# Patient Record
Sex: Female | Born: 1960
Health system: Southern US, Community
[De-identification: ages and names within clinical notes are randomized; demographics above are authoritative.]

## PROBLEM LIST (undated history)

## (undated) DIAGNOSIS — F32A Depression, unspecified: Secondary | ICD-10-CM

## (undated) DIAGNOSIS — I10 Essential (primary) hypertension: Secondary | ICD-10-CM

## (undated) DIAGNOSIS — E079 Disorder of thyroid, unspecified: Secondary | ICD-10-CM

## (undated) DIAGNOSIS — M1990 Unspecified osteoarthritis, unspecified site: Secondary | ICD-10-CM

## (undated) DIAGNOSIS — I639 Cerebral infarction, unspecified: Secondary | ICD-10-CM

## (undated) DIAGNOSIS — K635 Polyp of colon: Secondary | ICD-10-CM

## (undated) DIAGNOSIS — E039 Hypothyroidism, unspecified: Secondary | ICD-10-CM

## (undated) DIAGNOSIS — E785 Hyperlipidemia, unspecified: Secondary | ICD-10-CM

## (undated) DIAGNOSIS — K2970 Gastritis, unspecified, without bleeding: Secondary | ICD-10-CM

## (undated) DIAGNOSIS — Z1371 Encounter for nonprocreative screening for genetic disease carrier status: Secondary | ICD-10-CM

## (undated) DIAGNOSIS — C801 Malignant (primary) neoplasm, unspecified: Secondary | ICD-10-CM

## (undated) DIAGNOSIS — M3500 Sicca syndrome, unspecified: Secondary | ICD-10-CM

## (undated) HISTORY — DX: Sjogren syndrome, unspecified: M35.00

## (undated) HISTORY — DX: Hyperlipidemia, unspecified: E78.5

## (undated) HISTORY — PX: ABDOMINAL HYSTERECTOMY: SHX81

## (undated) HISTORY — DX: Cerebral infarction, unspecified: I63.9

## (undated) HISTORY — DX: Malignant (primary) neoplasm, unspecified: C80.1

## (undated) HISTORY — PX: TONSILLECTOMY: SUR1361

## (undated) HISTORY — DX: Disorder of thyroid, unspecified: E07.9

## (undated) HISTORY — DX: Gastritis, unspecified, without bleeding: K29.70

## (undated) HISTORY — DX: Essential (primary) hypertension: I10

## (undated) HISTORY — PX: BREAST CYST ASPIRATION: SHX578

## (undated) HISTORY — DX: Polyp of colon: K63.5

---

## 1999-02-26 ENCOUNTER — Other Ambulatory Visit: Admission: RE | Admit: 1999-02-26 | Discharge: 1999-02-26 | Payer: Self-pay | Admitting: Obstetrics & Gynecology

## 1999-06-05 ENCOUNTER — Ambulatory Visit (HOSPITAL_COMMUNITY): Admission: RE | Admit: 1999-06-05 | Discharge: 1999-06-05 | Payer: Self-pay | Admitting: Obstetrics & Gynecology

## 2000-11-23 ENCOUNTER — Other Ambulatory Visit: Admission: RE | Admit: 2000-11-23 | Discharge: 2000-11-23 | Payer: Self-pay | Admitting: Obstetrics & Gynecology

## 2000-11-30 ENCOUNTER — Emergency Department (HOSPITAL_COMMUNITY): Admission: EM | Admit: 2000-11-30 | Discharge: 2000-11-30 | Payer: Self-pay | Admitting: Emergency Medicine

## 2000-11-30 ENCOUNTER — Encounter: Payer: Self-pay | Admitting: Emergency Medicine

## 2001-04-20 ENCOUNTER — Encounter: Admission: RE | Admit: 2001-04-20 | Discharge: 2001-04-20 | Payer: Self-pay | Admitting: Obstetrics & Gynecology

## 2001-04-20 ENCOUNTER — Encounter: Payer: Self-pay | Admitting: Obstetrics & Gynecology

## 2001-10-28 ENCOUNTER — Encounter: Admission: RE | Admit: 2001-10-28 | Discharge: 2001-10-28 | Payer: Self-pay | Admitting: Obstetrics & Gynecology

## 2001-10-28 ENCOUNTER — Encounter: Payer: Self-pay | Admitting: Obstetrics & Gynecology

## 2001-11-30 ENCOUNTER — Other Ambulatory Visit: Admission: RE | Admit: 2001-11-30 | Discharge: 2001-11-30 | Payer: Self-pay | Admitting: Obstetrics & Gynecology

## 2002-07-12 ENCOUNTER — Emergency Department (HOSPITAL_COMMUNITY): Admission: EM | Admit: 2002-07-12 | Discharge: 2002-07-13 | Payer: Self-pay | Admitting: Emergency Medicine

## 2002-08-31 ENCOUNTER — Encounter: Payer: Self-pay | Admitting: Obstetrics & Gynecology

## 2002-08-31 ENCOUNTER — Ambulatory Visit (HOSPITAL_COMMUNITY): Admission: RE | Admit: 2002-08-31 | Discharge: 2002-08-31 | Payer: Self-pay | Admitting: Obstetrics & Gynecology

## 2002-09-01 ENCOUNTER — Encounter: Payer: Self-pay | Admitting: Obstetrics & Gynecology

## 2002-09-01 ENCOUNTER — Encounter: Admission: RE | Admit: 2002-09-01 | Discharge: 2002-09-01 | Payer: Self-pay | Admitting: Obstetrics & Gynecology

## 2002-12-05 ENCOUNTER — Encounter: Payer: Self-pay | Admitting: Obstetrics & Gynecology

## 2002-12-05 ENCOUNTER — Encounter: Admission: RE | Admit: 2002-12-05 | Discharge: 2002-12-05 | Payer: Self-pay | Admitting: Obstetrics & Gynecology

## 2002-12-06 ENCOUNTER — Other Ambulatory Visit: Admission: RE | Admit: 2002-12-06 | Discharge: 2002-12-06 | Payer: Self-pay | Admitting: Obstetrics & Gynecology

## 2002-12-21 ENCOUNTER — Encounter (INDEPENDENT_AMBULATORY_CARE_PROVIDER_SITE_OTHER): Payer: Self-pay | Admitting: *Deleted

## 2002-12-21 ENCOUNTER — Encounter (INDEPENDENT_AMBULATORY_CARE_PROVIDER_SITE_OTHER): Payer: Self-pay | Admitting: Specialist

## 2002-12-21 ENCOUNTER — Inpatient Hospital Stay (HOSPITAL_COMMUNITY): Admission: RE | Admit: 2002-12-21 | Discharge: 2002-12-24 | Payer: Self-pay | Admitting: Obstetrics & Gynecology

## 2004-03-04 ENCOUNTER — Encounter: Admission: RE | Admit: 2004-03-04 | Discharge: 2004-03-04 | Payer: Self-pay | Admitting: Obstetrics & Gynecology

## 2004-06-11 ENCOUNTER — Emergency Department (HOSPITAL_COMMUNITY): Admission: EM | Admit: 2004-06-11 | Discharge: 2004-06-11 | Payer: Self-pay | Admitting: Emergency Medicine

## 2004-06-11 IMAGING — CR DG ABDOMEN ACUTE W/ 1V CHEST
3 series · 3 of 3 positions shown · non-contrast
Comparison: None

CLINICAL DATA: Abdominal pain, nausea, and vomiting

ABDOMEN SERIES - 2 VIEW & CHEST - 1 VIEW

[view not recorded (1 of 3)]
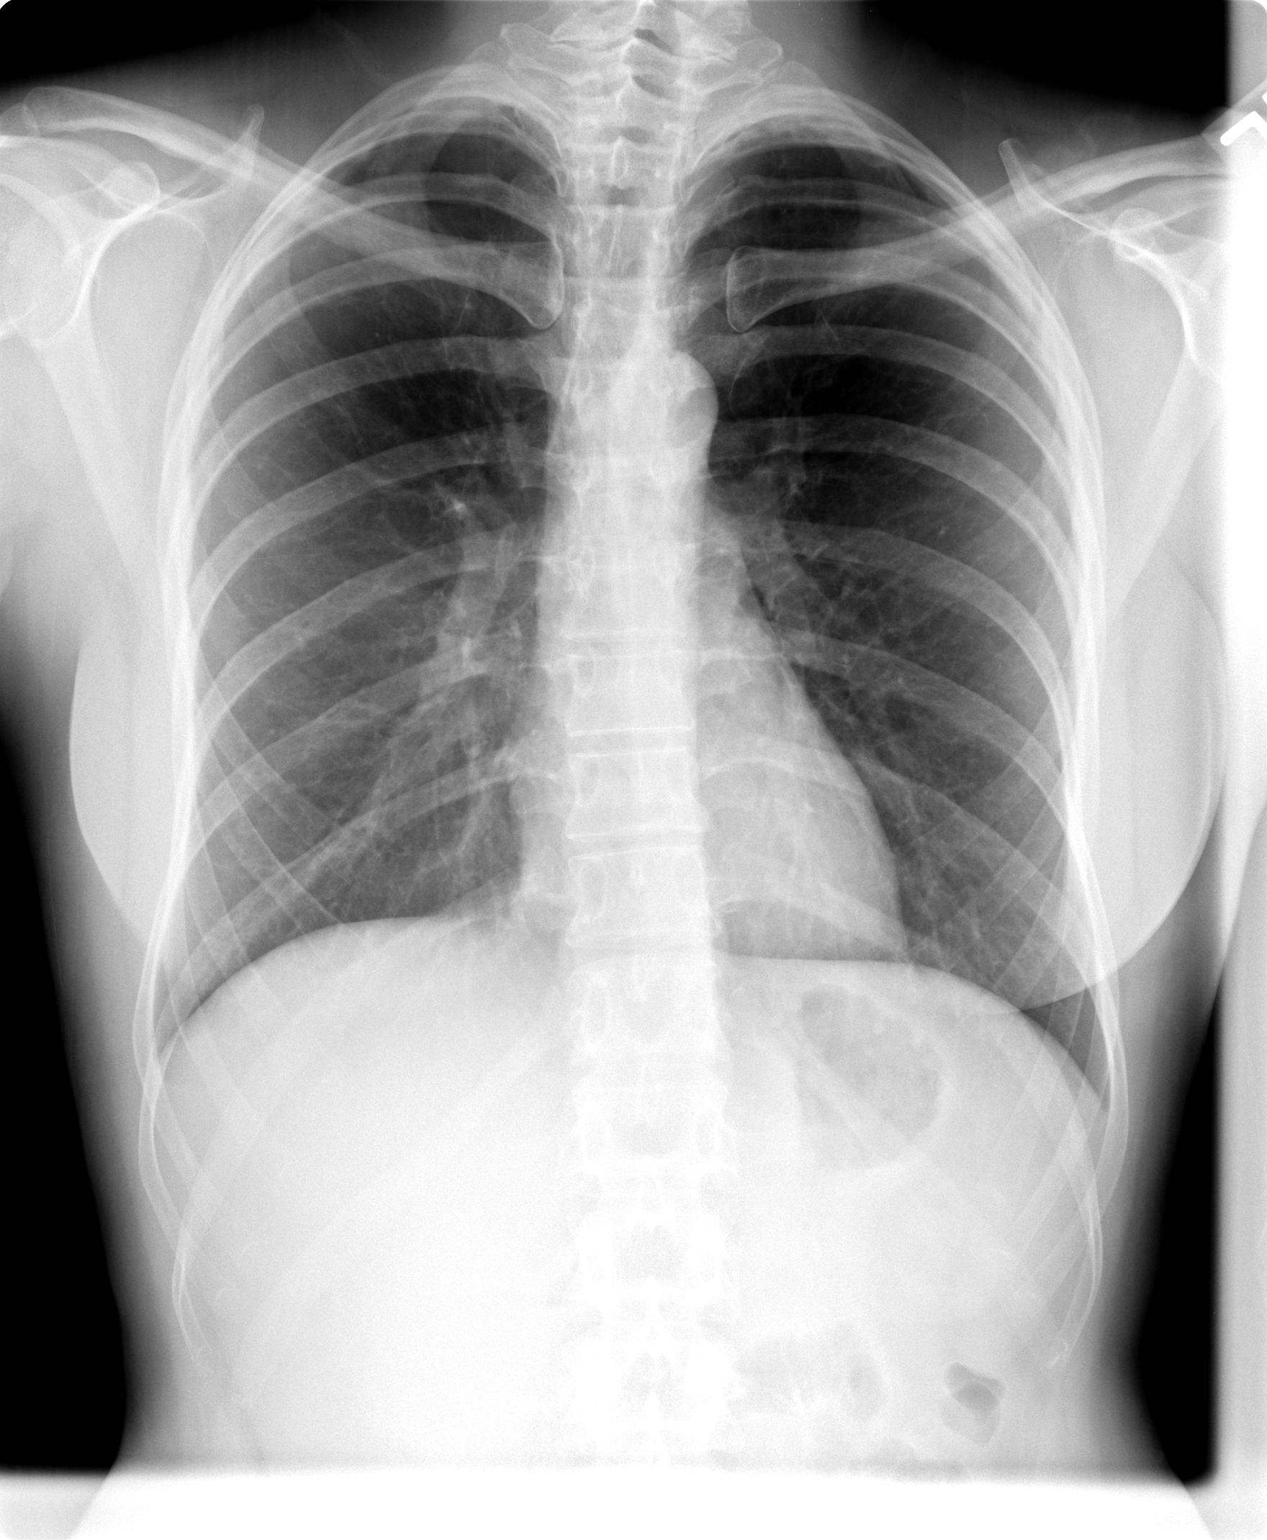

[view not recorded (2 of 3)]
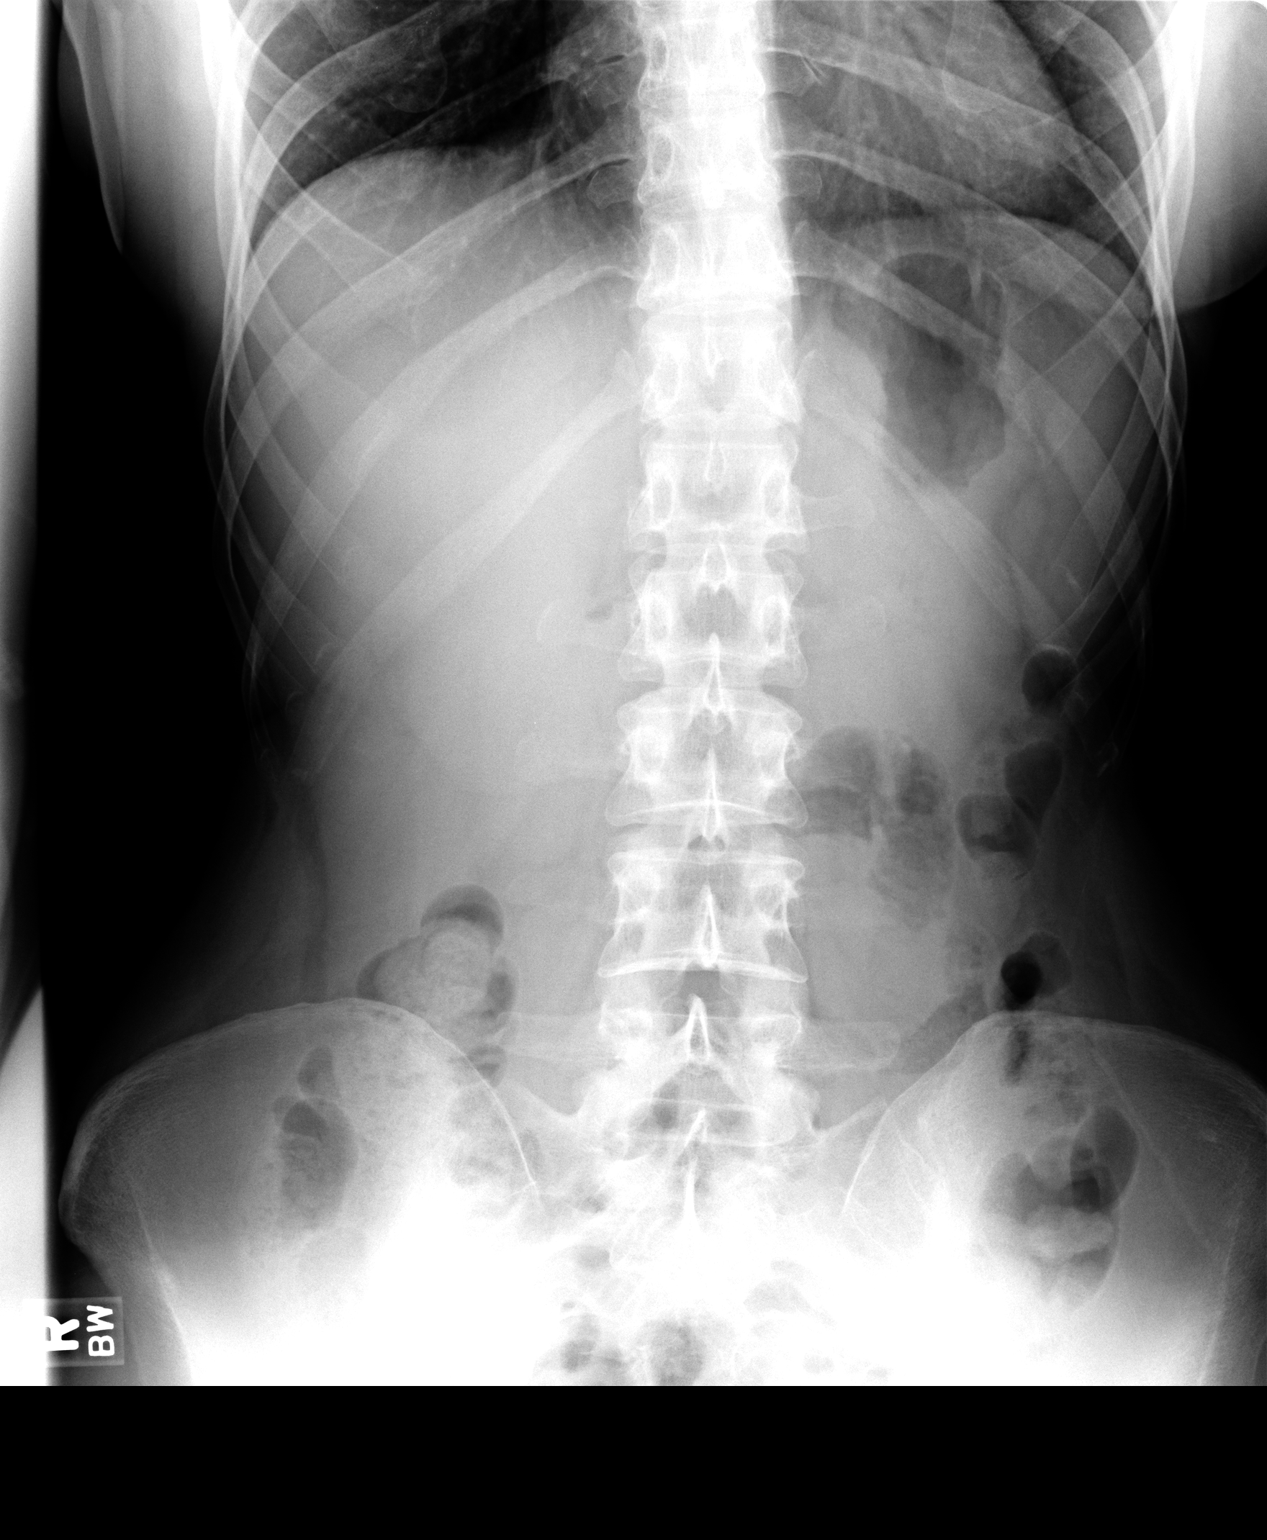

[view not recorded (3 of 3)]
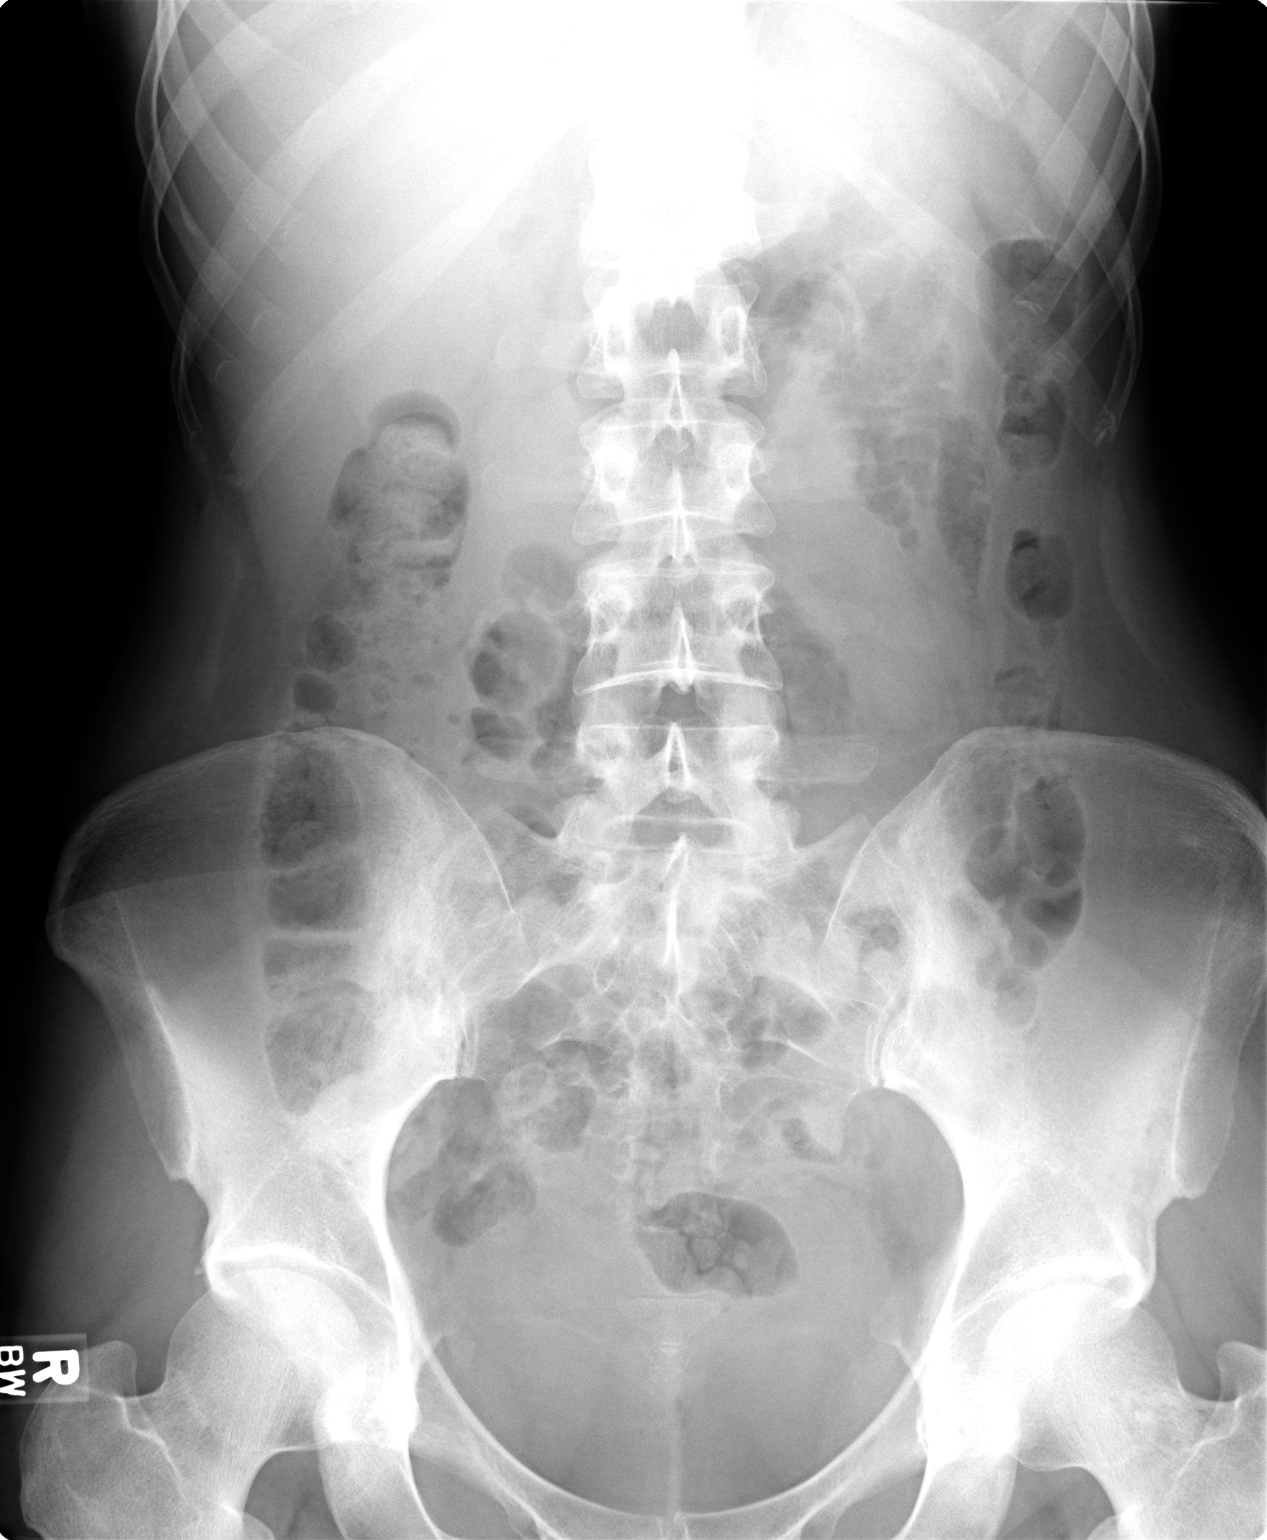

[3 of 3 positions shown; findings below may reference images not displayed]

FINDINGS: Lungs are clear. No acute pneumonia, effusion, edema, pneumothorax. Heart size is
normal. No free air.

Scattered air and stool are evident throughout the bowel. No obstruction. No abnormal
calcifications.

IMPRESSION

1. No acute findings

## 2004-10-24 ENCOUNTER — Encounter: Admission: RE | Admit: 2004-10-24 | Discharge: 2004-10-24 | Payer: Self-pay | Admitting: *Deleted

## 2004-10-24 IMAGING — CR DG FOOT COMPLETE 3+V*R*
2 series · 2 of 2 positions shown · non-contrast
Comparison: none

HISTORY: Right foot pain, question stress fracture

RIGHT FOOT 3 VIEWS:
No fracture, dislocation, or bone destruction.
Joint spaces preserved.  
Mineralization normal.

[view not recorded (1 of 2)]
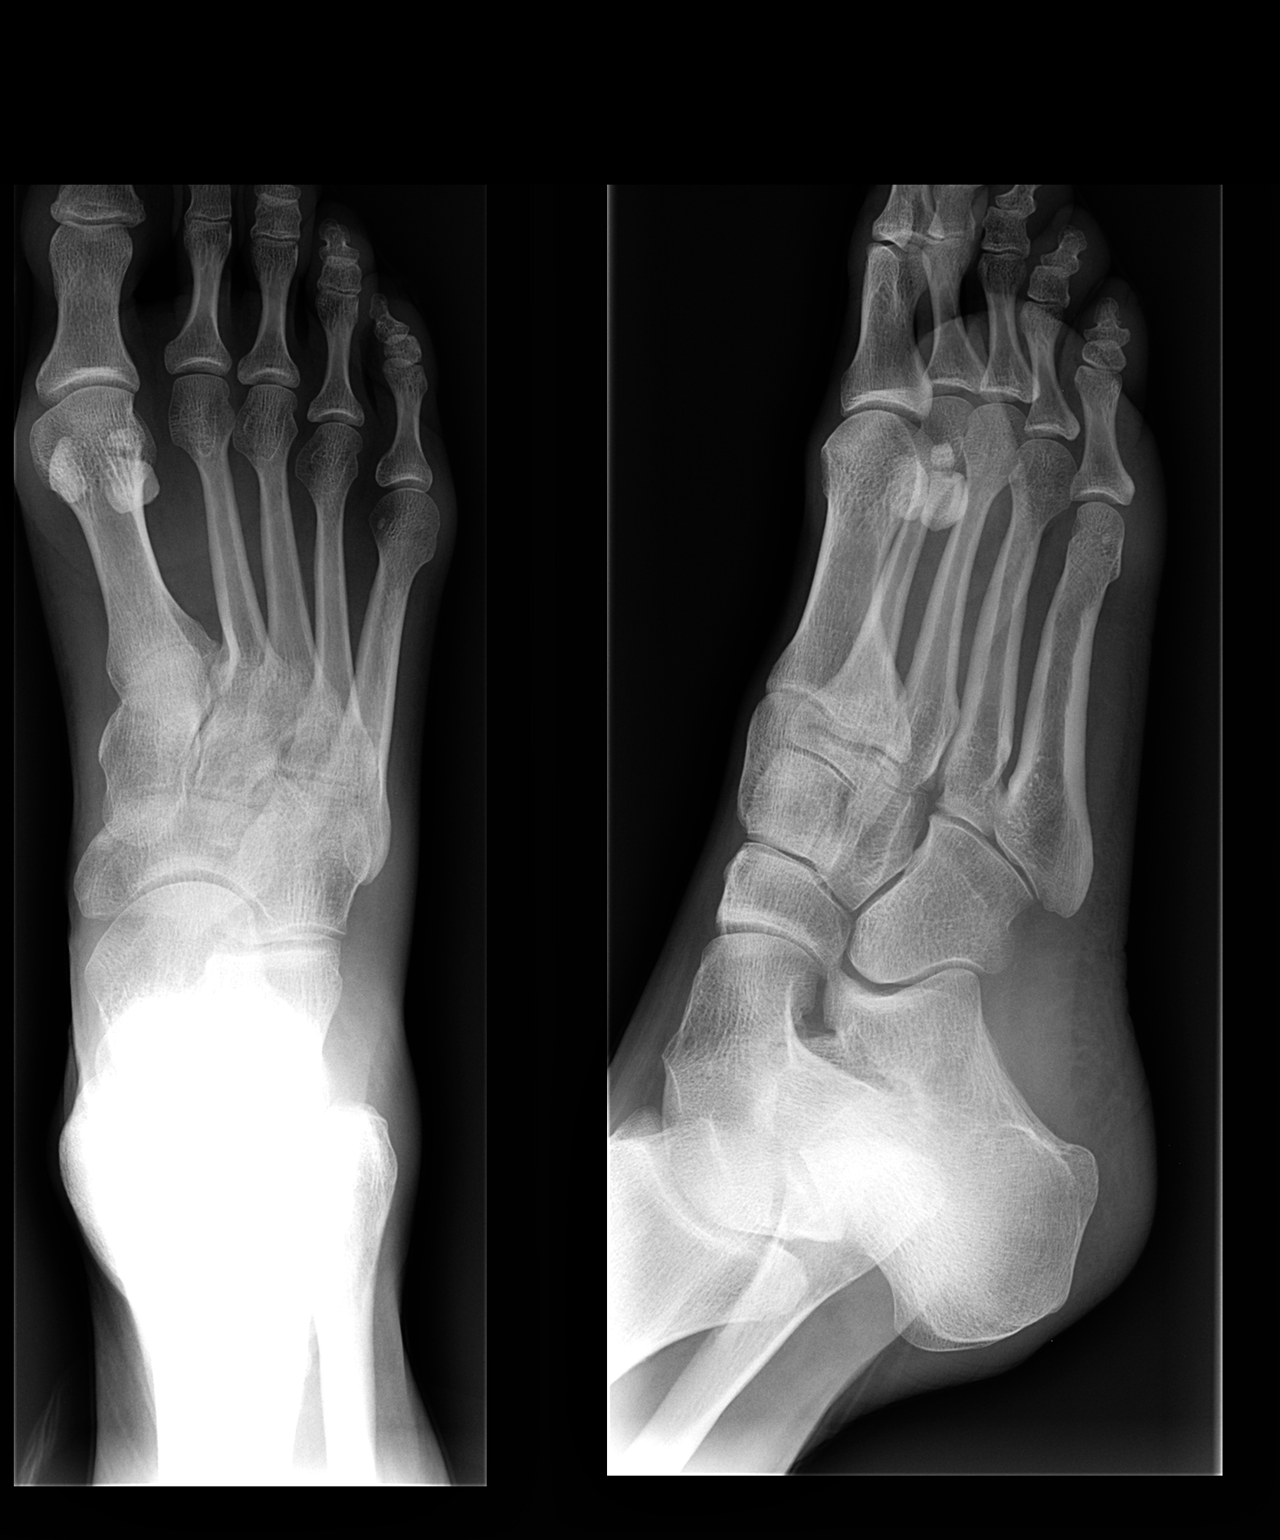

[view not recorded (2 of 2)]
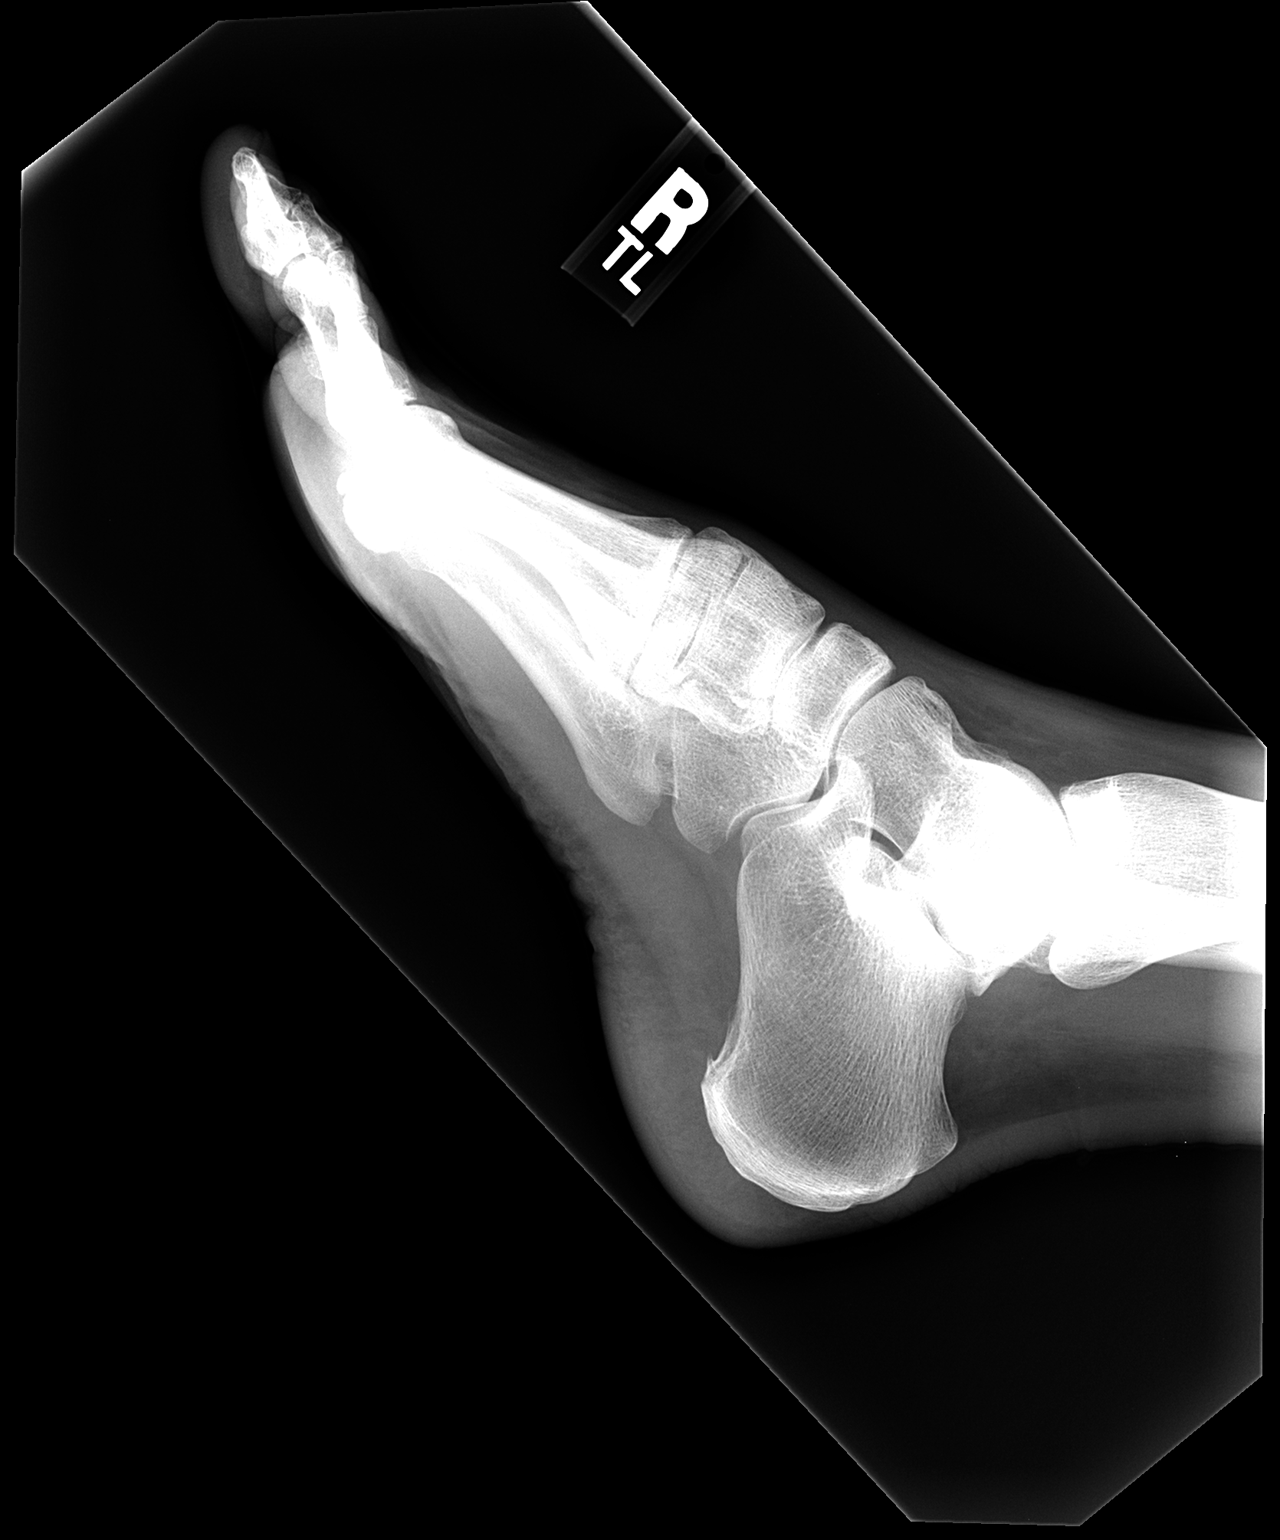

[2 of 2 positions shown; findings below may reference images not displayed]

IMPRESSION: No acute abnormalities.

## 2005-03-10 ENCOUNTER — Encounter: Admission: RE | Admit: 2005-03-10 | Discharge: 2005-03-10 | Payer: Self-pay | Admitting: Obstetrics & Gynecology

## 2005-06-14 HISTORY — PX: COLONOSCOPY: SHX174

## 2005-06-23 ENCOUNTER — Ambulatory Visit (HOSPITAL_COMMUNITY): Admission: RE | Admit: 2005-06-23 | Discharge: 2005-06-23 | Payer: Self-pay | Admitting: Gastroenterology

## 2006-04-01 ENCOUNTER — Encounter: Admission: RE | Admit: 2006-04-01 | Discharge: 2006-04-01 | Payer: Self-pay | Admitting: Obstetrics & Gynecology

## 2006-04-01 IMAGING — MG MM DIAGNOSTIC BILATERAL
5 series · 5 of 5 positions shown · non-contrast
Comparison: [DATE].

DG DIAGNOSTIC BILATERAL
Bilateral CC and MLO view(s) were taken.

RIGHT BREAST ULTRASOUND
DIGITAL BILATERAL  DIAGNOSTIC MAMMOGRAM WITH CAD AND RIGHT BREAST ULTRASOUND:
CLINICAL DATA: Questioned palpable finding right breast 2 o'clock.

[R CC]
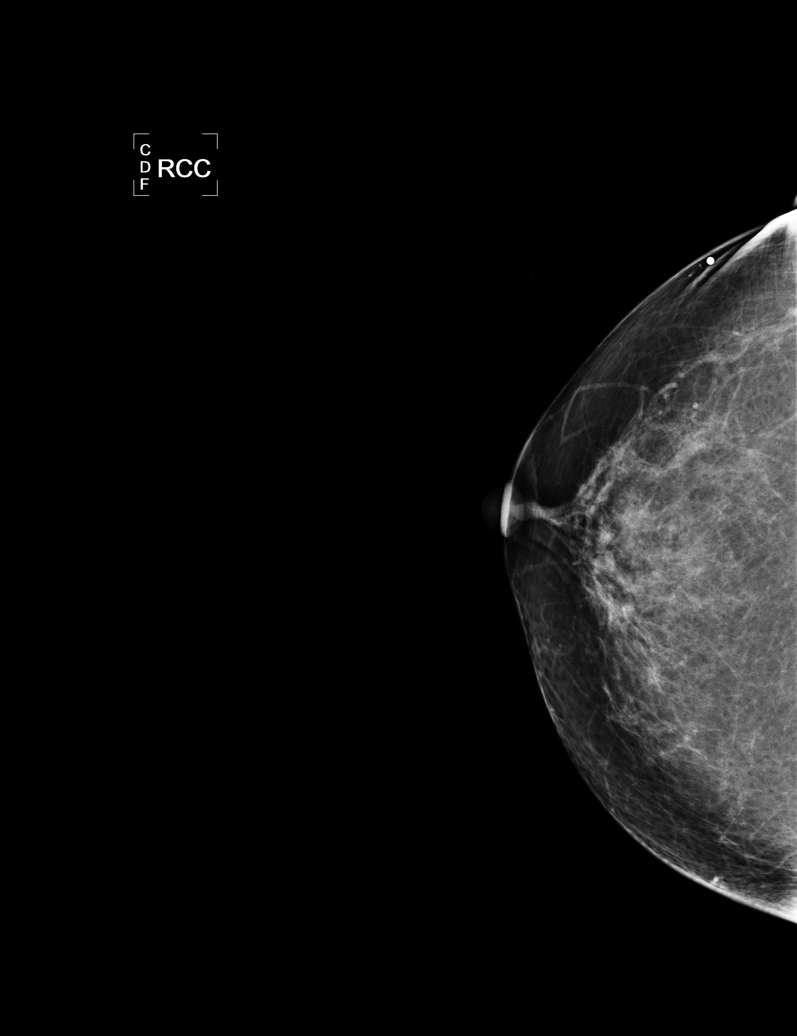

[L CC]
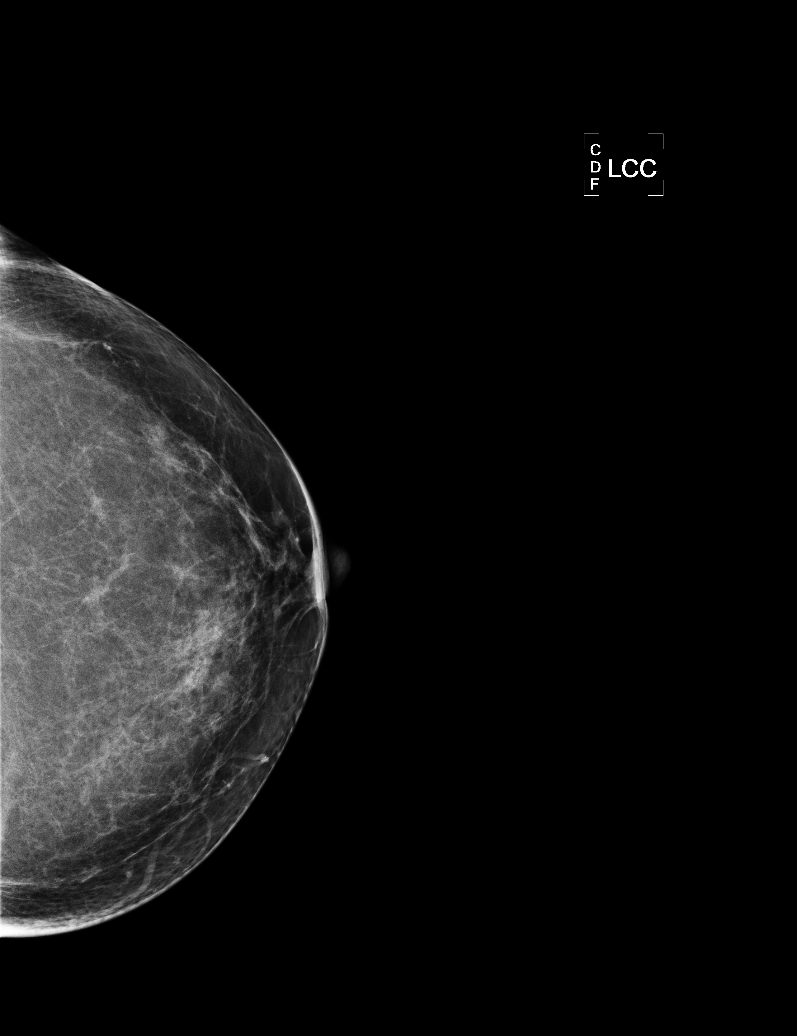

[L MLO]
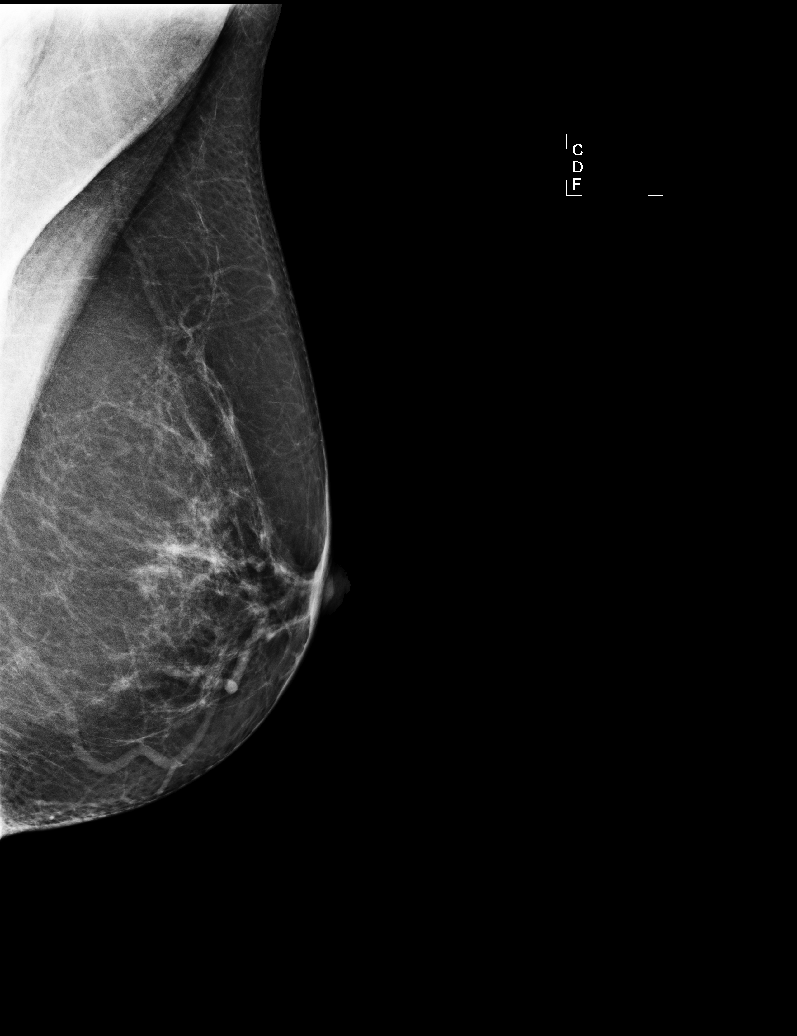

[R MLO]
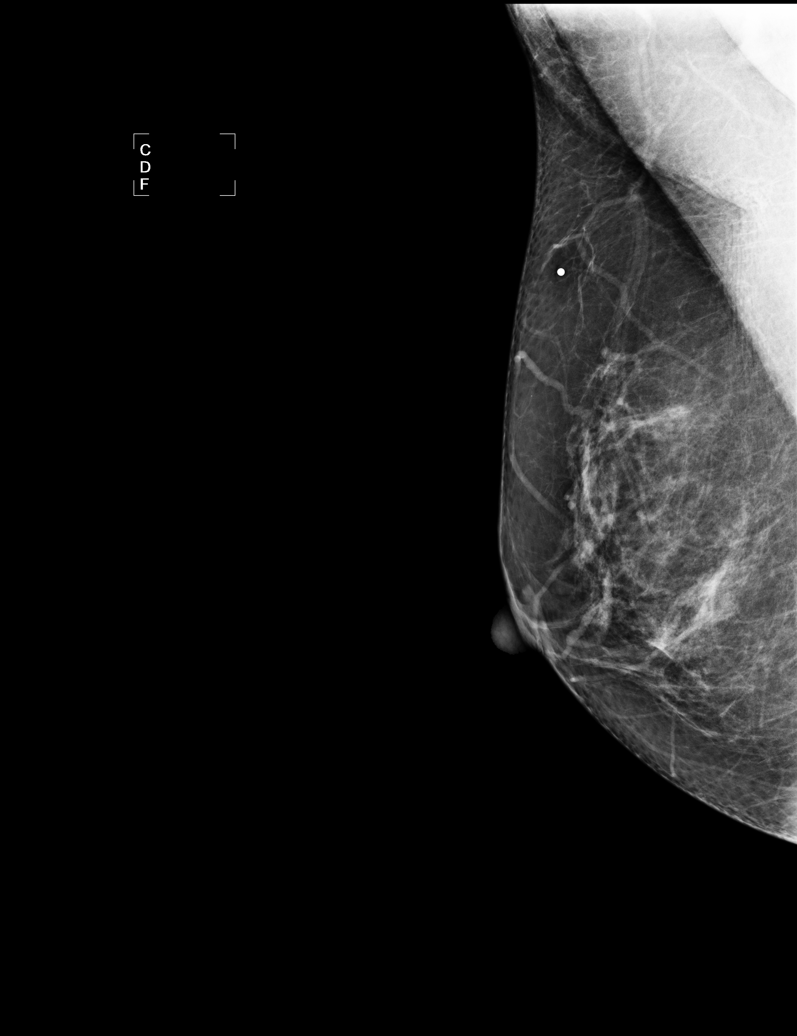

[R TAN]
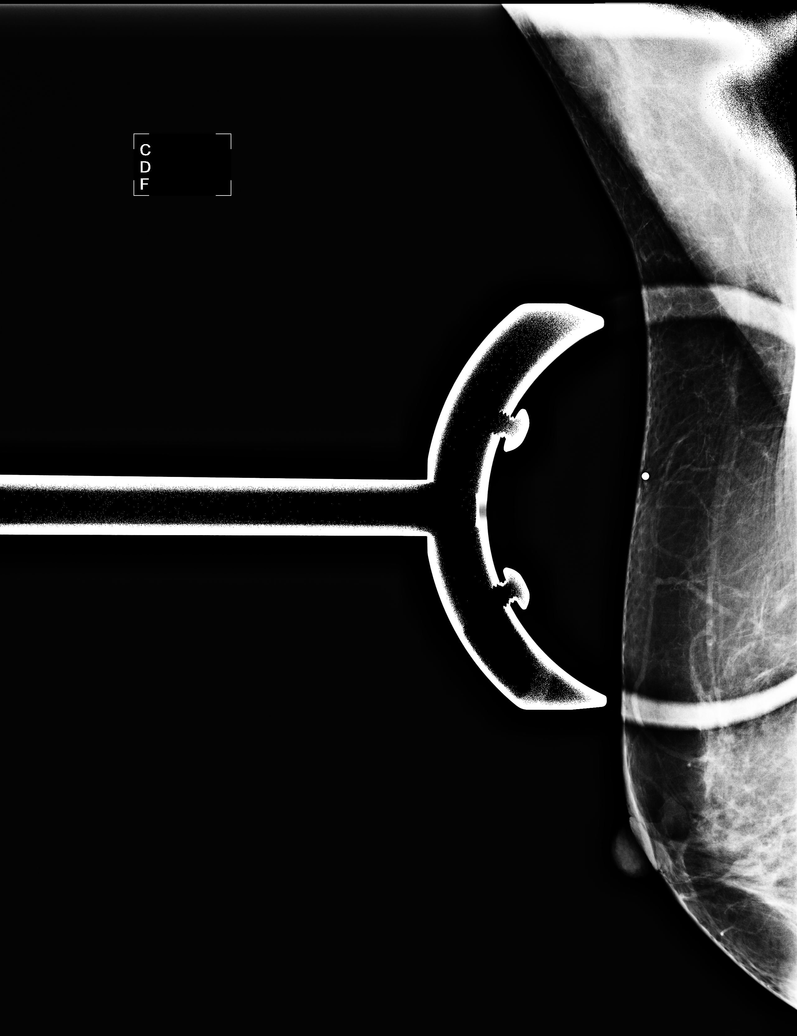

[5 of 5 positions shown; findings below may reference images not displayed]

The breast parenchyma demonstrates scattered fibroglandular densities.  No suspicious mass, 
calcification, or architectural distortion is seen.

On physical examination, I palpate a ridge of normal-appearing tissue in the right upper outer 
quadrant 2 o'clock location which appears normal on ultrasound.
IMPRESSION: No mammographic or sonographic evidence for malignancy in the area of the questioned palpable 
finding right breast 2 o'clock location.  The patient will be due for screening mammography in one 
year.  Findings and recommendations discussed with the patient and provided in written form at the 
time of the study.

ASSESSMENT: Negative - BI-RADS 1

Routine screening mammogram in 1 year.
,

## 2007-08-09 ENCOUNTER — Ambulatory Visit (HOSPITAL_COMMUNITY): Admission: RE | Admit: 2007-08-09 | Discharge: 2007-08-09 | Payer: Self-pay | Admitting: Obstetrics & Gynecology

## 2008-03-20 ENCOUNTER — Ambulatory Visit: Payer: Self-pay | Admitting: Family Medicine

## 2008-04-26 ENCOUNTER — Ambulatory Visit (HOSPITAL_COMMUNITY): Admission: RE | Admit: 2008-04-26 | Discharge: 2008-04-26 | Payer: Self-pay | Admitting: Family Medicine

## 2009-05-29 ENCOUNTER — Ambulatory Visit: Payer: Self-pay | Admitting: Family Medicine

## 2009-06-05 ENCOUNTER — Ambulatory Visit (HOSPITAL_COMMUNITY): Admission: RE | Admit: 2009-06-05 | Discharge: 2009-06-05 | Payer: Self-pay | Admitting: Obstetrics & Gynecology

## 2010-07-03 ENCOUNTER — Ambulatory Visit (HOSPITAL_COMMUNITY): Admission: RE | Admit: 2010-07-03 | Discharge: 2010-07-03 | Payer: Self-pay | Admitting: Obstetrics & Gynecology

## 2010-07-03 IMAGING — MG MM DIGITAL SCREENING
2 series · 2 of 2 positions shown · non-contrast
Comparison: Prior studies.

DG SCREEN MAMMOGRAM BILATERAL
Bilateral CC and MLO view(s) were taken.
Technologist: DELTON.(DELTON)(DELTON)

DIGITAL SCREENING MAMMOGRAM WITH CAD:

[R CC]
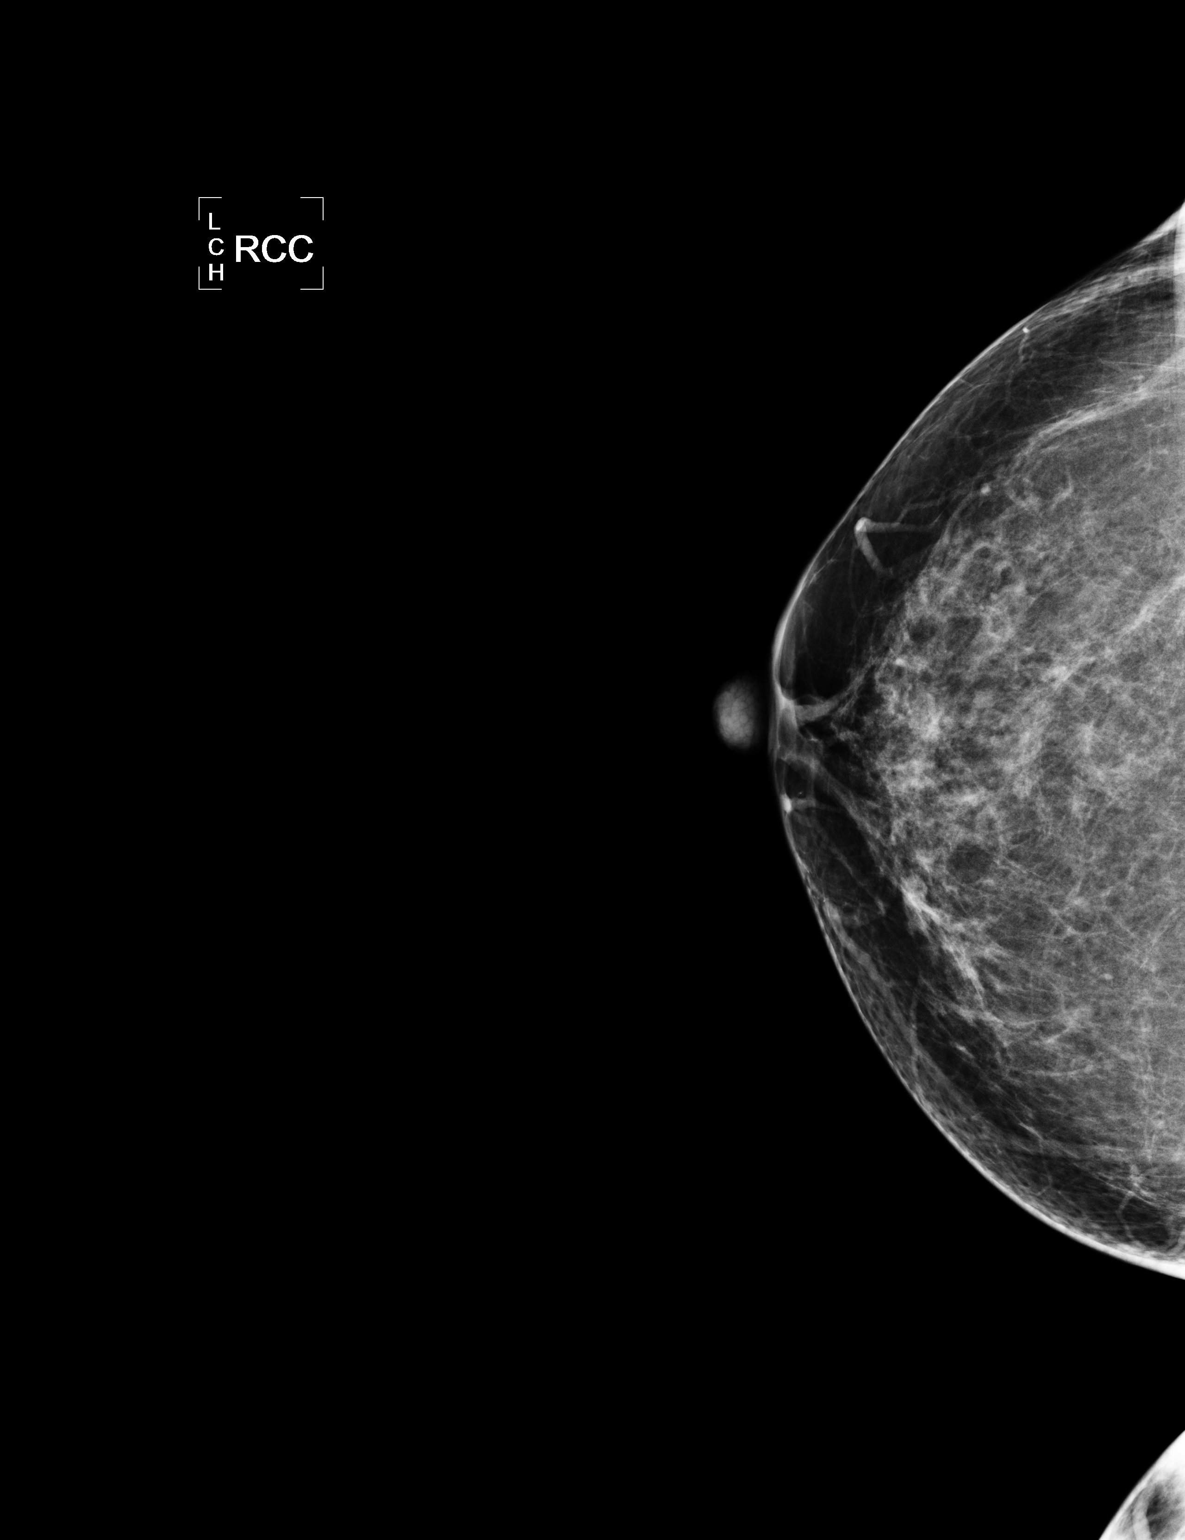

[L CC]
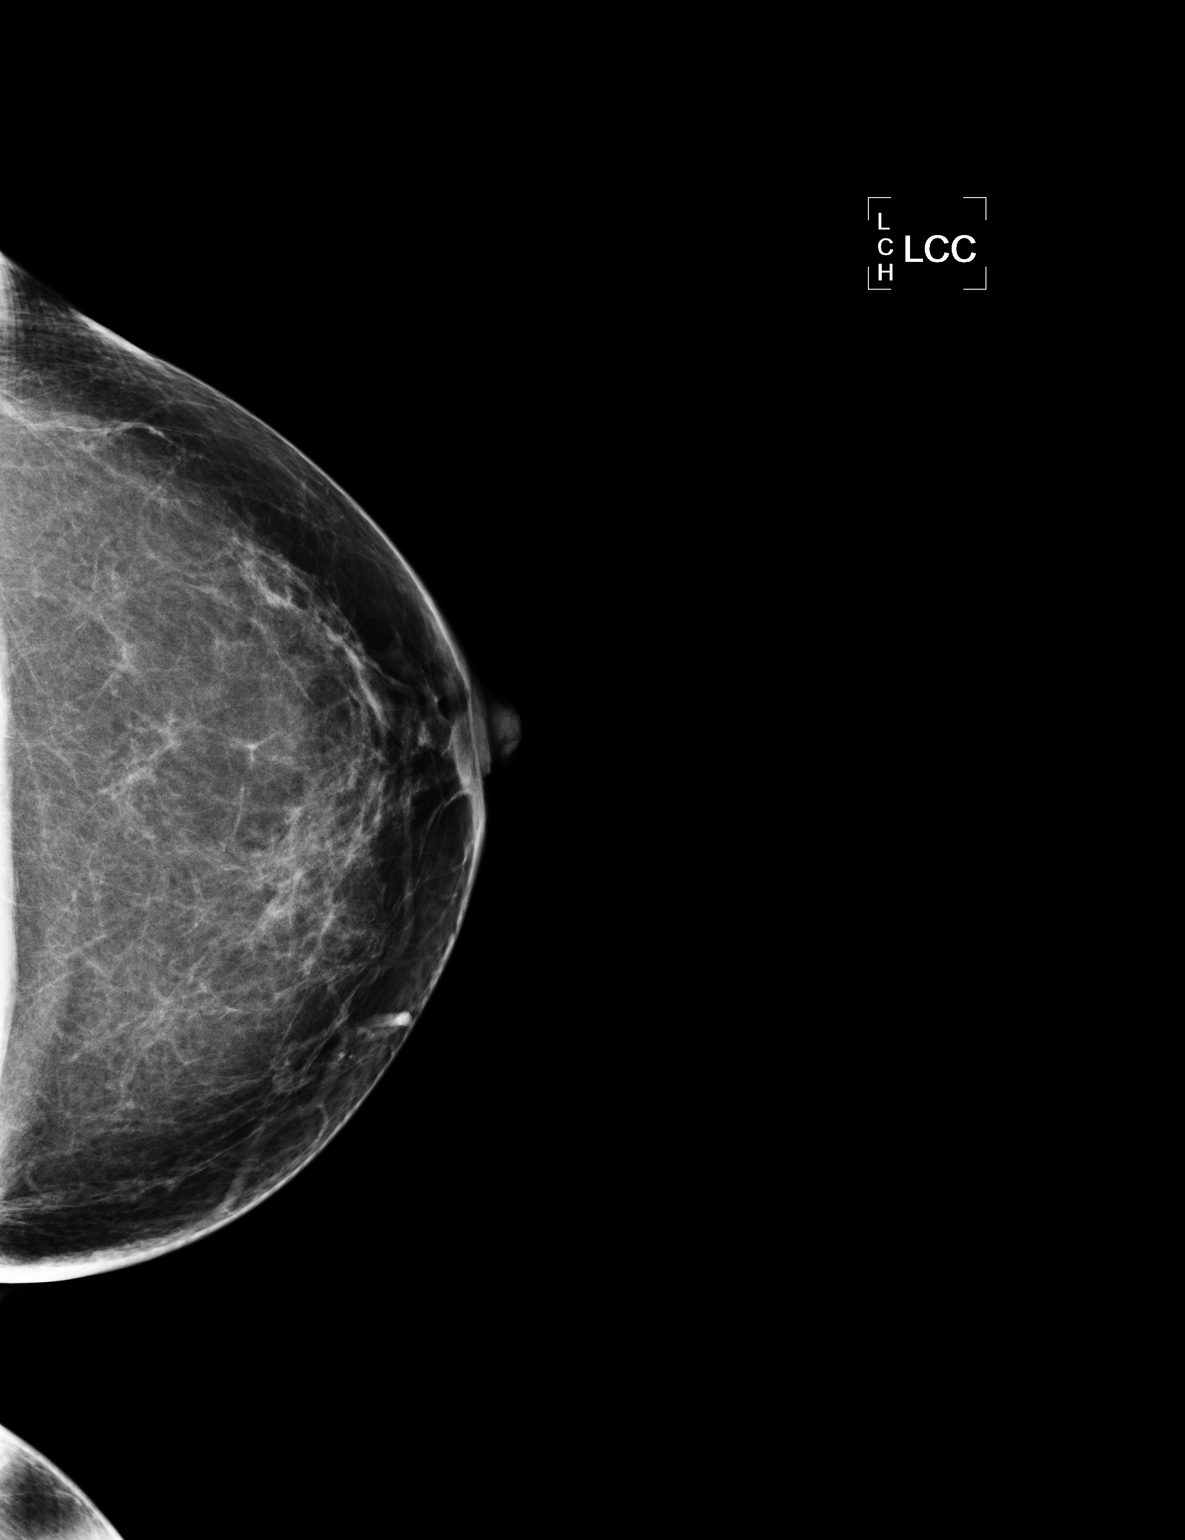

[2 of 2 positions shown; findings below may reference images not displayed]

There are scattered fibroglandular densities.  There is no dominant mass, architectural distortion 
or calcification to suggest malignancy.

Images were processed with CAD.
IMPRESSION: No mammographic evidence of malignancy.  Suggest yearly screening mammography.

A result letter of this screening mammogram will be mailed directly to the patient.

ASSESSMENT: Negative - BI-RADS 1

Screening mammogram in 1 year.
,

## 2010-10-04 ENCOUNTER — Encounter: Payer: Self-pay | Admitting: Obstetrics & Gynecology

## 2010-11-27 ENCOUNTER — Ambulatory Visit (INDEPENDENT_AMBULATORY_CARE_PROVIDER_SITE_OTHER): Payer: Commercial Managed Care - PPO | Admitting: Family Medicine

## 2010-11-27 DIAGNOSIS — J209 Acute bronchitis, unspecified: Secondary | ICD-10-CM

## 2011-01-30 NOTE — Discharge Summary (Signed)
NAME:  Dorothy Wood, Dorothy Wood                        ACCOUNT NO.:  [id number]   MEDICAL RECORD NO.:  [id number]                   PATIENT TYPE:  INP   LOCATION:  9137                                 FACILITY:  [facility]   PHYSICIAN:  Gerrit Friends. Aldona Bar, M.D.                DATE OF BIRTH:  1961-06-07   DATE OF ADMISSION:  12/21/2002  DATE OF DISCHARGE:  12/24/2002                                 DISCHARGE SUMMARY   DISCHARGE DIAGNOSES:  Strong suspicion for early ovarian cancer.   PROCEDURE:  Total abdominal hysterectomy, bilateral salpingo-oophorectomy  and peritoneal washings through a midline incision.   HISTORY OF PRESENT ILLNESS:  This 50 year old patient presented with  abdominal pain in December of 2003. Evaluation at that time consisted of an  ultrasound which was normal and a CA125 which was slightly elevated. The  CA125 was repeated six weeks later and was even more slightly elevated. The  value had gone from 34 to 39. At this time, the patient's examination was  within normal limits but her abdominal symptoms seemed to come and go. She  was seen in March of 2004 and her examination at that time was normal.  However, her CA125 was now 5. After discussion with several oncologists,  the decision was made to proceed with total abdominal hysterectomy,  bilateral salpingo-oophorectomy and peritoneal washings and possible  omentectomy with a preoperative diagnosis and concern for a very early  ovarian cancer.   HOSPITAL COURSE:  The patient was taken to the OR after a normal  preoperative workup on December 21, 2002 at which time she underwent total  abdominal hysterectomy, bilateral salpingo-oophorectomy and peritoneal  washings. The omentum was clean. Cytology and pathology reports are still  pending at this time. There were no obvious abnormalities seen at the time  of surgery. The patient's postoperative course was relatively uncomplicated.  As expected, she did have an ileus that persisted  until the evening of the  second postoperative day. On the morning of the third postoperative day, she  was ambulating well, tolerating her regular diet well and having normal  bowel and bladder function. She was afebrile, wound was clean and dry and  vital signs were stable. She was deemed ready for discharge.   LABORATORY DATA:  Discharge hemoglobin was 10.6 with white count if 9,400  and platelet count of 179,000.   DISCHARGE MEDICATIONS:  Tylox 1-2 every four to six hours as needed pain,  Zofran 4 mg oral dissolving tablets every four to six hours as needed and __  0.05 mg change twice weekly.   FOLLOW UP:  She will return to the office in approximately two days time for  staple removal and to have her wound steri-stripped. She was given all  careful postoperative instructions at the time of discharge and understood  all instructions well.   CONDITION ON DISCHARGE:  Improved.  Gerrit Friends. Aldona Bar, M.D.    RMW/MEDQ  D:  12/24/2002  T:  12/25/2002  Job:  [id number]

## 2011-01-30 NOTE — Op Note (Signed)
NAME:  Dorothy Wood, Dorothy Wood                        ACCOUNT NO.:  [id number]   MEDICAL RECORD NO.:  [id number]                   PATIENT TYPE:  INP   LOCATION:  9309                                 FACILITY:  [facility]   PHYSICIAN:  Gerrit Friends. Aldona Bar, M.D.                DATE OF BIRTH:  03/18/1961   DATE OF PROCEDURE:  12/21/2002  DATE OF DISCHARGE:                                 OPERATIVE REPORT   PREOPERATIVE DIAGNOSIS:  Elevating CA-125, suspicion for ovarian cancer.   POSTOPERATIVE DIAGNOSIS:  Elevating CA-125, suspicion for ovarian cancer.  Pathology pending.   PROCEDURE:  Exploratory laparotomy through a midline incision, peritoneal  washings, total abdominal hysterectomy, bilateral salpingo-oophorectomy.   SURGEON:  Annamaria Helling, M.D.   ASSISTANT:  Lodema Hong, M.D.   ANESTHESIA:  General endotracheal.   PROCEDURE:  The patient was taken to the operating room where after the  satisfactory induction of general endotracheal anesthesia, the patient was  prepped and draped having been placed in the supine position.  A Foley  catheter was inserted as part of the prep.   Once the patient was adequately draped the procedure was begun.  A midline  incision was made from the area below the umbilicus to the suprapubic area  in the midline.  This incision was dissected down sharply to the fascia with  hemostasis completed at each layer.  The fascia was incised in the midline,  midline identified, posterior sheath grasped and opened and peritoneum  subsequently grasped and opened.  At this time, peritoneal washings were  obtained.  The incision was then extended and opened completely.  The  omentum was inspected thoroughly and noted to be completely normal with no  lesions seen.  Other than some adhesions involving the anterior abdominal  wall and the anterior lower segment of the uterus probably secondary to  patient's previous Cesarean section.  No pelvic or lower abdominal  abnormalities  were seen.  The ovaries appeared normal - there were no  abnormalities noted on the surface of either ovary.  The cul-de-sac was free  of any disease.  The appendix was visualized and noted to be completely  normal.  Exploration of the abdomen found the undersurface of the diaphragm  to be free with no nodularity.  Liver edge was likewise free of any  nodularity.  Both kidneys palpated normally.  At this time, the Balfour  retractor was placed and bowel was packed off without difficulty.  Hysterectomy at this time was begun in the usual fashion.  __________ Tresa Endo  clamps were used to elevate the corners of the uterus.  The adhesions  involving the lower anterior abdominal wall and the anterior lower segment  of the uterus were sharply lysed.  The round ligaments were then identified,  suture-secured and opened, and the anterior bladder flap was dissected off  the lower segment of the uterus without difficulty.  The infundibulopelvic  pedicles were then clamped, cut and suture-secured with 0Vicryl suture -  doubly tied.  This was carried out after identification of each ureter.  This removed essentially both tubes and ovaries.  At this time, uterine  artery vessels were skeletonized and curved Heaney clamps were then used and  the uterine artery pedicles were then suture-secured with 0Vicryl suture.  Using straight Heaney clamps bilaterally in a similar fashion, parametrial  tissue was taken down to the area of the cervix.  At this time the  uterosacral pedicles were clamped, cut, suture-secured as well with 0Vicryl  suture and entrance into the vaginal angle was obtained bilaterally and the  specimen was removed in its entirety including uterus, tubes and ovaries  submitted as one specimen.   At this time the vaginal cuff was closed, first the vaginal angles with  figure-of-eight 0Vicryl and thereafter the vaginal cuff itself with 0Vicryl  in interrupted figure-of-eight fashion.  The  pelvis at this time was  profusely irrigated and hemostasis was created.  Again, the ureters were  noted to be normal in their course.  Good hemostasis was achieved.  At this  time the hysterectomy portion of the procedure was felt to be complete.  All  packs were removed.  Retractor was removed and at this point in the  procedure all counts were noted to be correct and no foreign bodies were  noted to be remaining in the abdominal cavity.  The appendix had been  identified previously and was noted to be normal and was left in.  At this  time with the specimen removed, procedure completed, closure of the abdomen  was begun in layers.  The abdominal peritoneum was closed with 2-0 Vicryl in  a running fashion.  This was done as mentioned after all counts were noted  to be correct.  Closure of the fascia was then carried out in an interrupted  fashion using 0Vicryl suture, some used in a figure-of-eight fashion.  Subcutaneous tissue was then irrigated profusely and closure of the skin was  carried out with staples and a pressure dressing was applied.  The patient  at this time was transported to the recovery room in satisfactory condition  having tolerated the procedure well.  Estimated blood loss 150 cc.  All  counts correct times two.  Specimen includes the uterus, tubes and ovaries  submitted as one specimen and peritoneal washings.  The patient was taken to  the recovery room in satisfactory condition.                                               Gerrit Friends. Aldona Bar, M.D.    RMW/MEDQ  D:  12/21/2002  T:  12/22/2002  Job:  [id number]

## 2011-01-30 NOTE — Op Note (Signed)
Dorothy Wood, Dorothy Wood              ACCOUNT NO.:  [id number]   MEDICAL RECORD NO.:  [id number]          PATIENT TYPE:  AMB   LOCATION:  ENDO                         FACILITY:  [facility]   PHYSICIAN:  Bernette Redbird, M.D.   DATE OF BIRTH:  [birth year]   DATE OF PROCEDURE:  06/23/2005  DATE OF DISCHARGE:                                 OPERATIVE REPORT   PROCEDURE PERFORMED:  Colonoscopy.   ENDOSCOPIST:  Florencia Reasons, M.D.   INDICATIONS FOR PROCEDURE:  Follow-up of a prior colon polyp removed three  and a half years ago.   FINDINGS:  Normal exam to the terminal ileum.   DESCRIPTION OF PROCEDURE:  The nature, purpose and risks of the procedure  were familiar to the patient from prior examination and she provided written  consent.  Sedation prior to and during the course of the procedure totaled  fentanyl 100 mcg and Versed 10 mg IV without arrhythmias or desaturation.   The Olympus adjustable tension pediatric video colonoscope was advanced with  moderate looping around a somewhat angulated sigmoid region and then fairly  easily to the terminal ileum which had a normal appearance and pullback was  then performed.  The appendiceal orifice was also visualized.  The quality  of the prep was excellent and it is felt that all areas were well seen.   This was a normal examination.  No polyps, cancer, colitis, vascular  malformations or diverticulosis were noted.  Retroflexion in the rectum was  not attempted due to a small rectal ampulla but reinspection  of the rectum  up to 20 or 30 cm was unremarkable.  I believe I was able to identify the  scar from the previous polypectomy site.  There was no evidence of residual  or locally recurrent polyp tissue in that area.   No biopsies were obtained. The patient tolerated the procedure well and  there were no apparent complications.   IMPRESSION:  Normal colonoscopy in a patient with a prior history of colonic  adenoma having been removed  (V12.72).   PLAN:  Repeat colonoscopy in five years.           ______________________________  Bernette Redbird, M.D.     RB/MEDQ  D:  06/23/2005  T:  06/23/2005  Job:  [id number]   cc:   Sharlot Gowda, M.D.  Fax: [phone]   Gerrit Friends. Aldona Bar, M.D.  Fax: [phone]

## 2011-01-30 NOTE — H&P (Signed)
NAME:  Dorothy Wood, VIERRA                        ACCOUNT NO.:  [id number]   MEDICAL RECORD NO.:  [id number]                   PATIENT TYPE:  INP   LOCATION:  NA                                   FACILITY:  [facility]   PHYSICIAN:  Gerrit Friends. Aldona Bar, M.D.                DATE OF BIRTH:  Feb 21, 1961   DATE OF ADMISSION:  12/21/2002  DATE OF DISCHARGE:                                HISTORY & PHYSICAL   HISTORY:  The patient is a 50 year old married white female admitted for a  total abdominal hysterectomy and bilateral salpingo-oophorectomy with a  diagnosis of high suspicion of early ovarian cancer.  This patient presented  in December 2003 with an episode of right lower quadrant pain.  Evaluation  consisted of an ultrasound which was essentially within normal limits and a  CA125 which came back at 34.  This value was repeated approximately six to  eight weeks later and was 39.  Repeat again in late March 2004 was 51.  Because of her elevating CA125 and the suspicion of very early ovarian  cancer, this patient is admitted for total abdominal hysterectomy and  bilateral salpingo-oophorectomy with peritoneal washings and whatever else  is felt to be necessary at the time of surgery.   The patient has completed her childbearing.  She is a gravida 2 para 2.  Her  husband has had a vasectomy.  She is in otherwise good health.  Her recent  cervical cytology as well as a recent mammogram were both within normal  limits.   The patient does have controlled hypothyroidism - on Synthroid 0.025 mg  daily.  She is on no other gynecologic medications.  She essentially is  totally asymptomatic but because of the suspicion that the elevating CA125  has created, this procedure is now being carried out with total informed  consent.   ALLERGIES:  No known allergies.   PAST MEDICAL HISTORY:  1. Previous operative procedures:  None.  2. Illnesses:  None with the exception of above.  3. The patient is a  nonsmoker.   SOCIAL HISTORY AND FAMILY HISTORY:  Noncontributory.   REVIEW OF SYSTEMS:  Unremarkable.   PHYSICAL EXAMINATION AT THE TIME OF ADMISSION:  GENERAL:  Finds a well-  developed female.  VITAL SIGNS:  Weight 122, blood pressure 110/60, height 5 feet 6 inches,  temperature 98.2, pulse 80 and regular, respirations 18 and regular.  HEENT:  Negative.  Thyroid not enlarged.  CHEST:  Clear.  CARDIOVASCULAR:  Regular rhythm, no murmur.  BREASTS:  Negative.  ABDOMEN:  Unremarkable.  Uterus normal size, mobile, nontender.  Adnexal  area is unremarkable.  The patient has had previous cesarean sections.  NEUROLOGIC:  Physiologic.  EXTREMITIES:  Negative.   IMPRESSION:  Suspicion for ovarian cancer as manifested by rising CA125  levels.   PLAN:  The patient will undergo a midline incision, exploration of abdomen  and peritoneal washings, and total abdominal hysterectomy with bilateral  salpingo-oophorectomy.  Omentectomy will be carried out as well if indeed  lesions are found in the omentum.  Appendectomy will be considered if the  appendix is abnormal.                                               Gerrit Friends. Aldona Bar, M.D.    RMW/MEDQ  D:  12/20/2002  T:  12/20/2002  Job:  [id number]   cc:   Admitting area prior to surgery

## 2011-02-18 ENCOUNTER — Other Ambulatory Visit: Payer: Self-pay | Admitting: Gastroenterology

## 2011-02-18 LAB — HM COLONOSCOPY

## 2011-03-16 ENCOUNTER — Encounter: Payer: Self-pay | Admitting: Family Medicine

## 2011-07-07 ENCOUNTER — Other Ambulatory Visit (HOSPITAL_COMMUNITY): Payer: Self-pay | Admitting: Obstetrics & Gynecology

## 2011-07-07 DIAGNOSIS — Z1231 Encounter for screening mammogram for malignant neoplasm of breast: Secondary | ICD-10-CM

## 2011-08-03 ENCOUNTER — Ambulatory Visit (HOSPITAL_COMMUNITY)
Admission: RE | Admit: 2011-08-03 | Discharge: 2011-08-03 | Disposition: A | Discharge status: Home / Self Care | Payer: 59 | Source: Ambulatory Visit | Attending: Obstetrics & Gynecology | Admitting: Obstetrics & Gynecology

## 2011-08-03 DIAGNOSIS — Z1231 Encounter for screening mammogram for malignant neoplasm of breast: Secondary | ICD-10-CM | POA: Insufficient documentation

## 2011-08-03 IMAGING — MG MM DIGITAL SCREENING BILAT W/ CAD
5 series · 5 of 5 positions shown · non-contrast
Comparison: none

DG SCREEN MAMMOGRAM BILATERAL
Bilateral CC and MLO view(s) were taken.

DIGITAL SCREENING MAMMOGRAM WITH CAD:
The breast tissue is almost entirely fatty.  No masses or malignant type calcifications are 
identified.  Compared with prior studies.
Images were processed with CAD.

[R CC]
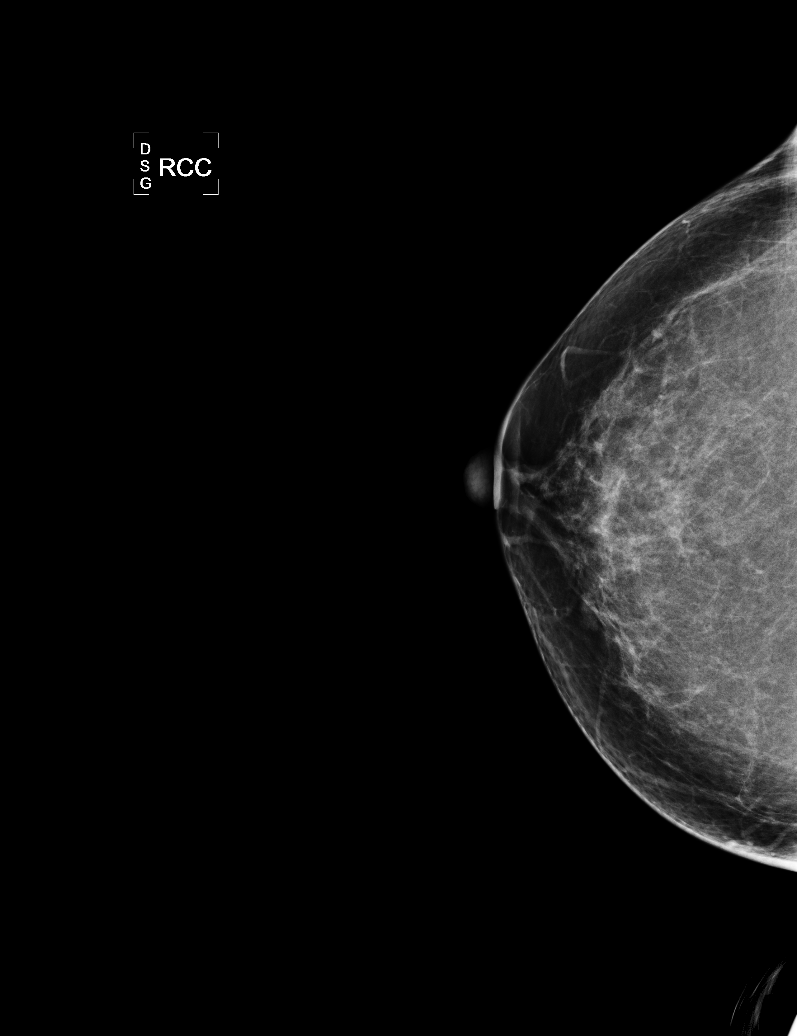

[R MLO (1 of 2)]
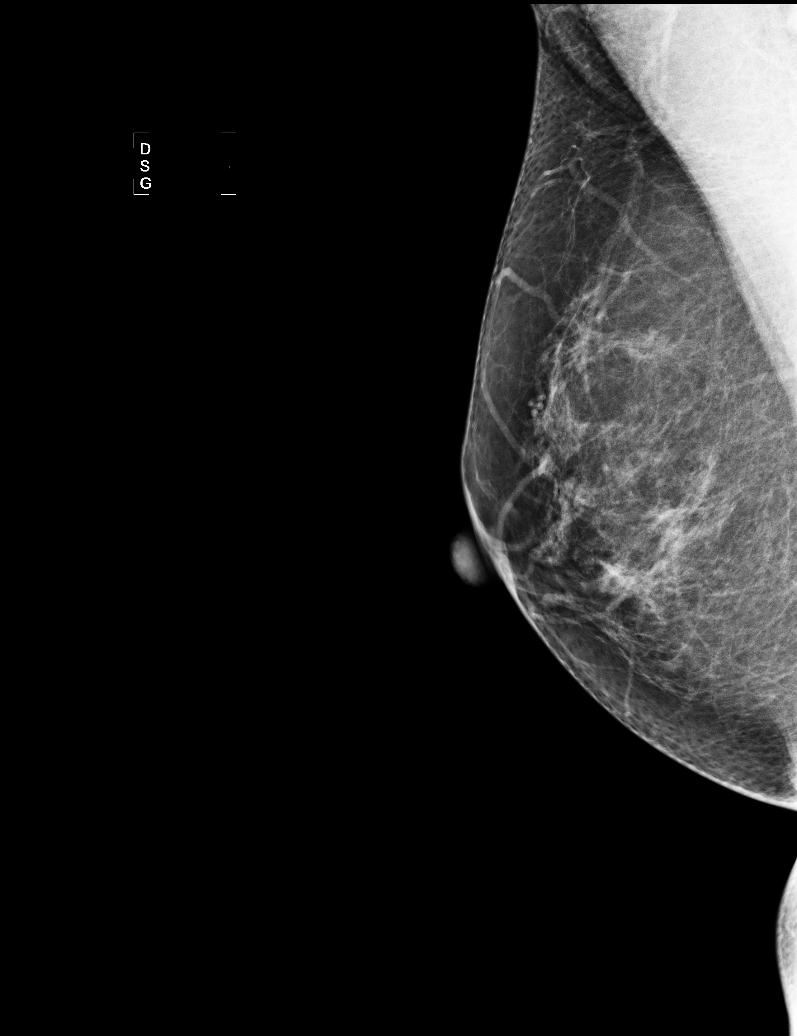

[L CC]
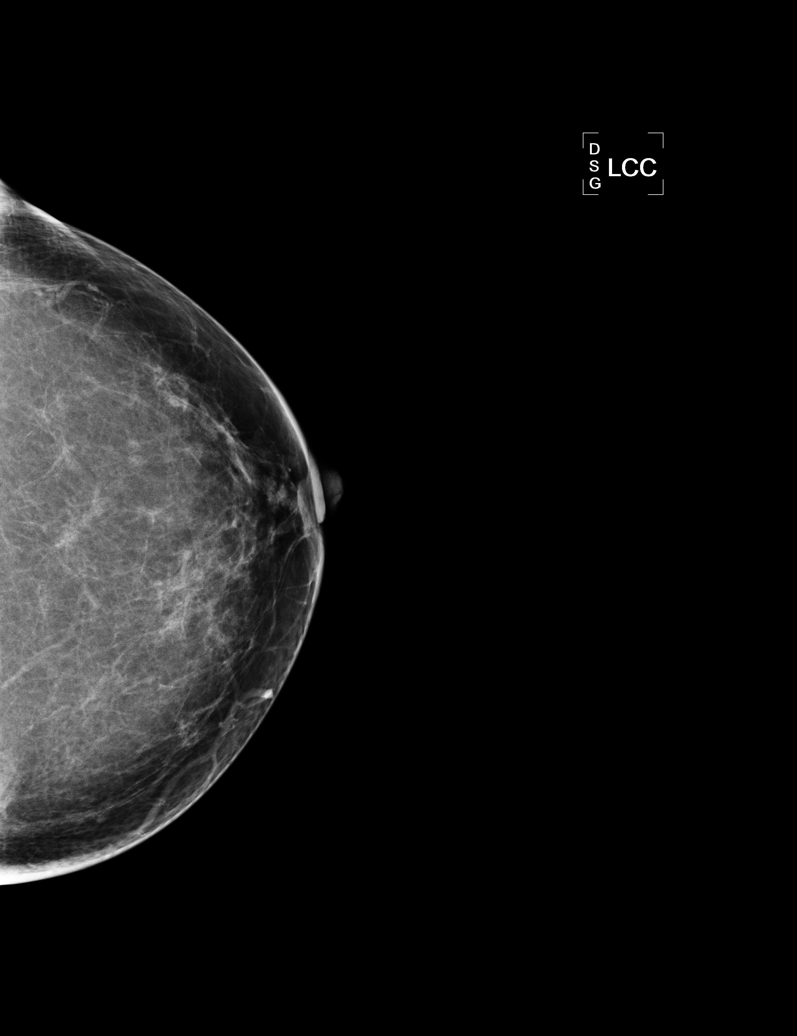

[L MLO]
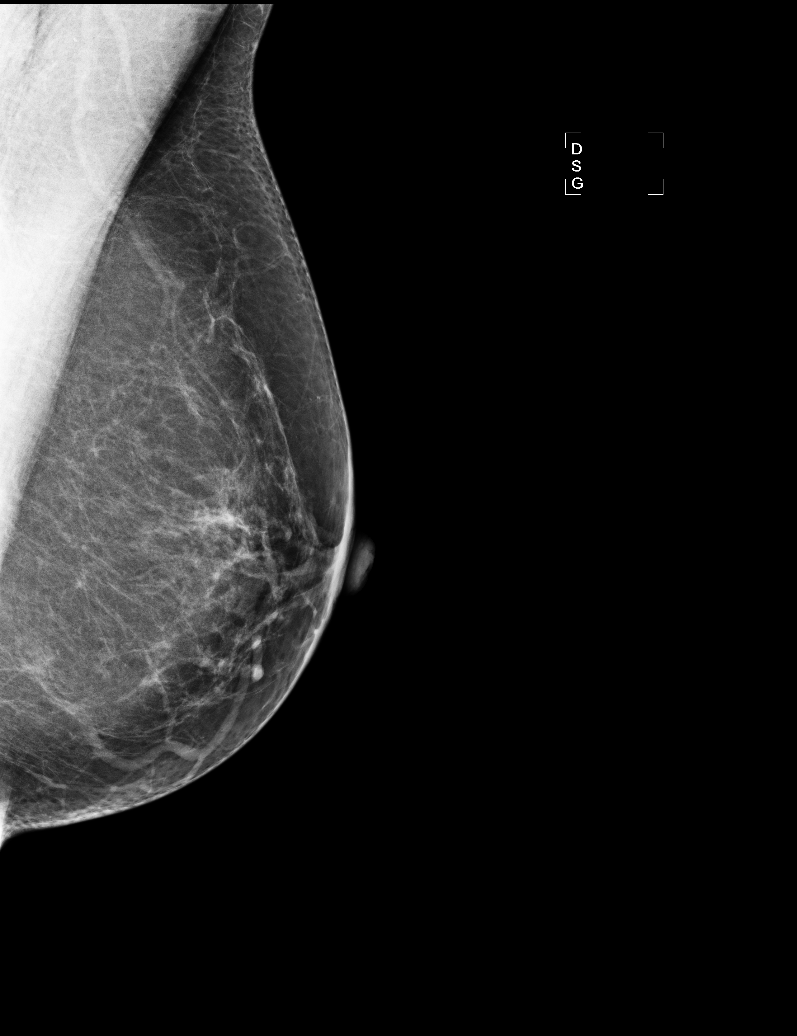

[R MLO (2 of 2)]
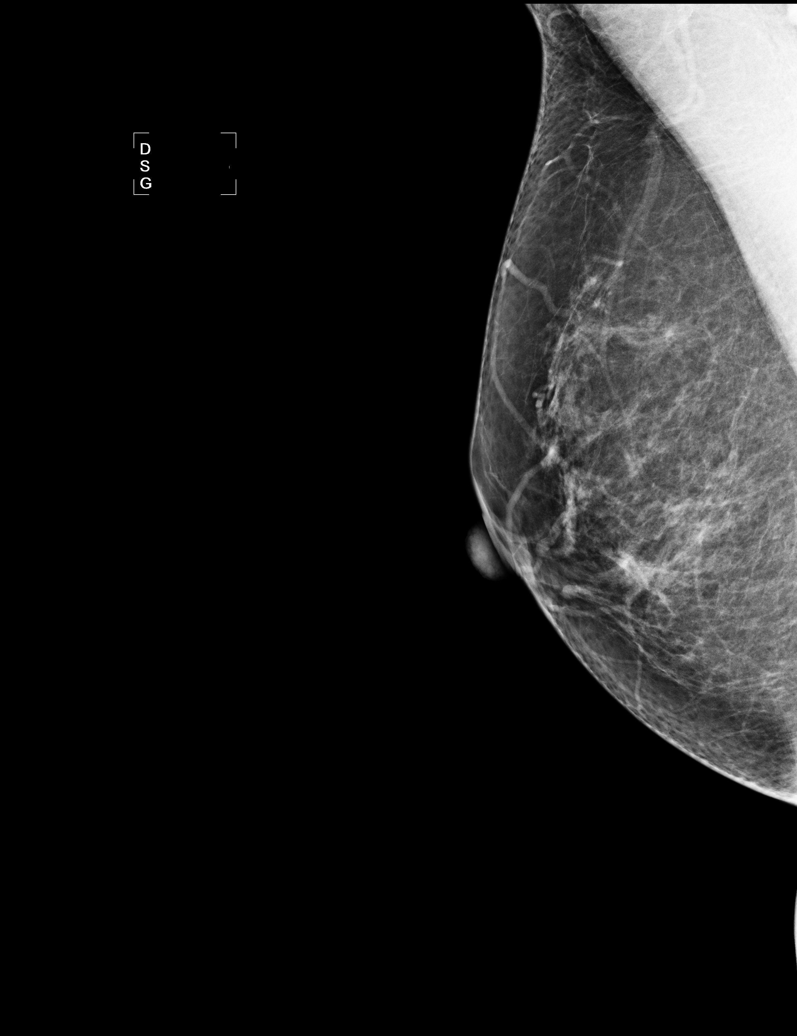

[5 of 5 positions shown; findings below may reference images not displayed]

IMPRESSION: No specific mammographic evidence of malignancy.  Next screening mammogram is recommended in one 
year.

A result letter of this screening mammogram will be mailed directly to the patient.

ASSESSMENT: Negative - BI-RADS 1

Screening mammogram in 1 year.
,

## 2011-09-21 ENCOUNTER — Ambulatory Visit: Payer: 59 | Admitting: Family Medicine

## 2012-07-06 ENCOUNTER — Encounter: Payer: Self-pay | Admitting: Family Medicine

## 2012-07-06 ENCOUNTER — Ambulatory Visit (INDEPENDENT_AMBULATORY_CARE_PROVIDER_SITE_OTHER): Payer: 59 | Admitting: Family Medicine

## 2012-07-06 VITALS — BP 120/80 | Wt 132.0 lb

## 2012-07-06 DIAGNOSIS — F3289 Other specified depressive episodes: Secondary | ICD-10-CM

## 2012-07-06 DIAGNOSIS — F988 Other specified behavioral and emotional disorders with onset usually occurring in childhood and adolescence: Secondary | ICD-10-CM

## 2012-07-06 DIAGNOSIS — F329 Major depressive disorder, single episode, unspecified: Secondary | ICD-10-CM

## 2012-07-06 DIAGNOSIS — F341 Dysthymic disorder: Secondary | ICD-10-CM | POA: Insufficient documentation

## 2012-07-06 DIAGNOSIS — F32A Depression, unspecified: Secondary | ICD-10-CM

## 2012-07-06 MED ORDER — BUPROPION HCL ER (XL) 300 MG PO TB24: 300.0000 mg | ORAL_TABLET | Freq: Every day | ORAL | Status: DC

## 2012-07-06 MED ORDER — METHYLPHENIDATE HCL 20 MG PO TABS: 20.0000 mg | ORAL_TABLET | Freq: Two times a day (BID) | ORAL | Status: DC

## 2012-07-06 NOTE — Progress Notes (Signed)
  Subjective:    Patient ID: Dorothy Wood, female    DOB: 1961/04/17, 51 y.o.   MRN: [id number]  HPI She is here for consult concerning continuing her medications. She has a history of depression and ADD. She takes 20 mg 3 times per day. They last roughly 4 hours. She has no problems with weight change or psychological ramifications when medicine wears off. She has been followed by Dr. Waverly Ferrari on an every six-month basis. She has been on steady dosing of these medications for approximately 15. She would like to switch her to help with her costs. She did have one point to try to go down to 150 mg but in the go back to the 100 mg dosing She also uses her Ritalin more or less on an as-needed basis. She does use at work and at home when she finds herself easily distracted.   Review of Systems     Objective:   Physical Exam  alert and in no distress with appropriate affect and appropriately dressed       Assessment & Plan:   1. ADD (attention deficit disorder)  methylphenidate (RITALIN) 20 MG tablet  2. Depression  buPROPion (WELLBUTRIN XL) 300 MG 24 hr tablet   I will have her sign a release form from her psychiatrist lichen have documentation. I will continue to monitor on an every six-month basis.

## 2012-08-23 ENCOUNTER — Other Ambulatory Visit (HOSPITAL_COMMUNITY): Payer: Self-pay | Admitting: Obstetrics & Gynecology

## 2012-08-23 DIAGNOSIS — Z1231 Encounter for screening mammogram for malignant neoplasm of breast: Secondary | ICD-10-CM

## 2012-09-13 ENCOUNTER — Ambulatory Visit (HOSPITAL_COMMUNITY): Payer: 59

## 2012-09-20 ENCOUNTER — Ambulatory Visit (HOSPITAL_COMMUNITY)
Admission: RE | Admit: 2012-09-20 | Discharge: 2012-09-20 | Disposition: A | Discharge status: Home / Self Care | Payer: 59 | Source: Ambulatory Visit | Attending: Obstetrics & Gynecology | Admitting: Obstetrics & Gynecology

## 2012-09-20 DIAGNOSIS — Z1231 Encounter for screening mammogram for malignant neoplasm of breast: Secondary | ICD-10-CM | POA: Insufficient documentation

## 2012-09-20 IMAGING — MG MM DIGITAL SCREENING BILAT
4 series · 4 of 4 positions shown · non-contrast
Comparison: Previous exams.

CLINICAL DATA: Screening.

DIGITAL BILATERAL SCREENING MAMMOGRAM WITH CAD

[R CC]
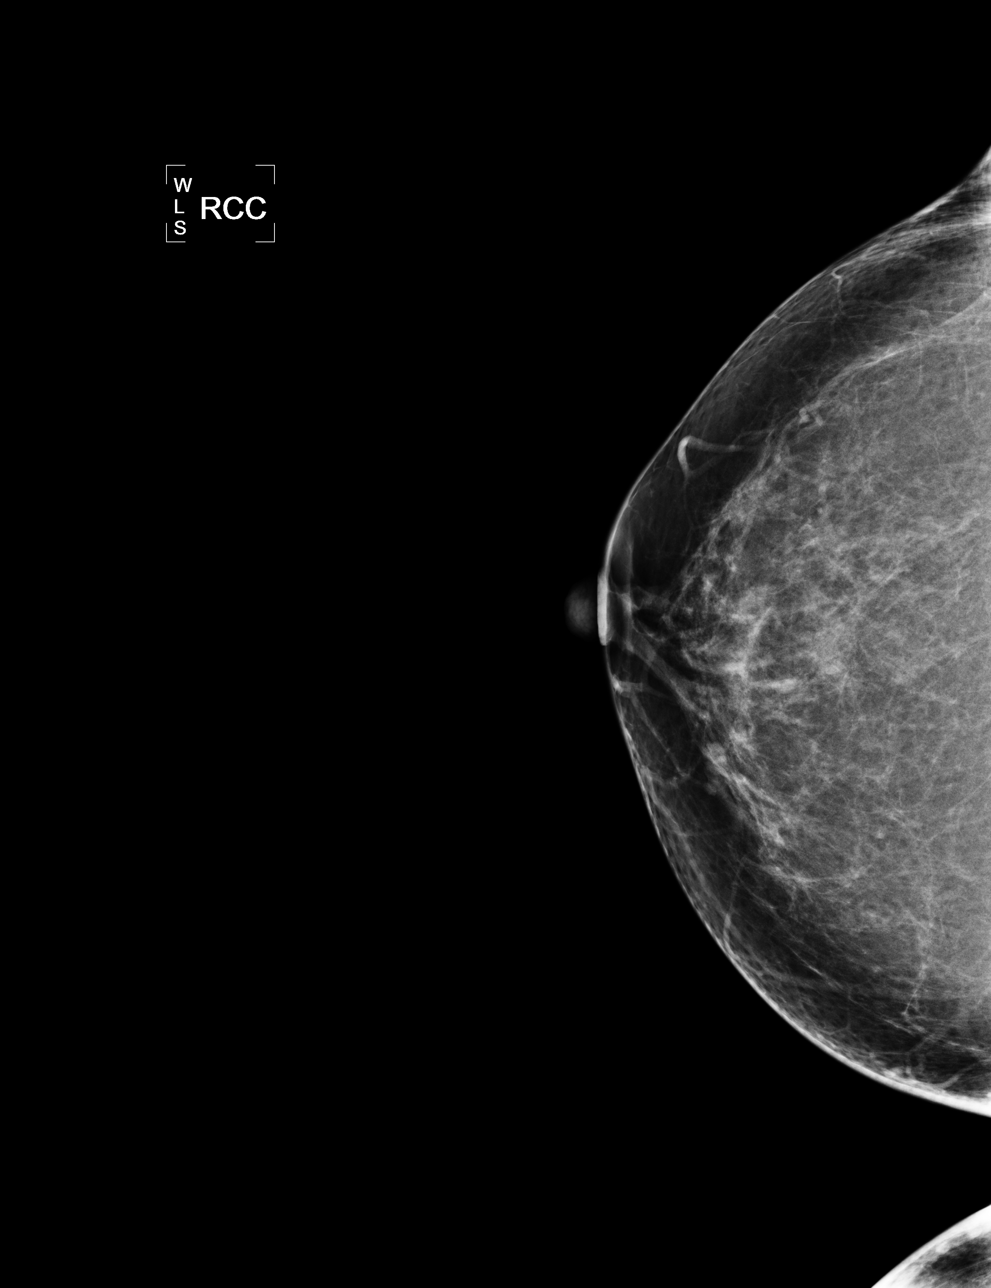

[R MLO]
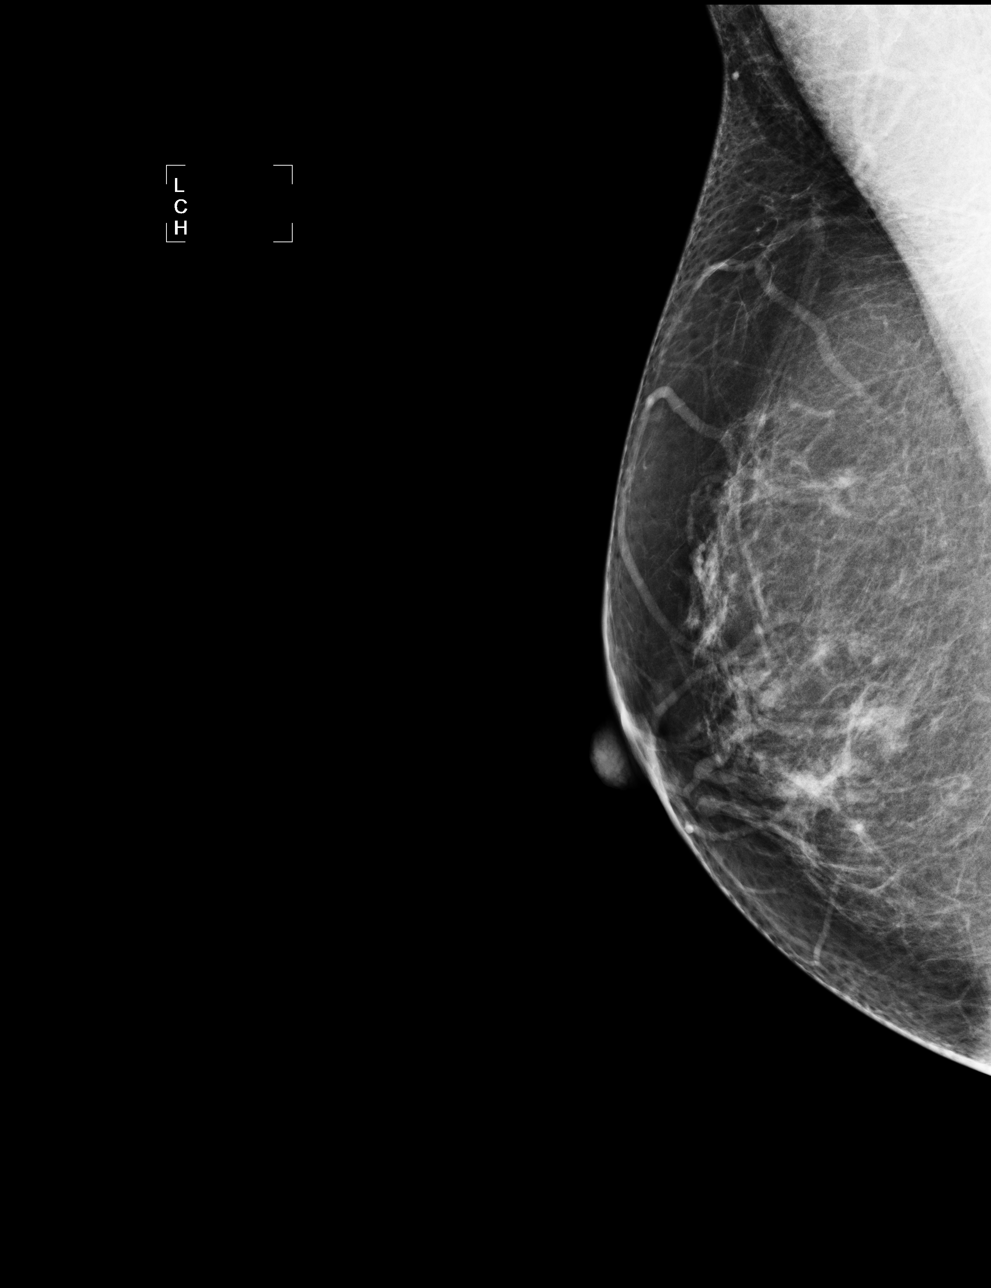

[L CC]
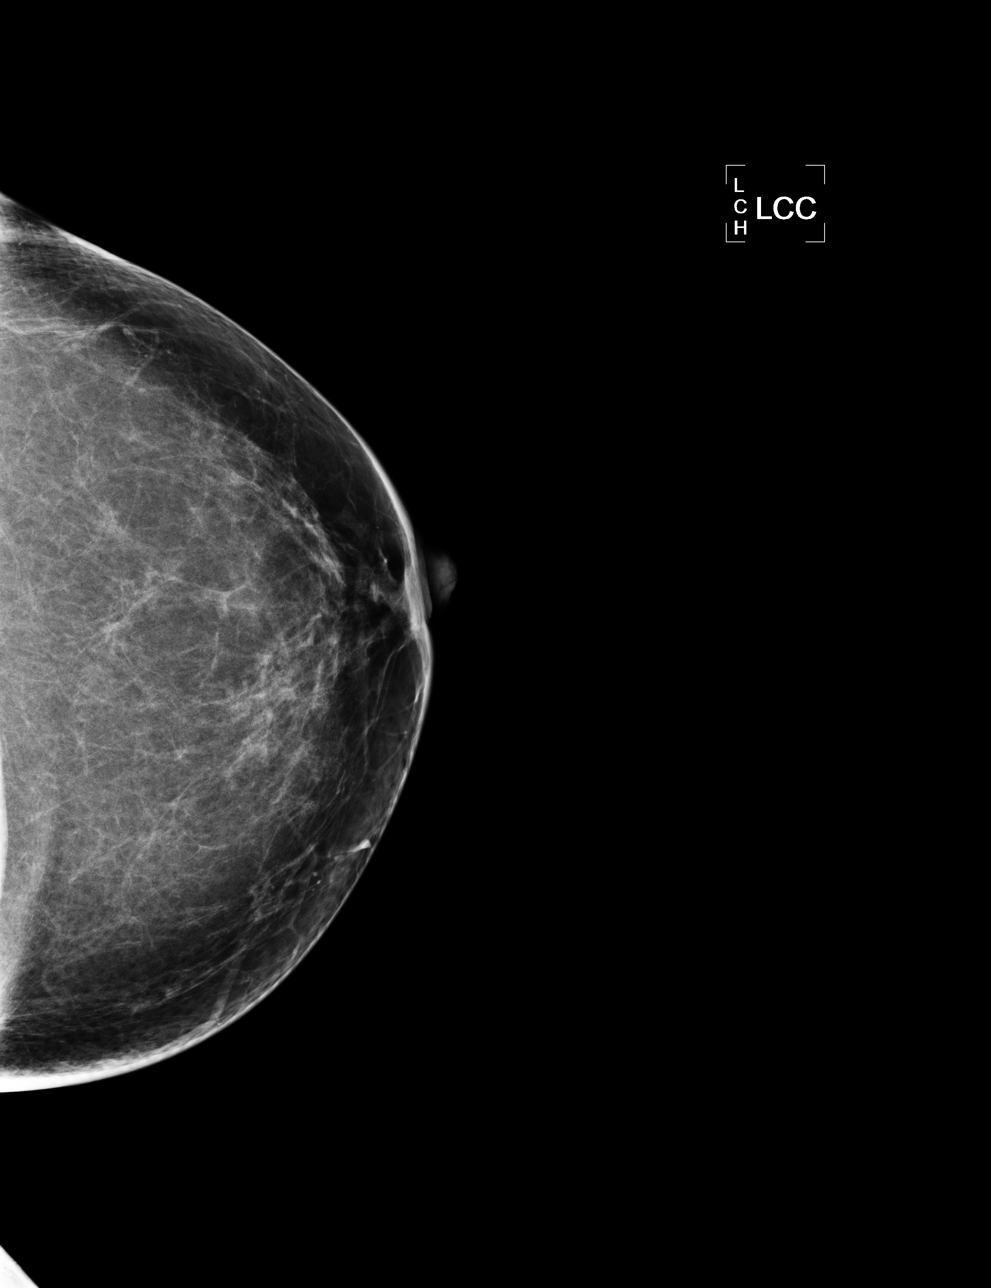

[L MLO]
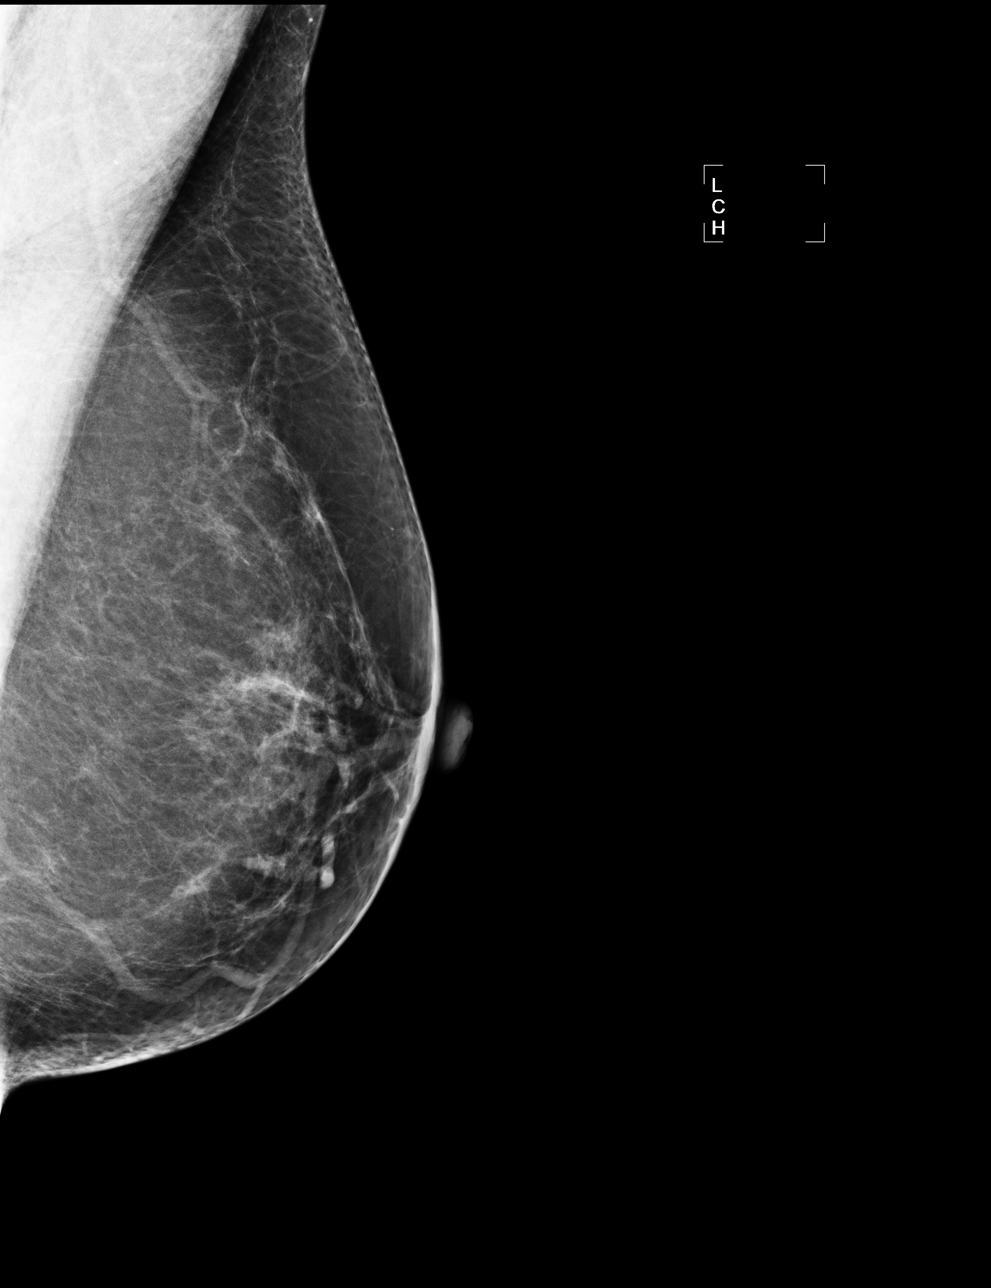

[4 of 4 positions shown; findings below may reference images not displayed]

FINDINGS: ACR Breast Density Category 2: There is a scattered fibroglandular
pattern.

No suspicious masses, architectural distortion, or calcifications
are present.

Images were processed with CAD.
IMPRESSION: No mammographic evidence of malignancy.

A result letter of this screening mammogram will be mailed directly
to the patient.

RECOMMENDATION:
Screening mammogram in one year. (Code:[FI])

BI-RADS CATEGORY 1:  Negative.

## 2012-12-01 ENCOUNTER — Telehealth: Payer: Self-pay | Admitting: Family Medicine

## 2012-12-01 DIAGNOSIS — F988 Other specified behavioral and emotional disorders with onset usually occurring in childhood and adolescence: Secondary | ICD-10-CM

## 2012-12-01 MED ORDER — METHYLPHENIDATE HCL 20 MG PO TABS: 20.0000 mg | ORAL_TABLET | Freq: Three times a day (TID) | ORAL | Status: DC

## 2012-12-01 NOTE — Telephone Encounter (Signed)
Pt needs refill on ritalin 20 mg three times a day/would like written for three months. Call when ready.

## 2012-12-01 NOTE — Telephone Encounter (Signed)
Ritalin 20 3 times a day prescription written.

## 2012-12-01 NOTE — Telephone Encounter (Signed)
rx is ready pt informed kds

## 2013-02-10 ENCOUNTER — Ambulatory Visit (INDEPENDENT_AMBULATORY_CARE_PROVIDER_SITE_OTHER): Payer: 59 | Admitting: Family Medicine

## 2013-02-10 ENCOUNTER — Encounter: Payer: Self-pay | Admitting: Family Medicine

## 2013-02-10 VITALS — BP 110/70 | HR 66

## 2013-02-10 DIAGNOSIS — M62830 Muscle spasm of back: Secondary | ICD-10-CM

## 2013-02-10 DIAGNOSIS — M5380 Other specified dorsopathies, site unspecified: Secondary | ICD-10-CM

## 2013-02-10 MED ORDER — DICLOFENAC SODIUM 75 MG PO TBEC: 75.0000 mg | DELAYED_RELEASE_TABLET | Freq: Two times a day (BID) | ORAL | Status: DC

## 2013-02-10 MED ORDER — METHOCARBAMOL 500 MG PO TABS: 500.0000 mg | ORAL_TABLET | Freq: Three times a day (TID) | ORAL | Status: DC | PRN

## 2013-02-10 NOTE — Patient Instructions (Signed)
Heat for 20 minutes 3 times per day and take the medications as needed

## 2013-02-10 NOTE — Progress Notes (Signed)
  Subjective:    Patient ID: Dorothy Wood, female    DOB: Oct 13, 1960, 52 y.o.   MRN: [id number]  HPI She was doing some gardening yesterday and developed some low back pain. She has had a previous history of this and she responded to diclofenac and Robaxin. She's had no numbness, tingling or weakness.   Review of Systems     Objective:   Physical Exam Loss of lumbar lordosis noted with some splinting when she got out of the chair. Slight tenderness palpation in the upper lumbar paravertebral muscles.       Assessment & Plan:  Back spasm - Plan: diclofenac (VOLTAREN) 75 MG EC tablet, methocarbamol (ROBAXIN) 500 MG tablet note given for out of work today.

## 2013-04-11 ENCOUNTER — Ambulatory Visit (INDEPENDENT_AMBULATORY_CARE_PROVIDER_SITE_OTHER): Payer: 59 | Admitting: Family Medicine

## 2013-04-11 ENCOUNTER — Encounter: Payer: Self-pay | Admitting: Family Medicine

## 2013-04-11 VITALS — BP 130/70 | HR 98 | Ht 65.0 in | Wt 129.0 lb

## 2013-04-11 DIAGNOSIS — M3500 Sicca syndrome, unspecified: Secondary | ICD-10-CM

## 2013-04-11 DIAGNOSIS — Z860100 Personal history of colon polyps, unspecified: Secondary | ICD-10-CM | POA: Insufficient documentation

## 2013-04-11 DIAGNOSIS — F988 Other specified behavioral and emotional disorders with onset usually occurring in childhood and adolescence: Secondary | ICD-10-CM

## 2013-04-11 DIAGNOSIS — Z8543 Personal history of malignant neoplasm of ovary: Secondary | ICD-10-CM | POA: Insufficient documentation

## 2013-04-11 DIAGNOSIS — R000 Tachycardia, unspecified: Secondary | ICD-10-CM

## 2013-04-11 HISTORY — DX: Personal history of colon polyps, unspecified: Z86.0100

## 2013-04-11 NOTE — Progress Notes (Signed)
  Subjective:    Patient ID: Dorothy Wood, female    DOB: February 12, 1961, 51 y.o.   MRN: [id number]  HPI Oxalate 2 weeks ago she noted a vibrating sensation in the right lower quadrant; the symptoms lasted about a week. She had one episode of rapid heart rate with chest pressure and shortness of breath. She measured her heart rate which was 160. She then started to check her blood pressure and the pressures ranged from 130-140/.60-70. She had to 2 days of calf pain with exercise last week however this has not continued. Review of her past medical history was done and she has a previous history of ovarian cancer with hysterectomy. She is on thyroid medication given to her by her gynecologist. She also has Sjogren's syndrome and is on medication given to her by her dentist. She continues on her ADD medication and is having no difficulty with that. She also has a history of colonic polyps and was getting colonoscopy every 3 years but most recently has been backed off to every 5 years.   Review of Systems     Objective:   Physical Exam alert and in no distress. Tympanic membranes and canals are normal. Throat is clear. Tonsils are normal. Neck is supple without adenopathy or thyromegaly. Cardiac exam shows a regular sinus rhythm without murmurs or gallops. Lungs are clear to auscultation. Abdominal exam shows no lower abdominal masses with normal distal pulses..  EKG shows incomplete right bundle     Assessment & Plan:  Sjogren's syndrome - Plan: Comprehensive metabolic panel, CBC with Differential  History of ovarian cancer - Plan: Comprehensive metabolic panel, CBC with Differential  Personal history of colonic polyps  Tachycardia - Plan: EKG 12-Lead, Lipid panel, Comprehensive metabolic panel, CBC with Differential  ADD (attention deficit disorder)  I will do routine blood work on her. Since EKG is normal no further evaluation unless she has more episodes.

## 2013-04-11 NOTE — Patient Instructions (Signed)
If you have any more episodes of tachycardia, let me know

## 2013-04-12 LAB — LIPID PANEL
Cholesterol: 199 mg/dL (ref 0–200)
HDL: 56 mg/dL (ref >39)
LDL Cholesterol: 119 mg/dL — ABNORMAL HIGH (ref 0–99)
Total CHOL/HDL Ratio: 3.6 Ratio
Triglycerides: 121 mg/dL (ref <150)
VLDL: 24 mg/dL (ref 0–40)

## 2013-04-12 LAB — CBC WITH DIFFERENTIAL/PLATELET
Basophils Absolute: 0 10*3/uL (ref 0.0–0.1)
Eosinophils Absolute: 0.1 10*3/uL (ref 0.0–0.7)
HCT: 38.4 % (ref 36.0–46.0)
Lymphocytes Relative: 30 % (ref 12–46)
MCV: 88.3 fL (ref 78.0–100.0)
Neutrophils Relative %: 58 % (ref 43–77)
Platelets: 236 10*3/uL (ref 150–400)
RDW: 13.6 % (ref 11.5–15.5)

## 2013-04-12 LAB — COMPREHENSIVE METABOLIC PANEL
AST: 20 U/L (ref 0–37)
Albumin: 4.2 g/dL (ref 3.5–5.2)
Glucose, Bld: 93 mg/dL (ref 70–99)
Potassium: 4.2 mEq/L (ref 3.5–5.3)
Total Protein: 6.4 g/dL (ref 6.0–8.3)

## 2013-04-12 NOTE — Progress Notes (Signed)
Quick Note:  The blood work is normal ______ 

## 2013-04-12 NOTE — Progress Notes (Signed)
Quick Note:  MAILED LETTER OF RESULTS ______

## 2013-04-14 ENCOUNTER — Other Ambulatory Visit: Payer: Self-pay | Admitting: Family Medicine

## 2013-04-14 DIAGNOSIS — F988 Other specified behavioral and emotional disorders with onset usually occurring in childhood and adolescence: Secondary | ICD-10-CM

## 2013-04-17 MED ORDER — METHYLPHENIDATE HCL 20 MG PO TABS: 20.0000 mg | ORAL_TABLET | Freq: Three times a day (TID) | ORAL | Status: DC

## 2013-05-04 ENCOUNTER — Encounter: Payer: Self-pay | Admitting: Internal Medicine

## 2013-05-16 ENCOUNTER — Encounter: Payer: 59 | Admitting: Family Medicine

## 2013-07-20 ENCOUNTER — Other Ambulatory Visit: Payer: Self-pay

## 2013-09-06 ENCOUNTER — Ambulatory Visit: Payer: 59 | Admitting: Family Medicine

## 2013-09-29 ENCOUNTER — Other Ambulatory Visit: Payer: Self-pay | Admitting: Family Medicine

## 2013-09-29 ENCOUNTER — Encounter: Payer: Self-pay | Admitting: Family Medicine

## 2013-09-29 DIAGNOSIS — F988 Other specified behavioral and emotional disorders with onset usually occurring in childhood and adolescence: Secondary | ICD-10-CM

## 2013-09-29 NOTE — Telephone Encounter (Signed)
Last OV 04-11-13 no future sch appt. Last filled 07-06-12. Is this okay to fill or do pt need appt?

## 2013-10-02 MED ORDER — BUPROPION HCL ER (XL) 300 MG PO TB24: ORAL_TABLET | ORAL | Status: DC

## 2013-10-02 MED ORDER — METHYLPHENIDATE HCL 20 MG PO TABS
20.0000 mg | ORAL_TABLET | Freq: Three times a day (TID) | ORAL | Status: DC
Start: 2013-10-02 — End: 2014-04-11

## 2013-11-15 ENCOUNTER — Other Ambulatory Visit (HOSPITAL_COMMUNITY): Payer: Self-pay | Admitting: Obstetrics & Gynecology

## 2013-11-15 DIAGNOSIS — Z1231 Encounter for screening mammogram for malignant neoplasm of breast: Secondary | ICD-10-CM

## 2013-11-29 ENCOUNTER — Ambulatory Visit (HOSPITAL_COMMUNITY): Payer: 59

## 2013-11-29 ENCOUNTER — Ambulatory Visit (HOSPITAL_COMMUNITY)
Admission: RE | Admit: 2013-11-29 | Discharge: 2013-11-29 | Disposition: A | Discharge status: Home / Self Care | Payer: 59 | Source: Ambulatory Visit | Attending: Obstetrics & Gynecology | Admitting: Obstetrics & Gynecology

## 2013-11-29 DIAGNOSIS — Z1231 Encounter for screening mammogram for malignant neoplasm of breast: Secondary | ICD-10-CM | POA: Insufficient documentation

## 2013-11-29 IMAGING — MG MM SCREENING BREAST TOMO BILATERAL
8 series · 9 of 24 positions shown · non-contrast
Comparison: Previous exam(s).

CLINICAL DATA: Screening.

EXAM:
DIGITAL SCREENING BILATERAL MAMMOGRAM WITH 3D TOMO WITH CAD

[R CC]
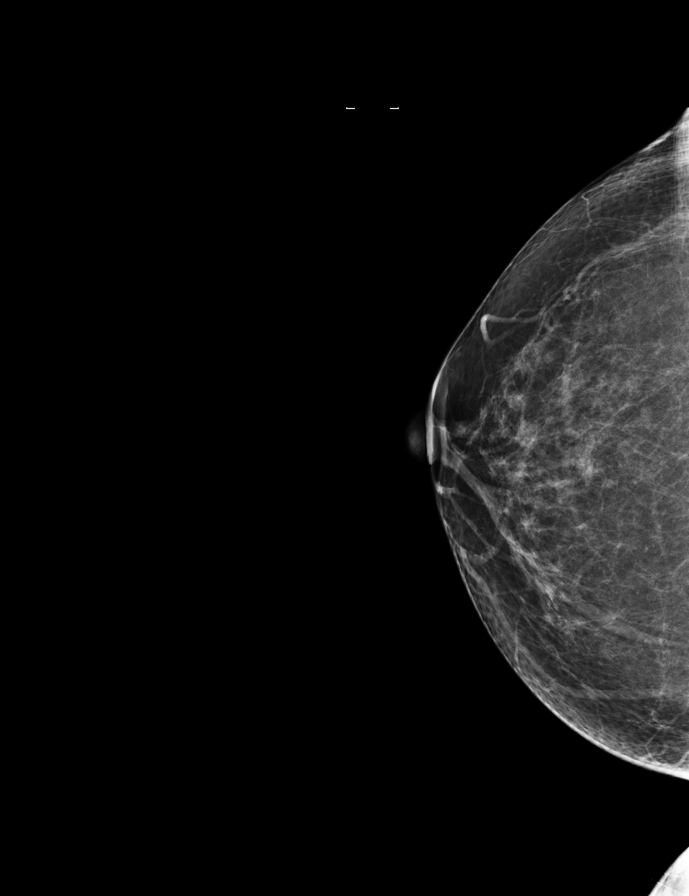

[L CC]
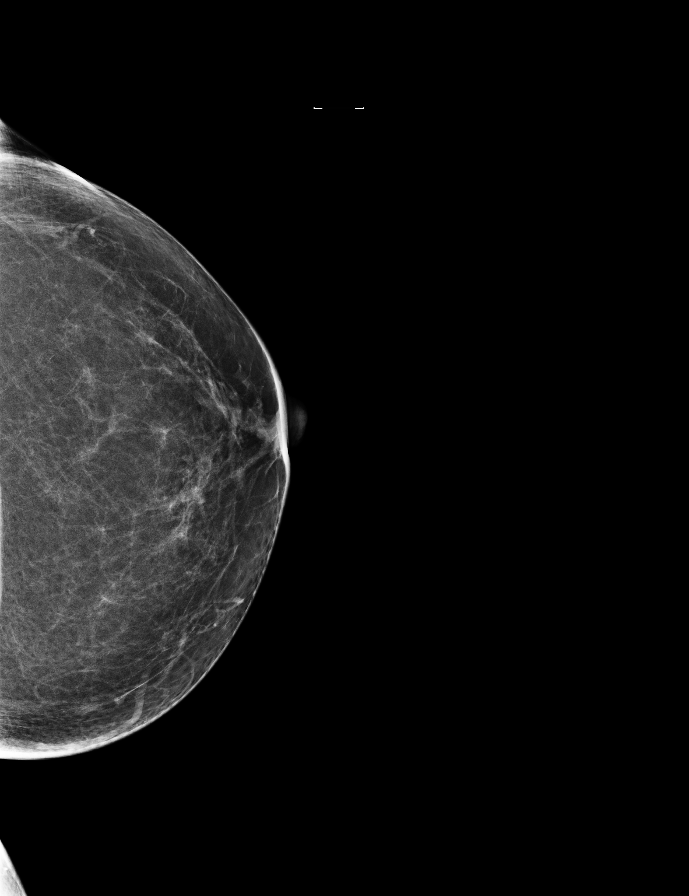

[L MLO]
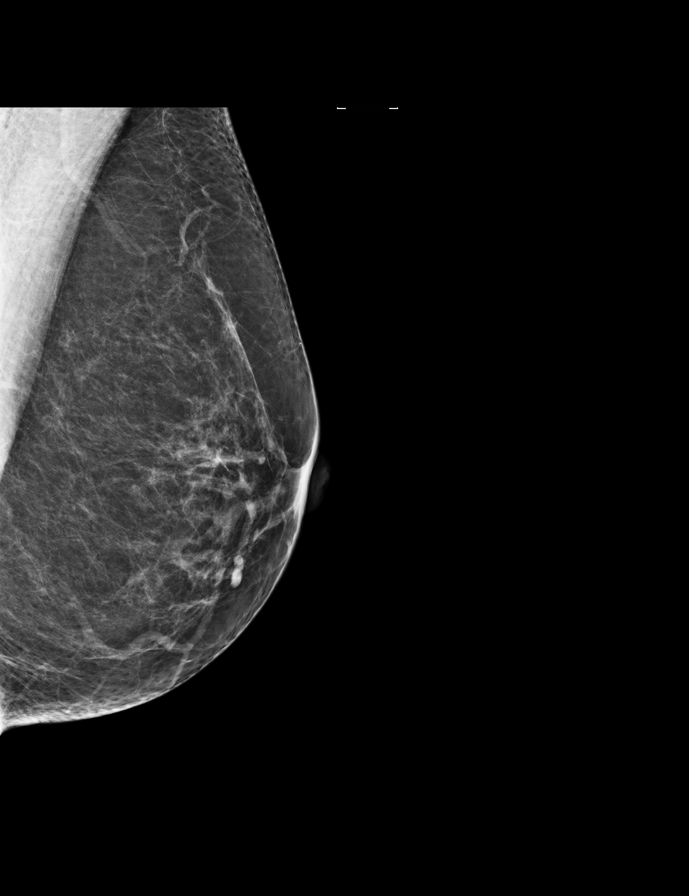

[R MLO]
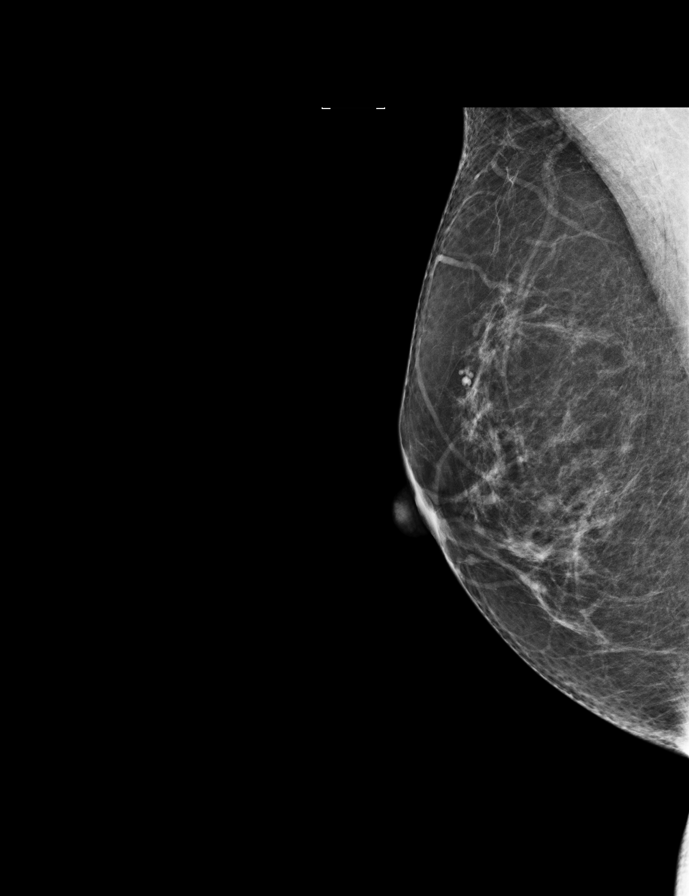

[R MLO tomo · 2 of 79 frames shown]
[frame 26/79]
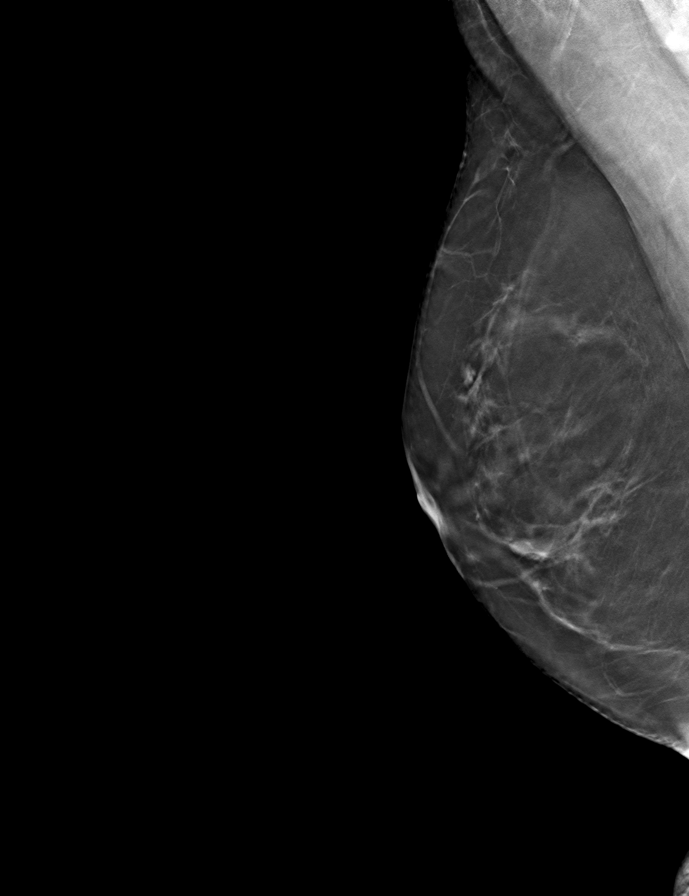
[frame 40/79]
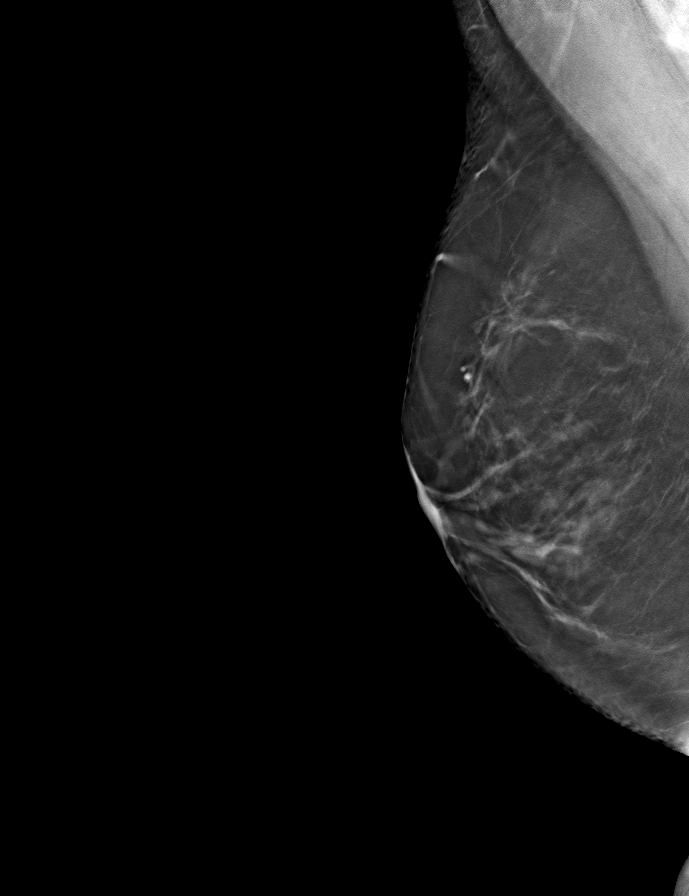

[R CC tomo · tomo slice 41/81.0]
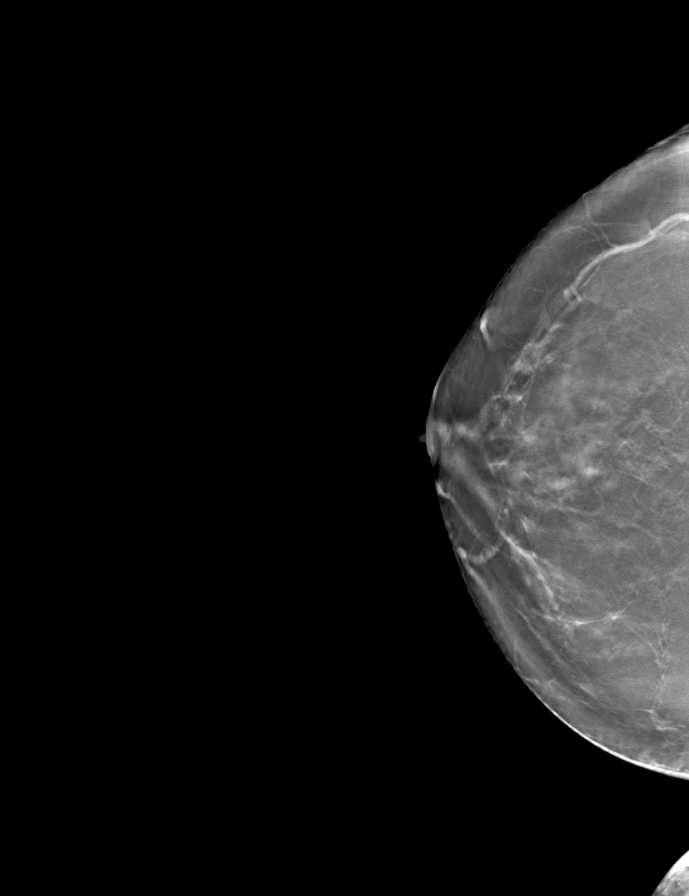

[L MLO tomo · tomo slice 41/80.0]
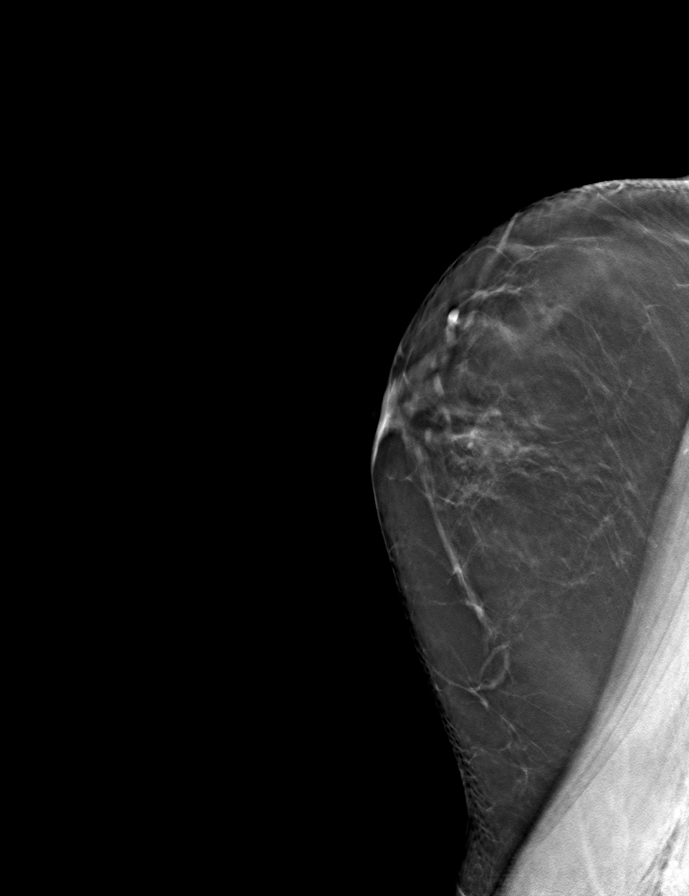

[L CC tomo · tomo slice 43/84.0]
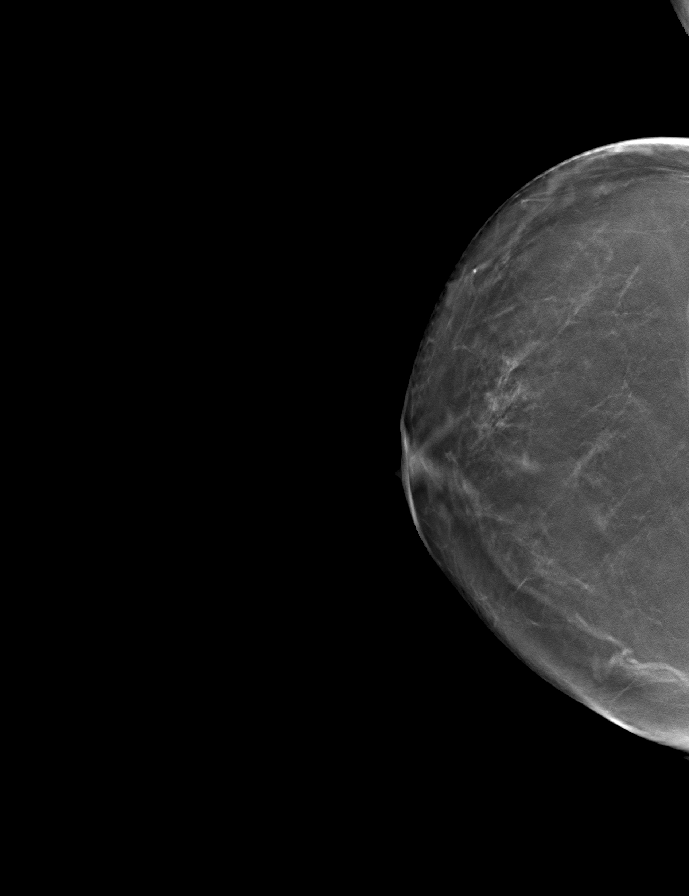

[9 of 24 positions shown; findings below may reference images not displayed]

ACR Breast Density Category b: There are scattered areas of
fibroglandular density.
FINDINGS: There are no findings suspicious for malignancy. Images were
processed with CAD.
IMPRESSION: No mammographic evidence of malignancy. A result letter of this
screening mammogram will be mailed directly to the patient.

RECOMMENDATION:
Screening mammogram in one year. (Code:[F0])

BI-RADS CATEGORY  1: Negative.

## 2014-04-10 ENCOUNTER — Encounter: Payer: Self-pay | Admitting: Family Medicine

## 2014-04-11 ENCOUNTER — Other Ambulatory Visit: Payer: Self-pay | Admitting: Family Medicine

## 2014-04-11 DIAGNOSIS — F988 Other specified behavioral and emotional disorders with onset usually occurring in childhood and adolescence: Secondary | ICD-10-CM

## 2014-04-11 MED ORDER — METHYLPHENIDATE HCL 20 MG PO TABS: 20.0000 mg | ORAL_TABLET | Freq: Three times a day (TID) | ORAL | Status: DC

## 2014-05-09 ENCOUNTER — Ambulatory Visit (INDEPENDENT_AMBULATORY_CARE_PROVIDER_SITE_OTHER): Payer: 59 | Admitting: Family Medicine

## 2014-05-09 ENCOUNTER — Encounter: Payer: Self-pay | Admitting: Family Medicine

## 2014-05-09 VITALS — BP 124/78 | HR 68 | Wt 134.0 lb

## 2014-05-09 DIAGNOSIS — Z23 Encounter for immunization: Secondary | ICD-10-CM

## 2014-05-09 DIAGNOSIS — F988 Other specified behavioral and emotional disorders with onset usually occurring in childhood and adolescence: Secondary | ICD-10-CM

## 2014-05-09 DIAGNOSIS — M3500 Sicca syndrome, unspecified: Secondary | ICD-10-CM

## 2014-05-09 MED ORDER — METHYLPHENIDATE HCL 20 MG PO TABS: 20.0000 mg | ORAL_TABLET | Freq: Three times a day (TID) | ORAL | Status: DC

## 2014-05-09 MED ORDER — LORAZEPAM 0.5 MG PO TABS: 0.5000 mg | ORAL_TABLET | Freq: Two times a day (BID) | ORAL | Status: DC | PRN

## 2014-05-09 NOTE — Progress Notes (Signed)
   Subjective:    Patient ID: Dorothy Wood, female    DOB: Feb 28, 1961, 53 y.o.   MRN: [id number]  HPI She is here for medication recheck. She does have underlying ADD. She gets roughly 4 hours worth of benefit for medication and when it wears off tends to have loss of focus but no other symptoms. She uses it usually 3 times per day but does. Depending upon her schedule. She would also like a tetanus booster. She is getting ready to go to [place] on a mission trip. She would like medication to help with the flight. She does have some issues with anxiety from this. She also has underlying Sjogren's syndrome and is given a medication by her dentist. She is seen by her gynecologist who is following her thyroid. She continues to do well on Wellbutrin. She doesn't truly have depression but the Wellbutrin does help with anxiety and mood issues.   Review of Systems     Objective:   Physical Exam Alert and in no distress with appropriate affect and dressed appropriately.       Assessment & Plan:  Need for prophylactic vaccination and inoculation against influenza - Plan: Flu Vaccine QUAD 36+ mos IM  Need for Tdap vaccination - Plan: Tdap vaccine greater than or equal to 7yo IM  ADD (attention deficit disorder) - Plan: methylphenidate (RITALIN) 20 MG tablet  Sjogren's syndrome  discuss coming here for her followup exam since she has had a hysterectomy and her medical problems can be handled right here. She will consider this. Small prescription for Ativan was written to help with your flying. Also recommend using this to help with sleep issues since she is probably going to be going through several different time zones.

## 2014-07-12 ENCOUNTER — Encounter: Payer: Self-pay | Admitting: Family Medicine

## 2014-07-13 ENCOUNTER — Encounter: Payer: Self-pay | Admitting: Medical

## 2014-07-13 ENCOUNTER — Other Ambulatory Visit: Payer: Self-pay | Admitting: Medical

## 2014-07-13 ENCOUNTER — Other Ambulatory Visit: Payer: Self-pay | Admitting: Family Medicine

## 2014-07-13 ENCOUNTER — Ambulatory Visit (INDEPENDENT_AMBULATORY_CARE_PROVIDER_SITE_OTHER): Payer: 59 | Admitting: Medical

## 2014-07-13 VITALS — BP 102/70 | HR 100 | Temp 100.0°F | Resp 14 | Wt 131.0 lb

## 2014-07-13 DIAGNOSIS — R509 Fever, unspecified: Secondary | ICD-10-CM

## 2014-07-13 DIAGNOSIS — R51 Headache: Secondary | ICD-10-CM

## 2014-07-13 DIAGNOSIS — Z789 Other specified health status: Secondary | ICD-10-CM

## 2014-07-13 DIAGNOSIS — R519 Headache, unspecified: Secondary | ICD-10-CM

## 2014-07-13 DIAGNOSIS — M791 Myalgia, unspecified site: Secondary | ICD-10-CM

## 2014-07-13 DIAGNOSIS — R312 Other microscopic hematuria: Secondary | ICD-10-CM

## 2014-07-13 DIAGNOSIS — R52 Pain, unspecified: Secondary | ICD-10-CM

## 2014-07-13 DIAGNOSIS — R3129 Other microscopic hematuria: Secondary | ICD-10-CM

## 2014-07-13 LAB — POCT URINALYSIS DIPSTICK
BILIRUBIN UA: NEGATIVE
GLUCOSE UA: NEGATIVE
KETONES UA: NEGATIVE
Leukocytes, UA: NEGATIVE
Nitrite, UA: NEGATIVE
SPEC GRAV UA: 1.015
Urobilinogen, UA: NEGATIVE
pH, UA: 5

## 2014-07-13 LAB — POC INFLUENZA A&B (BINAX/QUICKVUE)
INFLUENZA A, POC: NEGATIVE
INFLUENZA B, POC: NEGATIVE
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
## 2014-07-14 ENCOUNTER — Other Ambulatory Visit: Payer: Self-pay | Admitting: Medical

## 2014-07-14 LAB — BASIC METABOLIC PANEL
BUN: 10 mg/dL (ref 6–23)
CHLORIDE: 101 meq/L (ref 96–112)
CO2: 22 mEq/L (ref 19–32)
Calcium: 8.8 mg/dL (ref 8.4–10.5)
Creat: 0.84 mg/dL (ref 0.50–1.10)
GLUCOSE: 79 mg/dL (ref 70–99)
Potassium: 3.9 mEq/L (ref 3.5–5.3)
Sodium: 135 mEq/L (ref 135–145)

## 2014-07-14 LAB — CBC WITH DIFFERENTIAL/PLATELET
BASOS PCT: 0 % (ref 0–1)
Basophils Absolute: 0 10*3/uL (ref 0.0–0.1)
Eosinophils Absolute: 0 10*3/uL (ref 0.0–0.7)
Eosinophils Relative: 0 % (ref 0–5)
HEMATOCRIT: 36.7 % (ref 36.0–46.0)
Hemoglobin: 12.6 g/dL (ref 12.0–15.0)
LYMPHS PCT: 11 % — AB (ref 12–46)
Lymphs Abs: 0.4 10*3/uL — ABNORMAL LOW (ref 0.7–4.0)
MCH: 29.9 pg (ref 26.0–34.0)
MCHC: 34.3 g/dL (ref 30.0–36.0)
MCV: 87 fL (ref 78.0–100.0)
MONO ABS: 0.3 10*3/uL (ref 0.1–1.0)
MONOS PCT: 8 % (ref 3–12)
NEUTROS ABS: 2.8 10*3/uL (ref 1.7–7.7)
Neutrophils Relative %: 81 % — ABNORMAL HIGH (ref 43–77)
Platelets: 171 10*3/uL (ref 150–400)
RBC: 4.22 MIL/uL (ref 3.87–5.11)
RDW: 13.6 % (ref 11.5–15.5)
WBC: 3.4 10*3/uL — ABNORMAL LOW (ref 4.0–10.5)

## 2014-07-14 LAB — HEPATITIS A ANTIBODY, IGM: HEP A IGM: NONREACTIVE

## 2014-07-14 LAB — HEPATIC FUNCTION PANEL
ALBUMIN: 4.2 g/dL (ref 3.5–5.2)
ALT: 60 U/L — ABNORMAL HIGH (ref 0–35)
AST: 45 U/L — AB (ref 0–37)
Alkaline Phosphatase: 58 U/L (ref 39–117)
Bilirubin, Direct: <0.1 mg/dL (ref 0.0–0.3)
Total Bilirubin: 0.2 mg/dL (ref 0.2–1.2)
Total Protein: 6.4 g/dL (ref 6.0–8.3)

## 2014-07-14 MED ORDER — ATOVAQUONE-PROGUANIL HCL 250-100 MG PO TABS: 4.0000 | ORAL_TABLET | Freq: Every day | ORAL | Status: DC

## 2014-07-15 ENCOUNTER — Encounter: Payer: Self-pay | Admitting: Medical

## 2014-07-16 ENCOUNTER — Encounter (HOSPITAL_COMMUNITY): Payer: Self-pay

## 2014-07-16 ENCOUNTER — Ambulatory Visit (INDEPENDENT_AMBULATORY_CARE_PROVIDER_SITE_OTHER): Payer: 59 | Admitting: Medical

## 2014-07-16 ENCOUNTER — Telehealth: Payer: Self-pay | Admitting: Medical

## 2014-07-16 ENCOUNTER — Inpatient Hospital Stay (HOSPITAL_COMMUNITY)
Admission: AD | Admit: 2014-07-16 | Discharge: 2014-07-19 | DRG: 864 | Disposition: A | Discharge status: Home / Self Care | Payer: 59 | Source: Ambulatory Visit | Attending: Internal Medicine | Admitting: Internal Medicine

## 2014-07-16 ENCOUNTER — Encounter: Payer: Self-pay | Admitting: Medical

## 2014-07-16 ENCOUNTER — Telehealth: Payer: Self-pay | Admitting: Family Medicine

## 2014-07-16 VITALS — BP 102/78 | HR 72 | Temp 98.7°F

## 2014-07-16 DIAGNOSIS — R509 Fever, unspecified: Secondary | ICD-10-CM | POA: Diagnosis present

## 2014-07-16 DIAGNOSIS — D61818 Other pancytopenia: Secondary | ICD-10-CM | POA: Diagnosis present

## 2014-07-16 DIAGNOSIS — M2550 Pain in unspecified joint: Secondary | ICD-10-CM | POA: Diagnosis present

## 2014-07-16 DIAGNOSIS — Z8543 Personal history of malignant neoplasm of ovary: Secondary | ICD-10-CM

## 2014-07-16 DIAGNOSIS — Z79899 Other long term (current) drug therapy: Secondary | ICD-10-CM

## 2014-07-16 DIAGNOSIS — M549 Dorsalgia, unspecified: Secondary | ICD-10-CM

## 2014-07-16 DIAGNOSIS — R519 Headache, unspecified: Secondary | ICD-10-CM

## 2014-07-16 DIAGNOSIS — Z789 Other specified health status: Secondary | ICD-10-CM

## 2014-07-16 DIAGNOSIS — M791 Myalgia: Secondary | ICD-10-CM

## 2014-07-16 DIAGNOSIS — E039 Hypothyroidism, unspecified: Secondary | ICD-10-CM | POA: Diagnosis present

## 2014-07-16 DIAGNOSIS — IMO0001 Reserved for inherently not codable concepts without codable children: Secondary | ICD-10-CM

## 2014-07-16 DIAGNOSIS — M3500 Sicca syndrome, unspecified: Secondary | ICD-10-CM | POA: Diagnosis present

## 2014-07-16 DIAGNOSIS — F988 Other specified behavioral and emotional disorders with onset usually occurring in childhood and adolescence: Secondary | ICD-10-CM | POA: Diagnosis present

## 2014-07-16 DIAGNOSIS — M609 Myositis, unspecified: Secondary | ICD-10-CM

## 2014-07-16 DIAGNOSIS — R112 Nausea with vomiting, unspecified: Secondary | ICD-10-CM

## 2014-07-16 DIAGNOSIS — F329 Major depressive disorder, single episode, unspecified: Secondary | ICD-10-CM | POA: Diagnosis present

## 2014-07-16 DIAGNOSIS — F900 Attention-deficit hyperactivity disorder, predominantly inattentive type: Secondary | ICD-10-CM

## 2014-07-16 DIAGNOSIS — F341 Dysthymic disorder: Secondary | ICD-10-CM | POA: Diagnosis present

## 2014-07-16 DIAGNOSIS — R21 Rash and other nonspecific skin eruption: Secondary | ICD-10-CM | POA: Diagnosis present

## 2014-07-16 DIAGNOSIS — D72819 Decreased white blood cell count, unspecified: Secondary | ICD-10-CM | POA: Diagnosis present

## 2014-07-16 DIAGNOSIS — R51 Headache: Secondary | ICD-10-CM

## 2014-07-16 DIAGNOSIS — Z2089 Contact with and (suspected) exposure to other communicable diseases: Secondary | ICD-10-CM

## 2014-07-16 LAB — COMPREHENSIVE METABOLIC PANEL
ALT: 52 U/L — ABNORMAL HIGH (ref 0–35)
ANION GAP: 11 (ref 5–15)
AST: 60 U/L — ABNORMAL HIGH (ref 0–37)
Albumin: 3.3 g/dL — ABNORMAL LOW (ref 3.5–5.2)
Alkaline Phosphatase: 64 U/L (ref 39–117)
BUN: 13 mg/dL (ref 6–23)
CHLORIDE: 98 meq/L (ref 96–112)
CO2: 24 meq/L (ref 19–32)
CREATININE: 0.85 mg/dL (ref 0.50–1.10)
Calcium: 8.2 mg/dL — ABNORMAL LOW (ref 8.4–10.5)
GFR, EST AFRICAN AMERICAN: 89 mL/min — AB (ref >90)
GFR, EST NON AFRICAN AMERICAN: 77 mL/min — AB (ref >90)
Glucose, Bld: 117 mg/dL — ABNORMAL HIGH (ref 70–99)
Potassium: 4 mEq/L (ref 3.7–5.3)
Sodium: 133 mEq/L — ABNORMAL LOW (ref 137–147)
Total Protein: 6 g/dL (ref 6.0–8.3)

## 2014-07-16 LAB — CBC WITH DIFFERENTIAL/PLATELET
Basophils Absolute: 0 10*3/uL (ref 0.0–0.1)
Basophils Relative: 0 % (ref 0–1)
EOS PCT: 3 % (ref 0–5)
Eosinophils Absolute: 0 10*3/uL (ref 0.0–0.7)
HCT: 38.8 % (ref 36.0–46.0)
Hemoglobin: 12.7 g/dL (ref 12.0–15.0)
LYMPHS ABS: 0.5 10*3/uL — AB (ref 0.7–4.0)
Lymphocytes Relative: 30 % (ref 12–46)
MCH: 29.3 pg (ref 26.0–34.0)
MCHC: 32.7 g/dL (ref 30.0–36.0)
MCV: 89.4 fL (ref 78.0–100.0)
Monocytes Absolute: 0.1 10*3/uL (ref 0.1–1.0)
Monocytes Relative: 9 % (ref 3–12)
Neutro Abs: 1 10*3/uL — ABNORMAL LOW (ref 1.7–7.7)
Neutrophils Relative %: 58 % (ref 43–77)
PLATELETS: 86 10*3/uL — AB (ref 150–400)
RBC: 4.34 MIL/uL (ref 3.87–5.11)
RDW: 12.8 % (ref 11.5–15.5)
WBC: 1.6 10*3/uL — ABNORMAL LOW (ref 4.0–10.5)

## 2014-07-16 LAB — URINALYSIS, ROUTINE W REFLEX MICROSCOPIC
GLUCOSE, UA: NEGATIVE mg/dL
Hgb urine dipstick: NEGATIVE
KETONES UR: NEGATIVE mg/dL
Leukocytes, UA: NEGATIVE
NITRITE: NEGATIVE
PROTEIN: 30 mg/dL — AB
Specific Gravity, Urine: 1.029 (ref 1.005–1.030)
UROBILINOGEN UA: 0.2 mg/dL (ref 0.0–1.0)
pH: 5.5 (ref 5.0–8.0)

## 2014-07-16 LAB — MALARIA SMEAR
RESULT - MALAR: NONE SEEN
Result - MALAR: NEGATIVE

## 2014-07-16 LAB — URINE MICROSCOPIC-ADD ON

## 2014-07-16 MED ORDER — OXYCODONE HCL 5 MG PO TABS
5.0000 mg | ORAL_TABLET | ORAL | Status: DC | PRN
  Administered 2014-07-16 – 2014-07-19 (×12): 5 mg via ORAL
  Filled 2014-07-16 (×12): qty 1

## 2014-07-16 MED ORDER — DOCUSATE SODIUM 100 MG PO CAPS
100.0000 mg | ORAL_CAPSULE | Freq: Two times a day (BID) | ORAL | Status: DC
  Administered 2014-07-16 – 2014-07-19 (×6): 100 mg via ORAL
  Filled 2014-07-16 (×7): qty 1

## 2014-07-16 MED ORDER — MORPHINE SULFATE 2 MG/ML IJ SOLN: 1.0000 mg | INTRAMUSCULAR | Status: DC | PRN

## 2014-07-16 MED ORDER — BUPROPION HCL ER (XL) 300 MG PO TB24
300.0000 mg | ORAL_TABLET | Freq: Every day | ORAL | Status: DC
  Administered 2014-07-17 – 2014-07-19 (×3): 300 mg via ORAL
  Filled 2014-07-16 (×3): qty 1

## 2014-07-16 MED ORDER — SODIUM CHLORIDE 0.9 % IV SOLN
INTRAVENOUS | Status: DC
  Administered 2014-07-16 – 2014-07-19 (×5): via INTRAVENOUS

## 2014-07-16 MED ORDER — ACETAMINOPHEN 650 MG RE SUPP: 650.0000 mg | Freq: Four times a day (QID) | RECTAL | Status: DC | PRN

## 2014-07-16 MED ORDER — ONDANSETRON HCL 4 MG/2ML IJ SOLN
4.0000 mg | Freq: Four times a day (QID) | INTRAMUSCULAR | Status: DC | PRN
  Administered 2014-07-16 – 2014-07-18 (×5): 4 mg via INTRAVENOUS
  Filled 2014-07-16 (×5): qty 2

## 2014-07-16 MED ORDER — ACETAMINOPHEN 325 MG PO TABS
650.0000 mg | ORAL_TABLET | Freq: Four times a day (QID) | ORAL | Status: DC | PRN
  Administered 2014-07-16 – 2014-07-18 (×5): 650 mg via ORAL
  Filled 2014-07-16 (×5): qty 2

## 2014-07-16 MED ORDER — LEVOTHYROXINE SODIUM 50 MCG PO TABS
50.0000 ug | ORAL_TABLET | Freq: Every day | ORAL | Status: DC
  Administered 2014-07-17 – 2014-07-19 (×3): 50 ug via ORAL
  Filled 2014-07-16 (×4): qty 1

## 2014-07-16 MED ORDER — PILOCARPINE HCL 5 MG PO TABS
5.0000 mg | ORAL_TABLET | Freq: Four times a day (QID) | ORAL | Status: DC
  Administered 2014-07-16 – 2014-07-19 (×8): 5 mg via ORAL
  Filled 2014-07-16 (×14): qty 1

## 2014-07-16 NOTE — Telephone Encounter (Signed)
Pt called and states she is no better.  What now.  She would like to talk with you.  Pt ph [phone]
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
## 2014-07-16 NOTE — Telephone Encounter (Signed)
Patient has had a fever of 102 to 104, nausea, dehydration, body aches and she is no better. I spoke with Dorothea Ogle [facility] and Dr. Redmond School to see what they wanted the patient to do next and they recommend that she come to the office for a visit so that we could recheck her vitals and then determined what we would do with her next. Patient was informed of this information and they are on the way.

## 2014-07-16 NOTE — Telephone Encounter (Signed)
pls check with Dorothy Wood ASAP.     1) I need malaria results, urine culture results and blood culture results NOW! 2) please call and get update on symptoms first thing? 3) for future reference, check with Dorothy Wood to see why we didn't get STAT results back STAT.  The malaria results should have been done q12 hours, and I only have the first part of the first specimen done in computer.  But all the other stuff, CBC, BMET, wasn't resulted til later that night.    That is unacceptable!!!

## 2014-07-16 NOTE — Consult Note (Addendum)
[facility]     Reason for Consult: fever in returned traveler    Referring Physician: Dr. Lyman Speller  Principal Problem:   Fever, unknown origin Active Problems:   ADD (attention deficit disorder)   Depression      Recommendations: Supportive care with NSAIDS, Tylenol in between is ok from my standpoint unless LFTS significantly up  I will check dengue and chikungunya antibodies, though can be negative early in illness (first 5 days or so).  They won't be back though prior to resolution.   I will resend malaria smear for completness I would anticipate potential for another 2-3 days of high fever before resolution.   UA due to urinary discomfort, though this may be due to Sjogren's  Assessment: She has high fever, joint aches predominately, leukopenia c/w chikungunya/dengue or other viral illness.    Antibiotics: Took antimalarials  HPI: Dorothy Wood is a 53 y.o. female with Sjogren's, hypothyroidism who was recently in [place] on a mission trip about 8 days, took doxycyline for malaria prophylaxis (though ran out several days prior to return), used DEET who about 5 days ago had rather abrupt onset fever up to 104 and aching joints.  Started with small joints, migrated to large joints, back.  Clearly described as joint and not muscle aches.  Some migrating joint swelling in small joints.  Very uncomfortable.  No rashes, denies headache but daughter says she did have a headache, not retroorbital.  No history of dengue in past.  No other people sick.  Did get mosquito bites daytime and night.  Does not recall any tick bites.  Some n/v, dry heaving and unable to keep much down.   Some urinary discomfort.    Review of Systems: A comprehensive review of systems was negative.  Past Medical History  Diagnosis Date  . Thyroid disease     HYPOTHYROIDISM  . Sjogren's disease   . Colonic polyp   . Cancer     OVARIAN    History  Substance Use  Topics  . Smoking status: Never Smoker   . Smokeless tobacco: Never Used  . Alcohol Use: Not on file    Family History  Problem Relation Age of Onset  . Mental illness Mother   . Mental illness Sister   . Mental illness Brother   . Mental illness Maternal Uncle   . Mental illness Maternal Grandmother   . Cancer Paternal Grandmother    No Known Allergies  OBJECTIVE: Blood pressure 109/49, pulse 94, temperature 100.1 F (37.8 C), temperature source Oral, height 5\' 9"  (1.753 m). General: awake, alert,mild distress with arthralgias Skin: some blanching erythema on upper trunk Lungs: CTA B Cor: RRR  Abdomen: soft, nt, nd, +bs Ext: no edema LAD: no lad in cervical, supraclavicular  Microbiology: Recent Results (from the past 240 hour(s))  Culture, blood (single)     Status: None (Preliminary result)   Collection Time: 07/13/14 10:58 AM  Result Value Ref Range Status   Preliminary Report Blood Culture received; No Growth to date;  Preliminary   Preliminary Report Culture will be held for 5 days before issuing  Preliminary   Preliminary Report a Final Negative report.  Preliminary  Culture, blood (single)     Status: None (Preliminary result)   Collection Time: 07/13/14 11:40 AM  Result Value Ref Range Status   Preliminary Report Blood Culture received; No Growth to date;  Preliminary   Preliminary Report Culture will be held for  5 days before issuing  Preliminary   Preliminary Report a Final Negative report.  Preliminary  Malaria smear     Status: None   Collection Time: 07/13/14 12:07 PM  Result Value Ref Range Status   Result - MALAR No Plasmodium or Other Blood Parasites Seen  Final   Result - MALAR on Thick or Thin Smears  Final   Result - MALAR For persons strongly suspected of having a  Final   Result - MALAR   Final    blood parasite,but have negative smears, it is recommended   Result - MALAR   Final    that blood films be repeated approximately every 12 to 24    Result - MALAR hours for 3 consecutive days.  Final    Kenetra Hildenbrand, Herbie [place], [facility] [place] [email or web address] [phone] pager  [phone] cell 07/16/2014, 2:39 PM

## 2014-07-16 NOTE — Telephone Encounter (Signed)
pls call back, get all symptoms as of now?  Here is what I know so far, the blood cultures take a few days to result and so far no growth, her urine apparently did not get run at the lab, and the malaria cultures are still pending.    Did she go for all 3 malaria slides?  Does she have any urinary c/o, burning, pressure, frequency?  What other symptoms?    Let me know, and then I will be happy to call her.   If urine symptoms too, please have her return for urine repeat to send for culture.

## 2014-07-16 NOTE — Telephone Encounter (Signed)
Patient did take 4 dose's of the Malaria medication she has 2 more take. She states today as far as urinary symptoms she is having some burning. Patient is coming in for OV 07/16/14 to be seen.

## 2014-07-17 DIAGNOSIS — R21 Rash and other nonspecific skin eruption: Secondary | ICD-10-CM

## 2014-07-17 DIAGNOSIS — D696 Thrombocytopenia, unspecified: Secondary | ICD-10-CM

## 2014-07-17 DIAGNOSIS — D72819 Decreased white blood cell count, unspecified: Secondary | ICD-10-CM | POA: Diagnosis present

## 2014-07-17 DIAGNOSIS — A90 Dengue fever [classical dengue]: Secondary | ICD-10-CM

## 2014-07-17 LAB — COMPREHENSIVE METABOLIC PANEL
ALBUMIN: 3 g/dL — AB (ref 3.5–5.2)
ALT: 57 U/L — ABNORMAL HIGH (ref 0–35)
ANION GAP: 10 (ref 5–15)
AST: 69 U/L — ABNORMAL HIGH (ref 0–37)
Alkaline Phosphatase: 60 U/L (ref 39–117)
BUN: 10 mg/dL (ref 6–23)
CALCIUM: 7.8 mg/dL — AB (ref 8.4–10.5)
CHLORIDE: 102 meq/L (ref 96–112)
CO2: 27 mEq/L (ref 19–32)
Creatinine, Ser: 0.92 mg/dL (ref 0.50–1.10)
GFR calc Af Amer: 81 mL/min — ABNORMAL LOW (ref >90)
GFR, EST NON AFRICAN AMERICAN: 70 mL/min — AB (ref >90)
Glucose, Bld: 94 mg/dL (ref 70–99)
Potassium: 4.1 mEq/L (ref 3.7–5.3)
Sodium: 139 mEq/L (ref 137–147)
Total Protein: 5.6 g/dL — ABNORMAL LOW (ref 6.0–8.3)

## 2014-07-17 LAB — CBC
HEMATOCRIT: 39.9 % (ref 36.0–46.0)
Hemoglobin: 12.9 g/dL (ref 12.0–15.0)
MCH: 29.5 pg (ref 26.0–34.0)
MCHC: 32.3 g/dL (ref 30.0–36.0)
MCV: 91.1 fL (ref 78.0–100.0)
Platelets: 78 10*3/uL — ABNORMAL LOW (ref 150–400)
RBC: 4.38 MIL/uL (ref 3.87–5.11)
RDW: 13.1 % (ref 11.5–15.5)
WBC: 2.2 10*3/uL — AB (ref 4.0–10.5)

## 2014-07-17 LAB — PATHOLOGIST SMEAR REVIEW

## 2014-07-17 LAB — HIV ANTIBODY (ROUTINE TESTING W REFLEX): HIV: NONREACTIVE

## 2014-07-17 NOTE — Progress Notes (Signed)
PROGRESS NOTE  Dorothy Wood WLN:[id number] DOB: September 09, 1961 DOA: 07/16/2014 PCP: Wyatt Haste, MD  HPI/Recap of past 16 hours: 53 year old female with past medical history of hypothyroidism returned back after trip from [place] and has had continued fevers of 102-104+ pain throughout in her joints. Admitted to hospitalist service as a direct admission and found to have leukopenia. Infectious disease consult added and suspicions strongly for dengue fever. Initial malaria smear negative.  Today, patient continues to improve with less joint pain. White blood cell count slowly improving.has developed nonspecific viral exanthem.still fatigued.  Assessment/Plan: Principal Problem:   Fever, unknown origin: As above. Suspected dengue fever. Possible discharge tomorrow with outpatient monitoring of white blood cell count and platelets. Active Problems:   ADD (attention deficit disorder)   Depression: Continue SSRI   Joint pain: Slowly improving, less joints involved. Indication infection is passing.   Hypothyroidism:on Synthroid   Leukopenia: Slowly improving, as expected with dengue fever   Code Status: Full code  Family Communication: Sister at bedside  Disposition Plan: Possible DC tomorrow if feeling better (less fevers, joint pain)   Consultants:  ID-signed off  Procedures:  None  Antibiotics:  None   Objective: BP 100/59 mmHg  Pulse 71  Temp(Src) 98.3 F (36.8 C) (Oral)  Resp 16  Ht 5\' 9"  (1.753 m)  Wt 59.4 kg (130 lb 15.3 oz)  BMI 19.33 kg/m2  SpO2 99%  Intake/Output Summary (Last 24 hours) at 07/17/14 1556 Last data filed at 07/16/14 1812  Gross per 24 hour  Intake  302.5 ml  Output      0 ml  Net  302.5 ml   Filed Weights   07/16/14 1300  Weight: 59.4 kg (130 lb 15.3 oz)    Exam:   General:  Alert and oriented 3, no acute distress, fatigued  Cardiovascular: regular rate and rhythm, S1-S2  Respiratory: clear to to auscultation  bilaterally  Abdomen: soft, nontender, nondistended, positive bowel sounds  Musculoskeletal: no clubbing or cyanosis or edema   Data Reviewed: Basic Metabolic Panel:  Recent Labs Lab 07/13/14 1058 07/16/14 1549 07/17/14 0517  NA 135 133* 139  K 3.9 4.0 4.1  CL 101 98 102  CO2 22 24 27   GLUCOSE 79 117* 94  BUN 10 13 10   CREATININE 0.84 0.85 0.92  CALCIUM 8.8 8.2* 7.8*   Liver Function Tests:  Recent Labs Lab 07/13/14 1058 07/16/14 1549 07/17/14 0517  AST 45* 60* 69*  ALT 60* 52* 57*  ALKPHOS 58 64 60  BILITOT 0.2 <0.2* <0.2*  PROT 6.4 6.0 5.6*  ALBUMIN 4.2 3.3* 3.0*   No results for input(s): LIPASE, AMYLASE in the last 168 hours. No results for input(s): AMMONIA in the last 168 hours. CBC:  Recent Labs Lab 07/13/14 1058 07/16/14 1549 07/17/14 0517  WBC 3.4* 1.6* 2.2*  NEUTROABS 2.8 1.0*  --   HGB 12.6 12.7 12.9  HCT 36.7 38.8 39.9  MCV 87.0 89.4 91.1  PLT 171 86* 78*   Cardiac Enzymes:   No results for input(s): CKTOTAL, CKMB, CKMBINDEX, TROPONINI in the last 168 hours. BNP (last 3 results) No results for input(s): PROBNP in the last 8760 hours. CBG: No results for input(s): GLUCAP in the last 168 hours.  Recent Results (from the past 240 hour(s))  Culture, blood (single)     Status: None (Preliminary result)   Collection Time: 07/13/14 10:58 AM  Result Value Ref Range Status   Preliminary Report Blood Culture received; No Growth to date;  Preliminary   Preliminary Report Culture will be held for 5 days before issuing  Preliminary   Preliminary Report a Final Negative report.  Preliminary  Culture, blood (single)     Status: None (Preliminary result)   Collection Time: 07/13/14 11:40 AM  Result Value Ref Range Status   Preliminary Report Blood Culture received; No Growth to date;  Preliminary   Preliminary Report Culture will be held for 5 days before issuing  Preliminary   Preliminary Report a Final Negative report.  Preliminary  Malaria smear      Status: None   Collection Time: 07/13/14 12:07 PM  Result Value Ref Range Status   Result - MALAR No Plasmodium or Other Blood Parasites Seen  Final   Result - MALAR on Thick or Thin Smears  Final   Result - MALAR For persons strongly suspected of having a  Final   Result - MALAR   Final    blood parasite,but have negative smears, it is recommended   Result - MALAR   Final    that blood films be repeated approximately every 12 to 24   Result - MALAR hours for 3 consecutive days.  Final     Studies: No results found.  Scheduled Meds: . buPROPion  300 mg Oral Daily  . docusate sodium  100 mg Oral BID  . levothyroxine  50 mcg Oral QAC breakfast  . pilocarpine  5 mg Oral QID    Continuous Infusions: . sodium chloride 75 mL/hr at 07/16/14 1534     Time spent: 15 minutes  [facility] Pager [phone]. If 7PM-7AM, please contact night-coverage at [email or web address], password [facility] 07/17/2014, 3:56 PM  LOS: 1 day

## 2014-07-17 NOTE — Progress Notes (Signed)
Came to visit patient at bedside on behalf of [employer] program for [employer] employees with [employer]. Explained Link to [employer]. Denies having any needs. Appreciative of visit. Brochure left at bedside. Marthenia Rolling, MSN- [facility] [phone]

## 2014-07-17 NOTE — Progress Notes (Addendum)
    [facility] for Infectious Disease  Date of Admission:  07/16/2014  Antibiotics: none  Subjective: Feels better  Objective: Temp:  [98.6 F (37 C)-102 F (38.9 C)] 98.6 F (37 C) (11/03 0952) Pulse Rate:  [81-87] 87 (11/03 0700) Resp:  [14-15] 14 (11/03 0700) BP: (107-123)/(50-59) 123/59 mmHg (11/03 0700) SpO2:  [98 %] 98 % (11/03 0700)  General: awake, alert, nad Skin: diffuse confluent macular blanching rash on torso Lungs: CTA B Cor: RRR Abdomen: soft, nt, nd   Lab Results Lab Results  Component Value Date   WBC 2.2* 07/17/2014   HGB 12.9 07/17/2014   HCT 39.9 07/17/2014   MCV 91.1 07/17/2014   PLT 78* 07/17/2014    Lab Results  Component Value Date   CREATININE 0.92 07/17/2014   BUN 10 07/17/2014   NA 139 07/17/2014   K 4.1 07/17/2014   CL 102 07/17/2014   CO2 27 07/17/2014    Lab Results  Component Value Date   ALT 57* 07/17/2014   AST 69* 07/17/2014   ALKPHOS 60 07/17/2014   BILITOT <0.2* 07/17/2014      Microbiology: Recent Results (from the past 240 hour(s))  Culture, blood (single)     Status: None (Preliminary result)   Collection Time: 07/13/14 10:58 AM  Result Value Ref Range Status   Preliminary Report Blood Culture received; No Growth to date;  Preliminary   Preliminary Report Culture will be held for 5 days before issuing  Preliminary   Preliminary Report a Final Negative report.  Preliminary  Culture, blood (single)     Status: None (Preliminary result)   Collection Time: 07/13/14 11:40 AM  Result Value Ref Range Status   Preliminary Report Blood Culture received; No Growth to date;  Preliminary   Preliminary Report Culture will be held for 5 days before issuing  Preliminary   Preliminary Report a Final Negative report.  Preliminary  Malaria smear     Status: None   Collection Time: 07/13/14 12:07 PM  Result Value Ref Range Status   Result - MALAR No Plasmodium or Other Blood Parasites Seen  Final   Result - MALAR on Thick  or Thin Smears  Final   Result - MALAR For persons strongly suspected of having a  Final   Result - MALAR   Final    blood parasite,but have negative smears, it is recommended   Result - MALAR   Final    that blood films be repeated approximately every 12 to 24   Result - MALAR hours for 3 consecutive days.  Final    Studies/Results: No results found.  Assessment/Plan: 1) dengue - with rash, I suspect more dengue as appears more typical, toward the end or when fever subsides.  Also with leukopenia, thrombocytopenia. She is improving.  Could be non specific viral rash.  Antibodies sent to lab but will be at least a week before resulted.  I have removed droplet isolation, flu was negative at her PCP office.  -will need outpatient monitoring of her WBC and platelets.    I will sign off, call with questions  Scharlene Gloss, [facility] [place] [email or web address] [phone] pager   [phone] cell 07/17/2014, 2:09 PM

## 2014-07-18 DIAGNOSIS — D72819 Decreased white blood cell count, unspecified: Secondary | ICD-10-CM

## 2014-07-18 DIAGNOSIS — R509 Fever, unspecified: Principal | ICD-10-CM

## 2014-07-18 DIAGNOSIS — D61818 Other pancytopenia: Secondary | ICD-10-CM | POA: Diagnosis present

## 2014-07-18 DIAGNOSIS — M2550 Pain in unspecified joint: Secondary | ICD-10-CM

## 2014-07-18 LAB — CBC
HCT: 35 % — ABNORMAL LOW (ref 36.0–46.0)
Hemoglobin: 11.6 g/dL — ABNORMAL LOW (ref 12.0–15.0)
MCH: 29.8 pg (ref 26.0–34.0)
MCHC: 33.1 g/dL (ref 30.0–36.0)
MCV: 90 fL (ref 78.0–100.0)
PLATELETS: 65 10*3/uL — AB (ref 150–400)
RBC: 3.89 MIL/uL (ref 3.87–5.11)
RDW: 13 % (ref 11.5–15.5)
WBC: 3 10*3/uL — ABNORMAL LOW (ref 4.0–10.5)

## 2014-07-18 MED ORDER — ONDANSETRON HCL 4 MG/2ML IJ SOLN
4.0000 mg | INTRAMUSCULAR | Status: DC | PRN
  Administered 2014-07-18 – 2014-07-19 (×3): 4 mg via INTRAVENOUS
  Filled 2014-07-18 (×3): qty 2

## 2014-07-18 NOTE — Plan of Care (Signed)
Problem: Phase I Progression Outcomes Goal: Pain controlled with appropriate interventions Outcome: Completed/Met Date Met:  07/18/14 Goal: OOB as tolerated unless otherwise ordered Outcome: Completed/Met Date Met:  07/18/14 Goal: Initial discharge plan identified Outcome: Completed/Met Date Met:  07/18/14 Goal: Voiding-avoid urinary catheter unless indicated Outcome: Completed/Met Date Met:  07/18/14

## 2014-07-18 NOTE — Progress Notes (Signed)
[facility] PROGRESS NOTE  Dorothy Wood [id number] DOB: Apr 18, 1961 DOA: 07/16/2014 PCP: Wyatt Haste, MD   Brief narrative 52 year old female with history of hyper thyroidism who recently went on a trip to [place] for 11 days  developed fever of 102-104F with generalized joint pains and fatigue about 4 days after returning from her trip. Patient found to have leukopenia and thrombocytopenia. Initial malaria smear was negative. Cultures so far negative. Infectious disease consulted given high suspicion for dengue fever.   Assessment/Plan: Fever of unknown origin with leukopenia, thrombocytopenia High suspicion for dengue with ? hemorrhagic fever. Patient has developed generalized rash over her chest, abdomen and the back which increases suspicion for dengue even more. Has not had a temperature spike and last 24 hours. (Having low-grade fever.) flu  PCR, blood cultures negative.antibodies sent. -WBC slowly improving. Platelets slightly drop to 65 today. -supportive care with IV fluids and pain medications. -monitor CBC daily.  Depression Continue SSRI  Hypothyroidism Continue Synthroid  DVT prophylaxis: SCD  Diet: Regular  Code Status:full code Family Communication: none at bedside Disposition Plan:home likely tomorrow if symptoms improve and remains afebrile   Consultants:  ID  Procedures:  none  Antibiotics:  none  HPI/Subjective: Patient seen and examined. Reports feeling dizzy and having pain in her joints and feeling weak.  Objective: Filed Vitals:   07/18/14 0654  BP: 97/57  Pulse: 74  Temp: 99.6 F (37.6 C)  Resp: 18    Intake/Output Summary (Last 24 hours) at 07/18/14 1419 Last data filed at 07/18/14 0200  Gross per 24 hour  Intake   1275 ml  Output      0 ml  Net   1275 ml   Filed Weights   07/16/14 1300  Weight: 59.4 kg (130 lb 15.3 oz)    Exam:   General:  Middle aged female lying in bed appears fatigued  HEENT:no  pallor, moist oral mucosa  Chest: Clear to auscultation bilaterally, no added sounds  CVS: Normal S1 and S2, no murmurs  Abdomen: Soft, nontender, nondistended, bowel sounds present  Extremities: Warm with diffuse erythema over the abdomen and the back, no edema  CNS: Alert and oriented   Data Reviewed: Basic Metabolic Panel:  Recent Labs Lab 07/13/14 1058 07/16/14 1549 07/17/14 0517  NA 135 133* 139  K 3.9 4.0 4.1  CL 101 98 102  CO2 22 24 27   GLUCOSE 79 117* 94  BUN 10 13 10   CREATININE 0.84 0.85 0.92  CALCIUM 8.8 8.2* 7.8*   Liver Function Tests:  Recent Labs Lab 07/13/14 1058 07/16/14 1549 07/17/14 0517  AST 45* 60* 69*  ALT 60* 52* 57*  ALKPHOS 58 64 60  BILITOT 0.2 <0.2* <0.2*  PROT 6.4 6.0 5.6*  ALBUMIN 4.2 3.3* 3.0*   No results for input(s): LIPASE, AMYLASE in the last 168 hours. No results for input(s): AMMONIA in the last 168 hours. CBC:  Recent Labs Lab 07/13/14 1058 07/16/14 1549 07/17/14 0517 07/18/14 0930  WBC 3.4* 1.6* 2.2* 3.0*  NEUTROABS 2.8 1.0*  --   --   HGB 12.6 12.7 12.9 11.6*  HCT 36.7 38.8 39.9 35.0*  MCV 87.0 89.4 91.1 90.0  PLT 171 86* 78* 65*   Cardiac Enzymes: No results for input(s): CKTOTAL, CKMB, CKMBINDEX, TROPONINI in the last 168 hours. BNP (last 3 results) No results for input(s): PROBNP in the last 8760 hours. CBG: No results for input(s): GLUCAP in the last 168 hours.  Recent Results (from the past  240 hour(s))  Culture, blood (single)     Status: None (Preliminary result)   Collection Time: 07/13/14 10:58 AM  Result Value Ref Range Status   Preliminary Report Blood Culture received; No Growth to date;  Preliminary   Preliminary Report Culture will be held for 5 days before issuing  Preliminary   Preliminary Report a Final Negative report.  Preliminary  Culture, blood (single)     Status: None (Preliminary result)   Collection Time: 07/13/14 11:40 AM  Result Value Ref Range Status   Preliminary Report  Blood Culture received; No Growth to date;  Preliminary   Preliminary Report Culture will be held for 5 days before issuing  Preliminary   Preliminary Report a Final Negative report.  Preliminary  Malaria smear     Status: None   Collection Time: 07/13/14 12:07 PM  Result Value Ref Range Status   Result - MALAR No Plasmodium or Other Blood Parasites Seen  Final   Result - MALAR on Thick or Thin Smears  Final   Result - MALAR For persons strongly suspected of having a  Final   Result - MALAR   Final    blood parasite,but have negative smears, it is recommended   Result - MALAR   Final    that blood films be repeated approximately every 12 to 24   Result - MALAR hours for 3 consecutive days.  Final     Studies: No results found.  Scheduled Meds: . buPROPion  300 mg Oral Daily  . docusate sodium  100 mg Oral BID  . levothyroxine  50 mcg Oral QAC breakfast  . pilocarpine  5 mg Oral QID   Continuous Infusions: . sodium chloride 100 mL/hr at 07/18/14 1231      Time spent:25 minutes    Zahki Hoogendoorn, [place]  [facility] Pager [phone]. If 7PM-7AM, please contact night-coverage at [email or web address], password [facility] 07/18/2014, 2:19 PM  LOS: 2 days

## 2014-07-19 DIAGNOSIS — D61818 Other pancytopenia: Secondary | ICD-10-CM

## 2014-07-19 LAB — CULTURE, BLOOD (SINGLE)
ORGANISM ID, BACTERIA: NO GROWTH
Organism ID, Bacteria: NO GROWTH

## 2014-07-19 LAB — CBC WITH DIFFERENTIAL/PLATELET
BASOS ABS: 0.1 10*3/uL (ref 0.0–0.1)
Basophils Relative: 2 % — ABNORMAL HIGH (ref 0–1)
EOS ABS: 0.1 10*3/uL (ref 0.0–0.7)
Eosinophils Relative: 2 % (ref 0–5)
HCT: 32.8 % — ABNORMAL LOW (ref 36.0–46.0)
Hemoglobin: 10.8 g/dL — ABNORMAL LOW (ref 12.0–15.0)
LYMPHS PCT: 36 % (ref 12–46)
Lymphs Abs: 1.4 10*3/uL (ref 0.7–4.0)
MCH: 29.5 pg (ref 26.0–34.0)
MCHC: 32.9 g/dL (ref 30.0–36.0)
MCV: 89.6 fL (ref 78.0–100.0)
Monocytes Absolute: 0.4 10*3/uL (ref 0.1–1.0)
Monocytes Relative: 11 % (ref 3–12)
Neutro Abs: 1.9 10*3/uL (ref 1.7–7.7)
Neutrophils Relative %: 49 % (ref 43–77)
PLATELETS: 73 10*3/uL — AB (ref 150–400)
RBC: 3.66 MIL/uL — ABNORMAL LOW (ref 3.87–5.11)
RDW: 13 % (ref 11.5–15.5)
WBC: 3.9 10*3/uL — ABNORMAL LOW (ref 4.0–10.5)

## 2014-07-19 MED ORDER — OXYCODONE HCL 5 MG PO TABS: 5.0000 mg | ORAL_TABLET | Freq: Four times a day (QID) | ORAL | Status: DC | PRN

## 2014-07-19 MED ORDER — ONDANSETRON HCL 4 MG PO TABS: 4.0000 mg | ORAL_TABLET | Freq: Four times a day (QID) | ORAL | Status: DC | PRN

## 2014-07-19 MED ORDER — ONDANSETRON 4 MG PO TBDP
4.0000 mg | ORAL_TABLET | Freq: Once | ORAL | Status: AC
  Administered 2014-07-19: 4 mg via ORAL
  Filled 2014-07-19: qty 1

## 2014-07-19 NOTE — Discharge Summary (Signed)
Physician Discharge Summary  Dorothy Wood [id number]:[id number] DOB: [birth year] DOA: 07/16/2014  PCP: Wyatt Haste, MD  Admit date: 07/16/2014 Discharge date: 07/19/2014  Time spent: 30 minutes  Recommendations for Outpatient Follow-up:  1. Discharge home with outpatient PCP follow-up within 1 week. Needs repeat CBC checked. Please follow viral antibodies sent on admission.  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 Patient found to have leukopenia and thrombocytopenia. Initial malaria smear was negative. Cultures so far negative. Infectious disease consulted given high suspicion for dengue fever.   Hospital Course:  Fever of unknown origin with leukopenia, thrombocytopenia High suspicion for dengue with ? hemorrhagic fever. Patient  developed generalized rash over her chest, abdomen and the back after hospitalization which increases suspicion for dengue even more. Has not had a temperature spike and last 48 hours. Joint pains have been improving however patient still feels weak and nauseous. flu PCR, blood cultures negative.antibodies sent. -WBC slowly improving. Platelets still low at 73 however has not dropped further. -patient showing clinical improvement with supportive care.rash over the trunk has mostly resolved. -she is stable for discharge home and needs to follow-up with her PCP  within 1 week and have her CBC checked. Viral antibodies sent this admission may still be followed as outpatient.I will give her some pain medications and antiemetics upon discharge. -Patient instructed to call her PCP or return to the ED if she has fevers with chills, worsened malaise and joint pains, lethargic, altered mental status or any signs of bleeding. She hasn't advised to avoid NSAIDs or Pepcid to prevent worsening thrombocytopenia.  Depression Continue SSRI  Hypothyroidism Continue Synthroid      Code Status:full code Family Communication: daughter at bedside Disposition Plan:home with outpatient follow-up   Consultants: ID(Dr. Linus Salmons) Procedures:  none  Antibiotics:  none  Discharge Exam: Filed Vitals:   07/19/14 0933  BP: 116/65  Pulse: 73  Temp: 98.6 F (37 C)  Resp: 18    General: middle aged thin built female in no acute distress HEENT: No pallor, moist oral mucosa Chest: Clear to auscultation bilaterally, improved rash over the trunk ( almost resolves) Cardiovascular: normal S1 and S2, no murmurs Abdomen: soft, nontender, nondistended Extremities: Warm, no edema, no joint swelling or tenderness   Discharge Instructions You were cared for by a hospitalist during your hospital stay. If you have any questions about your discharge medications or the care you received while you were in the hospital after you are discharged, you can call the unit and asked to speak with the hospitalist on call if the hospitalist that took care of you is not available. Once you are discharged, your primary care physician will handle any further medical issues. Please note that NO REFILLS for any discharge medications will be authorized once you are discharged, as it is imperative that you return to your primary care physician (or establish a  relationship with a primary care physician if you do not have one) for your aftercare needs so that they can reassess your need for  medications and monitor your lab values.   Current Discharge Medication List    START taking these medications   Details  ondansetron (ZOFRAN) 4 MG tablet Take 1 tablet (4 mg total) by mouth every 6 (six) hours as needed for nausea or vomiting. Qty: 30 tablet, Refills: 0    oxyCODONE (OXY IR/ROXICODONE) 5 MG immediate release tablet Take 1 tablet (5 mg total) by mouth every 6 (six) hours as needed for moderate pain. Qty: 30 tablet, Refills: 0      CONTINUE these medications which have NOT CHANGED   Details  buPROPion (WELLBUTRIN XL) 300 MG 24 hr tablet TAKE 1 TABLET BY MOUTH ONCE DAILY Qty: 90 tablet, Refills: 3    estradiol (ESTRACE) 1 MG tablet Take 1 mg by mouth daily.    levothyroxine (SYNTHROID, LEVOTHROID) 50 MCG tablet Take 50 mcg by mouth daily.    methylphenidate (RITALIN) 20 MG tablet Take 1 tablet (20 mg total) by mouth 3 (three) times daily. Qty: 270 tablet, Refills: 0   Associated Diagnoses: ADD (attention deficit disorder)    pilocarpine (SALAGEN) 5 MG tablet Take 5 mg by mouth 4 (four) times daily.      STOP taking these medications     atovaquone-proguanil (MALARONE) 250-100 MG TABS      ibuprofen (ADVIL,MOTRIN) 200 MG tablet        No Known Allergies Follow-up Information    Follow up with Wyatt Haste, MD. Schedule an appointment as soon as possible for a visit in 1 week.   Specialty:  Family Medicine   Contact information:   [place] [address] [phone]        The results of significant diagnostics from this hospitalization (including imaging, microbiology, ancillary and laboratory) are listed below for reference.    Significant Diagnostic Studies: No results found.  Microbiology: Recent Results (from the past 240 hour(s))  Culture, blood (single)     Status: None   Collection Time: 07/13/14 11:58 AM  Result Value Ref Range Status   Organism ID, Bacteria NO GROWTH 5 DAYS  Final  Malaria smear      Status: None   Collection Time: 07/13/14 12:07 PM  Result Value Ref Range Status   Result - MALAR No Plasmodium or Other Blood Parasites Seen  Final   Result - MALAR on Thick or Thin Smears  Final   Result - MALAR For persons strongly suspected of having a  Final   Result - MALAR   Final    blood parasite,but have negative smears, it is recommended   Result - MALAR   Final    that blood films be repeated approximately every 12 to 24   Result - MALAR hours for 3 consecutive days.  Final  Culture, blood (single)     Status: None   Collection Time: 07/13/14 12:40 PM  Result Value Ref Range Status   Organism ID, Bacteria NO GROWTH 5 DAYS  Final     Labs: Basic Metabolic Panel:  Recent Labs Lab 07/13/14 1058 07/16/14 1549 07/17/14 0517  NA 135 133* 139  K 3.9 4.0 4.1  CL 101 98 102  CO2 22 24 27   GLUCOSE 79 117* 94  BUN 10 13 10   CREATININE 0.84 0.85 0.92  CALCIUM 8.8 8.2* 7.8*   Liver Function Tests:  Recent Labs Lab 07/13/14  1058 07/16/14 1549 07/17/14 0517  AST 45* 60* 69*  ALT 60* 52* 57*  ALKPHOS 58 64 60  BILITOT 0.2 <0.2* <0.2*  PROT 6.4 6.0 5.6*  ALBUMIN 4.2 3.3* 3.0*   No results for input(s): LIPASE, AMYLASE in the last 168 hours. No results for input(s): AMMONIA in the last 168 hours. CBC:  Recent Labs Lab 07/13/14 1058 07/16/14 1549 07/17/14 0517 07/18/14 0930 07/19/14 0555  WBC 3.4* 1.6* 2.2* 3.0* 3.9*  NEUTROABS 2.8 1.0*  --   --  1.9  HGB 12.6 12.7 12.9 11.6* 10.8*  HCT 36.7 38.8 39.9 35.0* 32.8*  MCV 87.0 89.4 91.1 90.0 89.6  PLT 171 86* 78* 65* 73*   Cardiac Enzymes: No results for input(s): CKTOTAL, CKMB, CKMBINDEX, TROPONINI in the last 168 hours. BNP: BNP (last 3 results) No results for input(s): PROBNP in the last 8760 hours. CBG: No results for input(s): GLUCAP in the last 168 hours.     SignedLouellen Molder  [facility] 07/19/2014, 10:39 AM

## 2014-07-23 ENCOUNTER — Ambulatory Visit (INDEPENDENT_AMBULATORY_CARE_PROVIDER_SITE_OTHER): Payer: 59 | Admitting: Family Medicine

## 2014-07-23 ENCOUNTER — Telehealth: Payer: Self-pay | Admitting: *Deleted

## 2014-07-23 ENCOUNTER — Telehealth: Payer: Self-pay | Admitting: Family Medicine

## 2014-07-23 ENCOUNTER — Encounter: Payer: Self-pay | Admitting: Family Medicine

## 2014-07-23 VITALS — BP 110/70 | HR 82 | Wt 125.0 lb

## 2014-07-23 DIAGNOSIS — A91 Dengue hemorrhagic fever: Secondary | ICD-10-CM

## 2014-07-23 DIAGNOSIS — D696 Thrombocytopenia, unspecified: Secondary | ICD-10-CM

## 2014-07-23 LAB — COMPREHENSIVE METABOLIC PANEL
ALK PHOS: 110 U/L (ref 39–117)
ALT: 231 U/L — AB (ref 0–35)
AST: 71 U/L — ABNORMAL HIGH (ref 0–37)
Albumin: 4.1 g/dL (ref 3.5–5.2)
BILIRUBIN TOTAL: 0.4 mg/dL (ref 0.2–1.2)
BUN: 10 mg/dL (ref 6–23)
CO2: 28 mEq/L (ref 19–32)
Calcium: 9.7 mg/dL (ref 8.4–10.5)
Chloride: 99 mEq/L (ref 96–112)
Creat: 0.75 mg/dL (ref 0.50–1.10)
Glucose, Bld: 79 mg/dL (ref 70–99)
Potassium: 4.6 mEq/L (ref 3.5–5.3)
SODIUM: 139 meq/L (ref 135–145)
TOTAL PROTEIN: 6.4 g/dL (ref 6.0–8.3)

## 2014-07-23 LAB — CBC WITH DIFFERENTIAL/PLATELET
BASOS PCT: 1 % (ref 0–1)
Basophils Absolute: 0.1 10*3/uL (ref 0.0–0.1)
Eosinophils Absolute: 0.1 10*3/uL (ref 0.0–0.7)
Eosinophils Relative: 1 % (ref 0–5)
HCT: 40.8 % (ref 36.0–46.0)
HEMOGLOBIN: 13.6 g/dL (ref 12.0–15.0)
Lymphocytes Relative: 29 % (ref 12–46)
Lymphs Abs: 2 10*3/uL (ref 0.7–4.0)
MCH: 29.2 pg (ref 26.0–34.0)
MCHC: 33.3 g/dL (ref 30.0–36.0)
MCV: 87.6 fL (ref 78.0–100.0)
MONOS PCT: 16 % — AB (ref 3–12)
Monocytes Absolute: 1.1 10*3/uL — ABNORMAL HIGH (ref 0.1–1.0)
NEUTROS ABS: 3.7 10*3/uL (ref 1.7–7.7)
NEUTROS PCT: 53 % (ref 43–77)
Platelets: 372 10*3/uL (ref 150–400)
RBC: 4.66 MIL/uL (ref 3.87–5.11)
RDW: 13.5 % (ref 11.5–15.5)
WBC: 7 10*3/uL (ref 4.0–10.5)

## 2014-07-23 NOTE — Telephone Encounter (Signed)
lm

## 2014-07-23 NOTE — Telephone Encounter (Signed)
Left the message with [employer].

## 2014-07-23 NOTE — Telephone Encounter (Signed)
FMLAs are taken care of by primary doctors. thanks

## 2014-07-23 NOTE — Progress Notes (Signed)
   Subjective:    Patient ID: Dorothy Wood, female    DOB: [birth year], 53 y.o.   MRN: [id number]  HPI She is here for a posthospitalization visit. She was diagnosed with dengue fever. She did develop a rash and did show other liver and blood abnormalities. Price she is feeling much better but certainly not ready to return to work. No fever, chills but is having difficulty with dizziness. She is using pressure points on both wrists which she states helps alleviate the dizziness.   Review of Systems     Objective:   Physical Exam alert and in no distress. Tympanic membranes and canals are normal. Throat is clear. Tonsils are normal. Neck is supple without adenopathy or thyromegaly. Cardiac exam shows a regular sinus rhythm without murmurs or gallops. Lungs are clear to auscultation.abdominal exam shows normal bowel sounds without hepatosplenomegaly.        Assessment & Plan:  Dengue hemorrhagic fever - Plan: CBC with Differential, Comprehensive metabolic panel  Thrombocytopenia - Plan: CBC with Differential, Comprehensive metabolic panel I will fill out appropriate paperwork for short-term disability. She will touch bases with me in 2 weeks and let me know how she is doing. I am comfortable with the fact that she will return to work when she is physically able.

## 2014-07-23 NOTE — Telephone Encounter (Signed)
Sheria Lang from [employer] is calling requesting letter for [name] that addresses the disease diagnosis, process, and length.  Please advise.  Patient has not yet been seen in clinic. Landis Gandy, RN

## 2014-07-24 ENCOUNTER — Encounter: Payer: Self-pay | Admitting: Family Medicine

## 2014-07-24 ENCOUNTER — Encounter: Payer: Self-pay | Admitting: Internal Medicine

## 2014-07-24 LAB — MISCELLANEOUS TEST

## 2014-07-26 LAB — MISCELLANEOUS TEST

## 2014-07-30 ENCOUNTER — Telehealth: Payer: Self-pay | Admitting: *Deleted

## 2014-07-30 NOTE — Telephone Encounter (Signed)
Left message notifying patient that Dr. Linus Salmons signed a letter and printed information regarding Dengue Fever for her travel insurance policy as requested via Joycelyn Schmid.  Left message 11/13 looking for advice as to whether she would like to pick up the information or have it mailed to her residence.  Left message 11/16 stating that the documentation would be mailed to the address on her medical record.   Patient is to call back today if she would prefer otherwise. Landis Gandy, RN

## 2014-07-31 ENCOUNTER — Encounter: Payer: Self-pay | Admitting: Family Medicine

## 2014-08-01 ENCOUNTER — Other Ambulatory Visit: Payer: Self-pay | Admitting: Family Medicine

## 2014-08-01 DIAGNOSIS — A90 Dengue fever [classical dengue]: Secondary | ICD-10-CM

## 2014-08-06 ENCOUNTER — Telehealth: Payer: Self-pay | Admitting: Family Medicine

## 2014-08-06 ENCOUNTER — Other Ambulatory Visit: Payer: 59

## 2014-08-06 DIAGNOSIS — A90 Dengue fever [classical dengue]: Secondary | ICD-10-CM

## 2014-08-06 NOTE — Telephone Encounter (Signed)
Pt dropped off a return to work form. Please complete and fax to number at the top of form, and then mail form to pt. Sending back in folder.

## 2014-08-07 LAB — COMPREHENSIVE METABOLIC PANEL
ALK PHOS: 59 U/L (ref 39–117)
ALT: 25 U/L (ref 0–35)
AST: 21 U/L (ref 0–37)
Albumin: 3.9 g/dL (ref 3.5–5.2)
BILIRUBIN TOTAL: 0.4 mg/dL (ref 0.2–1.2)
BUN: 8 mg/dL (ref 6–23)
CO2: 28 mEq/L (ref 19–32)
CREATININE: 0.69 mg/dL (ref 0.50–1.10)
Calcium: 8.9 mg/dL (ref 8.4–10.5)
Chloride: 103 mEq/L (ref 96–112)
GLUCOSE: 79 mg/dL (ref 70–99)
Potassium: 3.9 mEq/L (ref 3.5–5.3)
Sodium: 138 mEq/L (ref 135–145)
Total Protein: 5.9 g/dL — ABNORMAL LOW (ref 6.0–8.3)

## 2014-10-09 ENCOUNTER — Encounter: Payer: Self-pay | Admitting: Family Medicine

## 2014-10-09 DIAGNOSIS — F988 Other specified behavioral and emotional disorders with onset usually occurring in childhood and adolescence: Secondary | ICD-10-CM

## 2014-10-24 ENCOUNTER — Other Ambulatory Visit: Payer: Self-pay | Admitting: Family Medicine

## 2014-10-24 NOTE — Telephone Encounter (Signed)
Dr.lalonde is this okay 

## 2014-12-18 ENCOUNTER — Encounter: Payer: Self-pay | Admitting: Family Medicine

## 2014-12-18 MED ORDER — METHYLPHENIDATE HCL 20 MG PO TABS: 20.0000 mg | ORAL_TABLET | Freq: Three times a day (TID) | ORAL | Status: DC

## 2014-12-19 NOTE — Telephone Encounter (Signed)
Pt was called and informed RX is ready for pick up.

## 2015-02-10 ENCOUNTER — Telehealth: Payer: 59 | Admitting: Family

## 2015-02-10 DIAGNOSIS — B9689 Other specified bacterial agents as the cause of diseases classified elsewhere: Secondary | ICD-10-CM

## 2015-02-10 DIAGNOSIS — J208 Acute bronchitis due to other specified organisms: Principal | ICD-10-CM

## 2015-02-10 MED ORDER — AZITHROMYCIN 250 MG PO TABS: ORAL_TABLET | ORAL | Status: DC

## 2015-02-10 MED ORDER — BENZONATATE 100 MG PO CAPS: 100.0000 mg | ORAL_CAPSULE | Freq: Three times a day (TID) | ORAL | Status: DC | PRN

## 2015-02-10 NOTE — Progress Notes (Signed)
We are sorry that you are not feeling well.  Here is how we plan to help!  Based on what you have shared with me it looks like you have upper respiratory tract inflammation that has resulted in a significant cough.  Inflammation and infection in the upper respiratory tract is commonly called bronchitis and has four common causes:  Allergies, Viral Infections, Acid Reflux and Bacterial Infections.  Allergies, viruses and acid reflux are treated by controlling symptoms or eliminating the cause. An example might be a cough caused by taking certain blood pressure medications. You stop the cough by changing the medication. Another example might be a cough caused by acid reflux. Controlling the reflux helps control the cough.  Based on your presentation I believe you most likely have A cough due to bacteria.  When patients have a fever and a productive cough with a change in color or increased sputum production, we are concerned about bacterial bronchitis.  If left untreated it can progress to pneumonia.  If your symptoms do not improve with your treatment plan it is important that you contact your provider.   I have prescribed Azithromyin 250 mg: two tables now and then one tablet daily for 4 additonal days   In addition you may use A non-prescription cough medication called Mucinex DM: take 2 tablets every 12 hours. and A prescription cough medication called Tessalon Perles 100mg . You may take 1-2 capsules every 8 hours as needed for your cough.    HOME CARE . Only take medications as instructed by your medical team. . Complete the entire course of an antibiotic. . Drink plenty of fluids and get plenty of rest. . Avoid close contacts especially the very young and the elderly . Cover your mouth if you cough or cough into your sleeve. . Always remember to wash your hands . A steam or ultrasonic humidifier can help congestion.    GET HELP RIGHT AWAY IF: . You develop worsening fever. . You become short  of breath . You cough up blood. . Your symptoms persist after you have completed your treatment plan MAKE SURE YOU   Understand these instructions.  Will watch your condition.  Will get help right away if you are not doing well or get worse.  Your e-visit answers were reviewed by a board certified advanced clinical practitioner to complete your personal care plan.  Depending on the condition, your plan could have included both over the counter or prescription medications.  If there is a problem please reply  once you have received a response from your provider.  Your safety is important to [place].  If you have drug allergies check your prescription carefully.    You can use MyChart to ask questions about today's visit, request a non-urgent call back, or ask for a work or school excuse.  You will get an e-mail in the next two days asking about your experience.  I hope that your e-visit has been valuable and will speed your recovery. Thank you for using e-visits.

## 2015-03-28 ENCOUNTER — Ambulatory Visit (HOSPITAL_COMMUNITY)
Admission: RE | Admit: 2015-03-28 | Discharge: 2015-03-28 | Disposition: A | Discharge status: Home / Self Care | Payer: 59 | Source: Ambulatory Visit | Attending: Obstetrics & Gynecology | Admitting: Obstetrics & Gynecology

## 2015-03-28 ENCOUNTER — Other Ambulatory Visit (HOSPITAL_COMMUNITY): Payer: Self-pay | Admitting: Obstetrics & Gynecology

## 2015-03-28 DIAGNOSIS — Z1231 Encounter for screening mammogram for malignant neoplasm of breast: Secondary | ICD-10-CM | POA: Insufficient documentation

## 2015-03-28 IMAGING — MG MM SCREENING BREAST TOMO BILATERAL
8 series · 9 of 24 positions shown · non-contrast
Comparison: Previous exam(s).

CLINICAL DATA: Screening.

EXAM:
DIGITAL SCREENING BILATERAL MAMMOGRAM WITH 3D TOMO WITH CAD

[L MLO]
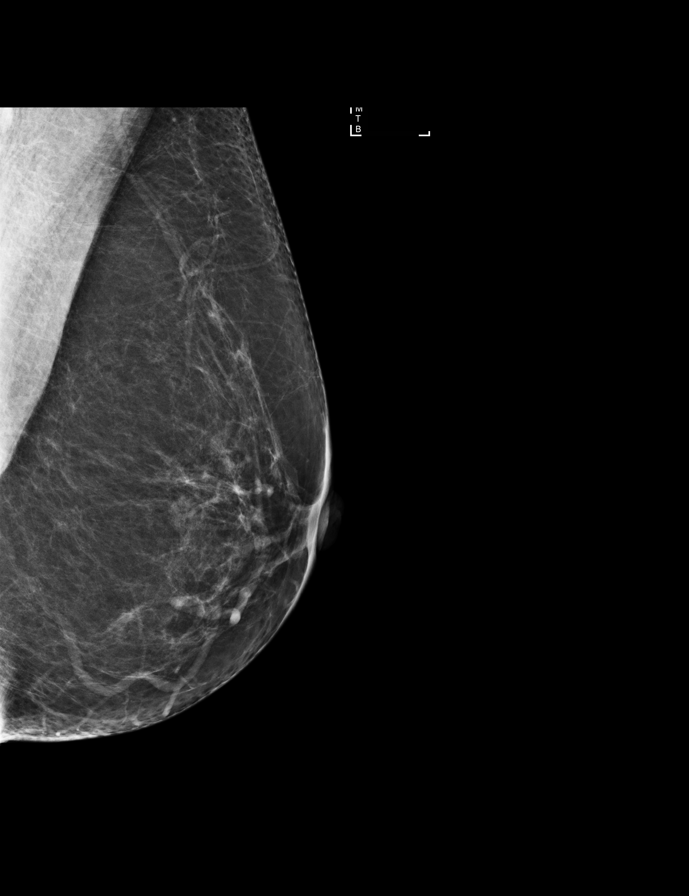

[R MLO]
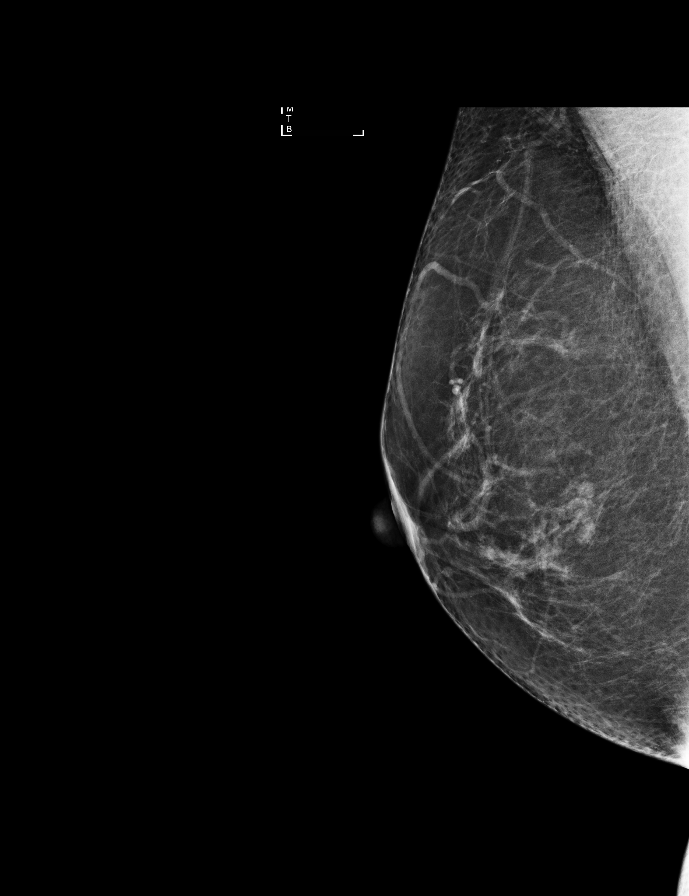

[L CC]
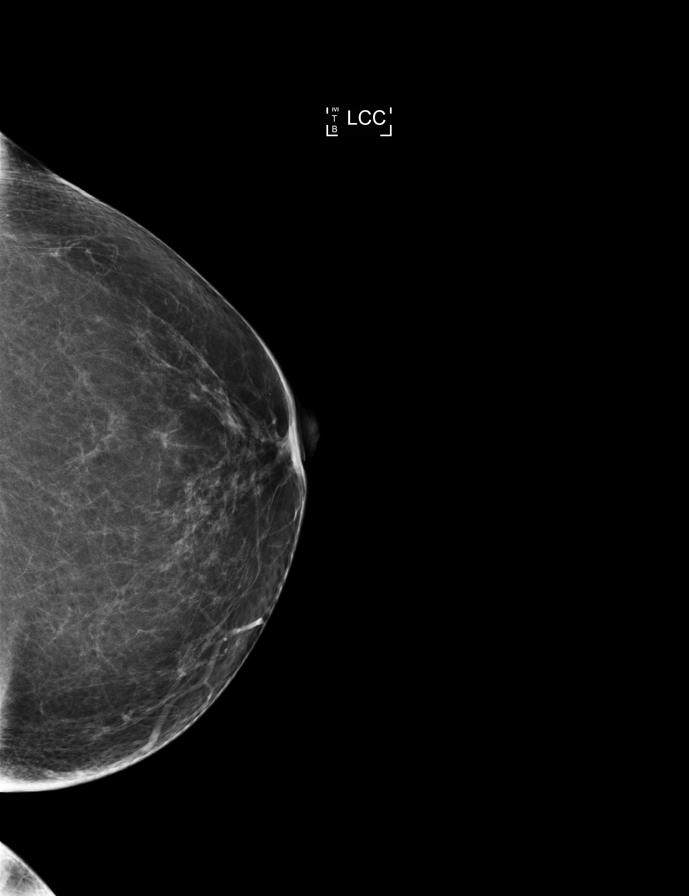

[R CC]
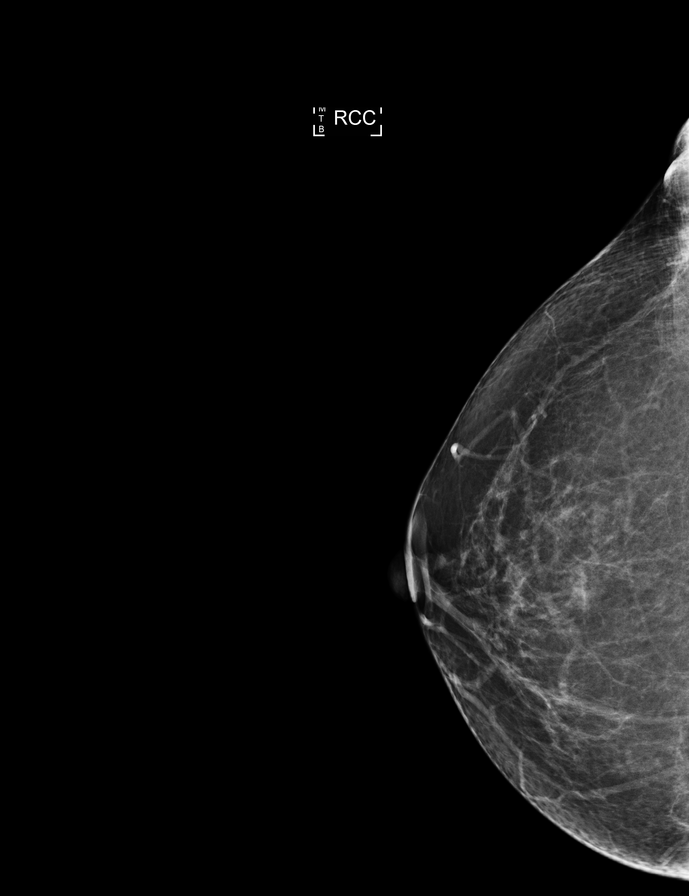

[L MLO tomo · 2 of 92 frames shown]
[frame 30/92]
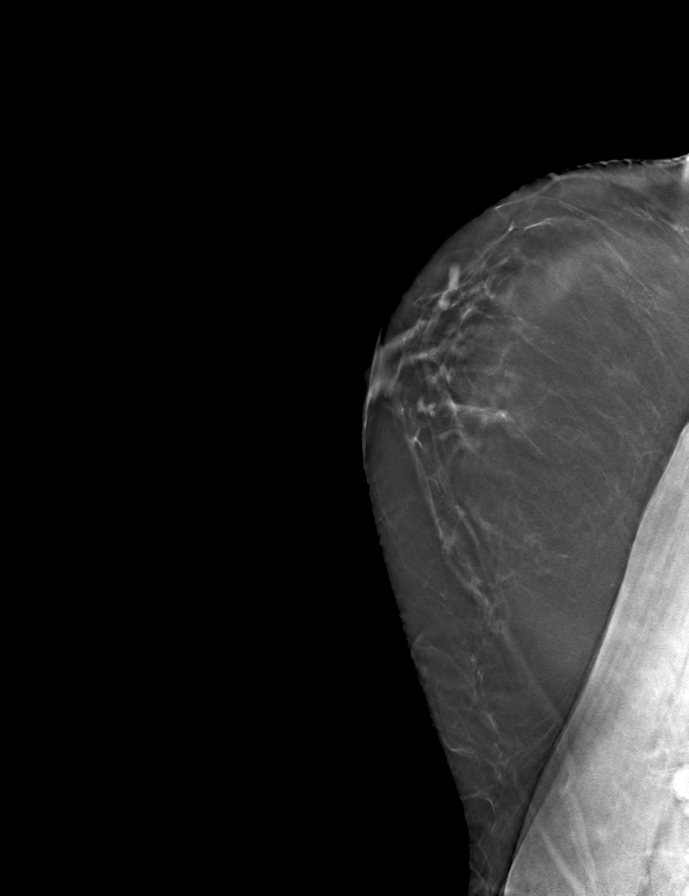
[frame 47/92]
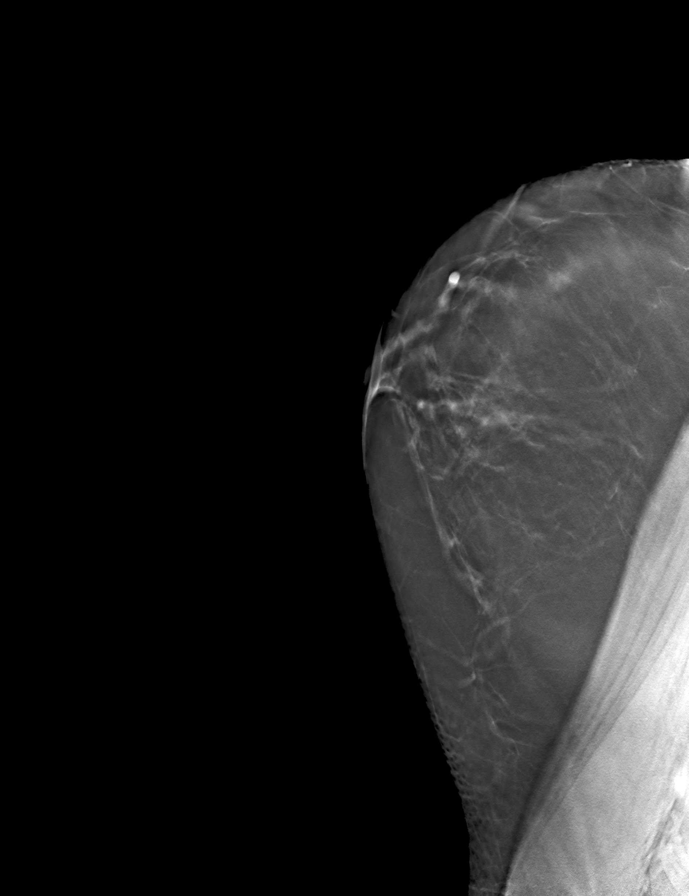

[R CC tomo · tomo slice 45/90.0]
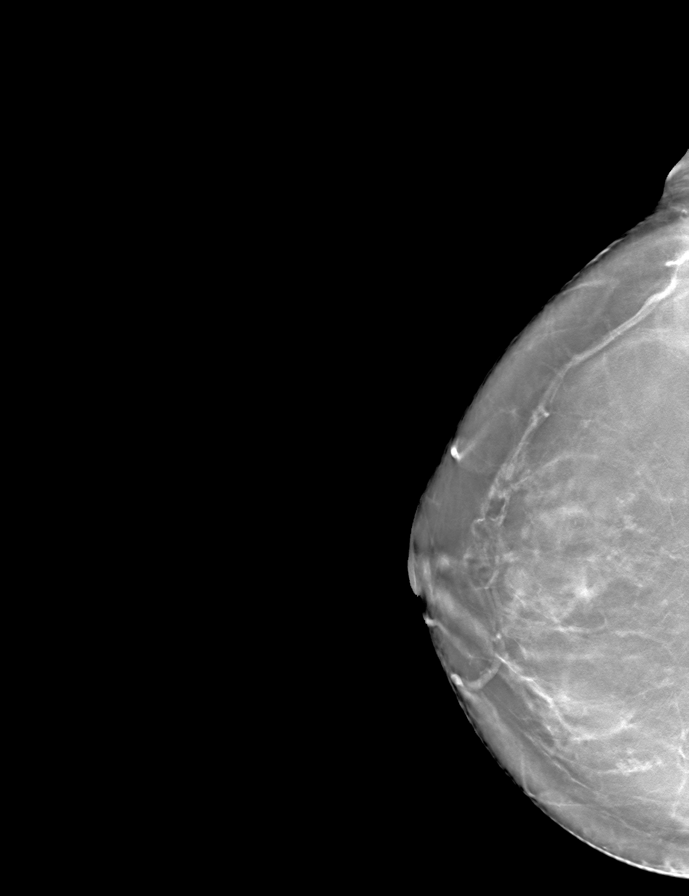

[L CC tomo · tomo slice 46/91.0]
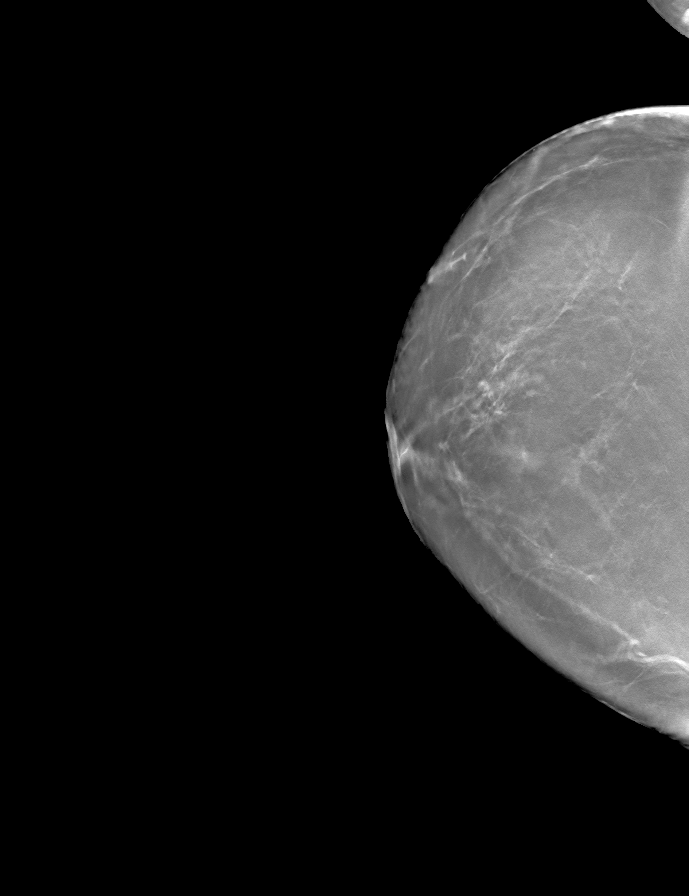

[R MLO tomo · tomo slice 45/90.0]
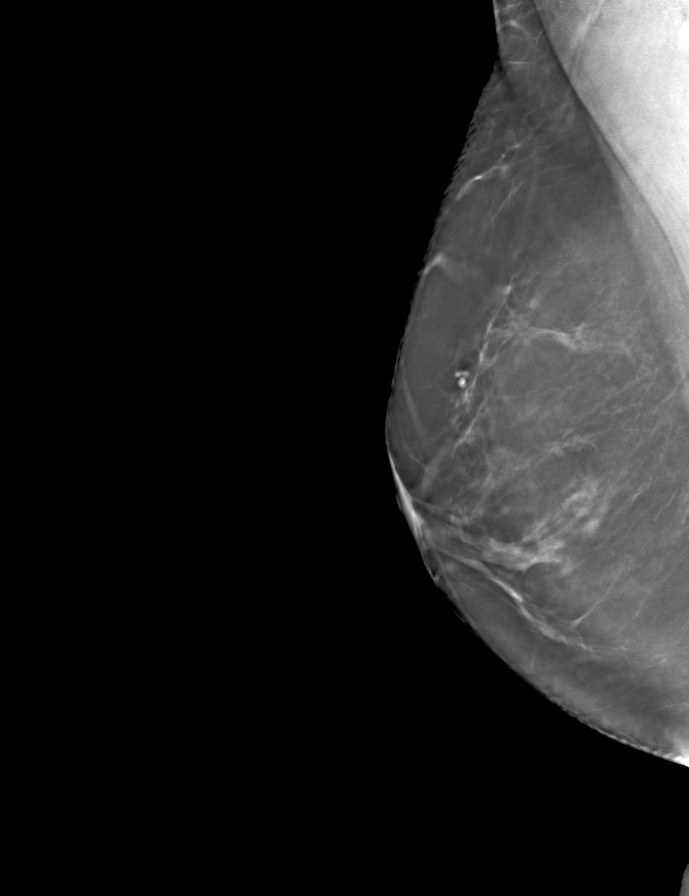

[9 of 24 positions shown; findings below may reference images not displayed]

ACR Breast Density Category b: There are scattered areas of
fibroglandular density.
FINDINGS: There are no findings suspicious for malignancy. Images were
processed with CAD.
IMPRESSION: No mammographic evidence of malignancy. A result letter of this
screening mammogram will be mailed directly to the patient.

RECOMMENDATION:
Screening mammogram in one year. (Code:[F0])

BI-RADS CATEGORY  1: Negative.

## 2015-06-27 ENCOUNTER — Encounter: Payer: Self-pay | Admitting: Family Medicine

## 2015-07-09 ENCOUNTER — Encounter: Payer: Self-pay | Admitting: Family Medicine

## 2015-07-09 ENCOUNTER — Ambulatory Visit (INDEPENDENT_AMBULATORY_CARE_PROVIDER_SITE_OTHER): Payer: 59 | Admitting: Family Medicine

## 2015-07-09 VITALS — BP 116/70 | HR 77 | Ht 65.0 in | Wt 134.0 lb

## 2015-07-09 DIAGNOSIS — Z8543 Personal history of malignant neoplasm of ovary: Secondary | ICD-10-CM | POA: Diagnosis not present

## 2015-07-09 DIAGNOSIS — F909 Attention-deficit hyperactivity disorder, unspecified type: Secondary | ICD-10-CM

## 2015-07-09 DIAGNOSIS — F329 Major depressive disorder, single episode, unspecified: Secondary | ICD-10-CM

## 2015-07-09 DIAGNOSIS — F988 Other specified behavioral and emotional disorders with onset usually occurring in childhood and adolescence: Secondary | ICD-10-CM

## 2015-07-09 DIAGNOSIS — F32A Depression, unspecified: Secondary | ICD-10-CM

## 2015-07-09 MED ORDER — BUPROPION HCL ER (XL) 300 MG PO TB24: ORAL_TABLET | ORAL | Status: DC

## 2015-07-09 MED ORDER — METHYLPHENIDATE HCL 20 MG PO TABS: 20.0000 mg | ORAL_TABLET | Freq: Three times a day (TID) | ORAL | Status: DC

## 2015-07-09 NOTE — Progress Notes (Signed)
   Subjective:    Patient ID: [place] Cellar, female    DOB: Feb 04, 1961, 54 y.o.   MRN: [id number]  HPI  she is here for recheck on her medications. She does have underlying ADD and does use Ritalin 3 times per day. It lasts roughly 4 hours and helps with focus. She does not necessarily always take it 3 times per day. She also continues on Wellbutrin and is doing quite nicely on this medication. She does have a previous history of ovarian cancer over a decade ago. She is followed by her gynecologist for this we'll also renew his her other medications specifically Synthroid.   Review of Systems     Objective:   Physical Exam  alert and in no distress otherwise not examined       Assessment & Plan:  ADD (attention deficit disorder) - Plan: DISCONTINUED: methylphenidate (RITALIN) 20 MG tablet, DISCONTINUED: methylphenidate (RITALIN) 20 MG tablet  Depression - Plan: buPROPion (WELLBUTRIN XL) 300 MG 24 hr tablet  History of ovarian cancer  she is doing very well on her present medication regimen. I will continue them as needed.

## 2015-09-10 ENCOUNTER — Telehealth: Payer: Self-pay | Admitting: Family Medicine

## 2015-10-25 MED FILL — LEVOTHYROXINE 75 MCG TABLET: 75 | 90 days supply | Qty: 90 | Fill #3

## 2015-10-30 MED FILL — LEVOTHYROXINE 75 MCG TABLET: 75 | 30 days supply | Qty: 30 | Fill #4

## 2015-10-30 MED FILL — PILOCARPINE HCL 5 MG TABLET: 5 | 90 days supply | Qty: 360 | Fill #1

## 2015-11-15 ENCOUNTER — Other Ambulatory Visit: Payer: Self-pay | Admitting: Family Medicine

## 2015-11-18 MED FILL — BUPROPION HCL XL 300 MG TAB: 300 | 90 days supply | Qty: 90 | Fill #0

## 2015-11-20 ENCOUNTER — Encounter: Payer: Self-pay | Admitting: Family Medicine

## 2015-11-20 MED ORDER — METHYLPHENIDATE HCL 20 MG PO TABS: 20.0000 mg | ORAL_TABLET | Freq: Three times a day (TID) | ORAL | Status: DC

## 2015-12-16 MED FILL — ESTRADIOL 1 MG TABLET: 1 | 90 days supply | Qty: 90 | Fill #0

## 2015-12-17 MED FILL — EEMT DS 1.25-2.5 MG TABLET: 1.25-2.5 | 90 days supply | Qty: 90 | Fill #0

## 2016-01-15 MED FILL — METHYLPHENIDATE 20 MG TAB: 20 | 90 days supply | Qty: 270 | Fill #0

## 2016-02-11 MED FILL — AMOXICILLIN 500 MG CAPSULE: 500 | 3 days supply | Qty: 12 | Fill #0

## 2016-02-14 MED FILL — LEVOTHYROXINE 75 MCG TABLET: 75 | 90 days supply | Qty: 90 | Fill #0

## 2016-02-17 MED FILL — AMOXICILLIN 500 MG CAPSULE: 500 | 4 days supply | Qty: 16 | Fill #0

## 2016-03-02 MED FILL — BUPROPION HCL XL 300 MG TAB: 300 | 90 days supply | Qty: 90 | Fill #1

## 2016-03-04 MED FILL — AMOXICILLIN 500 MG CAPSULE: 500 | 3 days supply | Qty: 12 | Fill #0

## 2016-03-16 MED FILL — PILOCARPINE HCL 5 MG TABLET: 5 | 90 days supply | Qty: 360 | Fill #0

## 2016-03-16 MED FILL — EEMT DS 1.25-2.5 MG TABLET: 1.25-2.5 | 90 days supply | Qty: 90 | Fill #1

## 2016-03-23 MED FILL — ESTRADIOL 1 MG TABLET: 1 | 90 days supply | Qty: 90 | Fill #1

## 2016-04-07 DIAGNOSIS — R319 Hematuria, unspecified: Secondary | ICD-10-CM | POA: Diagnosis not present

## 2016-04-07 DIAGNOSIS — Z01419 Encounter for gynecological examination (general) (routine) without abnormal findings: Secondary | ICD-10-CM | POA: Diagnosis not present

## 2016-04-07 DIAGNOSIS — Z01411 Encounter for gynecological examination (general) (routine) with abnormal findings: Secondary | ICD-10-CM | POA: Diagnosis not present

## 2016-04-07 DIAGNOSIS — E039 Hypothyroidism, unspecified: Secondary | ICD-10-CM | POA: Diagnosis not present

## 2016-04-07 DIAGNOSIS — Z6822 Body mass index (BMI) 22.0-22.9, adult: Secondary | ICD-10-CM | POA: Diagnosis not present

## 2016-04-10 ENCOUNTER — Encounter: Payer: Self-pay | Admitting: Family Medicine

## 2016-04-21 ENCOUNTER — Other Ambulatory Visit: Payer: Self-pay | Admitting: Obstetrics & Gynecology

## 2016-04-21 DIAGNOSIS — Z1231 Encounter for screening mammogram for malignant neoplasm of breast: Secondary | ICD-10-CM

## 2016-04-28 ENCOUNTER — Ambulatory Visit
Admission: RE | Admit: 2016-04-28 | Discharge: 2016-04-28 | Disposition: A | Discharge status: Home / Self Care | Payer: 59 | Source: Ambulatory Visit | Attending: Obstetrics & Gynecology | Admitting: Obstetrics & Gynecology

## 2016-04-28 DIAGNOSIS — Z1231 Encounter for screening mammogram for malignant neoplasm of breast: Secondary | ICD-10-CM | POA: Diagnosis not present

## 2016-04-28 IMAGING — MG 2D DIGITAL SCREENING BILATERAL MAMMOGRAM WITH CAD AND ADJUNCT TO
9 of 13 series · 9 of 29 positions shown · non-contrast
Comparison: Previous exam(s).

CLINICAL DATA: Screening.

EXAM:
2D DIGITAL SCREENING BILATERAL MAMMOGRAM WITH CAD AND ADJUNCT TOMO

[L MLO (1 of 2)]
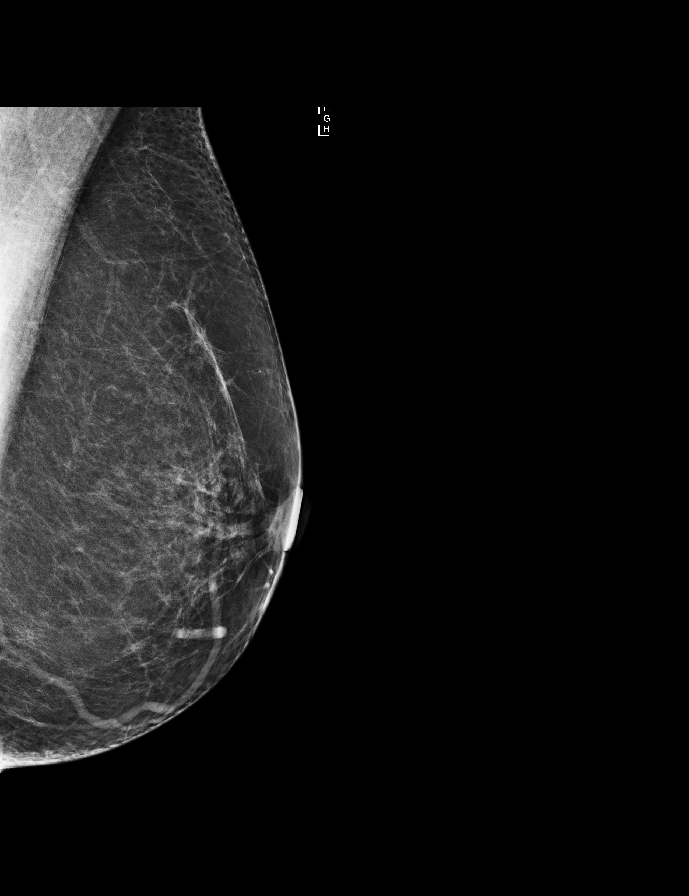

[R MLO synth-2D]
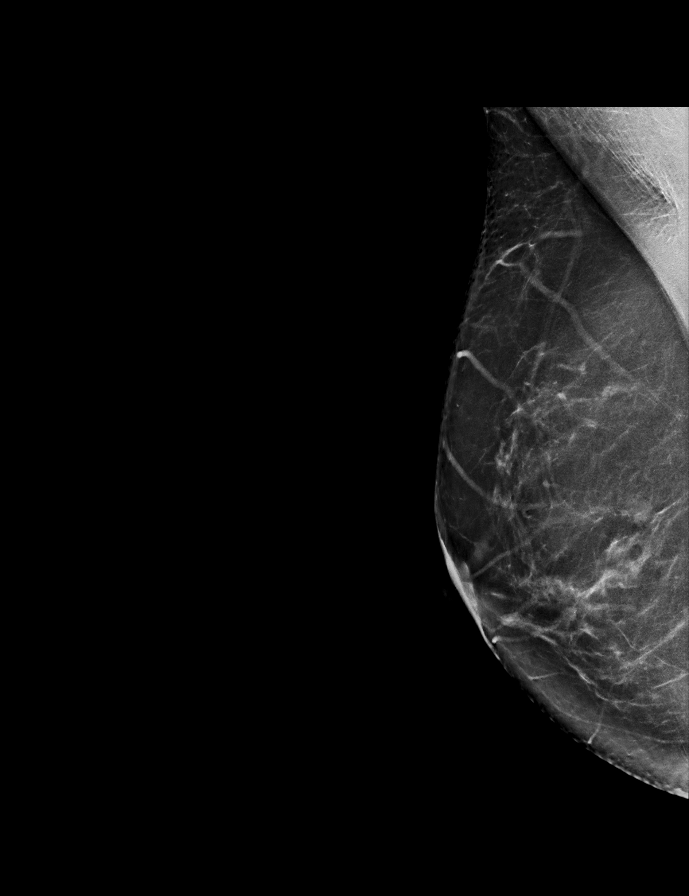

[R CC]
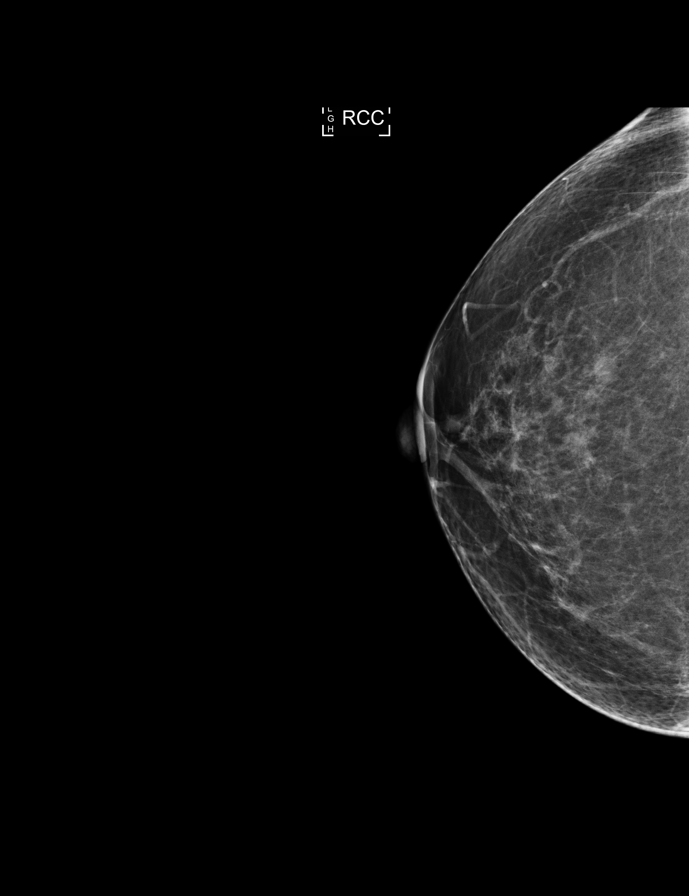

[L CC synth-2D]
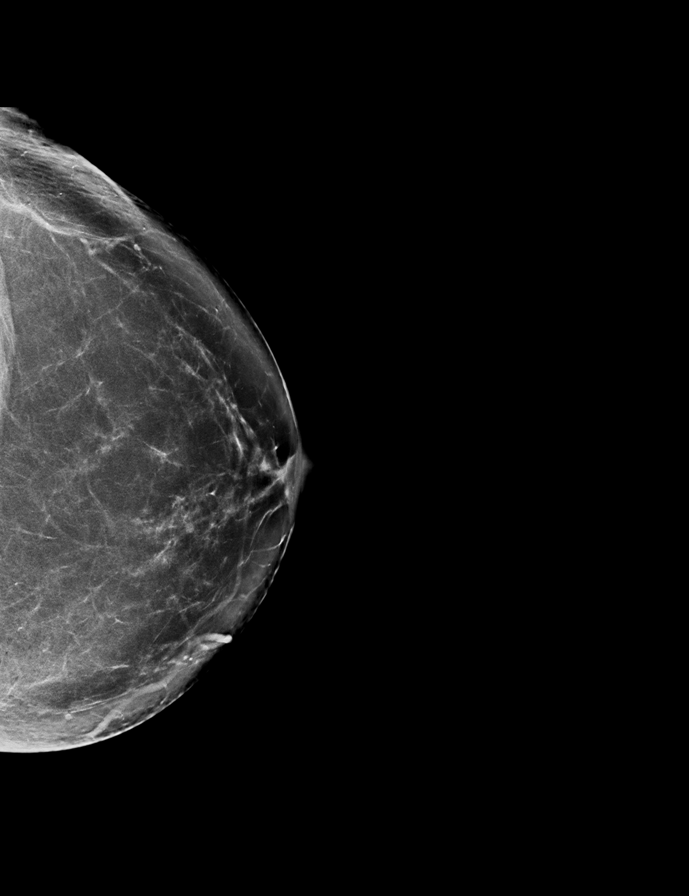

[L MLO synth-2D]
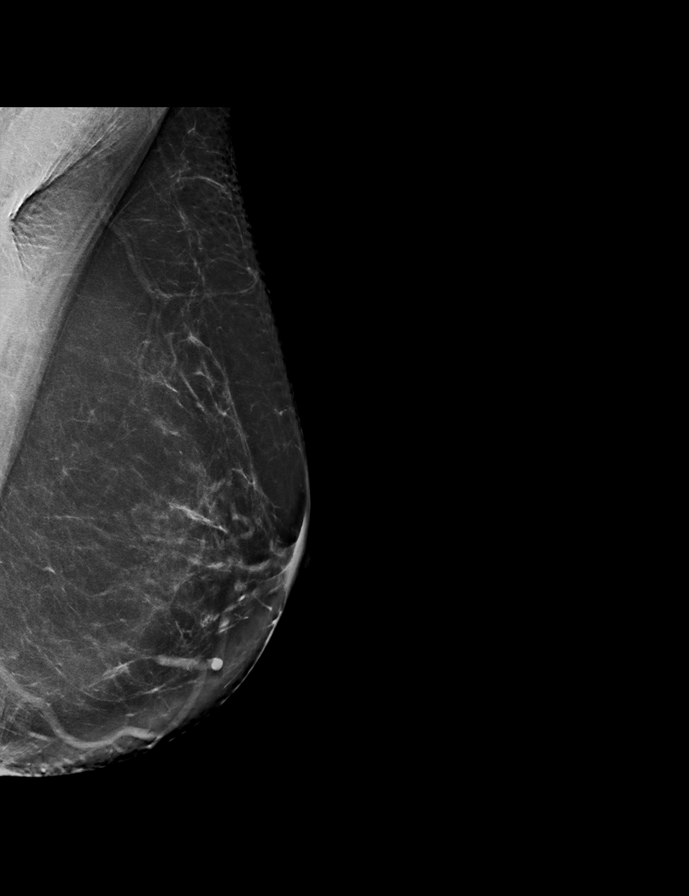

[R MLO]
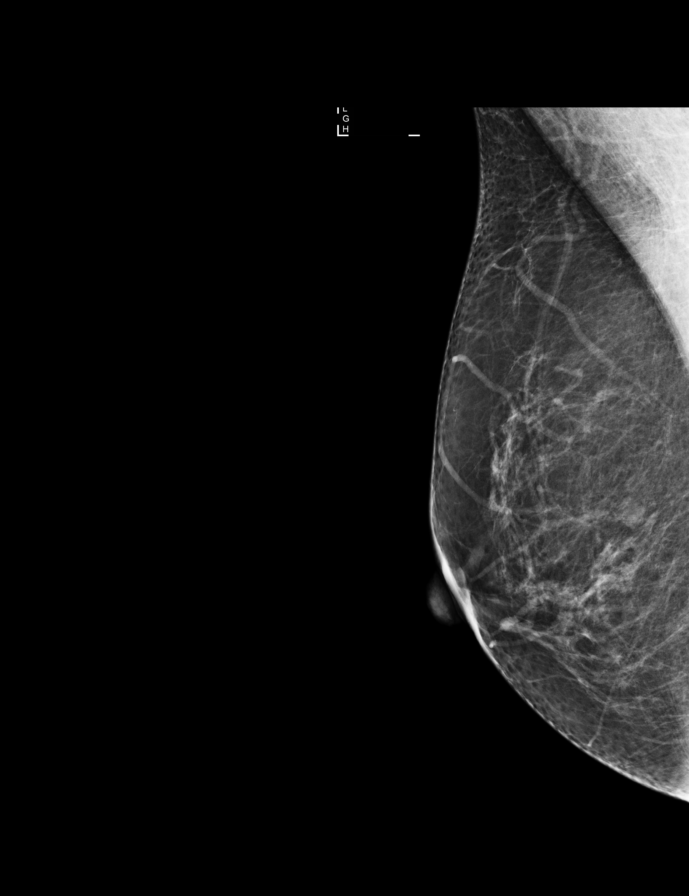

[L CC]
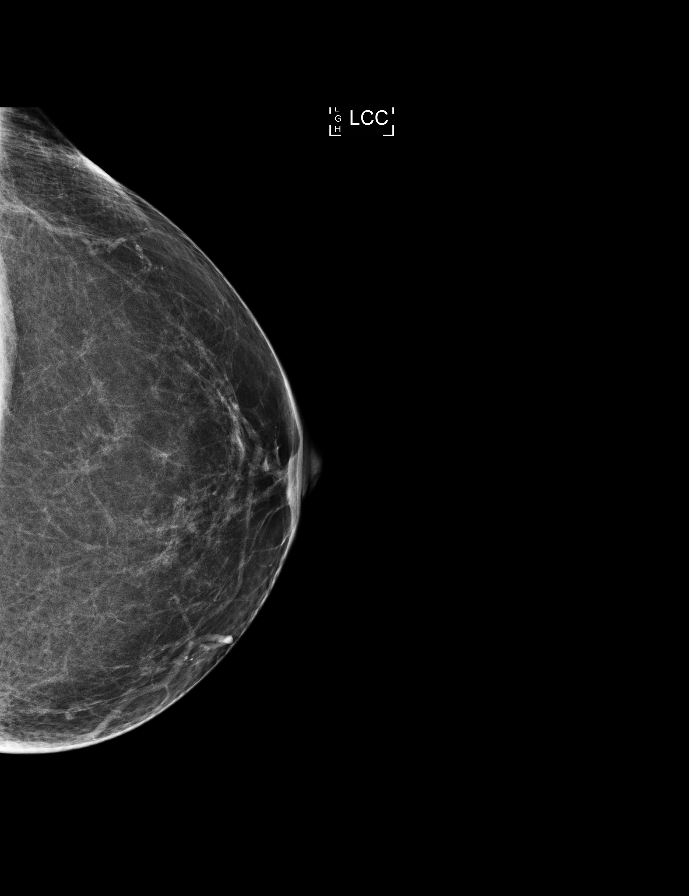

[R CC synth-2D]
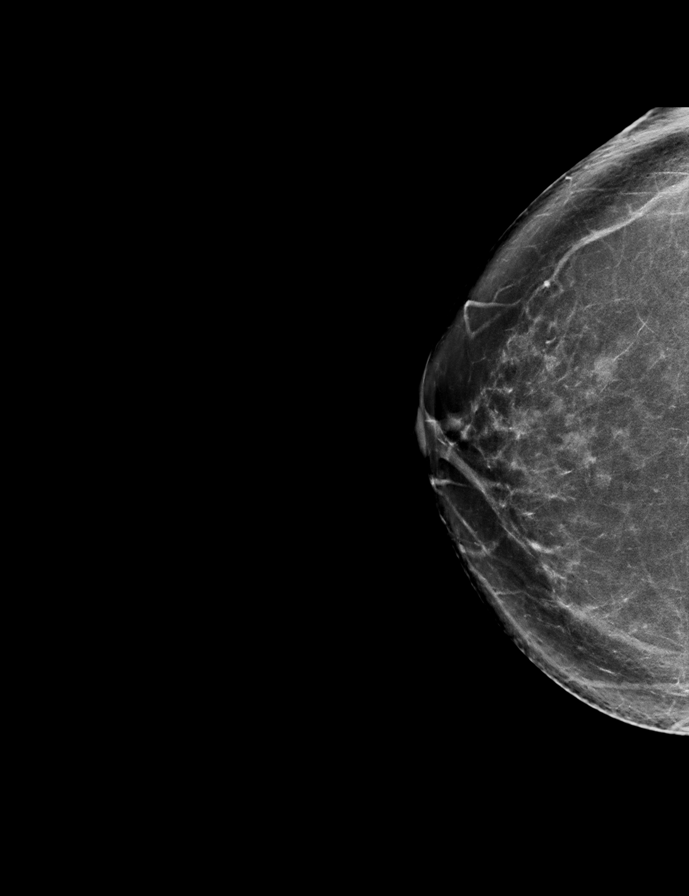

[L MLO (2 of 2)]
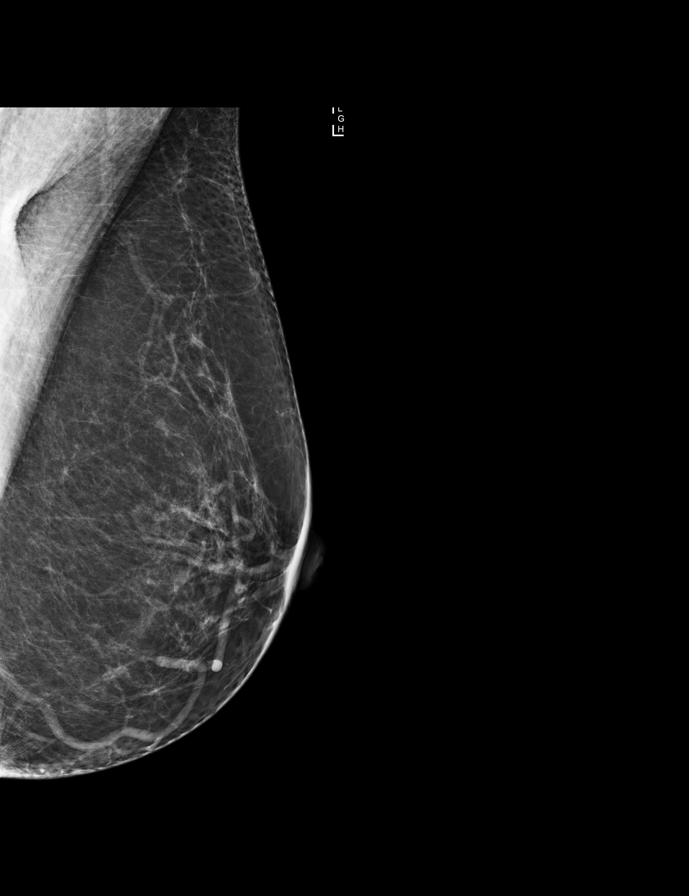

[9 of 29 positions shown; findings below may reference images not displayed]

ACR Breast Density Category b: There are scattered areas of
fibroglandular density.
FINDINGS: In the right breast, a possible mass warrants further evaluation. In
the left breast, no findings suspicious for malignancy. Images were
processed with CAD.
IMPRESSION: Further evaluation is suggested for possible mass in the right
breast.

RECOMMENDATION:
Ultrasound of the right breast. (Code:[00])

The patient will be contacted regarding the findings, and additional
imaging will be scheduled.

BI-RADS CATEGORY  0: Incomplete. Need additional imaging evaluation
and/or prior mammograms for comparison.

## 2016-04-29 ENCOUNTER — Other Ambulatory Visit: Payer: Self-pay | Admitting: Obstetrics & Gynecology

## 2016-04-29 DIAGNOSIS — R928 Other abnormal and inconclusive findings on diagnostic imaging of breast: Secondary | ICD-10-CM

## 2016-05-01 ENCOUNTER — Ambulatory Visit
Admission: RE | Admit: 2016-05-01 | Discharge: 2016-05-01 | Disposition: A | Discharge status: Home / Self Care | Payer: 59 | Source: Ambulatory Visit | Attending: Obstetrics & Gynecology | Admitting: Obstetrics & Gynecology

## 2016-05-01 ENCOUNTER — Other Ambulatory Visit: Payer: Self-pay | Admitting: Obstetrics & Gynecology

## 2016-05-01 DIAGNOSIS — R928 Other abnormal and inconclusive findings on diagnostic imaging of breast: Secondary | ICD-10-CM

## 2016-05-01 DIAGNOSIS — N6310 Unspecified lump in the right breast, unspecified quadrant: Secondary | ICD-10-CM

## 2016-05-01 DIAGNOSIS — N63 Unspecified lump in breast: Secondary | ICD-10-CM | POA: Diagnosis not present

## 2016-05-04 ENCOUNTER — Other Ambulatory Visit: Payer: Self-pay | Admitting: Obstetrics & Gynecology

## 2016-05-04 ENCOUNTER — Ambulatory Visit
Admission: RE | Admit: 2016-05-04 | Discharge: 2016-05-04 | Disposition: A | Discharge status: Home / Self Care | Payer: 59 | Source: Ambulatory Visit | Attending: Obstetrics & Gynecology | Admitting: Obstetrics & Gynecology

## 2016-05-04 DIAGNOSIS — N6001 Solitary cyst of right breast: Secondary | ICD-10-CM | POA: Diagnosis not present

## 2016-05-04 DIAGNOSIS — N6310 Unspecified lump in the right breast, unspecified quadrant: Secondary | ICD-10-CM

## 2016-05-04 IMAGING — US US ASPIRATION
1 series · 2 of 2 positions shown · non-contrast
Comparison: [DATE]

CLINICAL DATA: The patient presented today for an ultrasound-guided
biopsy of a 6 x 5 x 4 mm mass deep within the right breast at 8
o'clock position 1 cm from the nipple. In preparing for the biopsy
today, I visualized the mass with ultrasound. The majority of the
mass is anechoic, aside from a thin internal septation. There is a
well-defined posterior wall and definite posterior acoustic
enhancement. The cystic nature of the mass was discussed with the
patient, and I offered an attempt at cyst aspiration to confirm a
cyst. The patient was in agreement with proceeding with aspiration.

EXAM:
ULTRASOUND GUIDED RIGHT BREAST CYST ASPIRATION

[Series 1: us aspiration · 0.06mm/px · 2 of 2 slices shown]
[im 1/2]
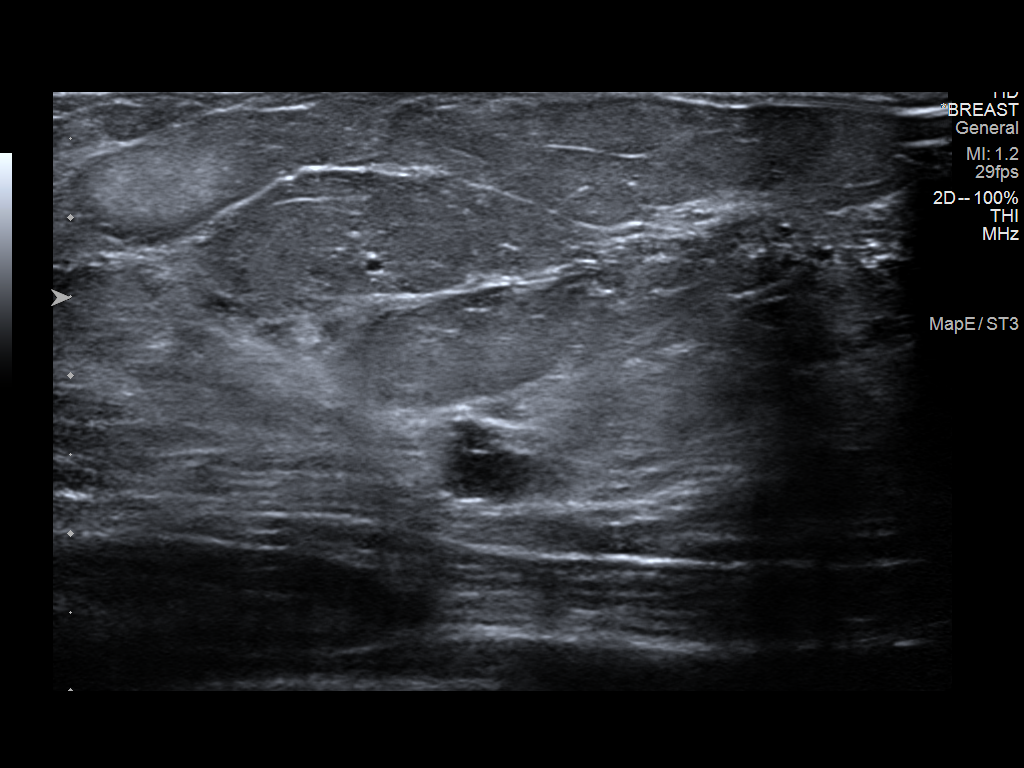
[im 2/2]
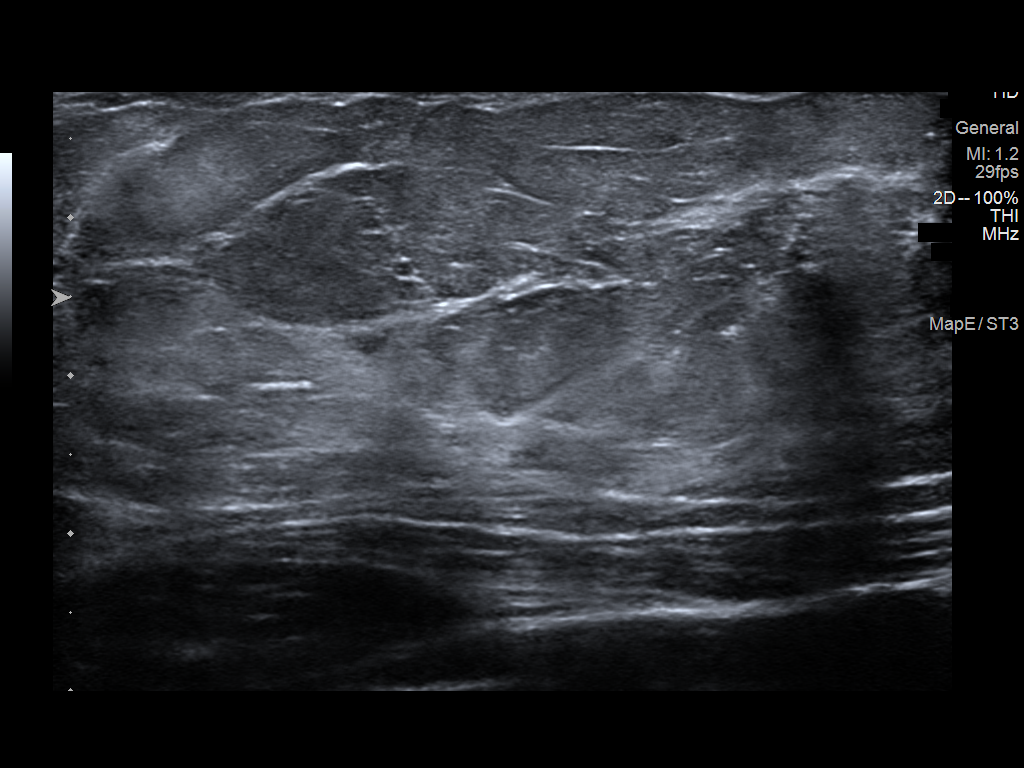

[2 of 2 positions shown; findings below may reference images not displayed]

PROCEDURE:
Using sterile technique, 1% lidocaine, under direct ultrasound
visualization, needle aspiration of an approximately 6 mm maximum
diameter oval nearly anechoic mass with posterior acoustic
enhancement and well-defined posterior wall at 8 o'clock position 1
cm from the nipple was performed. The cyst completely collapsed.
Yellowish fluid filled the hub of the needle. After the aspiration,
there are no suspicious findings. The fluid was discarded.
IMPRESSION: Ultrasound-guided aspiration of a cyst in the deep right breast 8
o'clock position 1 cm from the nipple No apparent complications.

RECOMMENDATIONS:
Bilateral screening mammogram is recommended in 1 year.

BI-RADS 2: Benign

## 2016-05-25 MED FILL — BUPROPION HCL XL 300 MG TAB: 300 | 90 days supply | Qty: 90 | Fill #2

## 2016-05-25 MED FILL — LEVOTHYROXINE 75 MCG TABLET: 75 | 90 days supply | Qty: 90 | Fill #1

## 2016-06-09 DIAGNOSIS — C44319 Basal cell carcinoma of skin of other parts of face: Secondary | ICD-10-CM | POA: Diagnosis not present

## 2016-06-09 DIAGNOSIS — L821 Other seborrheic keratosis: Secondary | ICD-10-CM | POA: Diagnosis not present

## 2016-06-09 DIAGNOSIS — D225 Melanocytic nevi of trunk: Secondary | ICD-10-CM | POA: Diagnosis not present

## 2016-06-09 DIAGNOSIS — D485 Neoplasm of uncertain behavior of skin: Secondary | ICD-10-CM | POA: Diagnosis not present

## 2016-06-15 MED FILL — ESTROGEN-METHYLTESTOSTERONE: 1.25-2.5 | 90 days supply | Qty: 90 | Fill #0

## 2016-06-15 MED FILL — ESTRADIOL 1 MG TABLET: 1 | 90 days supply | Qty: 90 | Fill #2

## 2016-07-01 ENCOUNTER — Ambulatory Visit (INDEPENDENT_AMBULATORY_CARE_PROVIDER_SITE_OTHER): Payer: 59 | Admitting: Family Medicine

## 2016-07-01 ENCOUNTER — Encounter: Payer: Self-pay | Admitting: Family Medicine

## 2016-07-01 VITALS — BP 140/80 | HR 74 | Temp 98.1°F | Wt 131.6 lb

## 2016-07-01 DIAGNOSIS — R05 Cough: Secondary | ICD-10-CM | POA: Diagnosis not present

## 2016-07-01 DIAGNOSIS — R059 Cough, unspecified: Secondary | ICD-10-CM

## 2016-07-01 MED ORDER — AZITHROMYCIN 250 MG PO TABS: ORAL_TABLET | ORAL | 0 refills | Status: DC

## 2016-07-01 MED FILL — AZITHROMYCIN 250 MG TABLET: 250 | 5 days supply | Qty: 6 | Fill #0

## 2016-07-01 NOTE — Progress Notes (Signed)
Subjective: Chief Complaint  Patient presents with  . cold    cold symptoms for the last 6 weeks. head cold, chest cold, coughing, ear pain     Dorothy Wood is a 55 y.o. female who presents for URI for past 6 weeks. States illness started with nasal congestion, rhinorrhea and then went away after a couple of week but then she states she got sick again with similar symptoms including a cough occasionally productive of green sputum for the past 4 weeks.  Symptoms are cough, left ear pain, throat pain with swallowing.  Denies fever, chills, chest pain, DOE, abdominal pain, N/V/D.   Treatment to date: mucinex.  Positive sick contacts.  No other aggravating or relieving factors.  No other c/o.  Denies history of bronchitis, pneumonia, asthma, COPD.  She is not a smoker.   ROS as in subjective.   Objective: Vitals:   07/01/16 1615  BP: 140/80  Pulse: 74  Temp: 98.1 F (36.7 C)    General appearance: Alert, WD/WN, no distress, is not ill appearing                             Skin: warm, no rash                           Head: no sinus tenderness                            Eyes: conjunctiva normal, corneas clear, PERRLA                            Ears: pearly TMs, external ear canals normal                          Nose: septum midline, turbinates swollen, with erythema and no discharge             Mouth/throat: MMM, tongue normal, mild pharyngeal erythema                           Neck: supple, no adenopathy, no thyromegaly, nontender                          Heart: RRR, normal S1, S2, no murmurs                         Lungs: CTA bilaterally, no wheezes, rales, or rhonchi      Assessment: Cough  Plan: Discussed diagnosis and treatment of URI and bronchitis. Zpak prescribed.  Suggested symptomatic OTC remedies. Nasal saline spray for congestion.  Tylenol or Ibuprofen OTC for fever and malaise.  Call/return in 10 days if not back to baseline or sooner if needed.

## 2016-07-14 ENCOUNTER — Encounter: Payer: Self-pay | Admitting: Family Medicine

## 2016-07-14 MED ORDER — AZITHROMYCIN 250 MG PO TABS: ORAL_TABLET | ORAL | 0 refills | Status: DC

## 2016-07-14 MED ORDER — METHYLPHENIDATE HCL 20 MG PO TABS: 20.0000 mg | ORAL_TABLET | Freq: Three times a day (TID) | ORAL | 0 refills | Status: DC

## 2016-07-14 MED FILL — AZITHROMYCIN 250 MG TABLET: 250 | 5 days supply | Qty: 6 | Fill #0

## 2016-07-15 MED FILL — METHYLPHENIDATE 20 MG TAB: 20 | 90 days supply | Qty: 270 | Fill #0

## 2016-07-22 ENCOUNTER — Encounter: Payer: Self-pay | Admitting: Family Medicine

## 2016-07-22 ENCOUNTER — Ambulatory Visit (INDEPENDENT_AMBULATORY_CARE_PROVIDER_SITE_OTHER): Payer: 59 | Admitting: Family Medicine

## 2016-07-22 VITALS — BP 120/80 | HR 80 | Wt 136.0 lb

## 2016-07-22 DIAGNOSIS — S39012A Strain of muscle, fascia and tendon of lower back, initial encounter: Secondary | ICD-10-CM | POA: Diagnosis not present

## 2016-07-22 MED ORDER — CYCLOBENZAPRINE HCL 10 MG PO TABS: 10.0000 mg | ORAL_TABLET | Freq: Three times a day (TID) | ORAL | 0 refills | Status: DC | PRN

## 2016-07-22 MED FILL — CYCLOBENZAPRINE 10 MG TAB: 10 | 5 days supply | Qty: 15 | Fill #0

## 2016-07-22 NOTE — Progress Notes (Signed)
   Subjective:    Patient ID:  Cellar, female    DOB: 1961-04-01, 55 y.o.   MRN: [id number]  HPI She flew back yesterday from [facility] and sat in an uncomfortable seat. She experienced some low back pain but no radiation. She has been using heat as well as to ibuprofen several times per day. She is not schedule back to work for several more days and will like to stay on top of this.   Review of Systems     Objective:   Physical Exam Alert and appearing a slight discomfort. Splinting noted when getting up from the chair. Pain on motion of her back with fairly good lumbar motion. Normal hip motion. Negative straight leg raising and normal DTRs.       Assessment & Plan:  Back strain, initial encounter - Plan: cyclobenzaprine (FLEXERIL) 10 MG tablet Recommend proper posturing, heat for 20 minutes 3 times per day, 800 mg ibuprofen 3 times a day. I will also give Flexeril to be used mainly at night. Will give her a note for light duty for one week.

## 2016-07-22 NOTE — Patient Instructions (Signed)
Heat for 20 minutes 3 times per day. Flexeril mainly at night. Proper posturing. Lift with your knees not back. 800 mg 3 times per day of ibuprofen

## 2016-08-05 DIAGNOSIS — L821 Other seborrheic keratosis: Secondary | ICD-10-CM | POA: Diagnosis not present

## 2016-08-05 DIAGNOSIS — Z85828 Personal history of other malignant neoplasm of skin: Secondary | ICD-10-CM | POA: Diagnosis not present

## 2016-08-05 DIAGNOSIS — D1801 Hemangioma of skin and subcutaneous tissue: Secondary | ICD-10-CM | POA: Diagnosis not present

## 2016-08-05 DIAGNOSIS — L814 Other melanin hyperpigmentation: Secondary | ICD-10-CM | POA: Diagnosis not present

## 2016-08-05 DIAGNOSIS — D225 Melanocytic nevi of trunk: Secondary | ICD-10-CM | POA: Diagnosis not present

## 2016-08-13 DIAGNOSIS — C44319 Basal cell carcinoma of skin of other parts of face: Secondary | ICD-10-CM | POA: Diagnosis not present

## 2016-09-01 DIAGNOSIS — H524 Presbyopia: Secondary | ICD-10-CM | POA: Diagnosis not present

## 2016-09-01 MED FILL — LEVOTHYROXINE 75 MCG TABLET: 75 | 90 days supply | Qty: 90 | Fill #2

## 2016-09-08 MED FILL — BUPROPION HCL XL 300 MG TAB: 300 | 90 days supply | Qty: 90 | Fill #3

## 2016-09-08 MED FILL — ESTROGEN-METHYLTESTOSTERONE: 1.25-2.5 | 90 days supply | Qty: 90 | Fill #1

## 2016-09-09 ENCOUNTER — Ambulatory Visit (INDEPENDENT_AMBULATORY_CARE_PROVIDER_SITE_OTHER): Payer: 59 | Admitting: Family Medicine

## 2016-09-09 VITALS — Ht 65.0 in | Wt 136.0 lb

## 2016-09-09 DIAGNOSIS — E039 Hypothyroidism, unspecified: Secondary | ICD-10-CM

## 2016-09-09 DIAGNOSIS — Z1159 Encounter for screening for other viral diseases: Secondary | ICD-10-CM

## 2016-09-09 DIAGNOSIS — F909 Attention-deficit hyperactivity disorder, unspecified type: Secondary | ICD-10-CM | POA: Diagnosis not present

## 2016-09-09 DIAGNOSIS — F32A Depression, unspecified: Secondary | ICD-10-CM

## 2016-09-09 DIAGNOSIS — F329 Major depressive disorder, single episode, unspecified: Secondary | ICD-10-CM

## 2016-09-09 DIAGNOSIS — Z79890 Hormone replacement therapy (postmenopausal): Secondary | ICD-10-CM | POA: Diagnosis not present

## 2016-09-09 DIAGNOSIS — Z79899 Other long term (current) drug therapy: Secondary | ICD-10-CM

## 2016-09-09 LAB — COMPREHENSIVE METABOLIC PANEL
ALK PHOS: 54 U/L (ref 33–130)
ALT: 29 U/L (ref 6–29)
AST: 30 U/L (ref 10–35)
Albumin: 4.1 g/dL (ref 3.6–5.1)
BUN: 9 mg/dL (ref 7–25)
CALCIUM: 9.6 mg/dL (ref 8.6–10.4)
CO2: 26 mmol/L (ref 20–31)
Chloride: 99 mmol/L (ref 98–110)
Creat: 0.81 mg/dL (ref 0.50–1.05)
Glucose, Bld: 112 mg/dL — ABNORMAL HIGH (ref 65–99)
POTASSIUM: 4.4 mmol/L (ref 3.5–5.3)
Sodium: 138 mmol/L (ref 135–146)
TOTAL PROTEIN: 6.6 g/dL (ref 6.1–8.1)
Total Bilirubin: 0.4 mg/dL (ref 0.2–1.2)

## 2016-09-09 LAB — CBC WITH DIFFERENTIAL/PLATELET
BASOS ABS: 0 {cells}/uL (ref 0–200)
Basophils Relative: 0 %
EOS ABS: 64 {cells}/uL (ref 15–500)
Eosinophils Relative: 1 %
HEMATOCRIT: 40.5 % (ref 35.0–45.0)
Hemoglobin: 13.3 g/dL (ref 11.7–15.5)
LYMPHS PCT: 34 %
Lymphs Abs: 2176 cells/uL (ref 850–3900)
MCH: 30 pg (ref 27.0–33.0)
MCHC: 32.8 g/dL (ref 32.0–36.0)
MCV: 91.2 fL (ref 80.0–100.0)
MONO ABS: 384 {cells}/uL (ref 200–950)
MPV: 10.1 fL (ref 7.5–12.5)
Monocytes Relative: 6 %
Neutro Abs: 3776 cells/uL (ref 1500–7800)
Neutrophils Relative %: 59 %
Platelets: 253 10*3/uL (ref 140–400)
RBC: 4.44 MIL/uL (ref 3.80–5.10)
RDW: 13.2 % (ref 11.0–15.0)
WBC: 6.4 10*3/uL (ref 4.0–10.5)

## 2016-09-09 LAB — LIPID PANEL
CHOLESTEROL: 251 mg/dL — AB (ref <200)
HDL: 57 mg/dL (ref >50)
LDL Cholesterol: 151 mg/dL — ABNORMAL HIGH (ref <100)
TRIGLYCERIDES: 215 mg/dL — AB (ref <150)
Total CHOL/HDL Ratio: 4.4 Ratio (ref <5.0)
VLDL: 43 mg/dL — ABNORMAL HIGH (ref <30)

## 2016-09-09 LAB — TSH: TSH: 2.22 mIU/L

## 2016-09-09 LAB — HEPATITIS C ANTIBODY: HCV Ab: NEGATIVE

## 2016-09-09 NOTE — Progress Notes (Signed)
   Subjective:    Patient ID: Dorothy Wood, female    DOB: 31-Oct-1960, 55 y.o.   MRN: [id number]  HPI She is here for an interval evaluation. She does have an underlying history of depression and has been stable on Wellbutrin for over 15 years. She is not involved in therapy and psychologically is doing quite well. He also has ADD and does quite nicely taking Ritalin 3 times per day. The medicine the lasts roughly 4 hours and she has no symptoms while on it or withdrawal problems. She is taking Synthroid 75 g. She was placed on this by her OB/GYN. She is also on hormone replacement therapy and doing quite nicely on that. She has no intentions of stopping the medication. She has discussed this with her gynecologist who apparently is also in agreement. Her home life and work are going quite well.   Review of Systems     Objective:   Physical Exam Alert and in no distress. Tympanic membranes and canals are normal. Pharyngeal area is normal. Neck is supple without adenopathy or thyromegaly. Cardiac exam shows a regular sinus rhythm without murmurs or gallops. Lungs are clear to auscultation.        Assessment & Plan:  Depression, unspecified depression type - Plan: CBC with Differential/Platelet, Comprehensive metabolic panel, TSH  Hypothyroidism, unspecified type - Plan: CBC with Differential/Platelet, Comprehensive metabolic panel, TSH  Attention deficit hyperactivity disorder (ADHD), unspecified ADHD type  Hormone replacement therapy (HRT) - Plan: CBC with Differential/Platelet, Comprehensive metabolic panel  Need for hepatitis C screening test - Plan: Hepatitis C antibody  Encounter for long-term (current) use of medications - Plan: CBC with Differential/Platelet, Comprehensive metabolic panel, Lipid panel I will continue her on her present medication regimen. Insurance paperwork was filled out for her.

## 2016-09-15 MED FILL — PILOCARPINE HCL 5 MG TABLET: 5 | 90 days supply | Qty: 360 | Fill #1

## 2016-09-15 MED FILL — ESTRADIOL 1 MG TABLET: 1 | 90 days supply | Qty: 90 | Fill #3

## 2016-10-06 MED FILL — AMOXICILLIN 500 MG CAPSULE: 500 | 3 days supply | Qty: 12 | Fill #0

## 2016-11-25 ENCOUNTER — Telehealth: Payer: Self-pay | Admitting: Family Medicine

## 2016-11-25 NOTE — Telephone Encounter (Signed)
Needs refill on Ritalin (reminder it was discussed at her last ov)  Please call when ready

## 2016-11-26 MED ORDER — METHYLPHENIDATE HCL 20 MG PO TABS: 20.0000 mg | ORAL_TABLET | Freq: Three times a day (TID) | ORAL | 0 refills | Status: DC

## 2016-11-27 ENCOUNTER — Telehealth: Payer: Self-pay

## 2016-11-27 NOTE — Telephone Encounter (Signed)
LM that script for Ritalin ready for pick up. Victorino December

## 2016-12-08 ENCOUNTER — Other Ambulatory Visit: Payer: Self-pay | Admitting: Family Medicine

## 2016-12-08 MED FILL — BUPROPION HCL XL 300 MG TAB: 300 | 90 days supply | Qty: 90 | Fill #0

## 2016-12-08 MED FILL — ESTROGEN-METHYLTESTOSTERONE: 1.25-2.5 | 90 days supply | Qty: 90 | Fill #2

## 2016-12-08 MED FILL — LEVOTHYROXINE 75 MCG TABLET: 75 | 90 days supply | Qty: 90 | Fill #3

## 2016-12-08 NOTE — Telephone Encounter (Signed)
Is this okay to refill? 

## 2016-12-17 MED FILL — METHYLPHENIDATE 20 MG TAB: 20 | 90 days supply | Qty: 270 | Fill #0

## 2016-12-22 MED FILL — ESTRADIOL 1 MG TABLET: 1 | 90 days supply | Qty: 90 | Fill #0

## 2017-03-04 MED FILL — BUPROPION HCL XL 300 MG TAB: 300 | 90 days supply | Qty: 90 | Fill #1

## 2017-03-05 MED FILL — PILOCARPINE HCL 5 MG TABLET: 5 | 90 days supply | Qty: 360 | Fill #2

## 2017-03-05 MED FILL — LEVOTHYROXINE 75 MCG TABLET: 75 | 90 days supply | Qty: 90 | Fill #0

## 2017-03-25 MED FILL — ESTRADIOL 1 MG TABLET: 1 | 90 days supply | Qty: 90 | Fill #1

## 2017-03-25 MED FILL — ESTROGEN-METHYLTESTOSTERONE: 1.25-2.5 | 90 days supply | Qty: 90 | Fill #0

## 2017-05-11 DIAGNOSIS — E039 Hypothyroidism, unspecified: Secondary | ICD-10-CM | POA: Diagnosis not present

## 2017-05-11 DIAGNOSIS — Z0000 Encounter for general adult medical examination without abnormal findings: Secondary | ICD-10-CM | POA: Diagnosis not present

## 2017-05-11 DIAGNOSIS — Z01419 Encounter for gynecological examination (general) (routine) without abnormal findings: Secondary | ICD-10-CM | POA: Diagnosis not present

## 2017-05-11 MED FILL — ALPRAZolam 0.25 MG TABS: 0.25 | 25 days supply | Qty: 25 | Fill #0

## 2017-05-12 MED FILL — LEVOTHYROXINE 88 MCG TABLET: 88 | 90 days supply | Qty: 90 | Fill #0

## 2017-06-01 ENCOUNTER — Telehealth: Payer: Self-pay | Admitting: Family Medicine

## 2017-06-01 ENCOUNTER — Encounter: Payer: Self-pay | Admitting: Family Medicine

## 2017-06-01 MED ORDER — METHYLPHENIDATE HCL 20 MG PO TABS: 20.0000 mg | ORAL_TABLET | Freq: Three times a day (TID) | ORAL | 0 refills | Status: DC

## 2017-06-01 MED FILL — METHYLPHENIDATE 20 MG TAB: 20 | 90 days supply | Qty: 270 | Fill #0

## 2017-06-01 NOTE — Telephone Encounter (Deleted)
dt ?

## 2017-06-01 NOTE — Telephone Encounter (Signed)
Left message informing her rx is ready for pick up

## 2017-06-01 NOTE — Telephone Encounter (Signed)
Pt called and said you saw her early for her Ritalin appt.  She needs refill for Ritalin 20 mg  Tid  Three month supply

## 2017-06-09 MED FILL — AMOXICILLIN 500 MG CAPSULE: 500 | 2 days supply | Qty: 8 | Fill #0

## 2017-06-15 MED FILL — BUPROPION HCL XL 300 MG TAB: 300 | 90 days supply | Qty: 90 | Fill #2

## 2017-07-01 MED FILL — ESTRADIOL 1 MG TAB: 1 | 90 days supply | Qty: 90 | Fill #2

## 2017-07-01 MED FILL — ESTROGEN-METHYLTESTOSTERONE: 1.25-2.5 | 90 days supply | Qty: 90 | Fill #1

## 2017-07-08 ENCOUNTER — Other Ambulatory Visit: Payer: Self-pay | Admitting: Obstetrics & Gynecology

## 2017-07-08 DIAGNOSIS — Z1231 Encounter for screening mammogram for malignant neoplasm of breast: Secondary | ICD-10-CM

## 2017-07-28 ENCOUNTER — Ambulatory Visit
Admission: RE | Admit: 2017-07-28 | Discharge: 2017-07-28 | Disposition: A | Discharge status: Home / Self Care | Payer: 59 | Source: Ambulatory Visit | Attending: Obstetrics & Gynecology | Admitting: Obstetrics & Gynecology

## 2017-07-28 DIAGNOSIS — Z1231 Encounter for screening mammogram for malignant neoplasm of breast: Secondary | ICD-10-CM

## 2017-07-28 IMAGING — MG 2D DIGITAL SCREENING BILATERAL MAMMOGRAM WITH CAD AND ADJUNCT TO
9 of 12 series · 9 of 28 positions shown · non-contrast
Comparison: Previous exam(s).

CLINICAL DATA: Screening.

EXAM:
2D DIGITAL SCREENING BILATERAL MAMMOGRAM WITH CAD AND ADJUNCT TOMO

[L CC synth-2D]
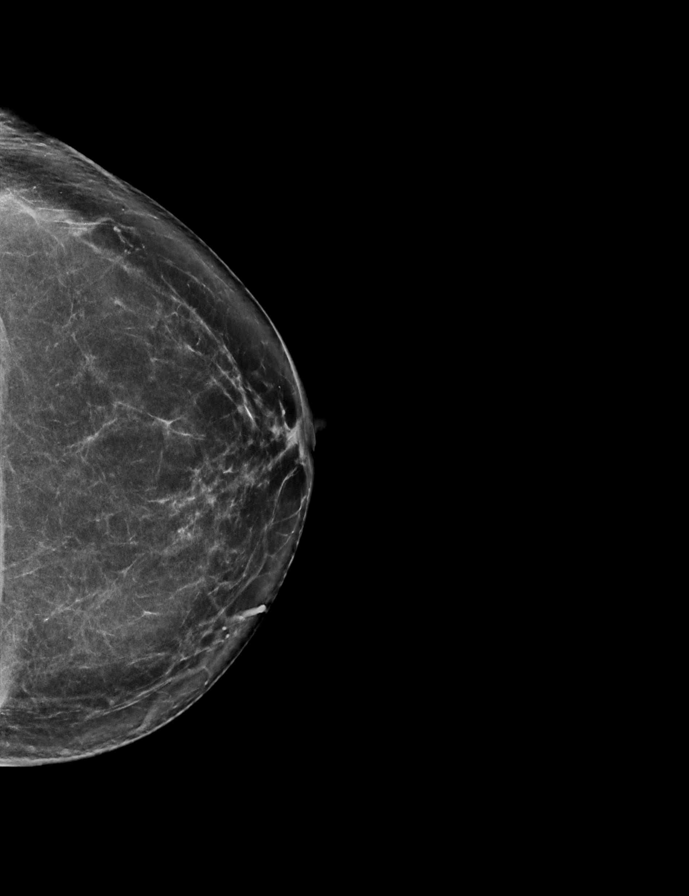

[L CC]
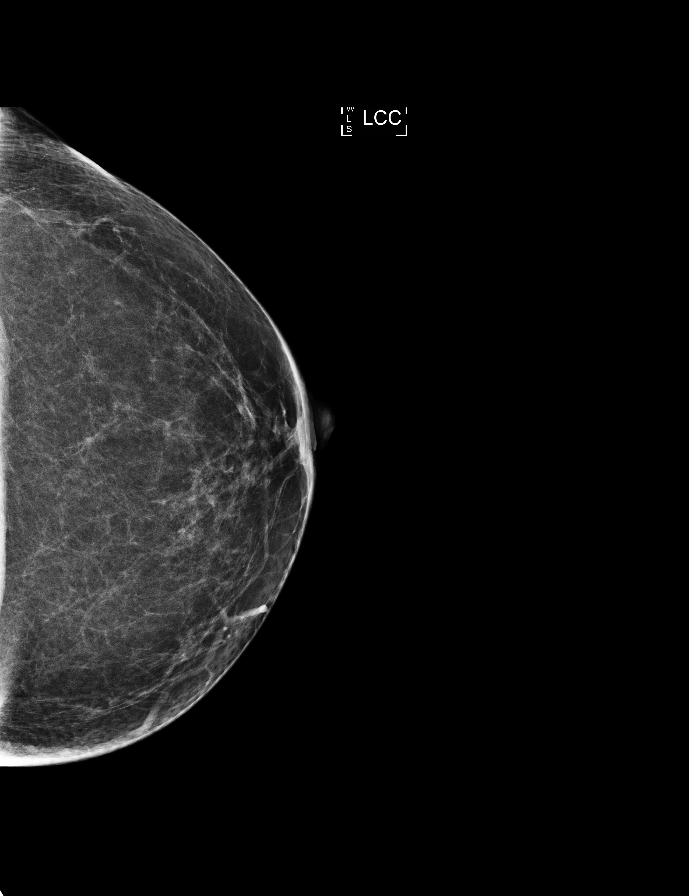

[R MLO]
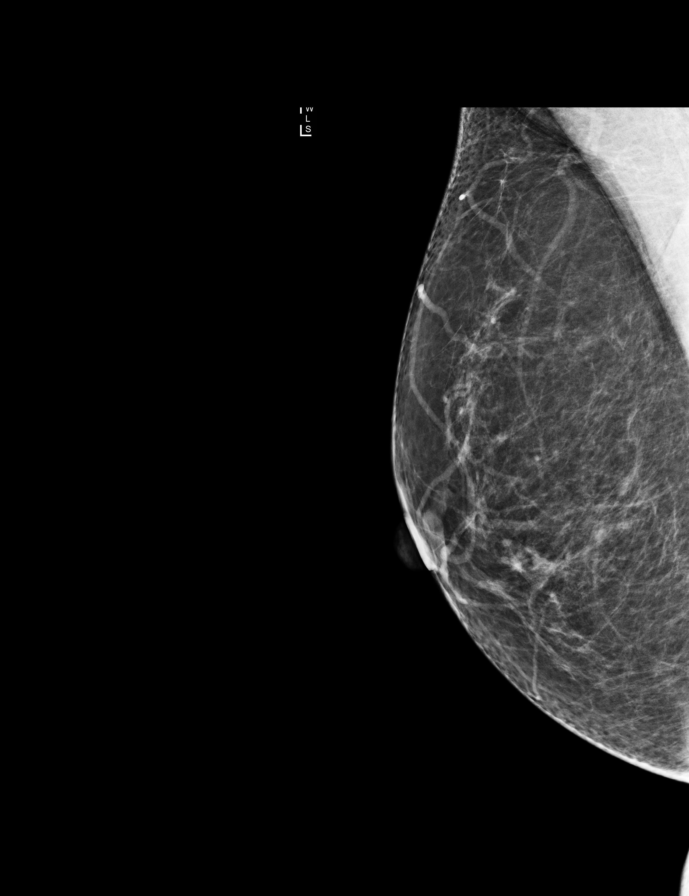

[R CC]
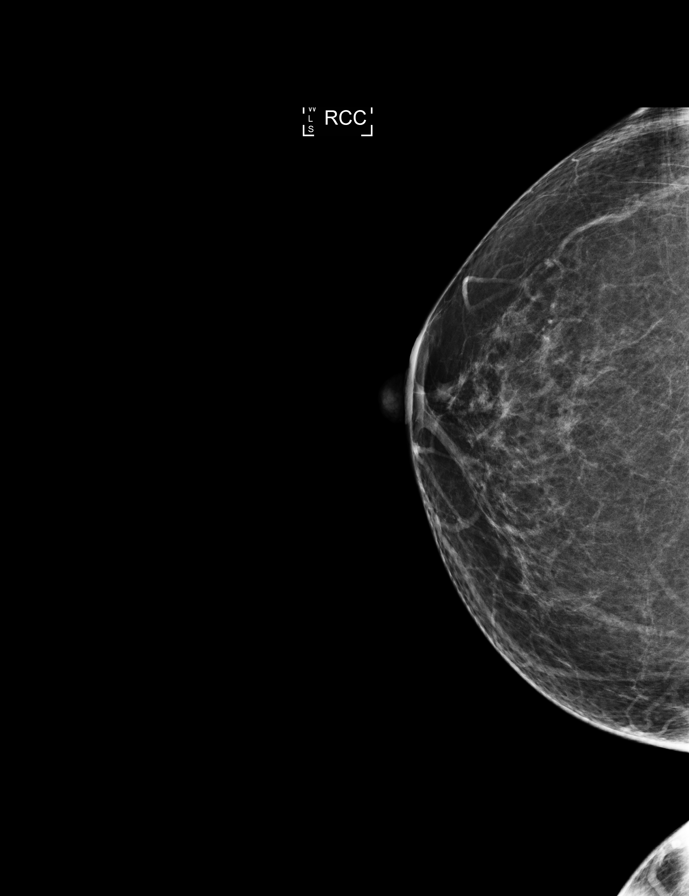

[R CC synth-2D]
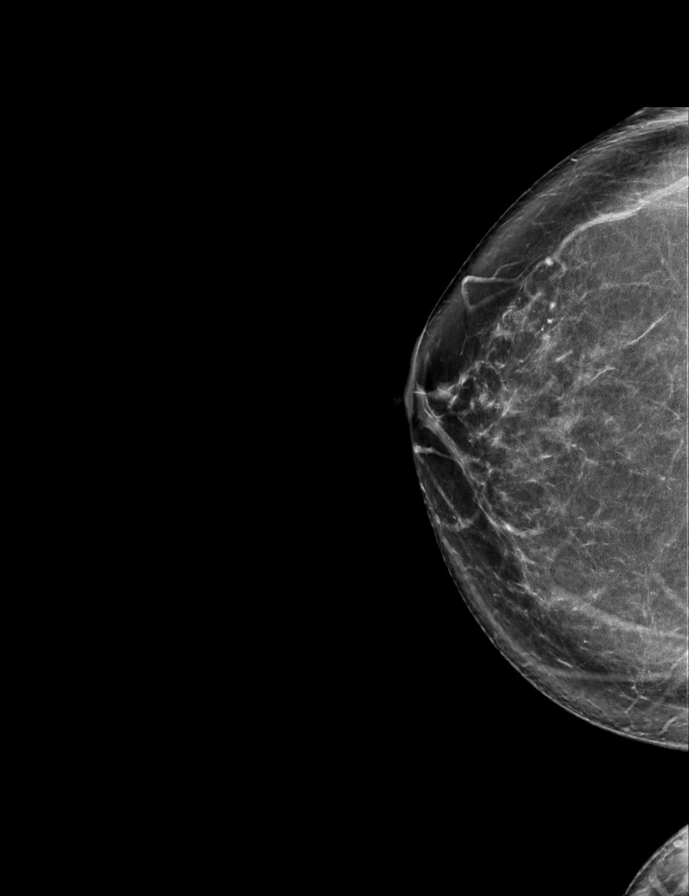

[R MLO synth-2D]
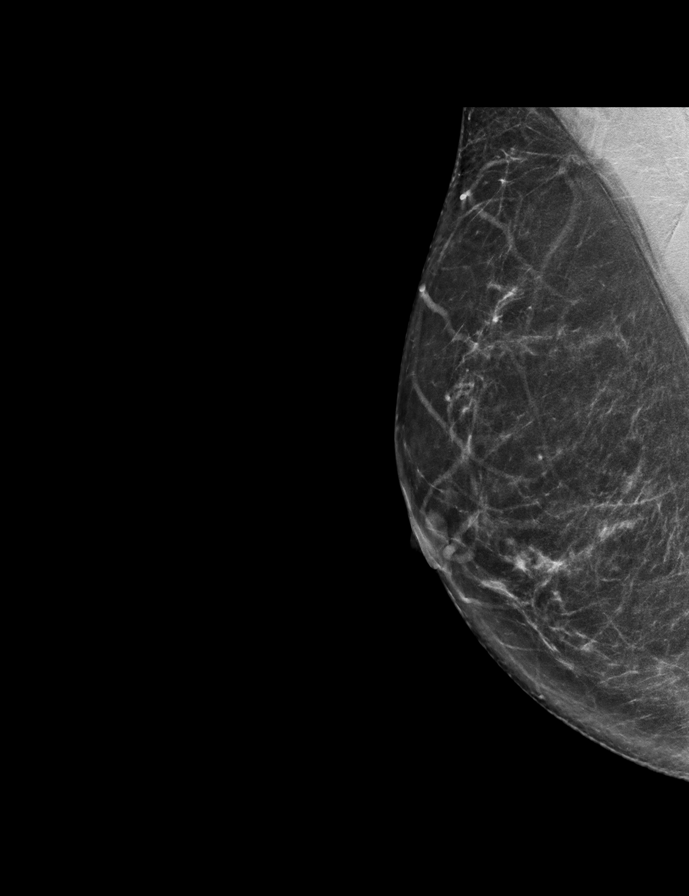

[L MLO synth-2D]
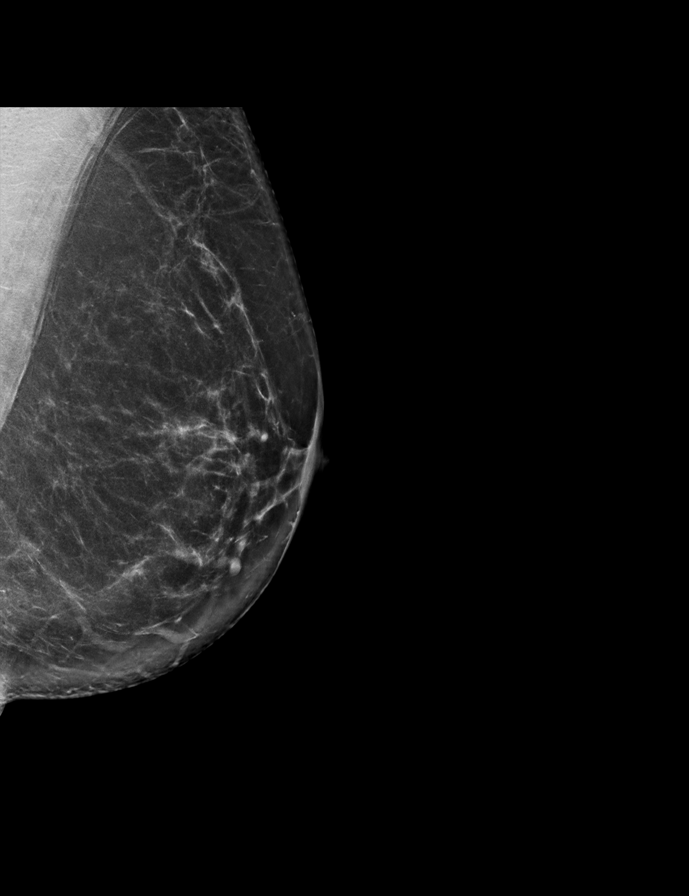

[L MLO]
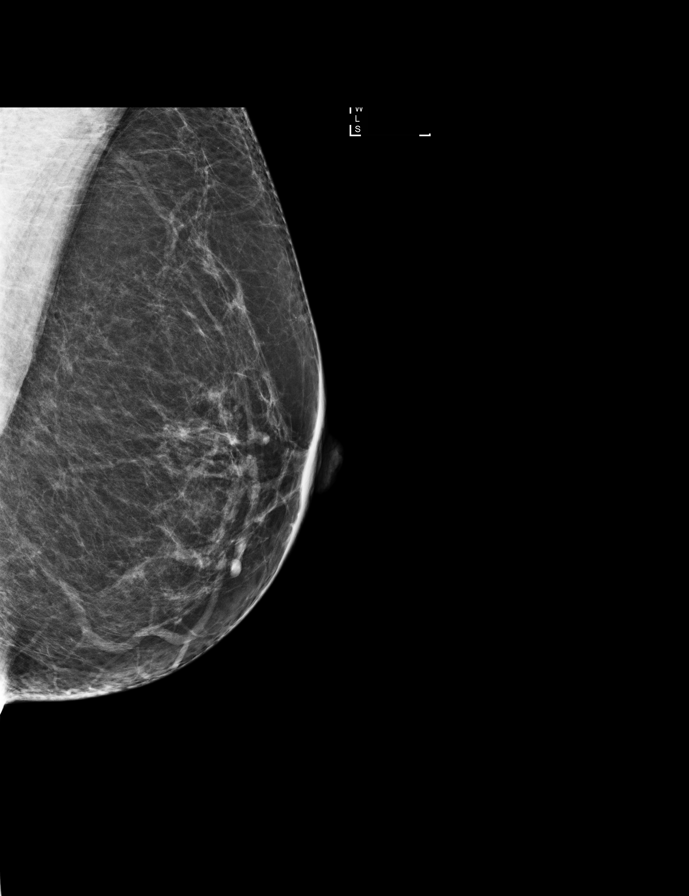

[R CC tomo · tomo slice 40/79.0]
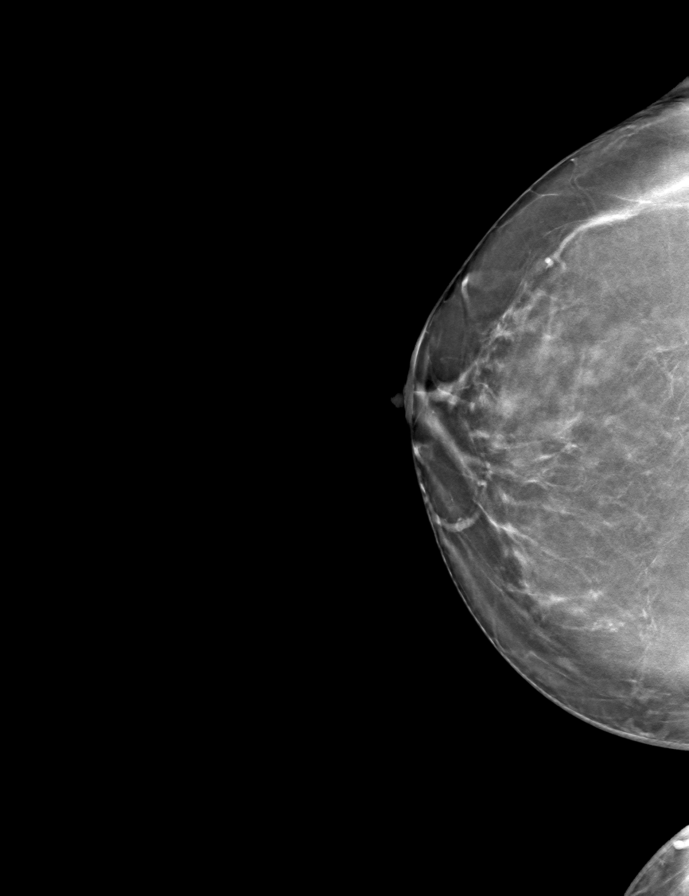

[9 of 28 positions shown; findings below may reference images not displayed]

ACR Breast Density Category b: There are scattered areas of
fibroglandular density.
FINDINGS: In the right breast, a possible mass warrants further evaluation. In
the left breast, no findings suspicious for malignancy. Images were
processed with CAD.
IMPRESSION: Further evaluation is suggested for possible mass in the right
breast.

RECOMMENDATION:
Diagnostic mammogram and possibly ultrasound of the right breast.
(Code:[J6])

The patient will be contacted regarding the findings, and additional
imaging will be scheduled.

BI-RADS CATEGORY  0: Incomplete. Need additional imaging evaluation
and/or prior mammograms for comparison.

## 2017-07-29 ENCOUNTER — Other Ambulatory Visit: Payer: Self-pay | Admitting: Obstetrics & Gynecology

## 2017-07-29 DIAGNOSIS — R928 Other abnormal and inconclusive findings on diagnostic imaging of breast: Secondary | ICD-10-CM

## 2017-08-11 ENCOUNTER — Ambulatory Visit
Admission: RE | Admit: 2017-08-11 | Discharge: 2017-08-11 | Disposition: A | Discharge status: Home / Self Care | Payer: 59 | Source: Ambulatory Visit | Attending: Obstetrics & Gynecology | Admitting: Obstetrics & Gynecology

## 2017-08-11 DIAGNOSIS — R928 Other abnormal and inconclusive findings on diagnostic imaging of breast: Secondary | ICD-10-CM | POA: Diagnosis not present

## 2017-08-11 DIAGNOSIS — N6489 Other specified disorders of breast: Secondary | ICD-10-CM | POA: Diagnosis not present

## 2017-08-11 IMAGING — MG 2D DIGITAL DIAGNOSTIC UNILATERAL RIGHT MAMMOGRAM WITH CAD AND AD
6 series · 6 of 14 positions shown · non-contrast
Comparison: Previous exam(s).

CLINICAL DATA: 56-year-old female recalled from screening mammogram
dated [DATE] for a possible right subareolar mass.

EXAM:
2D DIGITAL DIAGNOSTIC RIGHT MAMMOGRAM WITH CAD AND ADJUNCT TOMO
ULTRASOUND RIGHT BREAST

[R CC synth-2D]
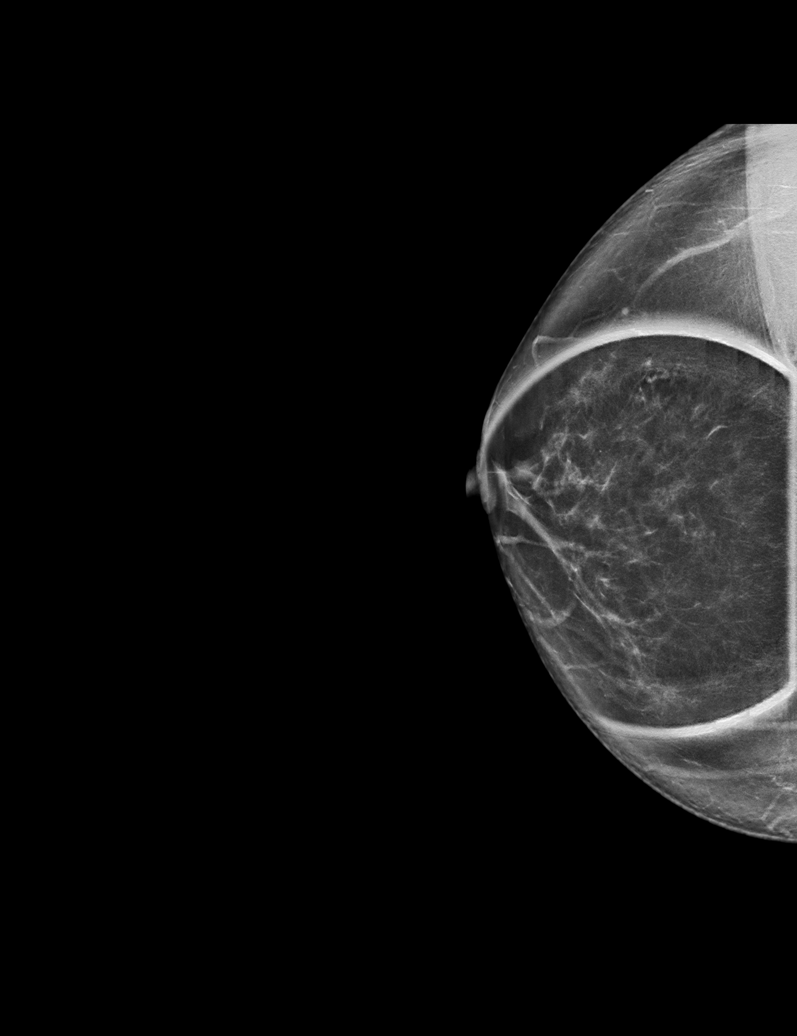

[R MLO synth-2D]
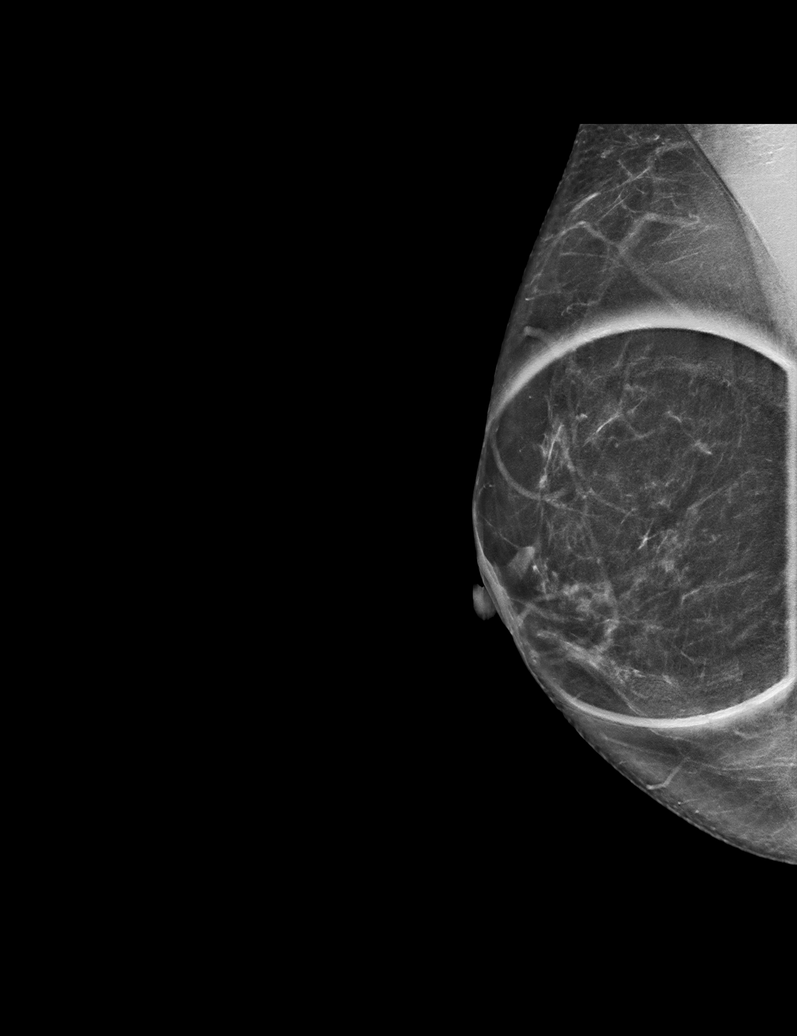

[R CC]
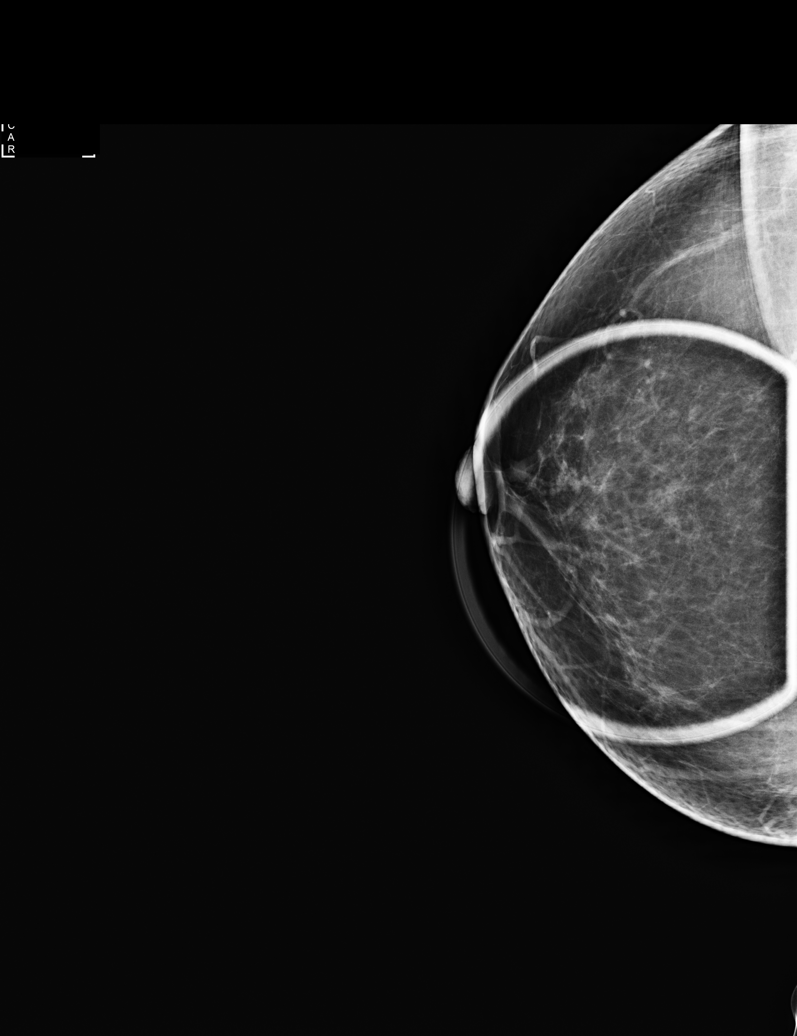

[R MLO]
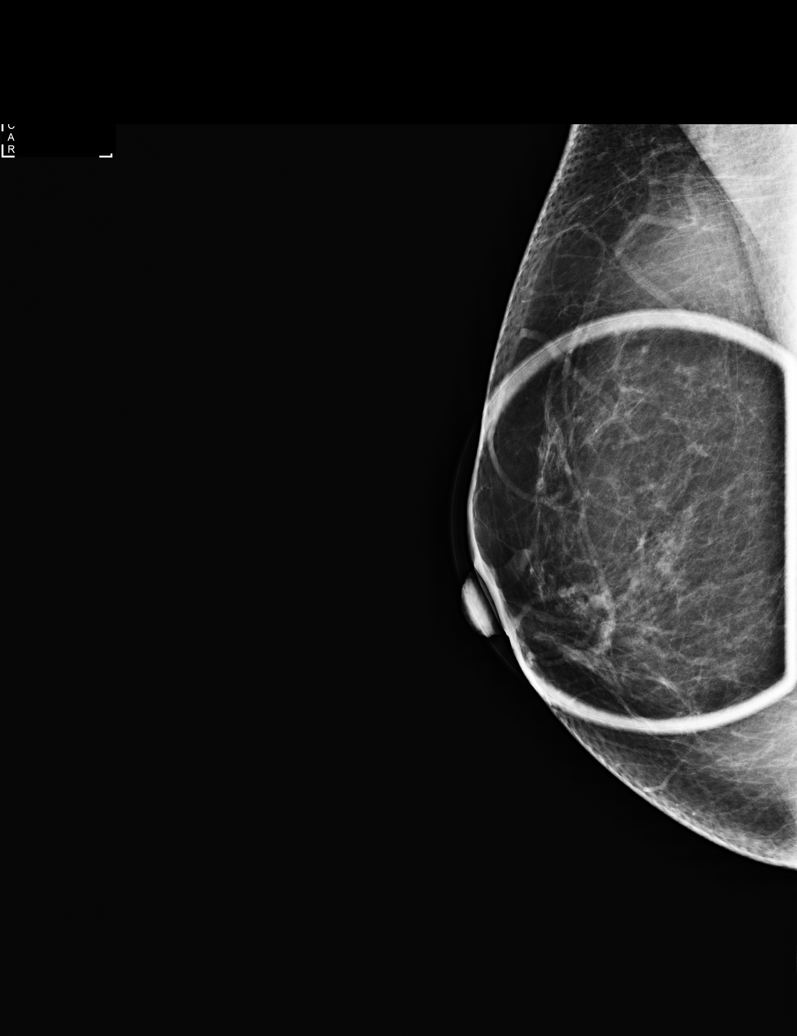

[R CC tomo · tomo slice 39/76.0]
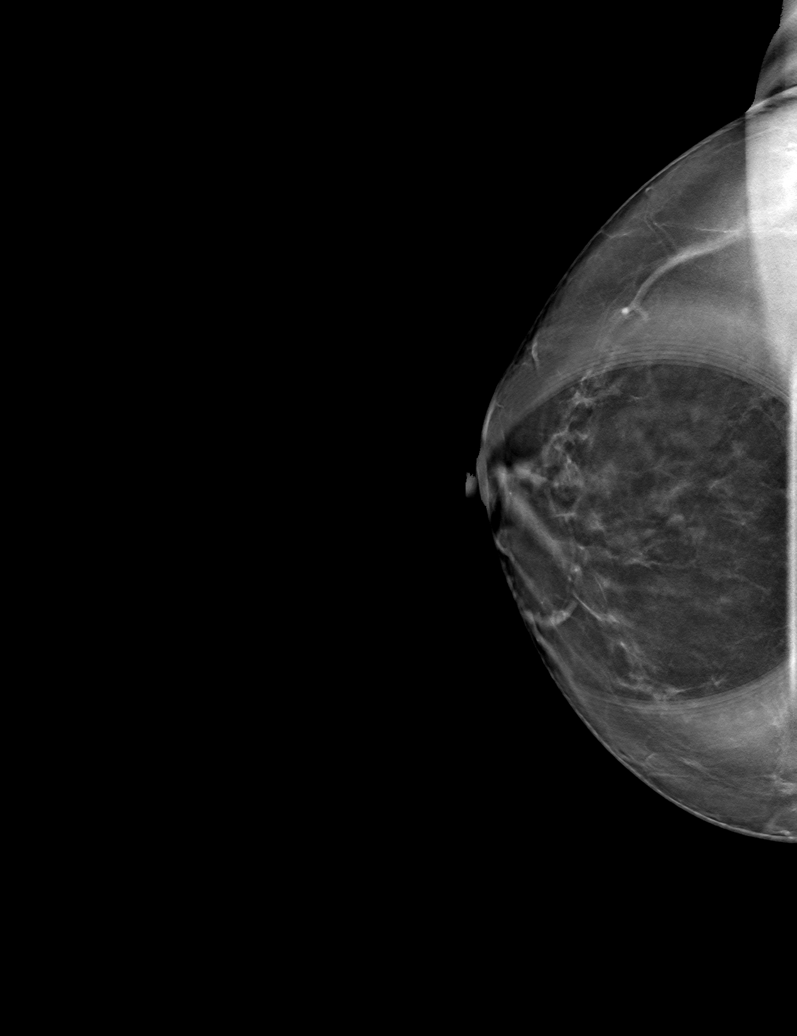

[R MLO tomo · tomo slice 37/73.0]
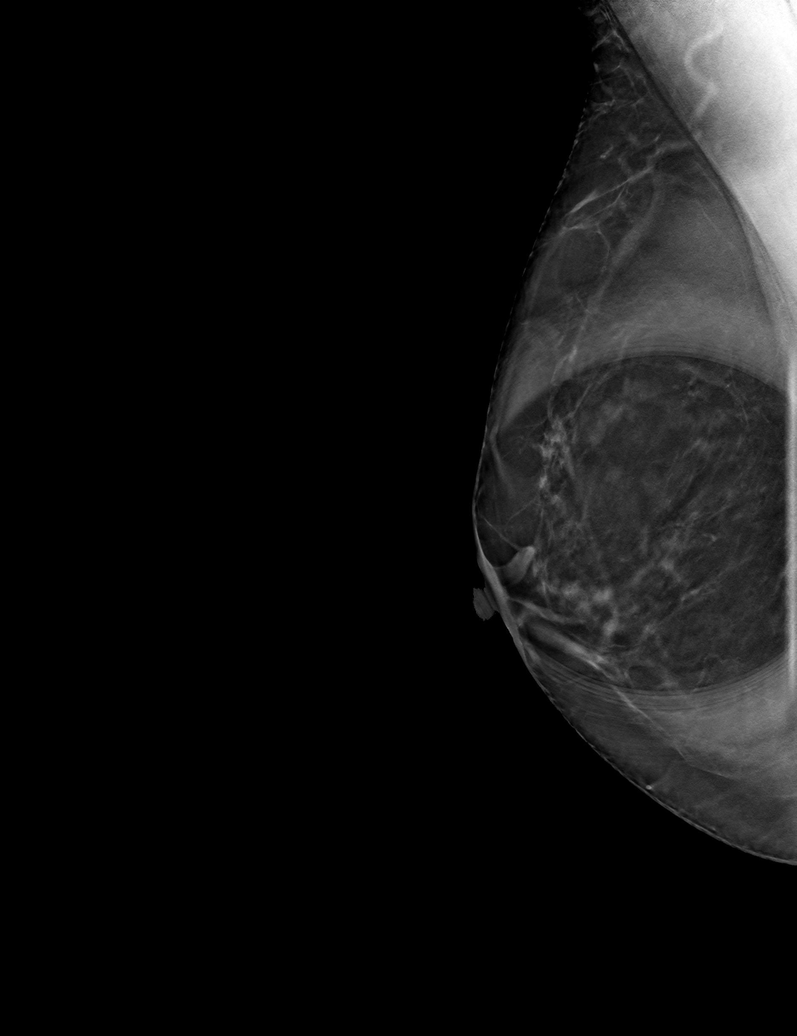

[6 of 14 positions shown; findings below may reference images not displayed]

ACR Breast Density Category b: There are scattered areas of
fibroglandular density.
FINDINGS: A linear, circumscribed masslike structure persists in the
subareolar right breast. The appearance is most suggestive of a
dilated duct. Further evaluation with ultrasound was performed.

Mammographic images were processed with CAD.

Targeted ultrasound is performed, showing multiple, slightly dilated
ducts in the retroareolar position. B appearance correlates with the
mammographic findings. An incidental simple cyst is identified at
the 6 o'clock position measuring 0.5 x 0.4 x 0.2 cm. There is no
internal vascularity.
IMPRESSION: Benign right subareolar duct ectasia and incidental simple cyst. No
further imaging follow-up required.

RECOMMENDATION:
Screening mammogram in one year.(Code:[FG])

I have discussed the findings and recommendations with the patient.
Results were also provided in writing at the conclusion of the
visit. If applicable, a reminder letter will be sent to the patient
regarding the next appointment.

BI-RADS CATEGORY  2: Benign.

## 2017-08-11 MED FILL — LEVOTHYROXINE 88 MCG TABLET: 88 | 90 days supply | Qty: 90 | Fill #1

## 2017-08-30 MED FILL — PILOCARPINE HCL 5 MG TABLET: 5 | 90 days supply | Qty: 360 | Fill #0

## 2017-09-12 DIAGNOSIS — H524 Presbyopia: Secondary | ICD-10-CM | POA: Diagnosis not present

## 2017-09-17 MED FILL — BUPROPION HCL XL 300 MG TAB: 300 | 90 days supply | Qty: 90 | Fill #3

## 2017-09-20 ENCOUNTER — Telehealth: Payer: 59 | Admitting: Family

## 2017-09-20 DIAGNOSIS — M5432 Sciatica, left side: Secondary | ICD-10-CM | POA: Diagnosis not present

## 2017-09-20 DIAGNOSIS — M5431 Sciatica, right side: Secondary | ICD-10-CM | POA: Diagnosis not present

## 2017-09-20 MED ORDER — BACLOFEN 10 MG PO TABS: 10.0000 mg | ORAL_TABLET | Freq: Three times a day (TID) | ORAL | 0 refills | Status: DC

## 2017-09-20 MED ORDER — ETODOLAC 300 MG PO CAPS: 300.0000 mg | ORAL_CAPSULE | Freq: Two times a day (BID) | ORAL | 0 refills | Status: DC

## 2017-09-20 MED FILL — ETODOLAC 300 MG CAPSULE: 300 | 10 days supply | Qty: 20 | Fill #0

## 2017-09-20 MED FILL — BACLOFEN 10 MG TABS: 10 | 10 days supply | Qty: 30 | Fill #0

## 2017-09-20 NOTE — Progress Notes (Signed)

## 2017-10-04 MED FILL — ESTROGEN-METHYLTESTOSTERONE: 1.25-2.5 | 90 days supply | Qty: 90 | Fill #0

## 2017-10-04 MED FILL — ESTRADIOL 1 MG TAB: 1 | 90 days supply | Qty: 90 | Fill #3

## 2017-10-11 MED FILL — GAVILYTE-N SOLUTION: 420 | 2 days supply | Qty: 4000 | Fill #0

## 2017-10-12 DIAGNOSIS — D126 Benign neoplasm of colon, unspecified: Secondary | ICD-10-CM | POA: Diagnosis not present

## 2017-10-12 DIAGNOSIS — Z86010 Personal history of colonic polyps: Secondary | ICD-10-CM | POA: Diagnosis not present

## 2017-10-12 LAB — HM COLONOSCOPY

## 2017-10-29 ENCOUNTER — Encounter: Payer: Self-pay | Admitting: Medical

## 2017-10-29 ENCOUNTER — Ambulatory Visit: Payer: 59 | Admitting: Medical

## 2017-10-29 VITALS — BP 128/80 | HR 93 | Temp 97.9°F | Wt 134.2 lb

## 2017-10-29 DIAGNOSIS — S39011A Strain of muscle, fascia and tendon of abdomen, initial encounter: Secondary | ICD-10-CM | POA: Diagnosis not present

## 2017-10-29 DIAGNOSIS — R1032 Left lower quadrant pain: Secondary | ICD-10-CM

## 2017-10-29 NOTE — Progress Notes (Signed)
Subjective: Chief Complaint  Patient presents with  . Abdominal Pain    pain in lower left   started yesterday    Yesterday had abdominal pain on left, but all night and all day today has had worsening pain.  Has hx/o 3 abdominal surgeries.  Started yesterday after turning/twisting.  No urinary issues, no burning with urination, no blood in urine.  Has daily BM, no recent bowel issues, normally had regular morning BM.  But today has had 3 small BMs.  No fever, no body aches, no chills.  No nausea, no vomiting.  Pain worse with movement.  turning right ward/twisting right causes pain.  Used ibuprofen 2 hours ago, has helped some.  No other aggravating or relieving factors. No other complaint.   Past Medical History:  Diagnosis Date  . Cancer (HCC)    OVARIAN  . Colonic polyp   . Sjogren's disease ([place])   . Thyroid disease    HYPOTHYROIDISM   Current Outpatient Medications on File Prior to Visit  Medication Sig Dispense Refill  . buPROPion (WELLBUTRIN XL) 300 MG 24 hr tablet Take 300 mg by mouth daily.    Marland Kitchen estradiol (ESTRACE) 1 MG tablet Take 1 mg by mouth daily.    Marland Kitchen estrogens-methylTEST 1.25-2.5 MG TABS per tablet Take 1 tablet by mouth daily.    Marland Kitchen etodolac (LODINE) 300 MG capsule Take 1 capsule (300 mg total) by mouth 2 (two) times daily. 20 capsule 0  . levothyroxine (SYNTHROID, LEVOTHROID) 75 MCG tablet Take 75 mcg by mouth daily before breakfast.    . methylphenidate (RITALIN) 20 MG tablet Take 1 tablet (20 mg total) by mouth 3 (three) times daily. 270 tablet 0  . pilocarpine (SALAGEN) 5 MG tablet Take 5 mg by mouth 4 (four) times daily.     No current facility-administered medications on file prior to visit.    Past Surgical History:  Procedure Laterality Date  . ABDOMINAL HYSTERECTOMY     Ovarian cancer  . BREAST CYST ASPIRATION    . CESAREAN SECTION     x2  . COLONOSCOPY  10-06   Dr. Earnest Bailey   ROS as in subjective  Objective: BP 128/80   Pulse 93   Temp 97.9 F  (36.6 C)   Wt 134 lb 3.2 oz (60.9 kg)   SpO2 97%   BMI 22.33 kg/m   General appearance: alert, no distress, WD/WN,  Abdomen: +bs, soft, mild left lower abdominal tenderness when lying but less so when standing.  otherwise non tender, non distended, no masses, no hepatomegaly, no splenomegaly. She does note pain in same area when transferring from lying to sitting or sitting to lying.  No obvious hernia.  She notes less pain standing Back: nontender Pulses: 2+ symmetric, upper and lower extremities, normal cap refill    Assessment: Encounter Diagnoses  Name Primary?  . Strain of abdominal muscle, initial encounter Yes  . Left lower quadrant pain      Plan We discussed her symptoms and findings.  No obvious hernia.  No obvious urinary or bowel issue.  Her symptoms most likely suggestive of oblique abdominal muscle strain.  We discussed using relative rest, no heavy lifting, minimize using the abdominal muscles a lot over the next several days.  Advised no lifting over 15 pounds for the next week.  Can use alternating heat and cool pack localized to the abdomen.  She can use ibuprofen over-the-counter every 6 hours for pain and inflammation for the next 4-5 days.  If not resolved within a week or worsening or the next few days call or return.  Trixy was seen today for abdominal pain.  Diagnoses and all orders for this visit:  Strain of abdominal muscle, initial encounter  Left lower quadrant pain

## 2017-11-02 ENCOUNTER — Telehealth: Payer: Self-pay | Admitting: Family Medicine

## 2017-11-02 NOTE — Telephone Encounter (Signed)
Pt called and states she is much better.  She is ready to return to work for full duty.  Please send letter to her work stating she can return to work full duty.  Fax to [phone] attn:  Chasity Hearn

## 2017-11-02 NOTE — Telephone Encounter (Signed)
That is fine, please write her a note releasing her back to work for full duty.  Thanks

## 2017-11-03 ENCOUNTER — Encounter: Payer: Self-pay | Admitting: Medical

## 2017-11-03 ENCOUNTER — Encounter: Payer: Self-pay | Admitting: Family Medicine

## 2017-11-03 MED ORDER — METHYLPHENIDATE HCL 20 MG PO TABS: 20.0000 mg | ORAL_TABLET | Freq: Three times a day (TID) | ORAL | 0 refills | Status: DC

## 2017-11-03 MED FILL — METHYLPHENIDATE 20 MG TAB: 20 | 90 days supply | Qty: 270 | Fill #0

## 2017-11-09 MED FILL — LEVOTHYROXINE 88 MCG TABLET: 88 | 90 days supply | Qty: 90 | Fill #2

## 2017-11-30 ENCOUNTER — Encounter: Payer: Self-pay | Admitting: Family Medicine

## 2017-12-12 ENCOUNTER — Telehealth: Payer: 59 | Admitting: Family

## 2017-12-12 DIAGNOSIS — K0889 Other specified disorders of teeth and supporting structures: Secondary | ICD-10-CM

## 2017-12-12 NOTE — Progress Notes (Signed)
Based on what you shared with me it looks like you have a serious condition that should be evaluated in a face to face office visit.  NOTE: If you entered your credit card information for this eVisit, you will not be charged. You may see a "hold" on your card for the $30 but that hold will drop off and you will not have a charge processed.  If you are having a true medical emergency please call 911.  If you need an urgent face to face visit, [facility] has four urgent care centers for your convenience.  If you need care fast and have a high deductible or no insurance consider:   [email or web address] to reserve your spot online an avoid wait times  [facility] [address], [address] [place], [place] [address] 8 am to 8 pm Monday-Friday 10 am to 4 pm Saturday-Sunday *Across the street from [employer]  [facility]  [address] [place], [address] 8 am to 5 pm Monday-Friday * In the [facility] the [facility]   The following sites will take your  insurance:  . [facility]  [phone] Get Driving Directions Find a Provider at this Location  [address] [place], [place] [address] . 10 am to 8 pm Monday-Friday . 12 pm to 8 pm Saturday-Sunday   . [facility]  [phone] Get Driving Directions Find a Provider at this Location  [address], [address] [place], [place] [address] . 8 am to 8 pm Monday-Friday . 9 am to 6 pm Saturday . 11 am to 6 pm Sunday   . [facility] [place]  [phone] Get Driving Directions  [address] [place], [place] [address] . 8 am to 8 pm Monday-Friday . 8 am to 4 pm Saturday-Sunday   Your e-visit answers were reviewed by a board certified advanced clinical practitioner to complete your personal care plan.  Thank you for using e-Visits.  

## 2017-12-16 ENCOUNTER — Other Ambulatory Visit: Payer: Self-pay | Admitting: Family Medicine

## 2017-12-16 MED FILL — BUPROPION HCL XL 300 MG TAB: 300 | 90 days supply | Qty: 90 | Fill #0

## 2017-12-16 NOTE — Telephone Encounter (Signed)
[facility] log out pt is requesting to fill pt wellbutrin. Please advise. [place]

## 2017-12-27 MED FILL — AMOXICILLIN 500 MG CAPSULE: 500 | 2 days supply | Qty: 8 | Fill #0

## 2017-12-28 MED FILL — CMPD MMW LID/MAALOX/DIPHEN: 240 days supply | Qty: 240 | Fill #0

## 2017-12-28 MED FILL — ESTRADIOL 1 MG TABS: 1 | 90 days supply | Qty: 90 | Fill #0

## 2017-12-31 MED FILL — ESTROGEN-METHYLTESTOSTERONE: 1.25-2.5 | 90 days supply | Qty: 90 | Fill #1

## 2018-02-10 MED FILL — PILOCARPINE HCL 5 MG TABLET: 5 | 90 days supply | Qty: 360 | Fill #1

## 2018-02-22 MED FILL — LEVOTHYROXINE 88 MCG TABLET: 88 | 90 days supply | Qty: 90 | Fill #3

## 2018-03-21 ENCOUNTER — Telehealth: Payer: Self-pay

## 2018-03-21 NOTE — Telephone Encounter (Signed)
SENT REFERRAL TO SCHEDULING AND FILED NOTES 

## 2018-03-23 ENCOUNTER — Ambulatory Visit (INDEPENDENT_AMBULATORY_CARE_PROVIDER_SITE_OTHER): Payer: Self-pay | Admitting: Emergency Medicine

## 2018-03-23 ENCOUNTER — Encounter: Payer: Self-pay | Admitting: Emergency Medicine

## 2018-03-23 VITALS — BP 148/88 | HR 96 | Temp 98.6°F | Wt 135.2 lb

## 2018-03-23 DIAGNOSIS — R350 Frequency of micturition: Secondary | ICD-10-CM

## 2018-03-23 DIAGNOSIS — N393 Stress incontinence (female) (male): Secondary | ICD-10-CM

## 2018-03-23 LAB — POC URINALSYSI DIPSTICK (AUTOMATED)
Bilirubin, UA: NEGATIVE
Glucose, UA: NEGATIVE
Nitrite, UA: NEGATIVE
Protein, UA: POSITIVE — AB
Spec Grav, UA: 1.025 (ref 1.010–1.025)
Urobilinogen, UA: 0.2 E.U./dL
pH, UA: 6 (ref 5.0–8.0)

## 2018-03-23 MED FILL — CEPHALEXIN 500 MG CAPSULE: 500 | 7 days supply | Qty: 14 | Fill #0

## 2018-03-23 MED FILL — ESTROGEN-METHYLTESTOSTERONE: 1.25-2.5 | 90 days supply | Qty: 90 | Fill #2

## 2018-03-23 MED FILL — ESTRADIOL 1 MG TABS: 1 | 90 days supply | Qty: 90 | Fill #1

## 2018-03-23 NOTE — Progress Notes (Signed)
   Subjective:    Patient ID: Dorothy Wood, female    DOB: [birth year], 57 y.o.   MRN: [id number]  HPI Dorothy Wood is a 57 y.o. female presenting to UC with c/o 1 week of urinary frequency, urgency and stress incontinence.  Symptoms are similar to the last time she had a UTI about 5 years ago.  She has not tried anything for her symptoms because she is not having any pain. Denies fever, chills, n/v/d. She leaves for [place] for 10 days and wanted to be seen and treated before she leaves.     Review of Systems  Constitutional: Negative for chills and fever.  Gastrointestinal: Negative for abdominal pain and nausea.  Genitourinary: Positive for frequency and urgency. Negative for dysuria and hematuria.  Musculoskeletal: Negative for back pain.       Objective:   Physical Exam  Constitutional: She is oriented to person, place, and time. She appears well-developed and well-nourished. No distress.  HENT:  Head: Normocephalic and atraumatic.  Mouth/Throat: Oropharynx is clear and moist.  Eyes: EOM are normal.  Neck: Normal range of motion.  Cardiovascular: Normal rate and regular rhythm.  Pulmonary/Chest: Effort normal and breath sounds normal. No stridor. No respiratory distress. She has no wheezes. She has no rales.  Abdominal: Soft. She exhibits no distension. There is no tenderness. There is no CVA tenderness.  Musculoskeletal: Normal range of motion.  Neurological: She is alert and oriented to person, place, and time.  Skin: Skin is warm and dry. She is not diaphoretic.  Psychiatric: She has a normal mood and affect. Her behavior is normal.  Nursing note and vitals reviewed.       Assessment & Plan:   Urine dipstick: + Leukocytes, trace protein, trace blood  Hx and exam c/w UTI. Will start on Keflex 500mg  BID for 7 days. F/u with PCP in 1 week if not improving.

## 2018-03-24 ENCOUNTER — Other Ambulatory Visit: Payer: Self-pay | Admitting: Family Medicine

## 2018-03-24 MED FILL — buPROPion HCL ER (XL) 300 M: 300 | 90 days supply | Qty: 90 | Fill #0

## 2018-03-24 NOTE — Telephone Encounter (Signed)
Please advise if pt wellbutrin can be fill at [facility] long. Thanks [employer]

## 2018-03-24 NOTE — Telephone Encounter (Signed)
It appears last visit for this medication was 2017.  Needs appt med check of physical

## 2018-03-29 ENCOUNTER — Encounter: Payer: Self-pay | Admitting: Family Medicine

## 2018-03-31 ENCOUNTER — Telehealth: Payer: Self-pay

## 2018-03-31 NOTE — Telephone Encounter (Signed)
FAXED NOTES TO [facility] 

## 2018-04-05 ENCOUNTER — Telehealth: Payer: 59 | Admitting: Family

## 2018-04-05 DIAGNOSIS — A499 Bacterial infection, unspecified: Secondary | ICD-10-CM | POA: Diagnosis not present

## 2018-04-05 DIAGNOSIS — N390 Urinary tract infection, site not specified: Secondary | ICD-10-CM | POA: Diagnosis not present

## 2018-04-05 MED ORDER — NITROFURANTOIN MONOHYD MACRO 100 MG PO CAPS: 100.0000 mg | ORAL_CAPSULE | Freq: Two times a day (BID) | ORAL | 0 refills | Status: DC

## 2018-04-05 MED FILL — NITROFURANTOIN MONO-MCR 100: 100 | 5 days supply | Qty: 10 | Fill #0

## 2018-04-05 NOTE — Progress Notes (Signed)

## 2018-04-13 DIAGNOSIS — H16002 Unspecified corneal ulcer, left eye: Secondary | ICD-10-CM | POA: Diagnosis not present

## 2018-04-21 ENCOUNTER — Ambulatory Visit: Payer: 59 | Admitting: Family Medicine

## 2018-04-21 VITALS — BP 128/74 | HR 91 | Temp 97.2°F | Resp 16 | Ht 65.0 in | Wt 136.2 lb

## 2018-04-21 DIAGNOSIS — F909 Attention-deficit hyperactivity disorder, unspecified type: Secondary | ICD-10-CM

## 2018-04-21 DIAGNOSIS — F341 Dysthymic disorder: Secondary | ICD-10-CM | POA: Diagnosis not present

## 2018-04-21 MED ORDER — METHYLPHENIDATE HCL 20 MG PO TABS
20.0000 mg | ORAL_TABLET | Freq: Three times a day (TID) | ORAL | 0 refills | Status: DC
Start: 2018-04-21 — End: 2018-09-29

## 2018-04-21 MED ORDER — BUPROPION HCL ER (XL) 300 MG PO TB24: 300.0000 mg | ORAL_TABLET | Freq: Every day | ORAL | 3 refills | Status: DC

## 2018-04-21 MED FILL — METHYLPHENIDATE 20 MG TAB: 20 | 90 days supply | Qty: 270 | Fill #0

## 2018-04-21 NOTE — Progress Notes (Signed)
   Subjective:    Patient ID: [place], female    DOB: 1961-01-28, 57 y.o.   MRN: [id number]  HPI She is here for medication check.  She does see her gynecologist regularly who is monitoring her hormones.  She plans to continue on these.  She is doing quite nicely on her Wellbutrin for treatment of an underlying dysthymic pattern.  She is very comfortable with this and is not interested in stopping it.  She also is doing quite nicely on her Adderall.  Last roughly 4 hours.  She uses either 2 or 3 times per day and is very happy with this.   Review of Systems     Objective:   Physical Exam Alert and in no distress otherwise not examined       Assessment & Plan:  Attention deficit hyperactivity disorder (ADHD), unspecified ADHD type - Plan: methylphenidate (RITALIN) 20 MG tablet  Dysthymia - Plan: buPROPion (WELLBUTRIN XL) 300 MG 24 hr tablet Plan to continue on both these medicines indefinitely.

## 2018-05-04 MED FILL — AMOXICILLIN 500 MG CAPSULE: 500 | 2 days supply | Qty: 8 | Fill #0

## 2018-05-06 MED FILL — PILOCARPINE HCL 5 MG TABLET: 5 | 90 days supply | Qty: 360 | Fill #2

## 2018-05-24 DIAGNOSIS — Z0000 Encounter for general adult medical examination without abnormal findings: Secondary | ICD-10-CM | POA: Diagnosis not present

## 2018-05-24 DIAGNOSIS — E039 Hypothyroidism, unspecified: Secondary | ICD-10-CM | POA: Diagnosis not present

## 2018-05-24 DIAGNOSIS — Z01419 Encounter for gynecological examination (general) (routine) without abnormal findings: Secondary | ICD-10-CM | POA: Diagnosis not present

## 2018-05-24 MED FILL — NITROFURANTOIN MONO-MCR 100: 100 | 5 days supply | Qty: 10 | Fill #0

## 2018-05-26 MED FILL — LEVOTHYROXINE 88 MCG TABLET: 88 | 90 days supply | Qty: 90 | Fill #0

## 2018-06-13 NOTE — Progress Notes (Signed)
Elzie Rings, MD Reason for referral-Hypertension  HPI: 57 year old female for evaluation of hypertension and family history of coronary artery disease at request of Vania Rea MD. She is asymptomatic and denies dyspnea on exertion, orthopnea, PND, pedal edema, chest pain.  She states she has had high blood pressure for 2 years when monitoring it at work and home.  She has not taken medications previously.  Current Outpatient Medications  Medication Sig Dispense Refill  . buPROPion (WELLBUTRIN XL) 300 MG 24 hr tablet Take 1 tablet (300 mg total) by mouth daily. 90 tablet 3  . estradiol (ESTRACE) 1 MG tablet Take 1 mg by mouth daily.    Marland Kitchen estrogens-methylTEST 1.25-2.5 MG TABS per tablet Take 1 tablet by mouth daily.    Marland Kitchen levothyroxine (SYNTHROID, LEVOTHROID) 75 MCG tablet Take 75 mcg by mouth daily before breakfast.    . methylphenidate (RITALIN) 20 MG tablet Take 1 tablet (20 mg total) by mouth 3 (three) times daily. 270 tablet 0  . pilocarpine (SALAGEN) 5 MG tablet Take 5 mg by mouth 4 (four) times daily.     No current facility-administered medications for this visit.     No Known Allergies   Past Medical History:  Diagnosis Date  . Cancer (HCC)    OVARIAN  . Colonic polyp   . Hyperlipidemia   . Hypertension   . Sjogren's disease ([place])   . Thyroid disease    HYPOTHYROIDISM    Past Surgical History:  Procedure Laterality Date  . ABDOMINAL HYSTERECTOMY     Ovarian cancer  . BREAST CYST ASPIRATION    . CESAREAN SECTION     x2  . COLONOSCOPY  10-06   Dr. Earnest Bailey  . TONSILLECTOMY      Social History   Socioeconomic History  . Marital status: Married    Spouse name: Not on file  . Number of children: 2  . Years of education: Not on file  . Highest education level: Not on file  Occupational History  . Not on file  Social Needs  . Financial resource strain: Not on file  . Food insecurity:    Worry: Not on file    Inability: Not on file  .  Transportation needs:    Medical: Not on file    Non-medical: Not on file  Tobacco Use  . Smoking status: Never Smoker  . Smokeless tobacco: Never Used  Substance and Sexual Activity  . Alcohol use: Yes    Comment: 6 glasses per week  . Drug use: No  . Sexual activity: Not Currently  Lifestyle  . Physical activity:    Days per week: Not on file    Minutes per session: Not on file  . Stress: Not on file  Relationships  . Social connections:    Talks on phone: Not on file    Gets together: Not on file    Attends religious service: Not on file    Active member of club or organization: Not on file    Attends meetings of clubs or organizations: Not on file    Relationship status: Not on file  . Intimate partner violence:    Fear of current or ex partner: Not on file    Emotionally abused: Not on file    Physically abused: Not on file    Forced sexual activity: Not on file  Other Topics Concern  . Not on file  Social History Narrative  . Not on file    Family  History  Problem Relation Age of Onset  . Mental illness Mother   . Hypertension Mother   . Heart failure Mother   . Mental illness Sister   . Mental illness Brother   . Mental illness Maternal Uncle   . Mental illness Maternal Grandmother   . Cancer Paternal Grandmother   . CAD Father        CABG at age 42    ROS: no fevers or chills, productive cough, hemoptysis, dysphasia, odynophagia, melena, hematochezia, dysuria, hematuria, rash, seizure activity, orthopnea, PND, pedal edema, claudication. Remaining systems are negative.  Physical Exam:   Blood pressure (!) 162/82, pulse 85, height 5\' 5"  (1.651 m), weight 137 lb 12.8 oz (62.5 kg).  General:  Well developed/well nourished in NAD Skin warm/dry Patient not depressed No peripheral clubbing Back-normal HEENT-normal/normal eyelids Neck supple/normal carotid upstroke bilaterally; no bruits; no JVD; no thyromegaly chest - CTA/ normal expansion CV -  RRR/normal S1 and S2; no murmurs, rubs or gallops;  PMI nondisplaced Abdomen -NT/ND, no HSM, no mass, + bowel sounds, no bruit 2+ femoral pulses, no bruits Ext-no edema, chords, 2+ DP Neuro-grossly nonfocal  ECG -normal sinus rhythm at a rate of 85.  Left axis deviation.  Incomplete right bundle branch block.  Left ventricular hypertrophy.  Personally reviewed  A/P  1 hypertension-patient's blood pressure is elevated.  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  I will begin hydrochlorothiazide 25 mg daily.  Check potassium and renal function in 1 week.  Follow blood pressure and advance regimen as needed.  Note she will continue with lifestyle modification though she is not overweight, does not smoke and does not abuse alcohol.  I have recommended a low-sodium diet.  Note she has left ventricular hypertrophy on her ECG.  Schedule echocardiogram to further assess.  2 abnormal ECG-LVH noted.  As outlined schedule echocardiogram to further assess.  3 hyperlipidemia-we will plan to check fasting lipids when she comes for her bmet.   Kirk Ruths, MD

## 2018-06-17 ENCOUNTER — Encounter: Payer: Self-pay | Admitting: Cardiology

## 2018-06-17 ENCOUNTER — Ambulatory Visit: Payer: 59 | Admitting: Cardiology

## 2018-06-17 VITALS — BP 162/82 | HR 85 | Ht 65.0 in | Wt 137.8 lb

## 2018-06-17 DIAGNOSIS — R9431 Abnormal electrocardiogram [ECG] [EKG]: Secondary | ICD-10-CM | POA: Diagnosis not present

## 2018-06-17 DIAGNOSIS — I10 Essential (primary) hypertension: Secondary | ICD-10-CM

## 2018-06-17 DIAGNOSIS — E7800 Pure hypercholesterolemia, unspecified: Secondary | ICD-10-CM | POA: Diagnosis not present

## 2018-06-17 MED ORDER — HYDROCHLOROTHIAZIDE 25 MG PO TABS: 25.0000 mg | ORAL_TABLET | Freq: Every day | ORAL | 3 refills | Status: DC

## 2018-06-17 MED FILL — HYDROCHLOROTHIAZIDE 25 MG T: 25 | 90 days supply | Qty: 90 | Fill #0

## 2018-06-17 NOTE — Patient Instructions (Signed)
Medication Instructions:  START HCTZ 25 MG ONCE DAILY If you need a refill on your cardiac medications before your next appointment, please call your pharmacy.   Lab work: Your physician recommends that you return for lab work in: [place] If you have labs (blood work) drawn today and your tests are completely normal, you will receive your results only by: Marland Kitchen MyChart Message (if you have MyChart) OR . A paper copy in the mail If you have any lab test that is abnormal or we need to change your treatment, we will call you to review the results.  Testing/Procedures: Your physician has requested that you have an echocardiogram. Echocardiography is a painless test that uses sound waves to create images of your heart. It provides your doctor with information about the size and shape of your heart and how well your heart's chambers and valves are working. This procedure takes approximately one hour. There are no restrictions for this procedure.    Follow-Up: At [facility], you and your health needs are our priority.  As part of our continuing mission to provide you with exceptional heart care, we have created designated Provider Care Teams.  These Care Teams include your primary Cardiologist (physician) and Advanced Practice Providers (APPs -  Physician Assistants and Nurse Practitioners) who all work together to provide you with the care you need, when you need it. You will need a follow up appointment in 3 months.  Please call our office 2 months in advance to schedule this appointment.  You may see DR Stanford Breed or one of the following Advanced Practice Providers on your designated Care Team:   Kerin Ransom, PA-C Roby Lofts, [place] . Sande Rives, PA-C

## 2018-06-21 ENCOUNTER — Ambulatory Visit (HOSPITAL_COMMUNITY): Payer: 59 | Attending: Cardiology

## 2018-06-21 ENCOUNTER — Other Ambulatory Visit: Payer: Self-pay

## 2018-06-21 DIAGNOSIS — I10 Essential (primary) hypertension: Secondary | ICD-10-CM | POA: Diagnosis not present

## 2018-06-29 MED FILL — buPROPion HCL ER (XL) 300 M: 300 | 90 days supply | Qty: 90 | Fill #0

## 2018-06-30 DIAGNOSIS — I10 Essential (primary) hypertension: Secondary | ICD-10-CM | POA: Diagnosis not present

## 2018-06-30 LAB — BASIC METABOLIC PANEL
BUN / CREAT RATIO: 18 (ref 9–23)
BUN: 16 mg/dL (ref 6–24)
CHLORIDE: 100 mmol/L (ref 96–106)
CO2: 26 mmol/L (ref 20–29)
Calcium: 9.7 mg/dL (ref 8.7–10.2)
Creatinine, Ser: 0.89 mg/dL (ref 0.57–1.00)
GFR calc Af Amer: 83 mL/min/{1.73_m2} (ref >59)
GFR calc non Af Amer: 72 mL/min/{1.73_m2} (ref >59)
GLUCOSE: 94 mg/dL (ref 65–99)
Potassium: 4.8 mmol/L (ref 3.5–5.2)
SODIUM: 140 mmol/L (ref 134–144)

## 2018-06-30 LAB — LIPID PANEL
CHOLESTEROL TOTAL: 225 mg/dL — AB (ref 100–199)
Chol/HDL Ratio: 4.3 ratio (ref 0.0–4.4)
HDL: 52 mg/dL (ref >39)
LDL Calculated: 136 mg/dL — ABNORMAL HIGH (ref 0–99)
Triglycerides: 184 mg/dL — ABNORMAL HIGH (ref 0–149)
VLDL Cholesterol Cal: 37 mg/dL (ref 5–40)

## 2018-07-04 MED FILL — AMOXICILLIN 500 MG CAPSULE: 500 | 2 days supply | Qty: 8 | Fill #1

## 2018-07-06 ENCOUNTER — Other Ambulatory Visit: Payer: Self-pay | Admitting: Obstetrics & Gynecology

## 2018-07-06 DIAGNOSIS — Z1231 Encounter for screening mammogram for malignant neoplasm of breast: Secondary | ICD-10-CM

## 2018-07-13 MED FILL — ESTROGEN-METHYLTESTOSTERONE: 1.25-2.5 | 90 days supply | Qty: 90 | Fill #0

## 2018-07-13 MED FILL — ESTRADIOL 1 MG TABLET: 1 | 90 days supply | Qty: 90 | Fill #2

## 2018-08-16 ENCOUNTER — Ambulatory Visit
Admission: RE | Admit: 2018-08-16 | Discharge: 2018-08-16 | Disposition: A | Discharge status: Home / Self Care | Payer: 59 | Source: Ambulatory Visit | Attending: Obstetrics & Gynecology | Admitting: Obstetrics & Gynecology

## 2018-08-16 DIAGNOSIS — Z1231 Encounter for screening mammogram for malignant neoplasm of breast: Secondary | ICD-10-CM

## 2018-08-16 LAB — HM MAMMOGRAPHY

## 2018-08-16 IMAGING — MG DIGITAL SCREENING BILATERAL MAMMOGRAM WITH TOMO AND CAD
8 series · 8 of 24 positions shown · non-contrast
Comparison: Previous exam(s).

CLINICAL DATA: Screening.

EXAM:
DIGITAL SCREENING BILATERAL MAMMOGRAM WITH TOMO AND CAD

[R CC synth-2D]
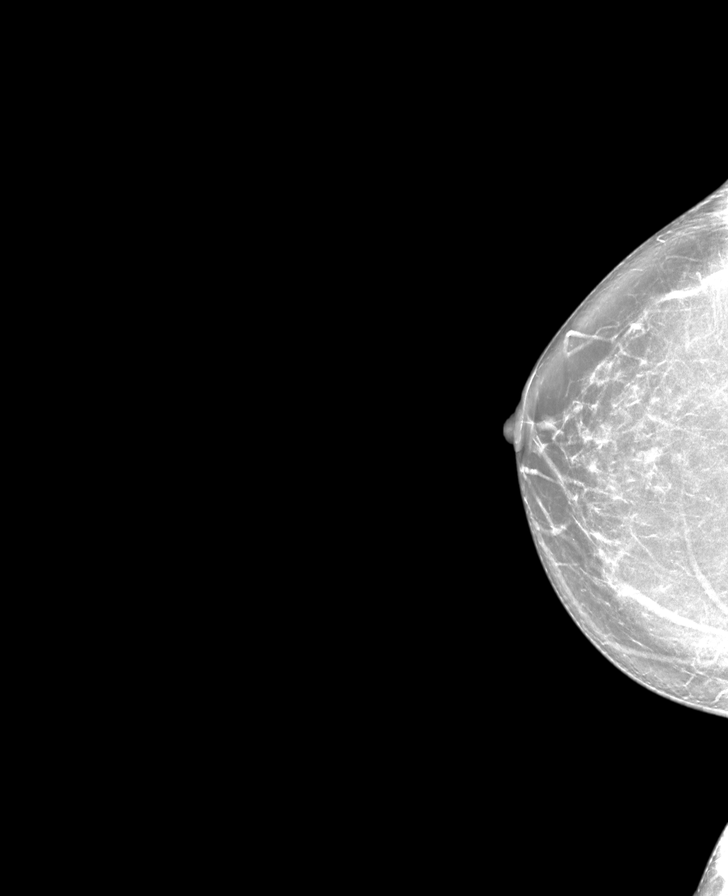

[L MLO synth-2D]
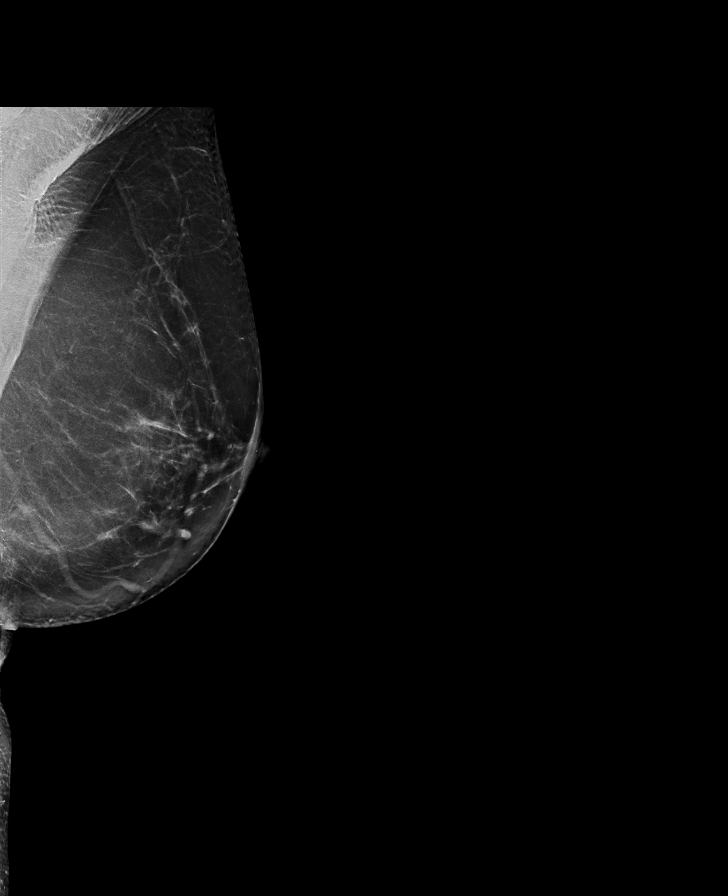

[L CC synth-2D]
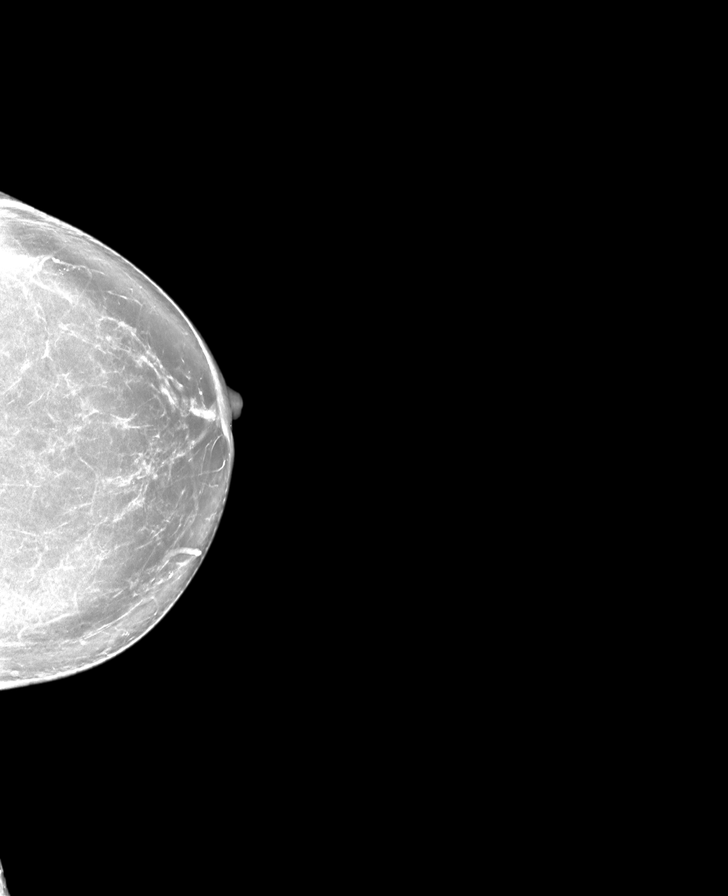

[R MLO synth-2D]
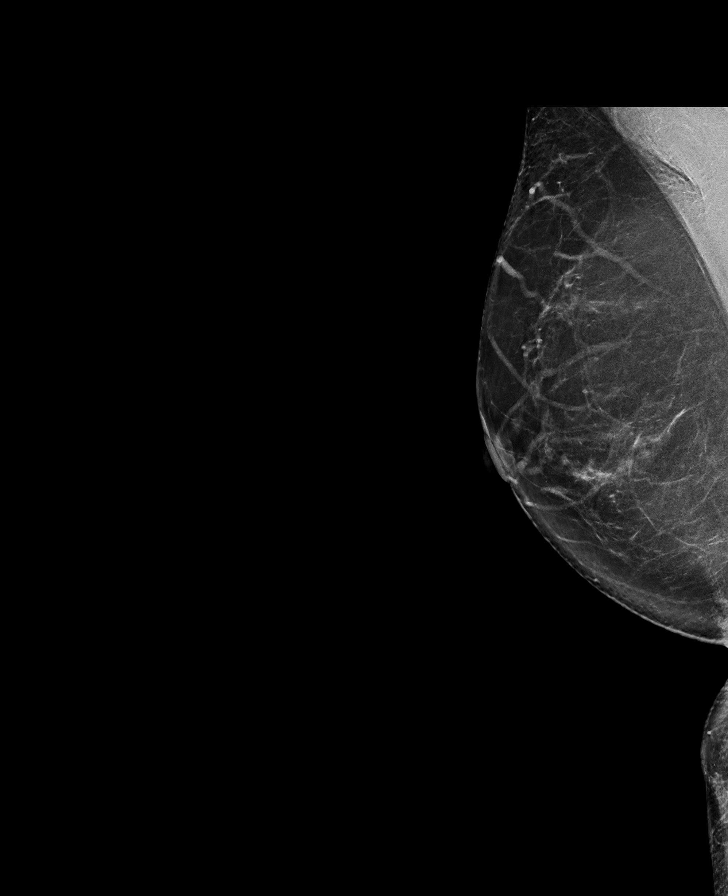

[L MLO tomo · tomo slice 45/88.0]
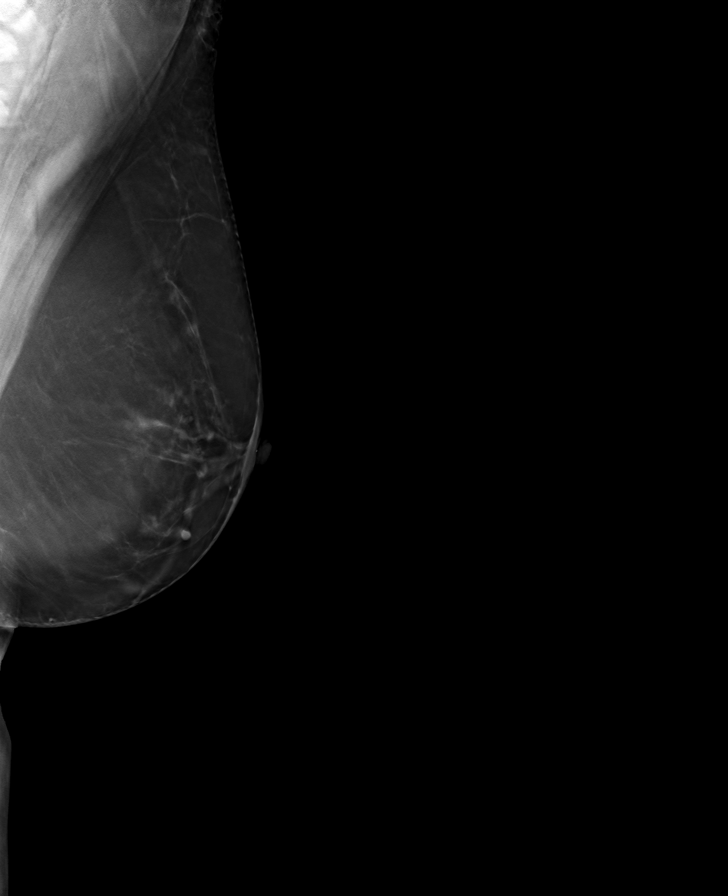

[R MLO tomo · tomo slice 41/81.0]
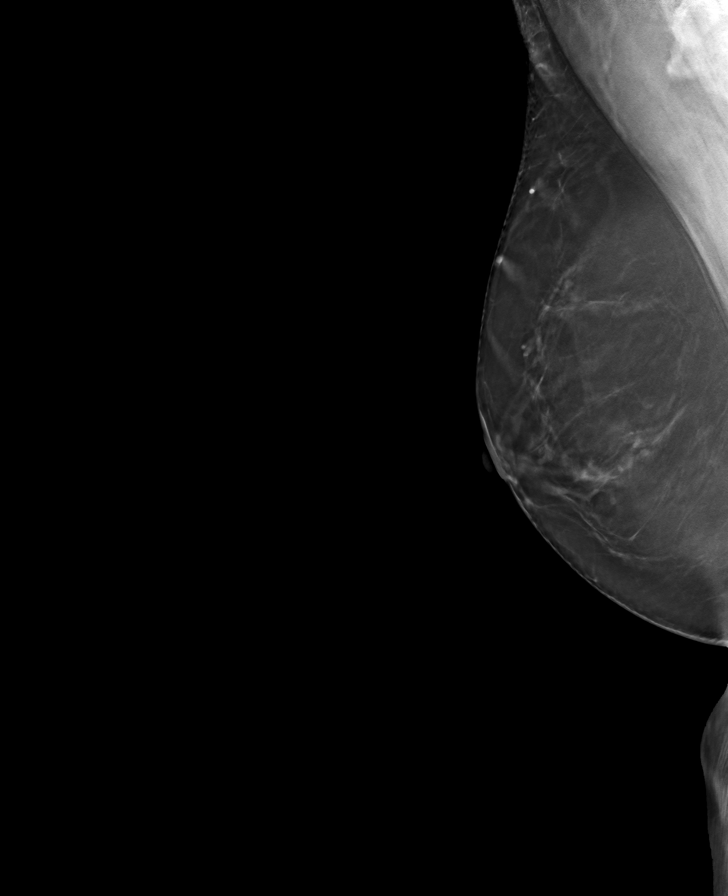

[L CC tomo · tomo slice 41/82.0]
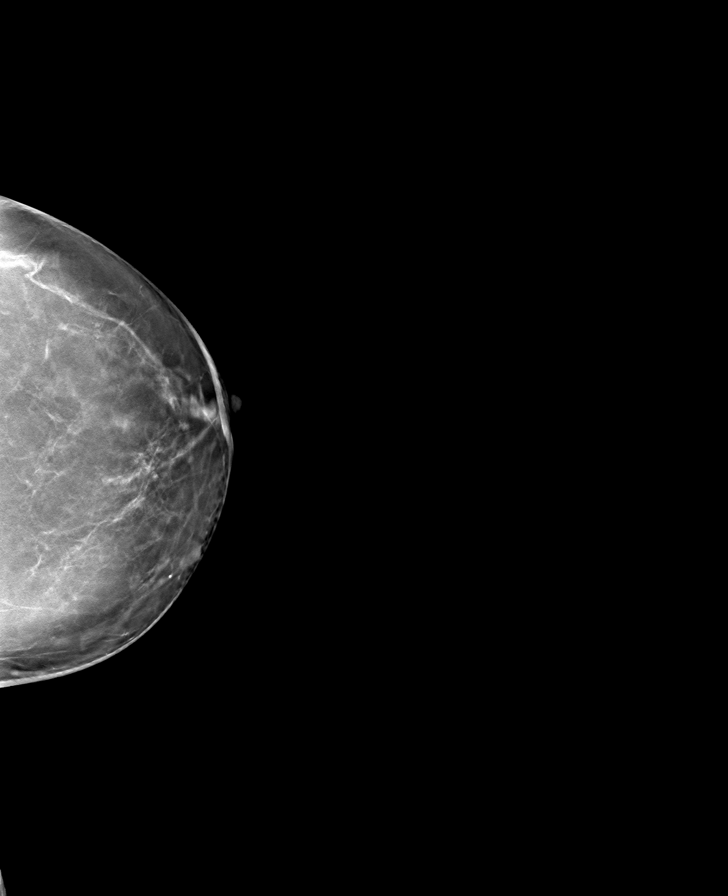

[R CC tomo · tomo slice 41/81.0]
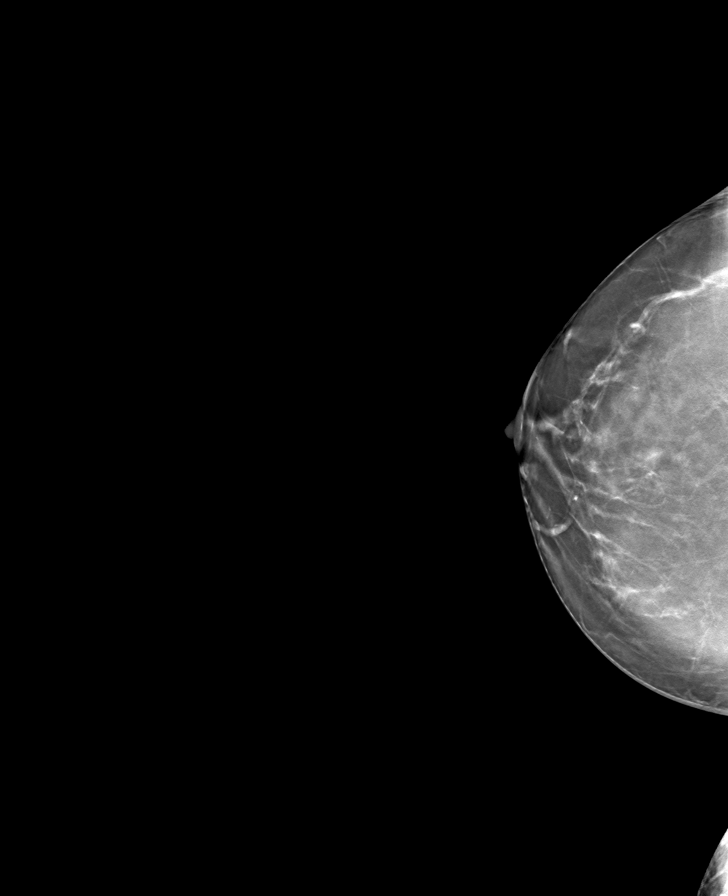

[8 of 24 positions shown; findings below may reference images not displayed]

ACR Breast Density Category b: There are scattered areas of
fibroglandular density.
FINDINGS: There are no findings suspicious for malignancy. Images were
processed with CAD.
IMPRESSION: No mammographic evidence of malignancy. A result letter of this
screening mammogram will be mailed directly to the patient.

RECOMMENDATION:
Screening mammogram in one year. (Code:[TQ])

BI-RADS CATEGORY  1: Negative.

## 2018-09-02 MED FILL — LEVOTHYROXINE 88 MCG TABLET: 88 | 90 days supply | Qty: 90 | Fill #1

## 2018-09-05 ENCOUNTER — Encounter: Payer: Self-pay | Admitting: Family Medicine

## 2018-09-12 NOTE — Progress Notes (Signed)
HPI: FU hypertension and family h/o CAD.   Echocardiogram October 2019 showed normal LV function.  Since last seen, the patient denies any dyspnea on exertion, orthopnea, PND, pedal edema, palpitations, syncope or chest pain. Husband recently diagnosed with possible lymphoma.   Current Outpatient Medications  Medication Sig Dispense Refill  . buPROPion (WELLBUTRIN XL) 300 MG 24 hr tablet Take 1 tablet (300 mg total) by mouth daily. 90 tablet 3  . estradiol (ESTRACE) 1 MG tablet Take 1 mg by mouth daily.    Marland Kitchen estrogens-methylTEST 1.25-2.5 MG TABS per tablet Take 1 tablet by mouth daily.    . hydrochlorothiazide (HYDRODIURIL) 25 MG tablet Take 1 tablet (25 mg total) by mouth daily. 90 tablet 3  . levothyroxine (SYNTHROID, LEVOTHROID) 75 MCG tablet Take 75 mcg by mouth daily before breakfast.    . methylphenidate (RITALIN) 20 MG tablet Take 1 tablet (20 mg total) by mouth 3 (three) times daily. 270 tablet 0  . pilocarpine (SALAGEN) 5 MG tablet Take 5 mg by mouth 4 (four) times daily.     No current facility-administered medications for this visit.      Past Medical History:  Diagnosis Date  . Cancer (HCC)    OVARIAN  . Colonic polyp   . Hyperlipidemia   . Hypertension   . Sjogren's disease ([place])   . Thyroid disease    HYPOTHYROIDISM    Past Surgical History:  Procedure Laterality Date  . ABDOMINAL HYSTERECTOMY     Ovarian cancer  . BREAST CYST ASPIRATION    . CESAREAN SECTION     x2  . COLONOSCOPY  10-06   Dr. Earnest Bailey  . TONSILLECTOMY      Social History   Socioeconomic History  . Marital status: Married    Spouse name: Not on file  . Number of children: 2  . Years of education: Not on file  . Highest education level: Not on file  Occupational History  . Not on file  Social Needs  . Financial resource strain: Not on file  . Food insecurity:    Worry: Not on file    Inability: Not on file  . Transportation needs:    Medical: Not on file    Non-medical:  Not on file  Tobacco Use  . Smoking status: Never Smoker  . Smokeless tobacco: Never Used  Substance and Sexual Activity  . Alcohol use: Yes    Comment: 6 glasses per week  . Drug use: No  . Sexual activity: Not Currently  Lifestyle  . Physical activity:    Days per week: Not on file    Minutes per session: Not on file  . Stress: Not on file  Relationships  . Social connections:    Talks on phone: Not on file    Gets together: Not on file    Attends religious service: Not on file    Active member of club or organization: Not on file    Attends meetings of clubs or organizations: Not on file    Relationship status: Not on file  . Intimate partner violence:    Fear of current or ex partner: Not on file    Emotionally abused: Not on file    Physically abused: Not on file    Forced sexual activity: Not on file  Other Topics Concern  . Not on file  Social History Narrative  . Not on file    Family History  Problem Relation Age of Onset  .  Mental illness Mother   . Hypertension Mother   . Heart failure Mother   . Mental illness Sister   . Mental illness Brother   . Mental illness Maternal Uncle   . Mental illness Maternal Grandmother   . Cancer Paternal Grandmother   . CAD Father        CABG at age 83    ROS: no fevers or chills, productive cough, hemoptysis, dysphasia, odynophagia, melena, hematochezia, dysuria, hematuria, rash, seizure activity, orthopnea, PND, pedal edema, claudication. Remaining systems are negative.  Physical Exam: Well-developed well-nourished in no acute distress.  Skin is warm and dry.  HEENT is normal.  Neck is supple.  Chest is clear to auscultation with normal expansion.  Cardiovascular exam is regular rate and rhythm.  Abdominal exam nontender or distended. No masses palpated. Extremities show no edema. neuro grossly intact  A/P  1 hypertension-patient's blood pressure has improved but remains mildly elevated. Add losartan 50 mg  daily.  Check potassium and renal function in 1 week.  Follow blood pressure at home and adjust regimen as needed.  2 hyperlipidemia-cholesterol October showed total 225 with LDL 136.  She does not have known coronary disease or diabetes mellitus.  Plan to continue diet.  She will need follow-up lipids in the future.  3 abnormal electrocardiogram-LVH noted on previous ECG.  However no LVH on echocardiogram; personally reviewed.  Kirk Ruths, MD

## 2018-09-15 ENCOUNTER — Ambulatory Visit: Payer: 59 | Admitting: Cardiology

## 2018-09-15 ENCOUNTER — Encounter: Payer: Self-pay | Admitting: Cardiology

## 2018-09-15 VITALS — BP 142/70 | HR 76 | Ht 65.0 in | Wt 133.0 lb

## 2018-09-15 DIAGNOSIS — E7800 Pure hypercholesterolemia, unspecified: Secondary | ICD-10-CM

## 2018-09-15 DIAGNOSIS — I10 Essential (primary) hypertension: Secondary | ICD-10-CM

## 2018-09-15 MED ORDER — LOSARTAN POTASSIUM 50 MG PO TABS: 50.0000 mg | ORAL_TABLET | Freq: Every day | ORAL | 3 refills | Status: DC

## 2018-09-15 MED FILL — HYDROCHLOROTHIAZIDE 25 MG T: 25 | 90 days supply | Qty: 90 | Fill #1

## 2018-09-15 MED FILL — LOSARTAN POTASSIUM 50 MG TA: 50 | 30 days supply | Qty: 30 | Fill #0

## 2018-09-15 NOTE — Patient Instructions (Signed)
Medication Instructions:  START LOSARTAN 50 MG ONCE DAILY If you need a refill on your cardiac medications before your next appointment, please call your pharmacy.   Lab work: Your physician recommends that you return for lab work in: [place] If you have labs (blood work) drawn today and your tests are completely normal, you will receive your results only by: Marland Kitchen MyChart Message (if you have MyChart) OR . A paper copy in the mail If you have any lab test that is abnormal or we need to change your treatment, we will call you to review the results.  Follow-Up: At [facility], you and your health needs are our priority.  As part of our continuing mission to provide you with exceptional heart care, we have created designated Provider Care Teams.  These Care Teams include your primary Cardiologist (physician) and Advanced Practice Providers (APPs -  Physician Assistants and Nurse Practitioners) who all work together to provide you with the care you need, when you need it. You will need a follow up appointment in 12 months.  Please call our office 2 months in advance to schedule this appointment.  You may see Kirk Ruths MD or one of the following Advanced Practice Providers on your designated Care Team:   Kerin Ransom, PA-C Roby Lofts, [place] . Sande Rives, PA-C  CALL IN September FOR AN APPOINTMENT IN [place]

## 2018-09-29 ENCOUNTER — Encounter: Payer: Self-pay | Admitting: Family Medicine

## 2018-09-29 DIAGNOSIS — F909 Attention-deficit hyperactivity disorder, unspecified type: Secondary | ICD-10-CM

## 2018-09-29 MED ORDER — METHYLPHENIDATE HCL 20 MG PO TABS: 20.0000 mg | ORAL_TABLET | Freq: Three times a day (TID) | ORAL | 0 refills | Status: DC

## 2018-09-29 MED FILL — METHYLPHENIDATE 20 MG TAB: 20 | 90 days supply | Qty: 270 | Fill #0

## 2018-10-04 MED FILL — buPROPion HCL ER (XL) 300 M: 300 | 90 days supply | Qty: 90 | Fill #1

## 2018-10-04 MED FILL — NITROFURANTOIN MONO-MCR 100: 100 | 5 days supply | Qty: 10 | Fill #1

## 2018-10-13 MED FILL — ESTROGEN-METHYLTESTOSTERONE: 1.25-2.5 | 90 days supply | Qty: 90 | Fill #1

## 2018-10-13 MED FILL — LOSARTAN POTASSIUM 50 MG TA: 50 | 30 days supply | Qty: 30 | Fill #1

## 2018-10-13 MED FILL — ESTRADIOL 1 MG TABS: 1 | 90 days supply | Qty: 90 | Fill #3

## 2018-10-19 ENCOUNTER — Encounter: Payer: Self-pay | Admitting: *Deleted

## 2018-10-27 DIAGNOSIS — I10 Essential (primary) hypertension: Secondary | ICD-10-CM | POA: Diagnosis not present

## 2018-10-28 LAB — BASIC METABOLIC PANEL
BUN/Creatinine Ratio: 12 (ref 9–23)
BUN: 10 mg/dL (ref 6–24)
CALCIUM: 9.8 mg/dL (ref 8.7–10.2)
CHLORIDE: 96 mmol/L (ref 96–106)
CO2: 26 mmol/L (ref 20–29)
CREATININE: 0.85 mg/dL (ref 0.57–1.00)
GFR calc non Af Amer: 76 mL/min/{1.73_m2} (ref >59)
GFR, EST AFRICAN AMERICAN: 87 mL/min/{1.73_m2} (ref >59)
GLUCOSE: 68 mg/dL (ref 65–99)
Potassium: 4 mmol/L (ref 3.5–5.2)
Sodium: 138 mmol/L (ref 134–144)

## 2018-11-11 MED FILL — LOSARTAN POTASSIUM 50 MG TA: 50 | 30 days supply | Qty: 30 | Fill #2

## 2018-11-22 DIAGNOSIS — Z0120 Encounter for dental examination and cleaning without abnormal findings: Secondary | ICD-10-CM | POA: Diagnosis not present

## 2018-11-22 DIAGNOSIS — Z411 Encounter for cosmetic surgery: Secondary | ICD-10-CM | POA: Diagnosis not present

## 2018-11-22 DIAGNOSIS — M2623 Excessive horizontal overlap: Secondary | ICD-10-CM | POA: Diagnosis not present

## 2018-11-22 DIAGNOSIS — L821 Other seborrheic keratosis: Secondary | ICD-10-CM | POA: Diagnosis not present

## 2018-11-29 DIAGNOSIS — M2623 Excessive horizontal overlap: Secondary | ICD-10-CM | POA: Diagnosis not present

## 2018-11-29 DIAGNOSIS — Z0120 Encounter for dental examination and cleaning without abnormal findings: Secondary | ICD-10-CM | POA: Diagnosis not present

## 2018-12-05 MED FILL — LEVOTHYROXINE 88 MCG TABLET: 88 | 90 days supply | Qty: 90 | Fill #2

## 2018-12-06 DIAGNOSIS — Z0120 Encounter for dental examination and cleaning without abnormal findings: Secondary | ICD-10-CM | POA: Diagnosis not present

## 2018-12-06 DIAGNOSIS — M2623 Excessive horizontal overlap: Secondary | ICD-10-CM | POA: Diagnosis not present

## 2018-12-16 MED FILL — LOSARTAN POTASSIUM 50 MG TA: 50 | 30 days supply | Qty: 30 | Fill #3

## 2018-12-19 MED FILL — HYDROCHLOROTHIAZIDE 25 MG T: 25 | 90 days supply | Qty: 90 | Fill #2

## 2018-12-21 ENCOUNTER — Encounter: Payer: Self-pay | Admitting: Family Medicine

## 2018-12-21 DIAGNOSIS — M3500 Sicca syndrome, unspecified: Secondary | ICD-10-CM

## 2018-12-21 MED ORDER — PILOCARPINE HCL 5 MG PO TABS: 5.0000 mg | ORAL_TABLET | Freq: Four times a day (QID) | ORAL | 3 refills | Status: DC

## 2018-12-21 MED FILL — PILOCARPINE HCL 5 MG TABLET: 5 | 90 days supply | Qty: 360 | Fill #0

## 2018-12-29 DIAGNOSIS — F902 Attention-deficit hyperactivity disorder, combined type: Secondary | ICD-10-CM | POA: Diagnosis not present

## 2018-12-29 DIAGNOSIS — F419 Anxiety disorder, unspecified: Secondary | ICD-10-CM | POA: Diagnosis not present

## 2018-12-29 MED FILL — LORazepam 1 MG TABS: 1 | 7 days supply | Qty: 7 | Fill #0

## 2018-12-29 MED FILL — guanFACINE HCL 2 MG TABS: 2 | 30 days supply | Qty: 30 | Fill #0

## 2019-01-05 DIAGNOSIS — M2623 Excessive horizontal overlap: Secondary | ICD-10-CM | POA: Diagnosis not present

## 2019-01-05 DIAGNOSIS — Z0120 Encounter for dental examination and cleaning without abnormal findings: Secondary | ICD-10-CM | POA: Diagnosis not present

## 2019-01-06 MED FILL — LOSARTAN POTASSIUM 50 MG TA: 50 | 30 days supply | Qty: 30 | Fill #4

## 2019-01-06 MED FILL — ESTRADIOL 1 MG TABS: 1 | 90 days supply | Qty: 90 | Fill #0

## 2019-01-06 MED FILL — ESTROGEN-METHYLTESTOSTERONE: 1.25-2.5 | 90 days supply | Qty: 90 | Fill #2

## 2019-01-06 MED FILL — buPROPion HCL ER (XL) 300 M: 300 | 90 days supply | Qty: 90 | Fill #2

## 2019-01-10 MED FILL — NITROFURANTOIN MONO-MCR 100: 100 | 5 days supply | Qty: 10 | Fill #2

## 2019-01-26 DIAGNOSIS — F902 Attention-deficit hyperactivity disorder, combined type: Secondary | ICD-10-CM | POA: Diagnosis not present

## 2019-01-26 DIAGNOSIS — F419 Anxiety disorder, unspecified: Secondary | ICD-10-CM | POA: Diagnosis not present

## 2019-01-26 MED FILL — METHYLPHENIDATE 20 MG TAB: 20 | 30 days supply | Qty: 90 | Fill #0

## 2019-01-26 MED FILL — guanFACINE HCL 2 MG TABS: 2 | 30 days supply | Qty: 45 | Fill #0

## 2019-02-02 DIAGNOSIS — Z0120 Encounter for dental examination and cleaning without abnormal findings: Secondary | ICD-10-CM | POA: Diagnosis not present

## 2019-02-02 DIAGNOSIS — M2623 Excessive horizontal overlap: Secondary | ICD-10-CM | POA: Diagnosis not present

## 2019-02-15 ENCOUNTER — Encounter: Payer: Self-pay | Admitting: Family Medicine

## 2019-02-17 ENCOUNTER — Encounter: Payer: Self-pay | Admitting: Emergency Medicine

## 2019-02-17 ENCOUNTER — Ambulatory Visit
Admission: EM | Admit: 2019-02-17 | Discharge: 2019-02-17 | Disposition: A | Discharge status: Home / Self Care | Payer: 59

## 2019-02-17 DIAGNOSIS — S76301A Unspecified injury of muscle, fascia and tendon of the posterior muscle group at thigh level, right thigh, initial encounter: Secondary | ICD-10-CM | POA: Diagnosis not present

## 2019-02-17 NOTE — Discharge Instructions (Addendum)
I believe that you have a small hamstring tear. You can use Ace wrap to wrap the leg Ice 3-4 times a day for 10 to 15 minutes at a time Tylenol for pain if needed Make sure you are resting and not doing any strenuous exercises or movements If your symptoms continue or worsen you will need to follow-up with orthopedics.

## 2019-02-17 NOTE — ED Triage Notes (Signed)
Pt presents to [facility] after she bent over two days ago, her shoe got caught, and she was unable to bend her knee, and she bent all the way over and felt a pop and a tug in the back of her leg.  Today patient notices she has bruising all down the back of the same leg, and she is now having increasing buttocks and hip pain.

## 2019-02-17 NOTE — ED Provider Notes (Addendum)
[name]    CSN: [id number] Arrival date & time: 02/17/19  1516     History   Chief Complaint Chief Complaint  Patient presents with  . Leg Pain    HPI Dorothy Wood is a 58 y.o. female.   Pt is a 58 year old female that presents with right leg pain. This started and has been persistent over the past 5 days.  Started after bending over and her shoe got caught.  She felt a pop in tug in the back of her leg.  She did not notice until today that there was bruising in the back of her leg.  She is having some tightness in the right buttocks area and pulling sensation.  Mild pain with walking.  No instability.  No numbness, tingling or fevers.  She has been taking 800 mg of ibuprofen for pain.  This somewhat helps.  Initially she put heat on the area but now she has been icing a few times a day. Reporting some instability near the ischial tuberosity. Sensation of something slipping in and out of place.   ROS per HPI      Past Medical History:  Diagnosis Date  . Cancer (HCC)    OVARIAN  . Colonic polyp   . Hyperlipidemia   . Hypertension   . Sjogren's disease ([place])   . Thyroid disease    HYPOTHYROIDISM    Patient Active Problem List   Diagnosis Date Noted  . Hormone replacement therapy (HRT) 09/09/2016  . Hypothyroidism 07/16/2014  . Sjogren's syndrome ([place]) 04/11/2013  . History of ovarian cancer 04/11/2013  . Personal history of colonic polyps 04/11/2013  . ADD (attention deficit disorder) 07/06/2012  . Dysthymia 07/06/2012    Past Surgical History:  Procedure Laterality Date  . ABDOMINAL HYSTERECTOMY     Ovarian cancer  . BREAST CYST ASPIRATION    . CESAREAN SECTION     x2  . COLONOSCOPY  10-06   Dr. Earnest Bailey  . TONSILLECTOMY      OB History   No obstetric history on file.      Home Medications    Prior to Admission medications   Medication Sig Start Date End Date Taking? Authorizing Provider  guanFACINE (TENEX) 2 MG tablet Take 2  mg by mouth at bedtime.   Yes [provider]  buPROPion (WELLBUTRIN XL) 300 MG 24 hr tablet Take 1 tablet (300 mg total) by mouth daily. 04/21/18   Denita Lung, MD  estradiol (ESTRACE) 1 MG tablet Take 1 mg by mouth daily.    [provider]  estrogens-methylTEST 1.25-2.5 MG TABS per tablet Take 1 tablet by mouth daily.    [provider]  hydrochlorothiazide (HYDRODIURIL) 25 MG tablet Take 1 tablet (25 mg total) by mouth daily. 06/17/18 09/15/18  Lelon Perla, MD  levothyroxine (SYNTHROID, LEVOTHROID) 75 MCG tablet Take 75 mcg by mouth daily before breakfast.    [provider]  losartan (COZAAR) 50 MG tablet Take 1 tablet (50 mg total) by mouth daily. 09/15/18 12/14/18  Lelon Perla, MD  methylphenidate (RITALIN) 20 MG tablet Take 1 tablet (20 mg total) by mouth 3 (three) times daily. 09/29/18   Denita Lung, MD  pilocarpine (SALAGEN) 5 MG tablet Take 1 tablet (5 mg total) by mouth 4 (four) times daily. 12/21/18   Denita Lung, MD    Family History Family History  Problem Relation Age of Onset  . Mental illness Mother   .  Hypertension Mother   . Heart failure Mother   . Mental illness Sister   . Mental illness Brother   . Mental illness Maternal Uncle   . Mental illness Maternal Grandmother   . Cancer Paternal Grandmother   . CAD Father        CABG at age 18    Social History Social History   Tobacco Use  . Smoking status: Never Smoker  . Smokeless tobacco: Never Used  Substance Use Topics  . Alcohol use: Yes    Comment: 6 glasses per week  . Drug use: No     Allergies   Patient has no known allergies.   Review of Systems Review of Systems   Physical Exam Triage Vital Signs ED Triage Vitals [02/17/19 1528]  Enc Vitals Group     BP 124/69     Pulse Rate 71     Resp 18     Temp 98 F (36.7 C)     Temp Source Oral     SpO2 96 %     Weight      Height      Head Circumference      Peak Flow      Pain Score 8      Pain Loc      Pain Edu?      Excl. in [place]?    No data found.  Updated Vital Signs BP 124/69 (BP Location: Right Arm)   Pulse 71   Temp 98 F (36.7 C) (Oral)   Resp 18   SpO2 96%   Visual Acuity Right Eye Distance:   Left Eye Distance:   Bilateral Distance:    Right Eye Near:   Left Eye Near:    Bilateral Near:     Physical Exam Musculoskeletal: Normal range of motion.        General: Swelling, tenderness and signs of injury present.     Right upper leg: She exhibits tenderness and swelling.       Legs:     Comments: Bruising with different stages of healing to the right posterior upper leg.  Tenderness and swelling present.  Good range of motion of leg and hip. No muscle defects or atrophy. No bruising to hip or buttocks.  Some pain at the ischial tuberosity.       UC Treatments / Results  Labs (all labs ordered are listed, but only abnormal results are displayed) Labs Reviewed - No data to display  EKG None  Radiology No results found.  Procedures Procedures (including critical care time)  Medications Ordered in UC Medications - No data to display  Initial Impression / Assessment and Plan / UC Course  I have reviewed the triage vital signs and the nursing notes.  Pertinent labs & imaging results that were available during my care of the patient were reviewed by me and considered in my medical decision making (see chart for details).     Most likely proximal hamstring injury based on symptoms and presentation. Pain at the ischial tuberosity. No muscle defects or atrophy.  Pt able to ambulate.  Instructed to wrap the leg with ace wrap, ice, rest.  If symptoms continue or worsen she will need to see ortho.     Final Clinical Impressions(s) / UC Diagnoses   Final diagnoses:  Hamstring injury, right, initial encounter     Discharge Instructions     I believe that you have a small hamstring tear. You can use Ace wrap  to wrap the leg Ice 3-4 times  a day for 10 to 15 minutes at a time Tylenol for pain if needed Make sure you are resting and not doing any strenuous exercises or movements If your symptoms continue or worsen you will need to follow-up with orthopedics.    ED Prescriptions    None     Controlled Substance Prescriptions  Controlled Substance Registry consulted? Not Applicable        Orvan July, NP 02/17/19 1707

## 2019-02-17 NOTE — ED Notes (Signed)
Patient able to ambulate independently  

## 2019-03-01 ENCOUNTER — Encounter: Payer: Self-pay | Admitting: Family Medicine

## 2019-03-01 ENCOUNTER — Ambulatory Visit (INDEPENDENT_AMBULATORY_CARE_PROVIDER_SITE_OTHER): Payer: 59 | Admitting: Family Medicine

## 2019-03-01 ENCOUNTER — Other Ambulatory Visit: Payer: Self-pay

## 2019-03-01 VITALS — BP 110/72 | HR 70 | Temp 98.0°F | Wt 131.8 lb

## 2019-03-01 DIAGNOSIS — S76311A Strain of muscle, fascia and tendon of the posterior muscle group at thigh level, right thigh, initial encounter: Secondary | ICD-10-CM | POA: Diagnosis not present

## 2019-03-01 NOTE — Patient Instructions (Signed)
Heat for 20 minutes 3 times per day and gentle stretching after that.  You can use Tylenol for pain relief.  Listen to your body in terms of returning to physical activity.  First he have to walk without pain then you can go half speed and then faster.

## 2019-03-01 NOTE — Progress Notes (Signed)
   Subjective:    Patient ID: Dorothy Wood, female    DOB: 1961-03-30, 58 y.o.   MRN: [id number]  HPI Approximately 3 weeks ago she hyperflexed her right hip and experienced pain in the posterior thigh area.  She was seen in an urgent care center and given a wrap for it.  She had been using ice on this since then.  She does state that it is doing much better but not totally healed.  She did have a lot of ecchymosis at the time.   Review of Systems     Objective:   Physical Exam Exam of the right posterior thigh shows no visible changes.  Slight tenderness palpation over the midportion of the medial biceps femoris.  No tenderness over the initial tuberosity or distally.      Assessment & Plan:   Encounter Diagnosis  Name Primary?  . Hamstring strain, right, initial encounter Yes  I will have her switch to heat for 20 minutes 3 times per day with gentle stretching after that presently she is walking without difficulty.  We will have her continue to do that and hopefully this will slowly go away.  She was comfortable with that.

## 2019-03-02 ENCOUNTER — Encounter: Payer: Self-pay | Admitting: Family Medicine

## 2019-03-02 MED FILL — guanFACINE HCL 2 MG TABS: 2 | 30 days supply | Qty: 60 | Fill #0

## 2019-03-07 ENCOUNTER — Other Ambulatory Visit: Payer: Self-pay

## 2019-03-07 ENCOUNTER — Ambulatory Visit: Payer: 59 | Admitting: Family Medicine

## 2019-03-07 VITALS — BP 118/72 | HR 74 | Temp 98.3°F | Wt 133.6 lb

## 2019-03-07 DIAGNOSIS — S76311A Strain of muscle, fascia and tendon of the posterior muscle group at thigh level, right thigh, initial encounter: Secondary | ICD-10-CM

## 2019-03-07 DIAGNOSIS — Z0120 Encounter for dental examination and cleaning without abnormal findings: Secondary | ICD-10-CM | POA: Diagnosis not present

## 2019-03-07 DIAGNOSIS — M2623 Excessive horizontal overlap: Secondary | ICD-10-CM | POA: Diagnosis not present

## 2019-03-07 NOTE — Progress Notes (Signed)
   Subjective:    Patient ID:  Cellar, female    DOB: 10/01/1960, 58 y.o.   MRN: [id number]  HPI Last week after she left the office she slipped and hyperextended her right leg again causing pain in the upper gluteal area.  She is having much less difficulty with it today but is still does interfere slightly with walking.  Review of Systems     Objective:   Physical Exam Alert and in no distress.  Slight tenderness palpation in the lateral gluteal area of the gluteus medius and minimus.  Good hip motion.  No tenderness over SI joint.  No ecchymosis noted.       Assessment & Plan:   Encounter Diagnosis  Name Primary?  . Hamstring strain, right, initial encounter Yes  To a certain extent is a reinjury of the previous problem but it is in a slightly different area.  Explained that this is mostly musculoskeletal in nature and should heal up on its own.  Explained that if this does not continue to improve we can send her for ultrasound to look more definitively at what is going on and she was comfortable with waiting.

## 2019-03-09 MED FILL — LEVOTHYROXINE 88 MCG TABLET: 88 | 90 days supply | Qty: 90 | Fill #3

## 2019-03-19 MED FILL — buPROPion HCL ER (XL) 300 M: 300 | 30 days supply | Qty: 30 | Fill #0

## 2019-03-27 MED FILL — HYDROCHLOROTHIAZIDE 25 MG T: 25 | 90 days supply | Qty: 90 | Fill #3

## 2019-04-04 DIAGNOSIS — Z0120 Encounter for dental examination and cleaning without abnormal findings: Secondary | ICD-10-CM | POA: Diagnosis not present

## 2019-04-04 DIAGNOSIS — M2623 Excessive horizontal overlap: Secondary | ICD-10-CM | POA: Diagnosis not present

## 2019-04-06 MED FILL — PILOCARPINE HCL 5 MG TABLET: 5 | 90 days supply | Qty: 360 | Fill #1

## 2019-04-06 MED FILL — ESTROGEN-METHYLTESTOSTERONE: 1.25-2.5 | 90 days supply | Qty: 90 | Fill #0

## 2019-04-06 MED FILL — guanFACINE HCL 2 MG TABS: 2 | 30 days supply | Qty: 60 | Fill #1

## 2019-04-07 ENCOUNTER — Encounter: Payer: Self-pay | Admitting: Family Medicine

## 2019-04-07 DIAGNOSIS — H524 Presbyopia: Secondary | ICD-10-CM | POA: Diagnosis not present

## 2019-04-26 MED FILL — ESTRADIOL 1 MG TABS: 1 | 90 days supply | Qty: 90 | Fill #1

## 2019-04-27 MED FILL — METHYLPHENIDATE 20 MG TAB: 20 | 30 days supply | Qty: 90 | Fill #0

## 2019-05-04 ENCOUNTER — Telehealth: Payer: Self-pay | Admitting: Family Medicine

## 2019-05-04 NOTE — Telephone Encounter (Signed)
Pt called and said she will be going to get tested for Covid today or tomorrow and when she called the site they told her her results will come back faster if she has a Drs orders.

## 2019-05-04 NOTE — Telephone Encounter (Signed)
Disregard previous message. Pt called back and said her information wasn't accurate

## 2019-05-05 DIAGNOSIS — Z20828 Contact with and (suspected) exposure to other viral communicable diseases: Secondary | ICD-10-CM | POA: Diagnosis not present

## 2019-05-05 MED FILL — guanFACINE HCL 2 MG TABS: 2 | 30 days supply | Qty: 45 | Fill #1

## 2019-05-16 DIAGNOSIS — M2623 Excessive horizontal overlap: Secondary | ICD-10-CM | POA: Diagnosis not present

## 2019-05-16 DIAGNOSIS — Z0120 Encounter for dental examination and cleaning without abnormal findings: Secondary | ICD-10-CM | POA: Diagnosis not present

## 2019-05-23 MED FILL — buPROPion HCL ER (XL) 300 M: 300 | 30 days supply | Qty: 30 | Fill #1

## 2019-05-26 MED FILL — METHYLPHENIDATE HCL 20 MG T: 20 | 30 days supply | Qty: 90 | Fill #0

## 2019-05-30 DIAGNOSIS — Z01419 Encounter for gynecological examination (general) (routine) without abnormal findings: Secondary | ICD-10-CM | POA: Diagnosis not present

## 2019-05-30 DIAGNOSIS — E039 Hypothyroidism, unspecified: Secondary | ICD-10-CM | POA: Diagnosis not present

## 2019-05-30 LAB — TSH: TSH: 2.36 (ref <=5.90)

## 2019-06-01 ENCOUNTER — Encounter: Payer: Self-pay | Admitting: Family Medicine

## 2019-06-06 MED FILL — guanFACINE HCL 2 MG TABS: 2 | 30 days supply | Qty: 45 | Fill #2

## 2019-06-10 MED FILL — LEVOTHYROXINE 88 MCG TABLET: 88 | 90 days supply | Qty: 90 | Fill #0

## 2019-06-22 MED FILL — buPROPion HCL ER (XL) 300 M: 300 | 30 days supply | Qty: 30 | Fill #2

## 2019-06-24 MED FILL — METHYLPHENIDATE HCL 20 MG T: 20 | 30 days supply | Qty: 90 | Fill #0

## 2019-06-28 MED FILL — PILOCARPINE HCL 5 MG TABLET: 5 | 90 days supply | Qty: 360 | Fill #2

## 2019-07-03 ENCOUNTER — Other Ambulatory Visit: Payer: Self-pay | Admitting: Cardiology

## 2019-07-03 DIAGNOSIS — I10 Essential (primary) hypertension: Secondary | ICD-10-CM

## 2019-07-03 MED FILL — ESTROGEN-METHYLTESTOSTERONE: 1.25-2.5 | 10 days supply | Qty: 10 | Fill #1

## 2019-07-04 MED FILL — HYDROCHLOROTHIAZIDE 25 MG T: 25 | 90 days supply | Qty: 90 | Fill #0

## 2019-07-05 MED FILL — ESTROGEN-METHYLTESTOSTERONE: 1.25-2.5 | 90 days supply | Qty: 90 | Fill #0

## 2019-07-13 DIAGNOSIS — F902 Attention-deficit hyperactivity disorder, combined type: Secondary | ICD-10-CM | POA: Diagnosis not present

## 2019-07-13 DIAGNOSIS — F419 Anxiety disorder, unspecified: Secondary | ICD-10-CM | POA: Diagnosis not present

## 2019-07-13 MED FILL — guanFACINE HCL 2 MG TABS: 2 | 30 days supply | Qty: 60 | Fill #0

## 2019-07-16 MED FILL — buPROPion HCL ER (XL) 300 M: 300 | 30 days supply | Qty: 30 | Fill #0

## 2019-07-19 MED FILL — ESTRADIOL 1 MG TABS: 1 | 90 days supply | Qty: 90 | Fill #2

## 2019-07-26 ENCOUNTER — Other Ambulatory Visit: Payer: Self-pay | Admitting: Obstetrics & Gynecology

## 2019-07-26 DIAGNOSIS — Z1231 Encounter for screening mammogram for malignant neoplasm of breast: Secondary | ICD-10-CM

## 2019-08-07 MED FILL — guanFACINE HCL 2 MG TABS: 2 | 30 days supply | Qty: 60 | Fill #1

## 2019-08-09 MED FILL — BUPROPION HCL ER (XL) 300 M: 300 | 30 days supply | Qty: 30 | Fill #1

## 2019-08-16 ENCOUNTER — Encounter: Payer: Self-pay | Admitting: Family Medicine

## 2019-08-16 DIAGNOSIS — Z8619 Personal history of other infectious and parasitic diseases: Secondary | ICD-10-CM

## 2019-08-16 NOTE — Telephone Encounter (Signed)
Patient states that some people with dengue fever history have developed antibodies to Covid.  She would like this checked out.

## 2019-08-17 ENCOUNTER — Other Ambulatory Visit: Payer: Self-pay

## 2019-08-17 ENCOUNTER — Other Ambulatory Visit: Payer: 59

## 2019-08-17 DIAGNOSIS — Z8619 Personal history of other infectious and parasitic diseases: Secondary | ICD-10-CM | POA: Diagnosis not present

## 2019-08-18 LAB — EUROIMMUN SARS-COV-2 AB, IGG: Euroimmun SARS-CoV-2 Ab, IgG: NEGATIVE

## 2019-08-18 LAB — SAR COV2 SEROLOGY (COVID19)AB(IGG),IA

## 2019-08-29 MED FILL — LEVOTHYROXINE 88 MCG TABLET: 88 | 90 days supply | Qty: 90 | Fill #1

## 2019-08-30 ENCOUNTER — Ambulatory Visit: Payer: 59 | Attending: Internal Medicine

## 2019-08-30 ENCOUNTER — Other Ambulatory Visit: Payer: Self-pay

## 2019-08-30 DIAGNOSIS — Z20828 Contact with and (suspected) exposure to other viral communicable diseases: Secondary | ICD-10-CM | POA: Diagnosis not present

## 2019-08-30 DIAGNOSIS — Z20822 Contact with and (suspected) exposure to covid-19: Secondary | ICD-10-CM

## 2019-09-01 DIAGNOSIS — Z20828 Contact with and (suspected) exposure to other viral communicable diseases: Secondary | ICD-10-CM | POA: Diagnosis not present

## 2019-09-01 DIAGNOSIS — Z03818 Encounter for observation for suspected exposure to other biological agents ruled out: Secondary | ICD-10-CM | POA: Diagnosis not present

## 2019-09-02 LAB — NOVEL CORONAVIRUS, NAA: SARS-CoV-2, NAA: NOT DETECTED

## 2019-09-05 MED FILL — guanFACINE HCL 2 MG TABS: 2 | 30 days supply | Qty: 60 | Fill #2

## 2019-09-11 MED FILL — BUPROPION HCL ER (XL) 300 M: 300 | 30 days supply | Qty: 30 | Fill #2

## 2019-09-18 ENCOUNTER — Other Ambulatory Visit: Payer: Self-pay

## 2019-09-18 ENCOUNTER — Ambulatory Visit
Admission: RE | Admit: 2019-09-18 | Discharge: 2019-09-18 | Disposition: A | Discharge status: Home / Self Care | Payer: 59 | Source: Ambulatory Visit | Attending: Obstetrics & Gynecology | Admitting: Obstetrics & Gynecology

## 2019-09-18 DIAGNOSIS — Z1231 Encounter for screening mammogram for malignant neoplasm of breast: Secondary | ICD-10-CM

## 2019-09-18 LAB — HM MAMMOGRAPHY

## 2019-09-18 IMAGING — MG DIGITAL SCREENING BILAT W/ TOMO W/ CAD
8 series · 9 of 24 positions shown · non-contrast
Comparison: Previous exam(s).

CLINICAL DATA: Screening.

EXAM:
DIGITAL SCREENING BILATERAL MAMMOGRAM WITH TOMO AND CAD

[L CC synth-2D]
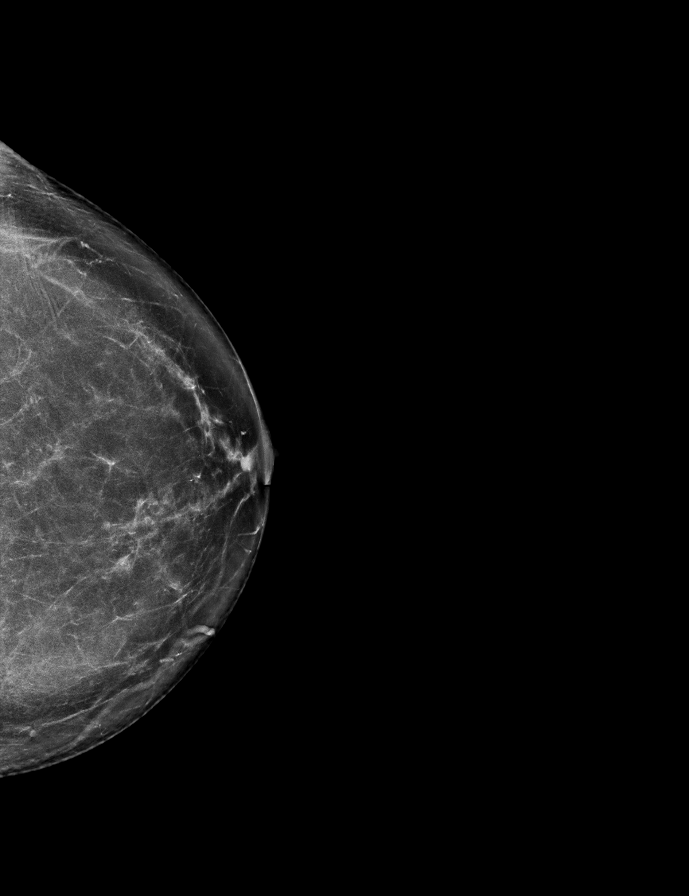

[R MLO synth-2D]
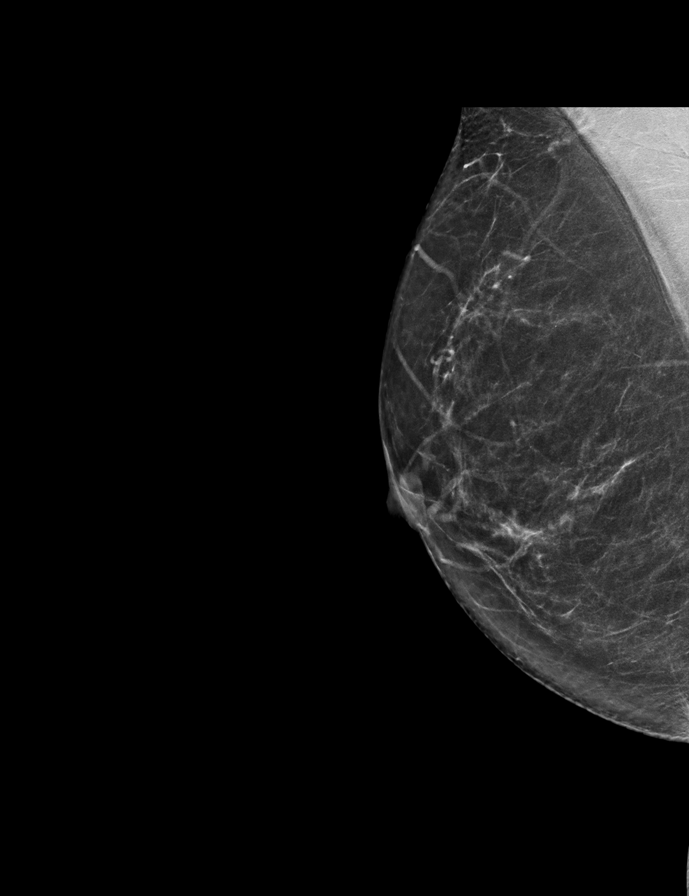

[L MLO synth-2D]
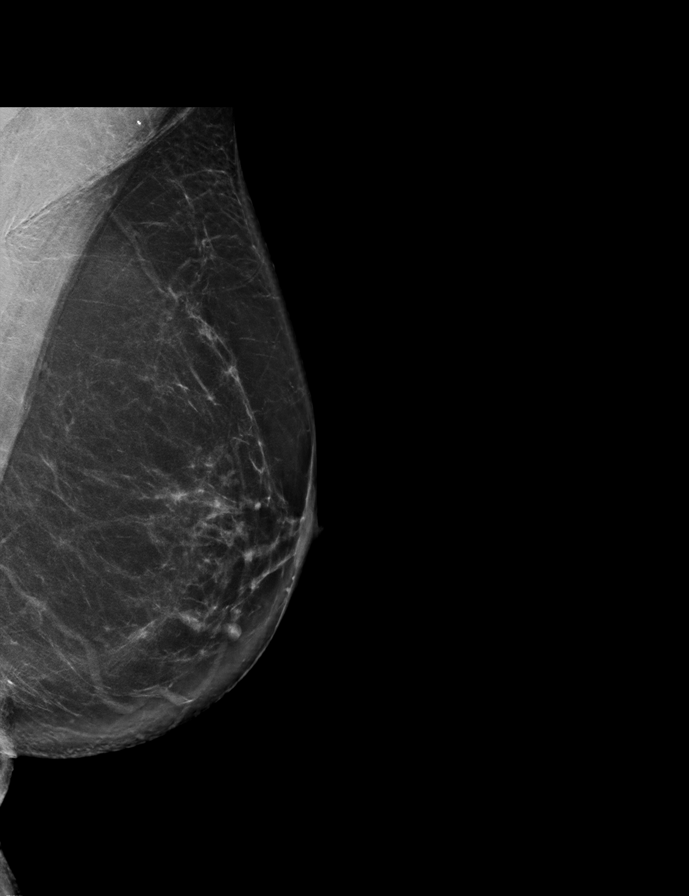

[R CC synth-2D]
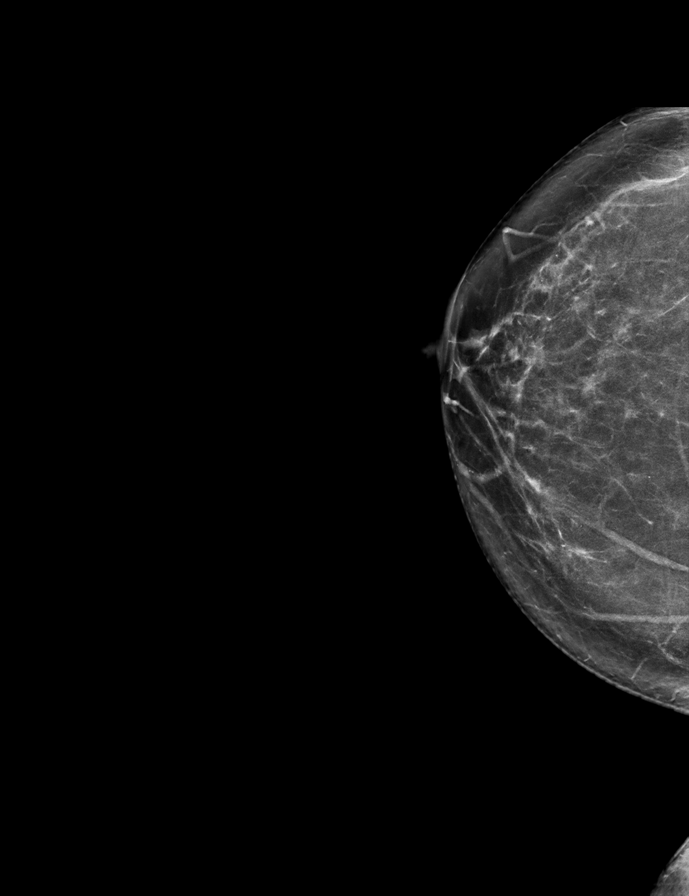

[R CC tomo · 2 of 73 frames shown]
[frame 24/73]
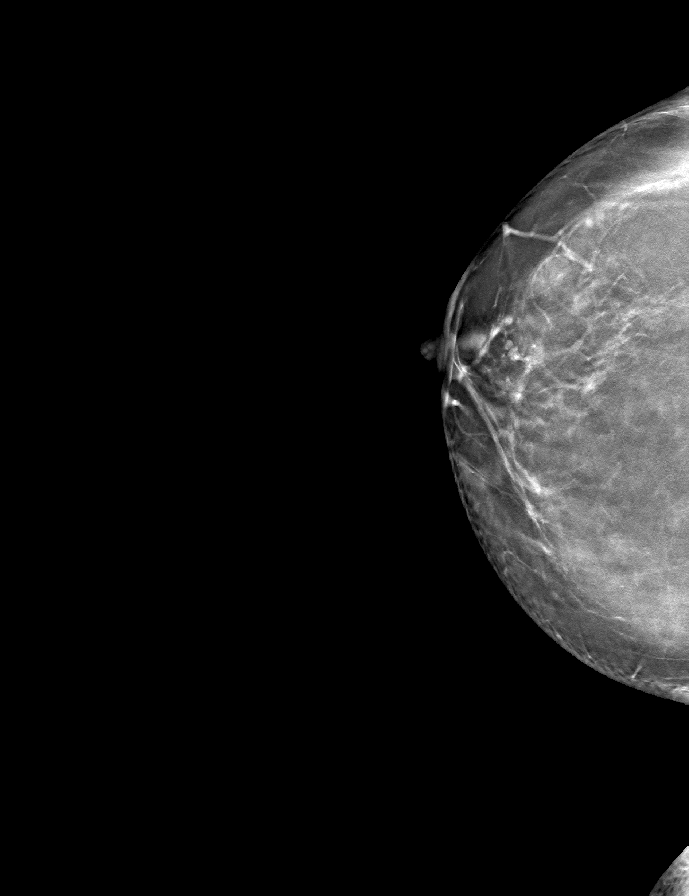
[frame 37/73]
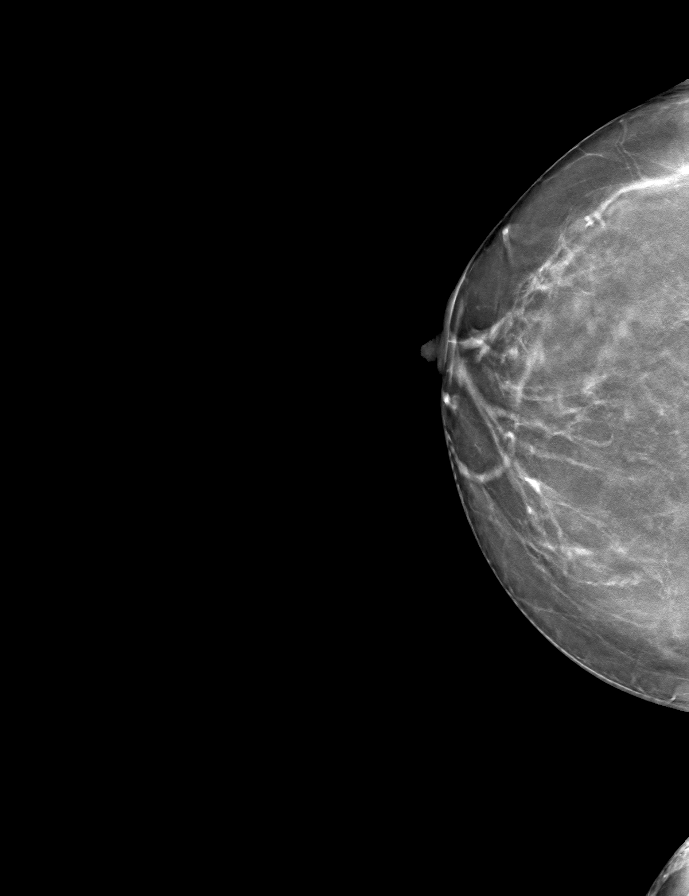

[L CC tomo · tomo slice 38/75.0]
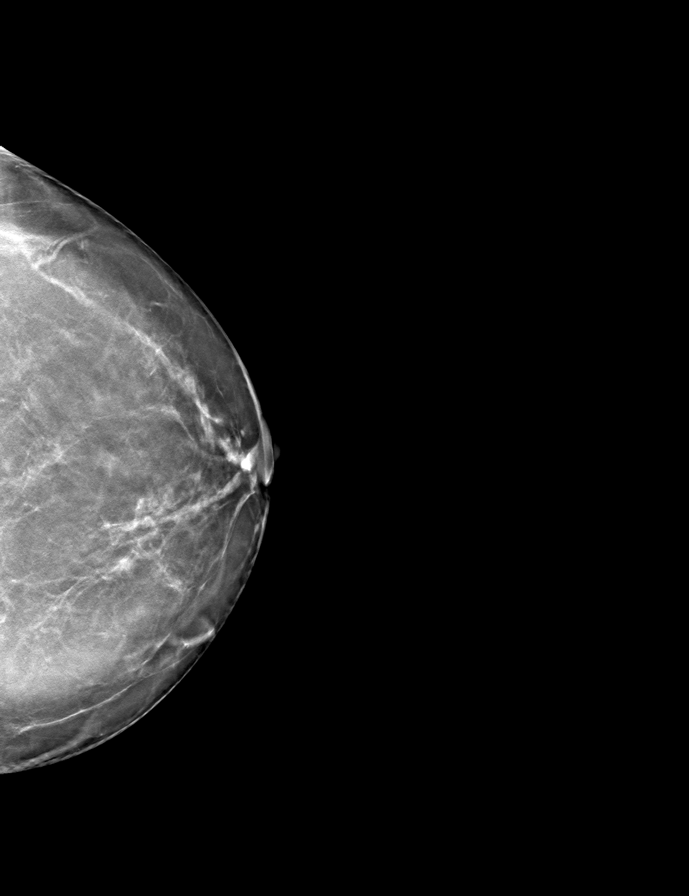

[R MLO tomo · tomo slice 35/69.0]
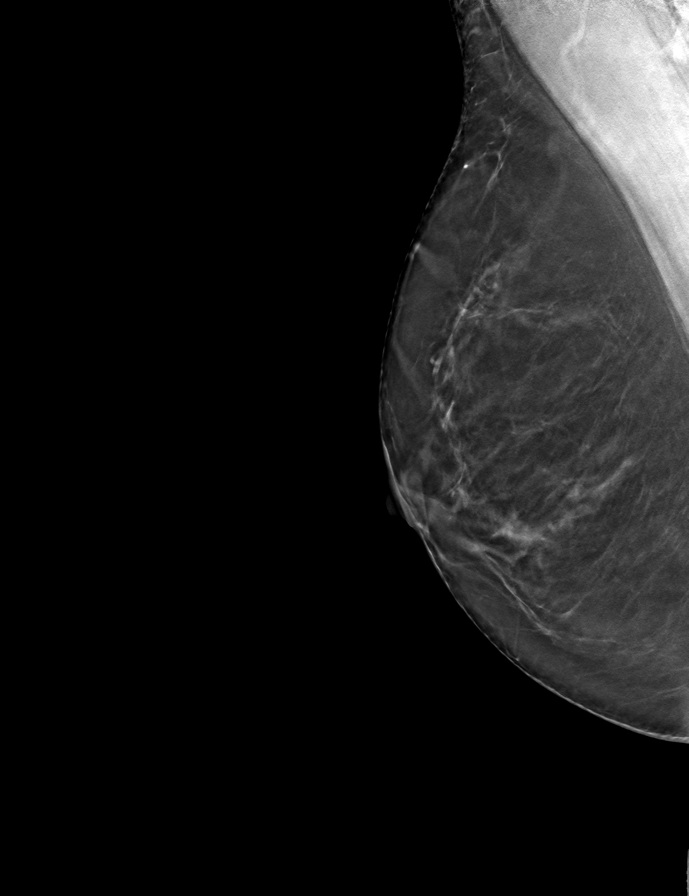

[L MLO tomo · tomo slice 40/79.0]
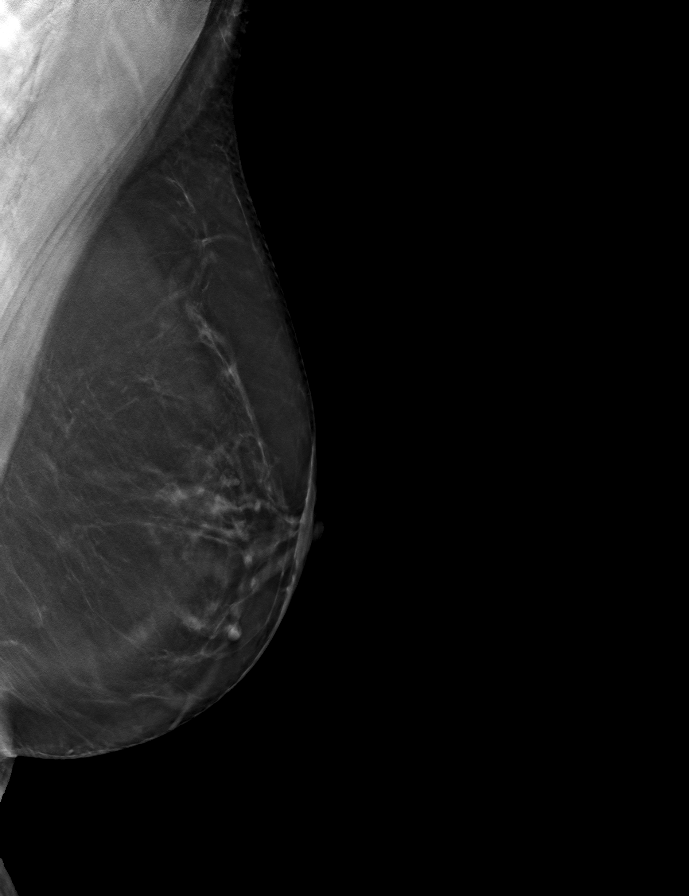

[9 of 24 positions shown; findings below may reference images not displayed]

ACR Breast Density Category b: There are scattered areas of
fibroglandular density.
FINDINGS: There are no findings suspicious for malignancy. Images were
processed with CAD.
IMPRESSION: No mammographic evidence of malignancy. A result letter of this
screening mammogram will be mailed directly to the patient.

RECOMMENDATION:
Screening mammogram in one year. (Code:[TQ])

BI-RADS CATEGORY  1: Negative.

## 2019-09-26 MED FILL — HYDROCHLOROTHIAZIDE 25 MG T: 25 | 90 days supply | Qty: 90 | Fill #1

## 2019-09-26 MED FILL — PILOCARPINE HCL 5 MG TABLET: 5 | 90 days supply | Qty: 360 | Fill #3

## 2019-10-02 MED FILL — ESTROGEN-METHYLTESTOSTERONE: 1.25-2.5 | 90 days supply | Qty: 90 | Fill #1

## 2019-10-03 NOTE — Progress Notes (Signed)
HPI:FU hypertension and family h/o CAD.  Echocardiogram October 2019 showed normal LV function.  Since last seen, there is no dyspnea, chest pain, palpitations or syncope.  Current Outpatient Medications  Medication Sig Dispense Refill  . buPROPion (WELLBUTRIN XL) 300 MG 24 hr tablet Take 1 tablet (300 mg total) by mouth daily. 90 tablet 3  . estradiol (ESTRACE) 1 MG tablet Take 1 mg by mouth daily.    Marland Kitchen estrogens-methylTEST 1.25-2.5 MG TABS per tablet Take 1 tablet by mouth daily.    Marland Kitchen guanFACINE (TENEX) 2 MG tablet Take 2 mg by mouth at bedtime.    . hydrochlorothiazide (HYDRODIURIL) 25 MG tablet TAKE 1 TABLET (25 MG TOTAL) BY MOUTH DAILY. 90 tablet 1  . levothyroxine (SYNTHROID, LEVOTHROID) 75 MCG tablet Take 75 mcg by mouth daily before breakfast.    . losartan (COZAAR) 50 MG tablet Take 1 tablet (50 mg total) by mouth daily. (Patient not taking: Reported on 03/01/2019) 90 tablet 3  . methylphenidate (RITALIN) 20 MG tablet Take 1 tablet (20 mg total) by mouth 3 (three) times daily. 270 tablet 0  . pilocarpine (SALAGEN) 5 MG tablet Take 1 tablet (5 mg total) by mouth 4 (four) times daily. 360 tablet 3   No current facility-administered medications for this visit.     Past Medical History:  Diagnosis Date  . Cancer (HCC)    OVARIAN  . Colonic polyp   . Hyperlipidemia   . Hypertension   . Sjogren's disease ([place])   . Thyroid disease    HYPOTHYROIDISM    Past Surgical History:  Procedure Laterality Date  . ABDOMINAL HYSTERECTOMY     Ovarian cancer  . BREAST CYST ASPIRATION    . CESAREAN SECTION     x2  . COLONOSCOPY  10-06   Dr. Earnest Bailey  . TONSILLECTOMY      Social History   Socioeconomic History  . Marital status: Married    Spouse name: Not on file  . Number of children: 2  . Years of education: Not on file  . Highest education level: Not on file  Occupational History  . Not on file  Tobacco Use  . Smoking status: Never Smoker  . Smokeless tobacco:  Never Used  Substance and Sexual Activity  . Alcohol use: Yes    Comment: 6 glasses per week  . Drug use: No  . Sexual activity: Not Currently  Other Topics Concern  . Not on file  Social History Narrative  . Not on file   Social Determinants of Health   Financial Resource Strain:   . Difficulty of Paying Living Expenses: Not on file  Food Insecurity:   . Worried About Charity fundraiser in the Last Year: Not on file  . Ran Out of Food in the Last Year: Not on file  Transportation Needs:   . Lack of Transportation (Medical): Not on file  . Lack of Transportation (Non-Medical): Not on file  Physical Activity:   . Days of Exercise per Week: Not on file  . Minutes of Exercise per Session: Not on file  Stress:   . Feeling of Stress : Not on file  Social Connections:   . Frequency of Communication with Friends and Family: Not on file  . Frequency of Social Gatherings with Friends and Family: Not on file  . Attends Religious Services: Not on file  . Active Member of Clubs or Organizations: Not on file  . Attends Archivist  Meetings: Not on file  . Marital Status: Not on file  Intimate Partner Violence:   . Fear of Current or Ex-Partner: Not on file  . Emotionally Abused: Not on file  . Physically Abused: Not on file  . Sexually Abused: Not on file    Family History  Problem Relation Age of Onset  . Mental illness Mother   . Hypertension Mother   . Heart failure Mother   . Mental illness Sister   . Mental illness Brother   . Mental illness Maternal Uncle   . Mental illness Maternal Grandmother   . Cancer Paternal Grandmother   . CAD Father        CABG at age 8    ROS: no fevers or chills, productive cough, hemoptysis, dysphasia, odynophagia, melena, hematochezia, dysuria, hematuria, rash, seizure activity, orthopnea, PND, pedal edema, claudication. Remaining systems are negative.  Physical Exam: Well-developed well-nourished in no acute distress.  Skin  is warm and dry.  HEENT is normal.  Neck is supple.  Chest is clear to auscultation with normal expansion.  Cardiovascular exam is regular rate and rhythm.  Abdominal exam nontender or distended. No masses palpated. Extremities show no edema. neuro grossly intact  ECG-sinus rhythm at a rate of 62, left axis deviation, RV conduction delay.  Personally reviewed  A/P  1 family history of coronary artery disease-patient has not had chest pain.  I will arrange a calcium score for risk stratification.  2 hypertension-blood pressure controlled.  Continue present medications and follow.  Check potassium and renal function.  3 hyperlipidemia-continue diet.  Check lipids.  If significant coronary calcification noted will need statin.    Kirk Ruths, MD

## 2019-10-05 ENCOUNTER — Encounter: Payer: Self-pay | Admitting: Cardiology

## 2019-10-05 ENCOUNTER — Ambulatory Visit (INDEPENDENT_AMBULATORY_CARE_PROVIDER_SITE_OTHER): Payer: 59 | Admitting: Cardiology

## 2019-10-05 ENCOUNTER — Other Ambulatory Visit: Payer: Self-pay

## 2019-10-05 VITALS — BP 134/82 | HR 62 | Ht 65.0 in | Wt 131.6 lb

## 2019-10-05 DIAGNOSIS — E7800 Pure hypercholesterolemia, unspecified: Secondary | ICD-10-CM | POA: Diagnosis not present

## 2019-10-05 DIAGNOSIS — I10 Essential (primary) hypertension: Secondary | ICD-10-CM

## 2019-10-05 MED FILL — guanFACINE HCL 2 MG TABS: 2 | 30 days supply | Qty: 60 | Fill #3

## 2019-10-05 NOTE — Patient Instructions (Signed)
Medication Instructions:  NO CHANGES *If you need a refill on your cardiac medications before your next appointment, please call your pharmacy*  Lab Work: FASTING LIPID AND BMET- DO NOT EAT OR [place] If you have labs (blood work) drawn today and your tests are completely normal, you will receive your results only by: Marland Kitchen MyChart Message (if you have MyChart) OR . A paper copy in the mail If you have any lab test that is abnormal or we need to change your treatment, we will call you to review the results.  Testing/Procedures: CALCIUM SCORE CT [place]  Follow-Up: At [facility], you and your health needs are our priority.  As part of our continuing mission to provide you with exceptional heart care, we have created designated Provider Care Teams.  These Care Teams include your primary Cardiologist (physician) and Advanced Practice Providers (APPs -  Physician Assistants and Nurse Practitioners) who all work together to provide you with the care you need, when you need it.  Your next appointment:   12 month(s)  The format for your next appointment:   In Person  Provider:   Kirk Ruths, MD

## 2019-10-09 MED FILL — BUPROPION HCL XL 300 MG TAB: 300 | 30 days supply | Qty: 30 | Fill #3

## 2019-10-17 ENCOUNTER — Encounter: Payer: Self-pay | Admitting: Family Medicine

## 2019-10-17 ENCOUNTER — Other Ambulatory Visit: Payer: Self-pay

## 2019-10-17 ENCOUNTER — Ambulatory Visit: Payer: 59 | Admitting: Family Medicine

## 2019-10-17 VITALS — BP 120/74 | HR 85 | Temp 97.5°F | Wt 130.8 lb

## 2019-10-17 DIAGNOSIS — M25551 Pain in right hip: Secondary | ICD-10-CM

## 2019-10-17 MED FILL — ESTRADIOL 1 MG TABS: 1 | 90 days supply | Qty: 90 | Fill #3

## 2019-10-17 NOTE — Progress Notes (Signed)
   Subjective:    Patient ID: Dorothy Wood, female    DOB: 11-Dec-1960, 59 y.o.   MRN: [id number]  HPI She complains of a 6-week history of right gluteal pain.  She did change jobs and is doing a lot more sitting but is not exercising any more or less than normal.  No complaint of mid back pain but does state that the pain does radiate down the posterior aspect of her leg.  No numbness, tingling or weakness.   Review of Systems     Objective:   Physical Exam Alert and in no distress.  No tenderness over lumbar or SI spine area.  Pain on palpation and fullness noted in the gluteus medius area.  Sciatic notch area was nontender.  Full motion of the hip.  Negative straight leg raising, normal DTRs and strength.       Assessment & Plan:  Right hip pain - Plan: DG Lumbar Spine Complete, Ambulatory referral to Orthopedic Surgery I will get an x-ray to assure no bony abnormality.  The pain seems to be in the gluteus medius area I will therefore refer for probable ultrasound of that area.  No good reason identified for having pain in that area.

## 2019-10-18 ENCOUNTER — Ambulatory Visit
Admission: RE | Admit: 2019-10-18 | Discharge: 2019-10-18 | Disposition: A | Discharge status: Home / Self Care | Payer: 59 | Source: Ambulatory Visit | Attending: Family Medicine | Admitting: Family Medicine

## 2019-10-18 ENCOUNTER — Ambulatory Visit: Payer: 59 | Admitting: Family Medicine

## 2019-10-18 ENCOUNTER — Encounter: Payer: Self-pay | Admitting: Family Medicine

## 2019-10-18 ENCOUNTER — Ambulatory Visit: Payer: Self-pay

## 2019-10-18 DIAGNOSIS — M25551 Pain in right hip: Secondary | ICD-10-CM

## 2019-10-18 DIAGNOSIS — M4727 Other spondylosis with radiculopathy, lumbosacral region: Secondary | ICD-10-CM | POA: Diagnosis not present

## 2019-10-18 IMAGING — CR DG LUMBAR SPINE COMPLETE 4+V
5 series · 5 of 5 positions shown · non-contrast
Comparison: None.

CLINICAL DATA: Bilateral hip and buttock pain right greater than
left, lower extremity radiculopathy for 6 weeks

EXAM:
LUMBAR SPINE - COMPLETE 4+ VIEW

[t lumbar spine ap]
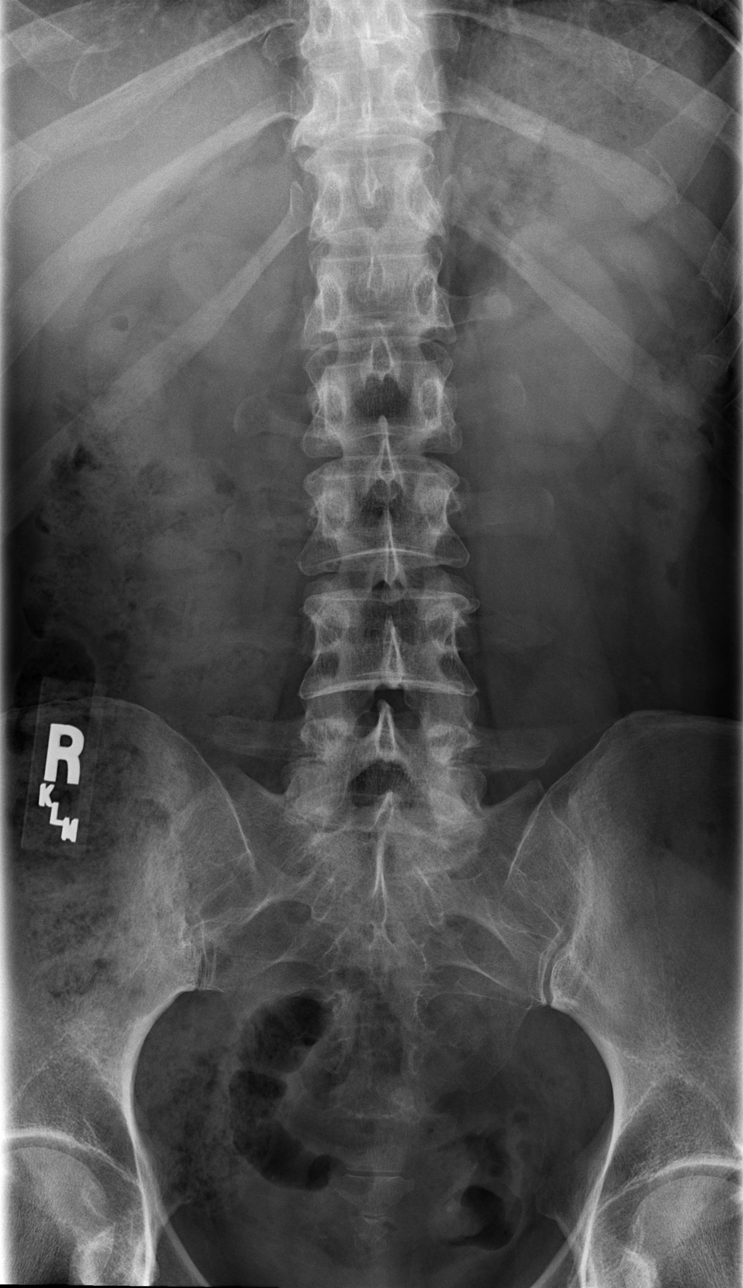

[t lumbar spine obl (1 of 2)]
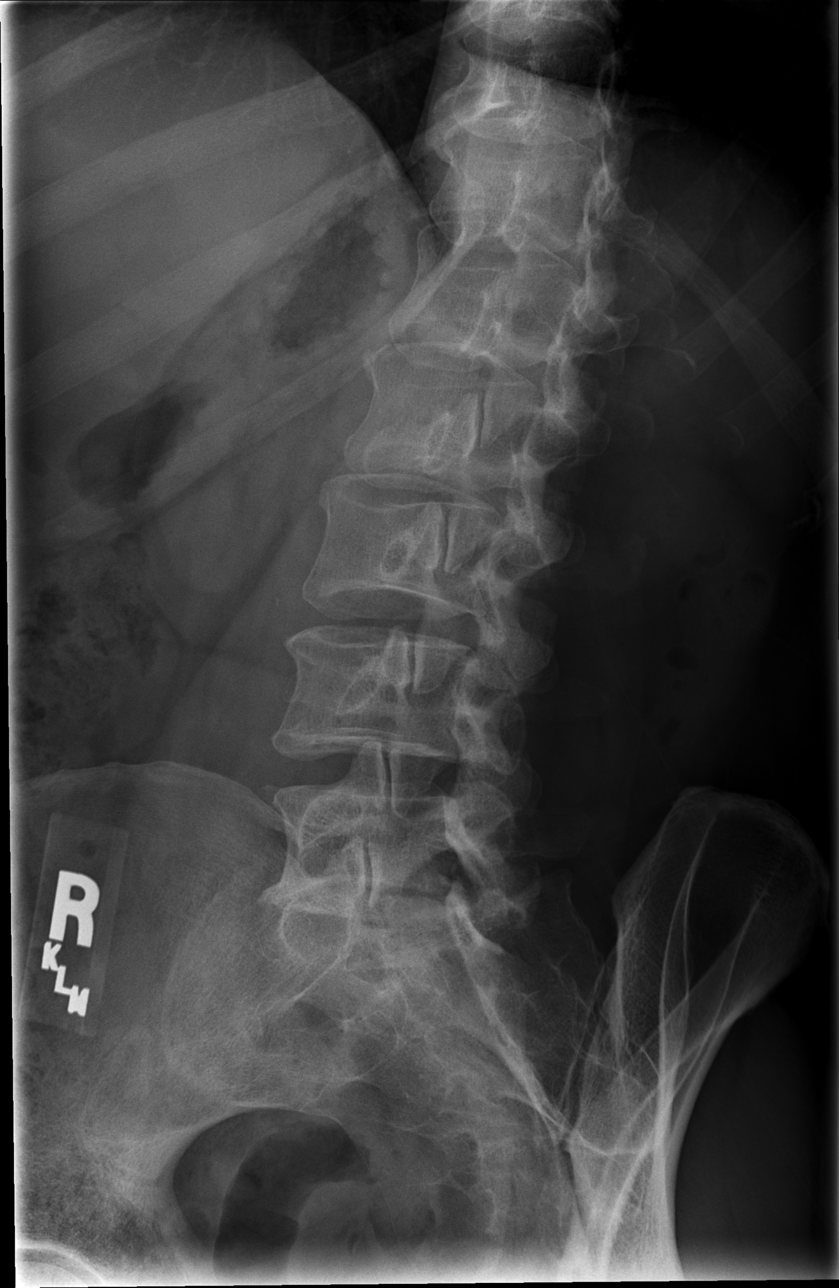

[t lumbar spine obl (2 of 2)]
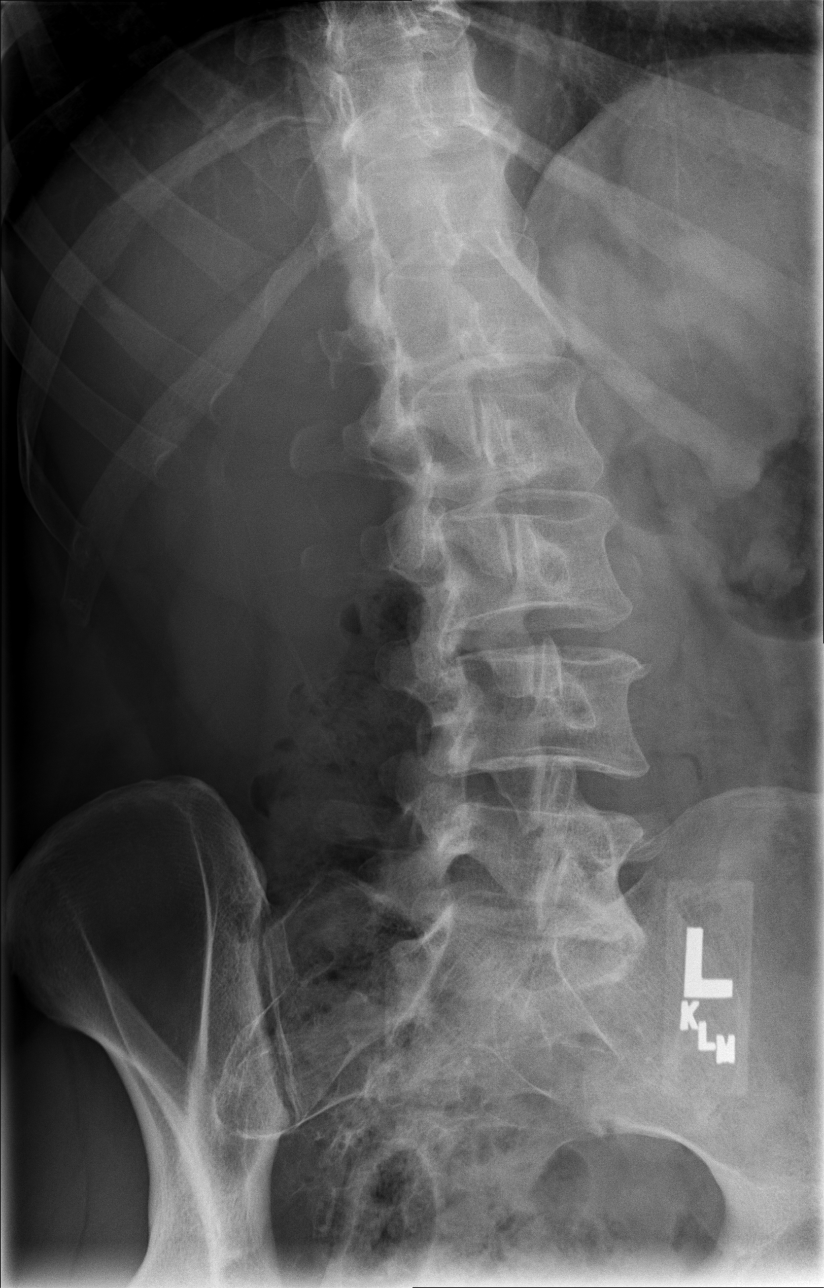

[t lumbar spine lat]
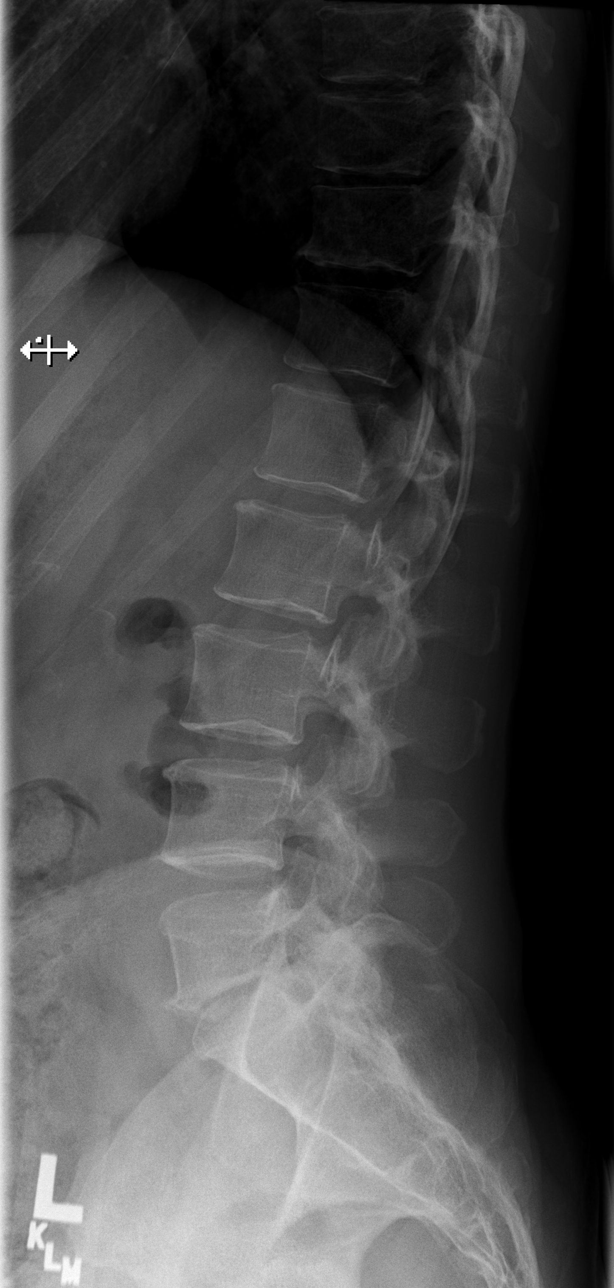

[t lumbar l-5 s-1 spot]
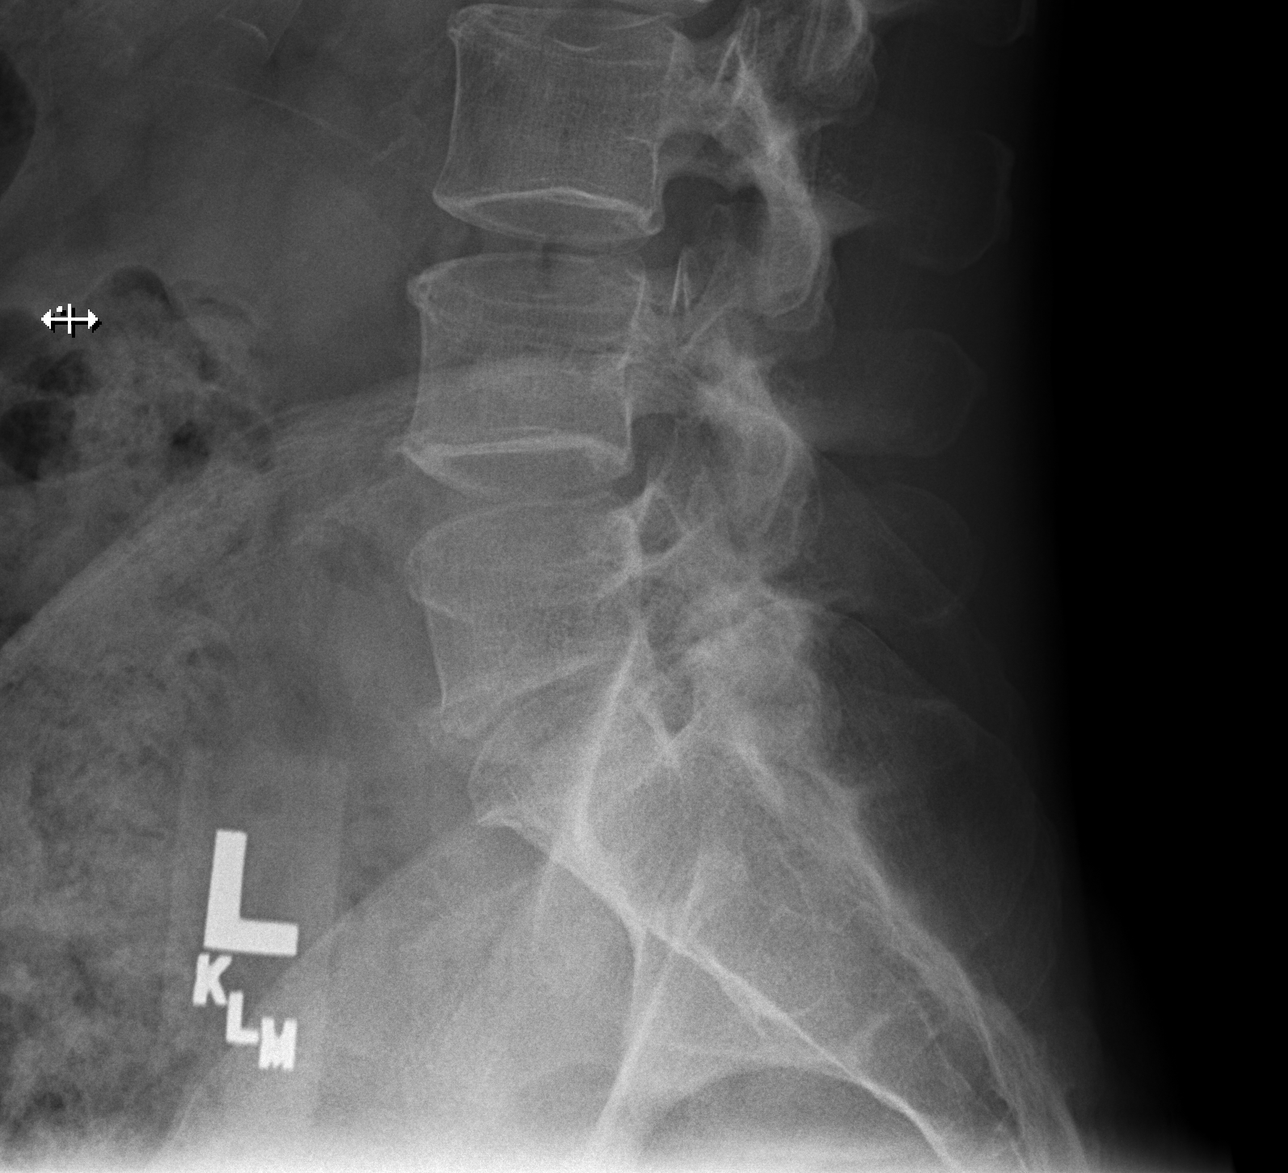

[5 of 5 positions shown; findings below may reference images not displayed]

FINDINGS: Frontal, bilateral oblique, lateral views of the lumbar spine are
obtained. 5 non rib-bearing lumbar type vertebral bodies are in
anatomic alignment. There is moderate disc space narrowing and
osteophyte formation at the L5/S1 level. More mild disc space
narrowing and osteophyte seen at T11/T12. Mild facet hypertrophy at
L4-5 and to a greater extent L5-S1.

Mild irregularity along the right sacroiliac joint could reflect
sacroiliitis.
IMPRESSION: 1. Prominent spondylosis at L5/S1.
2. Findings consistent with right-sided sacroiliitis.

## 2019-10-18 MED FILL — METHYLPHENIDATE HCL 20 MG T: 20 | 30 days supply | Qty: 90 | Fill #0

## 2019-10-18 NOTE — Progress Notes (Signed)
Office Visit Note   Patient: Dorothy Wood           Date of Birth: [birth year]           MRN: [id number] Visit Date: 10/18/2019 Requested by: Denita Lung, MD [place],  [place] [id number] PCP: Denita Lung, MD  Subjective: Chief Complaint  Patient presents with  . Lower Back - Pain    Pain in the lower back, mainly on the right side, x 6 weeks. Pain in the buttock and down posterior thigh to knee. Weakness in the leg. Did injure her hamstring in the Summer of 2020. Got better. Had Lsp xray this a.m. at [facility].    HPI: She is here with right posterior hip pain.  This past summer she hyperextended her leg resulting in injury to the proximal hamstring area.  She had some bruising afterward.  She went to Dr. Redmond [facility] for evaluation.  After she left the appointment on her way back to the car, she slipped and fell landing on her right lateral hip and injured a different area.  Over the next several weeks her pain improved significantly to the point that she was pretty much pain-free.  Then about 6 weeks ago she started having pain again in the posterior hip.  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  She spends long hours sitting.  She wonders whether this might have something to do with the onset of her pain.  Pain radiates into the hamstring area and down toward the knee but not below the knee.  Denies any numbness or weakness, denies any low back pain.  She recently went for x-rays which I reviewed, her x-rays show mild to moderate L5-S1 degenerative disc disease and slight sclerotic change of the right SI joint.  Hip joints are well-preserved.  She has been using ibuprofen with some relief.               ROS: No bowel or bladder dysfunction.  All other systems were reviewed and are negative.  Objective: Vital Signs: There were no vitals taken for this  visit.  Physical Exam:  General:  Alert and oriented, in no acute distress. Pulm:  Breathing unlabored. Psy:  Normal mood, congruent affect. Skin: No rash. Right hip: Negative straight leg raise, lower extremity strength and reflexes are mostly normal but she does have some weakness with hip abduction on the right, not sure if it is true weakness versus give way weakness due to pain.  She is tender along the course of the piriformis muscle and at the greater trochanter.  Direct palpation of the hamstring tendon did not elicit much pain but she does have a little tenderness over the ischial tuberosity.  She has slight pain with flexion of the knee against resistance but good hamstring strength.  Imaging: Limited diagnostic ultrasound right hip: The gluteus medius muscle belly has slight disruption of the muscle fibers which appears to be a chronic and healed partial muscle tear.  The piriformis looks intact.  Direct palpation over the sciatic nerve under ultrasound guidance elicited some discomfort but did not reproduce her pain.  No significant bursal effusion over the greater trochanter or the ischial tuberosity.  Assessment & Plan: 1.  Right posterior hip pain, possibilities include piriformis syndrome or possibly lumbar disc protrusion with foraminal stenosis. -We will try home stretches and  formal physical therapy.  Ibuprofen as needed, baclofen as needed. -If symptoms persist consider lumbar MRI scan.     Procedures: No procedures performed  No notes on file     PMFS History: Patient Active Problem List   Diagnosis Date Noted  . Hormone replacement therapy (HRT) 09/09/2016  . Hypothyroidism 07/16/2014  . Sjogren's syndrome ([place]) 04/11/2013  . History of ovarian cancer 04/11/2013  . Personal history of colonic polyps 04/11/2013  . ADD (attention deficit disorder) 07/06/2012  . Dysthymia 07/06/2012   Past Medical History:  Diagnosis Date  . Cancer (HCC)    OVARIAN  .  Colonic polyp   . Hyperlipidemia   . Hypertension   . Sjogren's disease ([place])   . Thyroid disease    HYPOTHYROIDISM    Family History  Problem Relation Age of Onset  . Mental illness Mother   . Hypertension Mother   . Heart failure Mother   . Mental illness Sister   . Mental illness Brother   . Mental illness Maternal Uncle   . Mental illness Maternal Grandmother   . Cancer Paternal Grandmother   . CAD Father        CABG at age 61    Past Surgical History:  Procedure Laterality Date  . ABDOMINAL HYSTERECTOMY     Ovarian cancer  . BREAST CYST ASPIRATION    . CESAREAN SECTION     x2  . COLONOSCOPY  10-06   Dr. Earnest Bailey  . TONSILLECTOMY     Social History   Occupational History  . Not on file  Tobacco Use  . Smoking status: Never Smoker  . Smokeless tobacco: Never Used  Substance and Sexual Activity  . Alcohol use: Yes    Comment: 6 glasses per week  . Drug use: No  . Sexual activity: Not Currently

## 2019-10-23 ENCOUNTER — Other Ambulatory Visit: Payer: Self-pay

## 2019-10-23 ENCOUNTER — Ambulatory Visit (INDEPENDENT_AMBULATORY_CARE_PROVIDER_SITE_OTHER): Payer: 59 | Admitting: Physical Therapy

## 2019-10-23 ENCOUNTER — Encounter: Payer: Self-pay | Admitting: Physical Therapy

## 2019-10-23 DIAGNOSIS — M79604 Pain in right leg: Secondary | ICD-10-CM

## 2019-10-23 DIAGNOSIS — R29898 Other symptoms and signs involving the musculoskeletal system: Secondary | ICD-10-CM | POA: Diagnosis not present

## 2019-10-23 DIAGNOSIS — M6281 Muscle weakness (generalized): Secondary | ICD-10-CM | POA: Diagnosis not present

## 2019-10-23 NOTE — Patient Instructions (Signed)
Access Code: [email or web address]  URL: [email or web address]  Date: 10/23/2019  Prepared by: Faustino Congress   Exercises Supine Hamstring Stretch with Strap - 3 reps - 1 sets - 30 sec hold - 2x daily - 7x weekly Supine Piriformis Stretch with Foot on Ground - 3 reps - 1 sets - 30 sec hold - 2x daily - 7x weekly Prone on Elbows Stretch - 1 reps - 1 sets - 3 min hold - 2x daily - 7x weekly Prone Press Up on Elbows - 10 reps - 1 sets - 10 sec hold - 2x daily - 7x weekly Patient Education Trigger Point Dry Needling

## 2019-10-23 NOTE — Therapy (Signed)
[facility] [address] [place], [place], [address] Phone: [phone]   Fax:  [phone]  Physical Therapy Evaluation  Patient Details  Name: Dorothy Wood MRN: [id number] Date of Birth: 01-19-1961 Referring Provider (PT): Eunice Blase, MD   Encounter Date: 10/23/2019  PT End of Session - 10/23/19 1309    Visit Number  1    Number of Visits  12    Date for PT Re-Evaluation  12/04/19    PT Start Time  0809    PT Stop Time  0840    PT Time Calculation (min)  31 min    Activity Tolerance  Patient tolerated treatment well    Behavior During Therapy  [facility] for tasks assessed/performed       Past Medical History:  Diagnosis Date  . Cancer (HCC)    OVARIAN  . Colonic polyp   . Hyperlipidemia   . Hypertension   . Sjogren's disease ([place])   . Thyroid disease    HYPOTHYROIDISM    Past Surgical History:  Procedure Laterality Date  . ABDOMINAL HYSTERECTOMY     Ovarian cancer  . BREAST CYST ASPIRATION    . CESAREAN SECTION     x2  . COLONOSCOPY  10-06   Dr. Earnest Bailey  . TONSILLECTOMY      There were no vitals filed for this visit.   Subjective Assessment - 10/23/19 0811    Subjective  Pt is a 59 y/o female who presents to OPPT for Rt hip pain.  Pt reports in the spring she tore her hamstring, and following complete resolve she fell injuring her hip.  Pt reports symptoms improved, and she reports taking new position where she is now sitting all day.  She feels pain is now bil and at ischial tuberosity and radiating down middle of both legs.    How long can you sit comfortably?  pain with getting up    Patient Stated Goals  learn stretching, improve strength    Currently in Pain?  Yes    Pain Score  3    up to 8/10; at best 0/10   Pain Location  Buttocks    Pain Orientation  Left;Right   Rt>Lt   Pain Descriptors / Indicators  Shooting;Cramping   deep cramp   Pain Type  Acute pain    Pain Radiating Towards  distal thigh    Pain  Onset  More than a month ago    Pain Frequency  Intermittent    Aggravating Factors   movement, rolling over in bed    Pain Relieving Factors  rest, immobilization         [facility] PT Assessment - 10/23/19 0816      Assessment   Medical Diagnosis  M25.551 (ICD-10-CM) - Pain in right hip    Referring Provider (PT)  Hilts, Michael, MD    Onset Date/Surgical Date  --   4 weeks ago   Next MD Visit  PRN    Prior Therapy  none      Precautions   Precautions  None      Restrictions   Weight Bearing Restrictions  No      Balance Screen   Has the patient fallen in the past 6 months  Yes    How many times?  1    Has the patient had a decrease in activity level because of a fear of falling?   No    Is the patient reluctant to leave their  home because of a fear of falling?   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    Leisure  walking 1 mile every day - currently unable (stopped about 2 weeks ago)      Cognition   Overall Cognitive Status  Within Functional Limits for tasks assessed      Observation/Other Assessments   Observations  LLE atrophy noted      Posture/Postural Control   Posture/Postural Control  Postural limitations    Postural Limitations  Rounded Shoulders;Forward head      ROM / Strength   AROM / PROM / Strength  Strength;AROM      AROM   Overall AROM   Within functional limits for tasks performed      Strength   Strength Assessment Site  Hip;Knee;Ankle    Right/Left Hip  Right;Left    Right Hip Flexion  3/5    Right Hip Extension  3/5    Right Hip ABduction  3/5    Left Hip Flexion  5/5    Left Hip Extension  4/5    Left Hip ABduction  5/5     Right/Left Knee  Right;Left    Right Knee Flexion  3/5    Right Knee Extension  5/5    Left Knee Flexion  3/5    Left Knee Extension  5/5    Right/Left Ankle  Right;Left    Right Ankle Dorsiflexion  5/5    Left Ankle Dorsiflexion  5/5      Flexibility   Soft Tissue Assessment /Muscle Length  yes    Hamstrings  tightness bil    Piriformis  tightness bil      Palpation   Palpation comment  active trigger points with reproduction of pain Rt hamstring, Rt glute med, and into piriformis      Special Tests    Special Tests  Lumbar    Lumbar Tests  Straight Leg Raise      Straight Leg Raise   Findings  Negative                Objective measurements completed on examination: See above findings.              PT Education - 10/23/19 1309    Education Details  HEP    Person(s) Educated  Patient    Methods  Explanation;Demonstration;Handout    Comprehension  Verbalized understanding;Returned demonstration;Need further instruction          PT Long Term Goals - 10/23/19 1320      PT LONG TERM GOAL #1   Title  independent with HEP    Status  New    Target Date  12/04/19      PT LONG TERM GOAL #2   Title  report decrease in pain by 50% for improved function and decreased work interruptions    Status  New    Target Date  12/04/19      PT LONG TERM GOAL #3   Title  demonstrate at least 4/5 LLE strength for improved  function    Status  New    Target Date  12/04/19             Plan - 10/23/19 1310    Clinical Impression Statement  Pt is a 59 y/o female who presents to OPPT for Rt>Lt hamstring and glute pain.  Pt demonstrates active trigger points, decreased strength and flexibility affecting functional mobility.  Cannot rule out lumbar spine involement given her visible atrophy at this time, but will plan to treat symptoms and clinical findings.  Will benefit from PT to address deficits listed.    Personal Factors and Comorbidities  Comorbidity 3+     Comorbidities  hx Ovarian cancer, Sjogren's disease, HTN    Examination-Activity Limitations  Bathing;Transfers;Locomotion Level;Squat;Stand;Stairs;Carry    Examination-Participation Restrictions  Other   occupation   Stability/Clinical Decision Making  Evolving/Moderate complexity    Clinical Decision Making  Moderate    Rehab Potential  Good    PT Frequency  2x / week    PT Duration  6 weeks    PT Treatment/Interventions  ADLs/Self Care Home Management;Cryotherapy;Electrical Stimulation;Ultrasound;Moist Heat;Iontophoresis 4mg /ml Dexamethasone;Gait training;Stair training;Functional mobility training;Therapeutic activities;Therapeutic exercise;Patient/family education;Neuromuscular re-education;Passive range of motion;Dry needling;Taping;Manual techniques    PT Next Visit Plan  review HEP, DN to glutes/piriformis/hamstrings, manual/modalities PRN, work on strength as able    PT Home Exercise Plan  Access Code: [email or web address]    Consulted and Agree with Plan of Care  Patient       Patient will benefit from skilled therapeutic intervention in order to improve the following deficits and impairments:  Increased fascial restricitons, Increased muscle spasms, Pain, Decreased mobility, Decreased strength, Impaired flexibility  Visit Diagnosis: Pain in right leg  Other symptoms and signs involving the musculoskeletal system  Muscle weakness (generalized)     Problem List Patient Active Problem List   Diagnosis Date Noted  . Hormone replacement therapy (HRT) 09/09/2016  . Hypothyroidism 07/16/2014  . Sjogren's syndrome ([place]) 04/11/2013  . History of ovarian cancer 04/11/2013  . Personal history of colonic polyps 04/11/2013  . ADD (attention deficit disorder) 07/06/2012  . Dysthymia 07/06/2012      Laureen Abrahams, PT, DPT 10/23/19 1:23 PM     [facility] [address] [place], [place], [address] Phone: [phone]   Fax:   [phone]  Name: Dorothy Wood MRN: [id number] Date of Birth: 10/23/1960

## 2019-10-24 ENCOUNTER — Ambulatory Visit: Payer: 59 | Admitting: Physical Therapy

## 2019-10-24 ENCOUNTER — Encounter: Payer: Self-pay | Admitting: Physical Therapy

## 2019-10-24 DIAGNOSIS — I10 Essential (primary) hypertension: Secondary | ICD-10-CM | POA: Diagnosis not present

## 2019-10-24 DIAGNOSIS — R29898 Other symptoms and signs involving the musculoskeletal system: Secondary | ICD-10-CM | POA: Diagnosis not present

## 2019-10-24 DIAGNOSIS — M6281 Muscle weakness (generalized): Secondary | ICD-10-CM

## 2019-10-24 DIAGNOSIS — E7800 Pure hypercholesterolemia, unspecified: Secondary | ICD-10-CM | POA: Diagnosis not present

## 2019-10-24 DIAGNOSIS — M79604 Pain in right leg: Secondary | ICD-10-CM | POA: Diagnosis not present

## 2019-10-24 LAB — LIPID PANEL
Chol/HDL Ratio: 4.3 ratio (ref 0.0–4.4)
Cholesterol, Total: 197 mg/dL (ref 100–199)
HDL: 46 mg/dL (ref >39)
LDL Chol Calc (NIH): 121 mg/dL — ABNORMAL HIGH (ref 0–99)
Triglycerides: 172 mg/dL — ABNORMAL HIGH (ref 0–149)
VLDL Cholesterol Cal: 30 mg/dL (ref 5–40)

## 2019-10-24 LAB — BASIC METABOLIC PANEL
BUN/Creatinine Ratio: 16 (ref 9–23)
BUN: 13 mg/dL (ref 6–24)
CO2: 26 mmol/L (ref 20–29)
Calcium: 9.3 mg/dL (ref 8.7–10.2)
Chloride: 101 mmol/L (ref 96–106)
Creatinine, Ser: 0.79 mg/dL (ref 0.57–1.00)
GFR calc Af Amer: 95 mL/min/{1.73_m2} (ref >59)
GFR calc non Af Amer: 82 mL/min/{1.73_m2} (ref >59)
Glucose: 107 mg/dL — ABNORMAL HIGH (ref 65–99)
Potassium: 4.9 mmol/L (ref 3.5–5.2)
Sodium: 139 mmol/L (ref 134–144)

## 2019-10-24 NOTE — Therapy (Signed)
[facility] Physical Therapy [address] [place], [place], [address] Phone: [phone]   Fax:  [phone]  Physical Therapy Treatment  Patient Details  Name: Dorothy Wood MRN: [id number] Date of Birth: [birth year] Referring Provider (PT): Eunice Blase, MD   Encounter Date: 10/24/2019  PT End of Session - 10/24/19 1007    Visit Number  2    Number of Visits  12    Date for PT Re-Evaluation  12/04/19    PT Start Time  0931    PT Stop Time  1009    PT Time Calculation (min)  38 min    Activity Tolerance  Patient tolerated treatment well    Behavior During Therapy  [facility] for tasks assessed/performed       Past Medical History:  Diagnosis Date  . Cancer (HCC)    OVARIAN  . Colonic polyp   . Hyperlipidemia   . Hypertension   . Sjogren's disease ([place])   . Thyroid disease    HYPOTHYROIDISM    Past Surgical History:  Procedure Laterality Date  . ABDOMINAL HYSTERECTOMY     Ovarian cancer  . BREAST CYST ASPIRATION    . CESAREAN SECTION     x2  . COLONOSCOPY  10-06   Dr. Earnest Bailey  . TONSILLECTOMY      There were no vitals filed for this visit.  Subjective Assessment - 10/24/19 0934    Subjective  doing well today; no changes.  did stretches yesterday - no issues with them.    How long can you sit comfortably?  pain with getting up    Patient Stated Goals  learn stretching, improve strength    Currently in Pain?  --   c/o pain in Rt knee -otherwise no issues   Pain Onset  More than a month ago                       [facility] Adult PT Treatment/Exercise - 10/24/19 0937      Exercises   Exercises  Knee/Hip      Knee/Hip Exercises: Stretches   Passive Hamstring Stretch  Right;Left;2 reps;30 seconds    Passive Hamstring Stretch Limitations  demonstrated seated option as well      Knee/Hip Exercises: Aerobic   Recumbent Bike  L3 x 6 min      Manual Therapy   Manual Therapy  Soft tissue mobilization    Manual therapy comments   skilled palpation and monitoring of soft tissue during DN    Soft tissue mobilization  STM and IASTM to Rt hamstring       Trigger Point Dry Needling - 10/24/19 0959    Consent Given?  Yes    Education Handout Provided  Previously provided    Muscles Treated Lower Quadrant  Hamstring    Hamstring Response  Twitch response elicited;Palpable increased muscle length                PT Long Term Goals - 10/23/19 1320      PT LONG TERM GOAL #1   Title  independent with HEP    Status  New    Target Date  12/04/19      PT LONG TERM GOAL #2   Title  report decrease in pain by 50% for improved function and decreased work interruptions    Status  New    Target Date  12/04/19      PT LONG TERM GOAL #3   Title  demonstrate at least 4/5 LLE strength for improved function    Status  New    Target Date  12/04/19            Plan - 10/24/19 1007    Clinical Impression Statement  Pt had positive response from DN today with decreased tightness and pain following session.  No goals met as only 2nd visit.  Will continue to benefit from PT to maximize function.    Personal Factors and Comorbidities  Comorbidity 3+    Comorbidities  hx Ovarian cancer, Sjogren's disease, HTN    Examination-Activity Limitations  Bathing;Transfers;Locomotion Level;Squat;Stand;Stairs;Carry    Examination-Participation Restrictions  Other   occupation   Stability/Clinical Decision Making  Evolving/Moderate complexity    Rehab Potential  Good    PT Frequency  2x / week    PT Duration  6 weeks    PT Treatment/Interventions  ADLs/Self Care Home Management;Cryotherapy;Electrical Stimulation;Ultrasound;Moist Heat;Iontophoresis 48m/ml Dexamethasone;Gait training;Stair training;Functional mobility training;Therapeutic activities;Therapeutic exercise;Patient/family education;Neuromuscular re-education;Passive range of motion;Dry needling;Taping;Manual techniques    PT Next Visit Plan  review HEP, DN to  glutes/piriformis/hamstrings, manual/modalities PRN, work on strength as able; assess response to DN    PT Home Exercise Plan  Access Code: [email or web address]    Consulted and Agree with Plan of Care  Patient       Patient will benefit from skilled therapeutic intervention in order to improve the following deficits and impairments:  Increased fascial restricitons, Increased muscle spasms, Pain, Decreased mobility, Decreased strength, Impaired flexibility  Visit Diagnosis: Pain in right leg  Other symptoms and signs involving the musculoskeletal system  Muscle weakness (generalized)     Problem List Patient Active Problem List   Diagnosis Date Noted  . Hormone replacement therapy (HRT) 09/09/2016  . Hypothyroidism 07/16/2014  . [place]'s [place] 04/11/2013  . History of ovarian cancer 04/11/2013  . Personal history of colonic polyps 04/11/2013  . ADD (attention deficit disorder) 07/06/2012  . Dysthymia 07/06/2012      SLaureen Abrahams PT, DPT 10/24/19 10:11 AM    [facility] [address] [address]Phone: [phone]  Fax:  [phone] Name: Dorothy ROBBSMRN: [id number] of Birth: 1Jun 03, 1962

## 2019-10-25 ENCOUNTER — Telehealth: Payer: Self-pay | Admitting: *Deleted

## 2019-10-25 NOTE — Telephone Encounter (Addendum)
Results released to my chart   ----- Message from Lelon Perla, MD sent at 10/24/2019  4:49 PM EST ----- Continue diet.  Await calcium score.  If elevated will need statin. Kirk Ruths

## 2019-10-27 ENCOUNTER — Other Ambulatory Visit: Payer: Self-pay

## 2019-10-27 ENCOUNTER — Ambulatory Visit (INDEPENDENT_AMBULATORY_CARE_PROVIDER_SITE_OTHER)
Admission: RE | Admit: 2019-10-27 | Discharge: 2019-10-27 | Disposition: A | Discharge status: Home / Self Care | Payer: Self-pay | Source: Ambulatory Visit | Attending: Cardiology | Admitting: Cardiology

## 2019-10-27 DIAGNOSIS — E7800 Pure hypercholesterolemia, unspecified: Secondary | ICD-10-CM

## 2019-10-27 DIAGNOSIS — I10 Essential (primary) hypertension: Secondary | ICD-10-CM

## 2019-10-27 IMAGING — CT CT CARDIAC CORONARY ARTERY CALCIUM SCORE
3 series · 14 of 20 positions shown, 15 images · non-contrast
Comparison: None.
COMPARISON: None.

Addendum:
EXAM:
OVER-READ INTERPRETATION  CT CHEST

The following report is an over-read performed by radiologist Dr.
GODUNOVA [REDACTED] on [DATE]. This over-read
does not include interpretation of cardiac or coronary anatomy or
pathology. The coronary calcium score/coronary CTA interpretation by
the cardiologist is attached.
CLINICAL DATA: Risk stratification
Coronary Calcium Score
TECHNIQUE: The patient was scanned on a Siemens Force scanner. Axial
non-contrast 3 mm slices were carried out through the heart. The
data set was analyzed on a dedicated work station and scored using
the Agatson method.

[Series 2: casc 3.0 bv41 2 bestsyst 35 % · axial · 0.31mm/px · z∈[-219,-138]mm · 4 of 45 slices shown, 5 images]
[im 9/45  vessel]
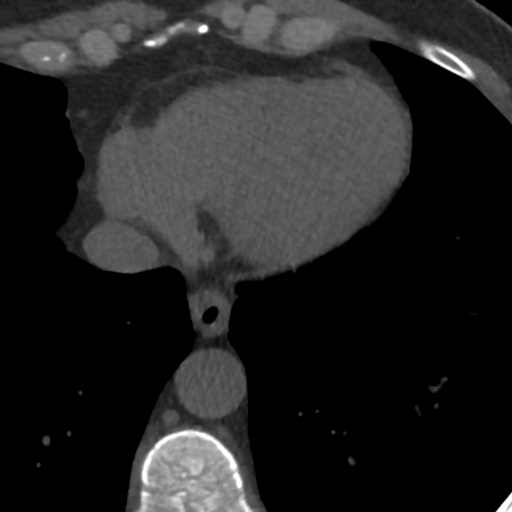
[im 9/45  lung]
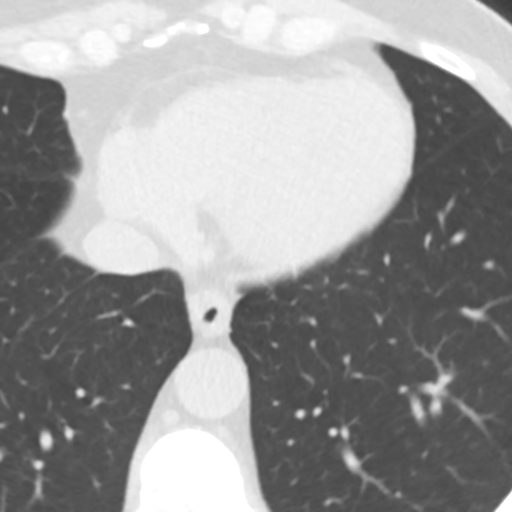
[im 18/45  vessel]
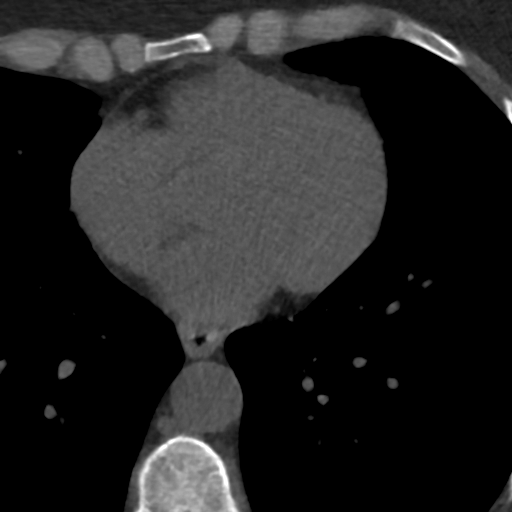
[im 27/45  vessel]
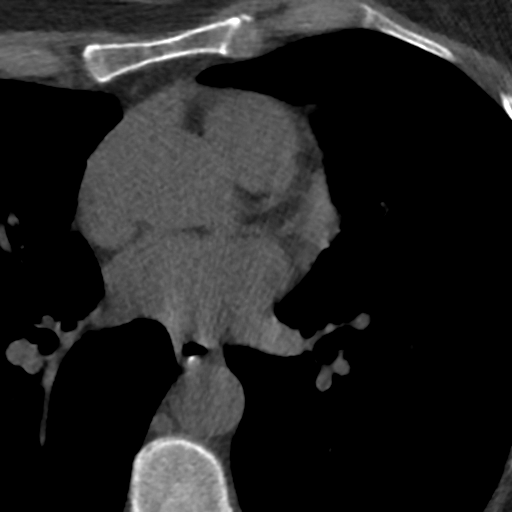
[im 36/45  vessel]
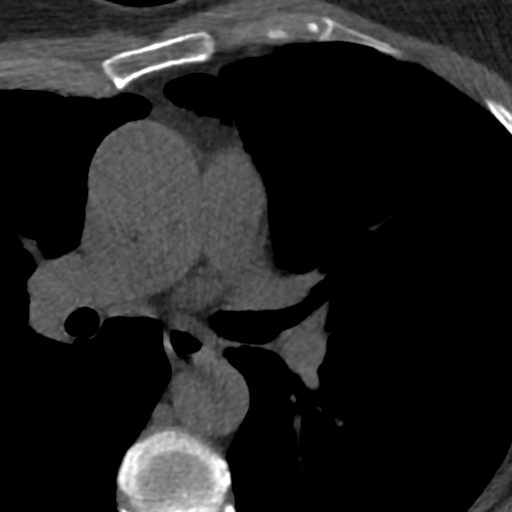

[Series 3: lung 37 % · axial · 0.64mm/px · z∈[-222,-135]mm · 5 of 45 slices shown]
[im 8/45  lung]
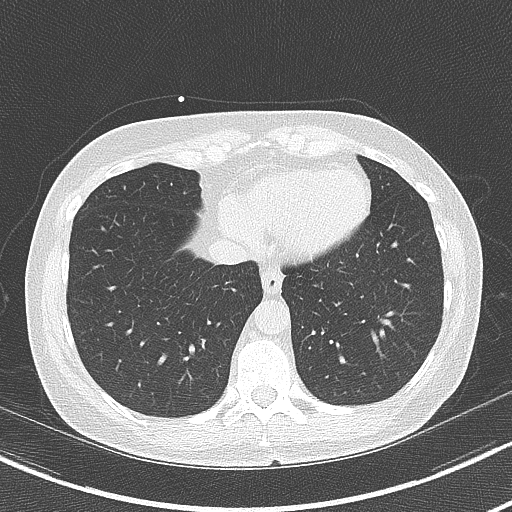
[im 15/45  lung]
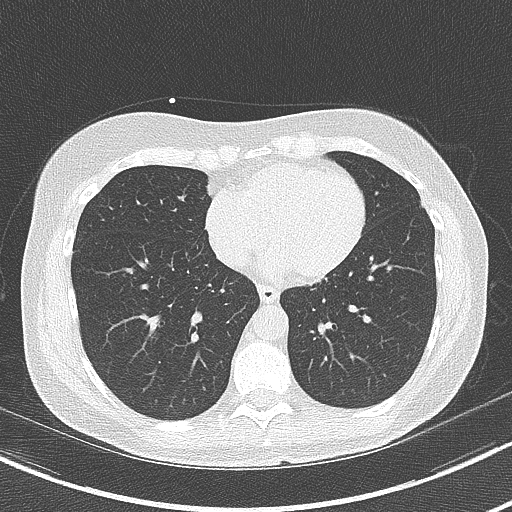
[im 23/45  lung]
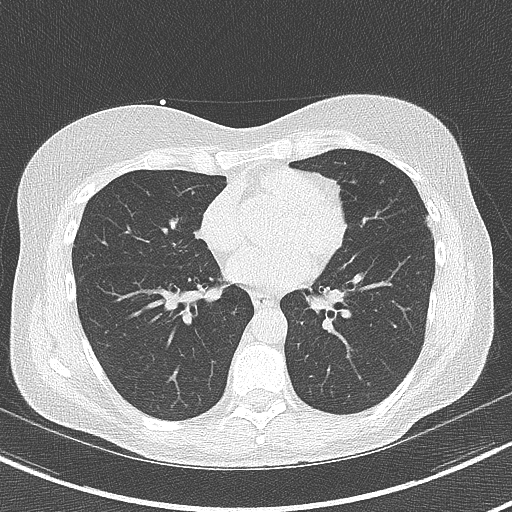
[im 30/45  lung]
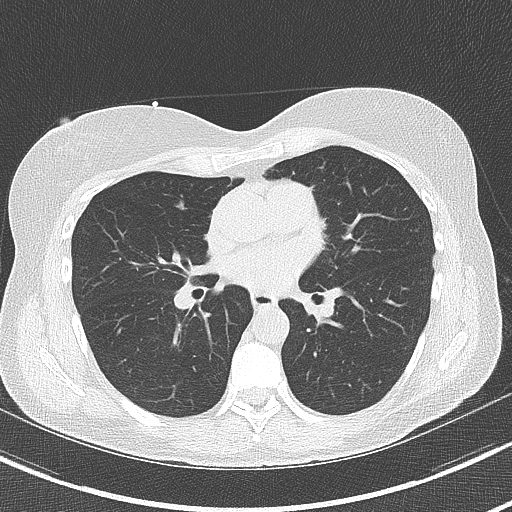
[im 37/45  lung]
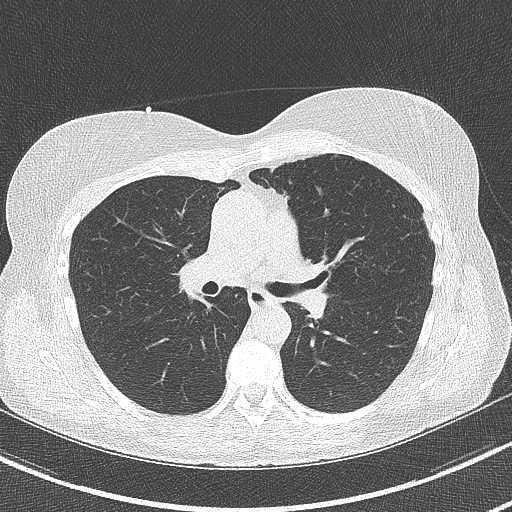

[Series 4: lung st 37 % · axial · 0.64mm/px · z∈[-222,-135]mm · 5 of 45 slices shown]
[im 8/45  lung]
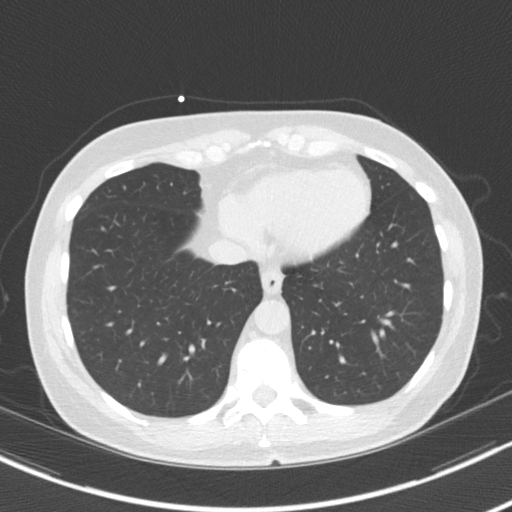
[im 15/45  lung]
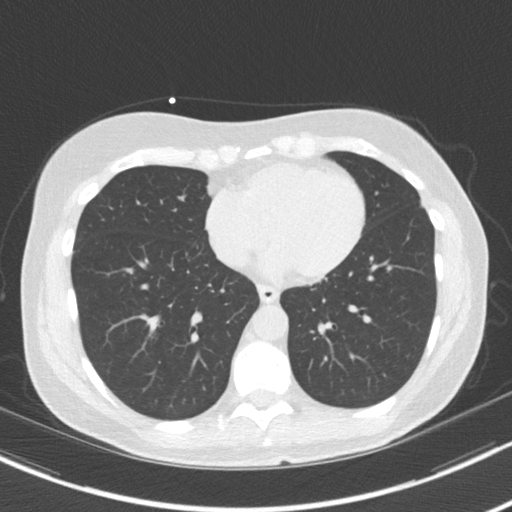
[im 23/45  lung]
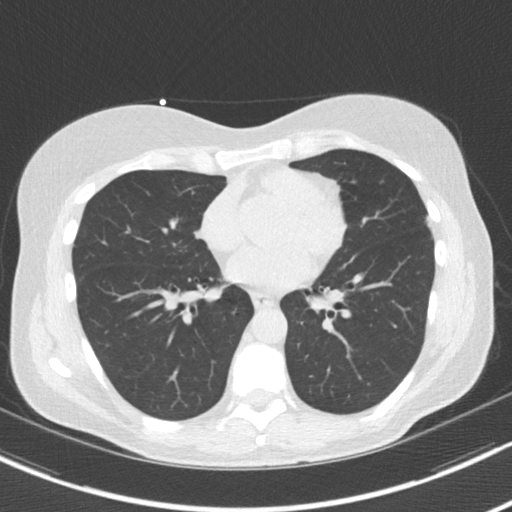
[im 30/45  lung]
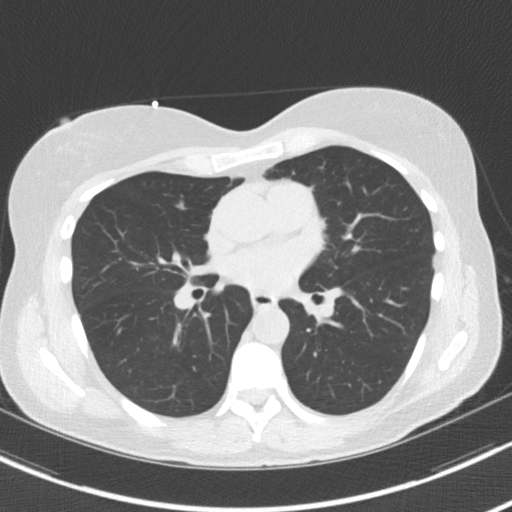
[im 37/45  lung]
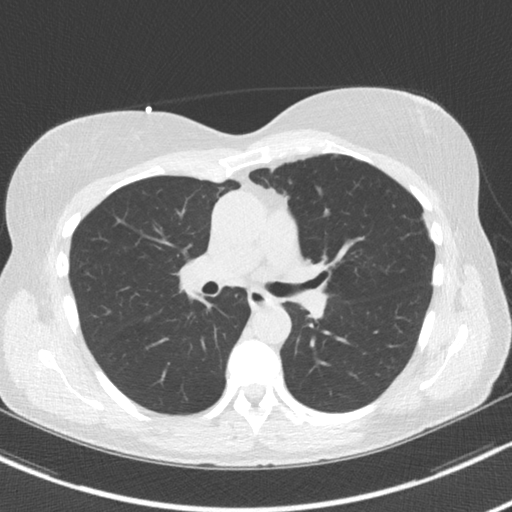

[14 of 20 positions shown; findings below may reference images not displayed]

FINDINGS: Vascular: Ascending thoracic aorta measures 3.3 cm. Descending
thoracic aorta measures 2.3 cm. Heart size is normal. Minimal
pericardial fluid.

Mediastinum/Nodes: Visualized mediastinal structures are
unremarkable.

Lungs/Pleura: Small peripheral or pleural based density in the
posterior right lower lobe measures 3 mm on sequence 3, image 40. No
large pleural effusions. Mild pleural thickening along the anterior
left upper lobe on sequence 3, image 10. Small amount of focal
pleural-based thickening near the anterior aspect of the right minor
fissure on sequence 3, image 12. Slightly prominent interstitial
densities in the medial left upper lobe on sequence 3, image 8. Mild
pleural thickening in left lower lobe near the left major fissure on
sequence 3, image 25.

Upper Abdomen: Images of the upper abdomen are unremarkable.

Musculoskeletal: No acute bone abnormality.
IMPRESSION: Scattered areas of pleural thickening and questionable scarring in
the lungs. Findings are nonspecific. There is at least 1 peripheral
or pleural based nodule measuring up to 3 mm that is indeterminate.
No follow-up needed if patient is low-risk. Non-contrast chest CT
can be considered in 12 months if patient is high-risk. This
recommendation follows the consensus statement: Guidelines for
Management of Incidental Pulmonary Nodules Detected on CT Images:
FINDINGS: Non-cardiac: See separate report from [REDACTED].

Ascending Aorta: Normal size

Pericardium: Normal

Coronary arteries: Calcium score 0
IMPRESSION: Coronary calcium score of 0.

*** End of Addendum ***
EXAM:
OVER-READ INTERPRETATION  CT CHEST

The following report is an over-read performed by radiologist Dr.
GODUNOVA [REDACTED] on [DATE]. This over-read
does not include interpretation of cardiac or coronary anatomy or
pathology. The coronary calcium score/coronary CTA interpretation by
the cardiologist is attached.
FINDINGS: Vascular: Ascending thoracic aorta measures 3.3 cm. Descending
thoracic aorta measures 2.3 cm. Heart size is normal. Minimal
pericardial fluid.

Mediastinum/Nodes: Visualized mediastinal structures are
unremarkable.

Lungs/Pleura: Small peripheral or pleural based density in the
posterior right lower lobe measures 3 mm on sequence 3, image 40. No
large pleural effusions. Mild pleural thickening along the anterior
left upper lobe on sequence 3, image 10. Small amount of focal
pleural-based thickening near the anterior aspect of the right minor
fissure on sequence 3, image 12. Slightly prominent interstitial
densities in the medial left upper lobe on sequence 3, image 8. Mild
pleural thickening in left lower lobe near the left major fissure on
sequence 3, image 25.

Upper Abdomen: Images of the upper abdomen are unremarkable.

Musculoskeletal: No acute bone abnormality.
IMPRESSION: Scattered areas of pleural thickening and questionable scarring in
the lungs. Findings are nonspecific. There is at least 1 peripheral
or pleural based nodule measuring up to 3 mm that is indeterminate.
No follow-up needed if patient is low-risk. Non-contrast chest CT
can be considered in 12 months if patient is high-risk. This
recommendation follows the consensus statement: Guidelines for
Management of Incidental Pulmonary Nodules Detected on CT Images:

## 2019-10-30 ENCOUNTER — Ambulatory Visit: Payer: 59 | Admitting: Physical Therapy

## 2019-10-30 ENCOUNTER — Encounter: Payer: Self-pay | Admitting: Physical Therapy

## 2019-10-30 ENCOUNTER — Other Ambulatory Visit: Payer: Self-pay

## 2019-10-30 DIAGNOSIS — R29898 Other symptoms and signs involving the musculoskeletal system: Secondary | ICD-10-CM

## 2019-10-30 DIAGNOSIS — M6281 Muscle weakness (generalized): Secondary | ICD-10-CM

## 2019-10-30 DIAGNOSIS — M79604 Pain in right leg: Secondary | ICD-10-CM | POA: Diagnosis not present

## 2019-10-30 NOTE — Therapy (Signed)
[facility] Physical Therapy [address] [place], [place], [address] Phone: [phone]   Fax:  [phone]  Physical Therapy Treatment  Patient Details  Name: Dorothy Wood MRN: [id number] Date of Birth: 04-18-1961 Referring Provider (PT): Eunice Blase, MD   Encounter Date: 10/30/2019  PT End of Session - 10/30/19 1006    Visit Number  3    Number of Visits  12    Date for PT Re-Evaluation  12/04/19    PT Start Time  0926    PT Stop Time  1005    PT Time Calculation (min)  39 min    Activity Tolerance  Patient tolerated treatment well    Behavior During Therapy  [facility] for tasks assessed/performed       Past Medical History:  Diagnosis Date  . Cancer (HCC)    OVARIAN  . Colonic polyp   . Hyperlipidemia   . Hypertension   . Sjogren's disease ([place])   . Thyroid disease    HYPOTHYROIDISM    Past Surgical History:  Procedure Laterality Date  . ABDOMINAL HYSTERECTOMY     Ovarian cancer  . BREAST CYST ASPIRATION    . CESAREAN SECTION     x2  . COLONOSCOPY  10-06   Dr. Earnest Bailey  . TONSILLECTOMY      There were no vitals filed for this visit.  Subjective Assessment - 10/30/19 0926    Subjective  feels much better; still has one spot that is painful    How long can you sit comfortably?  pain with getting up    Patient Stated Goals  learn stretching, improve strength    Currently in Pain?  No/denies    Pain Onset  More than a month ago                       [facility] Adult PT Treatment/Exercise - 10/30/19 0931      Knee/Hip Exercises: Aerobic   Recumbent Bike  L3 x 6 min      Knee/Hip Exercises: Supine   Other Supine Knee/Hip Exercises  test retest rolling after DN with decreased pain following DN      Manual Therapy   Manual Therapy  Soft tissue mobilization    Manual therapy comments  skilled palpation and monitoring of soft tissue during DN    Soft tissue mobilization  STM and IASTM to Rt hamstring       Trigger Point  Dry Needling - 10/30/19 0949    Consent Given?  Yes    Education Handout Provided  Previously provided    Muscles Treated Lower Quadrant  Hamstring    Electrical Stimulation Performed with Dry Needling  Yes    E-stim with Dry Needling Details  4-5 mHz freq; to tolerance x 5 min    Hamstring Response  Twitch response elicited;Palpable increased muscle length                PT Long Term Goals - 10/23/19 1320      PT LONG TERM GOAL #1   Title  independent with HEP    Status  New    Target Date  12/04/19      PT LONG TERM GOAL #2   Title  report decrease in pain by 50% for improved function and decreased work interruptions    Status  New    Target Date  12/04/19      PT LONG TERM GOAL #3   Title  demonstrate  at least 4/5 LLE strength for improved function    Status  New    Target Date  12/04/19            Plan - 10/30/19 1006    Clinical Impression Statement  Pt with decreased pain today following DN, and reports improvement overall.  Does have some piriformis tightness and feel this may benefit from DN as well, but declined today.  Will continue to benefit from PT to maximize function.    Personal Factors and Comorbidities  Comorbidity 3+    Comorbidities  hx Ovarian cancer, Sjogren's disease, HTN    Examination-Activity Limitations  Bathing;Transfers;Locomotion Level;Squat;Stand;Stairs;Carry    Examination-Participation Restrictions  Other   occupation   Stability/Clinical Decision Making  Evolving/Moderate complexity    Rehab Potential  Good    PT Frequency  2x / week    PT Duration  6 weeks    PT Treatment/Interventions  ADLs/Self Care Home Management;Cryotherapy;Electrical Stimulation;Ultrasound;Moist Heat;Iontophoresis 4mg /ml Dexamethasone;Gait training;Stair training;Functional mobility training;Therapeutic activities;Therapeutic exercise;Patient/family education;Neuromuscular re-education;Passive range of motion;Dry needling;Taping;Manual techniques    PT  Next Visit Plan  review HEP, DN to glutes/piriformis/hamstrings, manual/modalities PRN, work on strength as able; assess response to DN    PT Home Exercise Plan  Access Code: [email or web address]    Consulted and Agree with Plan of Care  Patient       Patient will benefit from skilled therapeutic intervention in order to improve the following deficits and impairments:  Increased fascial restricitons, Increased muscle spasms, Pain, Decreased mobility, Decreased strength, Impaired flexibility  Visit Diagnosis: Pain in right leg  Other symptoms and signs involving the musculoskeletal system  Muscle weakness (generalized)     Problem List Patient Active Problem List   Diagnosis Date Noted  . Hormone replacement therapy (HRT) 09/09/2016  . Hypothyroidism 07/16/2014  . Sjogren's syndrome ([place]) 04/11/2013  . History of ovarian cancer 04/11/2013  . Personal history of colonic polyps 04/11/2013  . ADD (attention deficit disorder) 07/06/2012  . Dysthymia 07/06/2012      Laureen Abrahams, PT, DPT 10/30/19 10:09 AM     [facility] Physical Therapy [address] [place], [place], [address] Phone: [phone]   Fax:  [phone]  Name: Dorothy Wood MRN: [id number] Date of Birth: 1961/07/11

## 2019-11-02 ENCOUNTER — Encounter: Payer: 59 | Admitting: Physical Therapy

## 2019-11-04 MED FILL — guanFACINE HCL 2 MG TABS: 2 | 30 days supply | Qty: 60 | Fill #4

## 2019-11-07 ENCOUNTER — Encounter: Payer: Self-pay | Admitting: Physical Therapy

## 2019-11-07 ENCOUNTER — Other Ambulatory Visit: Payer: Self-pay

## 2019-11-07 ENCOUNTER — Ambulatory Visit (INDEPENDENT_AMBULATORY_CARE_PROVIDER_SITE_OTHER): Payer: 59 | Admitting: Physical Therapy

## 2019-11-07 DIAGNOSIS — R29898 Other symptoms and signs involving the musculoskeletal system: Secondary | ICD-10-CM

## 2019-11-07 DIAGNOSIS — M79604 Pain in right leg: Secondary | ICD-10-CM

## 2019-11-07 DIAGNOSIS — M6281 Muscle weakness (generalized): Secondary | ICD-10-CM

## 2019-11-07 MED FILL — BuPROPion HCL ER (XL) 300 M: 300 | 30 days supply | Qty: 30 | Fill #4

## 2019-11-07 NOTE — Therapy (Signed)
[facility] Physical Therapy [address] [place], [place], [address] Phone: [phone]   Fax:  [phone]  Physical Therapy Treatment  Patient Details  Name: Dorothy Wood MRN: [id number] Date of Birth: 1961-05-25 Referring Provider (PT): Eunice Blase, MD   Encounter Date: 11/07/2019  PT End of Session - 11/07/19 0843    Visit Number  4    Number of Visits  12    Date for PT Re-Evaluation  12/04/19    PT Start Time  0800    PT Stop Time  0842    PT Time Calculation (min)  42 min    Activity Tolerance  Patient tolerated treatment well    Behavior During Therapy  [facility] for tasks assessed/performed       Past Medical History:  Diagnosis Date  . Cancer (HCC)    OVARIAN  . Colonic polyp   . Hyperlipidemia   . Hypertension   . Sjogren's disease ([place])   . Thyroid disease    HYPOTHYROIDISM    Past Surgical History:  Procedure Laterality Date  . ABDOMINAL HYSTERECTOMY     Ovarian cancer  . BREAST CYST ASPIRATION    . CESAREAN SECTION     x2  . COLONOSCOPY  10-06   Dr. Earnest Bailey  . TONSILLECTOMY      There were no vitals filed for this visit.  Subjective Assessment - 11/07/19 0801    Subjective  leg feels much better; still having pain with rolling over    How long can you sit comfortably?  pain with getting up    Patient Stated Goals  learn stretching, improve strength    Currently in Pain?  No/denies    Pain Onset  More than a month ago                       [facility] Adult PT Treatment/Exercise - 11/07/19 0805      Knee/Hip Exercises: Stretches   Passive Hamstring Stretch  Right;Left;2 reps;30 seconds    Passive Hamstring Stretch Limitations  supine with strap    ITB Stretch  Right;2 reps;30 seconds    ITB Stretch Limitations  standing    Piriformis Stretch  Right;3 reps;30 seconds    Other Knee/Hip Stretches  happy baby stretch Rt 2x30 sec      Knee/Hip Exercises: Aerobic   Recumbent Bike  L3 x 6 min      Modalities    Modalities  Iontophoresis      Iontophoresis   Type of Iontophoresis  Dexamethasone    Location  Rt hip/greater trochanter    Dose  1 cc    Time  6 hour patch      Manual Therapy   Manual Therapy  Soft tissue mobilization    Manual therapy comments  skilled palpation and monitoring of soft tissue during DN    Soft tissue mobilization  STM to Rt glutes       Trigger Point Dry Needling - 11/07/19 0841    Consent Given?  Yes    Education Handout Provided  Previously provided    Muscles Treated Back/Hip  Gluteus minimus    Gluteus Minimus Response  Twitch response elicited;Palpable increased muscle length           PT Education - 11/07/19 0842    Education Details  added to HEP, ionto    Person(s) Educated  Patient    Methods  Explanation;Demonstration;Handout    Comprehension  Verbalized understanding;Returned demonstration;Need further  instruction          PT Long Term Goals - 10/23/19 1320      PT LONG TERM GOAL #1   Title  independent with HEP    Status  New    Target Date  12/04/19      PT LONG TERM GOAL #2   Title  report decrease in pain by 50% for improved function and decreased work interruptions    Status  New    Target Date  12/04/19      PT LONG TERM GOAL #3   Title  demonstrate at least 4/5 LLE strength for improved function    Status  New    Target Date  12/04/19            Plan - 11/07/19 0843    Clinical Impression Statement  Pt with positive response to hamstring DN over past sessions, with near resolve of hamstring pain.  Pain decreased with glute med DN and manual therapy today with decreased pain with Lt side rolling.  Trial of ionto today to see if she has some benefit.    Personal Factors and Comorbidities  Comorbidity 3+    Comorbidities  hx Ovarian cancer, Sjogren's disease, HTN    Examination-Activity Limitations  Bathing;Transfers;Locomotion Level;Squat;Stand;Stairs;Carry    Examination-Participation Restrictions  Other    occupation   Stability/Clinical Decision Making  Evolving/Moderate complexity    Rehab Potential  Good    PT Frequency  2x / week    PT Duration  6 weeks    PT Treatment/Interventions  ADLs/Self Care Home Management;Cryotherapy;Electrical Stimulation;Ultrasound;Moist Heat;Iontophoresis 4mg /ml Dexamethasone;Gait training;Stair training;Functional mobility training;Therapeutic activities;Therapeutic exercise;Patient/family education;Neuromuscular re-education;Passive range of motion;Dry needling;Taping;Manual techniques    PT Next Visit Plan  review HEP, DN to glutes/piriformis/hamstrings, manual/modalities PRN, work on strength as able; assess response to DN and ionto    PT Home Exercise Plan  Access Code: [email or web address]    Consulted and Agree with Plan of Care  Patient       Patient will benefit from skilled therapeutic intervention in order to improve the following deficits and impairments:  Increased fascial restricitons, Increased muscle spasms, Pain, Decreased mobility, Decreased strength, Impaired flexibility  Visit Diagnosis: Pain in right leg  Other symptoms and signs involving the musculoskeletal system  Muscle weakness (generalized)     Problem List Patient Active Problem List   Diagnosis Date Noted  . Hormone replacement therapy (HRT) 09/09/2016  . Hypothyroidism 07/16/2014  . Sjogren's syndrome ([place]) 04/11/2013  . History of ovarian cancer 04/11/2013  . Personal history of colonic polyps 04/11/2013  . ADD (attention deficit disorder) 07/06/2012  . Dysthymia 07/06/2012      Laureen Abrahams, PT, DPT 11/07/19 8:48 AM    [facility] Physical Therapy [address] [place], [place], [address] Phone: [phone]   Fax:  [phone]  Name: Dorothy Wood MRN: [id number] Date of Birth: [birth year]

## 2019-11-07 NOTE — Patient Instructions (Addendum)
IONTOPHORESIS PATIENT PRECAUTIONS & CONTRAINDICATIONS:  . Redness under one or both electrodes can occur.  This characterized by a uniform redness that usually disappears within 12 hours of treatment. . Small pinhead size blisters may result in response to the drug.  Contact your physician if the problem persists more than 24 hours. . On rare occasions, iontophoresis therapy can result in temporary skin reactions such as rash, inflammation, irritation or burns.  The skin reactions may be the result of individual sensitivity to the ionic solution used, the condition of the skin at the start of treatment, reaction to the materials in the electrodes, allergies or sensitivity to dexamethasone, or a poor connection between the patch and your skin.  Discontinue using iontophoresis if you have any of these reactions and report to your therapist. . Remove the Patch or electrodes if you have any undue sensation of pain or burning during the treatment and report discomfort to your therapist. . Tell your Therapist if you have had known adverse reactions to the application of electrical current. . If using the Patch, the LED light will turn off when treatment is complete and the patch can be removed.  Approximate treatment time is 1-3 hours.  Remove the patch when light goes off or after 6 hours. . The Patch can be worn during normal activity, however excessive motion where the electrodes have been placed can cause poor contact between the skin and the electrode or uneven electrical current resulting in greater risk of skin irritation. Marland Kitchen Keep out of the reach of children.   . DO NOT use if you have a cardiac pacemaker or any other electrically sensitive implanted device. . DO NOT use if you have a known sensitivity to dexamethasone. . DO NOT use during Magnetic Resonance Imaging (MRI). . DO NOT use over broken or compromised skin (e.g. sunburn, cuts, or acne) due to the increased risk of skin reaction. . DO  NOT SHAVE over the area to be treated:  To establish good contact between the Patch and the skin, excessive hair may be clipped. . DO NOT place the Patch or electrodes on or over your eyes, directly over your heart, or brain. . DO NOT reuse the Patch or electrodes as this may cause burns to occur.  Access Code: [email or web address]  URL: [email or web address]  Date: 11/07/2019  Prepared by: Faustino Congress   Exercises Supine Hamstring Stretch with Strap - 3 reps - 1 sets - 30 sec hold - 2x daily - 7x weekly Supine Piriformis Stretch with Foot on Ground - 3 reps - 1 sets - 30 sec hold - 2x daily - 7x weekly Prone on Elbows Stretch - 1 reps - 1 sets - 3 min hold - 2x daily - 7x weekly Prone Press Up on Elbows - 10 reps - 1 sets - 10 sec hold - 2x daily - 7x weekly Supine Pelvic Floor Stretch - 3 reps - 1 sets - 20 sec hold - 2x daily - 7x weekly Patient Education Trigger Point Dry Needling

## 2019-11-09 ENCOUNTER — Encounter: Payer: Self-pay | Admitting: Physical Therapy

## 2019-11-09 ENCOUNTER — Other Ambulatory Visit: Payer: Self-pay

## 2019-11-09 ENCOUNTER — Ambulatory Visit: Payer: 59 | Admitting: Physical Therapy

## 2019-11-09 DIAGNOSIS — F419 Anxiety disorder, unspecified: Secondary | ICD-10-CM | POA: Diagnosis not present

## 2019-11-09 DIAGNOSIS — R29898 Other symptoms and signs involving the musculoskeletal system: Secondary | ICD-10-CM

## 2019-11-09 DIAGNOSIS — M6281 Muscle weakness (generalized): Secondary | ICD-10-CM

## 2019-11-09 DIAGNOSIS — F902 Attention-deficit hyperactivity disorder, combined type: Secondary | ICD-10-CM | POA: Diagnosis not present

## 2019-11-09 DIAGNOSIS — M79604 Pain in right leg: Secondary | ICD-10-CM | POA: Diagnosis not present

## 2019-11-09 NOTE — Therapy (Signed)
[facility] Physical Therapy [address] [place], [place], [address] Phone: [phone]   Fax:  [phone]  Physical Therapy Treatment  Patient Details  Name: Dorothy Wood MRN: [id number] Date of Birth: 03-23-1961 Referring Provider (PT): Eunice Blase, MD   Encounter Date: 11/09/2019  PT End of Session - 11/09/19 1611    Visit Number  5    Number of Visits  12    Date for PT Re-Evaluation  12/04/19    PT Start Time  1525    PT Stop Time  1607    PT Time Calculation (min)  42 min    Activity Tolerance  Patient tolerated treatment well    Behavior During Therapy  [facility] for tasks assessed/performed       Past Medical History:  Diagnosis Date  . Cancer (HCC)    OVARIAN  . Colonic polyp   . Hyperlipidemia   . Hypertension   . Sjogren's disease ([place])   . Thyroid disease    HYPOTHYROIDISM    Past Surgical History:  Procedure Laterality Date  . ABDOMINAL HYSTERECTOMY     Ovarian cancer  . BREAST CYST ASPIRATION    . CESAREAN SECTION     x2  . COLONOSCOPY  10-06   Dr. Earnest Bailey  . TONSILLECTOMY      There were no vitals filed for this visit.  Subjective Assessment - 11/09/19 1526    Subjective  still having pain down the posterior/lateral Rt thigh    How long can you sit comfortably?  pain with getting up    Patient Stated Goals  learn stretching, improve strength    Currently in Pain?  No/denies    Pain Onset  More than a month ago                       [facility] Adult PT Treatment/Exercise - 11/09/19 1528      Exercises   Exercises  Lumbar      Lumbar Exercises: Stretches   Prone on Elbows Stretch  3 reps;60 seconds   continuous   Press Ups  10 reps;10 seconds    ITB Stretch  Right;3 reps;30 seconds    Piriformis Stretch  Right;3 reps;30 seconds      Knee/Hip Exercises: Aerobic   Recumbent Bike  L3 x 6 min      Knee/Hip Exercises: Standing   Other Standing Knee Exercises  Rt lateral shift correction 10x10 sec hold       Knee/Hip Exercises: Sidelying   Clams  Rt with green theraband x15 reps; reverse clam x 15 reps      Modalities   Modalities  Iontophoresis      Iontophoresis   Type of Iontophoresis  Dexamethasone    Location  Rt hip/greater trochanter    Dose  1 cc    Time  6 hour patch                  PT Long Term Goals - 10/23/19 1320      PT LONG TERM GOAL #1   Title  independent with HEP    Status  New    Target Date  12/04/19      PT LONG TERM GOAL #2   Title  report decrease in pain by 50% for improved function and decreased work interruptions    Status  New    Target Date  12/04/19      PT LONG TERM GOAL #3  Title  demonstrate at least 4/5 LLE strength for improved function    Status  New    Target Date  12/04/19            Plan - 11/09/19 1612    Clinical Impression Statement  Pt continues to have pain in Rt glute radiating into RLE and feel piriformis likely cause of pain as it's reproduced with external rotation and near insertion piriformis.  Will continue to benefit from PT to maximize function.    Personal Factors and Comorbidities  Comorbidity 3+    Comorbidities  hx Ovarian cancer, Sjogren's disease, HTN    Examination-Activity Limitations  Bathing;Transfers;Locomotion Level;Squat;Stand;Stairs;Carry    Examination-Participation Restrictions  Other   occupation   Stability/Clinical Decision Making  Evolving/Moderate complexity    Rehab Potential  Good    PT Frequency  2x / week    PT Duration  6 weeks    PT Treatment/Interventions  ADLs/Self Care Home Management;Cryotherapy;Electrical Stimulation;Ultrasound;Moist Heat;Iontophoresis 4mg /ml Dexamethasone;Gait training;Stair training;Functional mobility training;Therapeutic activities;Therapeutic exercise;Patient/family education;Neuromuscular re-education;Passive range of motion;Dry needling;Taping;Manual techniques    PT Next Visit Plan  review HEP, DN to glutes/piriformis/hamstrings,  manual/modalities PRN, work on strength as able; assess response to ionto (#2 today)    PT Home Exercise Plan  Access Code: [email or web address]    Consulted and Agree with Plan of Care  Patient       Patient will benefit from skilled therapeutic intervention in order to improve the following deficits and impairments:  Increased fascial restricitons, Increased muscle spasms, Pain, Decreased mobility, Decreased strength, Impaired flexibility  Visit Diagnosis: Pain in right leg  Other symptoms and signs involving the musculoskeletal system  Muscle weakness (generalized)     Problem List Patient Active Problem List   Diagnosis Date Noted  . Hormone replacement therapy (HRT) 09/09/2016  . Hypothyroidism 07/16/2014  . Sjogren's syndrome ([place]) 04/11/2013  . History of ovarian cancer 04/11/2013  . Personal history of colonic polyps 04/11/2013  . ADD (attention deficit disorder) 07/06/2012  . Dysthymia 07/06/2012      Laureen Abrahams, PT, DPT 11/09/19 4:14 PM     [facility] [address] [place], [place], [address] Phone: [phone]   Fax:  [phone]  Name: Dorothy Wood MRN: [id number] Date of Birth: March 17, 1961

## 2019-11-10 ENCOUNTER — Encounter: Payer: 59 | Admitting: Physical Therapy

## 2019-11-12 ENCOUNTER — Encounter: Payer: Self-pay | Admitting: Family Medicine

## 2019-11-13 ENCOUNTER — Encounter: Payer: Self-pay | Admitting: Family Medicine

## 2019-11-13 ENCOUNTER — Ambulatory Visit: Payer: 59 | Admitting: Physical Therapy

## 2019-11-13 ENCOUNTER — Encounter: Payer: Self-pay | Admitting: Physical Therapy

## 2019-11-13 ENCOUNTER — Other Ambulatory Visit: Payer: Self-pay

## 2019-11-13 ENCOUNTER — Other Ambulatory Visit: Payer: Self-pay | Admitting: Family Medicine

## 2019-11-13 DIAGNOSIS — M25551 Pain in right hip: Secondary | ICD-10-CM

## 2019-11-13 DIAGNOSIS — M79604 Pain in right leg: Secondary | ICD-10-CM

## 2019-11-13 DIAGNOSIS — R29898 Other symptoms and signs involving the musculoskeletal system: Secondary | ICD-10-CM

## 2019-11-13 DIAGNOSIS — R32 Unspecified urinary incontinence: Secondary | ICD-10-CM

## 2019-11-13 DIAGNOSIS — M6281 Muscle weakness (generalized): Secondary | ICD-10-CM

## 2019-11-13 NOTE — Therapy (Signed)
[facility] [address] [place], [place], [address] Phone: [phone]   Fax:  [phone]  Patient Details  Name: Dorothy Wood MRN: [id number] Date of Birth: 08/01/1961 Referring Provider:  Eunice Blase, MD  Encounter Date: 11/13/2019   Pt arrived to PT session reporting changes in bladder and bowels since pain began.  Reports urinary incontinence and constipation.  Message sent to MD for further recommendations.  Held PT today until after we hear from MD.     Laureen Abrahams, PT, DPT 11/13/19 9:08 AM    [facility] Physical Therapy [address] [place], [place], [address] Phone: [phone]   Fax:  [phone]

## 2019-11-13 NOTE — Progress Notes (Signed)
Doing PT, but having some urinary incontinence.  Will order lumbar MRI.

## 2019-11-15 ENCOUNTER — Ambulatory Visit (INDEPENDENT_AMBULATORY_CARE_PROVIDER_SITE_OTHER): Payer: 59 | Admitting: Physical Therapy

## 2019-11-15 ENCOUNTER — Other Ambulatory Visit: Payer: Self-pay

## 2019-11-15 ENCOUNTER — Encounter: Payer: Self-pay | Admitting: Physical Therapy

## 2019-11-15 DIAGNOSIS — R29898 Other symptoms and signs involving the musculoskeletal system: Secondary | ICD-10-CM | POA: Diagnosis not present

## 2019-11-15 DIAGNOSIS — M6281 Muscle weakness (generalized): Secondary | ICD-10-CM

## 2019-11-15 DIAGNOSIS — M79604 Pain in right leg: Secondary | ICD-10-CM

## 2019-11-15 NOTE — Therapy (Signed)
[facility] [address] [place], [place], [address] Phone: [phone]   Fax:  [phone]  Physical Therapy Treatment  Patient Details  Name: Dorothy Wood MRN: [id number] Date of Birth: 25-Mar-1961 Referring Provider (PT): Eunice Blase, MD   Encounter Date: 11/15/2019  PT End of Session - 11/15/19 0928    Visit Number  6    Number of Visits  12    Date for PT Re-Evaluation  12/04/19    PT Start Time  0810   pt arrived late   PT Stop Time  0843    PT Time Calculation (min)  33 min    Activity Tolerance  Patient tolerated treatment well    Behavior During Therapy  [facility] for tasks assessed/performed       Past Medical History:  Diagnosis Date  . Cancer (HCC)    OVARIAN  . Colonic polyp   . Hyperlipidemia   . Hypertension   . Sjogren's disease ([place])   . Thyroid disease    HYPOTHYROIDISM    Past Surgical History:  Procedure Laterality Date  . ABDOMINAL HYSTERECTOMY     Ovarian cancer  . BREAST CYST ASPIRATION    . CESAREAN SECTION     x2  . COLONOSCOPY  10-06   Dr. Earnest Bailey  . TONSILLECTOMY      There were no vitals filed for this visit.  Subjective Assessment - 11/15/19 0810    Subjective  doing well - MRI scheduled for Saturday    How long can you sit comfortably?  pain with getting up    Patient Stated Goals  learn stretching, improve strength    Currently in Pain?  No/denies    Pain Onset  More than a month ago                       [facility] Adult PT Treatment/Exercise - 11/15/19 0811      Lumbar Exercises: Supine   Ab Set  15 reps;5 seconds    Clam  15 reps    Clam Limitations  green band with ab set    Bridge  10 reps;5 seconds      Knee/Hip Exercises: Stretches   Passive Hamstring Stretch  Right;Left;2 reps;30 seconds    Passive Hamstring Stretch Limitations  Lt side tighter than Rt side      Knee/Hip Exercises: Aerobic   Recumbent Bike  L3 x 6 min                  PT Long  Term Goals - 10/23/19 1320      PT LONG TERM GOAL #1   Title  independent with HEP    Status  New    Target Date  12/04/19      PT LONG TERM GOAL #2   Title  report decrease in pain by 50% for improved function and decreased work interruptions    Status  New    Target Date  12/04/19      PT LONG TERM GOAL #3   Title  demonstrate at least 4/5 LLE strength for improved function    Status  New    Target Date  12/04/19            Plan - 11/15/19 0929    Clinical Impression Statement  Session today focused mainly on hip and core strengthening with no increase in symptoms or pain.  She's having MRI on Saturday and requested to cx Friday's appt  for now.  Will plan to see next week after MRI results.    Personal Factors and Comorbidities  Comorbidity 3+    Comorbidities  hx Ovarian cancer, Sjogren's disease, HTN    Examination-Activity Limitations  Bathing;Transfers;Locomotion Level;Squat;Stand;Stairs;Carry    Examination-Participation Restrictions  Other   occupation   Stability/Clinical Decision Making  Evolving/Moderate complexity    Rehab Potential  Good    PT Frequency  2x / week    PT Duration  6 weeks    PT Treatment/Interventions  ADLs/Self Care Home Management;Cryotherapy;Electrical Stimulation;Ultrasound;Moist Heat;Iontophoresis 4mg /ml Dexamethasone;Gait training;Stair training;Functional mobility training;Therapeutic activities;Therapeutic exercise;Patient/family education;Neuromuscular re-education;Passive range of motion;Dry needling;Taping;Manual techniques    PT Next Visit Plan  review HEP, DN to glutes/piriformis/hamstrings, manual/modalities PRN, work on strength as able;see MRI results    PT Home Exercise Plan  Access Code: [email or web address]    Consulted and Agree with Plan of Care  Patient       Patient will benefit from skilled therapeutic intervention in order to improve the following deficits and impairments:  Increased fascial restricitons, Increased muscle spasms,  Pain, Decreased mobility, Decreased strength, Impaired flexibility  Visit Diagnosis: Pain in right leg  Other symptoms and signs involving the musculoskeletal system  Muscle weakness (generalized)     Problem List Patient Active Problem List   Diagnosis Date Noted  . Hormone replacement therapy (HRT) 09/09/2016  . Hypothyroidism 07/16/2014  . Sjogren's syndrome ([place]) 04/11/2013  . History of ovarian cancer 04/11/2013  . Personal history of colonic polyps 04/11/2013  . ADD (attention deficit disorder) 07/06/2012  . Dysthymia 07/06/2012      Laureen Abrahams, PT, DPT 11/15/19 10:20 AM     [facility] Physical Therapy [address] [place], [place], [address] Phone: [phone]   Fax:  [phone]  Name: Dorothy Wood MRN: [id number] Date of Birth: 04/10/1961

## 2019-11-17 ENCOUNTER — Encounter: Payer: 59 | Admitting: Physical Therapy

## 2019-11-18 ENCOUNTER — Ambulatory Visit (INDEPENDENT_AMBULATORY_CARE_PROVIDER_SITE_OTHER): Payer: 59

## 2019-11-18 ENCOUNTER — Other Ambulatory Visit: Payer: Self-pay

## 2019-11-18 DIAGNOSIS — M545 Low back pain: Secondary | ICD-10-CM | POA: Diagnosis not present

## 2019-11-18 DIAGNOSIS — M25551 Pain in right hip: Secondary | ICD-10-CM | POA: Diagnosis not present

## 2019-11-18 DIAGNOSIS — R32 Unspecified urinary incontinence: Secondary | ICD-10-CM | POA: Diagnosis not present

## 2019-11-18 IMAGING — MR MR LUMBAR SPINE W/O CM
4 of 5 series · 25 of 48 positions shown · non-contrast
Comparison: Prior radiograph from [DATE].

CLINICAL DATA: Initial evaluation for low back pain with bilateral
posterior leg pain and weakness for 2-3 months. Bladder
incontinence.

EXAM:
MRI LUMBAR SPINE WITHOUT CONTRAST
TECHNIQUE: Multiplanar, multisequence MR imaging of the lumbar spine was
performed. No intravenous contrast was administered.

[Series 2: T2 · sagittal · 4.0mm · 0.81mm/px · 6 of 15 slices shown (1 of 2)]
[im 1/15]
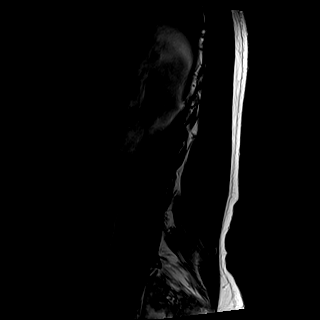
[im 3/15]
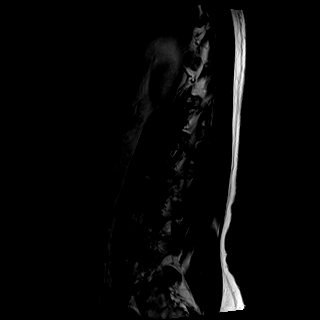
[im 6/15]
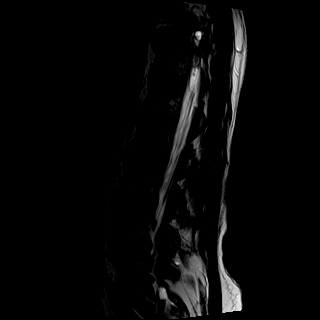
[im 9/15]
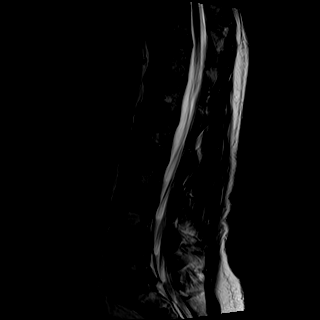
[im 12/15]
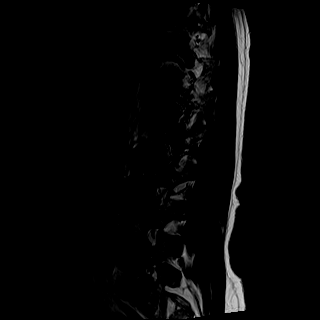
[im 15/15]
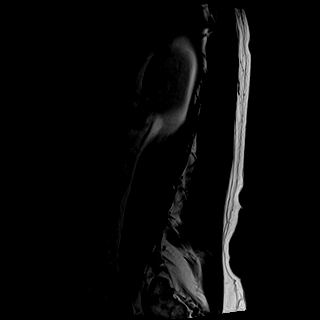

[Series 3: T1 · sagittal · 4.0mm · 0.41mm/px · 6 of 15 slices shown (1 of 2)]
[im 1/15]
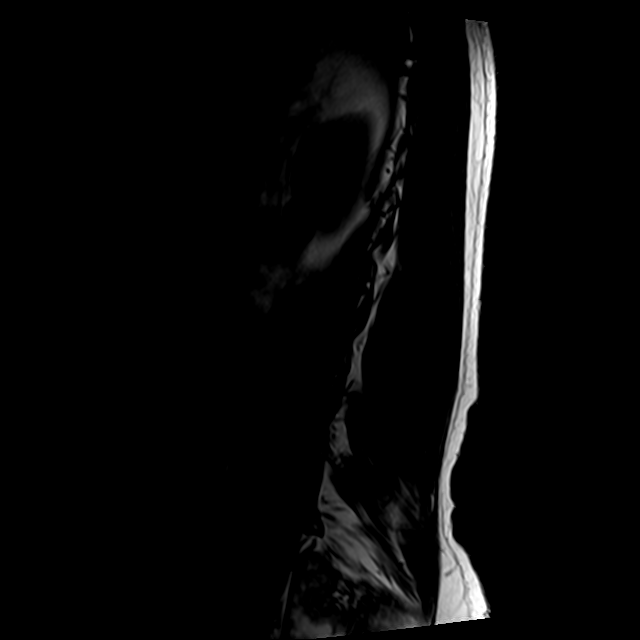
[im 3/15]
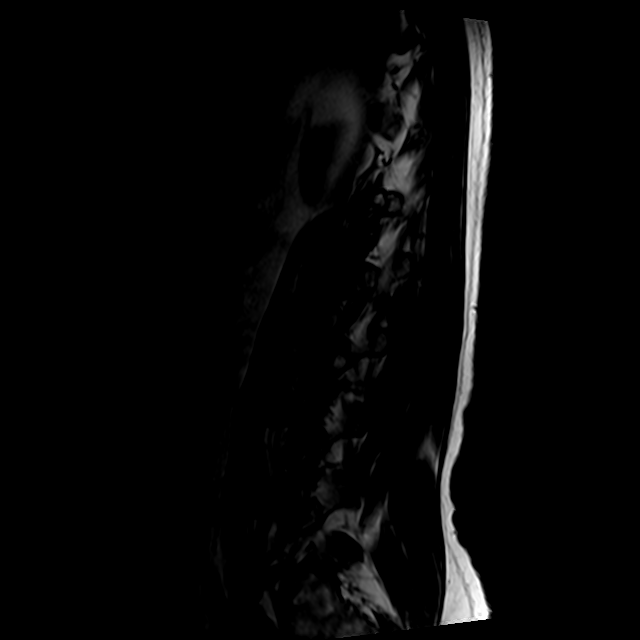
[im 6/15]
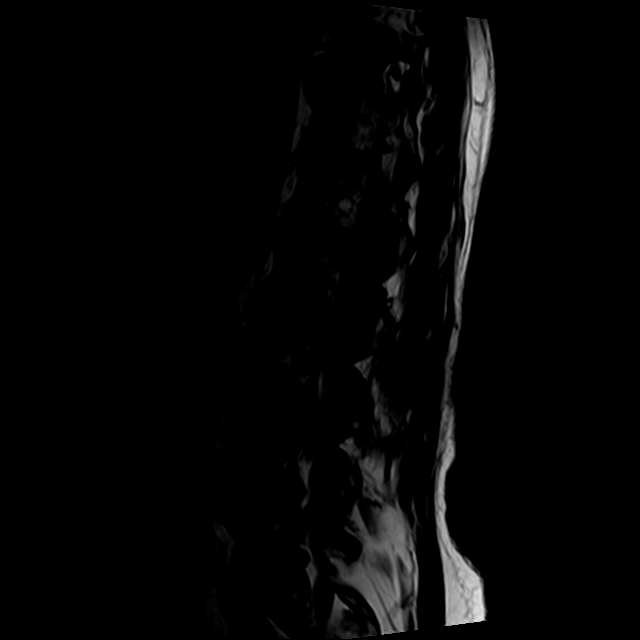
[im 9/15]
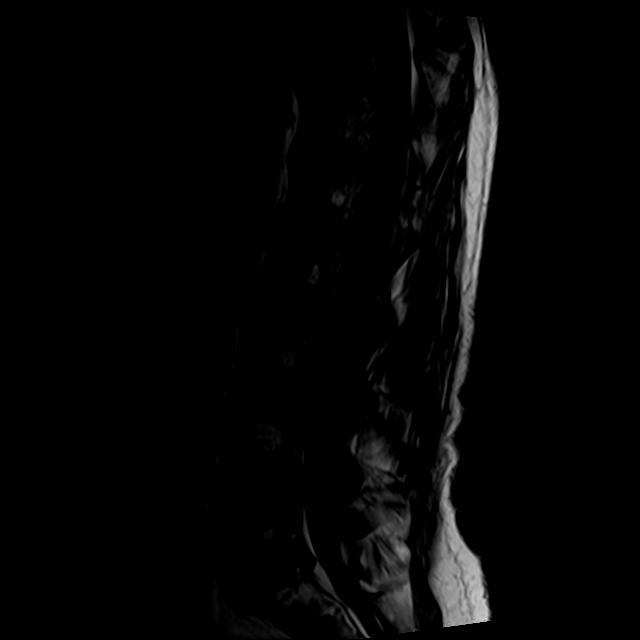
[im 12/15]
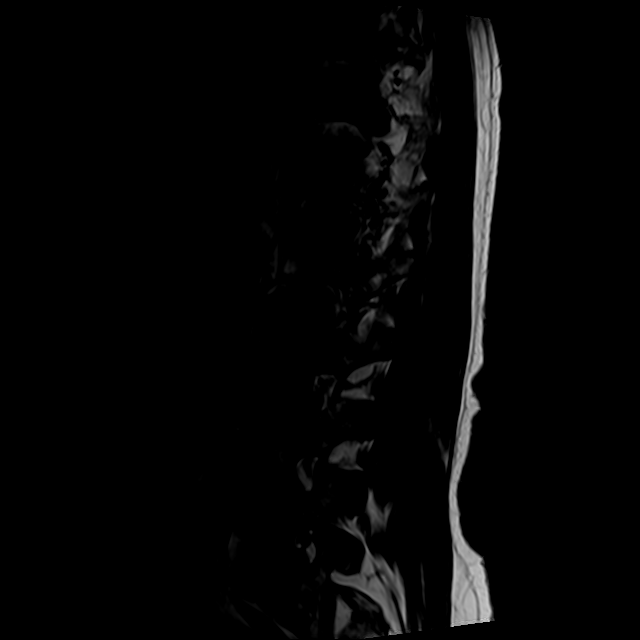
[im 15/15]
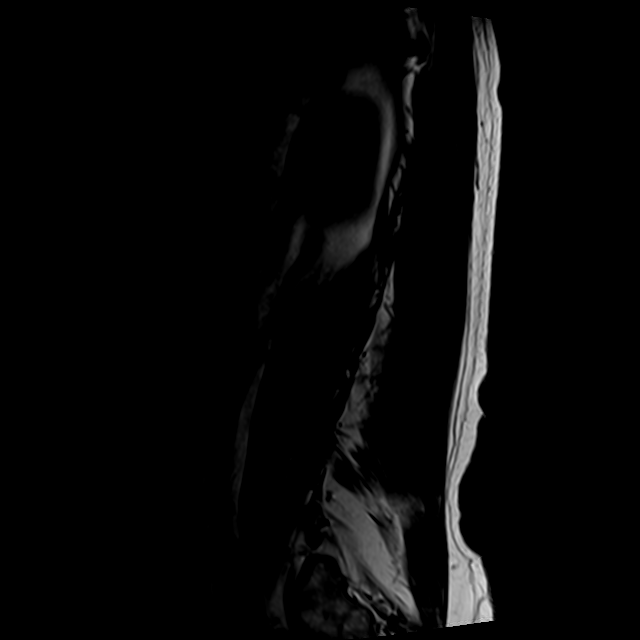

[Series 5: T2 · axial · 4.0mm · 0.78mm/px · z∈[-85,+124]mm · 9 of 39 slices shown (2 of 2)]
[im 1/39]
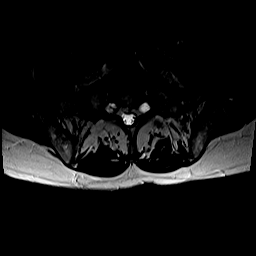
[im 6/39]
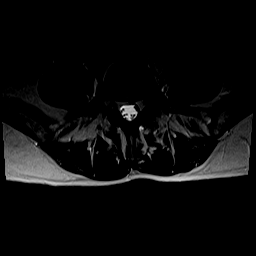
[im 11/39]
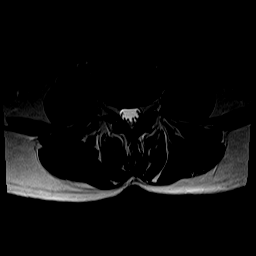
[im 17/39]
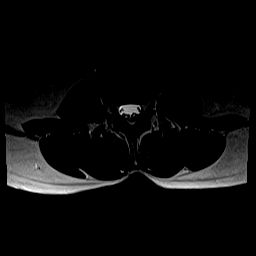
[im 20/39]
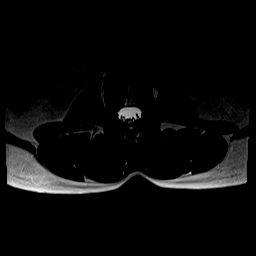
[im 22/39]
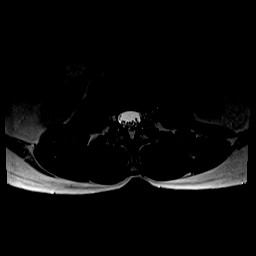
[im 28/39]
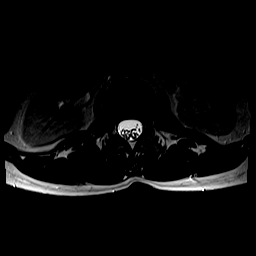
[im 33/39]
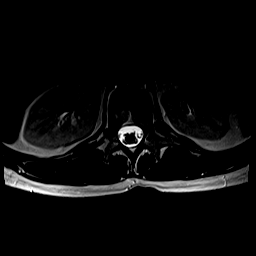
[im 39/39]
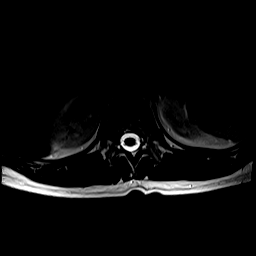

[Series 6: T1 · axial · 4.0mm · 0.39mm/px · z∈[-85,+94]mm · 4 of 39 slices shown (2 of 2)]
[im 1/39]
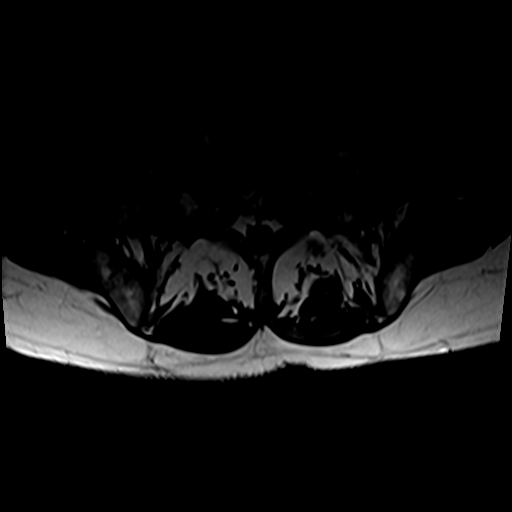
[im 6/39]
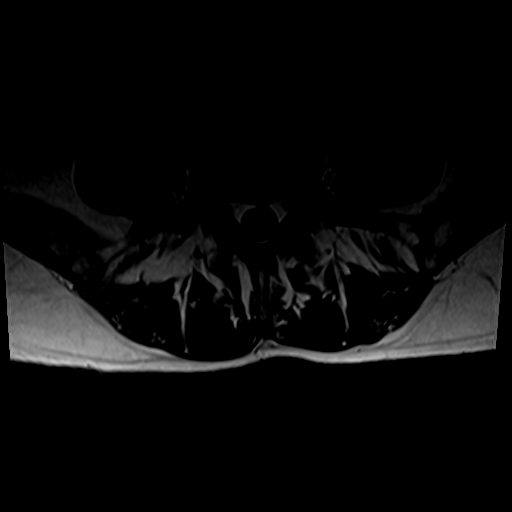
[im 20/39]
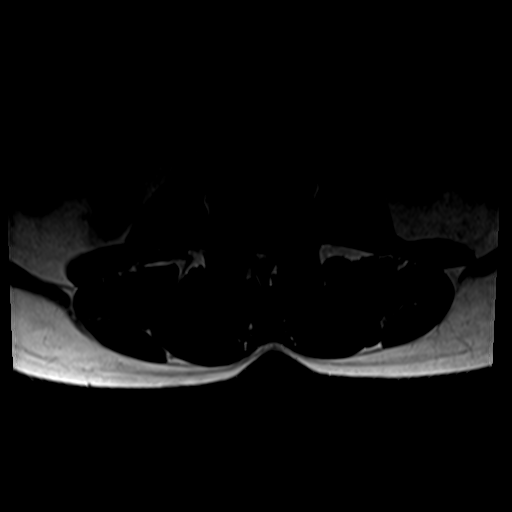
[im 33/39]
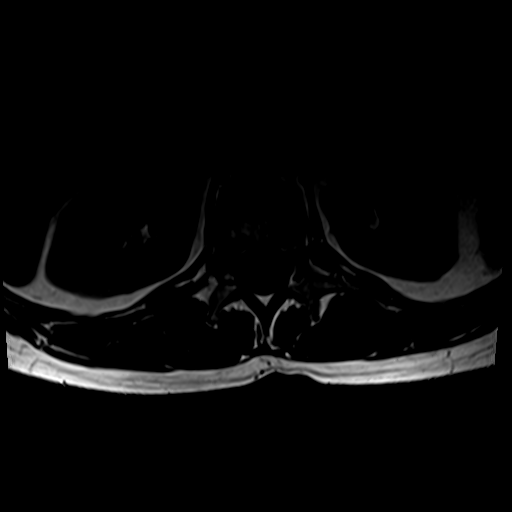

[25 of 48 positions shown; findings below may reference images not displayed]

FINDINGS: Segmentation: Standard. Lowest well-formed disc space labeled the
L5-S1 level.

Alignment:  Physiologic. No listhesis.

Vertebrae: Vertebral body height maintained without evidence for
acute or chronic fracture. Bone marrow signal intensity diffusely
heterogeneous but within normal limits. Few scattered benign
hemangiomata noted. No worrisome osseous lesions. Discogenic
reactive endplate changes present about the L5-S1 interspace. No
other abnormal marrow edema.

Conus medullaris and cauda equina: Conus extends to the L1 level.
Conus and cauda equina appear normal.

Paraspinal and other soft tissues: Paraspinous soft tissues within
normal limits. Visualized visceral structures normal.

Disc levels:

L1-2: Disc desiccation without significant disc bulge. No stenosis
or impingement.

L2-3: Disc desiccation with minimal annular disc bulge. No stenosis
or impingement.

L3-4: Disc desiccation with minimal annular disc bulge. Mild facet
hypertrophy. No significant canal or foraminal stenosis. No
impingement.

L4-5: Disc desiccation without significant disc bulge. Mild to
moderate facet hypertrophy. Mild left L4 foraminal narrowing. No
significant right foraminal stenosis. No significant spinal
stenosis.

L5-S1: Advanced chronic intervertebral disc space narrowing with
diffuse disc bulge and disc desiccation. Reactive endplate changes
with marginal endplate osteophytic spurring. Mild facet hypertrophy.
No significant canal or lateral recess stenosis. Mild right with
moderate left L5 foraminal narrowing.
IMPRESSION: 1. Advanced chronic degenerative disc disease at L5-S1 with
resultant mild right and moderate left L5 foraminal stenosis.
2. Mild left L4 foraminal stenosis related to disc bulge and facet
hypertrophy. No other significant stenosis or evidence for neural
impingement within the lumbar spine.
3. Mild to moderate facet hypertrophy at L3-4 through L5-S1.

## 2019-11-20 ENCOUNTER — Telehealth: Payer: Self-pay | Admitting: Family Medicine

## 2019-11-20 ENCOUNTER — Encounter: Payer: 59 | Admitting: Physical Therapy

## 2019-11-20 NOTE — Telephone Encounter (Signed)
Lumbar MRI scan shows degenerative disc disease at L5-S1 with moderate narrowing of the nerve opening on the left, but no nerve impingement.

## 2019-11-21 ENCOUNTER — Encounter: Payer: Self-pay | Admitting: Physical Therapy

## 2019-11-22 ENCOUNTER — Encounter: Payer: 59 | Admitting: Physical Therapy

## 2019-11-23 ENCOUNTER — Encounter: Payer: Self-pay | Admitting: Physical Therapy

## 2019-11-24 ENCOUNTER — Encounter: Payer: Self-pay | Admitting: Physical Therapy

## 2019-11-27 ENCOUNTER — Encounter: Payer: Self-pay | Admitting: Family Medicine

## 2019-11-27 ENCOUNTER — Encounter: Payer: Self-pay | Admitting: Physical Therapy

## 2019-11-27 MED FILL — LEVOTHYROXINE 88 MCG TABLET: 88 | 90 days supply | Qty: 90 | Fill #2

## 2019-11-28 ENCOUNTER — Encounter: Payer: 59 | Admitting: Rehabilitative and Restorative Service Providers"

## 2019-11-28 ENCOUNTER — Ambulatory Visit: Payer: Self-pay

## 2019-11-28 ENCOUNTER — Ambulatory Visit: Payer: 59 | Admitting: Family Medicine

## 2019-11-28 ENCOUNTER — Encounter: Payer: Self-pay | Admitting: Family Medicine

## 2019-11-28 ENCOUNTER — Other Ambulatory Visit: Payer: Self-pay

## 2019-11-28 DIAGNOSIS — R200 Anesthesia of skin: Secondary | ICD-10-CM

## 2019-11-28 DIAGNOSIS — M542 Cervicalgia: Secondary | ICD-10-CM | POA: Diagnosis not present

## 2019-11-28 NOTE — Progress Notes (Signed)
I saw and examined the patient with Dr. Mayer Masker and agree with assessment and plan as outlined.    Low back pain is much better.  Has been having intermittent right arm numbness since even before last visit, but becoming more noticeable.  Occurs when shrugs right shoulder or leans head to right.  Numb across dorsal radial side of hand.  Neuro exam otherwise non-focal.  X-rays show pronounced degenerative change for age, with retrolisthesis of C4 on C3.    Will try PT with traction and other modalities.  MRI if symptoms worsen.

## 2019-11-28 NOTE — Progress Notes (Addendum)
Dorothy Wood - 59 y.o. female MRN [id number]  Date of birth: 04-17-1961  Office Visit Note: Visit Date: 11/28/2019 PCP: Denita Lung, MD Referred by: Denita Lung, MD  Subjective: Chief Complaint  Patient presents with  . Right Arm - Numbness   HPI: Dorothy Wood is a 59 y.o. female who comes in today with intermittent right arm numbness for the past several months.  She reports that she gets numbness and tingling down her right arm into the dorsal aspect of her 1st-3rd fingers whenever she lifts her shoulders (raises arm in car to place on arm rest) or moves her neck to the right. 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 Did not improve with posture adjustments then changed to a standing desk which helped some. No weakness. No neck pain.  She was seen 3 weeks ago for lower back pain; this has improved with dry needling, PT.   ROS Otherwise per HPI.  Assessment & Plan: Visit Diagnoses:  1. Cervicalgia   2. Right arm numbness     Plan: Right arm numbness likely from nerve impingement with x-rays showing degenerative changes throughout cervical spine, with retrolisthesis at C4 on C3. Will trial PT for traction, strengthening exercises. If symptoms persist, will obtain MRI.    Orders Placed This Encounter  Procedures  . XR Cervical Spine 2 or 3 views  . Ambulatory referral to Physical Therapy    Follow-up: No follow-ups on file.   Procedures: No procedures performed  No notes on file   Clinical History: No specialty comments available.   She reports that she has never smoked. She has never used smokeless tobacco. No results for input(s): HGBA1C, LABURIC in the last 8760 hours.  Objective:  VS:  HT:    WT:   BMI:     BP:   HR: bpm  TEMP: ( )  RESP:  Physical Exam  PHYSICAL EXAM: Gen: NAD, alert, cooperative with exam, well-appearing HEENT: clear conjunctiva,  CV:  no edema, capillary refill brisk, normal rate Resp: non-labored Skin:  no rashes, normal turgor  Neuro: no gross deficits.  Psych:  alert and oriented  Ortho Exam  Neck/Back: - Inspection: no gross deformity or asymmetry, swelling or ecchymosis - Palpation: pressure point at right lateral trapezius muscle - ROM: full active ROM of the cervical spine with neck extension, rotation, flexion - reproduction of symptoms with extension, right lateral bend. Improvement in symptoms with flexion - Strength: 5/5 wrist flexion, extension, biceps flexion. 5/5 triceps extension. OK sign, interosseus strength intact  - Neuro: sensation intact in the C5-C8 nerve root distribution b/l (but with right lateral bend, reports numbness across dorsum of 1st-3rd digits), 2+ C5-C7 reflexes - Special testing:  positive spurling's on right  Imaging: x-rays showing degenerative changes throughout cervical spine, with retrolisthesis at C4 on C3.  Past Medical/Family/Surgical/Social History: Medications & Allergies reviewed per EMR, new medications updated. Patient Active Problem List   Diagnosis Date Noted  . Hormone replacement therapy (HRT) 09/09/2016  . Hypothyroidism 07/16/2014  . Sjogren's syndrome ([place]) 04/11/2013  . History of ovarian cancer 04/11/2013  . Personal history of colonic polyps 04/11/2013  . ADD (attention deficit disorder) 07/06/2012  . Dysthymia 07/06/2012   Past Medical History:  Diagnosis Date  . Cancer (HCC)    OVARIAN  . Colonic polyp   . Hyperlipidemia   . Hypertension   . Sjogren's disease ([place])   . Thyroid disease    HYPOTHYROIDISM  Family History  Problem Relation Age of Onset  . Mental illness Mother   . Hypertension Mother   . Heart failure Mother   . Mental illness Sister   . Mental illness Brother   . Mental illness Maternal Uncle   . Mental illness Maternal Grandmother   . Cancer Paternal Grandmother   . CAD Father        CABG at age 22   Past Surgical History:  Procedure Laterality Date  . ABDOMINAL HYSTERECTOMY     Ovarian  cancer  . BREAST CYST ASPIRATION    . CESAREAN SECTION     x2  . COLONOSCOPY  10-06   Dr. Earnest Bailey  . TONSILLECTOMY     Social History   Occupational History  . Not on file  Tobacco Use  . Smoking status: Never Smoker  . Smokeless tobacco: Never Used  Substance and Sexual Activity  . Alcohol use: Yes    Comment: 6 glasses per week  . Drug use: No  . Sexual activity: Not Currently

## 2019-11-29 ENCOUNTER — Encounter: Payer: Self-pay | Admitting: Physical Therapy

## 2019-11-29 ENCOUNTER — Ambulatory Visit (INDEPENDENT_AMBULATORY_CARE_PROVIDER_SITE_OTHER): Payer: 59 | Admitting: Physical Therapy

## 2019-11-29 DIAGNOSIS — R29898 Other symptoms and signs involving the musculoskeletal system: Secondary | ICD-10-CM | POA: Diagnosis not present

## 2019-11-29 DIAGNOSIS — M6281 Muscle weakness (generalized): Secondary | ICD-10-CM | POA: Diagnosis not present

## 2019-11-29 DIAGNOSIS — M79604 Pain in right leg: Secondary | ICD-10-CM | POA: Diagnosis not present

## 2019-11-29 NOTE — Therapy (Signed)
[facility] Physical Therapy [address] [place], [place], [address] Phone: [phone]   Fax:  [phone]  Physical Therapy Re-Evaluation  Patient Details  Name: Dorothy Wood MRN: [id number] Date of Birth: 1961/04/28 Referring Provider (PT): Eunice Blase, MD   Encounter Date: 11/29/2019  PT End of Session - 11/29/19 0912    Visit Number  7    Number of Visits  15    Date for PT Re-Evaluation  12/27/19    PT Start Time  0843    PT Stop Time  0925    PT Time Calculation (min)  42 min    Activity Tolerance  Patient tolerated treatment well    Behavior During Therapy  [facility] for tasks assessed/performed       Past Medical History:  Diagnosis Date  . Cancer (HCC)    OVARIAN  . Colonic polyp   . Hyperlipidemia   . Hypertension   . Sjogren's disease ([place])   . Thyroid disease    HYPOTHYROIDISM    Past Surgical History:  Procedure Laterality Date  . ABDOMINAL HYSTERECTOMY     Ovarian cancer  . BREAST CYST ASPIRATION    . CESAREAN SECTION     x2  . COLONOSCOPY  10-06   Dr. Earnest Bailey  . TONSILLECTOMY      There were no vitals filed for this visit.   Subjective Assessment - 11/29/19 0844    Subjective  "he said my spine looks like an elderly person"  RUE is going numb with compression  - increased with Rt side bending and shoulder shrug.    Patient Stated Goals  learn stretching, improve strength    Currently in Pain?  No/denies         [facility] PT Assessment - 11/29/19 0846      Assessment   Medical Diagnosis  M25.551 (ICD-10-CM) - Pain in right hip; M54.2 (ICD-10-CM) - Cervicalgia    Referring Provider (PT)  Hilts, Michael, MD      AROM   AROM Assessment Site  Cervical    Cervical Flexion  46    Cervical Extension  32    Cervical - Right Side Bend  26    Cervical - Left Side Bend  30      Special Tests    Special Tests  Cervical    Cervical Tests  Spurling's;Dictraction      Spurling's   Findings  Positive    Side  Right    Comment  increased numbness      Distraction Test   Findngs  Positive    side  Right    Comment  symptoms resolved                Objective measurements completed on examination: See above findings.      [facility] Adult PT Treatment/Exercise - 11/29/19 0904      Exercises   Exercises  Neck      Neck Exercises: Seated   Neck Retraction  1 rep;5 secs    Neck Retraction Limitations  cues for technique - instructed to perform with head support or supine    Shoulder Rolls  Backwards;10 reps    Other Seated Exercise  scap retraction x 5 reps      Modalities   Modalities  Traction      Traction   Type of Traction  Cervical    Min (lbs)  13    Max (lbs)  18    Hold Time  60  Rest Time  20    Time  15      Neck Exercises: Stretches   Other Neck Stretches  instructed in mid doorway stretch - pt verbalized understanding             PT Education - 11/29/19 0912    Education Details  cervical HEP    Person(s) Educated  Patient    Methods  Explanation;Demonstration;Handout    Comprehension  Verbalized understanding;Returned demonstration;Need further instruction          PT Long Term Goals - 11/29/19 0912      PT LONG TERM GOAL #1   Title  independent with HEP    Status  On-going    Target Date  12/27/19      PT LONG TERM GOAL #2   Title  report decrease in pain by 50% for improved function and decreased work interruptions    Status  On-going    Target Date  12/27/19      PT LONG TERM GOAL #3   Title  demonstrate at least 4/5 LLE strength for improved function    Status  On-going    Target Date  12/27/19      PT LONG TERM GOAL #4   Title  report 70% improvement in RUE symptoms for improved function    Status  New    Target Date  12/27/19             Plan - 11/29/19 0913    Clinical Impression Statement  Re-eval and recert completed today to address new diagnosis of cervicalgia and to continue previous goals.  Pt with improvement in low  back and hamstring pain at this time, and trial of cervical traction performed today to help with radiculopathy.  Pt will benefit from PT to continue to address deficits listed.    Personal Factors and Comorbidities  Comorbidity 3+    Comorbidities  hx Ovarian cancer, Sjogren's disease, HTN    Examination-Activity Limitations  Bathing;Transfers;Locomotion Level;Squat;Stand;Stairs;Carry    Examination-Participation Restrictions  Other   occupation   Stability/Clinical Decision Making  Evolving/Moderate complexity    Rehab Potential  Good    PT Frequency  2x / week    PT Duration  4 weeks    PT Treatment/Interventions  ADLs/Self Care Home Management;Cryotherapy;Electrical Stimulation;Ultrasound;Moist Heat;Iontophoresis 4mg /ml Dexamethasone;Gait training;Stair training;Functional mobility training;Therapeutic activities;Therapeutic exercise;Patient/family education;Neuromuscular re-education;Passive range of motion;Dry needling;Taping;Manual techniques    PT Next Visit Plan  review HEP, DN to glutes/piriformis/hamstrings, manual/modalities PRN, assess response to traction and review cervical HEP    PT Home Exercise Plan  Access Code: [email or web address]    Consulted and Agree with Plan of Care  Patient       Patient will benefit from skilled therapeutic intervention in order to improve the following deficits and impairments:  Increased fascial restricitons, Increased muscle spasms, Pain, Decreased mobility, Decreased strength, Impaired flexibility  Visit Diagnosis: Pain in right leg - Plan: PT plan of care cert/re-cert  Other symptoms and signs involving the musculoskeletal system - Plan: PT plan of care cert/re-cert  Muscle weakness (generalized) - Plan: PT plan of care cert/re-cert     Problem List Patient Active Problem List   Diagnosis Date Noted  . Hormone replacement therapy (HRT) 09/09/2016  . Hypothyroidism 07/16/2014  . Sjogren's syndrome ([place]) 04/11/2013  . History of ovarian cancer  04/11/2013  . Personal history of colonic polyps 04/11/2013  . ADD (attention deficit disorder) 07/06/2012  . Dysthymia 07/06/2012  Laureen Abrahams, PT, DPT 11/29/19 2:43 PM     [facility] Physical Therapy [address] [place], [place], [address] Phone: [phone]   Fax:  [phone]  Name: JAZIRAH TRIANO MRN: [id number] Date of Birth: [birth year]

## 2019-11-29 NOTE — Patient Instructions (Signed)
Access Code: [email or web address] URL: [email or web address] Date: 11/29/2019 Prepared by: Faustino Congress  Exercises Supine Hamstring Stretch with Strap - 2 x daily - 7 x weekly - 3 reps - 1 sets - 30 sec hold Supine Piriformis Stretch with Foot on Ground - 2 x daily - 7 x weekly - 3 reps - 1 sets - 30 sec hold Right Standing Lateral Shift Correction at Wall - Repetitions - 2 x daily - 7 x weekly - 10 reps - 1 sets - 10 sec hold Prone on Elbows Stretch - 2 x daily - 7 x weekly - 1 reps - 1 sets - 3 min hold Prone Press Up on Elbows - 2 x daily - 7 x weekly - 10 reps - 1 sets - 10 sec hold Supine Pelvic Floor Stretch - 2 x daily - 7 x weekly - 3 reps - 1 sets - 20 sec hold ITB Stretch at Wall - 2 x daily - 7 x weekly - 3 reps - 1 sets - 30 sec hold Supine Chin Tuck - 2 x daily - 7 x weekly - 10 reps - 1 sets - 5 sec hold Seated Scapular Retraction - 2 x daily - 7 x weekly - 10 reps - 1 sets - 5 sec hold Standing Backward Shoulder Rolls - 2 x daily - 7 x weekly - 10 reps - 1 sets Doorway Pec Stretch at 60 Elevation - 2 x daily - 7 x weekly - 3 reps - 1 sets - 30 sec hold  Patient Education Trigger Point Dry Needling

## 2019-12-04 DIAGNOSIS — Z23 Encounter for immunization: Secondary | ICD-10-CM | POA: Diagnosis not present

## 2019-12-04 DIAGNOSIS — L814 Other melanin hyperpigmentation: Secondary | ICD-10-CM | POA: Diagnosis not present

## 2019-12-04 DIAGNOSIS — L570 Actinic keratosis: Secondary | ICD-10-CM | POA: Diagnosis not present

## 2019-12-04 DIAGNOSIS — L821 Other seborrheic keratosis: Secondary | ICD-10-CM | POA: Diagnosis not present

## 2019-12-04 DIAGNOSIS — R32 Unspecified urinary incontinence: Secondary | ICD-10-CM | POA: Diagnosis not present

## 2019-12-04 MED FILL — guanFACINE HCL 2 MG TABS: 2 | 30 days supply | Qty: 60 | Fill #5

## 2019-12-04 MED FILL — HYDROQUINONE 4 % CREA: 4 | 30 days supply | Qty: 28 | Fill #0

## 2019-12-04 MED FILL — FLUOROURACIL 5 % CREA: 5 | 14 days supply | Qty: 40 | Fill #0

## 2019-12-04 MED FILL — CEPHALEXIN 500 MG CAPSULE: 500 | 4 days supply | Qty: 12 | Fill #0

## 2019-12-05 ENCOUNTER — Other Ambulatory Visit: Payer: Self-pay

## 2019-12-05 ENCOUNTER — Ambulatory Visit: Payer: 59 | Admitting: Physical Therapy

## 2019-12-05 ENCOUNTER — Encounter: Payer: Self-pay | Admitting: Physical Therapy

## 2019-12-05 DIAGNOSIS — R29898 Other symptoms and signs involving the musculoskeletal system: Secondary | ICD-10-CM

## 2019-12-05 DIAGNOSIS — M79604 Pain in right leg: Secondary | ICD-10-CM

## 2019-12-05 DIAGNOSIS — M6281 Muscle weakness (generalized): Secondary | ICD-10-CM | POA: Diagnosis not present

## 2019-12-05 NOTE — Therapy (Signed)
[facility] Physical Therapy [address] [place], [place], [address] Phone: [phone]   Fax:  [phone]  Physical Therapy Treatment  Patient Details  Name: Dorothy Wood MRN: [id number] Date of Birth: 1960-10-20 Referring Provider (PT): Eunice Blase, MD   Encounter Date: 12/05/2019  PT End of Session - 12/05/19 1000    Visit Number  8    Number of Visits  15    Date for PT Re-Evaluation  12/27/19    PT Start Time  0933    PT Stop Time  1011    PT Time Calculation (min)  38 min    Activity Tolerance  Patient tolerated treatment well    Behavior During Therapy  [facility] for tasks assessed/performed       Past Medical History:  Diagnosis Date  . Cancer (HCC)    OVARIAN  . Colonic polyp   . Hyperlipidemia   . Hypertension   . Sjogren's disease ([place])   . Thyroid disease    HYPOTHYROIDISM    Past Surgical History:  Procedure Laterality Date  . ABDOMINAL HYSTERECTOMY     Ovarian cancer  . BREAST CYST ASPIRATION    . CESAREAN SECTION     x2  . COLONOSCOPY  10-06   Dr. Earnest Bailey  . TONSILLECTOMY      There were no vitals filed for this visit.  Subjective Assessment - 12/05/19 0934    Subjective  traction seemed to be helpful. reports arm has been numb all night    Patient Stated Goals  learn stretching, improve strength    Currently in Pain?  No/denies                       [facility] Adult PT Treatment/Exercise - 12/05/19 0937      Neck Exercises: Seated   Cervical Isometrics  Right lateral flexion;5 secs;10 reps      Neck Exercises: Supine   Neck Retraction  10 reps;10 secs   slight cervical extension     Traction   Type of Traction  Cervical    Min (lbs)  13    Max (lbs)  18    Hold Time  60    Rest Time  20    Time  15      Neck Exercises: Stretches   Other Neck Stretches  mid doorway stretch 3x30 sec                  PT Long Term Goals - 11/29/19 0912      PT LONG TERM GOAL #1   Title   independent with HEP    Status  On-going    Target Date  12/27/19      PT LONG TERM GOAL #2   Title  report decrease in pain by 50% for improved function and decreased work interruptions    Status  On-going    Target Date  12/27/19      PT LONG TERM GOAL #3   Title  demonstrate at least 4/5 LLE strength for improved function    Status  On-going    Target Date  12/27/19      PT LONG TERM GOAL #4   Title  report 70% improvement in RUE symptoms for improved function    Status  New    Target Date  12/27/19            Plan - 12/05/19 1002    Clinical Impression Statement  Improvement in  symptoms after traction last session, which returned prior to session today.  Overall low back feels much better, still dealing with numbness into RUE.  Will continue to benefit from PT to maximize function.    Personal Factors and Comorbidities  Comorbidity 3+    Comorbidities  hx Ovarian cancer, Sjogren's disease, HTN    Examination-Activity Limitations  Bathing;Transfers;Locomotion Level;Squat;Stand;Stairs;Carry    Examination-Participation Restrictions  Other   occupation   Stability/Clinical Decision Making  Evolving/Moderate complexity    Rehab Potential  Good    PT Frequency  2x / week    PT Duration  4 weeks    PT Treatment/Interventions  ADLs/Self Care Home Management;Cryotherapy;Electrical Stimulation;Ultrasound;Moist Heat;Iontophoresis 4mg /ml Dexamethasone;Gait training;Stair training;Functional mobility training;Therapeutic activities;Therapeutic exercise;Patient/family education;Neuromuscular re-education;Passive range of motion;Dry needling;Taping;Manual techniques    PT Next Visit Plan  review HEP, DN to glutes/piriformis/hamstrings, manual/modalities PRN, assess response to traction and review cervical HEP    PT Home Exercise Plan  Access Code: [email or web address]    Consulted and Agree with Plan of Care  Patient       Patient will benefit from skilled therapeutic intervention in order to  improve the following deficits and impairments:  Increased fascial restricitons, Increased muscle spasms, Pain, Decreased mobility, Decreased strength, Impaired flexibility  Visit Diagnosis: Pain in right leg  Other symptoms and signs involving the musculoskeletal system  Muscle weakness (generalized)     Problem List Patient Active Problem List   Diagnosis Date Noted  . Hormone replacement therapy (HRT) 09/09/2016  . Hypothyroidism 07/16/2014  . Sjogren's syndrome ([place]) 04/11/2013  . History of ovarian cancer 04/11/2013  . Personal history of colonic polyps 04/11/2013  . ADD (attention deficit disorder) 07/06/2012  . Dysthymia 07/06/2012      Laureen Abrahams, PT, DPT 12/05/19 10:11 AM    [facility] Physical Therapy [address] [place], [place], [address] Phone: [phone]   Fax:  [phone]  Name: Dorothy Wood MRN: [id number] Date of Birth: 28-Dec-1960

## 2019-12-08 MED FILL — buPROPion HCL ER (XL) 300 M: 300 | 30 days supply | Qty: 30 | Fill #5

## 2019-12-09 MED FILL — OXYBUTYNIN CL ER 10 MG TAB: 10 | 30 days supply | Qty: 30 | Fill #0

## 2019-12-13 ENCOUNTER — Other Ambulatory Visit: Payer: Self-pay

## 2019-12-13 ENCOUNTER — Ambulatory Visit: Payer: 59 | Admitting: Physical Therapy

## 2019-12-13 ENCOUNTER — Encounter: Payer: Self-pay | Admitting: Physical Therapy

## 2019-12-13 DIAGNOSIS — M79604 Pain in right leg: Secondary | ICD-10-CM | POA: Diagnosis not present

## 2019-12-13 DIAGNOSIS — R29898 Other symptoms and signs involving the musculoskeletal system: Secondary | ICD-10-CM | POA: Diagnosis not present

## 2019-12-13 DIAGNOSIS — M6281 Muscle weakness (generalized): Secondary | ICD-10-CM | POA: Diagnosis not present

## 2019-12-13 NOTE — Therapy (Signed)
[facility] Physical Therapy [address] [place], [place], [address] Phone: [phone]   Fax:  [phone]  Physical Therapy Treatment  Patient Details  Name: Dorothy Wood MRN: [id number] Date of Birth: 05/22/1961 Referring Provider (PT): Eunice Blase, MD   Encounter Date: 12/13/2019  PT End of Session - 12/13/19 1519    Visit Number  9    Number of Visits  15    Date for PT Re-Evaluation  12/27/19    PT Start Time  1450    PT Stop Time  1530    PT Time Calculation (min)  40 min    Activity Tolerance  Patient tolerated treatment well    Behavior During Therapy  [facility] for tasks assessed/performed       Past Medical History:  Diagnosis Date  . Cancer (HCC)    OVARIAN  . Colonic polyp   . Hyperlipidemia   . Hypertension   . Sjogren's disease ([place])   . Thyroid disease    HYPOTHYROIDISM    Past Surgical History:  Procedure Laterality Date  . ABDOMINAL HYSTERECTOMY     Ovarian cancer  . BREAST CYST ASPIRATION    . CESAREAN SECTION     x2  . COLONOSCOPY  10-06   Dr. Earnest Bailey  . TONSILLECTOMY      There were no vitals filed for this visit.  Subjective Assessment - 12/13/19 1452    Subjective  exercises are getting better; traction is providing longer relief    Patient Stated Goals  learn stretching, improve strength    Currently in Pain?  No/denies                       [facility] Adult PT Treatment/Exercise - 12/13/19 1457      Neck Exercises: Theraband   Shoulder External Rotation  15 reps;Green   on 1/2 foam roll   Horizontal ABduction  15 reps;Green   on 1/2 foam roll     Neck Exercises: Seated   W Back  15 reps   5-10 sec hold   Shoulder Rolls  Backwards;10 reps      Neck Exercises: Supine   Neck Retraction  10 reps;10 secs   on 1/2 foam roll     Neck Exercises: Sidelying   Lateral Flexion  Right;10 reps   isometric     Traction   Type of Traction  Cervical    Min (lbs)  15    Max (lbs)  20    Hold Time   60    Rest Time  20    Time  12      Neck Exercises: Stretches   Other Neck Stretches  supine on 1/2 foam roll x 2-3 min; arms out to side             PT Education - 12/13/19 1518    Education Details  sidelying Rt sidebending isometric    Person(s) Educated  Patient    Methods  Explanation;Demonstration    Comprehension  Verbalized understanding          PT Long Term Goals - 11/29/19 0912      PT LONG TERM GOAL #1   Title  independent with HEP    Status  On-going    Target Date  12/27/19      PT LONG TERM GOAL #2   Title  report decrease in pain by 50% for improved function and decreased work interruptions    Status  On-going    Target Date  12/27/19      PT LONG TERM GOAL #3   Title  demonstrate at least 4/5 LLE strength for improved function    Status  On-going    Target Date  12/27/19      PT LONG TERM GOAL #4   Title  report 70% improvement in RUE symptoms for improved function    Status  New    Target Date  12/27/19            Plan - 12/13/19 1519    Clinical Impression Statement  Continued improvement in centralization of symptoms, and added lateral shift correction in sidelying today to see if longer benefit noted.  Progressing well with current plan.  Will continue.    Personal Factors and Comorbidities  Comorbidity 3+    Comorbidities  hx Ovarian cancer, Sjogren's disease, HTN    Examination-Activity Limitations  Bathing;Transfers;Locomotion Level;Squat;Stand;Stairs;Carry    Examination-Participation Restrictions  Other   occupation   Stability/Clinical Decision Making  Evolving/Moderate complexity    Rehab Potential  Good    PT Frequency  2x / week    PT Duration  4 weeks    PT Treatment/Interventions  ADLs/Self Care Home Management;Cryotherapy;Electrical Stimulation;Ultrasound;Moist Heat;Iontophoresis 4mg /ml Dexamethasone;Gait training;Stair training;Functional mobility training;Therapeutic activities;Therapeutic exercise;Patient/family  education;Neuromuscular re-education;Passive range of motion;Dry needling;Taping;Manual techniques    PT Next Visit Plan  review HEP, DN to glutes/piriformis/hamstrings, manual/modalities PRN, continue with current POC - traction, extension based exercises    PT Home Exercise Plan  Access Code: [email or web address]    Consulted and Agree with Plan of Care  Patient       Patient will benefit from skilled therapeutic intervention in order to improve the following deficits and impairments:  Increased fascial restricitons, Increased muscle spasms, Pain, Decreased mobility, Decreased strength, Impaired flexibility  Visit Diagnosis: Other symptoms and signs involving the musculoskeletal system  Pain in right leg  Muscle weakness (generalized)     Problem List Patient Active Problem List   Diagnosis Date Noted  . Hormone replacement therapy (HRT) 09/09/2016  . Hypothyroidism 07/16/2014  . Sjogren's syndrome ([place]) 04/11/2013  . History of ovarian cancer 04/11/2013  . Personal history of colonic polyps 04/11/2013  . ADD (attention deficit disorder) 07/06/2012  . Dysthymia 07/06/2012      Laureen Abrahams, PT, DPT 12/13/19 3:21 PM    [place] [facility] Physical Therapy [address] [place], [place], [address] Phone: [phone]   Fax:  [phone]  Name: Dorothy Wood MRN: [id number] Date of Birth: 05/16/1961

## 2019-12-14 ENCOUNTER — Encounter: Payer: Self-pay | Admitting: Physical Therapy

## 2019-12-15 ENCOUNTER — Encounter: Payer: Self-pay | Admitting: Physical Therapy

## 2019-12-18 ENCOUNTER — Encounter: Payer: Self-pay | Admitting: Physical Therapy

## 2019-12-21 ENCOUNTER — Encounter: Payer: Self-pay | Admitting: Physical Therapy

## 2019-12-21 ENCOUNTER — Other Ambulatory Visit: Payer: Self-pay

## 2019-12-21 ENCOUNTER — Ambulatory Visit: Payer: 59 | Admitting: Physical Therapy

## 2019-12-21 DIAGNOSIS — R29898 Other symptoms and signs involving the musculoskeletal system: Secondary | ICD-10-CM

## 2019-12-21 DIAGNOSIS — M79604 Pain in right leg: Secondary | ICD-10-CM | POA: Diagnosis not present

## 2019-12-21 DIAGNOSIS — M6281 Muscle weakness (generalized): Secondary | ICD-10-CM

## 2019-12-21 NOTE — Therapy (Addendum)
[facility] Physical Therapy [address] [place], [place], [address] Phone: [phone]   Fax:  [phone]  Physical Therapy Treatment/Discharge Summary  Patient Details  Name: Dorothy Wood MRN: [id number] Date of Birth: 1960-12-26 Referring Provider (PT): Eunice Blase, MD   Encounter Date: 12/21/2019  PT End of Session - 12/21/19 1614    Visit Number  10    Number of Visits  15    Date for PT Re-Evaluation  12/27/19    PT Start Time  6269    PT Stop Time  1610    PT Time Calculation (min)  35 min    Activity Tolerance  Patient tolerated treatment well    Behavior During Therapy  [facility] for tasks assessed/performed       Past Medical History:  Diagnosis Date  . Cancer (HCC)    OVARIAN  . Colonic polyp   . Hyperlipidemia   . Hypertension   . Sjogren's disease ([place])   . Thyroid disease    HYPOTHYROIDISM    Past Surgical History:  Procedure Laterality Date  . ABDOMINAL HYSTERECTOMY     Ovarian cancer  . BREAST CYST ASPIRATION    . CESAREAN SECTION     x2  . COLONOSCOPY  10-06   Dr. Earnest Bailey  . TONSILLECTOMY      There were no vitals filed for this visit.  Subjective Assessment - 12/21/19 1614    Subjective  symptoms seem to be getting worse    Patient Stated Goals  learn stretching, improve strength    Currently in Pain?  Yes   did not rate        [facility] PT Assessment - 12/21/19 1541      Assessment   Medical Diagnosis  M25.551 (ICD-10-CM) - Pain in right hip; M54.2 (ICD-10-CM) - Cervicalgia    Referring Provider (PT)  Hilts, Michael, MD      Strength   Strength Assessment Site  Hand    Right/Left hand  Right;Left    Right Hand Grip (lbs)  54.3   58.7, 54.6, 49.6   Left Hand Grip (lbs)  55.43   54.8, 56.3, 55.2                  [facility] Adult PT Treatment/Exercise - 12/21/19 1613      Self-Care   Self-Care  Other Self-Care Comments    Other Self-Care Comments   discussed current symptoms and condition -  recommended reaching out to MD, PT send MD message and he recommends MRI due to increasing symptoms      Traction   Type of Traction  Cervical    Min (lbs)  15    Max (lbs)  20    Hold Time  60    Rest Time  20    Time  12             PT Education - 12/21/19 1614    Education Details  MD recommendations    Person(s) Educated  Patient    Methods  Explanation    Comprehension  Verbalized understanding          PT Long Term Goals - 11/29/19 0912      PT LONG TERM GOAL #1   Title  independent with HEP    Status  On-going    Target Date  12/27/19      PT LONG TERM GOAL #2   Title  report decrease in pain by 50% for improved function and decreased  work interruptions    Status  On-going    Target Date  12/27/19      PT LONG TERM GOAL #3   Title  demonstrate at least 4/5 LLE strength for improved function    Status  On-going    Target Date  12/27/19      PT LONG TERM GOAL #4   Title  report 70% improvement in RUE symptoms for improved function    Status  New    Target Date  12/27/19            Plan - 12/21/19 1615    Clinical Impression Statement  Pt with worsening symptoms so MD recommending MRI for now. Will hold PT for now until after imaging.    Personal Factors and Comorbidities  Comorbidity 3+    Comorbidities  hx Ovarian cancer, Sjogren's disease, HTN    Examination-Activity Limitations  Bathing;Transfers;Locomotion Level;Squat;Stand;Stairs;Carry    Examination-Participation Restrictions  Other   occupation   Stability/Clinical Decision Making  Evolving/Moderate complexity    Rehab Potential  Good    PT Frequency  2x / week    PT Duration  4 weeks    PT Treatment/Interventions  ADLs/Self Care Home Management;Cryotherapy;Electrical Stimulation;Ultrasound;Moist Heat;Iontophoresis 75m/ml Dexamethasone;Gait training;Stair training;Functional mobility training;Therapeutic activities;Therapeutic exercise;Patient/family education;Neuromuscular  re-education;Passive range of motion;Dry needling;Taping;Manual techniques    PT Next Visit Plan  hold PT until after MRI    PT Home Exercise Plan  Access Code: [email or web address]    Consulted and Agree with Plan of Care  Patient       Patient will benefit from skilled therapeutic intervention in order to improve the following deficits and impairments:  Increased fascial restricitons, Increased muscle spasms, Pain, Decreased mobility, Decreased strength, Impaired flexibility  Visit Diagnosis: Pain in right leg  Other symptoms and signs involving the musculoskeletal system  Muscle weakness (generalized)     Problem List Patient Active Problem List   Diagnosis Date Noted  . Hormone replacement therapy (HRT) 09/09/2016  . Hypothyroidism 07/16/2014  . [place] 04/11/2013  . History of ovarian cancer 04/11/2013  . Personal history of colonic polyps 04/11/2013  . ADD (attention deficit disorder) 07/06/2012  . Dysthymia 07/06/2012      SLaureen Abrahams PT, DPT 12/21/19 4:17 PM   [facility] [address] [address]Phone: [phone]  Fax:  [phone] Name: Dorothy TROUNGMRN: [id number] of Birth: [birth year]    PHYSICAL THERAPY DISCHARGE SUMMARY  Visits from Start of Care: 10  Current functional level related to goals / functional outcomes: See above   Remaining deficits: See above; pt referred back to MD due to worsening symptoms   Education / Equipment: HEP  Plan: Patient agrees to discharge.  Patient goals were not met. Patient is being discharged due to a change in medical status.  ?????    SLaureen Abrahams PT, DPT 02/20/20 9:59 AM  [facility] [address] [address]Phone: [phone]  Fax:  [phone]

## 2019-12-22 ENCOUNTER — Encounter: Payer: Self-pay | Admitting: Physical Therapy

## 2019-12-25 ENCOUNTER — Encounter: Payer: Self-pay | Admitting: Physical Therapy

## 2019-12-25 ENCOUNTER — Other Ambulatory Visit: Payer: Self-pay | Admitting: Family Medicine

## 2019-12-25 ENCOUNTER — Other Ambulatory Visit: Payer: Self-pay | Admitting: Cardiology

## 2019-12-25 DIAGNOSIS — I10 Essential (primary) hypertension: Secondary | ICD-10-CM

## 2019-12-25 DIAGNOSIS — M542 Cervicalgia: Secondary | ICD-10-CM

## 2019-12-25 DIAGNOSIS — M3500 Sicca syndrome, unspecified: Secondary | ICD-10-CM

## 2019-12-25 MED FILL — PILOCARPINE HCL 5 MG TABLET: 5 | 90 days supply | Qty: 360 | Fill #0

## 2019-12-25 NOTE — Telephone Encounter (Signed)
Dorothy Wood long is requesting to fill pt pilocarpine . Please advise [facility]

## 2019-12-26 MED FILL — HYDROCHLOROTHIAZIDE 25 MG T: 25 | 90 days supply | Qty: 90 | Fill #0

## 2019-12-26 MED FILL — ESTROGEN-METHYLTESTOSTERONE: 1.25-2.5 | 90 days supply | Qty: 90 | Fill #0

## 2019-12-27 ENCOUNTER — Encounter: Payer: Self-pay | Admitting: Physical Therapy

## 2019-12-28 DIAGNOSIS — R351 Nocturia: Secondary | ICD-10-CM | POA: Diagnosis not present

## 2019-12-28 DIAGNOSIS — N3941 Urge incontinence: Secondary | ICD-10-CM | POA: Diagnosis not present

## 2019-12-30 ENCOUNTER — Other Ambulatory Visit: Payer: Self-pay

## 2019-12-30 ENCOUNTER — Ambulatory Visit (INDEPENDENT_AMBULATORY_CARE_PROVIDER_SITE_OTHER): Payer: 59

## 2019-12-30 DIAGNOSIS — M542 Cervicalgia: Secondary | ICD-10-CM | POA: Diagnosis not present

## 2019-12-30 DIAGNOSIS — M4802 Spinal stenosis, cervical region: Secondary | ICD-10-CM | POA: Diagnosis not present

## 2019-12-30 IMAGING — MR MR CERVICAL SPINE W/O CM
5 series · 41 of 48 positions shown · non-contrast
Comparison: None.

CLINICAL DATA: Right arm numbness caused by tilting head to the
left.

EXAM:
MRI CERVICAL SPINE WITHOUT CONTRAST
TECHNIQUE: Multiplanar, multisequence MR imaging of the cervical spine was
performed. No intravenous contrast was administered.

[Series 4: T2 · sagittal · 3.0mm · 0.69mm/px · 7 of 13 slices shown (1 of 2)]
[im 1/13]
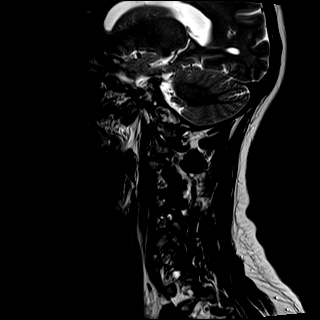
[im 3/13]
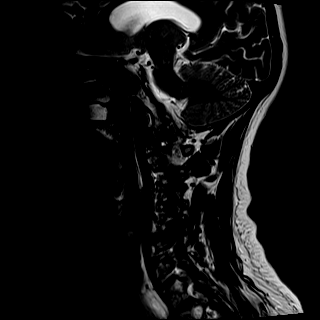
[im 5/13]
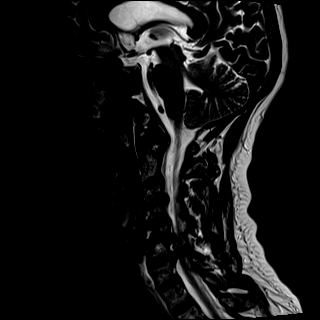
[im 7/13]
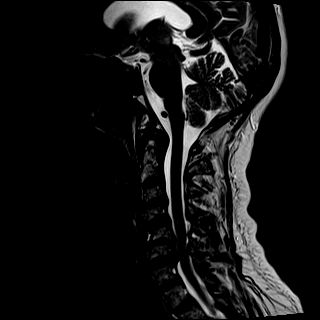
[im 9/13]
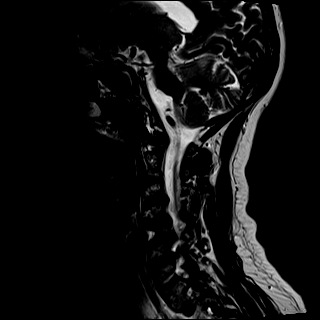
[im 11/13]
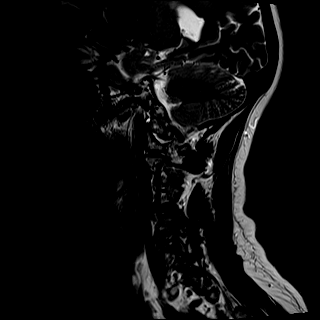
[im 13/13]
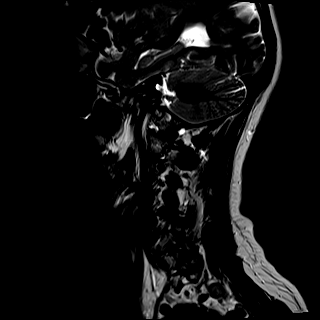

[Series 5: T1 · sagittal · 3.0mm · 0.86mm/px · 7 of 13 slices shown]
[im 1/13]
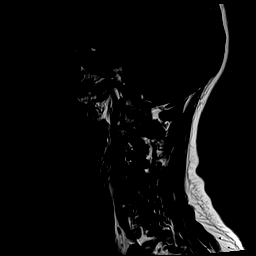
[im 3/13]
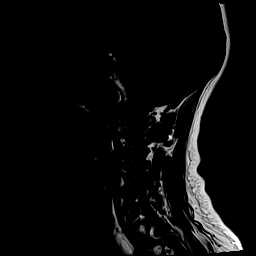
[im 5/13]
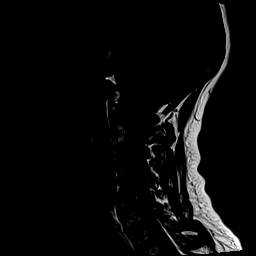
[im 7/13]
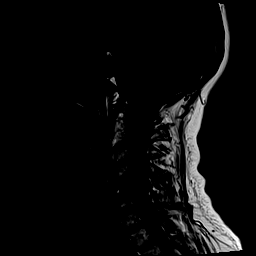
[im 9/13]
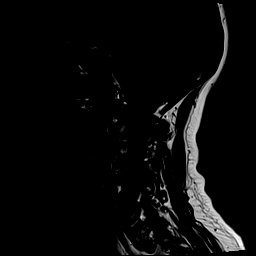
[im 11/13]
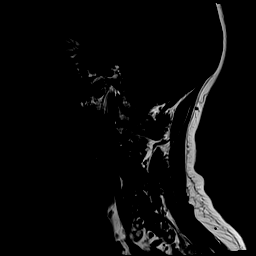
[im 13/13]
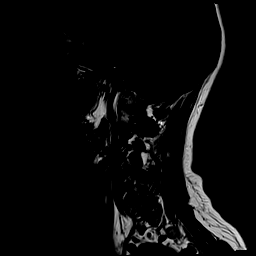

[Series 6: STIR · sagittal · 3.0mm · 0.69mm/px · 6 of 13 slices shown]
[im 1/13]
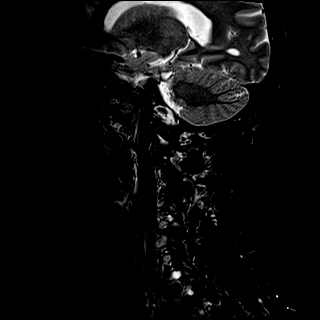
[im 3/13]
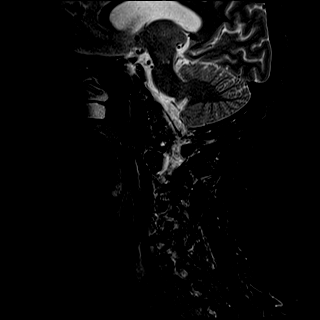
[im 5/13]
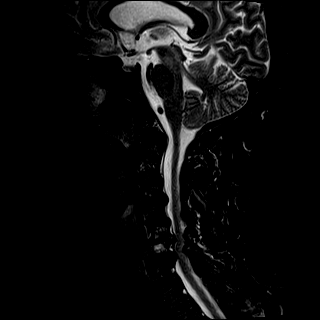
[im 8/13]
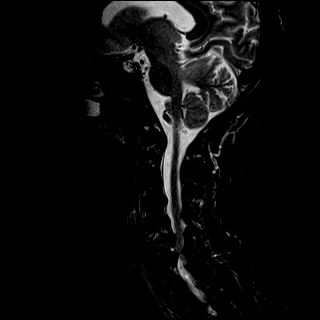
[im 10/13]
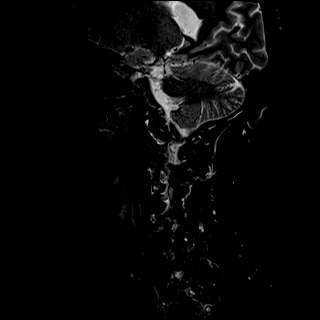
[im 13/13]
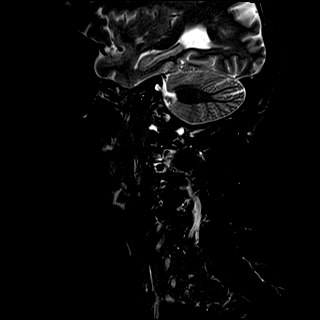

[Series 7: T2 · axial · 3.0mm · 0.62mm/px · z∈[-97,+9]mm · 13 of 29 slices shown (2 of 2)]
[im 1/29]
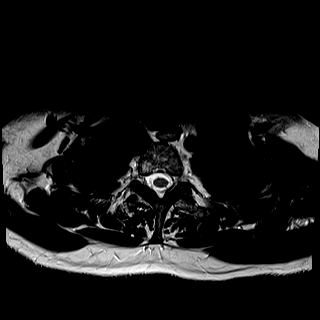
[im 3/29]
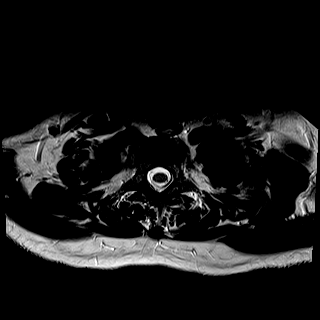
[im 5/29]
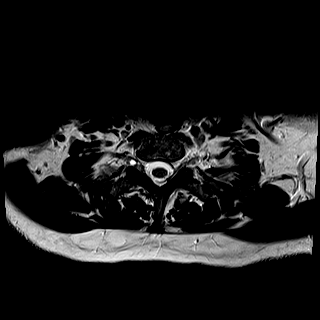
[im 7/29]
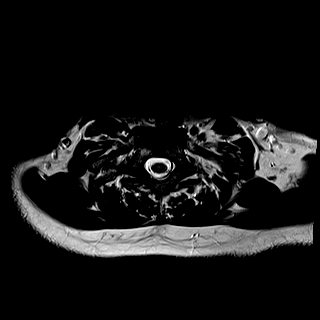
[im 9/29]
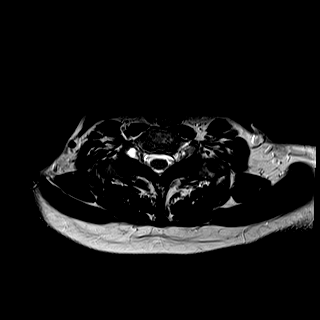
[im 11/29]
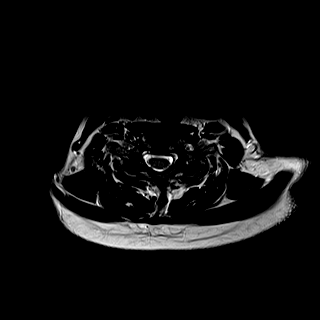
[im 13/29]
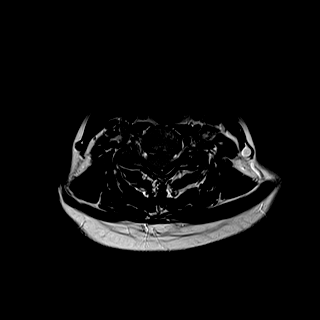
[im 16/29]
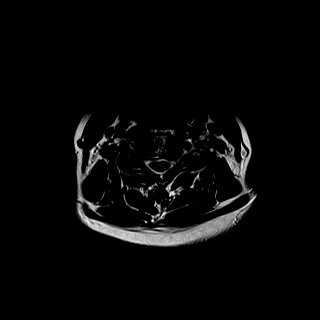
[im 18/29]
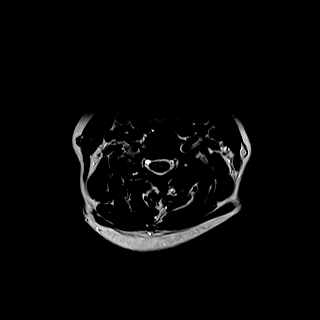
[im 20/29]
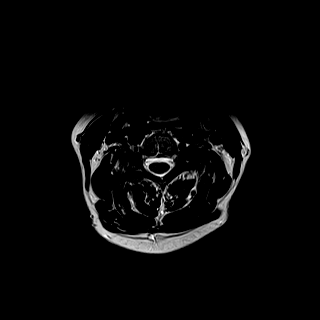
[im 22/29]
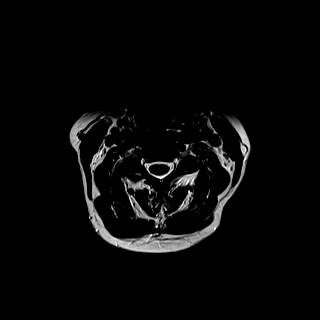
[im 24/29]
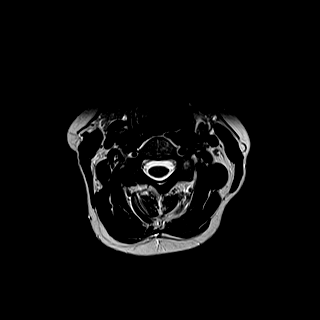
[im 29/29]
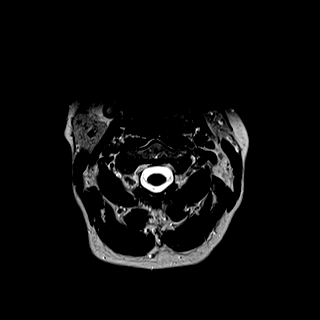

[Series 8: mpgr ax · axial · 3.0mm · 0.35mm/px · z∈[-90,+16]mm · 8 of 29 slices shown]
[im 1/29]
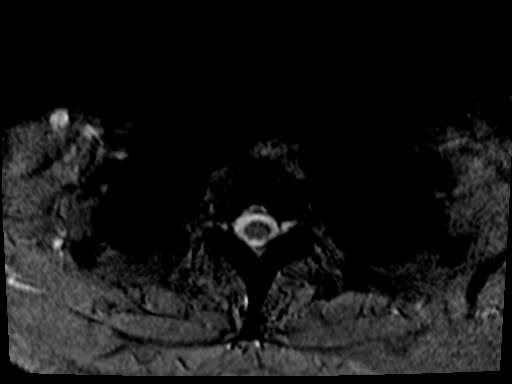
[im 5/29]
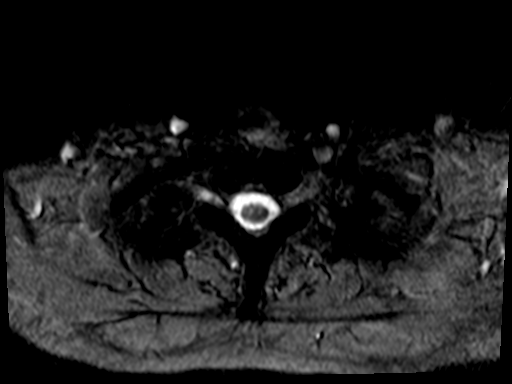
[im 9/29]
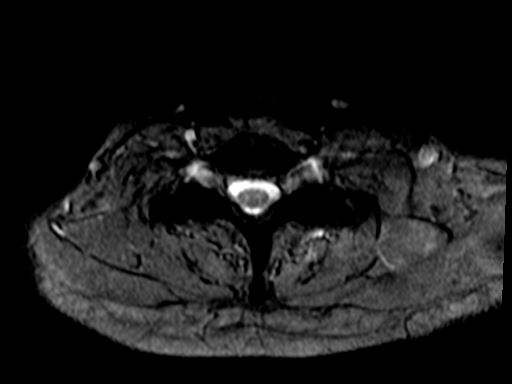
[im 13/29]
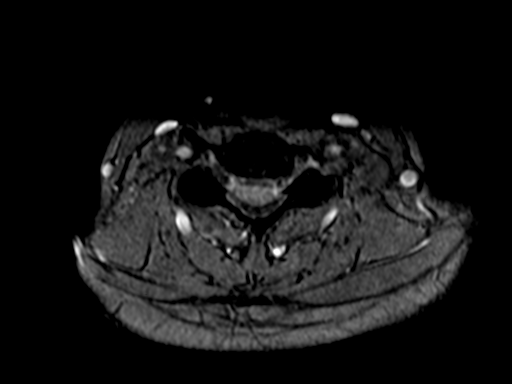
[im 16/29]
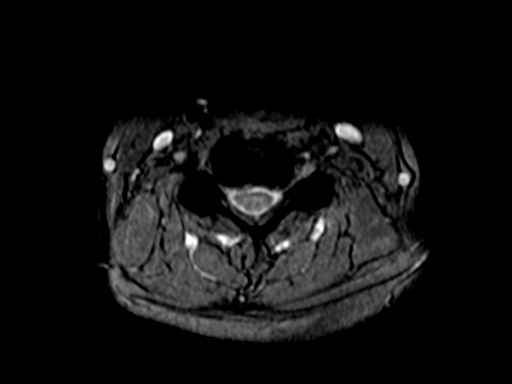
[im 20/29]
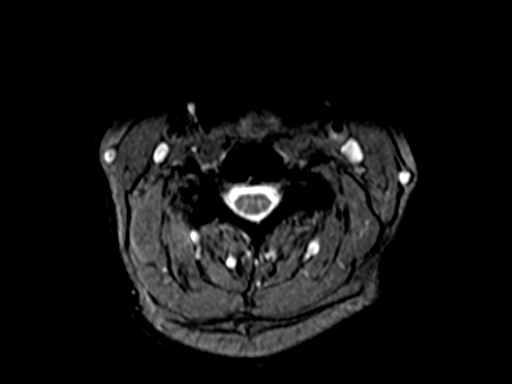
[im 24/29]
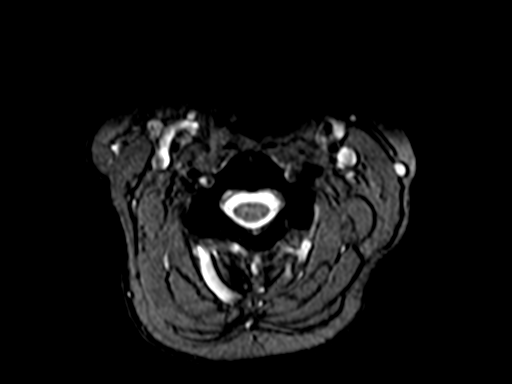
[im 29/29]
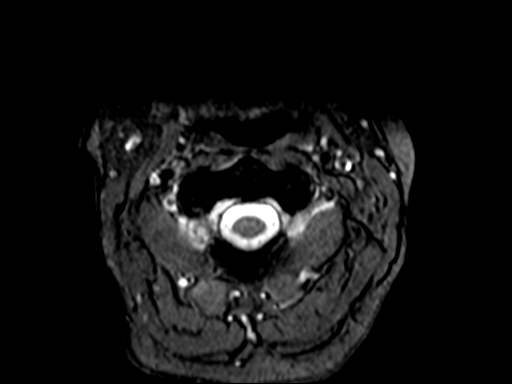

[41 of 48 positions shown; findings below may reference images not displayed]

FINDINGS: Alignment: Grade 1 anterolisthesis at C4-5. Grade 1 retrolisthesis
at C5-6

Vertebrae: No fracture, evidence of discitis, or bone lesion.

Cord: Normal signal and morphology.

Posterior Fossa, vertebral arteries, paraspinal tissues: Negative.

Disc levels:

C1-2: Unremarkable.

C2-3: Normal disc. Mild right facet hypertrophy. There is no spinal
canal stenosis. No neural foraminal stenosis.

C3-4: Small disc bulge with bilateral uncovertebral hypertrophy.
There is no spinal canal stenosis. Moderate right and mild left
neural foraminal stenosis.

C4-5: Bilateral facet hypertrophy. Small disc bulge. There is no
spinal canal stenosis. Mild bilateral neural foraminal stenosis.

C5-6: Endplate spurring. Large disc bulge. Moderate spinal canal
stenosis. Severe bilateral neural foraminal stenosis.

C6-7: Central disc extrusion with superior migration to the
infrapedicle level. Severe spinal canal stenosis. Mild right and
moderate left neural foraminal stenosis.

C7-T1: Normal disc space and facet joints. There is no spinal canal
stenosis. No neural foraminal stenosis.
IMPRESSION: 1. Large C6-7 central disc extrusion with superior migration causing
severe spinal canal stenosis.
2. Moderate C5-6 spinal canal stenosis and severe bilateral neural
foraminal stenosis.
3. Moderate right C3-4 neural foraminal stenosis.

## 2020-01-01 ENCOUNTER — Encounter: Payer: Self-pay | Admitting: Family Medicine

## 2020-01-01 ENCOUNTER — Telehealth: Payer: Self-pay | Admitting: Family Medicine

## 2020-01-01 ENCOUNTER — Encounter: Payer: Self-pay | Admitting: Physical Therapy

## 2020-01-01 DIAGNOSIS — M4802 Spinal stenosis, cervical region: Secondary | ICD-10-CM

## 2020-01-01 NOTE — Telephone Encounter (Signed)
Cervical MRI scan shows a large C6-7 disc extrusion causing severe spinal stenosis/narrowing of the spinal canal.  In addition, there is severe bilateral narrowing of the nerve openings at C5-6 due to a bulging disc and bone spurs.  I recommend surgical consultation.

## 2020-01-04 MED FILL — OXYBUTYNIN CL ER 10 MG TAB: 10 | 30 days supply | Qty: 30 | Fill #1

## 2020-01-05 MED FILL — METHYLPHENIDATE 20 MG TAB: 20 | 30 days supply | Qty: 90 | Fill #0

## 2020-01-08 DIAGNOSIS — R198 Other specified symptoms and signs involving the digestive system and abdomen: Secondary | ICD-10-CM | POA: Diagnosis not present

## 2020-01-08 DIAGNOSIS — K5901 Slow transit constipation: Secondary | ICD-10-CM | POA: Diagnosis not present

## 2020-01-12 DIAGNOSIS — R29898 Other symptoms and signs involving the musculoskeletal system: Secondary | ICD-10-CM | POA: Diagnosis not present

## 2020-01-12 DIAGNOSIS — R200 Anesthesia of skin: Secondary | ICD-10-CM | POA: Diagnosis not present

## 2020-01-12 DIAGNOSIS — M4802 Spinal stenosis, cervical region: Secondary | ICD-10-CM | POA: Diagnosis not present

## 2020-01-12 MED FILL — guanFACINE HCL 2 MG TABS: 2 | 30 days supply | Qty: 60 | Fill #0

## 2020-01-15 MED FILL — MYRBETRIQ ER 50 MG TABLET: 50 | 30 days supply | Qty: 30 | Fill #0

## 2020-01-15 MED FILL — ESTRADIOL 1 MG TABLET: 1 | 90 days supply | Qty: 90 | Fill #0

## 2020-01-19 MED FILL — buPROPion HCL ER (XL) 300 M: 300 | 30 days supply | Qty: 30 | Fill #0

## 2020-01-22 DIAGNOSIS — Z1152 Encounter for screening for COVID-19: Secondary | ICD-10-CM | POA: Diagnosis not present

## 2020-01-25 DIAGNOSIS — M50222 Other cervical disc displacement at C5-C6 level: Secondary | ICD-10-CM | POA: Diagnosis not present

## 2020-01-25 DIAGNOSIS — M50223 Other cervical disc displacement at C6-C7 level: Secondary | ICD-10-CM | POA: Diagnosis not present

## 2020-01-25 DIAGNOSIS — M50122 Cervical disc disorder at C5-C6 level with radiculopathy: Secondary | ICD-10-CM | POA: Diagnosis not present

## 2020-01-25 DIAGNOSIS — M5412 Radiculopathy, cervical region: Secondary | ICD-10-CM | POA: Diagnosis not present

## 2020-02-05 MED FILL — OXYCODONE-APAP 5-325MG: 5-325 | 10 days supply | Qty: 40 | Fill #0

## 2020-02-05 MED FILL — guanFACINE HCL 2 MG TABS: 2 | 30 days supply | Qty: 60 | Fill #1

## 2020-02-07 ENCOUNTER — Encounter: Payer: Self-pay | Admitting: Physical Therapy

## 2020-02-08 MED FILL — MYRBETRIQ ER 50 MG TABLET: 50 | 30 days supply | Qty: 30 | Fill #0

## 2020-02-09 DIAGNOSIS — M4802 Spinal stenosis, cervical region: Secondary | ICD-10-CM | POA: Diagnosis not present

## 2020-02-12 ENCOUNTER — Encounter: Payer: Self-pay | Admitting: Physical Therapy

## 2020-02-13 MED FILL — buPROPion HCL ER (XL) 300 M: 300 | 30 days supply | Qty: 30 | Fill #1

## 2020-02-16 ENCOUNTER — Other Ambulatory Visit (HOSPITAL_COMMUNITY): Payer: Self-pay | Admitting: Urology

## 2020-02-22 ENCOUNTER — Ambulatory Visit: Payer: 59 | Admitting: Physical Therapy

## 2020-02-23 DIAGNOSIS — R198 Other specified symptoms and signs involving the digestive system and abdomen: Secondary | ICD-10-CM | POA: Diagnosis not present

## 2020-02-23 DIAGNOSIS — Z86010 Personal history of colonic polyps: Secondary | ICD-10-CM | POA: Diagnosis not present

## 2020-02-23 DIAGNOSIS — K5901 Slow transit constipation: Secondary | ICD-10-CM | POA: Diagnosis not present

## 2020-02-26 ENCOUNTER — Ambulatory Visit: Payer: 59 | Admitting: Physical Therapy

## 2020-03-04 MED FILL — METHYLPHENIDATE 20 MG TAB: 20 | 30 days supply | Qty: 90 | Fill #0

## 2020-03-05 ENCOUNTER — Encounter: Payer: Self-pay | Admitting: Family Medicine

## 2020-03-06 MED FILL — guanFACINE HCL 2 MG TABS: 2 | 30 days supply | Qty: 60 | Fill #2

## 2020-03-06 NOTE — Telephone Encounter (Signed)
Schedule appointment for blood work

## 2020-03-07 MED FILL — MYRBETRIQ ER 50 MG TABLET: 50 | 30 days supply | Qty: 30 | Fill #1

## 2020-03-13 MED FILL — buPROPion HCL ER (XL) 300 M: 300 | 30 days supply | Qty: 30 | Fill #2

## 2020-03-18 MED FILL — PILOCARPINE HCL 5 MG TABLET: 5 | 90 days supply | Qty: 360 | Fill #1

## 2020-03-19 MED FILL — HYDROCHLOROTHIAZIDE 25 MG T: 25 | 90 days supply | Qty: 90 | Fill #1

## 2020-03-19 MED FILL — ESTROGEN-METHYLTESTOSTERONE: 1.25-2.5 | 90 days supply | Qty: 90 | Fill #1

## 2020-03-25 DIAGNOSIS — F902 Attention-deficit hyperactivity disorder, combined type: Secondary | ICD-10-CM | POA: Diagnosis not present

## 2020-03-25 DIAGNOSIS — F419 Anxiety disorder, unspecified: Secondary | ICD-10-CM | POA: Diagnosis not present

## 2020-03-26 ENCOUNTER — Other Ambulatory Visit: Payer: Self-pay | Admitting: Obstetrics & Gynecology

## 2020-03-26 DIAGNOSIS — Z13820 Encounter for screening for osteoporosis: Secondary | ICD-10-CM

## 2020-03-28 ENCOUNTER — Other Ambulatory Visit: Payer: 59

## 2020-03-28 DIAGNOSIS — M5412 Radiculopathy, cervical region: Secondary | ICD-10-CM | POA: Diagnosis not present

## 2020-03-28 DIAGNOSIS — R1312 Dysphagia, oropharyngeal phase: Secondary | ICD-10-CM | POA: Diagnosis not present

## 2020-03-29 ENCOUNTER — Other Ambulatory Visit (HOSPITAL_COMMUNITY): Payer: Self-pay

## 2020-03-29 DIAGNOSIS — R1310 Dysphagia, unspecified: Secondary | ICD-10-CM

## 2020-04-01 MED FILL — guanFACINE HCL 2 MG TABS: 2 | 30 days supply | Qty: 60 | Fill #0

## 2020-04-01 MED FILL — METHYLPHENIDATE HCL 20 MG T: 20 | 30 days supply | Qty: 90 | Fill #0

## 2020-04-02 MED FILL — MYRBETRIQ ER 50 MG TABLET: 50 | 30 days supply | Qty: 30 | Fill #2

## 2020-04-08 ENCOUNTER — Other Ambulatory Visit: Payer: Self-pay

## 2020-04-08 ENCOUNTER — Ambulatory Visit (HOSPITAL_COMMUNITY)
Admission: RE | Admit: 2020-04-08 | Discharge: 2020-04-08 | Disposition: A | Discharge status: Home / Self Care | Payer: 59 | Source: Ambulatory Visit | Attending: Neurological Surgery | Admitting: Neurological Surgery

## 2020-04-08 DIAGNOSIS — R1310 Dysphagia, unspecified: Secondary | ICD-10-CM | POA: Insufficient documentation

## 2020-04-08 DIAGNOSIS — R1312 Dysphagia, oropharyngeal phase: Secondary | ICD-10-CM | POA: Insufficient documentation

## 2020-04-08 MED FILL — ESTRADIOL 1 MG TABS: 1 | 90 days supply | Qty: 90 | Fill #1

## 2020-04-08 NOTE — Progress Notes (Signed)
Modified Barium Swallow Progress Note  Patient Details  Name: Dorothy Wood MRN: [id number] Date of Birth: 12-29-1960  Today's Date: 04/08/2020  Modified Barium Swallow completed.  Full report located under Chart Review in the Imaging Section.  Brief recommendations include the following:  Clinical Impression  Pt's oral and pharyngeal swallow abillity was within normal limits. The tissue below the esophageal sphincter appeared somewhat edematous. The pill lodged in her valleculae until spontaneous second swallow 3-5 second delay which she had difficulty with at home on one ocassion. Timing, coordination, and laryngeal closure effective to prevent any penetration or aspiration during this assessment and risk is low. MBS does not diagnose below the UES however esohageal scan did not reveal findings to note. Educated pt to use caution with meat and bread and ensure meats are moist and consume pills 1-2 at a time. If globus sensation, swallow additional time or use thin or puree to transit. Recommend continue regular, thin, straws.        Swallow Evaluation Recommendations       SLP Diet Recommendations: Regular solids;Thin liquid   Liquid Administration via: Cup;Straw   Medication Administration: Whole meds with liquid   Supervision: Patient able to self feed   Compensations: Multiple dry swallows after each bite/sip;Other (Comment) (additional sips/bites after pills if needed)   Postural Changes: Seated upright at 90 degrees   Oral Care Recommendations: Oral care BID        Houston Siren 04/08/2020,2:17 PM  Or[employer] [place] Risk analyst [phone] Office [phone]

## 2020-04-11 MED FILL — MYRBETRIQ ER 50 MG TABLET: 50 | 30 days supply | Qty: 30 | Fill #2

## 2020-04-12 MED FILL — buPROPion HCL ER (XL) 300 M: 300 | 30 days supply | Qty: 30 | Fill #0

## 2020-04-15 DIAGNOSIS — F902 Attention-deficit hyperactivity disorder, combined type: Secondary | ICD-10-CM | POA: Diagnosis not present

## 2020-04-15 DIAGNOSIS — F419 Anxiety disorder, unspecified: Secondary | ICD-10-CM | POA: Diagnosis not present

## 2020-04-19 ENCOUNTER — Encounter: Payer: Self-pay | Admitting: Family Medicine

## 2020-04-19 MED FILL — LORazepam 1 MG TABS: 1 | 15 days supply | Qty: 15 | Fill #0

## 2020-04-22 DIAGNOSIS — H524 Presbyopia: Secondary | ICD-10-CM | POA: Diagnosis not present

## 2020-04-25 MED FILL — guanFACINE HCL 2 MG TABS: 2 | 30 days supply | Qty: 60 | Fill #1

## 2020-04-26 DIAGNOSIS — L603 Nail dystrophy: Secondary | ICD-10-CM | POA: Diagnosis not present

## 2020-04-29 ENCOUNTER — Other Ambulatory Visit: Payer: Self-pay

## 2020-04-29 ENCOUNTER — Emergency Department (HOSPITAL_COMMUNITY): Payer: 59

## 2020-04-29 ENCOUNTER — Observation Stay (HOSPITAL_COMMUNITY)
Admission: EM | Admit: 2020-04-29 | Discharge: 2020-04-30 | Disposition: A | Discharge status: Home / Self Care | Payer: 59 | Attending: Internal Medicine | Admitting: Internal Medicine

## 2020-04-29 ENCOUNTER — Observation Stay (HOSPITAL_COMMUNITY): Payer: 59

## 2020-04-29 DIAGNOSIS — I10 Essential (primary) hypertension: Secondary | ICD-10-CM | POA: Diagnosis present

## 2020-04-29 DIAGNOSIS — Z20822 Contact with and (suspected) exposure to covid-19: Secondary | ICD-10-CM | POA: Diagnosis not present

## 2020-04-29 DIAGNOSIS — Z8543 Personal history of malignant neoplasm of ovary: Secondary | ICD-10-CM | POA: Diagnosis not present

## 2020-04-29 DIAGNOSIS — Z79899 Other long term (current) drug therapy: Secondary | ICD-10-CM | POA: Insufficient documentation

## 2020-04-29 DIAGNOSIS — F909 Attention-deficit hyperactivity disorder, unspecified type: Secondary | ICD-10-CM | POA: Insufficient documentation

## 2020-04-29 DIAGNOSIS — R200 Anesthesia of skin: Secondary | ICD-10-CM

## 2020-04-29 DIAGNOSIS — M3500 Sicca syndrome, unspecified: Secondary | ICD-10-CM | POA: Diagnosis present

## 2020-04-29 DIAGNOSIS — F988 Other specified behavioral and emotional disorders with onset usually occurring in childhood and adolescence: Secondary | ICD-10-CM | POA: Diagnosis present

## 2020-04-29 DIAGNOSIS — E039 Hypothyroidism, unspecified: Secondary | ICD-10-CM | POA: Diagnosis present

## 2020-04-29 DIAGNOSIS — G459 Transient cerebral ischemic attack, unspecified: Secondary | ICD-10-CM | POA: Diagnosis not present

## 2020-04-29 DIAGNOSIS — R531 Weakness: Secondary | ICD-10-CM | POA: Diagnosis not present

## 2020-04-29 DIAGNOSIS — R29818 Other symptoms and signs involving the nervous system: Secondary | ICD-10-CM | POA: Diagnosis not present

## 2020-04-29 DIAGNOSIS — I6622 Occlusion and stenosis of left posterior cerebral artery: Secondary | ICD-10-CM | POA: Diagnosis not present

## 2020-04-29 LAB — CBC
HCT: 41.1 % (ref 36.0–46.0)
HCT: 37.6 % (ref 36.0–46.0)
Hemoglobin: 13.7 g/dL (ref 12.0–15.0)
Hemoglobin: 12.5 g/dL (ref 12.0–15.0)
MCH: 30.3 pg (ref 26.0–34.0)
MCH: 30 pg (ref 26.0–34.0)
MCHC: 33.3 g/dL (ref 30.0–36.0)
MCHC: 33.2 g/dL (ref 30.0–36.0)
MCV: 90.9 fL (ref 80.0–100.0)
MCV: 90.2 fL (ref 80.0–100.0)
Platelets: 291 10*3/uL (ref 150–400)
Platelets: 239 10*3/uL (ref 150–400)
RBC: 4.52 MIL/uL (ref 3.87–5.11)
RBC: 4.17 MIL/uL (ref 3.87–5.11)
RDW: 12 % (ref 11.5–15.5)
RDW: 12 % (ref 11.5–15.5)
WBC: 8.1 10*3/uL (ref 4.0–10.5)
WBC: 7.6 10*3/uL (ref 4.0–10.5)
nRBC: 0 % (ref 0.0–0.2)
nRBC: 0 % (ref 0.0–0.2)

## 2020-04-29 LAB — I-STAT CHEM 8, ED
BUN: 21 mg/dL — ABNORMAL HIGH (ref 6–20)
Calcium, Ion: 1.19 mmol/L (ref 1.15–1.40)
Chloride: 99 mmol/L (ref 98–111)
Creatinine, Ser: 0.9 mg/dL (ref 0.44–1.00)
Glucose, Bld: 98 mg/dL (ref 70–99)
HCT: 41 % (ref 36.0–46.0)
Hemoglobin: 13.9 g/dL (ref 12.0–15.0)
Potassium: 3.7 mmol/L (ref 3.5–5.1)
Sodium: 139 mmol/L (ref 135–145)
TCO2: 25 mmol/L (ref 22–32)

## 2020-04-29 LAB — URINALYSIS, ROUTINE W REFLEX MICROSCOPIC
Bilirubin Urine: NEGATIVE
Glucose, UA: NEGATIVE mg/dL
Ketones, ur: 5 mg/dL — AB
Nitrite: NEGATIVE
Protein, ur: NEGATIVE mg/dL
Specific Gravity, Urine: 1.044 — ABNORMAL HIGH (ref 1.005–1.030)
pH: 6 (ref 5.0–8.0)

## 2020-04-29 LAB — DIFFERENTIAL
Abs Immature Granulocytes: 0.03 10*3/uL (ref 0.00–0.07)
Basophils Absolute: 0 10*3/uL (ref 0.0–0.1)
Basophils Relative: 1 %
Eosinophils Absolute: 0.1 10*3/uL (ref 0.0–0.5)
Eosinophils Relative: 1 %
Immature Granulocytes: 0 %
Lymphocytes Relative: 31 %
Lymphs Abs: 2.5 10*3/uL (ref 0.7–4.0)
Monocytes Absolute: 0.8 10*3/uL (ref 0.1–1.0)
Monocytes Relative: 10 %
Neutro Abs: 4.7 10*3/uL (ref 1.7–7.7)
Neutrophils Relative %: 57 %

## 2020-04-29 LAB — PROTIME-INR
INR: 1 (ref 0.8–1.2)
Prothrombin Time: 12.4 seconds (ref 11.4–15.2)

## 2020-04-29 LAB — RAPID URINE DRUG SCREEN, HOSP PERFORMED
Amphetamines: NOT DETECTED
Barbiturates: NOT DETECTED
Benzodiazepines: POSITIVE — AB
Cocaine: NOT DETECTED
Opiates: NOT DETECTED
Tetrahydrocannabinol: NOT DETECTED

## 2020-04-29 LAB — COMPREHENSIVE METABOLIC PANEL
ALT: 29 U/L (ref 0–44)
AST: 30 U/L (ref 15–41)
Albumin: 4 g/dL (ref 3.5–5.0)
Alkaline Phosphatase: 61 U/L (ref 38–126)
Anion gap: 12 (ref 5–15)
BUN: 18 mg/dL (ref 6–20)
CO2: 26 mmol/L (ref 22–32)
Calcium: 9.7 mg/dL (ref 8.9–10.3)
Chloride: 101 mmol/L (ref 98–111)
Creatinine, Ser: 1 mg/dL (ref 0.44–1.00)
GFR calc Af Amer: >60 mL/min (ref >60)
GFR calc non Af Amer: >60 mL/min (ref >60)
Glucose, Bld: 102 mg/dL — ABNORMAL HIGH (ref 70–99)
Potassium: 3.9 mmol/L (ref 3.5–5.1)
Sodium: 139 mmol/L (ref 135–145)
Total Bilirubin: 0.7 mg/dL (ref 0.3–1.2)
Total Protein: 6.9 g/dL (ref 6.5–8.1)

## 2020-04-29 LAB — I-STAT BETA HCG BLOOD, ED (MC, WL, AP ONLY): I-stat hCG, quantitative: <5 m[IU]/mL (ref <5)

## 2020-04-29 LAB — APTT: aPTT: 30 seconds (ref 24–36)

## 2020-04-29 LAB — CBG MONITORING, ED: Glucose-Capillary: 104 mg/dL — ABNORMAL HIGH (ref 70–99)

## 2020-04-29 LAB — SARS CORONAVIRUS 2 BY RT PCR (HOSPITAL ORDER, PERFORMED IN ~~LOC~~ HOSPITAL LAB): SARS Coronavirus 2: NEGATIVE

## 2020-04-29 LAB — ETHANOL: Alcohol, Ethyl (B): <10 mg/dL (ref <10)

## 2020-04-29 LAB — CREATININE, SERUM
Creatinine, Ser: 0.87 mg/dL (ref 0.44–1.00)
GFR calc Af Amer: >60 mL/min (ref >60)
GFR calc non Af Amer: >60 mL/min (ref >60)

## 2020-04-29 IMAGING — CT CT HEAD CODE STROKE
4 series · 16 of 47 positions shown, 18 images · non-contrast
Comparison: None.
COMPARISON: None.

Addendum:
CLINICAL DATA: Code stroke.  Right-sided weakness and numbness

EXAM:
CT HEAD WITHOUT CONTRAST
TECHNIQUE: Contiguous axial images were obtained from the base of the skull
through the vertex without intravenous contrast.

[Series 3: head wo · axial · 0.43mm/px · z∈[+380,+500]mm · 7 of 32 slices shown, 9 images]
[im 4/32  brain]
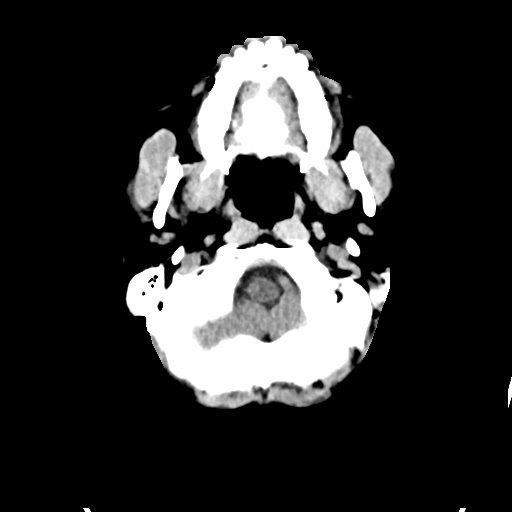
[im 4/32  bone]
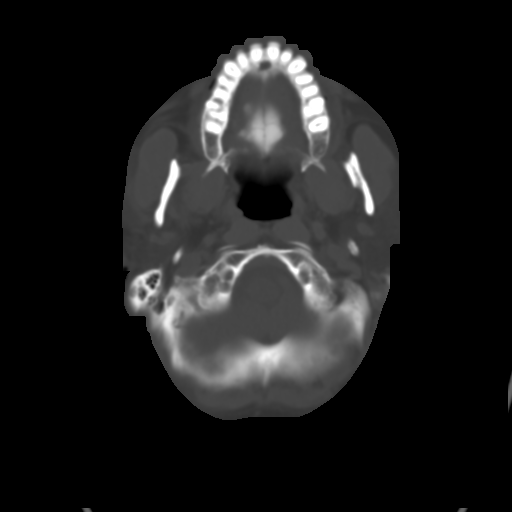
[im 8/32  brain]
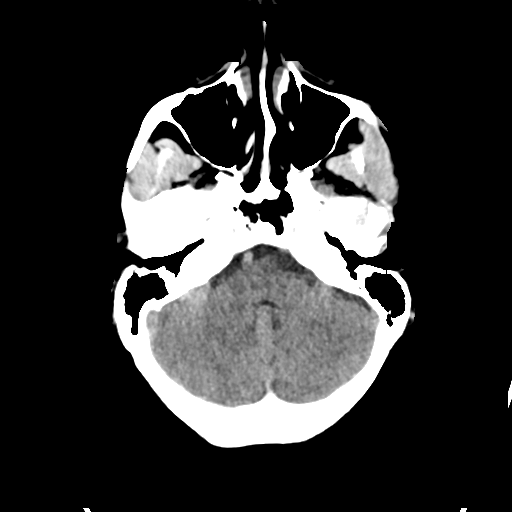
[im 12/32  brain]
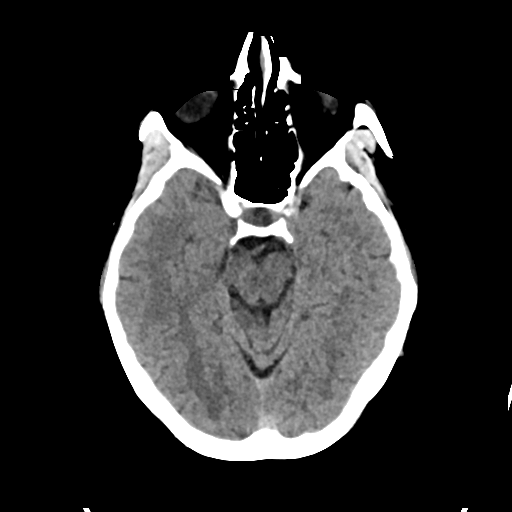
[im 16/32  brain]
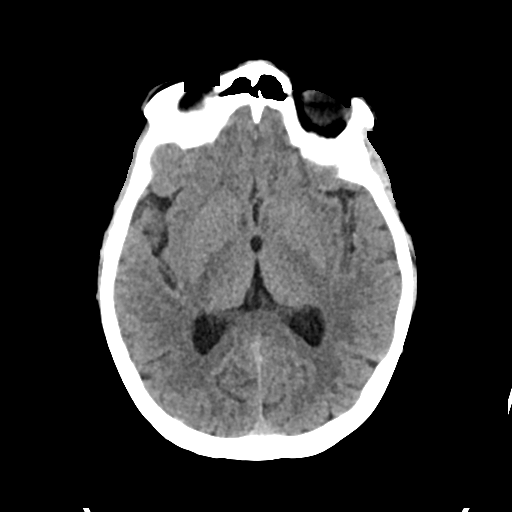
[im 20/32  brain]
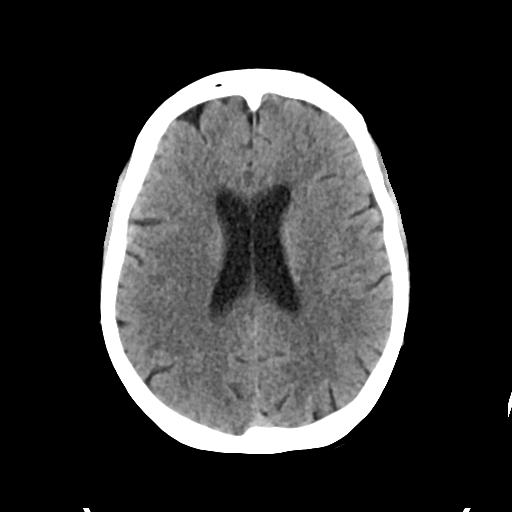
[im 20/32  bone]
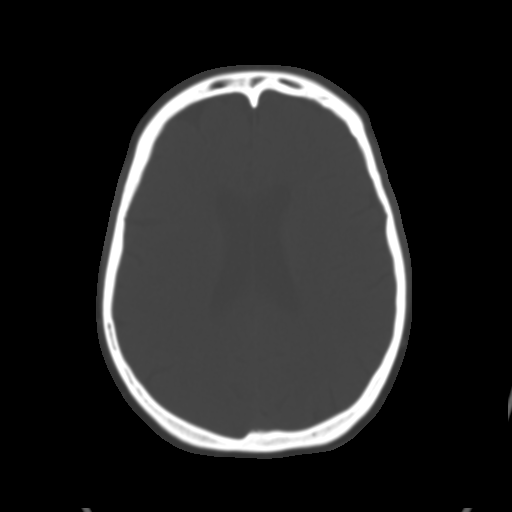
[im 24/32  brain]
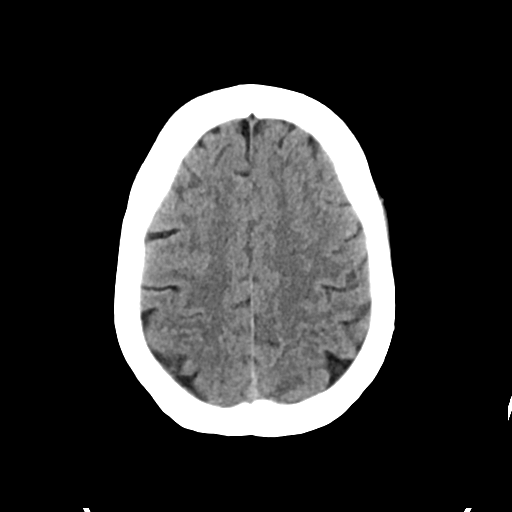
[im 28/32  brain]
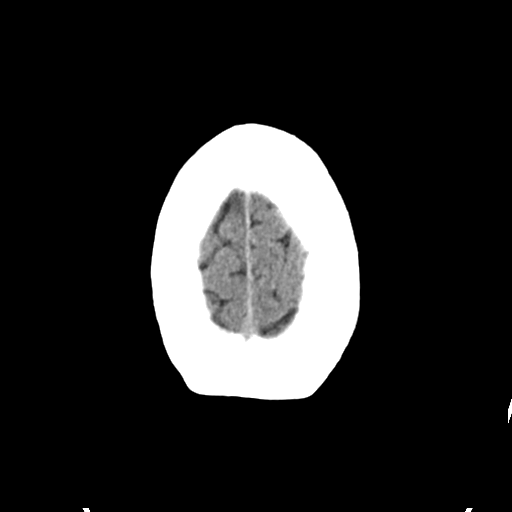

[Series 4: head bone · axial · 0.43mm/px · z∈[+378,+410]mm · 3 of 80 slices shown]
[im 8/80  bone]
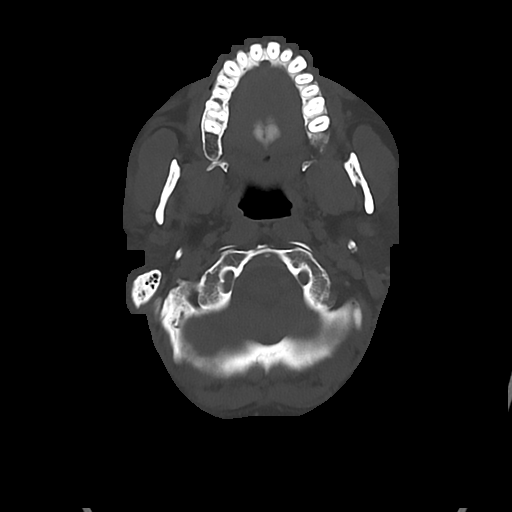
[im 16/80  bone]
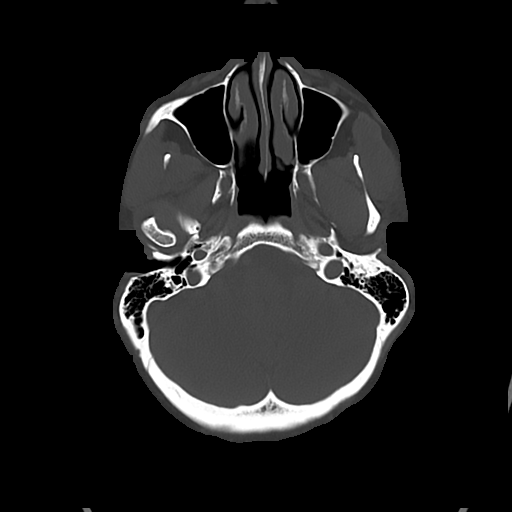
[im 24/80  bone]
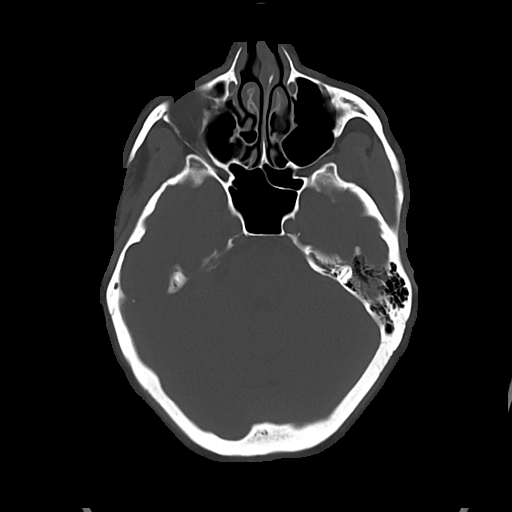

[Series 5: cor soft · coronal · 0.32mm/px · 3 of 66 slices shown]
[im 22/66  brain]
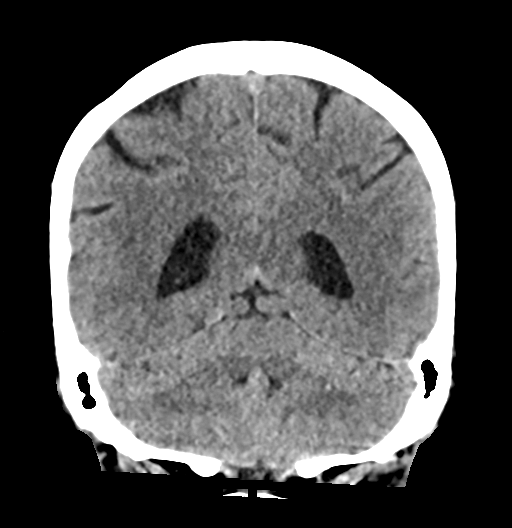
[im 29/66  brain]
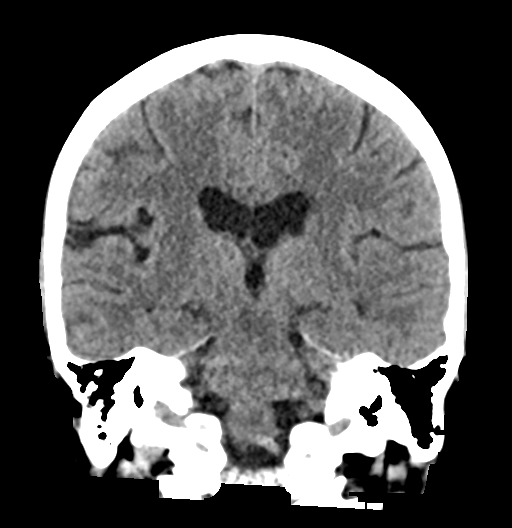
[im 37/66  brain]
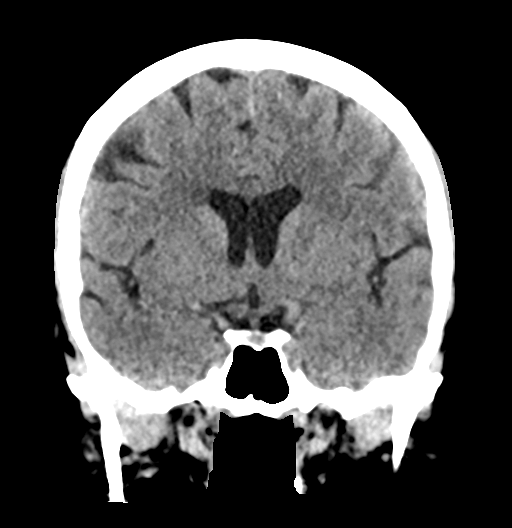

[Series 6: sag soft · sagittal · 0.33mm/px · 3 of 55 slices shown]
[im 19/55  brain]
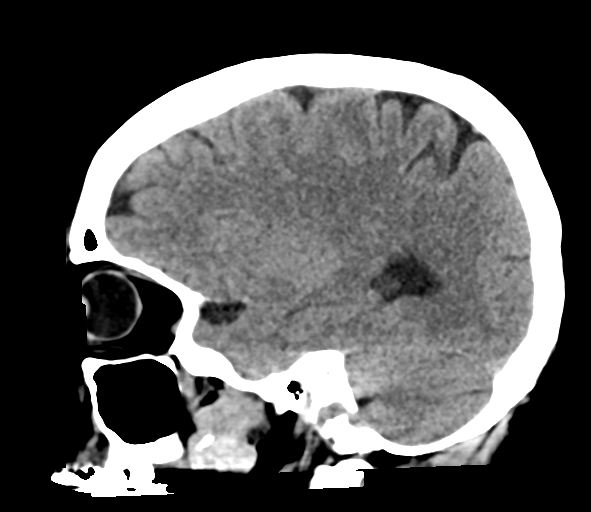
[im 28/55  brain]
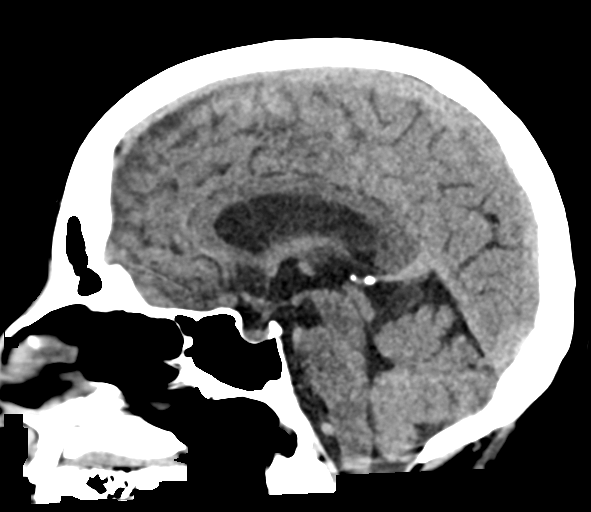
[im 37/55  brain]
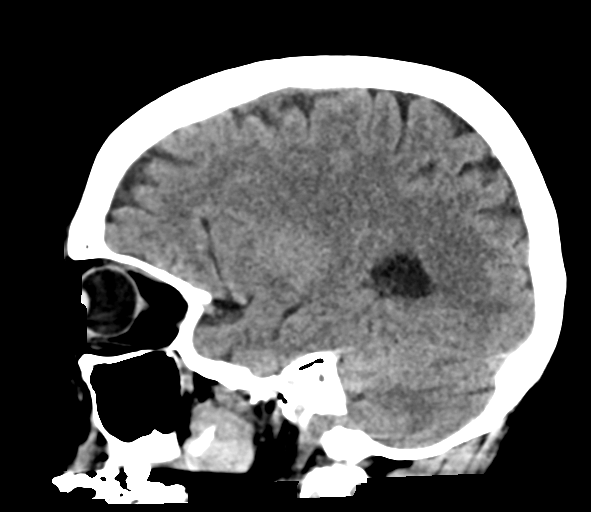

[16 of 47 positions shown; findings below may reference images not displayed]

FINDINGS: Brain: There is no mass, hemorrhage or extra-axial collection. The
size and configuration of the ventricles and extra-axial CSF spaces
are normal. The brain parenchyma is normal, without evidence of
acute or chronic infarction.

Vascular: No abnormal hyperdensity of the major intracranial
arteries or dural venous sinuses. No intracranial atherosclerosis.

Skull: The visualized skull base, calvarium and extracranial soft
tissues are normal.

Sinuses/Orbits: No fluid levels or advanced mucosal thickening of
the visualized paranasal sinuses. No mastoid or middle ear effusion.
The orbits are normal.

ASPECTS (Alberta Stroke Program Early CT Score)

- Ganglionic level infarction (caudate, lentiform nuclei, internal
capsule, insula, M1-M3 cortex): 7

- Supraganglionic infarction (M4-M6 cortex): 3

Total score (0-10 with 10 being normal): 10
IMPRESSION: 1. Normal head CT.
2. ASPECTS is 10.

Dr. SAMSONAITE paged at [DATE].

ADDENDUM:
These results were called by telephone at the time of interpretation
on [DATE] at [DATE] to provider Dr. SAMSONAITE , who verbally
acknowledged these results.

*** End of Addendum ***
FINDINGS: Brain: There is no mass, hemorrhage or extra-axial collection. The
size and configuration of the ventricles and extra-axial CSF spaces
are normal. The brain parenchyma is normal, without evidence of
acute or chronic infarction.

Vascular: No abnormal hyperdensity of the major intracranial
arteries or dural venous sinuses. No intracranial atherosclerosis.

Skull: The visualized skull base, calvarium and extracranial soft
tissues are normal.

Sinuses/Orbits: No fluid levels or advanced mucosal thickening of
the visualized paranasal sinuses. No mastoid or middle ear effusion.
The orbits are normal.

ASPECTS (Alberta Stroke Program Early CT Score)

- Ganglionic level infarction (caudate, lentiform nuclei, internal
capsule, insula, M1-M3 cortex): 7

- Supraganglionic infarction (M4-M6 cortex): 3

Total score (0-10 with 10 being normal): 10
IMPRESSION: 1. Normal head CT.
2. ASPECTS is 10.

Dr. SAMSONAITE paged at [DATE].

## 2020-04-29 IMAGING — MR MR HEAD W/O CM
10 of 11 series · 43 of 48 positions shown · non-contrast
Comparison: None.

CLINICAL DATA: Right-sided weakness and numbness.

EXAM:
MRI HEAD WITHOUT CONTRAST
MRA HEAD WITHOUT CONTRAST
TECHNIQUE: Multiplanar, multiecho pulse sequences of the brain and surrounding
structures were obtained without intravenous contrast. Angiographic
images of the head were obtained using MRA technique without
contrast.

[Series 5: DWI · axial · 3.0mm · 0.88mm/px · z∈[-71,+75]mm · 10 of 100 slices shown (1 of 4)]
[im 1/100]
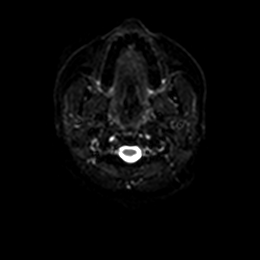
[im 12/100]
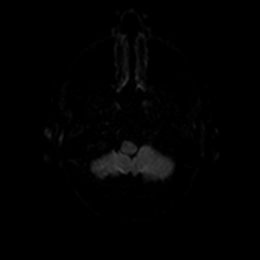
[im 23/100]
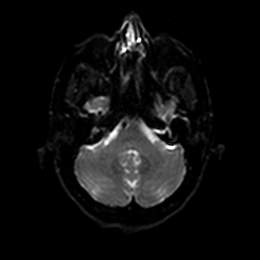
[im 34/100]
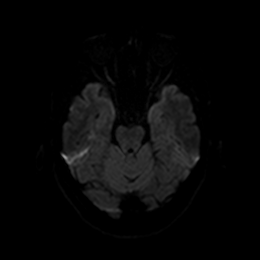
[im 45/100]
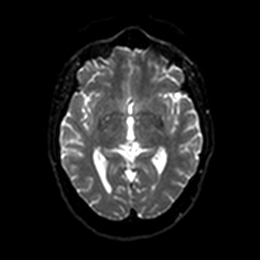
[im 56/100]
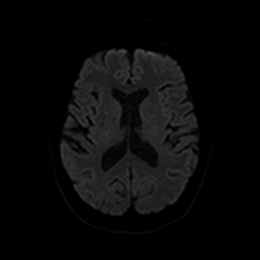
[im 67/100]
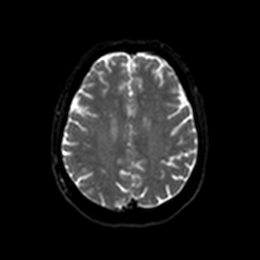
[im 78/100]
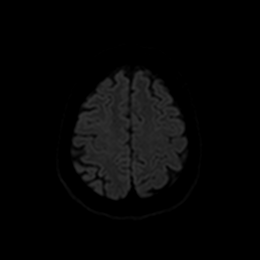
[im 89/100]
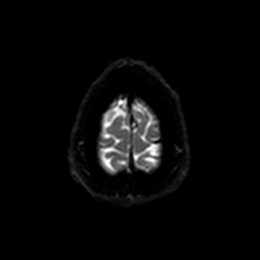
[im 100/100]
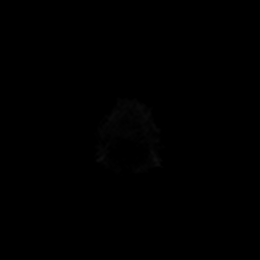

[Series 6: DWI · axial · 3.0mm · 0.88mm/px · z∈[-71,+75]mm · 5 of 50 slices shown (2 of 4)]
[im 1/50]
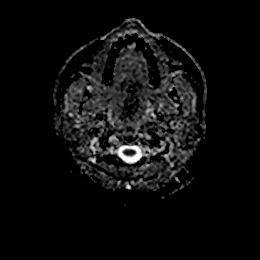
[im 13/50]
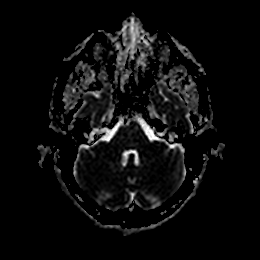
[im 25/50]
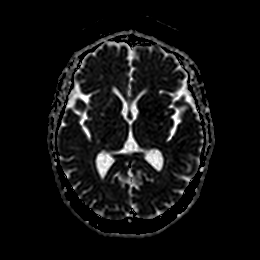
[im 37/50]
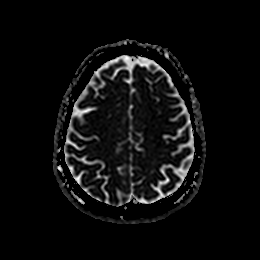
[im 50/50]
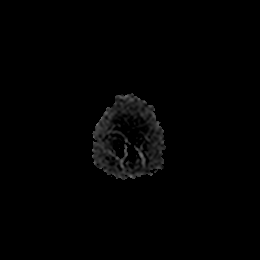

[Series 7: DWI · coronal · 4.0mm · 0.88mm/px · 6 of 64 slices shown (3 of 4)]
[im 1/64]
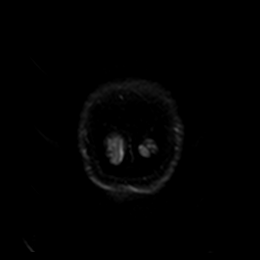
[im 13/64]
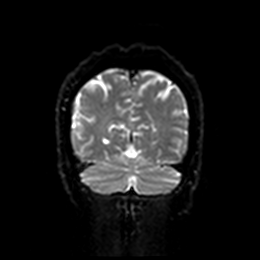
[im 26/64]
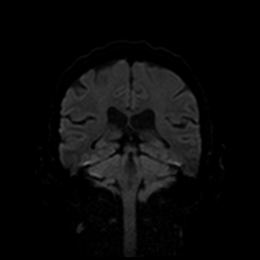
[im 38/64]
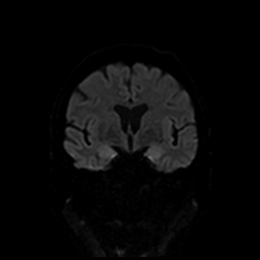
[im 51/64]
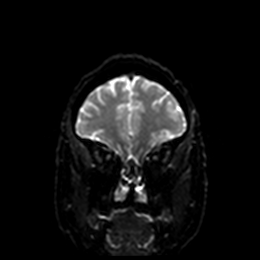
[im 64/64]
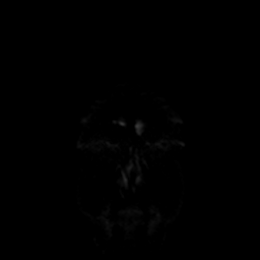

[Series 8: DWI · coronal · 4.0mm · 0.88mm/px · 3 of 32 slices shown (4 of 4)]
[im 1/32]
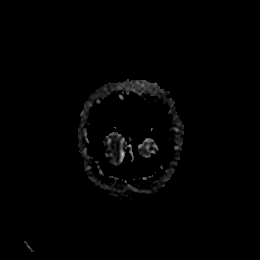
[im 16/32]
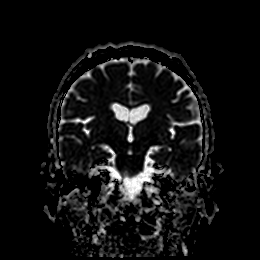
[im 32/32]
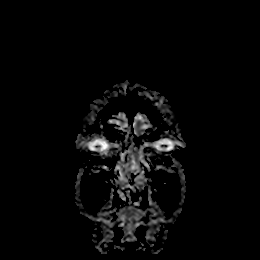

[Series 13: T1 · sagittal · 5.0mm · 0.75mm/px · 2 of 23 slices shown]
[im 1/23]
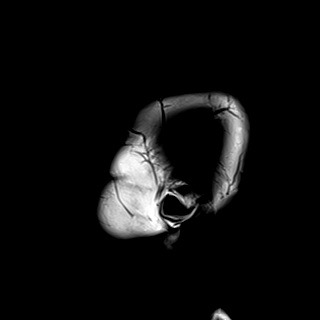
[im 23/23]
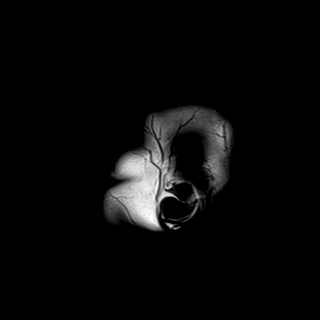

[Series 14: T2 · axial · 5.0mm · 0.72mm/px · z∈[-69,+74]mm · 2 of 25 slices shown (1 of 2)]
[im 1/25]
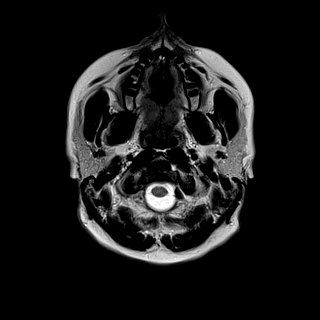
[im 25/25]
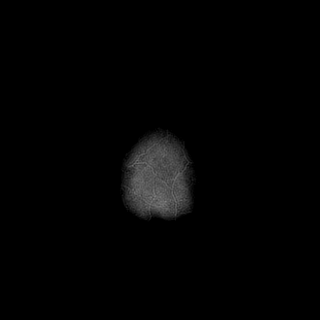

[Series 15: FLAIR · axial · 5.0mm · 0.45mm/px · z∈[-68,+75]mm · 2 of 25 slices shown]
[im 1/25]
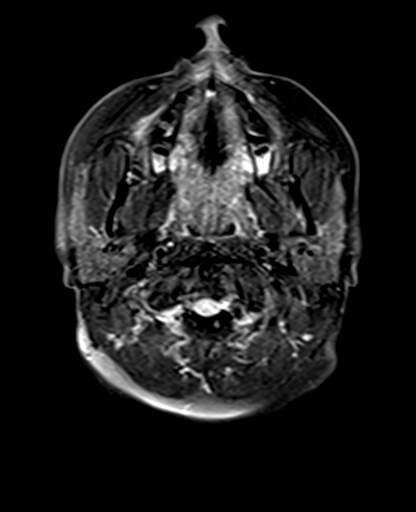
[im 25/25]
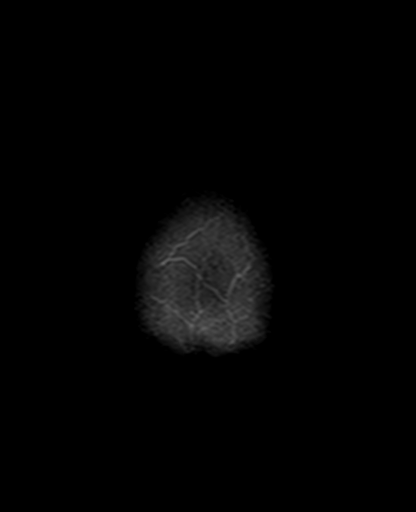

[Series 17: pha_images · axial · 3.0mm · 0.90mm/px · z∈[-84,+92]mm · 5 of 60 slices shown]
[im 1/60]
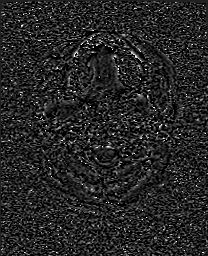
[im 15/60]
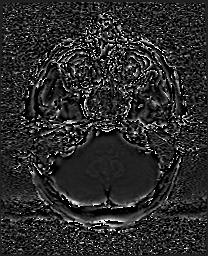
[im 30/60]
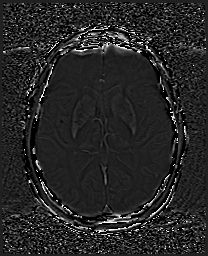
[im 45/60]
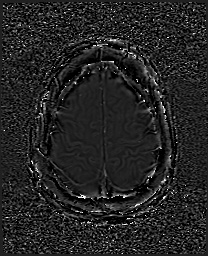
[im 60/60]
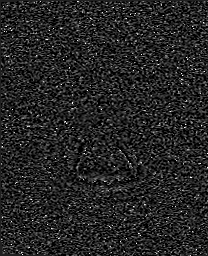

[Series 18: swi_images · axial · 3.0mm · 0.90mm/px · z∈[-84,+92]mm · 5 of 60 slices shown]
[im 1/60]
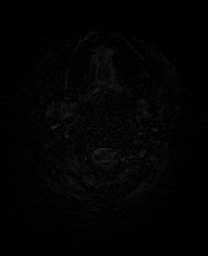
[im 15/60]
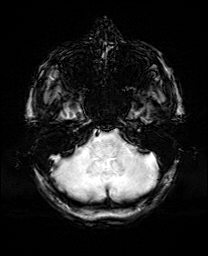
[im 30/60]
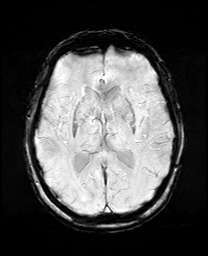
[im 45/60]
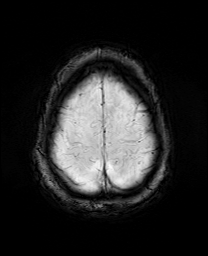
[im 60/60]
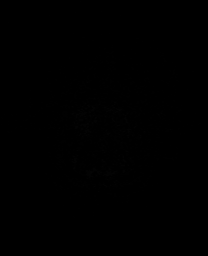

[Series 21: T2 · coronal · 5.0mm · 0.34mm/px · 3 of 29 slices shown (2 of 2)]
[im 1/29]
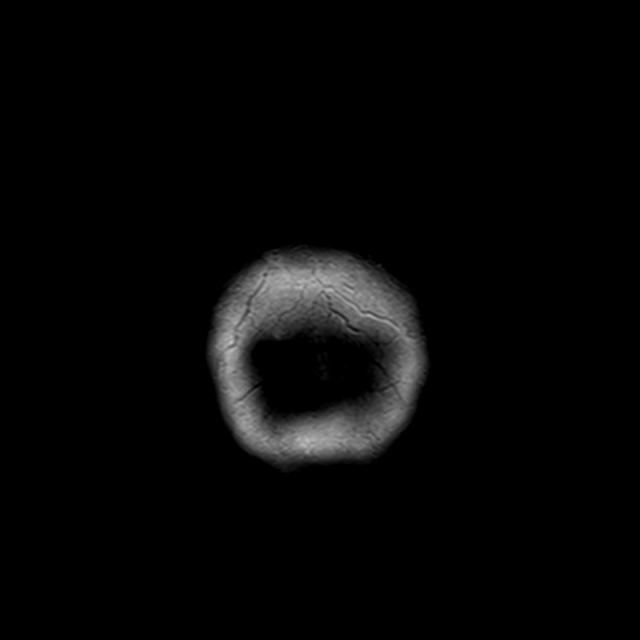
[im 15/29]
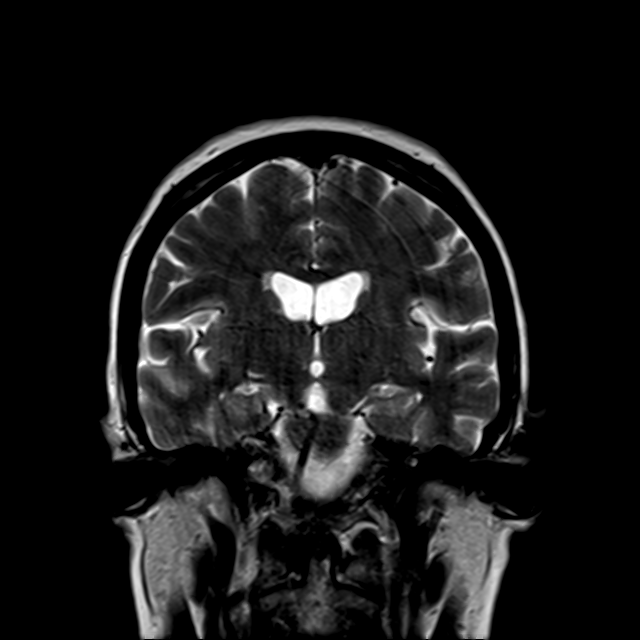
[im 29/29]
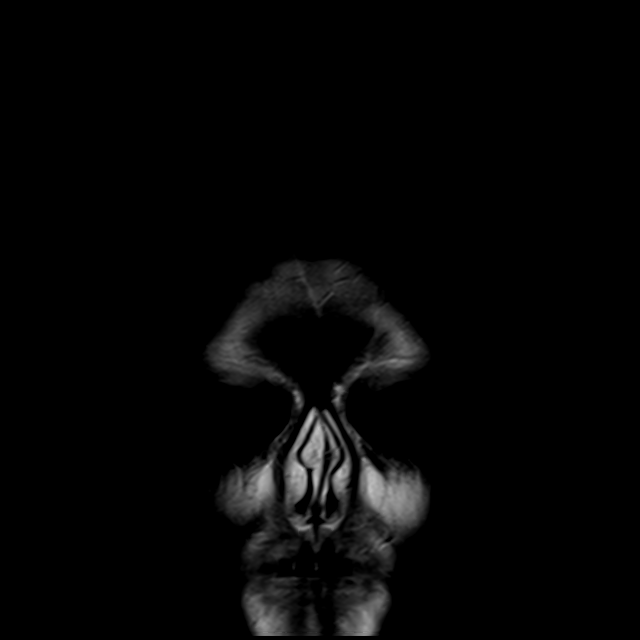

[43 of 48 positions shown; findings below may reference images not displayed]

FINDINGS: MRI HEAD FINDINGS

BRAIN: No acute infarct, acute hemorrhage or extra-axial collection.
Normal white matter signal. Normal volume of CSF spaces. No chronic
microhemorrhage. Normal midline structures.

VASCULAR: Major flow voids are preserved.

SKULL AND UPPER CERVICAL SPINE: Normal calvarium and skull base.
Visualized upper cervical spine and soft tissues are normal.

SINUSES/ORBITS: No paranasal sinus fluid levels or advanced mucosal
thickening. No mastoid or middle ear effusion. Normal orbits.

MRA HEAD FINDINGS

POSTERIOR CIRCULATION:

--Vertebral arteries: Normal V4 segments.

--Inferior cerebellar arteries: Normal.

--Basilar artery: Normal.

--Superior cerebellar arteries: Normal.

--Posterior cerebral arteries: Normal.

ANTERIOR CIRCULATION:

--Intracranial internal carotid arteries: Normal.

--Anterior cerebral arteries (ACA): Normal. Both A1 segments are
present. Patent anterior communicating artery (a-comm).

--Middle cerebral arteries (MCA): Normal.
IMPRESSION: Normal MRI/MRA of the brain.

## 2020-04-29 IMAGING — MR MR MRA HEAD W/O CM
10 of 12 series · 32 of 48 positions shown · non-contrast
Comparison: None.

CLINICAL DATA: Right-sided weakness and numbness.

EXAM:
MRI HEAD WITHOUT CONTRAST
MRA HEAD WITHOUT CONTRAST
TECHNIQUE: Multiplanar, multiecho pulse sequences of the brain and surrounding
structures were obtained without intravenous contrast. Angiographic
images of the head were obtained using MRA technique without
contrast.

[Series 5: DWI · axial · 3.0mm · 0.88mm/px · z∈[-71,+75]mm · 7 of 100 slices shown (1 of 4)]
[im 1/100]
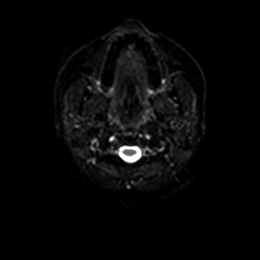
[im 17/100]
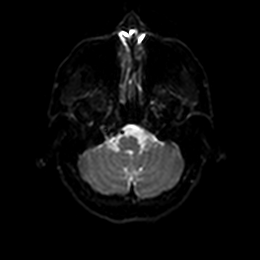
[im 34/100]
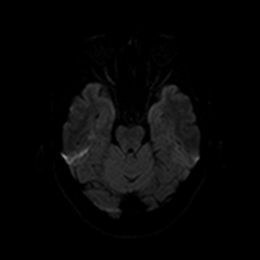
[im 50/100]
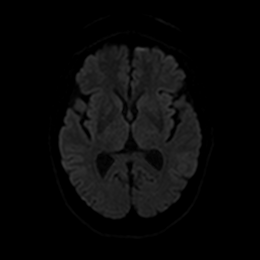
[im 67/100]
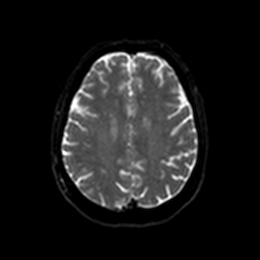
[im 83/100]
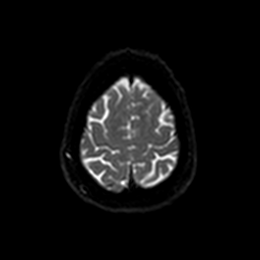
[im 100/100]
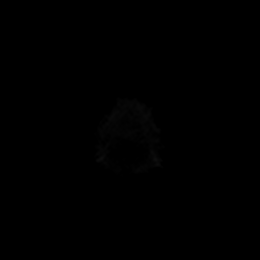

[Series 6: DWI · axial · 3.0mm · 0.88mm/px · z∈[-71,+75]mm · 3 of 50 slices shown (2 of 4)]
[im 1/50]
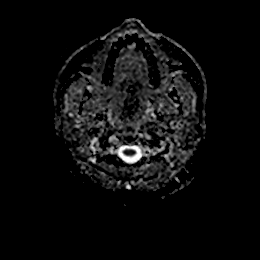
[im 25/50]
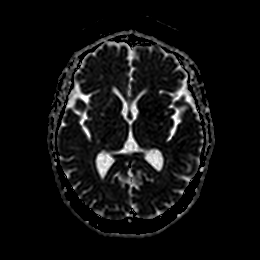
[im 50/50]
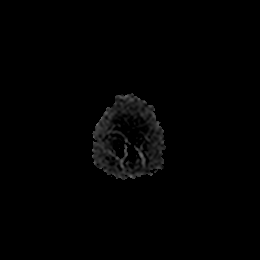

[Series 7: DWI · coronal · 4.0mm · 0.88mm/px · 4 of 64 slices shown (3 of 4)]
[im 1/64]
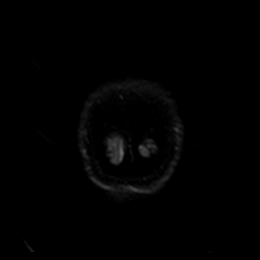
[im 22/64]
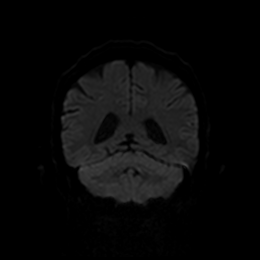
[im 43/64]
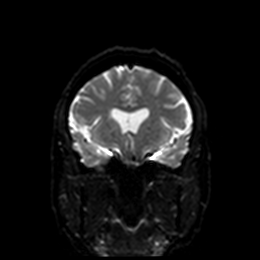
[im 64/64]
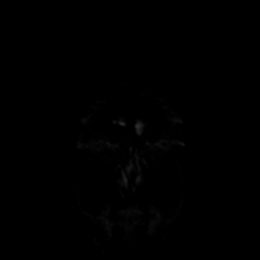

[Series 8: DWI · coronal · 4.0mm · 0.88mm/px · 2 of 32 slices shown (4 of 4)]
[im 1/32]
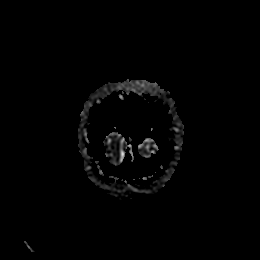
[im 32/32]
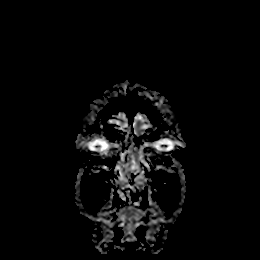

[Series 13: T1 · sagittal · 5.0mm · 0.75mm/px · 2 of 23 slices shown]
[im 1/23]
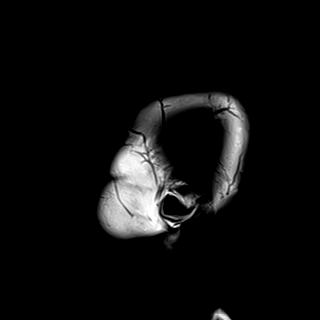
[im 23/23]
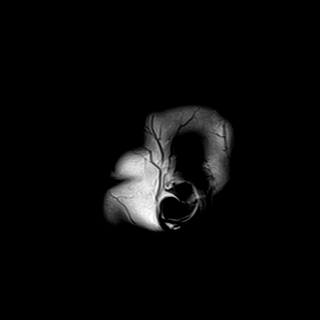

[Series 14: T2 · axial · 5.0mm · 0.72mm/px · z∈[-69,+74]mm · 2 of 25 slices shown (1 of 2)]
[im 1/25]
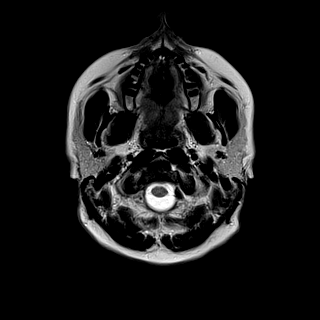
[im 25/25]
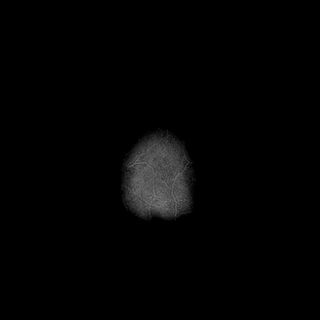

[Series 15: FLAIR · axial · 5.0mm · 0.45mm/px · z∈[-68,+75]mm · 2 of 25 slices shown]
[im 1/25]
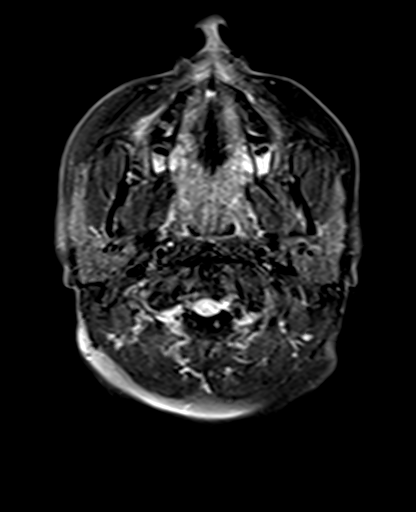
[im 25/25]
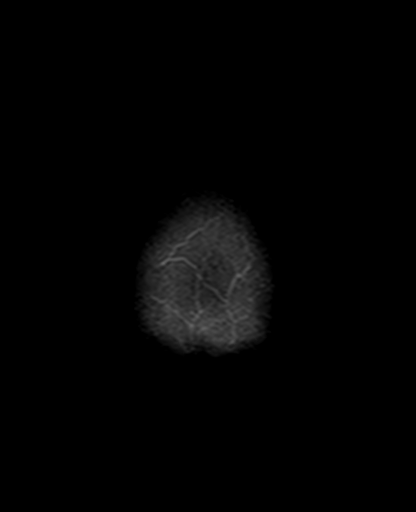

[Series 17: pha_images · axial · 3.0mm · 0.90mm/px · z∈[-84,+92]mm · 4 of 60 slices shown]
[im 1/60]
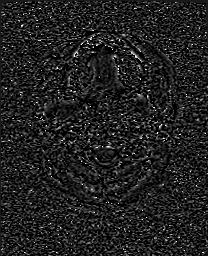
[im 20/60]
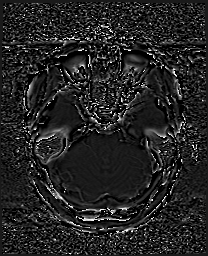
[im 40/60]
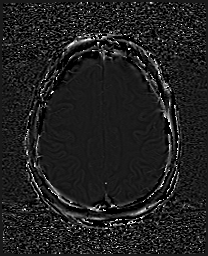
[im 60/60]
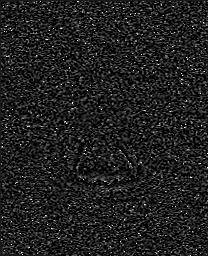

[Series 18: swi_images · axial · 3.0mm · 0.90mm/px · z∈[-84,+92]mm · 4 of 60 slices shown]
[im 1/60]
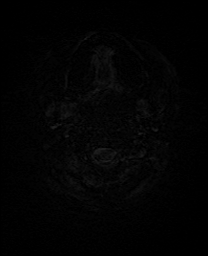
[im 20/60]
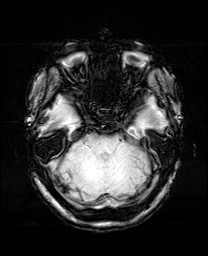
[im 40/60]
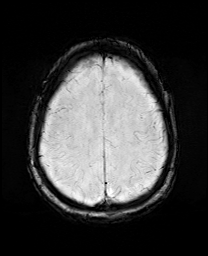
[im 60/60]
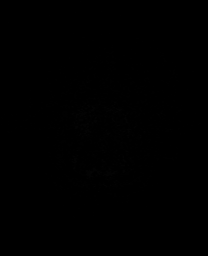

[Series 21: T2 · coronal · 5.0mm · 0.34mm/px · 2 of 29 slices shown (2 of 2)]
[im 1/29]
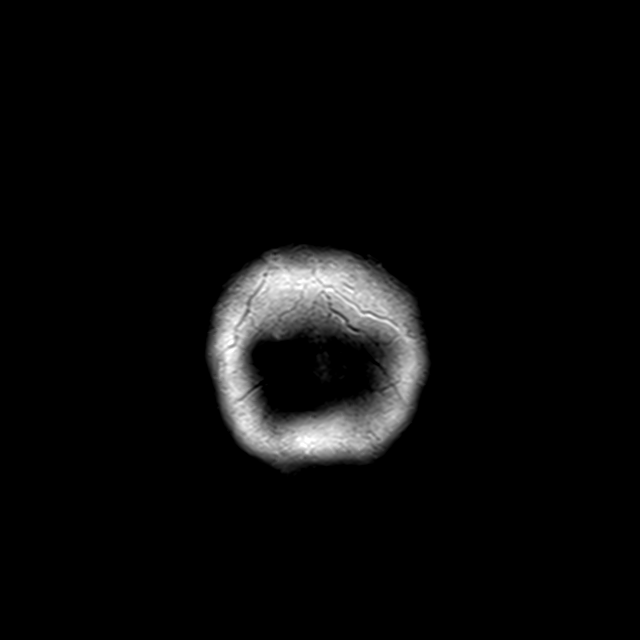
[im 29/29]
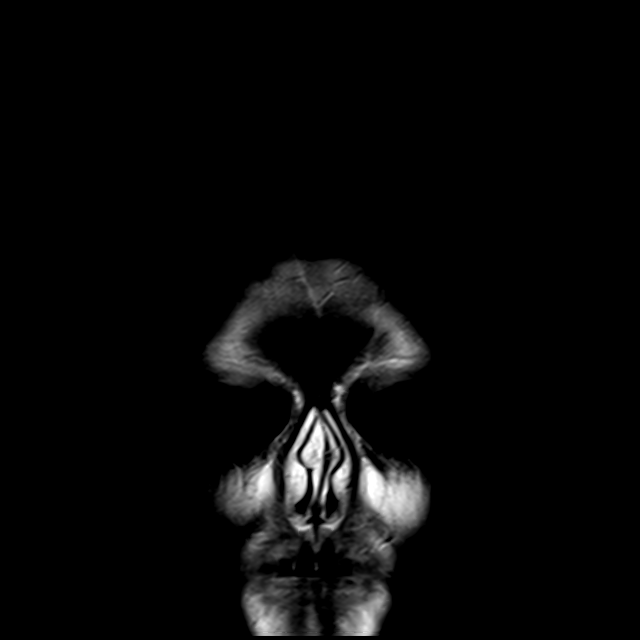

[32 of 48 positions shown; findings below may reference images not displayed]

FINDINGS: MRI HEAD FINDINGS

BRAIN: No acute infarct, acute hemorrhage or extra-axial collection.
Normal white matter signal. Normal volume of CSF spaces. No chronic
microhemorrhage. Normal midline structures.

VASCULAR: Major flow voids are preserved.

SKULL AND UPPER CERVICAL SPINE: Normal calvarium and skull base.
Visualized upper cervical spine and soft tissues are normal.

SINUSES/ORBITS: No paranasal sinus fluid levels or advanced mucosal
thickening. No mastoid or middle ear effusion. Normal orbits.

MRA HEAD FINDINGS

POSTERIOR CIRCULATION:

--Vertebral arteries: Normal V4 segments.

--Inferior cerebellar arteries: Normal.

--Basilar artery: Normal.

--Superior cerebellar arteries: Normal.

--Posterior cerebral arteries: Normal.

ANTERIOR CIRCULATION:

--Intracranial internal carotid arteries: Normal.

--Anterior cerebral arteries (ACA): Normal. Both A1 segments are
present. Patent anterior communicating artery (a-comm).

--Middle cerebral arteries (MCA): Normal.
IMPRESSION: Normal MRI/MRA of the brain.

## 2020-04-29 IMAGING — CT CT ANGIO NECK
1 of 8 series · 8 of 33 positions shown · IV contrast (APPLIED)
Comparison: Noncontrast head CT performed earlier the same day
[DATE].
COMPARISON: Noncontrast head CT performed earlier the same day
[DATE].

Addendum:
CLINICAL DATA: Stroke/TIA, assess intracranial arteries.
Right-sided weakness.

EXAM:
CT ANGIOGRAPHY HEAD AND NECK
TECHNIQUE: Multidetector CT imaging of the head and neck was performed using
the standard protocol during bolus administration of intravenous
contrast. Multiplanar CT image reconstructions and MIPs were
obtained to evaluate the vascular anatomy. Carotid stenosis
measurements (when applicable) are obtained utilizing NASCET
criteria, using the distal internal carotid diameter as the
denominator.
CONTRAST:  50mL OMNIPAQUE IOHEXOL 350 MG/ML SOLN

[Series 8: ax thins · axial · 0.39mm/px · z∈[+196,+442]mm · 8 of 318 slices shown]
[im 36/318  soft-tissue]
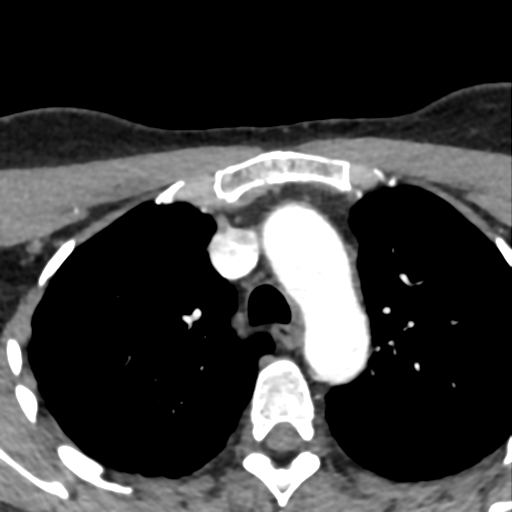
[im 71/318  bone]
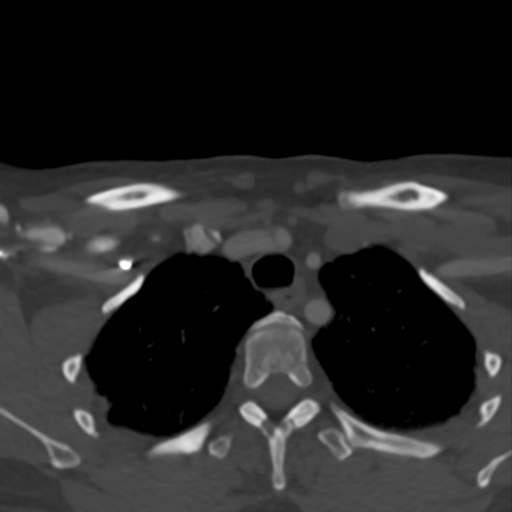
[im 106/318  soft-tissue]
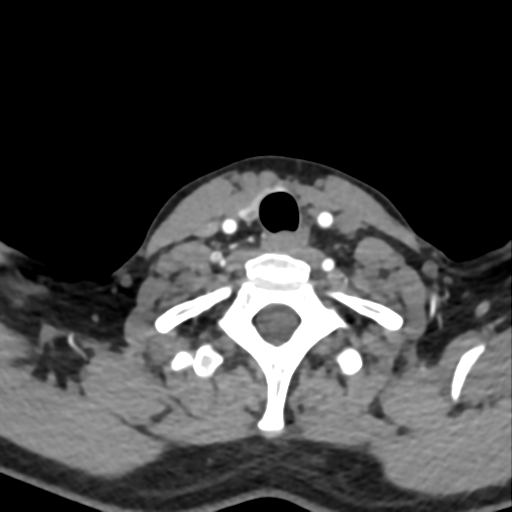
[im 141/318  bone]
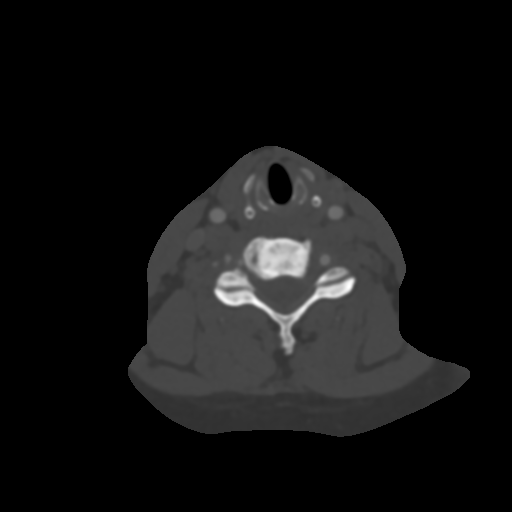
[im 177/318  soft-tissue]
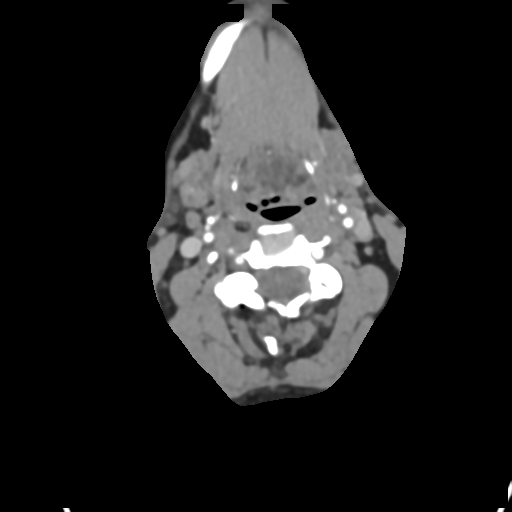
[im 212/318  bone]
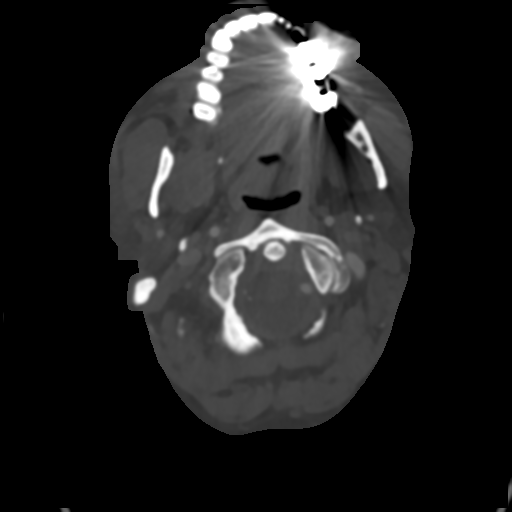
[im 247/318  soft-tissue]
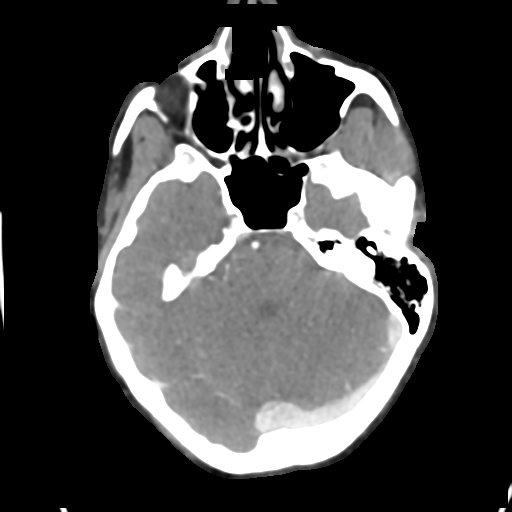
[im 282/318  bone]
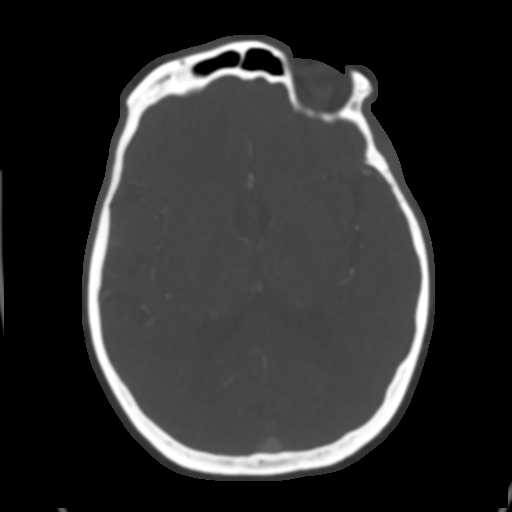

[8 of 33 positions shown; findings below may reference images not displayed]

FINDINGS: CTA NECK FINDINGS

Aortic arch: Standard aortic branching. The visualized aortic arch
is unremarkable. No hemodynamically significant innominate or
proximal subclavian artery stenosis.

Right carotid system: CCA and ICA patent within the neck without
stenosis. No significant atherosclerotic disease.

Left carotid system: CCA and ICA patent within the neck without
stenosis. No significant atherosclerotic disease.

Vertebral arteries: Streak artifact from disc replacements at the
C5-C6 and C6-C7 levels somewhat limit evaluation of the cervical
vertebral arteries. Within this limitation, the vertebral arteries
are patent within the neck bilaterally without significant stenosis.
The left vertebral artery is significantly dominant.

Skeleton: No acute bony abnormality or aggressive osseous lesion.
Disc replacements are present at the C5-C6 and C6-C7 levels.
Superimposed cervical spondylosis. No appreciable high-grade bony
spinal canal narrowing.

Other neck: No neck mass or cervical lymphadenopathy. The left
thyroid lobe is poorly delineated and may be atrophic or surgically
absent

Upper chest: No consolidation within the imaged lung apices

Review of the MIP images confirms the above findings

CTA HEAD FINDINGS

Anterior circulation:

The intracranial internal carotid arteries are patent.

The M1 middle cerebral arteries are patent without significant
stenosis. There is atherosclerotic irregularity of the M2 and more
distal MCA branch vessels bilaterally. However, no M2 proximal
branch occlusion or high-grade proximal stenosis is identified.

The anterior cerebral arteries are patent.

Tiny 1 mm inferiorly projecting vascular protrusion arising from the
paraclinoid left ICA which may reflect an infundibulum or tiny
aneurysm (series 10, image 100).

Posterior circulation:

The dominant intracranial left vertebral artery is patent without
significant stenosis. The non dominant intracranial right vertebral
artery is markedly developmentally diminutive beyond the origin of
the right PICA with the distal right V4 vertebral artery poorly
delineated. A high-grade stenosis of the distal right vertebral
artery cannot be excluded. The basilar artery is patent.
Atherosclerotic irregularity of both posterior cerebral arteries.
Most notably, there is a high-grade focal stenosis within the left
posterior cerebral artery at the P2/P3 junction (series 11, image
12). Posterior communicating arteries are hypoplastic or absent
bilaterally.

Venous sinuses: Within limitations of contrast timing, no convincing
thrombus.

Anatomic variants: As described

Review of the MIP images confirms the above findings
IMPRESSION: CTA neck:

1. The bilateral common and internal carotid arteries are patent
within the neck without stenosis.
2. Streak artifact from disc replacements at the C5-C6 and C6-C7
levels limits evaluation of the cervical vertebral arteries. Within
this limitation, the vertebral artery are patent within the neck
without stenosis.

CTA head:

1. The non-dominant intracranial right vertebral artery is markedly
developmentally diminutive beyond the origin of the right PICA. The
very distal V4 right vertebral artery is poorly delineated and a
superimposed high-grade stenosis at this site cannot be excluded.
2. High-grade stenosis within the left posterior cerebral artery at
the P2/P3 junction. Background mild atherosclerotic irregularity of
both posterior cerebral arteries.
3. Mild atherosclerotic irregularity of the M2 and more distal MCA
branches bilaterally. No M2 proximal branch occlusion or high-grade
proximal stenosis is demonstrated.

ADDENDUM:
These results were called by telephone at the time of interpretation
on [DATE] at [DATE] to provider ACELA , who verbally
acknowledged these results.

*** End of Addendum ***
FINDINGS: CTA NECK FINDINGS

Aortic arch: Standard aortic branching. The visualized aortic arch
is unremarkable. No hemodynamically significant innominate or
proximal subclavian artery stenosis.

Right carotid system: CCA and ICA patent within the neck without
stenosis. No significant atherosclerotic disease.

Left carotid system: CCA and ICA patent within the neck without
stenosis. No significant atherosclerotic disease.

Vertebral arteries: Streak artifact from disc replacements at the
C5-C6 and C6-C7 levels somewhat limit evaluation of the cervical
vertebral arteries. Within this limitation, the vertebral arteries
are patent within the neck bilaterally without significant stenosis.
The left vertebral artery is significantly dominant.

Skeleton: No acute bony abnormality or aggressive osseous lesion.
Disc replacements are present at the C5-C6 and C6-C7 levels.
Superimposed cervical spondylosis. No appreciable high-grade bony
spinal canal narrowing.

Other neck: No neck mass or cervical lymphadenopathy. The left
thyroid lobe is poorly delineated and may be atrophic or surgically
absent

Upper chest: No consolidation within the imaged lung apices

Review of the MIP images confirms the above findings

CTA HEAD FINDINGS

Anterior circulation:

The intracranial internal carotid arteries are patent.

The M1 middle cerebral arteries are patent without significant
stenosis. There is atherosclerotic irregularity of the M2 and more
distal MCA branch vessels bilaterally. However, no M2 proximal
branch occlusion or high-grade proximal stenosis is identified.

The anterior cerebral arteries are patent.

Tiny 1 mm inferiorly projecting vascular protrusion arising from the
paraclinoid left ICA which may reflect an infundibulum or tiny
aneurysm (series 10, image 100).

Posterior circulation:

The dominant intracranial left vertebral artery is patent without
significant stenosis. The non dominant intracranial right vertebral
artery is markedly developmentally diminutive beyond the origin of
the right PICA with the distal right V4 vertebral artery poorly
delineated. A high-grade stenosis of the distal right vertebral
artery cannot be excluded. The basilar artery is patent.
Atherosclerotic irregularity of both posterior cerebral arteries.
Most notably, there is a high-grade focal stenosis within the left
posterior cerebral artery at the P2/P3 junction (series 11, image
12). Posterior communicating arteries are hypoplastic or absent
bilaterally.

Venous sinuses: Within limitations of contrast timing, no convincing
thrombus.

Anatomic variants: As described

Review of the MIP images confirms the above findings
IMPRESSION: CTA neck:

1. The bilateral common and internal carotid arteries are patent
within the neck without stenosis.
2. Streak artifact from disc replacements at the C5-C6 and C6-C7
levels limits evaluation of the cervical vertebral arteries. Within
this limitation, the vertebral artery are patent within the neck
without stenosis.

CTA head:

1. The non-dominant intracranial right vertebral artery is markedly
developmentally diminutive beyond the origin of the right PICA. The
very distal V4 right vertebral artery is poorly delineated and a
superimposed high-grade stenosis at this site cannot be excluded.
2. High-grade stenosis within the left posterior cerebral artery at
the P2/P3 junction. Background mild atherosclerotic irregularity of
both posterior cerebral arteries.
3. Mild atherosclerotic irregularity of the M2 and more distal MCA
branches bilaterally. No M2 proximal branch occlusion or high-grade
proximal stenosis is demonstrated.

## 2020-04-29 MED ORDER — ASPIRIN 325 MG PO TABS
325.0000 mg | ORAL_TABLET | Freq: Every day | ORAL | Status: DC
  Administered 2020-04-30: 325 mg via ORAL
  Filled 2020-04-29: qty 1

## 2020-04-29 MED ORDER — ATORVASTATIN CALCIUM 40 MG PO TABS
40.0000 mg | ORAL_TABLET | Freq: Every day | ORAL | Status: DC
  Administered 2020-04-30: 40 mg via ORAL
  Filled 2020-04-29: qty 1

## 2020-04-29 MED ORDER — SODIUM CHLORIDE 0.9 % IV SOLN: INTRAVENOUS | Status: DC

## 2020-04-29 MED ORDER — ACETAMINOPHEN 325 MG PO TABS: 650.0000 mg | ORAL_TABLET | ORAL | Status: DC | PRN

## 2020-04-29 MED ORDER — CLOPIDOGREL BISULFATE 75 MG PO TABS
75.0000 mg | ORAL_TABLET | Freq: Every day | ORAL | Status: DC
  Administered 2020-04-29: 75 mg via ORAL
  Filled 2020-04-29: qty 1

## 2020-04-29 MED ORDER — STROKE: EARLY STAGES OF RECOVERY BOOK
Freq: Once | Status: DC
  Filled 2020-04-29: qty 1

## 2020-04-29 MED ORDER — SENNOSIDES-DOCUSATE SODIUM 8.6-50 MG PO TABS: 1.0000 | ORAL_TABLET | Freq: Every evening | ORAL | Status: DC | PRN

## 2020-04-29 MED ORDER — ACETAMINOPHEN 160 MG/5ML PO SOLN: 650.0000 mg | ORAL | Status: DC | PRN

## 2020-04-29 MED ORDER — ACETAMINOPHEN 650 MG RE SUPP: 650.0000 mg | RECTAL | Status: DC | PRN

## 2020-04-29 MED ORDER — IOHEXOL 350 MG/ML SOLN
50.0000 mL | Freq: Once | INTRAVENOUS | Status: AC | PRN
  Administered 2020-04-29: 50 mL via INTRAVENOUS

## 2020-04-29 MED ORDER — ENOXAPARIN SODIUM 40 MG/0.4ML ~~LOC~~ SOLN
40.0000 mg | SUBCUTANEOUS | Status: DC
  Administered 2020-04-29: 40 mg via SUBCUTANEOUS
  Filled 2020-04-29: qty 0.4

## 2020-04-29 MED ORDER — CLOPIDOGREL BISULFATE 75 MG PO TABS: 75.0000 mg | ORAL_TABLET | Freq: Every day | ORAL | Status: DC

## 2020-04-29 NOTE — ED Notes (Signed)
Md paged d/t pt passing swallow screen

## 2020-04-29 NOTE — ED Provider Notes (Signed)
[facility] EMERGENCY DEPARTMENT Provider Note   CSN: [id number] Arrival date & time: 04/29/20  1934  An emergency department physician performed an initial assessment on this suspected stroke patient at 30.  History Chief Complaint  Patient presents with  . Weakness  . Code Stroke    Dorothy Wood is a 59 y.o. female.  The history is provided by medical records and the patient. No language interpreter was used.  Neurologic Problem This is a new problem. The current episode started less than 1 hour ago. The problem occurs constantly. The problem has not changed since onset.Pertinent negatives include no chest pain, no abdominal pain, no headaches and no shortness of breath. Nothing aggravates the symptoms. Nothing relieves the symptoms. She has tried nothing for the symptoms. The treatment provided no relief.       Past Medical History:  Diagnosis Date  . Cancer (HCC)    OVARIAN  . Colonic polyp   . Hyperlipidemia   . Hypertension   . Sjogren's disease ([place])   . Thyroid disease    HYPOTHYROIDISM    Patient Active Problem List   Diagnosis Date Noted  . Hormone replacement therapy (HRT) 09/09/2016  . Hypothyroidism 07/16/2014  . Sjogren's syndrome ([place]) 04/11/2013  . History of ovarian cancer 04/11/2013  . Personal history of colonic polyps 04/11/2013  . ADD (attention deficit disorder) 07/06/2012  . Dysthymia 07/06/2012    Past Surgical History:  Procedure Laterality Date  . ABDOMINAL HYSTERECTOMY     Ovarian cancer  . BREAST CYST ASPIRATION    . CESAREAN SECTION     x2  . COLONOSCOPY  10-06   Dr. Earnest Bailey  . TONSILLECTOMY       OB History   No obstetric history on file.     Family History  Problem Relation Age of Onset  . Mental illness Mother   . Hypertension Mother   . Heart failure Mother   . Mental illness Sister   . Mental illness Brother   . Mental illness Maternal Uncle   . Mental illness Maternal Grandmother   .  Cancer Paternal Grandmother   . CAD Father        CABG at age 23    Social History   Tobacco Use  . Smoking status: Never Smoker  . Smokeless tobacco: Never Used  Substance Use Topics  . Alcohol use: Yes    Comment: 6 glasses per week  . Drug use: No    Home Medications Prior to Admission medications   Medication Sig Start Date End Date Taking? Authorizing Provider  buPROPion (WELLBUTRIN XL) 300 MG 24 hr tablet Take 1 tablet (300 mg total) by mouth daily. 04/21/18   Denita Lung, MD  estradiol (ESTRACE) 1 MG tablet Take 1 mg by mouth daily.    [provider]  estrogens-methylTEST 1.25-2.5 MG TABS per tablet Take 1 tablet by mouth daily.    [provider]  guanFACINE (TENEX) 2 MG tablet Take 2 mg by mouth at bedtime.    [provider]  hydrochlorothiazide (HYDRODIURIL) 25 MG tablet TAKE 1 TABLET BY MOUTH ONCE DAILY 12/26/19   Lelon Perla, MD  levothyroxine (SYNTHROID) 88 MCG tablet Take 88 mcg by mouth daily. 11/27/19   [provider]  methylphenidate (RITALIN) 20 MG tablet Take 1 tablet (20 mg total) by mouth 3 (three) times daily. 09/29/18   Denita Lung, MD  pilocarpine (SALAGEN) 5 MG tablet TAKE 1 TABLET BY MOUTH  FOUR TIMES DAILY 12/25/19   Denita Lung, MD    Allergies    Patient has no known allergies.  Review of Systems   Review of Systems  Constitutional: Negative for chills, diaphoresis, fatigue and fever.  HENT: Negative for congestion.   Eyes: Negative for photophobia and visual disturbance.  Respiratory: Negative for cough, chest tightness, shortness of breath and wheezing.   Cardiovascular: Negative for chest pain, palpitations and leg swelling.  Gastrointestinal: Negative for abdominal pain, constipation, diarrhea, nausea and rectal pain.  Genitourinary: Negative for flank pain.  Musculoskeletal: Negative for back pain, neck pain and neck stiffness.  Neurological: Positive for numbness. Negative for dizziness,  seizures, syncope, facial asymmetry, speech difficulty, light-headedness and headaches.  Psychiatric/Behavioral: Negative for agitation.  All other systems reviewed and are negative.   Physical Exam Updated Vital Signs BP (!) 139/102 (BP Location: Right Arm)   Pulse 88   Temp 98.6 F (37 C) (Oral)   Resp 16   Ht 5' (1.524 m)   Wt 59 kg   SpO2 99%   BMI 25.40 kg/m   Physical Exam Vitals and nursing note reviewed.  Constitutional:      General: She is not in acute distress.    Appearance: She is well-developed. She is not ill-appearing, toxic-appearing or diaphoretic.  HENT:     Head: Normocephalic and atraumatic.     Right Ear: External ear normal.     Left Ear: External ear normal.     Nose: Nose normal.     Mouth/Throat:     Mouth: Mucous membranes are moist.     Pharynx: No oropharyngeal exudate or posterior oropharyngeal erythema.  Eyes:     Extraocular Movements: Extraocular movements intact.     Conjunctiva/sclera: Conjunctivae normal.     Pupils: Pupils are equal, round, and reactive to light.  Neck:     Vascular: No carotid bruit.  Cardiovascular:     Rate and Rhythm: Normal rate.     Pulses: Normal pulses.     Heart sounds: No murmur heard.   Pulmonary:     Effort: Pulmonary effort is normal. No respiratory distress.     Breath sounds: No stridor. No wheezing, rhonchi or rales.  Chest:     Chest wall: No tenderness.  Abdominal:     General: Abdomen is flat. There is no distension.     Tenderness: There is no abdominal tenderness. There is no rebound.  Musculoskeletal:        General: No tenderness or signs of injury.     Cervical back: Normal range of motion and neck supple. No rigidity or tenderness.     Right lower leg: No edema.     Left lower leg: No edema.  Lymphadenopathy:     Cervical: No cervical adenopathy.  Skin:    General: Skin is warm.     Capillary Refill: Capillary refill takes less than 2 seconds.     Coloration: Skin is not pale.      Findings: No erythema or rash.  Neurological:     Mental Status: She is alert and oriented to person, place, and time.     GCS: GCS eye subscore is 4. GCS verbal subscore is 5. GCS motor subscore is 6.     Cranial Nerves: No dysarthria or facial asymmetry.     Sensory: Sensory deficit present.     Motor: Weakness present. No tremor, atrophy or abnormal muscle tone.     Coordination: Coordination normal. Finger-Nose-Finger  Test normal.     Gait: Gait normal.     Deep Tendon Reflexes: Reflexes are normal and symmetric.     Comments: Right-sided numbness in face, arm, and leg compared to left.  No facial droop.  Clear speech.  Pupils symmetric and reactive with normal extraocular movements.  Normal finger-nose-finger testing bilaterally.  Slightly decreased strength and extension of the right knee compared to left.  Otherwise unremarkable neurologic exam.  Normal gait.  Psychiatric:        Mood and Affect: Mood normal.     ED Results / Procedures / Treatments   Labs (all labs ordered are listed, but only abnormal results are displayed) Labs Reviewed  COMPREHENSIVE METABOLIC PANEL - Abnormal; Notable for the following components:      Result Value   Glucose, Bld 102 (*)    All other components within normal limits  RAPID URINE DRUG SCREEN, HOSP PERFORMED - Abnormal; Notable for the following components:   Benzodiazepines POSITIVE (*)    All other components within normal limits  URINALYSIS, ROUTINE W REFLEX MICROSCOPIC - Abnormal; Notable for the following components:   APPearance CLOUDY (*)    Specific Gravity, Urine 1.044 (*)    Hgb urine dipstick SMALL (*)    Ketones, ur 5 (*)    Leukocytes,Ua LARGE (*)    Bacteria, UA RARE (*)    All other components within normal limits  CBG MONITORING, ED - Abnormal; Notable for the following components:   Glucose-Capillary 104 (*)    All other components within normal limits  I-STAT CHEM 8, ED - Abnormal; Notable for the following  components:   BUN 21 (*)    All other components within normal limits  SARS CORONAVIRUS 2 BY RT PCR (HOSPITAL ORDER, [place] [place] [facility])  ETHANOL  PROTIME-INR  APTT  CBC  DIFFERENTIAL  CBC  CREATININE, SERUM  HIV ANTIBODY (ROUTINE TESTING W REFLEX)  HEMOGLOBIN A1C  LIPID PANEL  CBC  COMPREHENSIVE METABOLIC PANEL  I-STAT BETA HCG BLOOD, ED ([facility], [facility], [facility] ONLY)    EKG EKG Interpretation  Date/Time:  Monday April 29 2020 20:34:09 EDT Ventricular Rate:  79 PR Interval:    QRS Duration: 102 QT Interval:  399 QTC Calculation: 458 R Axis:   -21 Text Interpretation: Sinus rhythm Borderline left axis deviation RSR' in V1 or V2, probably normal variant Minimal ST elevation, inferior leads When comapred to prior, no significant cahnges seen. No STEMI Confirmed by Antony Blackbird [phone]) on 04/29/2020 8:35:37 PM   Radiology CT Angio Neck W and/or Wo Contrast  Addendum Date: 04/29/2020   ADDENDUM REPORT: 04/29/2020 21:58 ADDENDUM: These results were called by telephone at the time of interpretation on 04/29/2020 at 8:58 pm to provider [facility] , who verbally acknowledged these results. Electronically Signed   By: Kellie Simmering DO   On: 04/29/2020 21:58   Result Date: 04/29/2020 CLINICAL DATA:  Stroke/TIA, assess intracranial arteries. Right-sided weakness. EXAM: CT ANGIOGRAPHY HEAD AND NECK TECHNIQUE: Multidetector CT imaging of the head and neck was performed using the standard protocol during bolus administration of intravenous contrast. Multiplanar CT image reconstructions and MIPs were obtained to evaluate the vascular anatomy. Carotid stenosis measurements (when applicable) are obtained utilizing NASCET criteria, using the distal internal carotid diameter as the denominator. CONTRAST:  47mL OMNIPAQUE IOHEXOL 350 MG/ML SOLN COMPARISON:  Noncontrast head CT performed earlier the same day 04/29/2020. FINDINGS: CTA NECK FINDINGS Aortic arch: Standard aortic branching.  The visualized aortic arch is  unremarkable. No hemodynamically significant innominate or proximal subclavian artery stenosis. Right carotid system: CCA and ICA patent within the neck without stenosis. No significant atherosclerotic disease. Left carotid system: CCA and ICA patent within the neck without stenosis. No significant atherosclerotic disease. Vertebral arteries: Streak artifact from disc replacements at the C5-C6 and C6-C7 levels somewhat limit evaluation of the cervical vertebral arteries. Within this limitation, the vertebral arteries are patent within the neck bilaterally without significant stenosis. The left vertebral artery is significantly dominant. Skeleton: No acute bony abnormality or aggressive osseous lesion. Disc replacements are present at the C5-C6 and C6-C7 levels. Superimposed cervical spondylosis. No appreciable high-grade bony spinal canal narrowing. Other neck: No neck mass or cervical lymphadenopathy. The left thyroid lobe is poorly delineated and may be atrophic or surgically absent Upper chest: No consolidation within the imaged lung apices Review of the MIP images confirms the above findings CTA HEAD FINDINGS Anterior circulation: The intracranial internal carotid arteries are patent. The M1 middle cerebral arteries are patent without significant stenosis. There is atherosclerotic irregularity of the M2 and more distal MCA branch vessels bilaterally. However, no M2 proximal branch occlusion or high-grade proximal stenosis is identified. The anterior cerebral arteries are patent. Tiny 1 mm inferiorly projecting vascular protrusion arising from the paraclinoid left ICA which may reflect an infundibulum or tiny aneurysm (series 10, image 100). Posterior circulation: The dominant intracranial left vertebral artery is patent without significant stenosis. The non dominant intracranial right vertebral artery is markedly developmentally diminutive beyond the origin of the right PICA with  the distal right V4 vertebral artery poorly delineated. A high-grade stenosis of the distal right vertebral artery cannot be excluded. The basilar artery is patent. Atherosclerotic irregularity of both posterior cerebral arteries. Most notably, there is a high-grade focal stenosis within the left posterior cerebral artery at the P2/P3 junction (series 11, image 12). Posterior communicating arteries are hypoplastic or absent bilaterally. Venous sinuses: Within limitations of contrast timing, no convincing thrombus. Anatomic variants: As described Review of the MIP images confirms the above findings IMPRESSION: CTA neck: 1. The bilateral common and internal carotid arteries are patent within the neck without stenosis. 2. Streak artifact from disc replacements at the C5-C6 and C6-C7 levels limits evaluation of the cervical vertebral arteries. Within this limitation, the vertebral artery are patent within the neck without stenosis. CTA head: 1. The non-dominant intracranial right vertebral artery is markedly developmentally diminutive beyond the origin of the right PICA. The very distal V4 right vertebral artery is poorly delineated and a superimposed high-grade stenosis at this site cannot be excluded. 2. High-grade stenosis within the left posterior cerebral artery at the P2/P3 junction. Background mild atherosclerotic irregularity of both posterior cerebral arteries. 3. Mild atherosclerotic irregularity of the M2 and more distal MCA branches bilaterally. No M2 proximal branch occlusion or high-grade proximal stenosis is demonstrated. Electronically Signed: By: Kellie Simmering DO On: 04/29/2020 20:54   CT HEAD CODE STROKE WO CONTRAST  Addendum Date: 04/29/2020   ADDENDUM REPORT: 04/29/2020 20:33 ADDENDUM: These results were called by telephone at the time of interpretation on 04/29/2020 at 8:32 pm to provider Dr. Mil[facility] Gain , who verbally acknowledged these results. Electronically Signed   By: Ulyses Jarred M.D.   On:  04/29/2020 20:33   Result Date: 04/29/2020 CLINICAL DATA:  Code stroke.  Right-sided weakness and numbness EXAM: CT HEAD WITHOUT CONTRAST TECHNIQUE: Contiguous axial images were obtained from the base of the skull through the vertex without intravenous contrast. COMPARISON:  None. FINDINGS:  Brain: There is no mass, hemorrhage or extra-axial collection. The size and configuration of the ventricles and extra-axial CSF spaces are normal. The brain parenchyma is normal, without evidence of acute or chronic infarction. Vascular: No abnormal hyperdensity of the major intracranial arteries or dural venous sinuses. No intracranial atherosclerosis. Skull: The visualized skull base, calvarium and extracranial soft tissues are normal. Sinuses/Orbits: No fluid levels or advanced mucosal thickening of the visualized paranasal sinuses. No mastoid or middle ear effusion. The orbits are normal. ASPECTS [facility] Stroke Program Early CT Score) - Ganglionic level infarction (caudate, lentiform nuclei, internal capsule, insula, M1-M3 cortex): 7 - Supraganglionic infarction (M4-M6 cortex): 3 Total score (0-10 with 10 being normal): 10 IMPRESSION: 1. Normal head CT. 2. ASPECTS is 10. Dr. [facility] Gain paged at [place] PM. Electronically Signed: By: Ulyses Jarred M.D. On: 04/29/2020 20:14   CT ANGIO HEAD CODE STROKE  Addendum Date: 04/29/2020   ADDENDUM REPORT: 04/29/2020 21:58 ADDENDUM: These results were called by telephone at the time of interpretation on 04/29/2020 at 8:58 pm to provider [facility] , who verbally acknowledged these results. Electronically Signed   By: Kellie Simmering DO   On: 04/29/2020 21:58   Result Date: 04/29/2020 CLINICAL DATA:  Stroke/TIA, assess intracranial arteries. Right-sided weakness. EXAM: CT ANGIOGRAPHY HEAD AND NECK TECHNIQUE: Multidetector CT imaging of the head and neck was performed using the standard protocol during bolus administration of intravenous contrast. Multiplanar CT image reconstructions and  MIPs were obtained to evaluate the vascular anatomy. Carotid stenosis measurements (when applicable) are obtained utilizing NASCET criteria, using the distal internal carotid diameter as the denominator. CONTRAST:  35mL OMNIPAQUE IOHEXOL 350 MG/ML SOLN COMPARISON:  Noncontrast head CT performed earlier the same day 04/29/2020. FINDINGS: CTA NECK FINDINGS Aortic arch: Standard aortic branching. The visualized aortic arch is unremarkable. No hemodynamically significant innominate or proximal subclavian artery stenosis. Right carotid system: CCA and ICA patent within the neck without stenosis. No significant atherosclerotic disease. Left carotid system: CCA and ICA patent within the neck without stenosis. No significant atherosclerotic disease. Vertebral arteries: Streak artifact from disc replacements at the C5-C6 and C6-C7 levels somewhat limit evaluation of the cervical vertebral arteries. Within this limitation, the vertebral arteries are patent within the neck bilaterally without significant stenosis. The left vertebral artery is significantly dominant. Skeleton: No acute bony abnormality or aggressive osseous lesion. Disc replacements are present at the C5-C6 and C6-C7 levels. Superimposed cervical spondylosis. No appreciable high-grade bony spinal canal narrowing. Other neck: No neck mass or cervical lymphadenopathy. The left thyroid lobe is poorly delineated and may be atrophic or surgically absent Upper chest: No consolidation within the imaged lung apices Review of the MIP images confirms the above findings CTA HEAD FINDINGS Anterior circulation: The intracranial internal carotid arteries are patent. The M1 middle cerebral arteries are patent without significant stenosis. There is atherosclerotic irregularity of the M2 and more distal MCA branch vessels bilaterally. However, no M2 proximal branch occlusion or high-grade proximal stenosis is identified. The anterior cerebral arteries are patent. Tiny 1 mm  inferiorly projecting vascular protrusion arising from the paraclinoid left ICA which may reflect an infundibulum or tiny aneurysm (series 10, image 100). Posterior circulation: The dominant intracranial left vertebral artery is patent without significant stenosis. The non dominant intracranial right vertebral artery is markedly developmentally diminutive beyond the origin of the right PICA with the distal right V4 vertebral artery poorly delineated. A high-grade stenosis of the distal right vertebral artery cannot be excluded. The basilar artery is patent.  Atherosclerotic irregularity of both posterior cerebral arteries. Most notably, there is a high-grade focal stenosis within the left posterior cerebral artery at the P2/P3 junction (series 11, image 12). Posterior communicating arteries are hypoplastic or absent bilaterally. Venous sinuses: Within limitations of contrast timing, no convincing thrombus. Anatomic variants: As described Review of the MIP images confirms the above findings IMPRESSION: CTA neck: 1. The bilateral common and internal carotid arteries are patent within the neck without stenosis. 2. Streak artifact from disc replacements at the C5-C6 and C6-C7 levels limits evaluation of the cervical vertebral arteries. Within this limitation, the vertebral artery are patent within the neck without stenosis. CTA head: 1. The non-dominant intracranial right vertebral artery is markedly developmentally diminutive beyond the origin of the right PICA. The very distal V4 right vertebral artery is poorly delineated and a superimposed high-grade stenosis at this site cannot be excluded. 2. High-grade stenosis within the left posterior cerebral artery at the P2/P3 junction. Background mild atherosclerotic irregularity of both posterior cerebral arteries. 3. Mild atherosclerotic irregularity of the M2 and more distal MCA branches bilaterally. No M2 proximal branch occlusion or high-grade proximal stenosis is  demonstrated. Electronically Signed: By: Kellie Simmering DO On: 04/29/2020 20:54    Procedures Procedures (including critical care time)  Medications Ordered in ED Medications   stroke: mapping our early stages of recovery book (0 each Does not apply Hold 04/29/20 2238)  0.9 %  sodium chloride infusion ( Intravenous New Bag/Given 04/29/20 2255)  acetaminophen (TYLENOL) tablet 650 mg (has no administration in time range)    Or  acetaminophen (TYLENOL) 160 MG/5ML solution 650 mg (has no administration in time range)    Or  acetaminophen (TYLENOL) suppository 650 mg (has no administration in time range)  senna-docusate (Senokot-S) tablet 1 tablet (has no administration in time range)  enoxaparin (LOVENOX) injection 40 mg (40 mg Subcutaneous Given 04/29/20 2332)  aspirin tablet 325 mg (has no administration in time range)  atorvastatin (LIPITOR) tablet 40 mg (has no administration in time range)  clopidogrel (PLAVIX) tablet 75 mg (75 mg Oral Given 04/29/20 2332)  iohexol (OMNIPAQUE) 350 MG/ML injection 50 mL (50 mLs Intravenous Contrast Given 04/29/20 2026)    ED Course  I have reviewed the triage vital signs and the nursing notes.  Pertinent labs & imaging results that were available during my care of the patient were reviewed by me and considered in my medical decision making (see chart for details).    MDM Rules/Calculators/A&P                          Dorothy Wood is a very pleasant 59 y.o. female with a past medical history significant for prior ovarian cancer status post hysterectomy, hypertension, Sjogren's, hyperlipidemia, thyroid disease, previous neck surgery several months ago, and on hormone replacement therapy who presents for sudden onset of neurologic deficits.  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  She denies any headache, dizziness, vision changes, or speech difficulties.  She denies any facial droop.  She reports that previously she has had surgery on her neck back in May after she was having some right arm numbness that was different with neck positioning.  She reports  no symptoms since then.  She reports that her symptoms began exactly at 7 PM and due to the new facial numbness and right leg weakness and numbness, she decided come get evaluated to rule out stroke or TIA.  She reports the symptoms are unchanged.  On arrival, I was asked see the patient in triage.  I saw the patient and on my exam, she did have subjective numbness in the right face, right arm, and right leg compared to left.  Patient also had very mild subjective weakness in the right leg compared to the left.  She did not have weakness in her arms and had no pronator drift.  She also had no facial droop.  Her speech was clear and her pupils are symmetric and reactive normal extraocular movements.  She did not have any symptom variation with her neck positioning as it had been in the past prior to her surgery.  Her lungs are clear and chest was nontender.  Abdomen was nontender.  Exam otherwise unremarkable.  Code stroke was quickly called as she has new neurologic deficits starting within the last hour of presentation that I do not feel can be fully explained by her recent neck surgery.  Patient went to have CT head and CTA head and neck.  The neurologist recommended MRI if the CT imaging is reassuring.  If it is reassuring, he suspect she would likely be stable for discharge home however if there is abnormalities, she would likely admission for other stroke or TIA.  Anticipate reassessment after work-up.  9:04 PM The CTA does show some atherosclerosis and narrowing in her head and neck vasculature.  I spoke with the neurologist who does want her to be admitted for echo  and further TIA versus stroke work-up and admission.  He does agree with MRI of the head and neck which I ordered.  We will call for admission.   Final Clinical Impression(s) / ED Diagnoses Final diagnoses:  Numbness on right side     Clinical Impression: 1. Numbness on right side     Disposition: Admit  This note was prepared with assistance of Dragon voice recognition software. Occasional wrong-word or sound-a-like substitutions may have occurred due to the inherent limitations of voice recognition software.      Mendi Constable, Gwenyth Allegra, MD 04/29/20 [phone]

## 2020-04-29 NOTE — ED Notes (Signed)
Dr. Sherry Ruffing evaluating pt in triage.

## 2020-04-29 NOTE — Consult Note (Addendum)
NEUROLOGY CONSULTATION NOTE   Date of service: April 29, 2020 Patient Name: Dorothy Wood MRN:  [id number] DOB:  1960/12/26 Reason for consult: "stroke code"  History of Present Illness  Dorothy Wood is a 59 y.o. female with PMH significant for HTN, HLD, Sjogren's disease, hypothyroidism who presents with acute onset left-sided numbness involving the left face, left arm and left leg.  She reports that she was at her baseline and cleaning up her house moving up and down when around 7 PM, she noted that she was dizzy which she described as spinning and then had a sudden onset left-sided numbness and some heaviness.  She came into the ED for further work-up.  She endorses she has degenerative disc disease in her cervical spine but typically not present with numbness in her left fingertips but gradually progresses upwards and this current episode is much different from that.  She denies any recent chiropractic manipulation, no neck trauma or injury, no trips to salon, no hyperextension of her neck.  She denies any arm or leg weakness, no facial droop, no problems with urination or bowel movements, no Lhermitte sign.  She denies any prior history of strokes, she does not smoke, no family history of strokes.  She does not take aspirin at home but took aspirin today prior to coming to the ED.  NIH stroke scale: 1 Last known well: 7 PM on 04/29/2020 TPA given: tPA was not offered as her symptoms were very mild and limited to just decrease sensation to touch to 80% on the left. Thrombectomy: Not a candidate for thrombectomy due to no LVO.   ROS   Constitutional Denies weight loss, fever and chills.  HEENT Denies changes in vision and hearing.  Respiratory Denies SOB and cough.  CV Denies palpitations and CP  GI Denies abdominal pain, nausea, vomiting and diarrhea.  GU Denies dysuria and urinary frequency.  MSK Denies myalgia and joint pain.  Skin Denies rash and pruritus.  Neurological Denies  headache and syncope.  Psychiatric Denies recent changes in mood. Denies anxiety and depression.   Past History   Past Medical History:  Diagnosis Date  . Cancer (HCC)    OVARIAN  . Colonic polyp   . Hyperlipidemia   . Hypertension   . Sjogren's disease ([place])   . Thyroid disease    HYPOTHYROIDISM   Past Surgical History:  Procedure Laterality Date  . ABDOMINAL HYSTERECTOMY     Ovarian cancer  . BREAST CYST ASPIRATION    . CESAREAN SECTION     x2  . COLONOSCOPY  10-06   Dr. Earnest Bailey  . TONSILLECTOMY     Family History  Problem Relation Age of Onset  . Mental illness Mother   . Hypertension Mother   . Heart failure Mother   . Mental illness Sister   . Mental illness Brother   . Mental illness Maternal Uncle   . Mental illness Maternal Grandmother   . Cancer Paternal Grandmother   . CAD Father        CABG at age 28   Social History   Socioeconomic History  . Marital status: Married    Spouse name: Not on file  . Number of children: 2  . Years of education: Not on file  . Highest education level: Not on file  Occupational History  . Not on file  Tobacco Use  . Smoking status: Never Smoker  . Smokeless tobacco: Never Used  Substance and Sexual Activity  .  Alcohol use: Yes    Comment: 6 glasses per week  . Drug use: No  . Sexual activity: Not Currently  Other Topics Concern  . Not on file  Social History Narrative  . Not on file   Social Determinants of Health   Financial Resource Strain:   . Difficulty of Paying Living Expenses:   Food Insecurity:   . Worried About Charity fundraiser in the Last Year:   . Arboriculturist in the Last Year:   Transportation Needs:   . Film/video editor (Medical):   Marland Kitchen Lack of Transportation (Non-Medical):   Physical Activity:   . Days of Exercise per Week:   . Minutes of Exercise per Session:   Stress:   . Feeling of Stress :   Social Connections:   . Frequency of Communication with Friends and Family:   .  Frequency of Social Gatherings with Friends and Family:   . Attends Religious Services:   . Active Member of Clubs or Organizations:   . Attends Archivist Meetings:   Marland Kitchen Marital Status:    No Known Allergies  Medications  (Not in a hospital admission)    Vitals  Temp:  [98.6 F (37 C)] 98.6 F (37 C) (08/16 1946) Pulse Rate:  [78-92] 78 (08/16 2228) Resp:  [15-26] 17 (08/16 2228) BP: (135-157)/(66-102) 139/71 (08/16 2228) SpO2:  [99 %-100 %] 99 % (08/16 2228) Weight:  [59 kg] 59 kg (08/16 1955)  Body mass index is 25.4 kg/m.  Physical Exam   General: Laying comfortably in bed; in no acute distress.  HENT: Normal oropharynx and mucosa. Normal external appearance of ears and nose. Neck: Supple, no pain or tenderness CV: No JVD. No peripheral edema. Pulmonary: Symmetric Chest rise. Normal respiratory effort. Abdomen: Soft to touch, non-tender Ext: No cyanosis, edema, or deformity  Skin: No rash. Normal palpation of skin.   Musculoskeletal: Normal digits and nails by inspection. No clubbing.  Neurologic Examination  Mental status/Cognition: Alert, oriented to self, place, month and year, good attention. Speech/language: Fluent, comprehension intact, object naming intact, repetition intact. Cranial nerves:   CN II Pupils equal and reactive to light, no VF deficits   CN III,IV,VI EOM intact, no gaze preference or deviation, no nystagmus   CN V normal sensation in V1, V2, and V3 segments bilaterally   CN VII no asymmetry, no nasolabial fold flattening   CN VIII normal hearing to speech   CN IX & X normal palatal elevation, no uvular deviation   CN XI 5/5 head turn and 5/5 shoulder shrug bilaterally   CN XII midline tongue protrusion   Motor:  Muscle bulk: normal, tone normal, pronator drift none Mvmt Root Nerve  Muscle Right Left Comments  SA C5/6 Ax Deltoid 5 5   EF C5/6 Mc Biceps 5 5   EE C6/7/8 Rad Triceps 5 5   WF C6/7 Med FCR 5 5   WE C7/8 PIN ECU 5 5    F Ab C8/T1 U ADM/FDI 5 5   HF L1/2/3 Fem Illopsoas 5 5   KE L2/3/4 Fem Quad 5 5   DF L4/5 D Peron Tib Ant 5 5   PF S1/2 Tibial Grc/Sol 5 5    Reflexes:  Right Left Comments  Pectoralis      Biceps (C5/6) 2 2   Brachioradialis (C5/6) 2 2    Triceps (C6/7) 2 2    Patellar (L3/4) 2 2    Achilles (S1)  2 2    Hoffman      Plantar     Jaw jerk    Sensation:  Light touch  decreased to 80% in left face, left arm and left leg   Pin prick  mildly decreased in left face, left arm and left leg   Temperature    Vibration   Proprioception    Coordination/Complex Motor:  - Finger to Nose intact bilaterally - Heel to shin intact bilaterally - Rapid alternating movement intact bilaterally. - Gait: Stride length normal. Arm swing normal. Base width normal.  Tandem gait intact.  She is able to walk on her toes.  Labs   Lab Results  Component Value Date   NA 139 04/29/2020   K 3.7 04/29/2020   CL 99 04/29/2020   CO2 26 04/29/2020   GLUCOSE 98 04/29/2020   BUN 21 (H) 04/29/2020   CREATININE 0.90 04/29/2020   CALCIUM 9.7 04/29/2020   ALBUMIN 4.0 04/29/2020   AST 30 04/29/2020   ALT 29 04/29/2020   ALKPHOS 61 04/29/2020   BILITOT 0.7 04/29/2020   GFRNONAA >60 04/29/2020   GFRAA >60 04/29/2020     Imaging and Diagnostic studies  I personally reviewed the CT head which did not demonstrate any large territory infarct or ICH.  CT angio with developmentally diminutive right vertebral artery.  Also notable for left PCA high-grade stenosis at P2/P3 junction.  Impression   Dorothy Wood is a 59 y.o. female with PMH significant for HTN, HLD, Sjogren's disease, hypothyroidism who presents with acute onset left-sided numbness involving the left face, left arm and left leg.  The acute onset of her symptoms and involvement of the face does raise some suspicion for potential minor ischemic stroke or a TIA. However, her symptoms are very mild and only restricted to sensory deficit.  Her NIH  stroke scale is a 1 and symptoms are mild, we therefore did not offer her tPA. She has no clear arterial dissection on the imaging.  I did consider potential cervical spine stenosis especially given that the trigeminal sensory ganglia extends down to C4 and can cause facial numbness in the setting of compression and her hx of DJD. However, she has no other signs concerning for a cord compression at this time with intact muscle strength, no bowel, bladder incontinence, no saddle anesthesia.  Recommendations   - Brain imaging- MRI Brain pending - MRI C spine without contrast. - Vascular imaging- CTA Brain/Neck with diminutive R Vert, P2/3 high grade stenosis - TTE - pending - Lipid panel - ordered  - Statin - pending - A1C - ordered  - Antithrombotic - DAPT for 21 days, then Aspirin only. - DVT ppx - SQ lovenox. - SBP goal - permissive hypertension first 24 h < 220/110. Hold home Anti HTN meds. - Telemetry monitoring for arrythmia - Swallow screen - Stroke education - PT/OT/SLP   ______________________________________________________________________   Thank you for the opportunity to take part in the care of this patient. If you have any further questions, please contact the neurology consultation attending.  Signed,  [facility] Pager Number [phone]

## 2020-04-29 NOTE — ED Notes (Signed)
Patient transported to MRI 

## 2020-04-29 NOTE — Code Documentation (Signed)
Responded to Code Stroke called on pt in triage at [facility] for R sensory loss, [id number]. Pt arrived at 1934 with c/o R sided numbness/heaviness. Code Stroke was then activated. NIH-1 for R sensory loss, CT head negative for acute changes. CTA-no LVO. Plan to admit for stroke work-up.

## 2020-04-29 NOTE — H&P (Signed)
History and Physical   Dorothy Wood LZJ:[id number] DOB: 07-06-1961 DOA: 04/29/2020  Referring MD/NP/PA: Dr. Sherry Ruffing  PCP: Denita Lung, MD   Outpatient Specialists: None  Patient coming from: Home  Chief Complaint: Right-sided numbness  HPI: Dorothy Wood is a 59 y.o. female with medical history significant of hypertension, Sjogren's syndrome, hyperlipidemia, hypothyroidism, ovarian cancer status post total abdominal hysterectomy and on hormone replacement therapy who presented with sudden onset of right facial and upper extremity numbness that happened about an hour of prior to coming to the ER.  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  Also weakness in her right lower extremity felt like she was unsteady but did not fall.  Did not pass out.  Denied any visual changes did not have dizziness or fell to the side.  She initially thought it was her neck pain from previous surgery.  Patient decided to seek help and call 911 came to the ER within an hour of onset of symptoms.  A code stroke was called for.  She was evaluated and initial head CT without contrast is negative.  Neurology consulted and TIA versus CVA suspected.  Patient has been admitted to the hospital for stroke work-up.  She is still having numbness especially in her hand.  Her speech is unchanged and no facial droop..  ED Course: Temperature 98.6 blood pressure 157/75 pulse 92 respirate 26 oxygen sat 99% room air.  CBC and chemistry largely within normal except for BUN of 21.  INR 1.0.  Urinalysis showed cloudy urine with WBC 11-20 rare bacteria large leukocytes.  Urine drug screen is positive for benzodiazepines.  Head CT without contrast is negative CT angiogram of the head and neck showed high-grade stenosis within the left posterior cerebral artery in the P2 P3 junction.  There is some irregularity of the M2 and more distal MCA  branches bilaterally.  Patient being admitted for evaluation and treatment for of TIA.  Review of Systems: As per HPI otherwise 10 point review of systems negative.    Past Medical History:  Diagnosis Date  . Cancer (HCC)    OVARIAN  . Colonic polyp   . Hyperlipidemia   . Hypertension   . Sjogren's disease ([place])   . Thyroid disease    HYPOTHYROIDISM    Past Surgical History:  Procedure Laterality Date  . ABDOMINAL HYSTERECTOMY     Ovarian cancer  . BREAST CYST ASPIRATION    . CESAREAN SECTION     x2  . COLONOSCOPY  10-06   Dr. Earnest Bailey  . TONSILLECTOMY       reports that she has never smoked. She has never used smokeless tobacco. She reports current alcohol use. She reports that she does not use drugs.  No Known Allergies  Family History  Problem Relation Age of Onset  . Mental illness Mother   . Hypertension Mother   . Heart failure Mother   . Mental illness Sister   . Mental illness Brother   . Mental illness Maternal Uncle   . Mental illness Maternal Grandmother   . Cancer Paternal Grandmother   . CAD Father        CABG at age 77     Prior to Admission medications   Medication Sig Start Date End Date Taking? Authorizing Provider  buPROPion (WELLBUTRIN XL) 300 MG 24 hr tablet Take 1 tablet (  300 mg total) by mouth daily. 04/21/18  Yes Denita Lung, MD  estradiol (ESTRACE) 1 MG tablet Take 1 mg by mouth daily.   Yes [provider]  estrogens-methylTEST 1.25-2.5 MG TABS per tablet Take 1 tablet by mouth daily.   Yes [provider]  guanFACINE (TENEX) 2 MG tablet Take 2 mg by mouth in the morning and at bedtime.    Yes [provider]  hydrochlorothiazide (HYDRODIURIL) 25 MG tablet TAKE 1 TABLET BY MOUTH ONCE DAILY Patient taking differently: Take 25 mg by mouth daily.  12/26/19  Yes Lelon Perla, MD  levothyroxine (SYNTHROID) 88 MCG tablet Take 88 mcg by mouth daily. 11/27/19  Yes [provider]  methylphenidate  (RITALIN) 20 MG tablet Take 1 tablet (20 mg total) by mouth 3 (three) times daily. 09/29/18  Yes Denita Lung, MD  MYRBETRIQ 50 MG TB24 tablet Take 50 mg by mouth daily. 04/11/20  Yes [provider]  pilocarpine (SALAGEN) 5 MG tablet TAKE 1 TABLET BY MOUTH FOUR TIMES DAILY Patient taking differently: Take 5 mg by mouth in the morning, at noon, in the evening, and at bedtime.  12/25/19  Yes Denita Lung, MD  LORazepam (ATIVAN) 1 MG tablet Take 1 mg by mouth daily as needed for sleep.  04/19/20   [provider]    Physical Exam: Vitals:   04/29/20 2130 04/29/20 2145 04/29/20 2228 04/29/20 2300  BP: 140/71 135/77 139/71 (!) 144/72  Pulse: 85 85 78 85  Resp: (!) 21 19 17 15   Temp:      TempSrc:      SpO2: 99% 100% 99% 99%  Weight:      Height:          Constitutional: Anxious, no acute distress Vitals:   04/29/20 2130 04/29/20 2145 04/29/20 2228 04/29/20 2300  BP: 140/71 135/77 139/71 (!) 144/72  Pulse: 85 85 78 85  Resp: (!) 21 19 17 15   Temp:      TempSrc:      SpO2: 99% 100% 99% 99%  Weight:      Height:       Eyes: PERRL, lids and conjunctivae normal ENMT: Mucous membranes are moist. Posterior pharynx clear of any exudate or lesions.Normal dentition.  Neck: normal, supple, no masses, no thyromegaly Respiratory: clear to auscultation bilaterally, no wheezing, no crackles. Normal respiratory effort. No accessory muscle use.  Cardiovascular: Regular rate and rhythm, no murmurs / rubs / gallops. No extremity edema. 2+ pedal pulses. No carotid bruits.  Abdomen: no tenderness, no masses palpated. No hepatosplenomegaly. Bowel sounds positive.  Musculoskeletal: no clubbing / cyanosis. No joint deformity upper and lower extremities. Good ROM, no contractures. Normal muscle tone.  Skin: no rashes, lesions, ulcers. No induration Neurologic: CN 2-12 grossly intact.  DTR normal. Strength 5/5 in all 4.  Mild sensory deficits Psychiatric: Normal judgment and insight.  Alert and oriented x 3.  Anxious mood.     Labs on Admission: I have personally reviewed following labs and imaging studies  CBC: Recent Labs  Lab 04/29/20 2002 04/29/20 2017 04/29/20 2243  WBC 8.1  --  7.6  NEUTROABS 4.7  --   --   HGB 13.7 13.9 12.5  HCT 41.1 41.0 37.6  MCV 90.9  --  90.2  PLT 291  --  981   Basic Metabolic Panel: Recent Labs  Lab 04/29/20 2002 04/29/20 2017 04/29/20 2243  NA 139 139  --   K 3.9 3.7  --  CL 101 99  --   CO2 26  --   --   GLUCOSE 102* 98  --   BUN 18 21*  --   CREATININE 1.00 0.90 0.87  CALCIUM 9.7  --   --    GFR: Estimated Creatinine Clearance: 55.9 mL/min (by C-G formula based on SCr of 0.87 mg/dL). Liver Function Tests: Recent Labs  Lab 04/29/20 2002  AST 30  ALT 29  ALKPHOS 61  BILITOT 0.7  PROT 6.9  ALBUMIN 4.0   No results for input(s): LIPASE, AMYLASE in the last 168 hours. No results for input(s): AMMONIA in the last 168 hours. Coagulation Profile: Recent Labs  Lab 04/29/20 2002  INR 1.0   Cardiac Enzymes: No results for input(s): CKTOTAL, CKMB, CKMBINDEX, TROPONINI in the last 168 hours. BNP (last 3 results) No results for input(s): PROBNP in the last 8760 hours. HbA1C: No results for input(s): HGBA1C in the last 72 hours. CBG: Recent Labs  Lab 04/29/20 1944  GLUCAP 104*   Lipid Profile: No results for input(s): CHOL, HDL, LDLCALC, TRIG, CHOLHDL, LDLDIRECT in the last 72 hours. Thyroid Function Tests: No results for input(s): TSH, T4TOTAL, FREET4, T3FREE, THYROIDAB in the last 72 hours. Anemia Panel: No results for input(s): VITAMINB12, FOLATE, FERRITIN, TIBC, IRON, RETICCTPCT in the last 72 hours. Urine analysis:    Component Value Date/Time   COLORURINE YELLOW 04/29/2020 2103   APPEARANCEUR CLOUDY (A) 04/29/2020 2103   LABSPEC 1.044 (H) 04/29/2020 2103   PHURINE 6.0 04/29/2020 2103   GLUCOSEU NEGATIVE 04/29/2020 2103   HGBUR SMALL (A) 04/29/2020 2103   BILIRUBINUR NEGATIVE 04/29/2020 2103    BILIRUBINUR neg 03/23/2018 1431   KETONESUR 5 (A) 04/29/2020 2103   PROTEINUR NEGATIVE 04/29/2020 2103   UROBILINOGEN 0.2 03/23/2018 1431   UROBILINOGEN 0.2 07/16/2014 1547   NITRITE NEGATIVE 04/29/2020 2103   LEUKOCYTESUR LARGE (A) 04/29/2020 2103   Sepsis Labs: @LABRCNTIP (procalcitonin:4,lacticidven:4) ) Recent Results (from the past 240 hour(s))  SARS Coronavirus 2 by RT PCR (hospital order, performed in [place] hospital lab) Nasopharyngeal Nasopharyngeal Swab     Status: None   Collection Time: 04/29/20  9:44 PM   Specimen: Nasopharyngeal Swab  Result Value Ref Range Status   SARS Coronavirus 2 NEGATIVE NEGATIVE Final    Comment: (NOTE) SARS-CoV-2 target nucleic acids are NOT DETECTED.  The SARS-CoV-2 RNA is generally detectable in upper and lower respiratory specimens during the acute phase of infection. The lowest concentration of SARS-CoV-2 viral copies this assay can detect is 250 copies / mL. A negative result does not preclude SARS-CoV-2 infection and should not be used as the sole basis for treatment or other patient management decisions.  A negative result may occur with improper specimen collection / handling, submission of specimen other than nasopharyngeal swab, presence of viral mutation(s) within the areas targeted by this assay, and inadequate number of viral copies (<250 copies / mL). A negative result must be combined with clinical observations, patient history, and epidemiological information.  Fact Sheet for Patients:   StrictlyIdeas.no  Fact Sheet for Healthcare Providers: BankingDealers.co.za  This test is not yet approved or  cleared by the [place] FDA and has been authorized for detection and/or diagnosis of SARS-CoV-2 by FDA under an Emergency Use Authorization (EUA).  This EUA will remain in effect (meaning this test can be used) for the duration of the COVID-19 declaration under Section  564(b)(1) of the Act, 21 U.S.C. section 360bbb-3(b)(1), unless the authorization is terminated or revoked sooner.  Performed at [facility], [place] [address], [place], [place] [address]      Radiological Exams on Admission: CT Angio Neck W and/or Wo Contrast  Addendum Date: 04/29/2020   ADDENDUM REPORT: 04/29/2020 21:58 ADDENDUM: These results were called by telephone at the time of interpretation on 04/29/2020 at 8:58 pm to provider [facility] , who verbally acknowledged these results. Electronically Signed   By: Kellie Simmering DO   On: 04/29/2020 21:58   Result Date: 04/29/2020 CLINICAL DATA:  Stroke/TIA, assess intracranial arteries. Right-sided weakness. EXAM: CT ANGIOGRAPHY HEAD AND NECK TECHNIQUE: Multidetector CT imaging of the head and neck was performed using the standard protocol during bolus administration of intravenous contrast. Multiplanar CT image reconstructions and MIPs were obtained to evaluate the vascular anatomy. Carotid stenosis measurements (when applicable) are obtained utilizing NASCET criteria, using the distal internal carotid diameter as the denominator. CONTRAST:  26mL OMNIPAQUE IOHEXOL 350 MG/ML SOLN COMPARISON:  Noncontrast head CT performed earlier the same day 04/29/2020. FINDINGS: CTA NECK FINDINGS Aortic arch: Standard aortic branching. The visualized aortic arch is unremarkable. No hemodynamically significant innominate or proximal subclavian artery stenosis. Right carotid system: CCA and ICA patent within the neck without stenosis. No significant atherosclerotic disease. Left carotid system: CCA and ICA patent within the neck without stenosis. No significant atherosclerotic disease. Vertebral arteries: Streak artifact from disc replacements at the C5-C6 and C6-C7 levels somewhat limit evaluation of the cervical vertebral arteries. Within this limitation, the vertebral arteries are patent within the neck bilaterally without significant stenosis. The left  vertebral artery is significantly dominant. Skeleton: No acute bony abnormality or aggressive osseous lesion. Disc replacements are present at the C5-C6 and C6-C7 levels. Superimposed cervical spondylosis. No appreciable high-grade bony spinal canal narrowing. Other neck: No neck mass or cervical lymphadenopathy. The left thyroid lobe is poorly delineated and may be atrophic or surgically absent Upper chest: No consolidation within the imaged lung apices Review of the MIP images confirms the above findings CTA HEAD FINDINGS Anterior circulation: The intracranial internal carotid arteries are patent. The M1 middle cerebral arteries are patent without significant stenosis. There is atherosclerotic irregularity of the M2 and more distal MCA branch vessels bilaterally. However, no M2 proximal branch occlusion or high-grade proximal stenosis is identified. The anterior cerebral arteries are patent. Tiny 1 mm inferiorly projecting vascular protrusion arising from the paraclinoid left ICA which may reflect an infundibulum or tiny aneurysm (series 10, image 100). Posterior circulation: The dominant intracranial left vertebral artery is patent without significant stenosis. The non dominant intracranial right vertebral artery is markedly developmentally diminutive beyond the origin of the right PICA with the distal right V4 vertebral artery poorly delineated. A high-grade stenosis of the distal right vertebral artery cannot be excluded. The basilar artery is patent. Atherosclerotic irregularity of both posterior cerebral arteries. Most notably, there is a high-grade focal stenosis within the left posterior cerebral artery at the P2/P3 junction (series 11, image 12). Posterior communicating arteries are hypoplastic or absent bilaterally. Venous sinuses: Within limitations of contrast timing, no convincing thrombus. Anatomic variants: As described Review of the MIP images confirms the above findings IMPRESSION: CTA neck: 1. The  bilateral common and internal carotid arteries are patent within the neck without stenosis. 2. Streak artifact from disc replacements at the C5-C6 and C6-C7 levels limits evaluation of the cervical vertebral arteries. Within this limitation, the vertebral artery are patent within the neck without stenosis. CTA head: 1. The non-dominant intracranial right vertebral artery is markedly developmentally  diminutive beyond the origin of the right PICA. The very distal V4 right vertebral artery is poorly delineated and a superimposed high-grade stenosis at this site cannot be excluded. 2. High-grade stenosis within the left posterior cerebral artery at the P2/P3 junction. Background mild atherosclerotic irregularity of both posterior cerebral arteries. 3. Mild atherosclerotic irregularity of the M2 and more distal MCA branches bilaterally. No M2 proximal branch occlusion or high-grade proximal stenosis is demonstrated. Electronically Signed: By: Kellie Simmering DO On: 04/29/2020 20:54   CT HEAD CODE STROKE WO CONTRAST  Addendum Date: 04/29/2020   ADDENDUM REPORT: 04/29/2020 20:33 ADDENDUM: These results were called by telephone at the time of interpretation on 04/29/2020 at 8:32 pm to provider Dr. Milas Gain , who verbally acknowledged these results. Electronically Signed   By: Ulyses Jarred M.D.   On: 04/29/2020 20:33   Result Date: 04/29/2020 CLINICAL DATA:  Code stroke.  Right-sided weakness and numbness EXAM: CT HEAD WITHOUT CONTRAST TECHNIQUE: Contiguous axial images were obtained from the base of the skull through the vertex without intravenous contrast. COMPARISON:  None. FINDINGS: Brain: There is no mass, hemorrhage or extra-axial collection. The size and configuration of the ventricles and extra-axial CSF spaces are normal. The brain parenchyma is normal, without evidence of acute or chronic infarction. Vascular: No abnormal hyperdensity of the major intracranial arteries or dural venous sinuses. No intracranial  atherosclerosis. Skull: The visualized skull base, calvarium and extracranial soft tissues are normal. Sinuses/Orbits: No fluid levels or advanced mucosal thickening of the visualized paranasal sinuses. No mastoid or middle ear effusion. The orbits are normal. ASPECTS [facility] Stroke Program Early CT Score) - Ganglionic level infarction (caudate, lentiform nuclei, internal capsule, insula, M1-M3 cortex): 7 - Supraganglionic infarction (M4-M6 cortex): 3 Total score (0-10 with 10 being normal): 10 IMPRESSION: 1. Normal head CT. 2. ASPECTS is 10. Dr. Milas Gain paged at [place] PM. Electronically Signed: By: Ulyses Jarred M.D. On: 04/29/2020 20:14   CT ANGIO HEAD CODE STROKE  Addendum Date: 04/29/2020   ADDENDUM REPORT: 04/29/2020 21:58 ADDENDUM: These results were called by telephone at the time of interpretation on 04/29/2020 at 8:58 pm to provider [facility] , who verbally acknowledged these results. Electronically Signed   By: Kellie Simmering DO   On: 04/29/2020 21:58   Result Date: 04/29/2020 CLINICAL DATA:  Stroke/TIA, assess intracranial arteries. Right-sided weakness. EXAM: CT ANGIOGRAPHY HEAD AND NECK TECHNIQUE: Multidetector CT imaging of the head and neck was performed using the standard protocol during bolus administration of intravenous contrast. Multiplanar CT image reconstructions and MIPs were obtained to evaluate the vascular anatomy. Carotid stenosis measurements (when applicable) are obtained utilizing NASCET criteria, using the distal internal carotid diameter as the denominator. CONTRAST:  19mL OMNIPAQUE IOHEXOL 350 MG/ML SOLN COMPARISON:  Noncontrast head CT performed earlier the same day 04/29/2020. FINDINGS: CTA NECK FINDINGS Aortic arch: Standard aortic branching. The visualized aortic arch is unremarkable. No hemodynamically significant innominate or proximal subclavian artery stenosis. Right carotid system: CCA and ICA patent within the neck without stenosis. No significant atherosclerotic  disease. Left carotid system: CCA and ICA patent within the neck without stenosis. No significant atherosclerotic disease. Vertebral arteries: Streak artifact from disc replacements at the C5-C6 and C6-C7 levels somewhat limit evaluation of the cervical vertebral arteries. Within this limitation, the vertebral arteries are patent within the neck bilaterally without significant stenosis. The left vertebral artery is significantly dominant. Skeleton: No acute bony abnormality or aggressive osseous lesion. Disc replacements are present at the C5-C6 and C6-C7 levels.  Superimposed cervical spondylosis. No appreciable high-grade bony spinal canal narrowing. Other neck: No neck mass or cervical lymphadenopathy. The left thyroid lobe is poorly delineated and may be atrophic or surgically absent Upper chest: No consolidation within the imaged lung apices Review of the MIP images confirms the above findings CTA HEAD FINDINGS Anterior circulation: The intracranial internal carotid arteries are patent. The M1 middle cerebral arteries are patent without significant stenosis. There is atherosclerotic irregularity of the M2 and more distal MCA branch vessels bilaterally. However, no M2 proximal branch occlusion or high-grade proximal stenosis is identified. The anterior cerebral arteries are patent. Tiny 1 mm inferiorly projecting vascular protrusion arising from the paraclinoid left ICA which may reflect an infundibulum or tiny aneurysm (series 10, image 100). Posterior circulation: The dominant intracranial left vertebral artery is patent without significant stenosis. The non dominant intracranial right vertebral artery is markedly developmentally diminutive beyond the origin of the right PICA with the distal right V4 vertebral artery poorly delineated. A high-grade stenosis of the distal right vertebral artery cannot be excluded. The basilar artery is patent. Atherosclerotic irregularity of both posterior cerebral arteries.  Most notably, there is a high-grade focal stenosis within the left posterior cerebral artery at the P2/P3 junction (series 11, image 12). Posterior communicating arteries are hypoplastic or absent bilaterally. Venous sinuses: Within limitations of contrast timing, no convincing thrombus. Anatomic variants: As described Review of the MIP images confirms the above findings IMPRESSION: CTA neck: 1. The bilateral common and internal carotid arteries are patent within the neck without stenosis. 2. Streak artifact from disc replacements at the C5-C6 and C6-C7 levels limits evaluation of the cervical vertebral arteries. Within this limitation, the vertebral artery are patent within the neck without stenosis. CTA head: 1. The non-dominant intracranial right vertebral artery is markedly developmentally diminutive beyond the origin of the right PICA. The very distal V4 right vertebral artery is poorly delineated and a superimposed high-grade stenosis at this site cannot be excluded. 2. High-grade stenosis within the left posterior cerebral artery at the P2/P3 junction. Background mild atherosclerotic irregularity of both posterior cerebral arteries. 3. Mild atherosclerotic irregularity of the M2 and more distal MCA branches bilaterally. No M2 proximal branch occlusion or high-grade proximal stenosis is demonstrated. Electronically Signed: By: Kellie Simmering DO On: 04/29/2020 20:54    EKG: Independently reviewed.  Shows sinus rhythm with borderline left axis deviation.  No significant ST changes  Assessment/Plan Principal Problem:   TIA (transient ischemic attack) Active Problems:   ADD (attention deficit disorder)   Sjogren's syndrome (HCC)   Hypothyroidism   Essential hypertension     #1 TIA versus CVA: Await MRI of the brain to rule out CVA.  Patient will be admitted for observation.  Carotid Dopplers, echocardiogram.  Initiate aspirin and statin high-dose intensity.  Follow-up by neurology.  Continue further  treatment.  Patient has had previous cervical surgery and appears to have had some radiculopathy.  MRI of the C-spine without contrast recommended.  This could be coming from the cervical spine rather than a CVA.  #2 essential hypertension: Blood pressure control using home regimen.  Permissive hypertension if stroke is confirmed.  #3 hypothyroidism: Continue levothyroxine  #4 Sjogren's syndrome: Stable.  Continue home regimen.  #5 anxiety disorder with ADD: Confirm on resume home regimen  #6 asymptomatic bacteriuria: No evidence of ongoing UTI symptomatically.  Hold off on antibiotics for now.   DVT prophylaxis: Lovenox Code Status: Full code Family Communication: No family at bedside Disposition Plan: Home  Consults called: Neurologist Dr. Lorrin Goodell Admission status: Observation  Severity of Illness: The appropriate patient status for this patient is OBSERVATION. Observation status is judged to be reasonable and necessary in order to provide the required intensity of service to ensure the patient's safety. The patient's presenting symptoms, physical exam findings, and initial radiographic and laboratory data in the context of their medical condition is felt to place them at decreased risk for further clinical deterioration. Furthermore, it is anticipated that the patient will be medically stable for discharge from the hospital within 2 midnights of admission. The following factors support the patient status of observation.   " The patient's presenting symptoms include right-sided numbness. " The physical exam findings include no focal weakness. " The initial radiographic and laboratory data are abnormal CT angiogram.     Catharina Pica,LAWAL MD [facility] Pager [phone]  If 7PM-7AM, please contact night-coverage [email or web address] Password [facility]  04/29/2020, 11:44 PM

## 2020-04-29 NOTE — ED Triage Notes (Signed)
Pt arrived POV, pt c/o R sided arm and leg weakness/numbness onset 1900. Pt states she did not become confused or lose ability to speak clearly.

## 2020-04-30 ENCOUNTER — Observation Stay (HOSPITAL_BASED_OUTPATIENT_CLINIC_OR_DEPARTMENT_OTHER): Payer: 59

## 2020-04-30 ENCOUNTER — Other Ambulatory Visit: Payer: Self-pay

## 2020-04-30 ENCOUNTER — Observation Stay (HOSPITAL_COMMUNITY): Payer: 59

## 2020-04-30 DIAGNOSIS — E039 Hypothyroidism, unspecified: Secondary | ICD-10-CM | POA: Diagnosis not present

## 2020-04-30 DIAGNOSIS — M47812 Spondylosis without myelopathy or radiculopathy, cervical region: Secondary | ICD-10-CM | POA: Diagnosis not present

## 2020-04-30 DIAGNOSIS — M4802 Spinal stenosis, cervical region: Secondary | ICD-10-CM | POA: Diagnosis not present

## 2020-04-30 DIAGNOSIS — R200 Anesthesia of skin: Secondary | ICD-10-CM | POA: Diagnosis not present

## 2020-04-30 DIAGNOSIS — R531 Weakness: Secondary | ICD-10-CM | POA: Diagnosis not present

## 2020-04-30 DIAGNOSIS — F909 Attention-deficit hyperactivity disorder, unspecified type: Secondary | ICD-10-CM | POA: Diagnosis not present

## 2020-04-30 DIAGNOSIS — G459 Transient cerebral ischemic attack, unspecified: Secondary | ICD-10-CM

## 2020-04-30 DIAGNOSIS — I6389 Other cerebral infarction: Secondary | ICD-10-CM

## 2020-04-30 DIAGNOSIS — M3500 Sicca syndrome, unspecified: Secondary | ICD-10-CM

## 2020-04-30 DIAGNOSIS — Z8543 Personal history of malignant neoplasm of ovary: Secondary | ICD-10-CM | POA: Diagnosis not present

## 2020-04-30 DIAGNOSIS — I10 Essential (primary) hypertension: Secondary | ICD-10-CM | POA: Diagnosis not present

## 2020-04-30 DIAGNOSIS — M4312 Spondylolisthesis, cervical region: Secondary | ICD-10-CM | POA: Diagnosis not present

## 2020-04-30 DIAGNOSIS — Z79899 Other long term (current) drug therapy: Secondary | ICD-10-CM | POA: Diagnosis not present

## 2020-04-30 DIAGNOSIS — Z20822 Contact with and (suspected) exposure to covid-19: Secondary | ICD-10-CM | POA: Diagnosis not present

## 2020-04-30 LAB — COMPREHENSIVE METABOLIC PANEL
ALT: 25 U/L (ref 0–44)
AST: 25 U/L (ref 15–41)
Albumin: 3.4 g/dL — ABNORMAL LOW (ref 3.5–5.0)
Alkaline Phosphatase: 53 U/L (ref 38–126)
Anion gap: 9 (ref 5–15)
BUN: 13 mg/dL (ref 6–20)
CO2: 25 mmol/L (ref 22–32)
Calcium: 9.2 mg/dL (ref 8.9–10.3)
Chloride: 103 mmol/L (ref 98–111)
Creatinine, Ser: 0.81 mg/dL (ref 0.44–1.00)
GFR calc Af Amer: >60 mL/min (ref >60)
GFR calc non Af Amer: >60 mL/min (ref >60)
Glucose, Bld: 87 mg/dL (ref 70–99)
Potassium: 3.6 mmol/L (ref 3.5–5.1)
Sodium: 137 mmol/L (ref 135–145)
Total Bilirubin: 0.5 mg/dL (ref 0.3–1.2)
Total Protein: 6 g/dL — ABNORMAL LOW (ref 6.5–8.1)

## 2020-04-30 LAB — CBC
HCT: 37.8 % (ref 36.0–46.0)
Hemoglobin: 12.7 g/dL (ref 12.0–15.0)
MCH: 30.5 pg (ref 26.0–34.0)
MCHC: 33.6 g/dL (ref 30.0–36.0)
MCV: 90.6 fL (ref 80.0–100.0)
Platelets: 234 10*3/uL (ref 150–400)
RBC: 4.17 MIL/uL (ref 3.87–5.11)
RDW: 12.1 % (ref 11.5–15.5)
WBC: 6.6 10*3/uL (ref 4.0–10.5)
nRBC: 0 % (ref 0.0–0.2)

## 2020-04-30 LAB — HIV ANTIBODY (ROUTINE TESTING W REFLEX): HIV Screen 4th Generation wRfx: NONREACTIVE

## 2020-04-30 LAB — ECHOCARDIOGRAM COMPLETE
Area-P 1/2: 3.91 cm2
Height: 60 in
S' Lateral: 2.4 cm
Weight: 2081.14 oz

## 2020-04-30 LAB — LIPID PANEL
Cholesterol: 207 mg/dL — ABNORMAL HIGH (ref 0–200)
HDL: 52 mg/dL (ref >40)
LDL Cholesterol: 110 mg/dL — ABNORMAL HIGH (ref 0–99)
Total CHOL/HDL Ratio: 4 RATIO
Triglycerides: 224 mg/dL — ABNORMAL HIGH (ref <150)
VLDL: 45 mg/dL — ABNORMAL HIGH (ref 0–40)

## 2020-04-30 LAB — HEMOGLOBIN A1C
Hgb A1c MFr Bld: 5.6 % (ref 4.8–5.6)
Mean Plasma Glucose: 114.02 mg/dL

## 2020-04-30 IMAGING — MR MR CERVICAL SPINE W/O CM
5 series · 37 of 48 positions shown · non-contrast
Comparison: None.

CLINICAL DATA: Right-sided weakness

EXAM:
MRI CERVICAL SPINE WITHOUT CONTRAST
TECHNIQUE: Multiplanar, multisequence MR imaging of the cervical spine was
performed. No intravenous contrast was administered.

[Series 5: T2 · sagittal · 3.0mm · 0.69mm/px · 6 of 15 slices shown (1 of 2)]
[im 1/15]
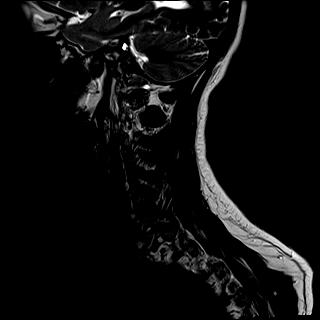
[im 3/15]
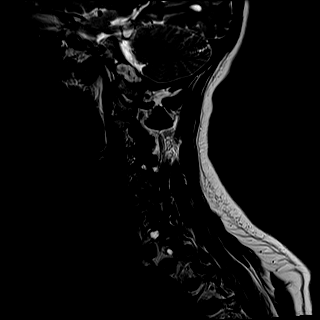
[im 6/15]
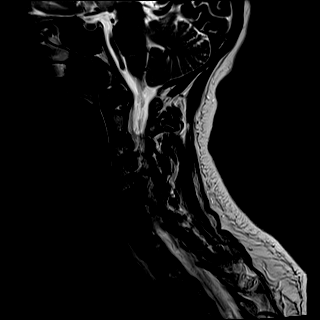
[im 9/15]
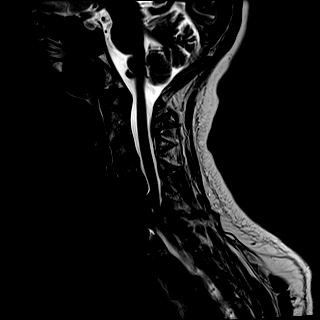
[im 12/15]
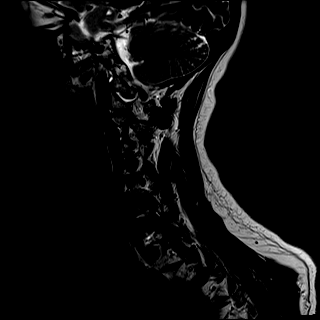
[im 15/15]
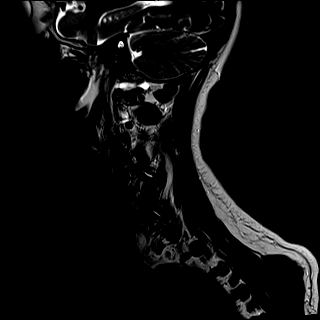

[Series 6: T1 · sagittal · 3.0mm · 0.69mm/px · 6 of 15 slices shown]
[im 1/15]
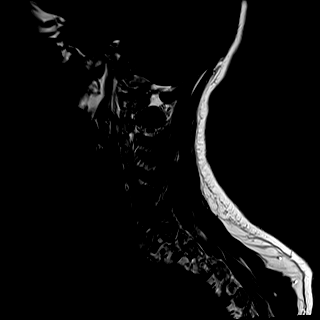
[im 3/15]
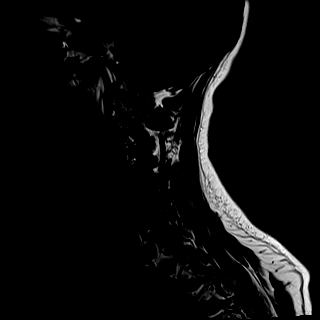
[im 6/15]
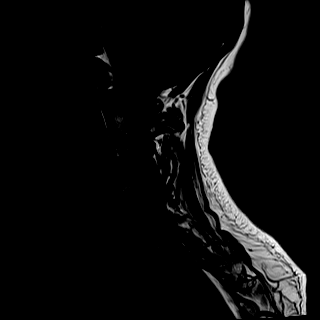
[im 9/15]
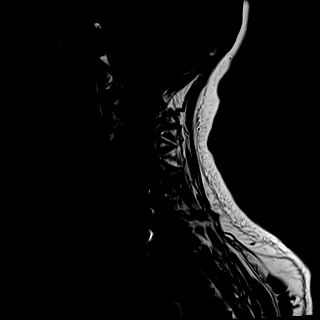
[im 12/15]
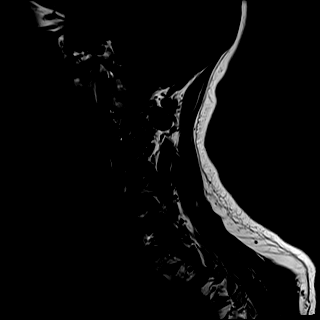
[im 15/15]
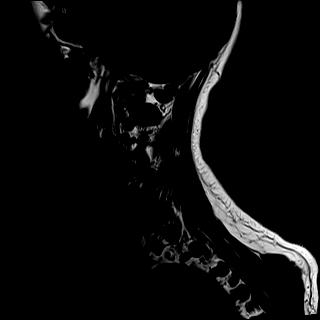

[Series 7: STIR · sagittal · 3.0mm · 0.86mm/px · 6 of 15 slices shown]
[im 1/15]
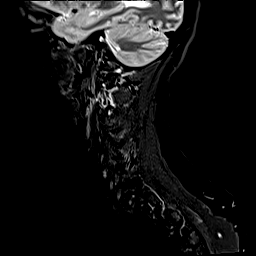
[im 3/15]
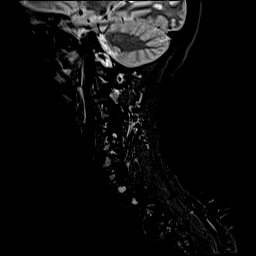
[im 6/15]
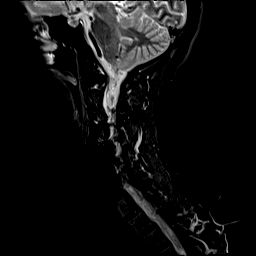
[im 9/15]
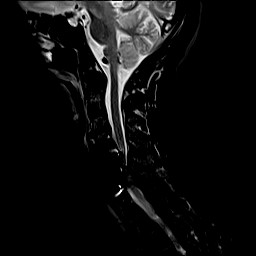
[im 12/15]
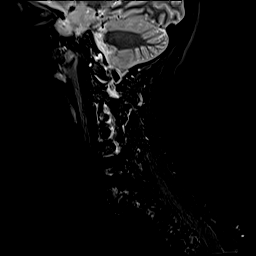
[im 15/15]
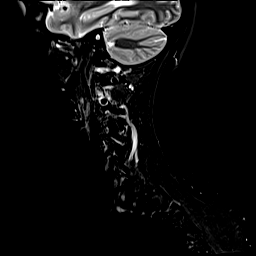

[Series 8: T2 · axial · 3.0mm · 0.66mm/px · z∈[-109,+16]mm · 11 of 40 slices shown (2 of 2)]
[im 1/40]
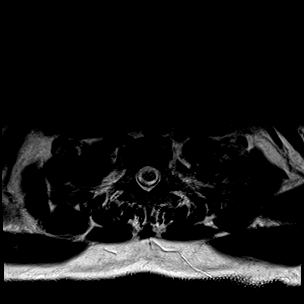
[im 3/40]
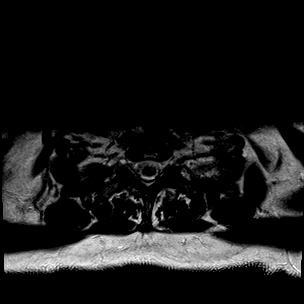
[im 6/40]
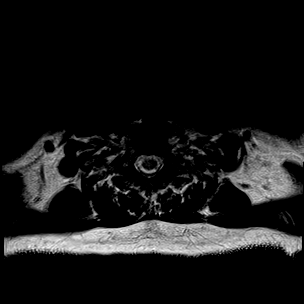
[im 9/40]
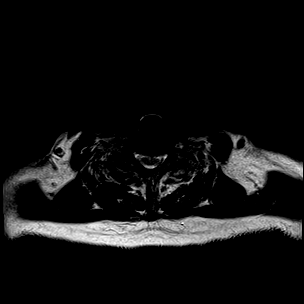
[im 12/40]
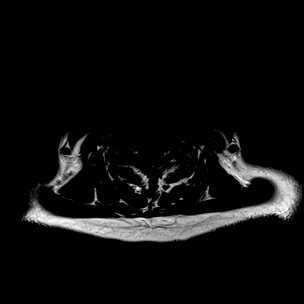
[im 17/40]
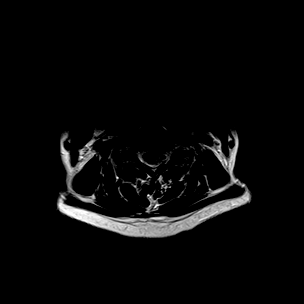
[im 20/40]
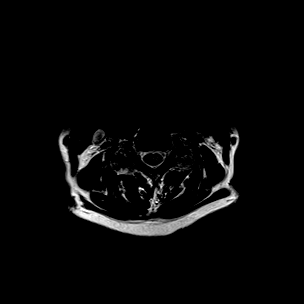
[im 23/40]
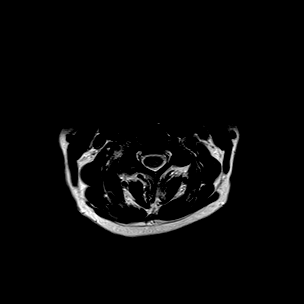
[im 28/40]
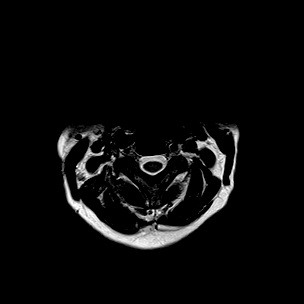
[im 34/40]
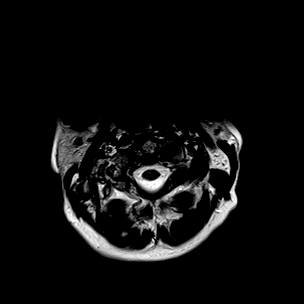
[im 40/40]
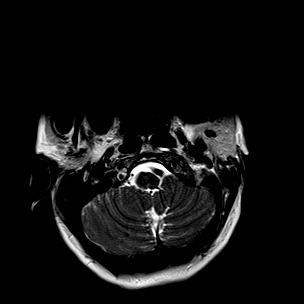

[Series 9: GRE · axial · 3.0mm · 0.39mm/px · z∈[-109,+16]mm · 8 of 40 slices shown]
[im 1/40]
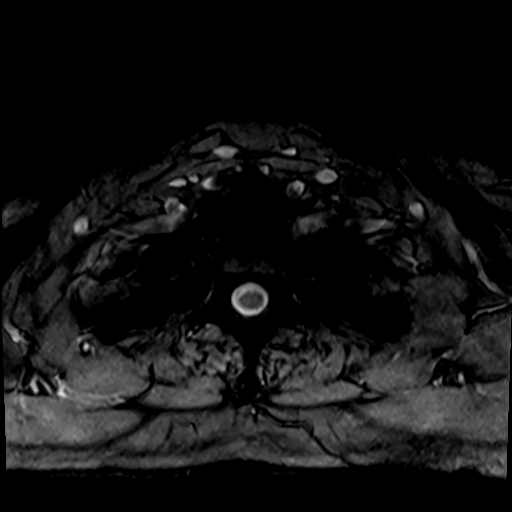
[im 6/40]
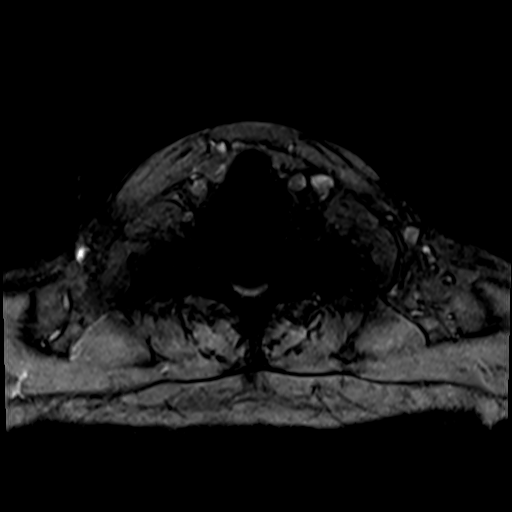
[im 12/40]
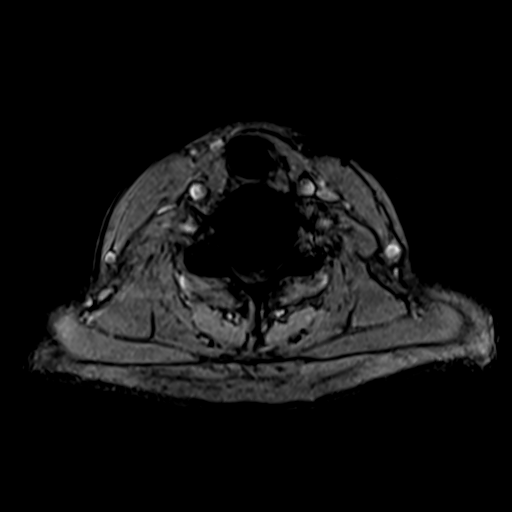
[im 17/40]
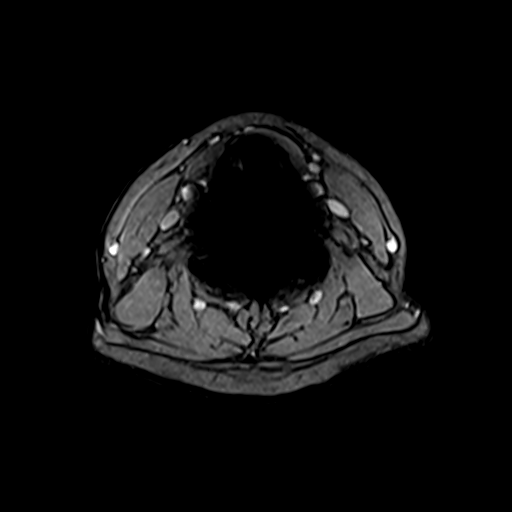
[im 23/40]
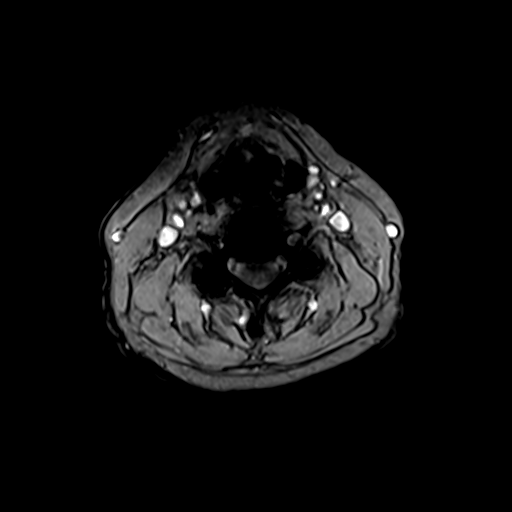
[im 28/40]
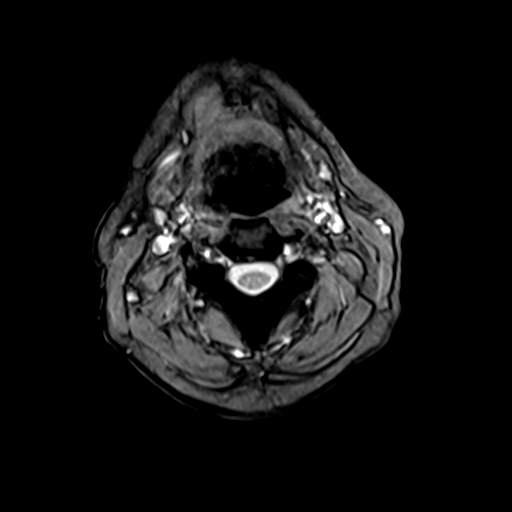
[im 34/40]
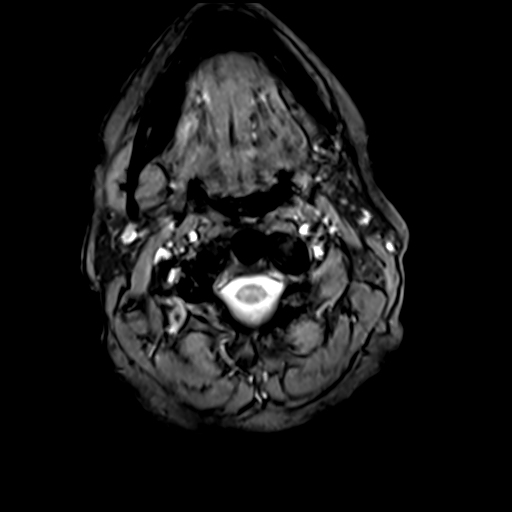
[im 40/40]
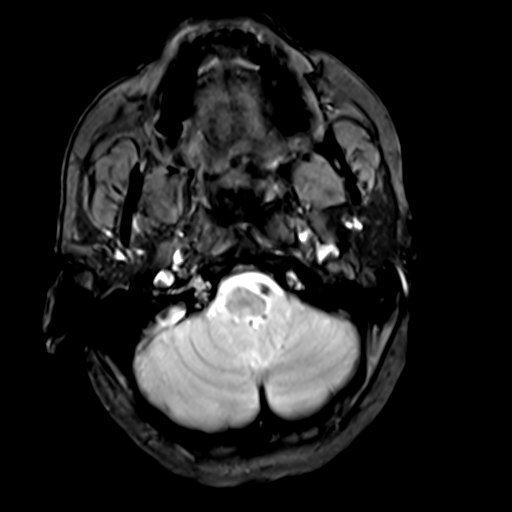

[37 of 48 positions shown; findings below may reference images not displayed]

FINDINGS: Alignment: Unchanged grade 1 retrolisthesis at C5-6

Vertebrae: Intervertebral spacer devices at C5-6 and C6-7.

Cord: Normal signal and morphology.

Posterior Fossa, vertebral arteries, paraspinal tissues: Negative.

Disc levels:

C1-2: Unremarkable.

C2-3: Normal disc space and facet joints. There is no spinal canal
stenosis. No neural foraminal stenosis.

C3-4: Bilateral uncovertebral hypertrophy. There is no spinal canal
stenosis. Mild bilateral neural foraminal stenosis.

C4-5: Left-greater-than-right facet hypertrophy. Mild endplate
spurring. There is no spinal canal stenosis. Mild bilateral neural
foraminal stenosis.

C5-6: Postsurgical changes. Susceptibility effects obscure the
anterior spinal canal but there is no visible spinal canal stenosis.
Neural foramina are patent.

C6-7: Postsurgical changes. Left-greater-than-right facet
hypertrophy. Susceptibility effects obscure the anterior spinal
canal. There is no visible spinal canal stenosis. Neural foramina
are patent.

C7-T1: Normal disc space and facet joints. There is no spinal canal
stenosis. No neural foraminal stenosis.
IMPRESSION: 1. Postsurgical changes at C5-6 and C6-7 with patent spinal canal
and neural foramina at these levels.
2. Mild bilateral neural foraminal stenosis at C3-4 and C4-5.

## 2020-04-30 MED ORDER — LEVOTHYROXINE SODIUM 88 MCG PO TABS: 88.0000 ug | ORAL_TABLET | Freq: Every day | ORAL | Status: DC

## 2020-04-30 MED ORDER — ASPIRIN 81 MG PO TBEC: 81.0000 mg | DELAYED_RELEASE_TABLET | Freq: Every day | ORAL | 0 refills | Status: AC

## 2020-04-30 MED ORDER — EST ESTROGENS-METHYLTEST 1.25-2.5 MG PO TABS: 1.0000 | ORAL_TABLET | Freq: Every day | ORAL | Status: DC

## 2020-04-30 MED ORDER — CLOPIDOGREL BISULFATE 75 MG PO TABS: 75.0000 mg | ORAL_TABLET | Freq: Every day | ORAL | 0 refills | Status: DC

## 2020-04-30 MED ORDER — BUPROPION HCL ER (XL) 150 MG PO TB24: 300.0000 mg | ORAL_TABLET | Freq: Every day | ORAL | Status: DC

## 2020-04-30 MED ORDER — PILOCARPINE HCL 5 MG PO TABS
5.0000 mg | ORAL_TABLET | Freq: Four times a day (QID) | ORAL | Status: DC
  Administered 2020-04-30: 5 mg via ORAL
  Filled 2020-04-30 (×2): qty 1

## 2020-04-30 MED ORDER — ATORVASTATIN CALCIUM 40 MG PO TABS: 40.0000 mg | ORAL_TABLET | Freq: Every day | ORAL | 0 refills | Status: DC

## 2020-04-30 MED ORDER — GUANFACINE HCL 1 MG PO TABS
2.0000 mg | ORAL_TABLET | Freq: Every day | ORAL | Status: DC
  Filled 2020-04-30: qty 2

## 2020-04-30 MED ORDER — LORAZEPAM 1 MG PO TABS: 1.0000 mg | ORAL_TABLET | Freq: Every day | ORAL | Status: DC | PRN

## 2020-04-30 MED ORDER — ASPIRIN EC 81 MG PO TBEC: 81.0000 mg | DELAYED_RELEASE_TABLET | Freq: Every day | ORAL | Status: DC

## 2020-04-30 MED ORDER — HYDROCHLOROTHIAZIDE 25 MG PO TABS: 25.0000 mg | ORAL_TABLET | Freq: Every day | ORAL | Status: DC

## 2020-04-30 MED ORDER — MIRABEGRON ER 25 MG PO TB24: 50.0000 mg | ORAL_TABLET | Freq: Every day | ORAL | Status: DC

## 2020-04-30 MED ORDER — CLOPIDOGREL BISULFATE 75 MG PO TABS
75.0000 mg | ORAL_TABLET | Freq: Every day | ORAL | Status: DC
  Administered 2020-04-30: 75 mg via ORAL
  Filled 2020-04-30: qty 1

## 2020-04-30 MED ORDER — ESTRADIOL 1 MG PO TABS
1.0000 mg | ORAL_TABLET | Freq: Every day | ORAL | Status: DC
  Administered 2020-04-30: 1 mg via ORAL
  Filled 2020-04-30: qty 1

## 2020-04-30 MED ORDER — METHYLPHENIDATE HCL 5 MG PO TABS: 20.0000 mg | ORAL_TABLET | Freq: Three times a day (TID) | ORAL | Status: DC

## 2020-04-30 MED FILL — ATORVASTATIN CALCIUM 40 MG: 40 | 30 days supply | Qty: 30 | Fill #0

## 2020-04-30 MED FILL — CLOPIDOGREL 75 MG TABLET: 75 | 21 days supply | Qty: 21 | Fill #0

## 2020-04-30 NOTE — ED Notes (Signed)
Ordered breakfast 

## 2020-04-30 NOTE — Evaluation (Signed)
Occupational Therapy Evaluation Patient Details Name: Dorothy Wood MRN: [id number] DOB: Jun 17, 1961 Today's Date: 04/30/2020    History of Present Illness Pt is a 59 y/o female with PMH of HTN, sjogren's syndrome, ovarian cancer s/p total abdominal hysterectomy and on hormorne replacement therapy who present with sudden onset of R facial and UE nunmbness. CT, MRI negative for aucte abnormablity.   Clinical Impression   PTA patient independent and working. Admitted for above and presenting at baseline level of independence for basic ADLs, transfers, mobility.  She continues to report decreased sensation in R hand, but otherwise strength, coordination, proximal sensation and deep pressure sensation WFL.  No deficits seen in cognition, vision or balance. Encouraged patient to utilize her R hand as much as possible, as sensation appears to be improving.  No further OT needs identified.  OT will sign off.      Follow Up Recommendations  No OT follow up    Equipment Recommendations  None recommended by OT    Recommendations for Other Services       Precautions / Restrictions Restrictions Weight Bearing Restrictions: No      Mobility Bed Mobility Overal bed mobility: Independent                Transfers Overall transfer level: Independent                    Balance Overall balance assessment: Independent                                         ADL either performed or assessed with clinical judgement   ADL Overall ADL's : Independent                                       General ADL Comments: no assist required for LB dressing, grooming, and transfers      Vision Baseline Vision/History: No visual deficits Patient Visual Report: No change from baseline Vision Assessment?: No apparent visual deficits     Perception     Praxis      Pertinent Vitals/Pain Pain Assessment: No/denies pain     Hand Dominance Right    Extremity/Trunk Assessment Upper Extremity Assessment Upper Extremity Assessment: RUE deficits/detail RUE Deficits / Details: continues to reports numbness in hand, otherwise WFL  RUE Sensation: decreased light touch RUE Coordination: WNL   Lower Extremity Assessment Lower Extremity Assessment: Defer to PT evaluation   Cervical / Trunk Assessment Cervical / Trunk Assessment: Normal   Communication Communication Communication: No difficulties   Cognition Arousal/Alertness: Awake/alert Behavior During Therapy: WFL for tasks assessed/performed Overall Cognitive Status: Within Functional Limits for tasks assessed                                     General Comments  hallway mobility with independence, completing dual cog task with independence    Exercises     Shoulder Instructions      Home Living Family/patient expects to be discharged to:: Private residence Living Arrangements: Spouse/significant other Available Help at Discharge: Family Type of Home: House Home Access: Level entry     Home Layout: One level     Bathroom Shower/Tub: Occupational psychologist:  Handicapped height     Home Equipment: Shower seat - built in;Grab bars - tub/shower          Prior Functioning/Environment Level of Independence: Independent        Comments: 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, mental health Problem List: Impaired sensation      OT Treatment/Interventions:      OT Goals(Current goals can be found in the care plan section) Acute Rehab OT Goals Patient Stated Goal: get back home  OT Goal Formulation: With patient  OT Frequency:     Barriers to D/C:            Co-evaluation              AM-PAC OT "6 Clicks" Daily Activity     Outcome Measure Help from another person eating meals?: None Help from another person taking care of personal grooming?: None Help from another person toileting, which includes using toliet, bedpan, or urinal?: None Help  from another person bathing (including washing, rinsing, drying)?: None Help from another person to put on and taking off regular upper body clothing?: None Help from another person to put on and taking off regular lower body clothing?: None 6 Click Score: 24   End of Session Equipment Utilized During Treatment: Gait belt Nurse Communication: Mobility status  Activity Tolerance: Patient tolerated treatment well Patient left: in bed;with call bell/phone within reach;with bed alarm set  OT Visit Diagnosis: Other symptoms and signs involving the nervous system (R29.898)                Time: 6967-8938 OT Time Calculation (min): 13 min Charges:  OT General Charges $OT Visit: 1 Visit OT Evaluation $OT Eval Low Complexity: 1 Low  Jolaine Artist, OT Acute Rehabilitation Services Pager [phone] Office [phone]   Delight Stare 04/30/2020, 10:31 AM

## 2020-04-30 NOTE — Progress Notes (Signed)
  Echocardiogram 2D Echocardiogram has been performed.  Jennette Dubin 04/30/2020, 10:06 AM

## 2020-04-30 NOTE — Evaluation (Signed)
Speech Language Pathology Evaluation Patient Details Name: Dorothy Wood MRN: [id number] DOB: [birth year] Today's Date: 04/30/2020 Time: 6195-0932 SLP Time Calculation (min) (ACUTE ONLY): 12 min  Problem List:  Patient Active Problem List   Diagnosis Date Noted  . TIA (transient ischemic attack) 04/29/2020  . Essential hypertension 04/29/2020  . Hormone replacement therapy (HRT) 09/09/2016  . Hypothyroidism 07/16/2014  . Sjogren's syndrome ([place]) 04/11/2013  . History of ovarian cancer 04/11/2013  . Personal history of colonic polyps 04/11/2013  . ADD (attention deficit disorder) 07/06/2012  . Dysthymia 07/06/2012   Past Medical History:  Past Medical History:  Diagnosis Date  . Cancer (HCC)    OVARIAN  . Colonic polyp   . Hyperlipidemia   . Hypertension   . Sjogren's disease ([place])   . Thyroid disease    HYPOTHYROIDISM   Past Surgical History:  Past Surgical History:  Procedure Laterality Date  . ABDOMINAL HYSTERECTOMY     Ovarian cancer  . BREAST CYST ASPIRATION    . CESAREAN SECTION     x2  . COLONOSCOPY  10-06   Dr. Earnest Bailey  . TONSILLECTOMY     HPI:  Pt is a 59 y/o female with PMH of HTN, sjogren's syndrome, ovarian cancer s/p total abdominal hysterectomy and on hormorne replacement therapy who present with sudden onset of R facial and UE nunmbness. CT, MRI negative for aucte abnormablity.   Assessment / Plan / Recommendation Clinical Impression  Pt participated in speech/language/cognition evaluation evaluation. 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 [facility] Mental Status Examination was completed to evaluate the pt's cognitive-linguistic skills. She achieved a score of 28/30 which is within the normal limits of 27 or more out of 30. No speech or language deficits were noted and her performance on informal cognitive-linguistic tasks was  also within normal limits. Further skilled SLP services are not clinically indicated at this time. Pt was educated regarding this and she verbalized understanding as well as agreement with plan of care.    SLP Assessment  SLP Recommendation/Assessment: Patient does not need any further Speech Lanaguage Pathology Services SLP Visit Diagnosis: Cognitive communication deficit (R41.841)    Follow Up Recommendations  None    Frequency and Duration           SLP Evaluation Cognition  Overall Cognitive Status: Within Functional Limits for tasks assessed Arousal/Alertness: Awake/alert Orientation Level: Oriented X4 Attention: Focused;Sustained Focused Attention: Appears intact Sustained Attention: Appears intact Memory: Appears intact Awareness: Appears intact Problem Solving: Appears intact Executive Function: Reasoning;Sequencing;Organizing Reasoning: Appears intact Sequencing: Appears intact Organizing: Appears intact       Comprehension  Auditory Comprehension Overall Auditory Comprehension: Appears within functional limits for tasks assessed Yes/No Questions: Within Functional Limits Commands: Within Functional Limits Conversation: Complex Visual Recognition/Discrimination Discrimination: Within Function Limits Reading Comprehension Reading Status: Within funtional limits    Expression Expression Primary Mode of Expression: Verbal Verbal Expression Overall Verbal Expression: Appears within functional limits for tasks assessed Initiation: No impairment Level of Generative/Spontaneous Verbalization: Conversation Repetition: No impairment Naming: No impairment Pragmatics: No impairment Written Expression Dominant Hand: Right   Oral / Motor  Oral Motor/Sensory Function Overall Oral Motor/Sensory Function: Within functional limits Motor Speech Overall Motor Speech: Appears within functional limits for tasks assessed Respiration: Within functional limits Phonation:  Normal Resonance: Within functional limits Articulation: Within functional limitis Intelligibility: Intelligible Motor Planning: Witnin functional limits  Dylyn Mclaren I. Hardin Negus, [place], [place] Office number [phone] Pager [place] 04/30/2020, 11:53 AM

## 2020-04-30 NOTE — Progress Notes (Signed)
STROKE TEAM PROGRESS NOTE   INTERVAL HISTORY I have personally reviewed history of presenting illness with the patient, electronic medical records and imaging films in PACS.  She reports she is under significant stress due to recent diagnosis of recurrent cancer in her husband.  She reports sudden onset of dizziness followed by numbness in the left leg and shortly after in the left arm as well.  This involved the whole face during splitting forehead.  No weakness.  No headache, double vision no slurred speech.  MRI scan of the brain negative for acute stroke MRI of the brain shows no significant intracranial large vessel stenosis.  MRI of the neck is also negative for large vessel stenosis.  LDL cholesterol is 110 mg percent and hemoglobin A1c is 5.6.  Echocardiogram is pending  Vitals:   04/30/20 0700 04/30/20 0753 04/30/20 0800 04/30/20 1049  BP:  113/62  118/71  Pulse:  74  64  Resp: 12 18 12 18   Temp:  98.3 F (36.8 C)  97.7 F (36.5 C)  TempSrc:  Oral  Oral  SpO2:  99%  100%  Weight:      Height:       CBC:  Recent Labs  Lab 04/29/20 2002 04/29/20 2017 04/29/20 2243 04/30/20 0410  WBC 8.1   < > 7.6 6.6  NEUTROABS 4.7  --   --   --   HGB 13.7   < > 12.5 12.7  HCT 41.1   < > 37.6 37.8  MCV 90.9   < > 90.2 90.6  PLT 291   < > 239 234   < > = values in this interval not displayed.   Basic Metabolic Panel:  Recent Labs  Lab 04/29/20 2002 04/29/20 2002 04/29/20 2017 04/29/20 2017 04/29/20 2243 04/30/20 0410  NA 139   < > 139  --   --  137  K 3.9   < > 3.7  --   --  3.6  CL 101   < > 99  --   --  103  CO2 26  --   --   --   --  25  GLUCOSE 102*   < > 98  --   --  87  BUN 18   < > 21*  --   --  13  CREATININE 1.00   < > 0.90   < > 0.87 0.81  CALCIUM 9.7  --   --   --   --  9.2   < > = values in this interval not displayed.   Lipid Panel:  Recent Labs  Lab 04/30/20 0410  CHOL 207*  TRIG 224*  HDL 52  CHOLHDL 4.0  VLDL 45*  LDLCALC 110*   HgbA1c:  Recent  Labs  Lab 04/30/20 0410  HGBA1C 5.6   Urine Drug Screen:  Recent Labs  Lab 04/29/20 2103  LABOPIA NONE DETECTED  COCAINSCRNUR NONE DETECTED  LABBENZ POSITIVE*  AMPHETMU NONE DETECTED  THCU NONE DETECTED  LABBARB NONE DETECTED    Alcohol Level  Recent Labs  Lab 04/29/20 2002  ETH <10    IMAGING past 24 hours CT Angio Neck W and/or Wo Contrast  Addendum Date: 04/29/2020   ADDENDUM REPORT: 04/29/2020 21:58 ADDENDUM: These results were called by telephone at the time of interpretation on 04/29/2020 at 8:58 pm to provider [facility] , who verbally acknowledged these results. Electronically Signed   By: Kellie Simmering DO   On: 04/29/2020 21:58  Result Date: 04/29/2020 CLINICAL DATA:  Stroke/TIA, assess intracranial arteries. Right-sided weakness. EXAM: CT ANGIOGRAPHY HEAD AND NECK TECHNIQUE: Multidetector CT imaging of the head and neck was performed using the standard protocol during bolus administration of intravenous contrast. Multiplanar CT image reconstructions and MIPs were obtained to evaluate the vascular anatomy. Carotid stenosis measurements (when applicable) are obtained utilizing NASCET criteria, using the distal internal carotid diameter as the denominator. CONTRAST:  89mL OMNIPAQUE IOHEXOL 350 MG/ML SOLN COMPARISON:  Noncontrast head CT performed earlier the same day 04/29/2020. FINDINGS: CTA NECK FINDINGS Aortic arch: Standard aortic branching. The visualized aortic arch is unremarkable. No hemodynamically significant innominate or proximal subclavian artery stenosis. Right carotid system: CCA and ICA patent within the neck without stenosis. No significant atherosclerotic disease. Left carotid system: CCA and ICA patent within the neck without stenosis. No significant atherosclerotic disease. Vertebral arteries: Streak artifact from disc replacements at the C5-C6 and C6-C7 levels somewhat limit evaluation of the cervical vertebral arteries. Within this limitation, the  vertebral arteries are patent within the neck bilaterally without significant stenosis. The left vertebral artery is significantly dominant. Skeleton: No acute bony abnormality or aggressive osseous lesion. Disc replacements are present at the C5-C6 and C6-C7 levels. Superimposed cervical spondylosis. No appreciable high-grade bony spinal canal narrowing. Other neck: No neck mass or cervical lymphadenopathy. The left thyroid lobe is poorly delineated and may be atrophic or surgically absent Upper chest: No consolidation within the imaged lung apices Review of the MIP images confirms the above findings CTA HEAD FINDINGS Anterior circulation: The intracranial internal carotid arteries are patent. The M1 middle cerebral arteries are patent without significant stenosis. There is atherosclerotic irregularity of the M2 and more distal MCA branch vessels bilaterally. However, no M2 proximal branch occlusion or high-grade proximal stenosis is identified. The anterior cerebral arteries are patent. Tiny 1 mm inferiorly projecting vascular protrusion arising from the paraclinoid left ICA which may reflect an infundibulum or tiny aneurysm (series 10, image 100). Posterior circulation: The dominant intracranial left vertebral artery is patent without significant stenosis. The non dominant intracranial right vertebral artery is markedly developmentally diminutive beyond the origin of the right PICA with the distal right V4 vertebral artery poorly delineated. A high-grade stenosis of the distal right vertebral artery cannot be excluded. The basilar artery is patent. Atherosclerotic irregularity of both posterior cerebral arteries. Most notably, there is a high-grade focal stenosis within the left posterior cerebral artery at the P2/P3 junction (series 11, image 12). Posterior communicating arteries are hypoplastic or absent bilaterally. Venous sinuses: Within limitations of contrast timing, no convincing thrombus. Anatomic  variants: As described Review of the MIP images confirms the above findings IMPRESSION: CTA neck: 1. The bilateral common and internal carotid arteries are patent within the neck without stenosis. 2. Streak artifact from disc replacements at the C5-C6 and C6-C7 levels limits evaluation of the cervical vertebral arteries. Within this limitation, the vertebral artery are patent within the neck without stenosis. CTA head: 1. The non-dominant intracranial right vertebral artery is markedly developmentally diminutive beyond the origin of the right PICA. The very distal V4 right vertebral artery is poorly delineated and a superimposed high-grade stenosis at this site cannot be excluded. 2. High-grade stenosis within the left posterior cerebral artery at the P2/P3 junction. Background mild atherosclerotic irregularity of both posterior cerebral arteries. 3. Mild atherosclerotic irregularity of the M2 and more distal MCA branches bilaterally. No M2 proximal branch occlusion or high-grade proximal stenosis is demonstrated. Electronically Signed: By: Kellie Simmering DO  On: 04/29/2020 20:54   MR ANGIO HEAD WO CONTRAST  Result Date: 04/30/2020 CLINICAL DATA:  Right-sided weakness and numbness. EXAM: MRI HEAD WITHOUT CONTRAST MRA HEAD WITHOUT CONTRAST TECHNIQUE: Multiplanar, multiecho pulse sequences of the brain and surrounding structures were obtained without intravenous contrast. Angiographic images of the head were obtained using MRA technique without contrast. COMPARISON:  None. FINDINGS: MRI HEAD FINDINGS BRAIN: No acute infarct, acute hemorrhage or extra-axial collection. Normal white matter signal. Normal volume of CSF spaces. No chronic microhemorrhage. Normal midline structures. VASCULAR: Major flow voids are preserved. SKULL AND UPPER CERVICAL SPINE: Normal calvarium and skull base. Visualized upper cervical spine and soft tissues are normal. SINUSES/ORBITS: No paranasal sinus fluid levels or advanced mucosal  thickening. No mastoid or middle ear effusion. Normal orbits. MRA HEAD FINDINGS POSTERIOR CIRCULATION: --Vertebral arteries: Normal V4 segments. --Inferior cerebellar arteries: Normal. --Basilar artery: Normal. --Superior cerebellar arteries: Normal. --Posterior cerebral arteries: Normal. ANTERIOR CIRCULATION: --Intracranial internal carotid arteries: Normal. --Anterior cerebral arteries (ACA): Normal. Both A1 segments are present. Patent anterior communicating artery (a-comm). --Middle cerebral arteries (MCA): Normal. IMPRESSION: Normal MRI/MRA of the brain. Electronically Signed   By: Ulyses Jarred M.D.   On: 04/30/2020 01:19   MR BRAIN WO CONTRAST  Result Date: 04/30/2020 CLINICAL DATA:  Right-sided weakness and numbness. EXAM: MRI HEAD WITHOUT CONTRAST MRA HEAD WITHOUT CONTRAST TECHNIQUE: Multiplanar, multiecho pulse sequences of the brain and surrounding structures were obtained without intravenous contrast. Angiographic images of the head were obtained using MRA technique without contrast. COMPARISON:  None. FINDINGS: MRI HEAD FINDINGS BRAIN: No acute infarct, acute hemorrhage or extra-axial collection. Normal white matter signal. Normal volume of CSF spaces. No chronic microhemorrhage. Normal midline structures. VASCULAR: Major flow voids are preserved. SKULL AND UPPER CERVICAL SPINE: Normal calvarium and skull base. Visualized upper cervical spine and soft tissues are normal. SINUSES/ORBITS: No paranasal sinus fluid levels or advanced mucosal thickening. No mastoid or middle ear effusion. Normal orbits. MRA HEAD FINDINGS POSTERIOR CIRCULATION: --Vertebral arteries: Normal V4 segments. --Inferior cerebellar arteries: Normal. --Basilar artery: Normal. --Superior cerebellar arteries: Normal. --Posterior cerebral arteries: Normal. ANTERIOR CIRCULATION: --Intracranial internal carotid arteries: Normal. --Anterior cerebral arteries (ACA): Normal. Both A1 segments are present. Patent anterior communicating  artery (a-comm). --Middle cerebral arteries (MCA): Normal. IMPRESSION: Normal MRI/MRA of the brain. Electronically Signed   By: Ulyses Jarred M.D.   On: 04/30/2020 01:19   MR Cervical Spine Wo Contrast  Result Date: 04/30/2020 CLINICAL DATA:  Right-sided weakness EXAM: MRI CERVICAL SPINE WITHOUT CONTRAST TECHNIQUE: Multiplanar, multisequence MR imaging of the cervical spine was performed. No intravenous contrast was administered. COMPARISON:  None. FINDINGS: Alignment: Unchanged grade 1 retrolisthesis at C5-6 Vertebrae: Intervertebral spacer devices at C5-6 and C6-7. Cord: Normal signal and morphology. Posterior Fossa, vertebral arteries, paraspinal tissues: Negative. Disc levels: C1-2: Unremarkable. C2-3: Normal disc space and facet joints. There is no spinal canal stenosis. No neural foraminal stenosis. C3-4: Bilateral uncovertebral hypertrophy. There is no spinal canal stenosis. Mild bilateral neural foraminal stenosis. C4-5: Left-greater-than-right facet hypertrophy. Mild endplate spurring. There is no spinal canal stenosis. Mild bilateral neural foraminal stenosis. C5-6: Postsurgical changes. Susceptibility effects obscure the anterior spinal canal but there is no visible spinal canal stenosis. Neural foramina are patent. C6-7: Postsurgical changes. Left-greater-than-right facet hypertrophy. Susceptibility effects obscure the anterior spinal canal. There is no visible spinal canal stenosis. Neural foramina are patent. C7-T1: Normal disc space and facet joints. There is no spinal canal stenosis. No neural foraminal stenosis. IMPRESSION: 1. Postsurgical changes at C5-6 and C6-7  with patent spinal canal and neural foramina at these levels. 2. Mild bilateral neural foraminal stenosis at C3-4 and C4-5. Electronically Signed   By: Ulyses Jarred M.D.   On: 04/30/2020 01:12   CT HEAD CODE STROKE WO CONTRAST  Addendum Date: 04/29/2020   ADDENDUM REPORT: 04/29/2020 20:33 ADDENDUM: These results were called by  telephone at the time of interpretation on 04/29/2020 at 8:32 pm to provider Dr. Milas Gain , who verbally acknowledged these results. Electronically Signed   By: Ulyses Jarred M.D.   On: 04/29/2020 20:33   Result Date: 04/29/2020 CLINICAL DATA:  Code stroke.  Right-sided weakness and numbness EXAM: CT HEAD WITHOUT CONTRAST TECHNIQUE: Contiguous axial images were obtained from the base of the skull through the vertex without intravenous contrast. COMPARISON:  None. FINDINGS: Brain: There is no mass, hemorrhage or extra-axial collection. The size and configuration of the ventricles and extra-axial CSF spaces are normal. The brain parenchyma is normal, without evidence of acute or chronic infarction. Vascular: No abnormal hyperdensity of the major intracranial arteries or dural venous sinuses. No intracranial atherosclerosis. Skull: The visualized skull base, calvarium and extracranial soft tissues are normal. Sinuses/Orbits: No fluid levels or advanced mucosal thickening of the visualized paranasal sinuses. No mastoid or middle ear effusion. The orbits are normal. ASPECTS [facility] Stroke Program Early CT Score) - Ganglionic level infarction (caudate, lentiform nuclei, internal capsule, insula, M1-M3 cortex): 7 - Supraganglionic infarction (M4-M6 cortex): 3 Total score (0-10 with 10 being normal): 10 IMPRESSION: 1. Normal head CT. 2. ASPECTS is 10. Dr. Milas Gain paged at Section PM. Electronically Signed: By: Ulyses Jarred M.D. On: 04/29/2020 20:14   CT ANGIO HEAD CODE STROKE  Addendum Date: 04/29/2020   ADDENDUM REPORT: 04/29/2020 21:58 ADDENDUM: These results were called by telephone at the time of interpretation on 04/29/2020 at 8:58 pm to provider [facility] , who verbally acknowledged these results. Electronically Signed   By: Kellie Simmering DO   On: 04/29/2020 21:58   Result Date: 04/29/2020 CLINICAL DATA:  Stroke/TIA, assess intracranial arteries. Right-sided weakness. EXAM: CT ANGIOGRAPHY HEAD AND NECK TECHNIQUE:  Multidetector CT imaging of the head and neck was performed using the standard protocol during bolus administration of intravenous contrast. Multiplanar CT image reconstructions and MIPs were obtained to evaluate the vascular anatomy. Carotid stenosis measurements (when applicable) are obtained utilizing NASCET criteria, using the distal internal carotid diameter as the denominator. CONTRAST:  42mL OMNIPAQUE IOHEXOL 350 MG/ML SOLN COMPARISON:  Noncontrast head CT performed earlier the same day 04/29/2020. FINDINGS: CTA NECK FINDINGS Aortic arch: Standard aortic branching. The visualized aortic arch is unremarkable. No hemodynamically significant innominate or proximal subclavian artery stenosis. Right carotid system: CCA and ICA patent within the neck without stenosis. No significant atherosclerotic disease. Left carotid system: CCA and ICA patent within the neck without stenosis. No significant atherosclerotic disease. Vertebral arteries: Streak artifact from disc replacements at the C5-C6 and C6-C7 levels somewhat limit evaluation of the cervical vertebral arteries. Within this limitation, the vertebral arteries are patent within the neck bilaterally without significant stenosis. The left vertebral artery is significantly dominant. Skeleton: No acute bony abnormality or aggressive osseous lesion. Disc replacements are present at the C5-C6 and C6-C7 levels. Superimposed cervical spondylosis. No appreciable high-grade bony spinal canal narrowing. Other neck: No neck mass or cervical lymphadenopathy. The left thyroid lobe is poorly delineated and may be atrophic or surgically absent Upper chest: No consolidation within the imaged lung apices Review of the MIP images confirms the above findings CTA  HEAD FINDINGS Anterior circulation: The intracranial internal carotid arteries are patent. The M1 middle cerebral arteries are patent without significant stenosis. There is atherosclerotic irregularity of the M2 and more  distal MCA branch vessels bilaterally. However, no M2 proximal branch occlusion or high-grade proximal stenosis is identified. The anterior cerebral arteries are patent. Tiny 1 mm inferiorly projecting vascular protrusion arising from the paraclinoid left ICA which may reflect an infundibulum or tiny aneurysm (series 10, image 100). Posterior circulation: The dominant intracranial left vertebral artery is patent without significant stenosis. The non dominant intracranial right vertebral artery is markedly developmentally diminutive beyond the origin of the right PICA with the distal right V4 vertebral artery poorly delineated. A high-grade stenosis of the distal right vertebral artery cannot be excluded. The basilar artery is patent. Atherosclerotic irregularity of both posterior cerebral arteries. Most notably, there is a high-grade focal stenosis within the left posterior cerebral artery at the P2/P3 junction (series 11, image 12). Posterior communicating arteries are hypoplastic or absent bilaterally. Venous sinuses: Within limitations of contrast timing, no convincing thrombus. Anatomic variants: As described Review of the MIP images confirms the above findings IMPRESSION: CTA neck: 1. The bilateral common and internal carotid arteries are patent within the neck without stenosis. 2. Streak artifact from disc replacements at the C5-C6 and C6-C7 levels limits evaluation of the cervical vertebral arteries. Within this limitation, the vertebral artery are patent within the neck without stenosis. CTA head: 1. The non-dominant intracranial right vertebral artery is markedly developmentally diminutive beyond the origin of the right PICA. The very distal V4 right vertebral artery is poorly delineated and a superimposed high-grade stenosis at this site cannot be excluded. 2. High-grade stenosis within the left posterior cerebral artery at the P2/P3 junction. Background mild atherosclerotic irregularity of both posterior  cerebral arteries. 3. Mild atherosclerotic irregularity of the M2 and more distal MCA branches bilaterally. No M2 proximal branch occlusion or high-grade proximal stenosis is demonstrated. Electronically Signed: By: Kellie Simmering DO On: 04/29/2020 20:54    PHYSICAL EXAM Pleasant young anxious middle-aged lady not in distress . Afebrile. Head is nontraumatic. Neck is supple without bruit.    Cardiac exam no murmur or gallop. Lungs are clear to auscultation. Distal pulses are well felt. Neurological Exam ;  Awake  Alert oriented x 3. Normal speech and language.eye movements full without nystagmus.fundi were not visualized. Vision acuity and fields appear normal. Hearing is normal. Palatal movements are normal. Face symmetric. Tongue midline. Normal strength, tone, reflexes and coordination. Normal sensation. Gait deferred.  ASSESSMENT/PLAN [name] is a 60 y.o. female with history of HTN, HLD, Sjogren's disease, hypothyroidism presenting with left-sided numbness involving the left face, left arm and left leg.    R brain TIA versus anxiety  Code Stroke CT head No acute abnormality.  ASPECTS 10.     CTA head R V4 diminuative. L PCA high-grade P2/P3 jxn. B PCA atherosclerosis. Mild atherosclerosis B M2  CTA neck Unremarkable, streak artifact from recent OR cervical   MRI  / MRA  Unremarkable   2D Echo EF 60-65%. No source of embolus   LDL 110  HgbA1c 5.6  VTE prophylaxis - Lovenox 40 mg sq daily   No antithrombotic prior to admission, now on aspirin 325 mg daily. Decrease aspirin to 81 and add plavix 75 x 3 weeks then aspirin alone.   Therapy recommendations:  No therapy needs  Disposition:  Return home [place] for d/c from stroke standpoint Follow-up Stroke Clinic at  [name] in 4 weeks. Office will call with appointment date and time. Order placed.  Hypertension  Stable . BP goal normotensive  Hyperlipidemia  Home meds:  No statin  Now on  lipitor 40  LDL 110, goal < 70  Continue statin at discharge  Other Stroke Risk Factors  ETOH use, alcohol level <10, advised to drink no more than 1 drink(s) a day  Other Active Problems  Sjogren's disease  Hypothyroidism   Hospital day # 0 She presented with transient fleeting left-sided numbness with dizziness lasting an hour which has improved.  Brain imaging and neurovascular work-up is unyielding.  Check echocardiogram.  Recommend aspirin Plavix for 3 weeks followed by aspirin alone statin for elevated lipids and aggressive risk factor modification.  Greater than 50% time during this 35-minute visit was spent in counseling and coordination of care about TIA anxiety and answering questions.  Discussed with Dr. Jacqulyn Cane, MD To contact Stroke Continuity provider, please refer to [email or web address] After hours, contact General Neurology

## 2020-04-30 NOTE — Plan of Care (Signed)
Independent with adl's

## 2020-04-30 NOTE — Progress Notes (Signed)
PT Cancellation Note  Patient Details Name: Dorothy Wood MRN: [id number] DOB: 1961/08/20   Cancelled Treatment:    Reason Eval/Treat Not Completed: PT screened, no needs identified, will sign off  Discussed with patient after reviewing OT evaluation and she agrees her mobility is at baseline and no PT needs identified.   Arby Barrette, PT Pager [phone]  Rexanne Mano 04/30/2020, 2:28 PM

## 2020-04-30 NOTE — TOC Transition Note (Signed)
Transition of Care [facility]) - CM/SW Discharge Note   Patient Details  Name: Dorothy Wood MRN: [id number] Date of Birth: [birth year]  [facility] CM/SW Contact:  Pollie Friar, RN Phone Number: 04/30/2020, 4:07 PM   Clinical Narrative:    Pt discharging home with self care. No f/u per PT/OT and no DME needs.  Pt has transportation home.   Final next level of care: Home/Self Care Barriers to Discharge: No Barriers Identified   Patient Goals and CMS Choice        Discharge Placement                       Discharge Plan and Services                                     Social Determinants of Health (SDOH) Interventions     Readmission Risk Interventions No flowsheet data found.

## 2020-04-30 NOTE — ED Notes (Signed)
Attempted report to inpatient floor. Call back number left with secretary.

## 2020-04-30 NOTE — Discharge Summary (Signed)
Physician Discharge Summary  Dorothy Wood MIW:[id number] DOB: 02/11/1961 DOA: 04/29/2020  PCP: Denita Lung, MD  Admit date: 04/29/2020 Discharge date: 04/30/2020  Admitted From: Home  Disposition:  Home   Recommendations for Outpatient Follow-up and new medication changes:  1. Follow up with Dr. Redmond School in 7 days.  2. Continue aspirin and clopidogrel for 3 weeks and then continue with aspirin alone  3. Patient has been started on statin therapy.   Home Health: no   Equipment/Devices: no    Discharge Condition: stable  CODE STATUS: full  Diet recommendation: heart healthy    Brief/Interim Summary: Patient was admitted with the working diagnosis of transitory ischemic attack, right sided weakness and paresthesias.  59 year old female with significant past medical history for hypertension, Sjogren's syndrome, dyslipidemia, hypothyroidism and ovarian cancer.  She is status post hysterectomy and currently on chronic hormone replacement therapy.   Patient reported a sudden onset of right side facial numbness, associated right upper and lower extremities weakness, for about 60 minutes prior to coming to the hospital.  On her initial physical examination her temperature was 98.6, blood pressure 157/75, heart rate 92, respiratory rate 26, oxygen saturation 99%.  Her lungs were clear to auscultation bilaterally, heart S1-S2, present and rhythmic, her abdomen was soft, no lower extremity edema.  Her strength was preserved, only mild sensory deficits noted.  She was anxious. Sodium 139, potassium 3.9, chloride 101, bicarb 26, glucose 102, BUN 18, creatinine 1.0, AST 30, ALT 29, white count 8.1, hemoglobin 13.7, hematocrit 41.1, platelets 291. SARS COVID-19 negative.  Urinalysis specific gravity 1.044, 21-50 epithelial cells, 11-20 white cells, 11-20 red cells.  Toxicology positive for benzodiazepines. EKG 79 bpm, left axis deviation, left anterior fascicular block, right bundle branch block,  sinus rhythm, no ST segment or T wave changes. Brain MRI/MRA normal. CT angiography with high-grade stenosis within the left posterior cerebral artery, mild atherosclerotic irregularity distal MCA.   1.  Transitory ischemic attack.  Patient's neurologic deficit is improving, currently only mild paresthesias but no weakness.  Her speech is not impaired. Her liver profile showed cholesterol 207, HDL 52, LDL 110, triglycerides 124 and VLDL of 45. Her echocardiography showed a preserved LV systolic function of 60 to 65% with no apparent source of thrombosis. Evaluated by physical, occupational and speech therapy, with no further follow-up recommended.  Patient has been placed on aspirin and clopidogrel for dual antiplatelet therapy.  Statin therapy with 40 mg of atorvastatin.  2.  Hypertension/dyslipidemia.  Continue blood pressure control with hydrochlorothiazide.  Patient has been started on atorvastatin.  3.  Hypothyroidism.  Continue levothyroxine.  4.  Attention deficit, anxiety.  Continue bupropion, guanfacine, methylphenidate and as needed lorazepam.  5.  Sjogren's disease.  Continue pilocarpine.  Discharge Diagnoses:  Principal Problem:   TIA (transient ischemic attack) Active Problems:   ADD (attention deficit disorder)   Sjogren's syndrome ([place])   Hypothyroidism   Essential hypertension    Discharge Instructions   Allergies as of 04/30/2020   No Known Allergies     Medication List    TAKE these medications   aspirin 81 MG EC tablet Take 1 tablet (81 mg total) by mouth daily. Swallow whole. Start taking on: May 01, 2020   atorvastatin 40 MG tablet Commonly known as: LIPITOR Take 1 tablet (40 mg total) by mouth daily. Start taking on: May 01, 2020   buPROPion 300 MG 24 hr tablet Commonly known as: WELLBUTRIN XL Take 1 tablet (300 mg total) by  mouth daily.   clopidogrel 75 MG tablet Commonly known as: PLAVIX Take 1 tablet (75 mg total) by mouth daily.    estradiol 1 MG tablet Commonly known as: ESTRACE Take 1 mg by mouth daily.   estrogens-methylTEST 1.25-2.5 MG Tabs tablet Take 1 tablet by mouth daily.   guanFACINE 2 MG tablet Commonly known as: TENEX Take 2 mg by mouth in the morning and at bedtime.   hydrochlorothiazide 25 MG tablet Commonly known as: HYDRODIURIL TAKE 1 TABLET BY MOUTH ONCE DAILY   levothyroxine 88 MCG tablet Commonly known as: SYNTHROID Take 88 mcg by mouth daily.   LORazepam 1 MG tablet Commonly known as: ATIVAN Take 1 mg by mouth daily as needed for sleep.   methylphenidate 20 MG tablet Commonly known as: Ritalin Take 1 tablet (20 mg total) by mouth 3 (three) times daily.   Myrbetriq 50 MG Tb24 tablet Generic drug: mirabegron ER Take 50 mg by mouth daily.   pilocarpine 5 MG tablet Commonly known as: SALAGEN TAKE 1 TABLET BY MOUTH FOUR TIMES DAILY What changed: when to take this       No Known Allergies  Consultations:  Neurology    Procedures/Studies: CT Angio Neck W and/or Wo Contrast  Addendum Date: 04/29/2020   ADDENDUM REPORT: 04/29/2020 21:58 ADDENDUM: These results were called by telephone at the time of interpretation on 04/29/2020 at 8:58 pm to provider [facility] , who verbally acknowledged these results. Electronically Signed   By: Kellie Simmering DO   On: 04/29/2020 21:58   Result Date: 04/29/2020 CLINICAL DATA:  Stroke/TIA, assess intracranial arteries. Right-sided weakness. EXAM: CT ANGIOGRAPHY HEAD AND NECK TECHNIQUE: Multidetector CT imaging of the head and neck was performed using the standard protocol during bolus administration of intravenous contrast. Multiplanar CT image reconstructions and MIPs were obtained to evaluate the vascular anatomy. Carotid stenosis measurements (when applicable) are obtained utilizing NASCET criteria, using the distal internal carotid diameter as the denominator. CONTRAST:  27mL OMNIPAQUE IOHEXOL 350 MG/ML SOLN COMPARISON:  Noncontrast head  CT performed earlier the same day 04/29/2020. FINDINGS: CTA NECK FINDINGS Aortic arch: Standard aortic branching. The visualized aortic arch is unremarkable. No hemodynamically significant innominate or proximal subclavian artery stenosis. Right carotid system: CCA and ICA patent within the neck without stenosis. No significant atherosclerotic disease. Left carotid system: CCA and ICA patent within the neck without stenosis. No significant atherosclerotic disease. Vertebral arteries: Streak artifact from disc replacements at the C5-C6 and C6-C7 levels somewhat limit evaluation of the cervical vertebral arteries. Within this limitation, the vertebral arteries are patent within the neck bilaterally without significant stenosis. The left vertebral artery is significantly dominant. Skeleton: No acute bony abnormality or aggressive osseous lesion. Disc replacements are present at the C5-C6 and C6-C7 levels. Superimposed cervical spondylosis. No appreciable high-grade bony spinal canal narrowing. Other neck: No neck mass or cervical lymphadenopathy. The left thyroid lobe is poorly delineated and may be atrophic or surgically absent Upper chest: No consolidation within the imaged lung apices Review of the MIP images confirms the above findings CTA HEAD FINDINGS Anterior circulation: The intracranial internal carotid arteries are patent. The M1 middle cerebral arteries are patent without significant stenosis. There is atherosclerotic irregularity of the M2 and more distal MCA branch vessels bilaterally. However, no M2 proximal branch occlusion or high-grade proximal stenosis is identified. The anterior cerebral arteries are patent. Tiny 1 mm inferiorly projecting vascular protrusion arising from the paraclinoid left ICA which may reflect an infundibulum or tiny aneurysm (series  10, image 100). Posterior circulation: The dominant intracranial left vertebral artery is patent without significant stenosis. The non dominant  intracranial right vertebral artery is markedly developmentally diminutive beyond the origin of the right PICA with the distal right V4 vertebral artery poorly delineated. A high-grade stenosis of the distal right vertebral artery cannot be excluded. The basilar artery is patent. Atherosclerotic irregularity of both posterior cerebral arteries. Most notably, there is a high-grade focal stenosis within the left posterior cerebral artery at the P2/P3 junction (series 11, image 12). Posterior communicating arteries are hypoplastic or absent bilaterally. Venous sinuses: Within limitations of contrast timing, no convincing thrombus. Anatomic variants: As described Review of the MIP images confirms the above findings IMPRESSION: CTA neck: 1. The bilateral common and internal carotid arteries are patent within the neck without stenosis. 2. Streak artifact from disc replacements at the C5-C6 and C6-C7 levels limits evaluation of the cervical vertebral arteries. Within this limitation, the vertebral artery are patent within the neck without stenosis. CTA head: 1. The non-dominant intracranial right vertebral artery is markedly developmentally diminutive beyond the origin of the right PICA. The very distal V4 right vertebral artery is poorly delineated and a superimposed high-grade stenosis at this site cannot be excluded. 2. High-grade stenosis within the left posterior cerebral artery at the P2/P3 junction. Background mild atherosclerotic irregularity of both posterior cerebral arteries. 3. Mild atherosclerotic irregularity of the M2 and more distal MCA branches bilaterally. No M2 proximal branch occlusion or high-grade proximal stenosis is demonstrated. Electronically Signed: By: Kellie Simmering DO On: 04/29/2020 20:54   MR ANGIO HEAD WO CONTRAST  Result Date: 04/30/2020 CLINICAL DATA:  Right-sided weakness and numbness. EXAM: MRI HEAD WITHOUT CONTRAST MRA HEAD WITHOUT CONTRAST TECHNIQUE: Multiplanar, multiecho pulse  sequences of the brain and surrounding structures were obtained without intravenous contrast. Angiographic images of the head were obtained using MRA technique without contrast. COMPARISON:  None. FINDINGS: MRI HEAD FINDINGS BRAIN: No acute infarct, acute hemorrhage or extra-axial collection. Normal white matter signal. Normal volume of CSF spaces. No chronic microhemorrhage. Normal midline structures. VASCULAR: Major flow voids are preserved. SKULL AND UPPER CERVICAL SPINE: Normal calvarium and skull base. Visualized upper cervical spine and soft tissues are normal. SINUSES/ORBITS: No paranasal sinus fluid levels or advanced mucosal thickening. No mastoid or middle ear effusion. Normal orbits. MRA HEAD FINDINGS POSTERIOR CIRCULATION: --Vertebral arteries: Normal V4 segments. --Inferior cerebellar arteries: Normal. --Basilar artery: Normal. --Superior cerebellar arteries: Normal. --Posterior cerebral arteries: Normal. ANTERIOR CIRCULATION: --Intracranial internal carotid arteries: Normal. --Anterior cerebral arteries (ACA): Normal. Both A1 segments are present. Patent anterior communicating artery (a-comm). --Middle cerebral arteries (MCA): Normal. IMPRESSION: Normal MRI/MRA of the brain. Electronically Signed   By: Ulyses Jarred M.D.   On: 04/30/2020 01:19   MR BRAIN WO CONTRAST  Result Date: 04/30/2020 CLINICAL DATA:  Right-sided weakness and numbness. EXAM: MRI HEAD WITHOUT CONTRAST MRA HEAD WITHOUT CONTRAST TECHNIQUE: Multiplanar, multiecho pulse sequences of the brain and surrounding structures were obtained without intravenous contrast. Angiographic images of the head were obtained using MRA technique without contrast. COMPARISON:  None. FINDINGS: MRI HEAD FINDINGS BRAIN: No acute infarct, acute hemorrhage or extra-axial collection. Normal white matter signal. Normal volume of CSF spaces. No chronic microhemorrhage. Normal midline structures. VASCULAR: Major flow voids are preserved. SKULL AND UPPER  CERVICAL SPINE: Normal calvarium and skull base. Visualized upper cervical spine and soft tissues are normal. SINUSES/ORBITS: No paranasal sinus fluid levels or advanced mucosal thickening. No mastoid or middle ear effusion. Normal orbits. MRA  HEAD FINDINGS POSTERIOR CIRCULATION: --Vertebral arteries: Normal V4 segments. --Inferior cerebellar arteries: Normal. --Basilar artery: Normal. --Superior cerebellar arteries: Normal. --Posterior cerebral arteries: Normal. ANTERIOR CIRCULATION: --Intracranial internal carotid arteries: Normal. --Anterior cerebral arteries (ACA): Normal. Both A1 segments are present. Patent anterior communicating artery (a-comm). --Middle cerebral arteries (MCA): Normal. IMPRESSION: Normal MRI/MRA of the brain. Electronically Signed   By: Ulyses Jarred M.D.   On: 04/30/2020 01:19   MR Cervical Spine Wo Contrast  Result Date: 04/30/2020 CLINICAL DATA:  Right-sided weakness EXAM: MRI CERVICAL SPINE WITHOUT CONTRAST TECHNIQUE: Multiplanar, multisequence MR imaging of the cervical spine was performed. No intravenous contrast was administered. COMPARISON:  None. FINDINGS: Alignment: Unchanged grade 1 retrolisthesis at C5-6 Vertebrae: Intervertebral spacer devices at C5-6 and C6-7. Cord: Normal signal and morphology. Posterior Fossa, vertebral arteries, paraspinal tissues: Negative. Disc levels: C1-2: Unremarkable. C2-3: Normal disc space and facet joints. There is no spinal canal stenosis. No neural foraminal stenosis. C3-4: Bilateral uncovertebral hypertrophy. There is no spinal canal stenosis. Mild bilateral neural foraminal stenosis. C4-5: Left-greater-than-right facet hypertrophy. Mild endplate spurring. There is no spinal canal stenosis. Mild bilateral neural foraminal stenosis. C5-6: Postsurgical changes. Susceptibility effects obscure the anterior spinal canal but there is no visible spinal canal stenosis. Neural foramina are patent. C6-7: Postsurgical changes. Left-greater-than-right  facet hypertrophy. Susceptibility effects obscure the anterior spinal canal. There is no visible spinal canal stenosis. Neural foramina are patent. C7-T1: Normal disc space and facet joints. There is no spinal canal stenosis. No neural foraminal stenosis. IMPRESSION: 1. Postsurgical changes at C5-6 and C6-7 with patent spinal canal and neural foramina at these levels. 2. Mild bilateral neural foraminal stenosis at C3-4 and C4-5. Electronically Signed   By: Ulyses Jarred M.D.   On: 04/30/2020 01:12   DG SWALLOW [facility] SPEECH PATH  Result Date: 04/08/2020 Objective Swallowing Evaluation: Type of Study: MBS-Modified Barium Swallow Study  Patient Details Name: WINDY DUDEK MRN: [id number] Date of Birth: [birth year] Today's Date: 04/08/2020 Time: SLP Start Time (ACUTE ONLY): 8101 -SLP Stop Time (ACUTE ONLY): 1148 SLP Time Calculation (min) (ACUTE ONLY): 17 min Past Medical History: Past Medical History: Diagnosis Date . Cancer (HCC)   OVARIAN . Colonic polyp  . Hyperlipidemia  . Hypertension  . Sjogren's disease ([place])  . Thyroid disease   HYPOTHYROIDISM Past Surgical History: Past Surgical History: Procedure Laterality Date . ABDOMINAL HYSTERECTOMY    Ovarian cancer . BREAST CYST ASPIRATION   . CESAREAN SECTION    x2 . COLONOSCOPY  10-06  Dr. Earnest Bailey . TONSILLECTOMY   HPI: 58 yr old seen for outpatient MBS with complaints of globus sesnation and regurtiation of pill 15 minutes after consumption. History anterior cervical disectomy with mild odonophagia immediately after that resovleved and returned one month ago. She reports her throat feels as if it is swollen.  No data recorded Assessment / Plan / Recommendation CHL IP CLINICAL IMPRESSIONS 04/08/2020 Clinical Impression Pt's oral and pharyngeal swallow abillity was within normal limits. The tissue below the esophageal sphincter appeared somewhat edematous. The pill lodged in her valleculae until spontaneous second swallow 3-5 second delay which she had difficulty with  at home on one ocassion. Timing, coordination, and laryngeal closure effective to prevent any penetration or aspiration during this assessment and risk is low. MBS does not diagnose below the UES however esohageal scan did not reveal findings to note. Educated pt to use caution with meat and bread and ensure meats are moist and consume pills 1-2 at a time. If globus sensation, swallow  additional time or use thin or puree to transit. Recommend continue regular, thin, straws.      SLP Visit Diagnosis Dysphagia, unspecified (R13.10) Attention and concentration deficit following -- Frontal lobe and executive function deficit following -- Impact on safety and function Mild aspiration risk   CHL IP TREATMENT RECOMMENDATION 04/08/2020 Treatment Recommendations No treatment recommended at this time   No flowsheet data found. CHL IP DIET RECOMMENDATION 04/08/2020 SLP Diet Recommendations Regular solids;Thin liquid Liquid Administration via Cup;Straw Medication Administration Whole meds with liquid Compensations Multiple dry swallows after each bite/sip;Other (Comment) Postural Changes Seated upright at 90 degrees   CHL IP OTHER RECOMMENDATIONS 04/08/2020 Recommended Consults -- Oral Care Recommendations Oral care BID Other Recommendations --   CHL IP FOLLOW UP RECOMMENDATIONS 04/08/2020 Follow up Recommendations None   No flowsheet data found.     CHL IP ORAL PHASE 04/08/2020 Oral Phase WFL Oral - Pudding Teaspoon -- Oral - Pudding Cup -- Oral - Honey Teaspoon -- Oral - Honey Cup -- Oral - Nectar Teaspoon -- Oral - Nectar Cup -- Oral - Nectar Straw -- Oral - Thin Teaspoon -- Oral - Thin Cup -- Oral - Thin Straw -- Oral - Puree -- Oral - Mech Soft -- Oral - Regular -- Oral - Multi-Consistency -- Oral - Pill -- Oral Phase - Comment --  CHL IP PHARYNGEAL PHASE 04/08/2020 Pharyngeal Phase WFL Pharyngeal- Pudding Teaspoon -- Pharyngeal -- Pharyngeal- Pudding Cup -- Pharyngeal -- Pharyngeal- Honey Teaspoon -- Pharyngeal -- Pharyngeal-  Honey Cup -- Pharyngeal -- Pharyngeal- Nectar Teaspoon -- Pharyngeal -- Pharyngeal- Nectar Cup -- Pharyngeal -- Pharyngeal- Nectar Straw -- Pharyngeal -- Pharyngeal- Thin Teaspoon -- Pharyngeal -- Pharyngeal- Thin Cup -- Pharyngeal -- Pharyngeal- Thin Straw -- Pharyngeal -- Pharyngeal- Puree -- Pharyngeal -- Pharyngeal- Mechanical Soft -- Pharyngeal -- Pharyngeal- Regular -- Pharyngeal -- Pharyngeal- Multi-consistency -- Pharyngeal -- Pharyngeal- Pill -- Pharyngeal -- Pharyngeal Comment --  CHL IP CERVICAL ESOPHAGEAL PHASE 04/08/2020 Cervical Esophageal Phase WFL Pudding Teaspoon -- Pudding Cup -- Honey Teaspoon -- Honey Cup -- Nectar Teaspoon -- Nectar Cup -- Nectar Straw -- Thin Teaspoon -- Thin Cup -- Thin Straw -- Puree -- Mechanical Soft -- Regular -- Multi-consistency -- Pill -- Cervical Esophageal Comment -- [place] Siren 04/08/2020, 2:16 PM Orbie Pyo Litaker M.Ed Actor Pager [phone] Office [phone]              ECHOCARDIOGRAM COMPLETE  Result Date: 04/30/2020    ECHOCARDIOGRAM REPORT   Patient Name:   TRISTYN PHARRIS Date of Exam: 04/30/2020 Medical Rec #:  [id number]        Height:       60.0 in Accession #:    [id number]       Weight:       130.1 lb Date of Birth:  [birth year]         BSA:          1.555 m Patient Age:    48 years         BP:           113/62 mmHg Patient Gender: F                HR:           64 bpm. Exam Location:  Inpatient Procedure: 2D Echo Indications:    Stroke I163.9  History:        Patient has prior history of Echocardiogram examinations, most  recent 06/21/2018. Risk Factors:Hypertension and Dyslipidemia.  Sonographer:    Mikki Santee RDCS (AE) Referring Phys: [place]  1. Left ventricular ejection fraction, by estimation, is 60 to 65%. The left ventricle has normal function. The left ventricle has no regional wall motion abnormalities. Left ventricular diastolic parameters were normal.  2. Right  ventricular systolic function is normal. The right ventricular size is normal. Tricuspid regurgitation signal is inadequate for assessing PA pressure.  3. The mitral valve is normal in structure. No evidence of mitral valve regurgitation.  4. The aortic valve was not well visualized. Aortic valve regurgitation is not visualized. No aortic stenosis is present.  5. The inferior vena cava is normal in size with greater than 50% respiratory variability, suggesting right atrial pressure of 3 mmHg. FINDINGS  Left Ventricle: Left ventricular ejection fraction, by estimation, is 60 to 65%. The left ventricle has normal function. The left ventricle has no regional wall motion abnormalities. The left ventricular internal cavity size was normal in size. There is  no left ventricular hypertrophy. Left ventricular diastolic parameters were normal. Right Ventricle: The right ventricular size is normal. Right vetricular wall thickness was not assessed. Right ventricular systolic function is normal. Tricuspid regurgitation signal is inadequate for assessing PA pressure. Left Atrium: Left atrial size was normal in size. Right Atrium: Right atrial size was normal in size. Pericardium: There is no evidence of pericardial effusion. Mitral Valve: The mitral valve is normal in structure. No evidence of mitral valve regurgitation. Tricuspid Valve: The tricuspid valve is normal in structure. Tricuspid valve regurgitation is not demonstrated. Aortic Valve: The aortic valve was not well visualized. Aortic valve regurgitation is not visualized. No aortic stenosis is present. Pulmonic Valve: The pulmonic valve was not well visualized. Pulmonic valve regurgitation is not visualized. Aorta: The aortic root and ascending aorta are structurally normal, with no evidence of dilitation. Venous: The inferior vena cava is normal in size with greater than 50% respiratory variability, suggesting right atrial pressure of 3 mmHg. IAS/Shunts: The  interatrial septum was not well visualized.  LEFT VENTRICLE PLAX 2D LVIDd:         4.10 cm  Diastology LVIDs:         2.40 cm  LV e' lateral:   12.20 cm/s LV PW:         0.90 cm  LV E/e' lateral: 6.2 LV IVS:        0.80 cm  LV e' medial:    8.92 cm/s LVOT diam:     2.10 cm  LV E/e' medial:  8.5 LV SV:         65 LV SV Index:   42 LVOT Area:     3.46 cm  RIGHT VENTRICLE RV S prime:     10.40 cm/s TAPSE (M-mode): 1.6 cm LEFT ATRIUM             Index       RIGHT ATRIUM           Index LA diam:        3.20 cm 2.06 cm/m  RA Area:     12.30 cm LA Vol (A2C):   44.1 ml 28.37 ml/m RA Volume:   26.50 ml  17.05 ml/m LA Vol (A4C):   24.3 ml 15.63 ml/m LA Biplane Vol: 33.0 ml 21.23 ml/m  AORTIC VALVE LVOT Vmax:   85.50 cm/s LVOT Vmean:  57.200 cm/s LVOT VTI:    0.188 m  AORTA Ao Root  diam: 3.10 cm MITRAL VALVE MV Area (PHT): 3.91 cm    SHUNTS MV Decel Time: 194 msec    Systemic VTI:  0.19 m MV E velocity: 75.80 cm/s  Systemic Diam: 2.10 cm MV A velocity: 63.00 cm/s MV E/A ratio:  1.20 Oswaldo Milian MD Electronically signed by Oswaldo Milian MD Signature Date/Time: 04/30/2020/12:50:58 PM    Final    CT HEAD CODE STROKE WO CONTRAST  Addendum Date: 04/29/2020   ADDENDUM REPORT: 04/29/2020 20:33 ADDENDUM: These results were called by telephone at the time of interpretation on 04/29/2020 at 8:32 pm to provider Dr. Milas Gain , who verbally acknowledged these results. Electronically Signed   By: Ulyses Jarred M.D.   On: 04/29/2020 20:33   Result Date: 04/29/2020 CLINICAL DATA:  Code stroke.  Right-sided weakness and numbness EXAM: CT HEAD WITHOUT CONTRAST TECHNIQUE: Contiguous axial images were obtained from the base of the skull through the vertex without intravenous contrast. COMPARISON:  None. FINDINGS: Brain: There is no mass, hemorrhage or extra-axial collection. The size and configuration of the ventricles and extra-axial CSF spaces are normal. The brain parenchyma is normal, without evidence of acute or  chronic infarction. Vascular: No abnormal hyperdensity of the major intracranial arteries or dural venous sinuses. No intracranial atherosclerosis. Skull: The visualized skull base, calvarium and extracranial soft tissues are normal. Sinuses/Orbits: No fluid levels or advanced mucosal thickening of the visualized paranasal sinuses. No mastoid or middle ear effusion. The orbits are normal. ASPECTS [facility] Stroke Program Early CT Score) - Ganglionic level infarction (caudate, lentiform nuclei, internal capsule, insula, M1-M3 cortex): 7 - Supraganglionic infarction (M4-M6 cortex): 3 Total score (0-10 with 10 being normal): 10 IMPRESSION: 1. Normal head CT. 2. ASPECTS is 10. Dr. Milas Gain paged at [place] PM. Electronically Signed: By: Ulyses Jarred M.D. On: 04/29/2020 20:14   CT ANGIO HEAD CODE STROKE  Addendum Date: 04/29/2020   ADDENDUM REPORT: 04/29/2020 21:58 ADDENDUM: These results were called by telephone at the time of interpretation on 04/29/2020 at 8:58 pm to provider [facility] , who verbally acknowledged these results. Electronically Signed   By: Kellie Simmering DO   On: 04/29/2020 21:58   Result Date: 04/29/2020 CLINICAL DATA:  Stroke/TIA, assess intracranial arteries. Right-sided weakness. EXAM: CT ANGIOGRAPHY HEAD AND NECK TECHNIQUE: Multidetector CT imaging of the head and neck was performed using the standard protocol during bolus administration of intravenous contrast. Multiplanar CT image reconstructions and MIPs were obtained to evaluate the vascular anatomy. Carotid stenosis measurements (when applicable) are obtained utilizing NASCET criteria, using the distal internal carotid diameter as the denominator. CONTRAST:  98mL OMNIPAQUE IOHEXOL 350 MG/ML SOLN COMPARISON:  Noncontrast head CT performed earlier the same day 04/29/2020. FINDINGS: CTA NECK FINDINGS Aortic arch: Standard aortic branching. The visualized aortic arch is unremarkable. No hemodynamically significant innominate or proximal  subclavian artery stenosis. Right carotid system: CCA and ICA patent within the neck without stenosis. No significant atherosclerotic disease. Left carotid system: CCA and ICA patent within the neck without stenosis. No significant atherosclerotic disease. Vertebral arteries: Streak artifact from disc replacements at the C5-C6 and C6-C7 levels somewhat limit evaluation of the cervical vertebral arteries. Within this limitation, the vertebral arteries are patent within the neck bilaterally without significant stenosis. The left vertebral artery is significantly dominant. Skeleton: No acute bony abnormality or aggressive osseous lesion. Disc replacements are present at the C5-C6 and C6-C7 levels. Superimposed cervical spondylosis. No appreciable high-grade bony spinal canal narrowing. Other neck: No neck mass or cervical lymphadenopathy. The left  thyroid lobe is poorly delineated and may be atrophic or surgically absent Upper chest: No consolidation within the imaged lung apices Review of the MIP images confirms the above findings CTA HEAD FINDINGS Anterior circulation: The intracranial internal carotid arteries are patent. The M1 middle cerebral arteries are patent without significant stenosis. There is atherosclerotic irregularity of the M2 and more distal MCA branch vessels bilaterally. However, no M2 proximal branch occlusion or high-grade proximal stenosis is identified. The anterior cerebral arteries are patent. Tiny 1 mm inferiorly projecting vascular protrusion arising from the paraclinoid left ICA which may reflect an infundibulum or tiny aneurysm (series 10, image 100). Posterior circulation: The dominant intracranial left vertebral artery is patent without significant stenosis. The non dominant intracranial right vertebral artery is markedly developmentally diminutive beyond the origin of the right PICA with the distal right V4 vertebral artery poorly delineated. A high-grade stenosis of the distal right  vertebral artery cannot be excluded. The basilar artery is patent. Atherosclerotic irregularity of both posterior cerebral arteries. Most notably, there is a high-grade focal stenosis within the left posterior cerebral artery at the P2/P3 junction (series 11, image 12). Posterior communicating arteries are hypoplastic or absent bilaterally. Venous sinuses: Within limitations of contrast timing, no convincing thrombus. Anatomic variants: As described Review of the MIP images confirms the above findings IMPRESSION: CTA neck: 1. The bilateral common and internal carotid arteries are patent within the neck without stenosis. 2. Streak artifact from disc replacements at the C5-C6 and C6-C7 levels limits evaluation of the cervical vertebral arteries. Within this limitation, the vertebral artery are patent within the neck without stenosis. CTA head: 1. The non-dominant intracranial right vertebral artery is markedly developmentally diminutive beyond the origin of the right PICA. The very distal V4 right vertebral artery is poorly delineated and a superimposed high-grade stenosis at this site cannot be excluded. 2. High-grade stenosis within the left posterior cerebral artery at the P2/P3 junction. Background mild atherosclerotic irregularity of both posterior cerebral arteries. 3. Mild atherosclerotic irregularity of the M2 and more distal MCA branches bilaterally. No M2 proximal branch occlusion or high-grade proximal stenosis is demonstrated. Electronically Signed: By: Kellie Simmering DO On: 04/29/2020 20:54        Subjective: Patient is awake and alert, no nausea or vomiting, strength has improved, continue with mild paresthesias on the right side.    Discharge Exam: Vitals:   04/30/20 0800 04/30/20 1049  BP:  118/71  Pulse:  64  Resp: 12 18  Temp:  97.7 F (36.5 C)  SpO2:  100%   Vitals:   04/30/20 0700 04/30/20 0753 04/30/20 0800 04/30/20 1049  BP:  113/62  118/71  Pulse:  74  64  Resp: 12 18 12 18    Temp:  98.3 F (36.8 C)  97.7 F (36.5 C)  TempSrc:  Oral  Oral  SpO2:  99%  100%  Weight:      Height:        General: Not in pain or dyspnea.  Neurology: Awake and alert, preserved strength upper and lower extremities.  E ENT: no pallor, no icterus, oral mucosa moist Cardiovascular: No JVD. S1-S2 present, rhythmic, no gallops, rubs, or murmurs. No lower extremity edema. Pulmonary: positive breath sounds bilaterally,  Gastrointestinal. Abdomen soft and non tender  Musculoskeletal: no joint deformities   The results of significant diagnostics from this hospitalization (including imaging, microbiology, ancillary and laboratory) are listed below for reference.     Microbiology: Recent Results (from the past 240 hour(s))  SARS Coronavirus 2 by RT PCR (hospital order, performed in [facility] hospital lab) Nasopharyngeal Nasopharyngeal Swab     Status: None   Collection Time: 04/29/20  9:44 PM   Specimen: Nasopharyngeal Swab  Result Value Ref Range Status   SARS Coronavirus 2 NEGATIVE NEGATIVE Final    Comment: (NOTE) SARS-CoV-2 target nucleic acids are NOT DETECTED.  The SARS-CoV-2 RNA is generally detectable in upper and lower respiratory specimens during the acute phase of infection. The lowest concentration of SARS-CoV-2 viral copies this assay can detect is 250 copies / mL. A negative result does not preclude SARS-CoV-2 infection and should not be used as the sole basis for treatment or other patient management decisions.  A negative result may occur with improper specimen collection / handling, submission of specimen other than nasopharyngeal swab, presence of viral mutation(s) within the areas targeted by this assay, and inadequate number of viral copies (<250 copies / mL). A negative result must be combined with clinical observations, patient history, and epidemiological information.  Fact Sheet for Patients:   StrictlyIdeas.no  Fact Sheet  for Healthcare Providers: BankingDealers.co.za  This test is not yet approved or  cleared by the [place] FDA and has been authorized for detection and/or diagnosis of SARS-CoV-2 by FDA under an Emergency Use Authorization (EUA).  This EUA will remain in effect (meaning this test can be used) for the duration of the COVID-19 declaration under Section 564(b)(1) of the Act, 21 U.S.C. section 360bbb-3(b)(1), unless the authorization is terminated or revoked sooner.  Performed at [facility], [place] [address] [place], [place] [address]      Labs: BNP (last 3 results) No results for input(s): BNP in the last 8760 hours. Basic Metabolic Panel: Recent Labs  Lab 04/29/20 2002 04/29/20 2017 04/29/20 2243 04/30/20 0410  NA 139 139  --  137  K 3.9 3.7  --  3.6  CL 101 99  --  103  CO2 26  --   --  25  GLUCOSE 102* 98  --  87  BUN 18 21*  --  13  CREATININE 1.00 0.90 0.87 0.81  CALCIUM 9.7  --   --  9.2   Liver Function Tests: Recent Labs  Lab 04/29/20 2002 04/30/20 0410  AST 30 25  ALT 29 25  ALKPHOS 61 53  BILITOT 0.7 0.5  PROT 6.9 6.0*  ALBUMIN 4.0 3.4*   No results for input(s): LIPASE, AMYLASE in the last 168 hours. No results for input(s): AMMONIA in the last 168 hours. CBC: Recent Labs  Lab 04/29/20 2002 04/29/20 2017 04/29/20 2243 04/30/20 0410  WBC 8.1  --  7.6 6.6  NEUTROABS 4.7  --   --   --   HGB 13.7 13.9 12.5 12.7  HCT 41.1 41.0 37.6 37.8  MCV 90.9  --  90.2 90.6  PLT 291  --  239 234   Cardiac Enzymes: No results for input(s): CKTOTAL, CKMB, CKMBINDEX, TROPONINI in the last 168 hours. BNP: Invalid input(s): POCBNP CBG: Recent Labs  Lab 04/29/20 1944  GLUCAP 104*   D-Dimer No results for input(s): DDIMER in the last 72 hours. Hgb A1c Recent Labs    04/30/20 0410  HGBA1C 5.6   Lipid Profile Recent Labs    04/30/20 0410  CHOL 207*  HDL 52  LDLCALC 110*  TRIG 224*  CHOLHDL 4.0   Thyroid function  studies No results for input(s): TSH, T4TOTAL, T3FREE, THYROIDAB in the last 72 hours.  Invalid input(s): FREET3 Anemia work up No results for input(s): VITAMINB12, FOLATE, FERRITIN, TIBC, IRON, RETICCTPCT in the last 72 hours. Urinalysis    Component Value Date/Time   COLORURINE YELLOW 04/29/2020 2103   APPEARANCEUR CLOUDY (A) 04/29/2020 2103   LABSPEC 1.044 (H) 04/29/2020 2103   PHURINE 6.0 04/29/2020 2103   GLUCOSEU NEGATIVE 04/29/2020 2103   HGBUR SMALL (A) 04/29/2020 2103   BILIRUBINUR NEGATIVE 04/29/2020 2103   BILIRUBINUR neg 03/23/2018 1431   KETONESUR 5 (A) 04/29/2020 2103   PROTEINUR NEGATIVE 04/29/2020 2103   UROBILINOGEN 0.2 03/23/2018 1431   UROBILINOGEN 0.2 07/16/2014 1547   NITRITE NEGATIVE 04/29/2020 2103   LEUKOCYTESUR LARGE (A) 04/29/2020 2103   Sepsis Labs Invalid input(s): PROCALCITONIN,  WBC,  LACTICIDVEN Microbiology Recent Results (from the past 240 hour(s))  SARS Coronavirus 2 by RT PCR (hospital order, performed in [place] hospital lab) Nasopharyngeal Nasopharyngeal Swab     Status: None   Collection Time: 04/29/20  9:44 PM   Specimen: Nasopharyngeal Swab  Result Value Ref Range Status   SARS Coronavirus 2 NEGATIVE NEGATIVE Final    Comment: (NOTE) SARS-CoV-2 target nucleic acids are NOT DETECTED.  The SARS-CoV-2 RNA is generally detectable in upper and lower respiratory specimens during the acute phase of infection. The lowest concentration of SARS-CoV-2 viral copies this assay can detect is 250 copies / mL. A negative result does not preclude SARS-CoV-2 infection and should not be used as the sole basis for treatment or other patient management decisions.  A negative result may occur with improper specimen collection / handling, submission of specimen other than nasopharyngeal swab, presence of viral mutation(s) within the areas targeted by this assay, and inadequate number of viral copies (<250 copies / mL). A negative result must be  combined with clinical observations, patient history, and epidemiological information.  Fact Sheet for Patients:   StrictlyIdeas.no  Fact Sheet for Healthcare Providers: BankingDealers.co.za  This test is not yet approved or  cleared by the [place] FDA and has been authorized for detection and/or diagnosis of SARS-CoV-2 by FDA under an Emergency Use Authorization (EUA).  This EUA will remain in effect (meaning this test can be used) for the duration of the COVID-19 declaration under Section 564(b)(1) of the Act, 21 U.S.C. section 360bbb-3(b)(1), unless the authorization is terminated or revoked sooner.  Performed at [facility], [place] [address], [place], [place] [address]      Time coordinating discharge: 45 minutes  SIGNED:   Tawni Millers, MD  [facility] 04/30/2020, 1:55 PM

## 2020-05-06 DIAGNOSIS — F419 Anxiety disorder, unspecified: Secondary | ICD-10-CM | POA: Diagnosis not present

## 2020-05-06 MED FILL — MYRBETRIQ ER 50 MG TABLET: 50 | 30 days supply | Qty: 30 | Fill #3

## 2020-05-06 MED FILL — buPROPion HCL ER (XL) 300 M: 300 | 30 days supply | Qty: 30 | Fill #1

## 2020-05-07 ENCOUNTER — Encounter: Payer: Self-pay | Admitting: Family Medicine

## 2020-05-07 ENCOUNTER — Ambulatory Visit: Payer: 59

## 2020-05-08 MED FILL — CEPHALEXIN 500 MG CAPSULE: 500 | 4 days supply | Qty: 12 | Fill #0

## 2020-05-21 ENCOUNTER — Encounter: Payer: Self-pay | Admitting: Family Medicine

## 2020-05-21 ENCOUNTER — Ambulatory Visit: Payer: 59 | Admitting: Family Medicine

## 2020-05-21 ENCOUNTER — Other Ambulatory Visit: Payer: Self-pay

## 2020-05-21 VITALS — BP 110/68 | HR 73 | Temp 98.8°F | Wt 131.0 lb

## 2020-05-21 DIAGNOSIS — G459 Transient cerebral ischemic attack, unspecified: Secondary | ICD-10-CM

## 2020-05-21 NOTE — Progress Notes (Signed)
   Subjective:    Patient ID: [place] Cellar, female    DOB: February 24, 1961, 59 y.o.   MRN: [id number]  HPI She is here for follow-up after recent episode of TIA.  She was supposed to come in 1 week after the discharge but was unable to get it arranged.  She is scheduled to see the neurology clinic for follow-up this week.  Presently she is having no weakness, numbness, tingling, blurred or double vision.  She does note that her fine motor skills have returned to normal.   Review of Systems     Objective:   Physical Exam Alert and in no distress.  Cardiac exam shows regular rhythm without murmurs or gallops.  Lungs are clear to auscultation.  Her medications were reviewed.       Assessment & Plan:  TIA (transient ischemic attack) She will continue on her present medication regimen including the statin as well as Plavix.  We will check her lipid panel in about 2 months.  She was comfortable with that.

## 2020-05-23 ENCOUNTER — Encounter: Payer: Self-pay | Admitting: Family Medicine

## 2020-05-23 MED FILL — LORazepam 1 MG TABS: 1 | 30 days supply | Qty: 15 | Fill #0

## 2020-05-24 ENCOUNTER — Other Ambulatory Visit (HOSPITAL_COMMUNITY): Payer: Self-pay | Admitting: Obstetrics & Gynecology

## 2020-05-24 ENCOUNTER — Ambulatory Visit
Admission: RE | Admit: 2020-05-24 | Discharge: 2020-05-24 | Disposition: A | Discharge status: Home / Self Care | Payer: 59 | Source: Ambulatory Visit | Attending: Obstetrics & Gynecology | Admitting: Obstetrics & Gynecology

## 2020-05-24 ENCOUNTER — Other Ambulatory Visit: Payer: Self-pay

## 2020-05-24 ENCOUNTER — Ambulatory Visit
Admission: RE | Admit: 2020-05-24 | Discharge: 2020-05-24 | Disposition: A | Discharge status: Home / Self Care | Payer: 59 | Source: Ambulatory Visit | Attending: Family Medicine | Admitting: Family Medicine

## 2020-05-24 ENCOUNTER — Encounter: Payer: Self-pay | Admitting: Family Medicine

## 2020-05-24 DIAGNOSIS — Z13820 Encounter for screening for osteoporosis: Secondary | ICD-10-CM

## 2020-05-24 DIAGNOSIS — M79672 Pain in left foot: Secondary | ICD-10-CM

## 2020-05-24 DIAGNOSIS — S9032XA Contusion of left foot, initial encounter: Secondary | ICD-10-CM | POA: Diagnosis not present

## 2020-05-24 DIAGNOSIS — Z780 Asymptomatic menopausal state: Secondary | ICD-10-CM | POA: Diagnosis not present

## 2020-05-24 IMAGING — CR DG FOOT COMPLETE 3+V*L*
3 series · 3 of 3 positions shown · non-contrast
Comparison: None.

CLINICAL DATA: Bruising, swelling, and pain over the dorsal aspect
of the left foot for 2 days. No known injury.

EXAM:
LEFT FOOT - COMPLETE 3+ VIEW

[x foot ap left]
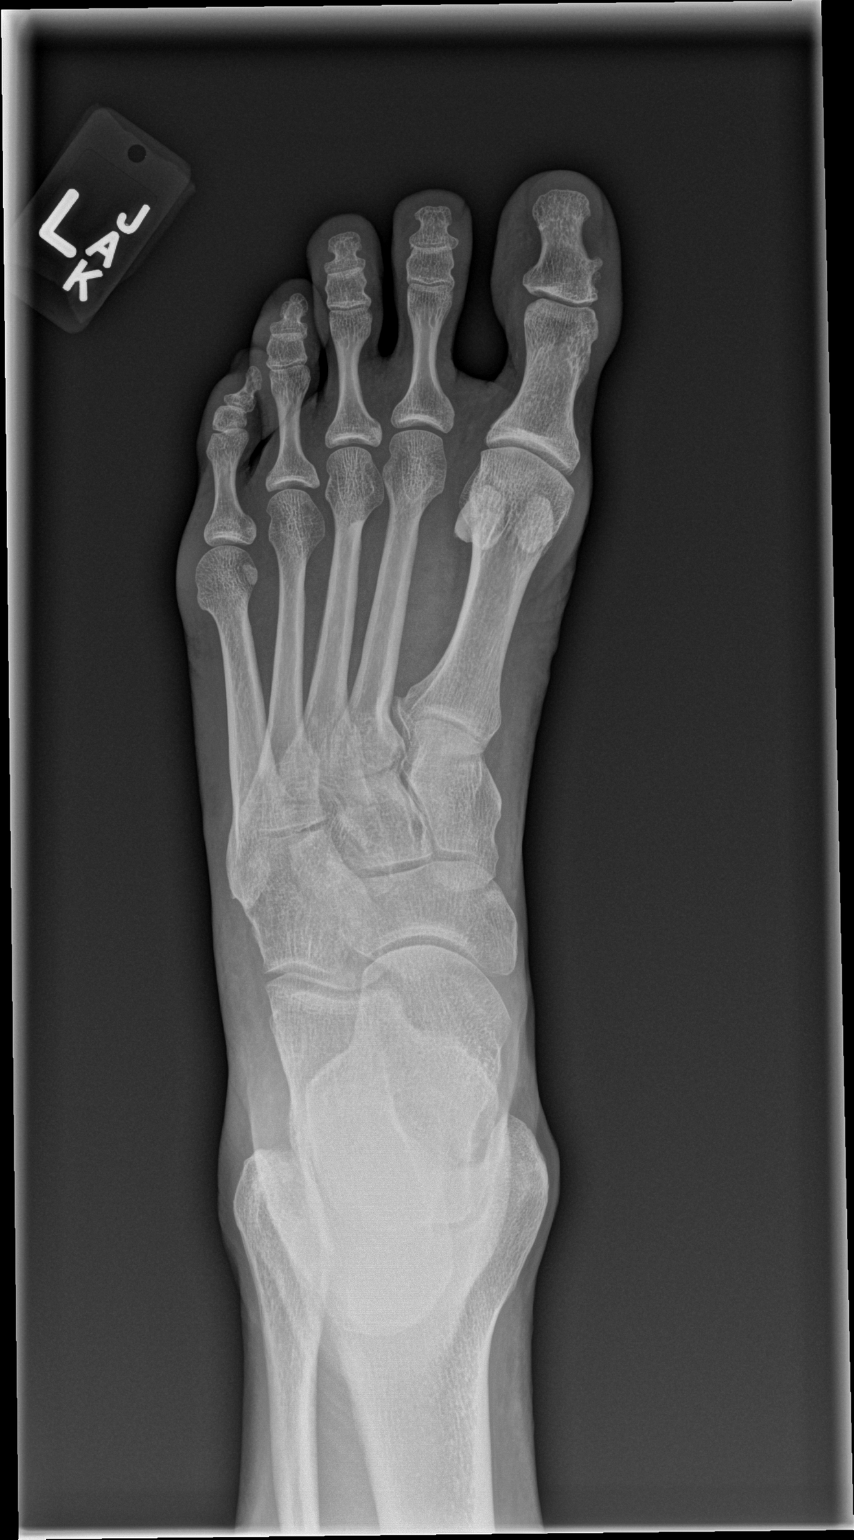

[x foot obl left]
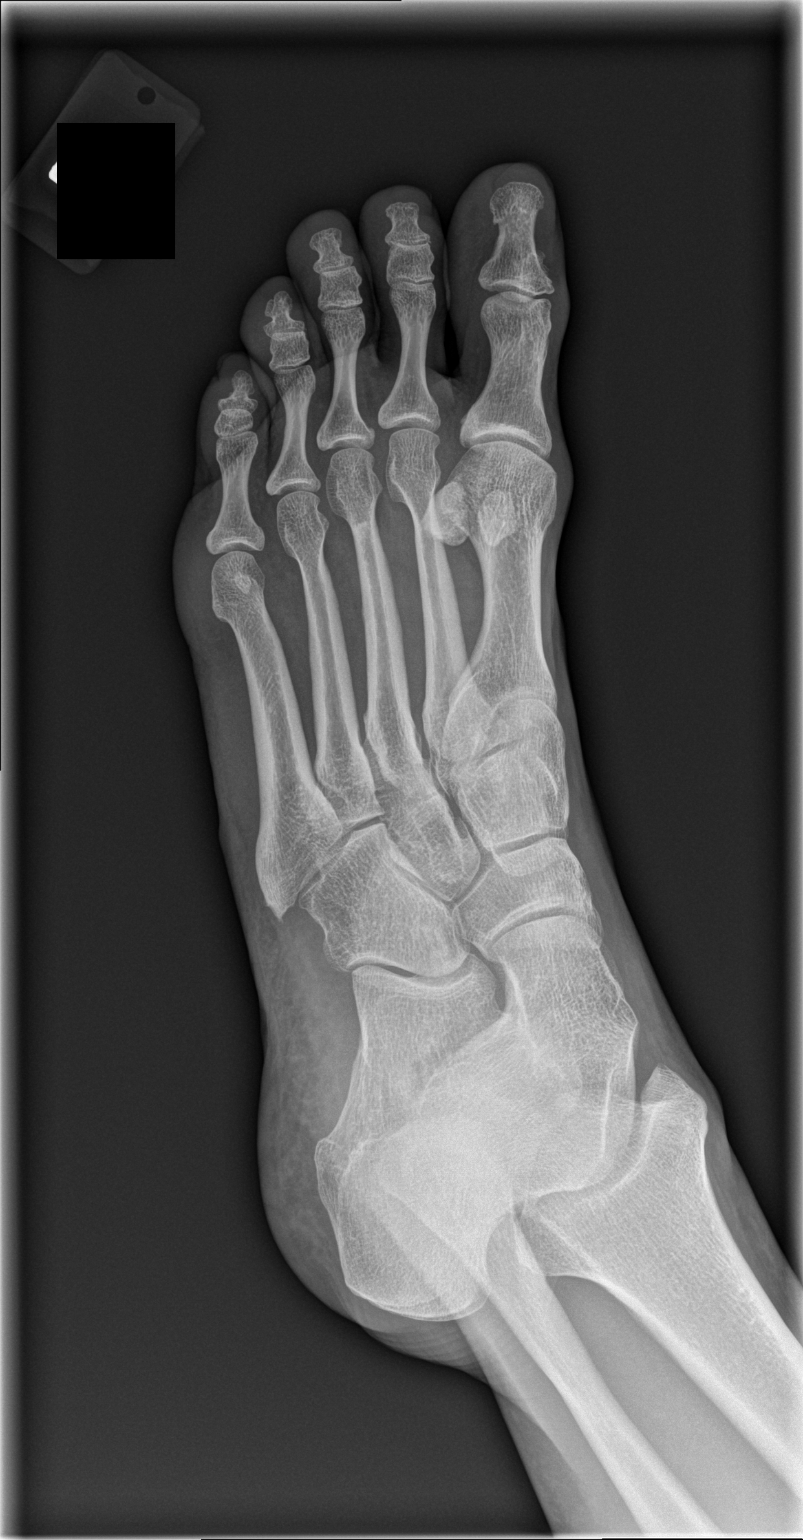

[x foot lat left]
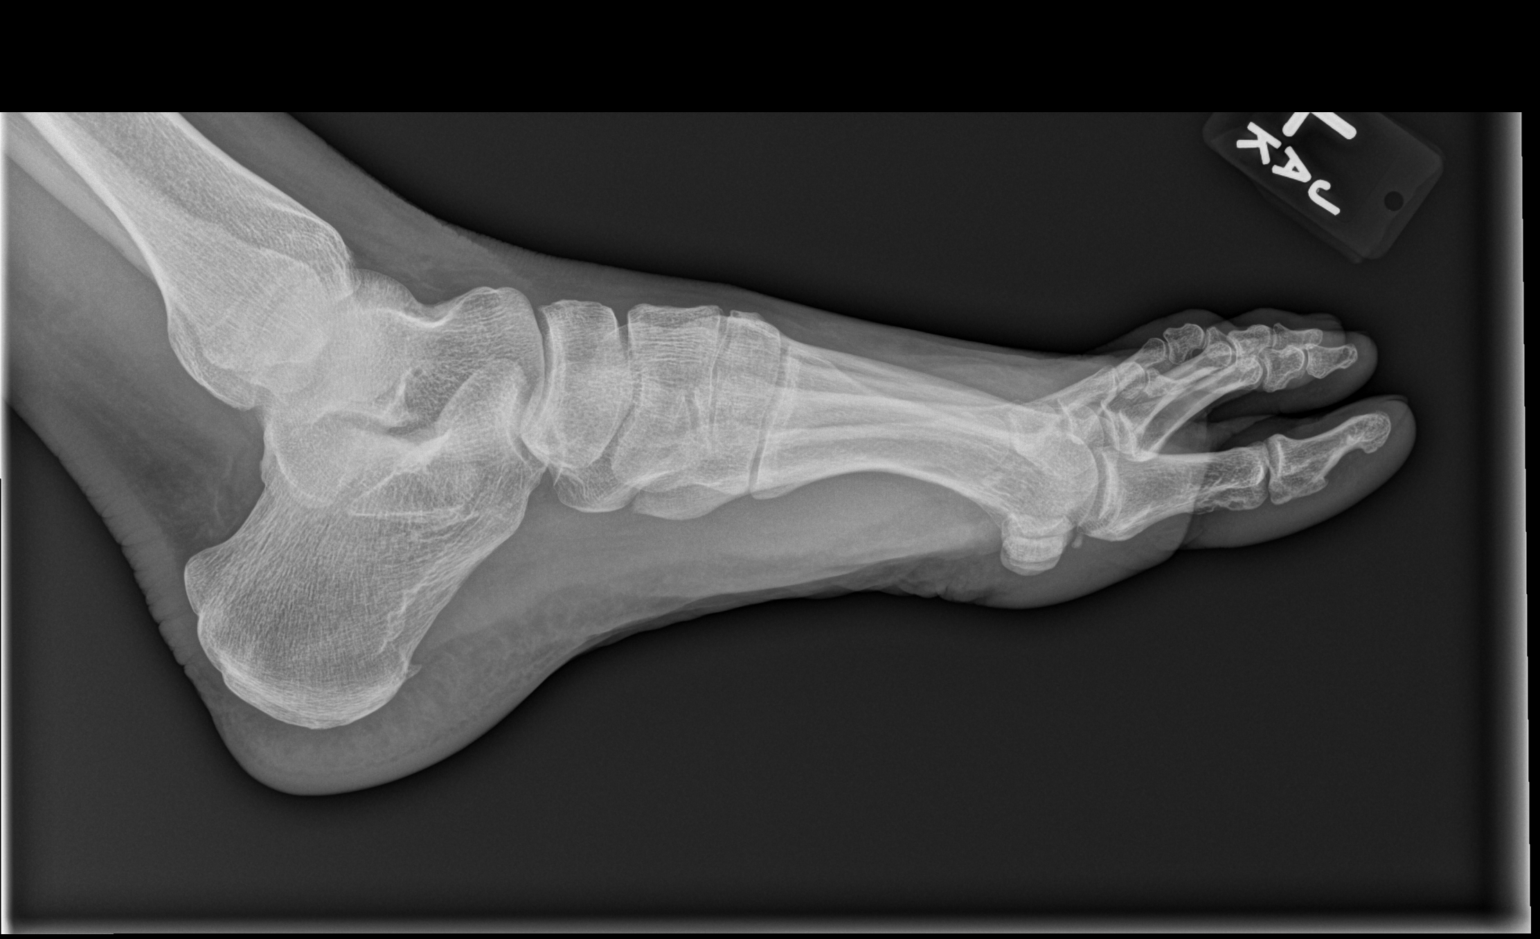

[3 of 3 positions shown; findings below may reference images not displayed]

FINDINGS: There is no evidence of fracture or dislocation. There is no
evidence of arthropathy or other focal bone abnormality. Soft
tissues are unremarkable.
IMPRESSION: Negative.

## 2020-05-24 MED FILL — LEVOTHYROXINE 88 MCG TABLET: 88 | 90 days supply | Qty: 90 | Fill #0

## 2020-05-25 MED FILL — guanFACINE HCL 2 MG TABS: 2 | 30 days supply | Qty: 60 | Fill #2

## 2020-05-25 MED FILL — FLUARIX QUADRIVALENT 0.5 ML: 0.5 | 30 days supply | Qty: 1 | Fill #0

## 2020-05-27 DIAGNOSIS — F902 Attention-deficit hyperactivity disorder, combined type: Secondary | ICD-10-CM | POA: Diagnosis not present

## 2020-05-27 DIAGNOSIS — F419 Anxiety disorder, unspecified: Secondary | ICD-10-CM | POA: Diagnosis not present

## 2020-05-28 ENCOUNTER — Ambulatory Visit: Payer: 59 | Admitting: Adult Health

## 2020-05-28 ENCOUNTER — Encounter: Payer: Self-pay | Admitting: Adult Health

## 2020-05-28 VITALS — BP 104/63 | HR 74 | Ht 65.0 in | Wt 131.0 lb

## 2020-05-28 DIAGNOSIS — G459 Transient cerebral ischemic attack, unspecified: Secondary | ICD-10-CM

## 2020-05-28 DIAGNOSIS — I10 Essential (primary) hypertension: Secondary | ICD-10-CM

## 2020-05-28 DIAGNOSIS — E785 Hyperlipidemia, unspecified: Secondary | ICD-10-CM

## 2020-05-28 NOTE — Patient Instructions (Addendum)
Continue aspirin 81 mg daily  and atorvastatin  for secondary stroke prevention  Continue to follow up with PCP regarding cholesterol and blood pressure management  Maintain strict control of hypertension with blood pressure goal below 130/90 and cholesterol with LDL cholesterol (bad cholesterol) goal below 70 mg/dL.     Followup in the future with me in 6 months or call earlier if needed      Thank you for coming to see [place] at [facility]. I hope we have been able to provide you high quality care today.  You may receive a patient satisfaction survey over the next few weeks. We would appreciate your feedback and comments so that we may continue to improve ourselves and the health of our patients.     Stroke Prevention Some medical conditions and behaviors are associated with a higher chance of having a stroke. You can help prevent a stroke by making nutrition, lifestyle, and other changes, including managing any medical conditions you may have. What nutrition changes can be made?   Eat healthy foods. You can do this by: ? Choosing foods high in fiber, such as fresh fruits and vegetables and whole grains. ? Eating at least 5 or more servings of fruits and vegetables a day. Try to fill half of your plate at each meal with fruits and vegetables. ? Choosing lean protein foods, such as lean cuts of meat, poultry without skin, fish, tofu, beans, and nuts. ? Eating low-fat dairy products. ? Avoiding foods that are high in salt (sodium). This can help lower blood pressure. ? Avoiding foods that have saturated fat, trans fat, and cholesterol. This can help prevent high cholesterol. ? Avoiding processed and premade foods.  Follow your health care provider's specific guidelines for losing weight, controlling high blood pressure (hypertension), lowering high cholesterol, and managing diabetes. These may include: ? Reducing your daily calorie intake. ? Limiting your daily  sodium intake to 1,500 milligrams (mg). ? Using only healthy fats for cooking, such as olive oil, canola oil, or sunflower oil. ? Counting your daily carbohydrate intake. What lifestyle changes can be made?  Maintain a healthy weight. Talk to your health care provider about your ideal weight.  Get at least 30 minutes of moderate physical activity at least 5 days a week. Moderate activity includes brisk walking, biking, and swimming.  Do not use any products that contain nicotine or tobacco, such as cigarettes and e-cigarettes. If you need help quitting, ask your health care provider. It may also be helpful to avoid exposure to secondhand smoke.  Limit alcohol intake to no more than 1 drink a day for nonpregnant women and 2 drinks a day for men. One drink equals 12 oz of beer, 5 oz of wine, or 1 oz of hard liquor.  Stop any illegal drug use.  Avoid taking birth control pills. Talk to your health care provider about the risks of taking birth control pills if: ? You are over 8 years old. ? You smoke. ? You get migraines. ? You have ever had a blood clot. What other changes can be made?  Manage your cholesterol levels. ? Eating a healthy diet is important for preventing high cholesterol. If cholesterol cannot be managed through diet alone, you may also need to take medicines. ? Take any prescribed medicines to control your cholesterol as told by your health care provider.  Manage your diabetes. ? Eating a healthy diet and exercising regularly are important parts of managing your blood sugar.  If your blood sugar cannot be managed through diet and exercise, you may need to take medicines. ? Take any prescribed medicines to control your diabetes as told by your health care provider.  Control your hypertension. ? To reduce your risk of stroke, try to keep your blood pressure below 130/80. ? Eating a healthy diet and exercising regularly are an important part of controlling your blood  pressure. If your blood pressure cannot be managed through diet and exercise, you may need to take medicines. ? Take any prescribed medicines to control hypertension as told by your health care provider. ? Ask your health care provider if you should monitor your blood pressure at home. ? Have your blood pressure checked every year, even if your blood pressure is normal. Blood pressure increases with age and some medical conditions.  Get evaluated for sleep disorders (sleep apnea). Talk to your health care provider about getting a sleep evaluation if you snore a lot or have excessive sleepiness.  Take over-the-counter and prescription medicines only as told by your health care provider. Aspirin or blood thinners (antiplatelets or anticoagulants) may be recommended to reduce your risk of forming blood clots that can lead to stroke.  Make sure that any other medical conditions you have, such as atrial fibrillation or atherosclerosis, are managed. What are the warning signs of a stroke? The warning signs of a stroke can be easily remembered as BEFAST.  B is for balance. Signs include: ? Dizziness. ? Loss of balance or coordination. ? Sudden trouble walking.  E is for eyes. Signs include: ? A sudden change in vision. ? Trouble seeing.  F is for face. Signs include: ? Sudden weakness or numbness of the face. ? The face or eyelid drooping to one side.  A is for arms. Signs include: ? Sudden weakness or numbness of the arm, usually on one side of the body.  S is for speech. Signs include: ? Trouble speaking (aphasia). ? Trouble understanding.  T is for time. ? These symptoms may represent a serious problem that is an emergency. Do not wait to see if the symptoms will go away. Get medical help right away. Call your local emergency services (911 in the [place] Do not drive yourself to the hospital.  Other signs of stroke may include: ? A sudden, severe headache with no known  cause. ? Nausea or vomiting. ? Seizure. Where to find more information For more information, visit:  American Stroke Association: www.strokeassociation.org  National Stroke Association: www.stroke.org Summary  You can prevent a stroke by eating healthy, exercising, not smoking, limiting alcohol intake, and managing any medical conditions you may have.  Do not use any products that contain nicotine or tobacco, such as cigarettes and e-cigarettes. If you need help quitting, ask your health care provider. It may also be helpful to avoid exposure to secondhand smoke.  Remember BEFAST for warning signs of stroke. Get help right away if you or a loved one has any of these signs. This information is not intended to replace advice given to you by your health care provider. Make sure you discuss any questions you have with your health care provider. Document Revised: 08/13/2017 Document Reviewed: 10/06/2016 Elsevier Patient Education  2020 [employer].

## 2020-05-28 NOTE — Progress Notes (Signed)
I agree with the above plan 

## 2020-05-28 NOTE — Progress Notes (Signed)
[name] [address] [place]. [place] [address] [phone]       HOSPITAL FOLLOW UP NOTE  [name] Date of Birth:  1961/05/05 Medical Record Number:  [id number]   Reason for Referral:  hospital stroke follow up    SUBJECTIVE:   CHIEF COMPLAINT:  Chief Complaint  Patient presents with  . Follow-up    rm 9  . Cerebrovascular Accident    Pt said she has no sx at all    HPI:   [name] is a 59 y.o. female with history of HTN, HLD, Sjogren's disease, hypothyroidism who presented on 04/29/2020 with sudden onset of dizziness followed by numbness left leg and shortly after the left arm as well lasting approximately 1 hour.   Stroke work-up completed with possible right brain TIA vs anxiety (significant stress due to recent diagnosis of recurrent cancer in her husband).  Due to possible TIA, recommended aspirin and Plavix for 3 weeks followed by aspirin alone.  Hx of HTN stable.  LDL 110 initiate atorvastatin 40 mg daily.  No history or evidence of DM with A1c 5.6.  Other stroke risk factors include EtOH use but no prior stroke history.  Evaluated by therapy and was discharged home in stable condition without therapy needs.  R brain TIA versus anxiety  Code Stroke CT head No acute abnormality.  ASPECTS 10.     CTA head R V4 diminuative. L PCA high-grade P2/P3 jxn. B PCA atherosclerosis. Mild atherosclerosis B M2  CTA neck Unremarkable, streak artifact from recent OR cervical   MRI  / MRA  Unremarkable   2D Echo EF 60-65%. No source of embolus   LDL 110  HgbA1c 5.6  VTE prophylaxis - Lovenox 40 mg sq daily   No antithrombotic prior to admission, now on aspirin 325 mg daily. Decrease aspirin to 81 and add plavix 75 x 3 weeks then aspirin alone.   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 Cancer (HCC)    OVARIAN  . Colonic polyp   . Hyperlipidemia   . Hypertension   . Sjogren's disease ([place])   . Thyroid disease    HYPOTHYROIDISM    PSH:  Past Surgical History:  Procedure Laterality Date  . ABDOMINAL HYSTERECTOMY     Ovarian cancer  . BREAST CYST ASPIRATION    . CESAREAN SECTION     x2  . COLONOSCOPY  10-06   Dr. Earnest Bailey  . TONSILLECTOMY      Social History:  Social History   Socioeconomic History  . Marital status: Married    Spouse name: Not on file  . Number of children: 2  . Years of education: Not on file  . Highest education level: Not on file  Occupational History  . Not on file  Tobacco Use  . Smoking status: Never Smoker  . Smokeless tobacco: Never Used  Substance and Sexual Activity  . Alcohol use: Yes    Comment: 6 glasses per week  . Drug use: No  . Sexual activity: Not Currently  Other Topics Concern  . Not on file  Social History Narrative  . Not on file   Social Determinants of Health   Financial Resource Strain:   . Difficulty of Paying Living Expenses: Not on file  Food Insecurity:   . Worried About Charity fundraiser in the Last Year: Not on file  . Ran Out of Food in the Last Year: Not on file  Transportation Needs:   . Lack of Transportation (Medical): Not on file  . Lack of Transportation (Non-Medical): Not on file  Physical Activity:   . Days of Exercise per Week: Not on file  .  Minutes of Exercise per Session: Not on file  Stress:   . Feeling of Stress : Not on file  Social Connections:   . Frequency of Communication with Friends and Family: Not on file  . Frequency of Social Gatherings with Friends and Family: Not on file  . Attends Religious Services: Not on file  . Active Member of Clubs or Organizations: Not on file  . Attends Archivist Meetings: Not on file  . Marital Status: Not on file  Intimate Partner Violence:   . Fear of Current or Ex-Partner: Not on file  . Emotionally Abused: Not on file  . Physically Abused: Not on file  . Sexually Abused: Not on file    Family History:  Family History  Problem Relation Age of Onset  . Mental illness Mother   . Hypertension Mother   . Heart failure Mother   . Mental illness Sister   . Mental illness Brother   . Mental illness Maternal Uncle   . Mental illness Maternal Grandmother   . Cancer Paternal Grandmother   . CAD Father        CABG at age 20    Medications:   Current Outpatient Medications on File Prior to Visit  Medication Sig Dispense Refill  . aspirin EC 81 MG EC tablet Take 1 tablet (81 mg total) by mouth daily. Swallow whole. 30 tablet 0  . atorvastatin (LIPITOR) 40 MG tablet Take 1 tablet (40 mg total) by mouth daily. 30 tablet 0  . buPROPion (WELLBUTRIN XL) 300 MG 24 hr tablet Take 1 tablet (300 mg total) by mouth daily. 90 tablet 3  . estradiol (ESTRACE) 1 MG tablet Take 1 mg by mouth daily.    Marland Kitchen estrogens-methylTEST 1.25-2.5 MG TABS per tablet Take 1 tablet by mouth daily.    Marland Kitchen guanFACINE (TENEX) 2 MG tablet Take 2 mg by mouth in the morning and at bedtime.     . hydrochlorothiazide (HYDRODIURIL) 25 MG tablet TAKE 1 TABLET BY MOUTH ONCE DAILY (Patient taking differently: Take 25 mg by mouth daily. ) 90 tablet 2  . levothyroxine (SYNTHROID) 88 MCG tablet Take 88 mcg by mouth daily.    Marland Kitchen LORazepam (ATIVAN) 1 MG tablet Take 1 mg by mouth daily as needed for sleep.     .  methylphenidate (RITALIN) 20 MG tablet Take 1 tablet (20 mg total) by mouth 3 (three) times daily. 270 tablet 0  . MYRBETRIQ 50 MG TB24 tablet Take 50 mg by mouth daily.    . pilocarpine (SALAGEN) 5 MG tablet TAKE 1 TABLET BY MOUTH FOUR  TIMES DAILY 360 tablet 1   No current facility-administered medications on file prior to visit.    Allergies:  No Known Allergies    OBJECTIVE:  Physical Exam  Vitals:   05/28/20 0906  BP: 104/63  Pulse: 74  Weight: 131 lb (59.4 kg)  Height: 5\' 5"  (1.651 m)   Body mass index is 21.8 kg/m. No exam data present   General: well developed, well nourished, pleasant middle-age Caucasian female, seated, in no evident distress Head: head normocephalic and atraumatic.   Neck: supple with no carotid or supraclavicular bruits Cardiovascular: regular rate and rhythm, no murmurs Musculoskeletal: no deformity Skin:  no rash/petichiae Vascular:  Normal pulses all extremities   Neurologic Exam Mental Status: Awake and fully alert.   Fluent speech and language.  Oriented to place and time. Recent and remote memory intact. Attention span, concentration and fund of knowledge appropriate. Mood and affect appropriate.  Cranial Nerves: Fundoscopic exam reveals sharp disc margins. Pupils equal, briskly reactive to light. Extraocular movements full without nystagmus. Visual fields full to confrontation. Hearing intact. Facial sensation intact. Face, tongue, palate moves normally and symmetrically.  Motor: Normal bulk and tone. Normal strength in all tested extremity muscles. Sensory.: intact to touch , pinprick , position and vibratory sensation.  Coordination: Rapid alternating movements normal in all extremities. Finger-to-nose and heel-to-shin performed accurately bilaterally. Gait and Station: Arises from chair without difficulty. Stance is normal. Gait demonstrates normal stride length and balance Reflexes: 1+ and symmetric. Toes downgoing.     NIHSS   0 Modified Rankin  0     ASSESSMENT: Dorothy Wood is a 59 y.o. year old female presented with acute onset dizziness followed by left leg numbness followed by left arm numbness lasting approximately 1 hour on 04/29/2020 with stroke work-up largely unremarkable possibly right brain TIA vs anxiety (recent recurrent cancer diagnosis and husband).  Vascular risk factors include HTN, HLD and EtOH use.      PLAN:  1. TIA: No reoccurring or new symptoms. Continue aspirin 81 mg daily  and atorvastatin 40 mg daily for secondary stroke prevention. Close PCP follow up for aggressive stroke risk factor management  2. HTN: BP goal <130/90.  Stable.  Managed by PCP. 3. HLD: LDL goal <70. Recent LDL 110.  Initiated atorvastatin 40 mg daily during recent hospitalization.  F/u with PCP for management as well as prescribing of statin    Follow up in 6 months or call earlier if needed   I spent 45 minutes of face-to-face and non-face-to-face time with patient.  This included previsit chart review including recent hospitalization and pertinent progress notes, labs and imaging, lab review, study review, order entry, electronic health record documentation, patient education regarding recent TIA, importance of managing stroke risk factors and answered all questions to patient satisfaction     Frann Rider, [facility]  [facility] [address] [place], [place] [address]  Phone [phone] Fax [phone] Note: This document was prepared with digital dictation and possible smart phrase technology. Any transcriptional errors that result from this process are unintentional.

## 2020-06-03 ENCOUNTER — Other Ambulatory Visit (HOSPITAL_COMMUNITY): Payer: Self-pay | Admitting: Obstetrics & Gynecology

## 2020-06-03 MED FILL — ESTRADIOL 0.5 MG TABS: 0.5 | 30 days supply | Qty: 30 | Fill #0

## 2020-06-03 MED FILL — METHYLPHENIDATE HCL 20 MG T: 20 | 30 days supply | Qty: 90 | Fill #0

## 2020-06-04 ENCOUNTER — Encounter: Payer: Self-pay | Admitting: Adult Health

## 2020-06-04 MED FILL — MYRBETRIQ ER 50 MG TABLET: 50 | 30 days supply | Qty: 30 | Fill #0

## 2020-06-05 ENCOUNTER — Encounter: Payer: Self-pay | Admitting: Family Medicine

## 2020-06-05 ENCOUNTER — Other Ambulatory Visit: Payer: Self-pay | Admitting: Family Medicine

## 2020-06-05 ENCOUNTER — Encounter: Payer: Self-pay | Admitting: Adult Health

## 2020-06-05 MED FILL — ATORVASTATIN CALCIUM 40 MG: 40 | 30 days supply | Qty: 30 | Fill #0

## 2020-06-06 MED FILL — buPROPion HCL ER (XL) 300 M: 300 | 30 days supply | Qty: 30 | Fill #2

## 2020-06-17 ENCOUNTER — Other Ambulatory Visit: Payer: Self-pay | Admitting: Family Medicine

## 2020-06-17 DIAGNOSIS — M3500 Sicca syndrome, unspecified: Secondary | ICD-10-CM

## 2020-06-17 MED FILL — HYDROCHLOROTHIAZIDE 25 MG T: 25 | 90 days supply | Qty: 90 | Fill #2

## 2020-06-17 NOTE — Telephone Encounter (Signed)
wesly long to fill pt pilcarpin. Please advise [facility]

## 2020-06-18 ENCOUNTER — Other Ambulatory Visit: Payer: Self-pay | Admitting: Family Medicine

## 2020-06-18 MED ORDER — PILOCARPINE HCL 5 MG PO TABS: 5.0000 mg | ORAL_TABLET | Freq: Four times a day (QID) | ORAL | 1 refills | Status: DC

## 2020-06-18 MED FILL — PILOCARPINE HCL 5 MG TABLET: 5 | 90 days supply | Qty: 360 | Fill #0

## 2020-06-18 NOTE — Addendum Note (Signed)
Addended by: Denita Lung on: 06/18/2020 01:50 PM   Modules accepted: Orders

## 2020-06-21 ENCOUNTER — Other Ambulatory Visit (HOSPITAL_COMMUNITY): Payer: Self-pay | Admitting: Physician Assistant

## 2020-06-21 MED FILL — LORazepam 1 MG TABS: 1 | 30 days supply | Qty: 15 | Fill #0

## 2020-06-24 ENCOUNTER — Other Ambulatory Visit (HOSPITAL_COMMUNITY): Payer: Self-pay | Admitting: Physician Assistant

## 2020-06-24 DIAGNOSIS — E039 Hypothyroidism, unspecified: Secondary | ICD-10-CM | POA: Diagnosis not present

## 2020-06-24 DIAGNOSIS — H9313 Tinnitus, bilateral: Secondary | ICD-10-CM | POA: Diagnosis not present

## 2020-06-24 DIAGNOSIS — H903 Sensorineural hearing loss, bilateral: Secondary | ICD-10-CM | POA: Diagnosis not present

## 2020-06-24 MED FILL — guanFACINE HCL 2 MG TABS: 2 | 30 days supply | Qty: 60 | Fill #0

## 2020-06-27 ENCOUNTER — Other Ambulatory Visit (HOSPITAL_COMMUNITY): Payer: Self-pay | Admitting: Physician Assistant

## 2020-06-27 DIAGNOSIS — F419 Anxiety disorder, unspecified: Secondary | ICD-10-CM | POA: Diagnosis not present

## 2020-06-27 DIAGNOSIS — F902 Attention-deficit hyperactivity disorder, combined type: Secondary | ICD-10-CM | POA: Diagnosis not present

## 2020-06-27 MED FILL — ESTRADIOL 0.5 MG TABS: 0.5 | 30 days supply | Qty: 30 | Fill #1

## 2020-06-29 ENCOUNTER — Other Ambulatory Visit: Payer: Self-pay | Admitting: Family Medicine

## 2020-07-01 MED FILL — ATORVASTATIN 40 MG TABLET: 40 | 30 days supply | Qty: 30 | Fill #0

## 2020-07-03 DIAGNOSIS — H903 Sensorineural hearing loss, bilateral: Secondary | ICD-10-CM | POA: Diagnosis not present

## 2020-07-08 DIAGNOSIS — F902 Attention-deficit hyperactivity disorder, combined type: Secondary | ICD-10-CM | POA: Diagnosis not present

## 2020-07-08 DIAGNOSIS — F419 Anxiety disorder, unspecified: Secondary | ICD-10-CM | POA: Diagnosis not present

## 2020-07-19 ENCOUNTER — Other Ambulatory Visit (HOSPITAL_COMMUNITY): Payer: Self-pay | Admitting: Physician Assistant

## 2020-07-19 MED FILL — METHYLPHENIDATE HCL 20 MG T: 20 | 30 days supply | Qty: 90 | Fill #0

## 2020-07-19 MED FILL — MYRBETRIQ ER 50 MG TABLET: 50 | 30 days supply | Qty: 30 | Fill #1

## 2020-07-19 MED FILL — guanFACINE HCL 2 MG TABS: 2 | 30 days supply | Qty: 60 | Fill #0

## 2020-07-20 MED FILL — LORazepam 1 MG TABS: 1 | 15 days supply | Qty: 15 | Fill #0

## 2020-07-22 ENCOUNTER — Encounter: Payer: Self-pay | Admitting: Family Medicine

## 2020-07-22 ENCOUNTER — Other Ambulatory Visit: Payer: Self-pay

## 2020-07-22 ENCOUNTER — Ambulatory Visit (INDEPENDENT_AMBULATORY_CARE_PROVIDER_SITE_OTHER): Payer: 59 | Admitting: Family Medicine

## 2020-07-22 VITALS — BP 110/72 | HR 78 | Temp 98.3°F | Wt 131.0 lb

## 2020-07-22 DIAGNOSIS — Z79899 Other long term (current) drug therapy: Secondary | ICD-10-CM | POA: Diagnosis not present

## 2020-07-22 DIAGNOSIS — R0683 Snoring: Secondary | ICD-10-CM | POA: Diagnosis not present

## 2020-07-22 DIAGNOSIS — G459 Transient cerebral ischemic attack, unspecified: Secondary | ICD-10-CM | POA: Diagnosis not present

## 2020-07-22 NOTE — Progress Notes (Signed)
   Subjective:    Patient ID: Dorothy Wood, female    DOB: Mar 02, 1961, 59 y.o.   MRN: [id number]  HPI She is here for recheck.  She has been on Lipitor for the last several months and has had no difficulty with aches or pains or any other potential symptoms.   Review of Systems     Objective:   Physical Exam Alert and in no distress otherwise not examined       Assessment & Plan:  TIA (transient ischemic attack) - Plan: Lipid panel, Comprehensive metabolic panel  Encounter for long-term (current) use of medications - Plan: Lipid panel, Comprehensive metabolic panel

## 2020-07-23 LAB — COMPREHENSIVE METABOLIC PANEL
ALT: 56 IU/L — ABNORMAL HIGH (ref 0–32)
AST: 34 IU/L (ref 0–40)
Albumin/Globulin Ratio: 2.2 (ref 1.2–2.2)
Albumin: 4.2 g/dL (ref 3.8–4.9)
Alkaline Phosphatase: 99 IU/L (ref 44–121)
BUN/Creatinine Ratio: 18 (ref 9–23)
BUN: 14 mg/dL (ref 6–24)
Bilirubin Total: <0.2 mg/dL (ref 0.0–1.2)
CO2: 25 mmol/L (ref 20–29)
Calcium: 9.2 mg/dL (ref 8.7–10.2)
Chloride: 103 mmol/L (ref 96–106)
Creatinine, Ser: 0.8 mg/dL (ref 0.57–1.00)
GFR calc Af Amer: 93 mL/min/{1.73_m2} (ref >59)
GFR calc non Af Amer: 81 mL/min/{1.73_m2} (ref >59)
Globulin, Total: 1.9 g/dL (ref 1.5–4.5)
Glucose: 88 mg/dL (ref 65–99)
Potassium: 4.6 mmol/L (ref 3.5–5.2)
Sodium: 143 mmol/L (ref 134–144)
Total Protein: 6.1 g/dL (ref 6.0–8.5)

## 2020-07-23 LAB — LIPID PANEL
Chol/HDL Ratio: 2.6 ratio (ref 0.0–4.4)
Cholesterol, Total: 171 mg/dL (ref 100–199)
HDL: 65 mg/dL (ref >39)
LDL Chol Calc (NIH): 76 mg/dL (ref 0–99)
Triglycerides: 182 mg/dL — ABNORMAL HIGH (ref 0–149)
VLDL Cholesterol Cal: 30 mg/dL (ref 5–40)

## 2020-07-24 NOTE — Addendum Note (Signed)
Addended by: Denita Lung on: 07/24/2020 02:28 PM   Modules accepted: Orders

## 2020-07-25 ENCOUNTER — Other Ambulatory Visit: Payer: Self-pay | Admitting: Family Medicine

## 2020-07-25 MED FILL — ATORVASTATIN 40 MG TABLET: 40 | 30 days supply | Qty: 30 | Fill #0

## 2020-07-30 ENCOUNTER — Telehealth: Payer: Self-pay | Admitting: Family Medicine

## 2020-07-30 NOTE — Telephone Encounter (Signed)
Requested records received from [facility] °

## 2020-08-01 ENCOUNTER — Encounter: Payer: Self-pay | Admitting: Physical Therapy

## 2020-08-02 ENCOUNTER — Other Ambulatory Visit (HOSPITAL_COMMUNITY): Payer: Self-pay | Admitting: Physician Assistant

## 2020-08-03 MED FILL — buPROPion HCL ER (XL) 300 M: 300 | 30 days supply | Qty: 30 | Fill #0

## 2020-08-06 DIAGNOSIS — F419 Anxiety disorder, unspecified: Secondary | ICD-10-CM | POA: Diagnosis not present

## 2020-08-06 DIAGNOSIS — F902 Attention-deficit hyperactivity disorder, combined type: Secondary | ICD-10-CM | POA: Diagnosis not present

## 2020-08-13 MED FILL — guanFACINE HCL 2 MG TABS: 2 | 30 days supply | Qty: 60 | Fill #1

## 2020-08-16 MED FILL — LEVOTHYROXINE 88 MCG TABLET: 88 | 90 days supply | Qty: 90 | Fill #1

## 2020-08-19 ENCOUNTER — Other Ambulatory Visit: Payer: Self-pay | Admitting: Family Medicine

## 2020-08-19 MED FILL — ATORVASTATIN 40 MG TABLET: 40 | 90 days supply | Qty: 90 | Fill #0

## 2020-08-21 ENCOUNTER — Other Ambulatory Visit: Payer: Self-pay | Admitting: Family Medicine

## 2020-08-24 ENCOUNTER — Encounter: Payer: Self-pay | Admitting: Family Medicine

## 2020-08-24 DIAGNOSIS — S76311A Strain of muscle, fascia and tendon of the posterior muscle group at thigh level, right thigh, initial encounter: Secondary | ICD-10-CM

## 2020-08-27 MED FILL — METHYLPHENIDATE HCL 20 MG T: 20 | 30 days supply | Qty: 90 | Fill #0

## 2020-08-27 MED FILL — buPROPion HCL ER (XL) 300 M: 300 | 30 days supply | Qty: 30 | Fill #1

## 2020-08-28 MED FILL — MYRBETRIQ ER 50 MG TABLET: 50 | 30 days supply | Qty: 30 | Fill #2

## 2020-09-02 ENCOUNTER — Other Ambulatory Visit: Payer: Self-pay

## 2020-09-02 ENCOUNTER — Ambulatory Visit (HOSPITAL_BASED_OUTPATIENT_CLINIC_OR_DEPARTMENT_OTHER): Payer: 59 | Attending: Family Medicine | Admitting: Internal Medicine

## 2020-09-02 DIAGNOSIS — G4733 Obstructive sleep apnea (adult) (pediatric): Secondary | ICD-10-CM | POA: Insufficient documentation

## 2020-09-02 DIAGNOSIS — R0683 Snoring: Secondary | ICD-10-CM | POA: Diagnosis present

## 2020-09-03 DIAGNOSIS — F419 Anxiety disorder, unspecified: Secondary | ICD-10-CM | POA: Diagnosis not present

## 2020-09-03 DIAGNOSIS — F902 Attention-deficit hyperactivity disorder, combined type: Secondary | ICD-10-CM | POA: Diagnosis not present

## 2020-09-07 DIAGNOSIS — R0683 Snoring: Secondary | ICD-10-CM

## 2020-09-07 NOTE — Procedures (Signed)
   Patient Name: Dorothy Wood, Dorothy Wood Date: 09/02/2020 Gender: Female D.O.B: 03-Jul-1961 Age (years): 17 Referring Provider: Denita Lung Height (inches): 64 Interpreting Physician: Baird Lyons MD, ABSM Weight (lbs): 130 RPSGT: Jorge Ny BMI: 22 MRN: [id number] Neck Size: 13.00  CLINICAL INFORMATION Sleep Study Type: HST Indication for sleep study: OSA Epworth Sleepiness Score: 9  SLEEP STUDY TECHNIQUE A multi-channel overnight portable sleep study was performed. The channels recorded were: nasal airflow, thoracic respiratory movement, and oxygen saturation with a pulse oximetry. Snoring was also monitored.  MEDICATIONS Patient self administered medications include: none reported.  SLEEP ARCHITECTURE Patient was studied for 429.5 minutes. The sleep efficiency was 100.0 % and the patient was supine for 0%. The arousal index was 0.0 per hour.  RESPIRATORY PARAMETERS The overall AHI was 8.0 per hour, with a central apnea index of 0.0 per hour. The oxygen nadir was 92% during sleep.  CARDIAC DATA Mean heart rate during sleep was 71.8 bpm.  IMPRESSIONS - Mild obstructive sleep apnea occurred during this study (AHI = 8.0/h). - No significant central sleep apnea occurred during this study (CAI = 0.0/h). - The patient had minimal or no oxygen desaturation during the study (Min O2 = 92%) - Patient snored.  DIAGNOSIS - Obstructive Sleep Apnea (G47.33)  RECOMMENDATIONS - Treatment for mild OSA is directed at symptoms. Conservative measures might include observation, weight loss and sleep position off back.  - Other options, including CPAP, a fitted oral appliance or ENT evaluation, would be based on clinical judgment. - Be careful with alcohol, sedatives and other CNS depressants that may worsen sleep apnea and disrupt normal sleep architecture. - Sleep hygiene should be reviewed to assess factors that may improve sleep quality. - Weight management and regular  exercise should be initiated or continued.  [Electronically signed] 09/07/2020 02:12 PM  Baird Lyons MD, [place], American Board of Sleep Medicine   NPI: [id number]                          [place], [place] [facility]  ELECTRONICALLY SIGNED ON:  09/07/2020, 2:04 PM [place] PH: [phone]   FX: [phone] [place]

## 2020-09-10 ENCOUNTER — Encounter: Payer: Self-pay | Admitting: Internal Medicine

## 2020-09-10 DIAGNOSIS — G4733 Obstructive sleep apnea (adult) (pediatric): Secondary | ICD-10-CM | POA: Insufficient documentation

## 2020-09-10 MED FILL — PILOCARPINE HCL 5 MG TABLET: 5 | 90 days supply | Qty: 360 | Fill #1

## 2020-09-16 ENCOUNTER — Other Ambulatory Visit: Payer: Self-pay

## 2020-09-16 ENCOUNTER — Other Ambulatory Visit: Payer: Self-pay | Admitting: *Deleted

## 2020-09-16 ENCOUNTER — Ambulatory Visit: Payer: 59 | Admitting: Physical Therapy

## 2020-09-16 ENCOUNTER — Other Ambulatory Visit: Payer: Self-pay | Admitting: Cardiology

## 2020-09-16 ENCOUNTER — Encounter: Payer: Self-pay | Admitting: Physical Therapy

## 2020-09-16 DIAGNOSIS — I10 Essential (primary) hypertension: Secondary | ICD-10-CM

## 2020-09-16 DIAGNOSIS — M6281 Muscle weakness (generalized): Secondary | ICD-10-CM | POA: Diagnosis not present

## 2020-09-16 DIAGNOSIS — M79604 Pain in right leg: Secondary | ICD-10-CM

## 2020-09-16 DIAGNOSIS — R29898 Other symptoms and signs involving the musculoskeletal system: Secondary | ICD-10-CM

## 2020-09-16 MED ORDER — HYDROCHLOROTHIAZIDE 25 MG PO TABS: 25.0000 mg | ORAL_TABLET | Freq: Every day | ORAL | 2 refills | Status: DC

## 2020-09-16 MED FILL — HYDROCHLOROTHIAZIDE 25 MG T: 25 | 90 days supply | Qty: 90 | Fill #0

## 2020-09-16 NOTE — Therapy (Signed)
[facility] [address] [place], [place], [address] Phone: [phone]   Fax:  [phone]  Physical Therapy Evaluation  Patient Details  Name: Dorothy Wood MRN: [id number] Date of Birth: Mar 20, 1961 Referring Provider (PT): Denita Lung, MD   Encounter Date: 09/16/2020   PT End of Session - 09/16/20 1420    Visit Number 1    Number of Visits 6    Date for PT Re-Evaluation 12/09/20    Authorization Type [facility]    PT Start Time 1345    PT Stop Time 1420    PT Time Calculation (min) 35 min    Activity Tolerance Patient tolerated treatment well    Behavior During Therapy [facility] for tasks assessed/performed           Past Medical History:  Diagnosis Date  . Cancer (HCC)    OVARIAN  . Colonic polyp   . Hyperlipidemia   . Hypertension   . Sjogren's disease ([place])   . Thyroid disease    HYPOTHYROIDISM    Past Surgical History:  Procedure Laterality Date  . ABDOMINAL HYSTERECTOMY     Ovarian cancer  . BREAST CYST ASPIRATION    . CESAREAN SECTION     x2  . COLONOSCOPY  10-06   Dr. Earnest Bailey  . TONSILLECTOMY      There were no vitals filed for this visit.    Subjective Assessment - 09/16/20 1346    Subjective Pt is a 60 y/o female who presents to OPPT for chronic Rt hamstring strain.  She was seen at this clinic about 10 months ago for similar condition.  She reports any activity outside of walking will strain her hamstring.  Since PT, she has also had a small CVA with no residual deficits.    Patient Stated Goals improve pain and symptoms    Currently in Pain? Yes    Pain Score 0-No pain   up to 5/10   Pain Location Leg    Pain Orientation Right;Posterior;Upper    Pain Descriptors / Indicators Cramping;Spasm;Squeezing;Tightness    Pain Type Chronic pain    Pain Onset More than a month ago    Pain Frequency Intermittent    Aggravating Factors  reaching up on tip toes, running    Pain Relieving Factors avoiding these  factors              [facility] PT Assessment - 09/16/20 1350      Assessment   Medical Diagnosis S76.311A (ICD-10-CM) - Hamstring strain, right, initial encounter    Referring Provider (PT) Denita Lung, MD    Onset Date/Surgical Date --   Spring 2021   Hand Dominance Right    Next MD Visit PRN    Prior Therapy at this clinic for multiple concerns      Precautions   Precautions None      Restrictions   Weight Bearing Restrictions No      Balance Screen   Has the patient fallen in the past 6 months No    Has the patient had a decrease in activity level because of a fear of falling?  No    Is the patient reluctant to leave their home because of a fear of falling?  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, working remotely      Cognition   Overall Cognitive Status Within Functional Limits for tasks assessed      Observation/Other Assessments   Focus on Therapeutic Outcomes (FOTO)  56 (predicted 64)      ROM / Strength   AROM / PROM / Strength Strength;AROM      AROM   Overall AROM Comments bil hip/knees WNL      Strength   Strength Assessment Site Knee    Right/Left Knee Right;Left    Right Knee Flexion 3+/5   with pain   Right Knee Extension 4/5    Left Knee Flexion 5/5    Left Knee Extension 5/5      Flexibility   Soft Tissue Assessment /Muscle Length yes    Hamstrings tightness Rt >Lt      Palpation   Palpation comment active trigger points noted in Rt medial hamstring      Special Tests    Special Tests Lumbar    Lumbar Tests Slump Test      Slump test   Findings Negative    Side Right      Ambulation/Gait   Gait Comments WNL without deviations                      Objective measurements completed on examination: See  above findings.       [facility] Adult PT Treatment/Exercise - 09/16/20 1350      Exercises   Exercises Other Exercises    Other Exercises  see pt instructions - performed 1-5 reps of each exercise      Manual Therapy   Manual Therapy Soft tissue mobilization    Manual therapy comments skilled palpation and monitoring of soft tissue during DN    Soft tissue mobilization STM and compression to Rt hamstrings            Trigger Point Dry Needling - 09/16/20 0001    Consent Given? Yes    Education Handout Provided Previously provided    Muscles Treated Lower Quadrant Hamstring    Electrical Stimulation Performed with Dry Needling Yes    E-stim with Dry Needling Details to tolerance x 5 min    Hamstring Response Twitch response elicited                PT Education - 09/16/20 1418    Education Details HEP    Person(s) Educated Patient    Methods Explanation;Demonstration;Handout    Comprehension Verbalized understanding;Returned demonstration            PT Short Term Goals - 09/16/20 1521      PT SHORT TERM GOAL #1   Title report 50% decrease in symptoms for improved function    Status New    Target Date 10/14/20      PT SHORT TERM GOAL #2   Title independent with initial HEP    Status New    Target Date 10/28/20             PT Long Term Goals - 09/16/20 1522      PT LONG TERM GOAL #1   Title independent with HEP    Status New    Target Date 12/09/20      PT LONG TERM GOAL #2   Title report decrease in pain by 75% for improved function and decreased work interruptions    Status New    Target Date 12/09/20  PT LONG TERM GOAL #3   Title demonstrate 5/5 Rt knee flexion strength for improved function    Status New    Target Date 12/09/20      PT LONG TERM GOAL #4   Title FOTO score improved to 64 for improved function    Status New    Target Date 12/09/20                  Plan - 09/16/20 1420    Clinical Impression Statement Pt is a  60 y/o female who presents to OPPT for chronic Rt hamstring strain.  She demonstrates active trigger points, decreased strength and flexibility affecting functional mobility. Pt will benefit from PT to address deficits listed.  Treated today with manual/DN and estim with reduction in symptoms reported following session.    Personal Factors and Comorbidities Comorbidity 3+    Comorbidities hx hamstring tear, ovarian cancer, CVA, HTN, sjogren's syndrome    Examination-Activity Limitations Locomotion Level;Squat;Stairs;Stand;Lift    Examination-Participation Restrictions Cleaning;Occupation;Laundry;Community Activity    Stability/Clinical Decision Making Stable/Uncomplicated    Clinical Decision Making Low    Rehab Potential Good    PT Frequency Biweekly    PT Duration 12 weeks    PT Treatment/Interventions ADLs/Self Care Home Management;Cryotherapy;Electrical Stimulation;Ultrasound;Moist Heat;Iontophoresis 4mg /ml Dexamethasone;Functional mobility training;Therapeutic activities;Therapeutic exercise;Balance training;Patient/family education;Neuromuscular re-education;Manual techniques;Taping;Dry needling    PT Next Visit Plan review HEP, assess response to DN, focus on hamstring strengthening    PT Home Exercise Plan Access Code:    Consulted and Agree with Plan of Care Patient           Patient will benefit from skilled therapeutic intervention in order to improve the following deficits and impairments:  Increased fascial restricitons,Increased muscle spasms,Pain,Decreased mobility,Decreased strength  Visit Diagnosis: Pain in right leg - Plan: PT plan of care cert/re-cert  Other symptoms and signs involving the musculoskeletal system - Plan: PT plan of care cert/re-cert  Muscle weakness (generalized) - Plan: PT plan of care cert/re-cert     Problem List Patient Active Problem List   Diagnosis Date Noted  . OSA (obstructive sleep apnea) 09/10/2020  . TIA (transient ischemic  attack) 04/29/2020  . Essential hypertension 04/29/2020  . Hormone replacement therapy (HRT) 09/09/2016  . Hypothyroidism 07/16/2014  . Sjogren's syndrome (HCC) 04/11/2013  . History of ovarian cancer 04/11/2013  . Personal history of colonic polyps 04/11/2013  . ADD (attention deficit disorder) 07/06/2012  . Dysthymia 07/06/2012      07/08/2012, PT, DPT 09/16/20 3:27 PM    [facility] [address] [place], [place], [place] Phone: [phone]   Fax:  [phone]  Name: Dorothy Wood MRN: [name] Date of Birth: November 26, 1960

## 2020-09-16 NOTE — Patient Instructions (Signed)
Access Code: [email or web address] URL: [email or web address] Date: 09/16/2020 Prepared by: Moshe Cipro  Exercises Supine Hamstring Stretch with Strap - 2 x daily - 7 x weekly - 3 reps - 1 sets - 30 sec hold Ankle Slide/Glide - 2 x daily - 7 x weekly - 10 reps - 1 sets - 5-10 sec hold Forward T - 1 x daily - 7 x weekly - 3 sets - 10 reps Figure 4 Bridge - 1 x daily - 7 x weekly - 3 sets - 10 reps

## 2020-09-26 MED FILL — buPROPion HCL ER (XL) 300 M: 300 | 30 days supply | Qty: 30 | Fill #2

## 2020-09-30 ENCOUNTER — Other Ambulatory Visit (HOSPITAL_COMMUNITY): Payer: Self-pay | Admitting: Physician Assistant

## 2020-09-30 DIAGNOSIS — F419 Anxiety disorder, unspecified: Secondary | ICD-10-CM | POA: Diagnosis not present

## 2020-09-30 DIAGNOSIS — F902 Attention-deficit hyperactivity disorder, combined type: Secondary | ICD-10-CM | POA: Diagnosis not present

## 2020-09-30 MED FILL — MYRBETRIQ ER 50 MG TABLET: 50 | 30 days supply | Qty: 30 | Fill #3

## 2020-10-01 ENCOUNTER — Encounter: Payer: Self-pay | Admitting: Physical Therapy

## 2020-10-01 MED FILL — METHYLPHENIDATE HCL 20 MG T: 20 | 30 days supply | Qty: 90 | Fill #0

## 2020-10-02 ENCOUNTER — Other Ambulatory Visit: Payer: Self-pay | Admitting: Obstetrics & Gynecology

## 2020-10-02 DIAGNOSIS — Z1231 Encounter for screening mammogram for malignant neoplasm of breast: Secondary | ICD-10-CM

## 2020-10-07 ENCOUNTER — Other Ambulatory Visit: Payer: Self-pay

## 2020-10-07 ENCOUNTER — Emergency Department (HOSPITAL_COMMUNITY): Payer: 59

## 2020-10-07 ENCOUNTER — Encounter (HOSPITAL_COMMUNITY): Payer: Self-pay | Admitting: Obstetrics and Gynecology

## 2020-10-07 ENCOUNTER — Emergency Department (HOSPITAL_COMMUNITY)
Admission: EM | Admit: 2020-10-07 | Discharge: 2020-10-08 | Disposition: A | Discharge status: Home / Self Care | Payer: 59 | Attending: Emergency Medicine | Admitting: Emergency Medicine

## 2020-10-07 DIAGNOSIS — R112 Nausea with vomiting, unspecified: Secondary | ICD-10-CM | POA: Diagnosis not present

## 2020-10-07 DIAGNOSIS — R0789 Other chest pain: Secondary | ICD-10-CM

## 2020-10-07 DIAGNOSIS — R110 Nausea: Secondary | ICD-10-CM | POA: Diagnosis not present

## 2020-10-07 DIAGNOSIS — Z79899 Other long term (current) drug therapy: Secondary | ICD-10-CM | POA: Insufficient documentation

## 2020-10-07 DIAGNOSIS — Z8673 Personal history of transient ischemic attack (TIA), and cerebral infarction without residual deficits: Secondary | ICD-10-CM | POA: Insufficient documentation

## 2020-10-07 DIAGNOSIS — I10 Essential (primary) hypertension: Secondary | ICD-10-CM | POA: Diagnosis not present

## 2020-10-07 DIAGNOSIS — E039 Hypothyroidism, unspecified: Secondary | ICD-10-CM | POA: Diagnosis not present

## 2020-10-07 DIAGNOSIS — Z8543 Personal history of malignant neoplasm of ovary: Secondary | ICD-10-CM | POA: Diagnosis not present

## 2020-10-07 DIAGNOSIS — R079 Chest pain, unspecified: Secondary | ICD-10-CM | POA: Diagnosis not present

## 2020-10-07 DIAGNOSIS — Z90711 Acquired absence of uterus with remaining cervical stump: Secondary | ICD-10-CM | POA: Diagnosis not present

## 2020-10-07 DIAGNOSIS — R1110 Vomiting, unspecified: Secondary | ICD-10-CM | POA: Insufficient documentation

## 2020-10-07 DIAGNOSIS — R109 Unspecified abdominal pain: Secondary | ICD-10-CM | POA: Diagnosis not present

## 2020-10-07 DIAGNOSIS — R1013 Epigastric pain: Secondary | ICD-10-CM | POA: Insufficient documentation

## 2020-10-07 LAB — CBC
HCT: 41.5 % (ref 36.0–46.0)
Hemoglobin: 13.8 g/dL (ref 12.0–15.0)
MCH: 30.7 pg (ref 26.0–34.0)
MCHC: 33.3 g/dL (ref 30.0–36.0)
MCV: 92.4 fL (ref 80.0–100.0)
Platelets: 299 10*3/uL (ref 150–400)
RBC: 4.49 MIL/uL (ref 3.87–5.11)
RDW: 12.4 % (ref 11.5–15.5)
WBC: 11.6 10*3/uL — ABNORMAL HIGH (ref 4.0–10.5)
nRBC: 0 % (ref 0.0–0.2)

## 2020-10-07 LAB — COMPREHENSIVE METABOLIC PANEL
ALT: 49 U/L — ABNORMAL HIGH (ref 0–44)
AST: 31 U/L (ref 15–41)
Albumin: 4.3 g/dL (ref 3.5–5.0)
Alkaline Phosphatase: 71 U/L (ref 38–126)
Anion gap: 15 (ref 5–15)
BUN: 14 mg/dL (ref 6–20)
CO2: 23 mmol/L (ref 22–32)
Calcium: 9.5 mg/dL (ref 8.9–10.3)
Chloride: 101 mmol/L (ref 98–111)
Creatinine, Ser: 0.87 mg/dL (ref 0.44–1.00)
GFR, Estimated: >60 mL/min (ref >60)
Glucose, Bld: 129 mg/dL — ABNORMAL HIGH (ref 70–99)
Potassium: 4 mmol/L (ref 3.5–5.1)
Sodium: 139 mmol/L (ref 135–145)
Total Bilirubin: 0.5 mg/dL (ref 0.3–1.2)
Total Protein: 6.9 g/dL (ref 6.5–8.1)

## 2020-10-07 LAB — TROPONIN I (HIGH SENSITIVITY): Troponin I (High Sensitivity): 2 ng/L (ref <18)

## 2020-10-07 LAB — LIPASE, BLOOD: Lipase: 23 U/L (ref 11–51)

## 2020-10-07 LAB — D-DIMER, QUANTITATIVE: D-Dimer, Quant: <0.27 ug/mL-FEU (ref 0.00–0.50)

## 2020-10-07 IMAGING — CR DG CHEST 2V
2 series · 2 of 2 positions shown · non-contrast
Comparison: None.

CLINICAL DATA: Chest pain, nausea, vomiting, body aches for 3 days.

EXAM:
CHEST - 2 VIEW

[w chest pa]
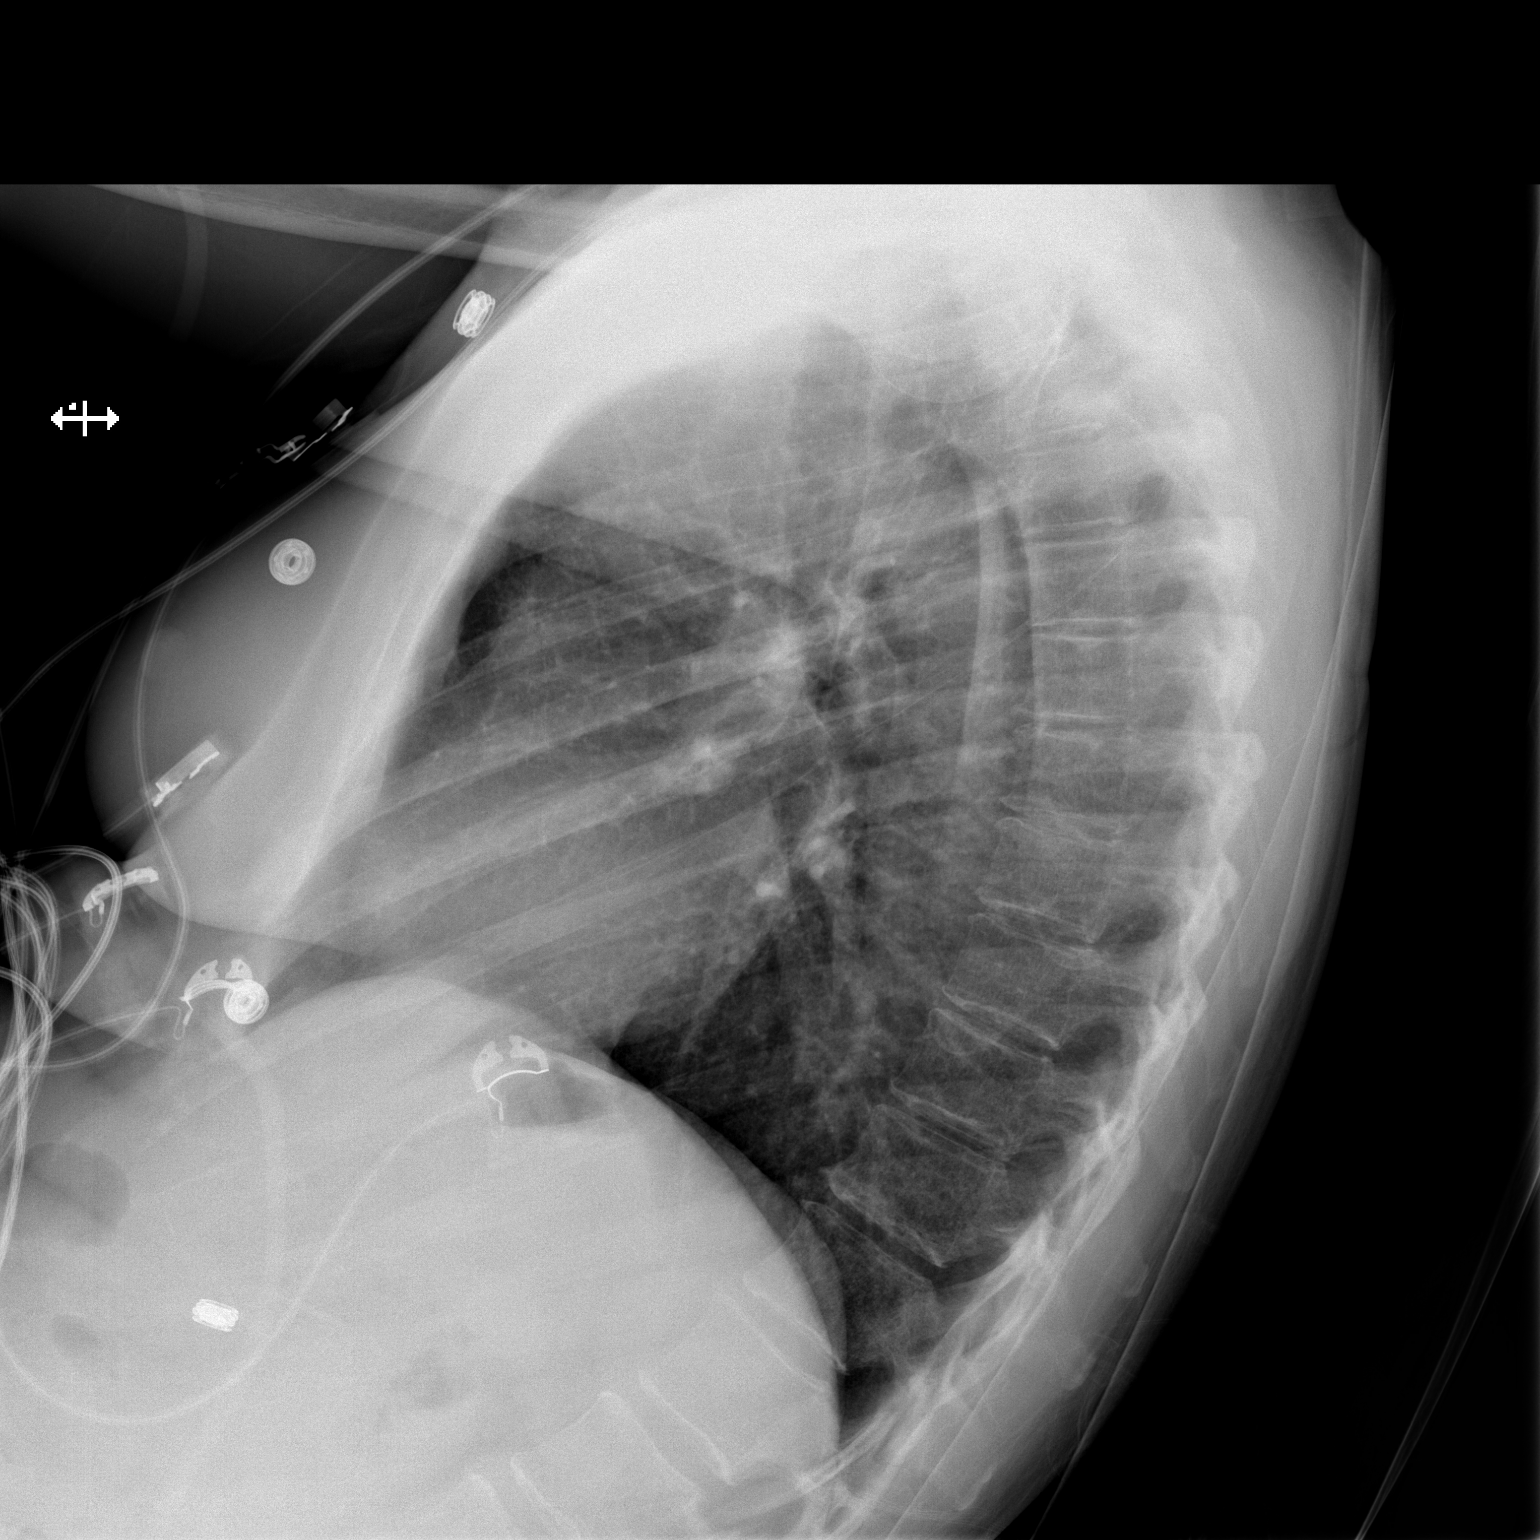

[x chest ap]
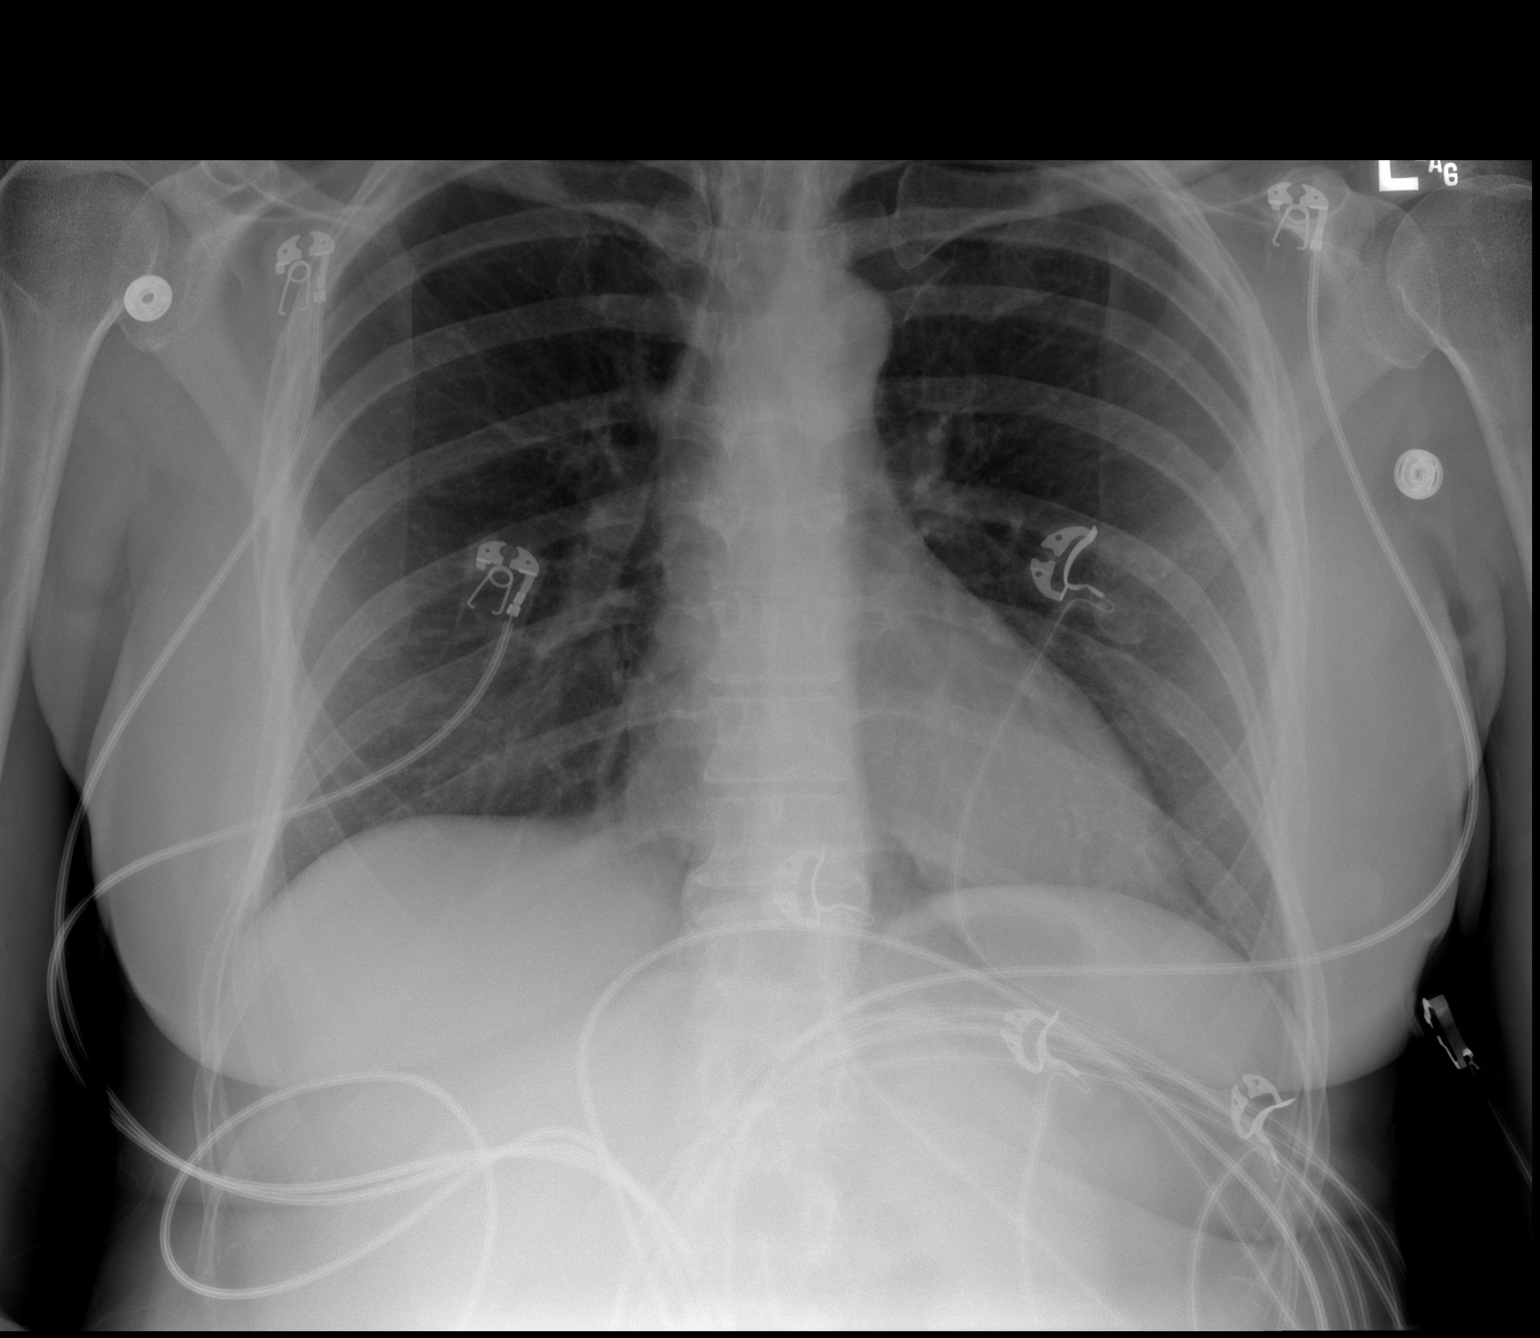

[2 of 2 positions shown; findings below may reference images not displayed]

FINDINGS: The heart size and mediastinal contours are within normal limits.
Both lungs are clear. The visualized skeletal structures are
unremarkable.
IMPRESSION: No active cardiopulmonary disease.

## 2020-10-07 MED ORDER — PANTOPRAZOLE SODIUM 40 MG IV SOLR
40.0000 mg | Freq: Once | INTRAVENOUS | Status: AC
  Administered 2020-10-07: 40 mg via INTRAVENOUS
  Filled 2020-10-07: qty 40

## 2020-10-07 MED ORDER — FENTANYL CITRATE (PF) 100 MCG/2ML IJ SOLN
50.0000 ug | INTRAMUSCULAR | Status: DC | PRN
  Administered 2020-10-07: 50 ug via INTRAVENOUS
  Filled 2020-10-07: qty 2

## 2020-10-07 MED ORDER — ONDANSETRON HCL 4 MG/2ML IJ SOLN
4.0000 mg | Freq: Once | INTRAMUSCULAR | Status: AC
  Administered 2020-10-07: 4 mg via INTRAVENOUS
  Filled 2020-10-07: qty 2

## 2020-10-07 MED ORDER — MORPHINE SULFATE (PF) 4 MG/ML IV SOLN
4.0000 mg | Freq: Once | INTRAVENOUS | Status: AC
  Administered 2020-10-07: 4 mg via INTRAVENOUS
  Filled 2020-10-07: qty 1

## 2020-10-07 MED ORDER — SODIUM CHLORIDE 0.9 % IV BOLUS
1000.0000 mL | Freq: Once | INTRAVENOUS | Status: AC
  Administered 2020-10-07: 1000 mL via INTRAVENOUS

## 2020-10-07 NOTE — ED Notes (Signed)
Pt is now calm and sleeping at bedside. Daughter is with pt for support. 2L of O2 [place] applied for decreased o2 when sleeping pt tolerating well. Remains on monitor x4, Awaiting CT.

## 2020-10-07 NOTE — ED Triage Notes (Signed)
Patient reports to the ER with chest pain, emesis, and abdominal pain. Patient reports a negative at home COVID test. Patient reports she has a stricture in her abdomen and is unsure if this has become a bowel blockage.

## 2020-10-07 NOTE — Progress Notes (Deleted)
HPI: FU hypertension and family h/o CAD. Calcium score February 2021 0.  Echocardiogram August 2021 showed normal LV function.  Brain MRI/MRA August 2021 normal.  Since last seen,   Current Outpatient Medications  Medication Sig Dispense Refill  . atorvastatin (LIPITOR) 40 MG tablet TAKE 1 TABLET BY MOUTH ONCE DAILY 90 tablet 1  . buPROPion (WELLBUTRIN XL) 300 MG 24 hr tablet Take 1 tablet (300 mg total) by mouth daily. 90 tablet 3  . estradiol (ESTRACE) 1 MG tablet Take 0.5 mg by mouth daily.  (Patient not taking: Reported on 09/16/2020)    . guanFACINE (TENEX) 2 MG tablet Take 2 mg by mouth in the morning and at bedtime.     . hydrochlorothiazide (HYDRODIURIL) 25 MG tablet Take 1 tablet (25 mg total) by mouth daily. 90 tablet 2  . levothyroxine (SYNTHROID) 88 MCG tablet Take 88 mcg by mouth daily.    Marland Kitchen LORazepam (ATIVAN) 1 MG tablet Take 1 mg by mouth daily as needed for sleep.     . methylphenidate (RITALIN) 20 MG tablet Take 1 tablet (20 mg total) by mouth 3 (three) times daily. 270 tablet 0  . MYRBETRIQ 50 MG TB24 tablet Take 50 mg by mouth daily.    . pilocarpine (SALAGEN) 5 MG tablet Take 1 tablet (5 mg total) by mouth 4 (four) times daily. 360 tablet 1   No current facility-administered medications for this visit.     Past Medical History:  Diagnosis Date  . Cancer (HCC)    OVARIAN  . Colonic polyp   . Hyperlipidemia   . Hypertension   . Sjogren's disease ([place])   . Thyroid disease    HYPOTHYROIDISM    Past Surgical History:  Procedure Laterality Date  . ABDOMINAL HYSTERECTOMY     Ovarian cancer  . BREAST CYST ASPIRATION    . CESAREAN SECTION     x2  . COLONOSCOPY  10-06   Dr. Earnest Bailey  . TONSILLECTOMY      Social History   Socioeconomic History  . Marital status: Married    Spouse name: Not on file  . Number of children: 2  . Years of education: Not on file  . Highest education level: Not on file  Occupational History  . Not on file  Tobacco Use   . Smoking status: Never Smoker  . Smokeless tobacco: Never Used  Substance and Sexual Activity  . Alcohol use: Yes    Comment: 6 glasses per week  . Drug use: No  . Sexual activity: Not Currently  Other Topics Concern  . Not on file  Social History Narrative  . Not on file   Social Determinants of Health   Financial Resource Strain: Not on file  Food Insecurity: Not on file  Transportation Needs: Not on file  Physical Activity: Not on file  Stress: Not on file  Social Connections: Not on file  Intimate Partner Violence: Not on file    Family History  Problem Relation Age of Onset  . Mental illness Mother   . Hypertension Mother   . Heart failure Mother   . Mental illness Sister   . Mental illness Brother   . Mental illness Maternal Uncle   . Mental illness Maternal Grandmother   . Cancer Paternal Grandmother   . CAD Father        CABG at age 25    ROS: no fevers or chills, productive cough, hemoptysis, dysphasia, odynophagia, melena, hematochezia, dysuria, hematuria,  rash, seizure activity, orthopnea, PND, pedal edema, claudication. Remaining systems are negative.  Physical Exam: Well-developed well-nourished in no acute distress.  Skin is warm and dry.  HEENT is normal.  Neck is supple.  Chest is clear to auscultation with normal expansion.  Cardiovascular exam is regular rate and rhythm.  Abdominal exam nontender or distended. No masses palpated. Extremities show no edema. neuro grossly intact  ECG- personally reviewed  A/P  1 hypertension-patient's blood pressure is controlled.  Continue present medications and follow.  2 hyperlipidemia-most recent laboratories from November 2021 reviewed and shows LDL 76.  Continue statin.  3 family history of coronary artery disease-previous calcium score 0.  No chest pain.  Continue lifestyle modification.  Kirk Ruths, MD

## 2020-10-08 ENCOUNTER — Encounter (HOSPITAL_COMMUNITY): Payer: Self-pay

## 2020-10-08 ENCOUNTER — Emergency Department (HOSPITAL_COMMUNITY): Payer: 59

## 2020-10-08 ENCOUNTER — Other Ambulatory Visit (HOSPITAL_COMMUNITY): Payer: Self-pay | Admitting: Emergency Medicine

## 2020-10-08 DIAGNOSIS — I10 Essential (primary) hypertension: Secondary | ICD-10-CM | POA: Diagnosis not present

## 2020-10-08 DIAGNOSIS — R1110 Vomiting, unspecified: Secondary | ICD-10-CM | POA: Diagnosis not present

## 2020-10-08 DIAGNOSIS — Z8543 Personal history of malignant neoplasm of ovary: Secondary | ICD-10-CM | POA: Diagnosis not present

## 2020-10-08 DIAGNOSIS — Z90711 Acquired absence of uterus with remaining cervical stump: Secondary | ICD-10-CM | POA: Diagnosis not present

## 2020-10-08 DIAGNOSIS — E039 Hypothyroidism, unspecified: Secondary | ICD-10-CM | POA: Diagnosis not present

## 2020-10-08 DIAGNOSIS — Z79899 Other long term (current) drug therapy: Secondary | ICD-10-CM | POA: Diagnosis not present

## 2020-10-08 DIAGNOSIS — R079 Chest pain, unspecified: Secondary | ICD-10-CM | POA: Diagnosis not present

## 2020-10-08 DIAGNOSIS — R109 Unspecified abdominal pain: Secondary | ICD-10-CM | POA: Diagnosis not present

## 2020-10-08 DIAGNOSIS — Z8673 Personal history of transient ischemic attack (TIA), and cerebral infarction without residual deficits: Secondary | ICD-10-CM | POA: Diagnosis not present

## 2020-10-08 DIAGNOSIS — R1013 Epigastric pain: Secondary | ICD-10-CM | POA: Diagnosis not present

## 2020-10-08 LAB — TROPONIN I (HIGH SENSITIVITY): Troponin I (High Sensitivity): <2 ng/L (ref <18)

## 2020-10-08 IMAGING — CT CT ANGIO CHEST
2 of 7 series · 18 of 46 positions shown · IV contrast (omnipaque)
Comparison: None.

CLINICAL DATA: Chest pain, vomiting, unspecified abdominal pain

EXAM:
CT ANGIOGRAPHY CHEST
CT ABDOMEN AND PELVIS WITH CONTRAST
TECHNIQUE: Multidetector CT imaging of the chest was performed using the
standard protocol during bolus administration of intravenous
contrast. Multiplanar CT image reconstructions and MIPs were
obtained to evaluate the vascular anatomy. Multidetector CT imaging
of the abdomen and pelvis was performed using the standard protocol
during bolus administration of intravenous contrast.
CONTRAST:  100mL OMNIPAQUE IOHEXOL 350 MG/ML SOLN

[Series 6: thins · axial · 0.67mm/px · z∈[+1297,+1570]mm · 15 of 303 slices shown]
[im 15/303  lung]
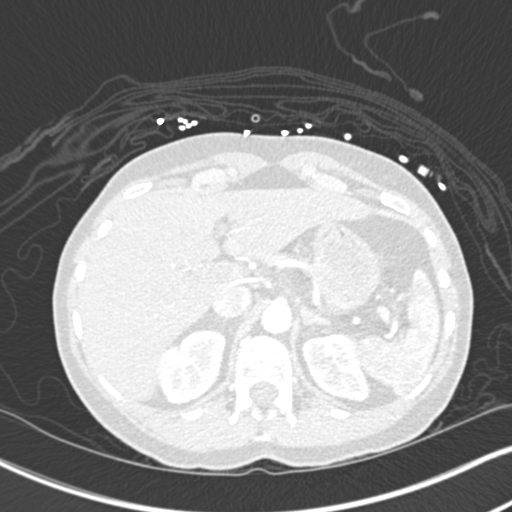
[im 44/303  soft-tissue]
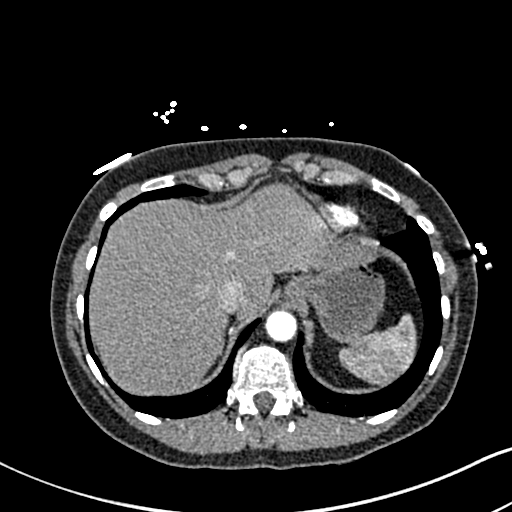
[im 58/303  lung]
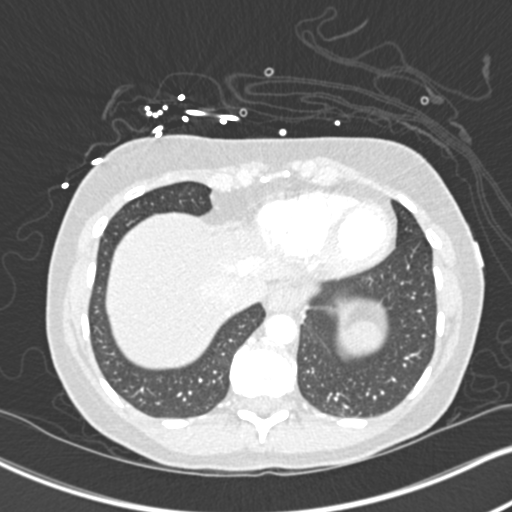
[im 72/303  soft-tissue]
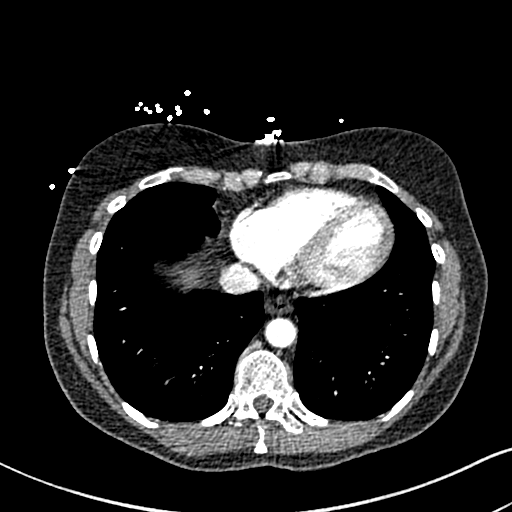
[im 101/303  lung]
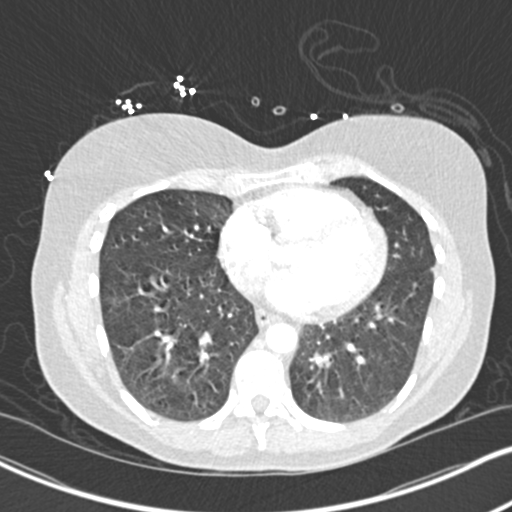
[im 116/303  soft-tissue]
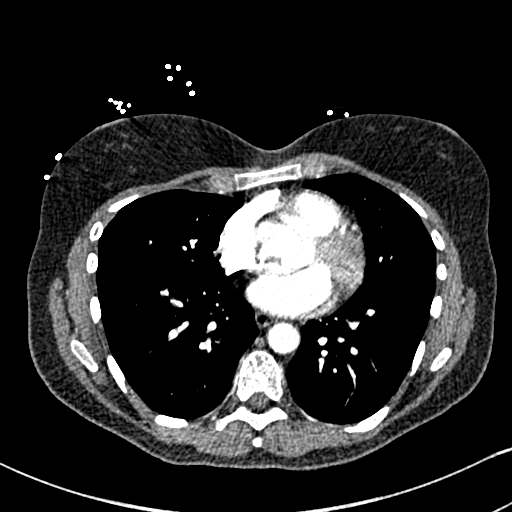
[im 130/303  lung]
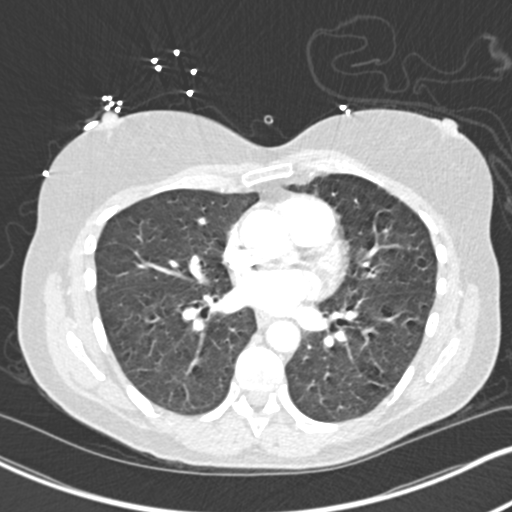
[im 159/303  soft-tissue]
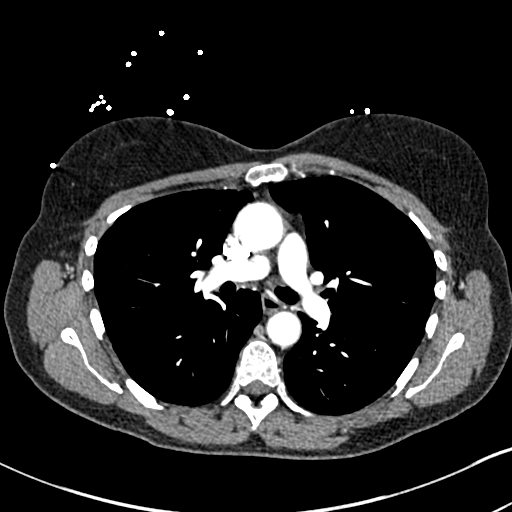
[im 173/303  lung]
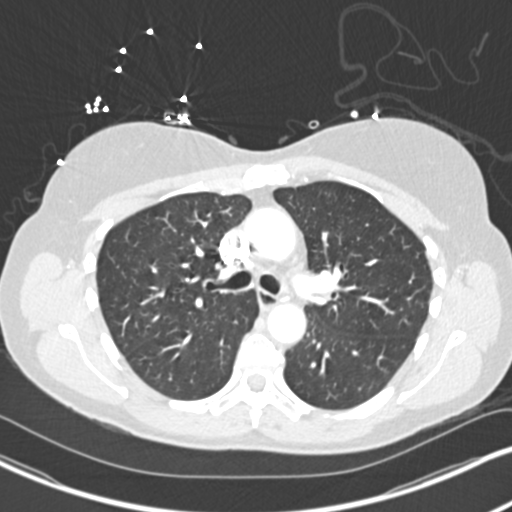
[im 187/303  soft-tissue]
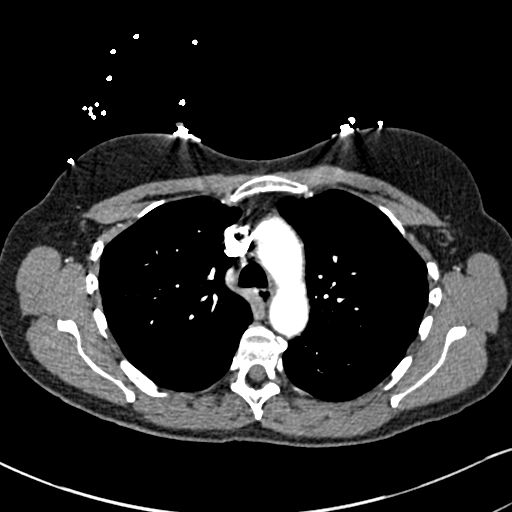
[im 216/303  lung]
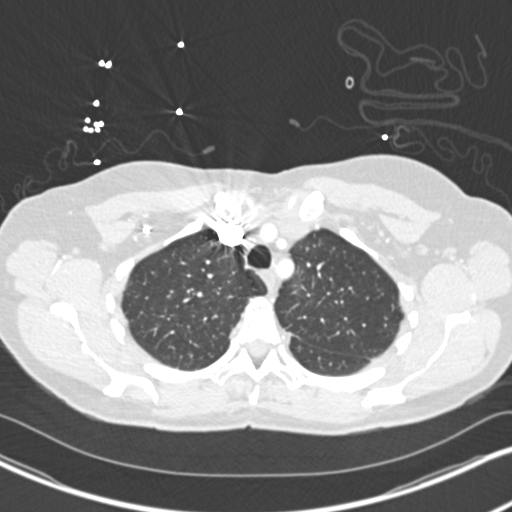
[im 231/303  soft-tissue]
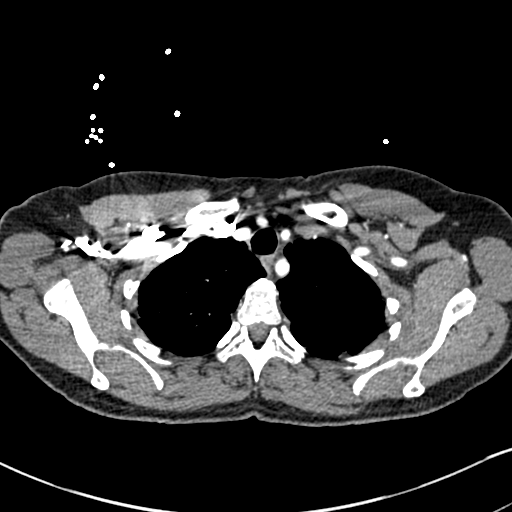
[im 245/303  lung]
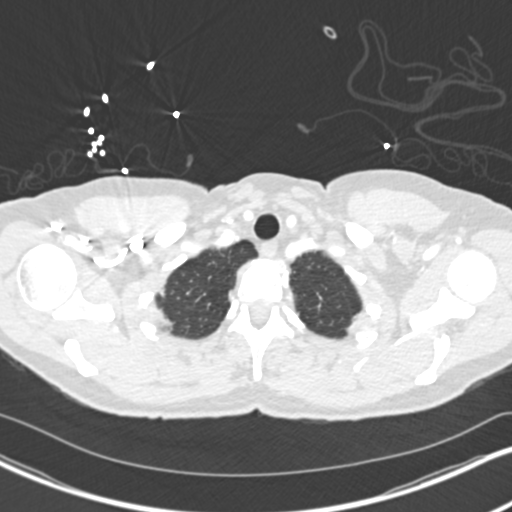
[im 274/303  soft-tissue]
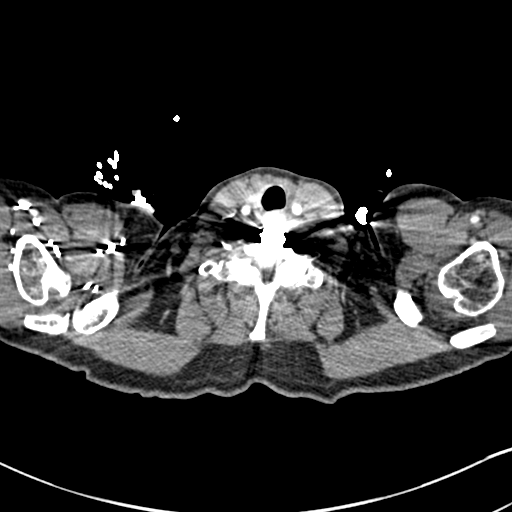
[im 288/303  lung]
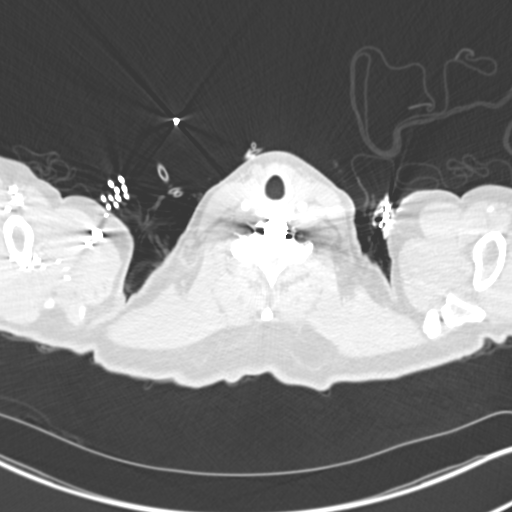

[Series 8: coronal mpr · coronal · 0.60mm/px · 3 of 117 slices shown]
[im 30/117  soft-tissue]
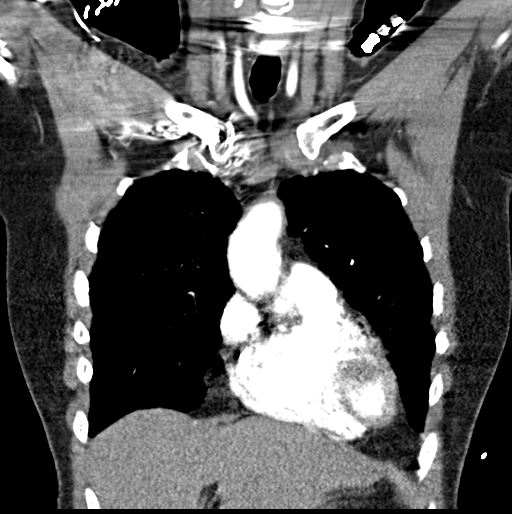
[im 59/117  soft-tissue]
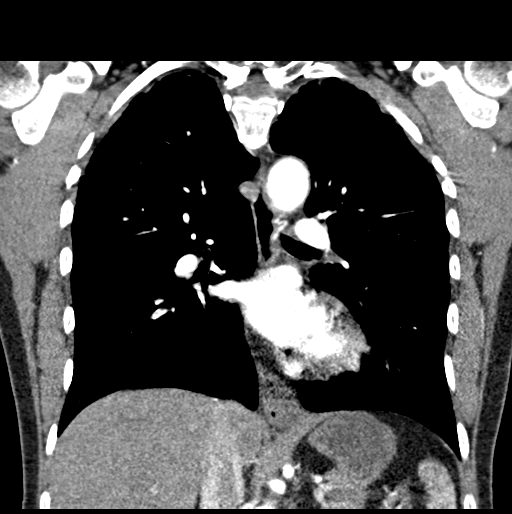
[im 88/117  soft-tissue]
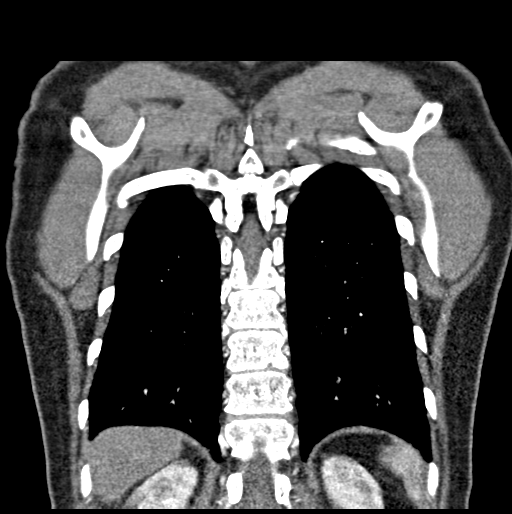

[18 of 46 positions shown; findings below may reference images not displayed]

FINDINGS: CTA CHEST FINDINGS

Cardiovascular: Satisfactory opacification of the pulmonary arteries
to the segmental level. No evidence of pulmonary embolism. Normal
heart size. No pericardial effusion. Central pulmonary arteries are
of normal caliber. Thoracic aorta is unremarkable.

Mediastinum/Nodes: No enlarged mediastinal, hilar, or axillary lymph
nodes. Thyroid gland, trachea, and esophagus demonstrate no
significant findings.

Lungs/Pleura: Lungs are clear. No pleural effusion or pneumothorax.

Musculoskeletal: No acute bone abnormality

Review of the MIP images confirms the above findings.

CT ABDOMEN and PELVIS FINDINGS

Hepatobiliary: No focal liver abnormality is seen. No gallstones,
gallbladder wall thickening, or biliary dilatation.

Pancreas: Unremarkable

Spleen: Unremarkable

Adrenals/Urinary Tract: Adrenal glands are unremarkable. Kidneys are
normal, without renal calculi, focal lesion, or hydronephrosis.
Bladder is unremarkable.

Stomach/Bowel: None mild sigmoid diverticulosis. The stomach, small
bowel, and large bowel are otherwise unremarkable. The appendix is
normal. No free intraperitoneal gas or fluid.

Vascular/Lymphatic: No significant vascular findings are present. No
enlarged abdominal or pelvic lymph nodes.

Reproductive: Status post hysterectomy. No adnexal masses.

Other: Tiny broad-based fat containing umbilical hernia. Rectum
unremarkable.

Musculoskeletal: No acute bone abnormality within the abdomen and
pelvis. Degenerative changes are seen at the lumbosacral junction.
No lytic or blastic bone lesions.

Review of the MIP images confirms the above findings.
IMPRESSION: No acute intrathoracic or intra-abdominal pathology identified. No
definite radiographic explanation for the patient's reported
symptoms.

## 2020-10-08 IMAGING — CT CT ABD-PELV W/ CM
2 of 4 series · 16 of 46 positions shown, 18 images · IV contrast (omnipaque)
Comparison: None.

CLINICAL DATA: Chest pain, vomiting, unspecified abdominal pain

EXAM:
CT ANGIOGRAPHY CHEST
CT ABDOMEN AND PELVIS WITH CONTRAST
TECHNIQUE: Multidetector CT imaging of the chest was performed using the
standard protocol during bolus administration of intravenous
contrast. Multiplanar CT image reconstructions and MIPs were
obtained to evaluate the vascular anatomy. Multidetector CT imaging
of the abdomen and pelvis was performed using the standard protocol
during bolus administration of intravenous contrast.
CONTRAST:  100mL OMNIPAQUE IOHEXOL 350 MG/ML SOLN

[Series 2: axial st · axial · 0.79mm/px · z∈[+956,+1341]mm · 13 of 87 slices shown, 15 images]
[im 5/87  soft-tissue]
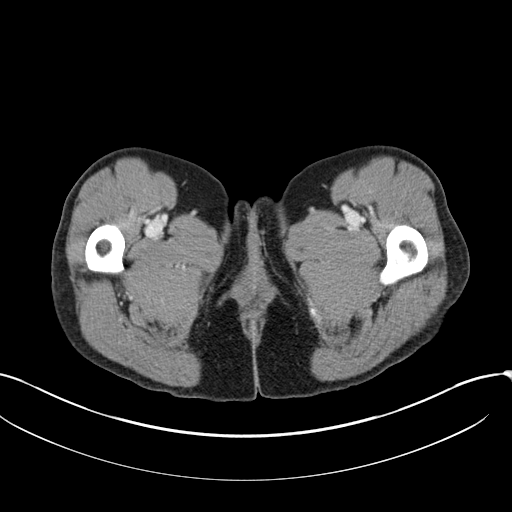
[im 5/87  bone]
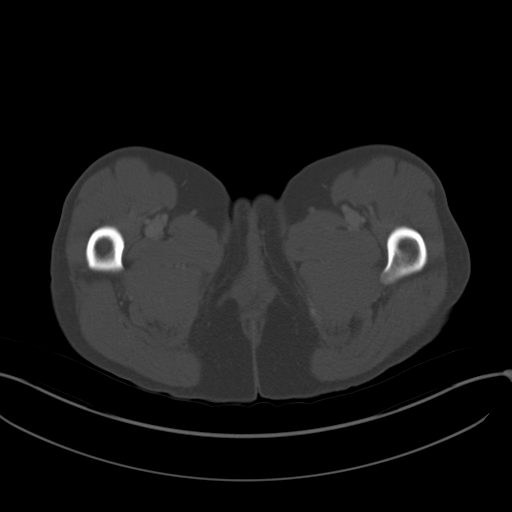
[im 10/87  soft-tissue]
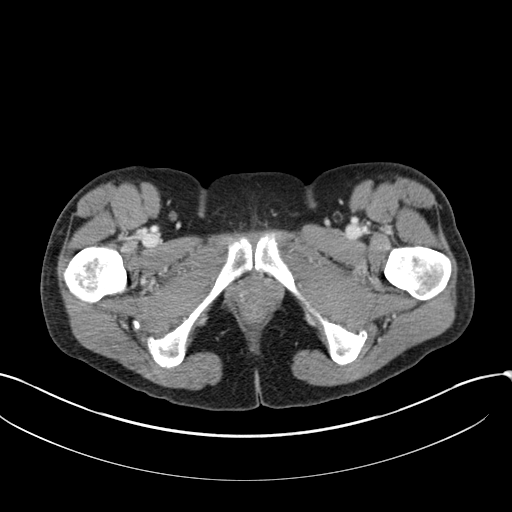
[im 20/87  soft-tissue]
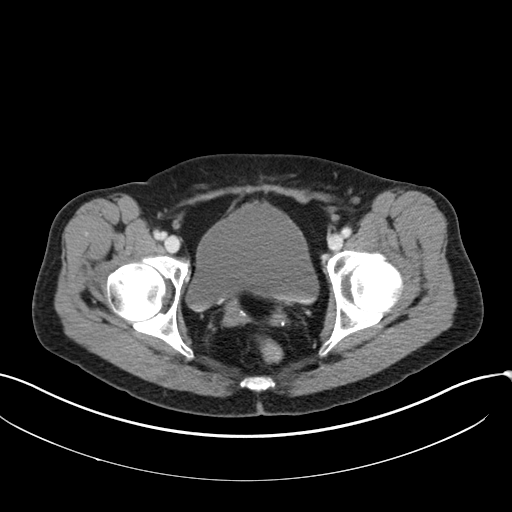
[im 24/87  soft-tissue]
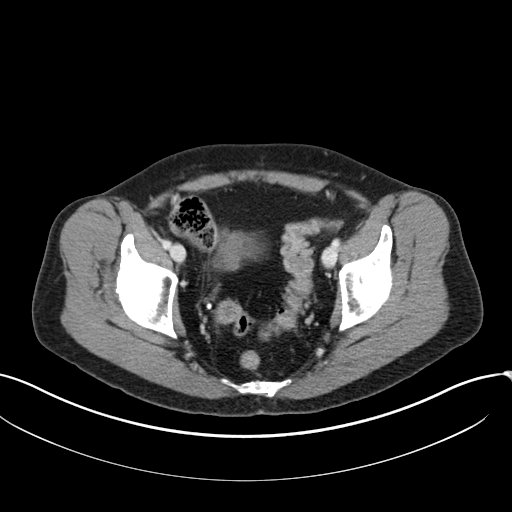
[im 29/87  soft-tissue]
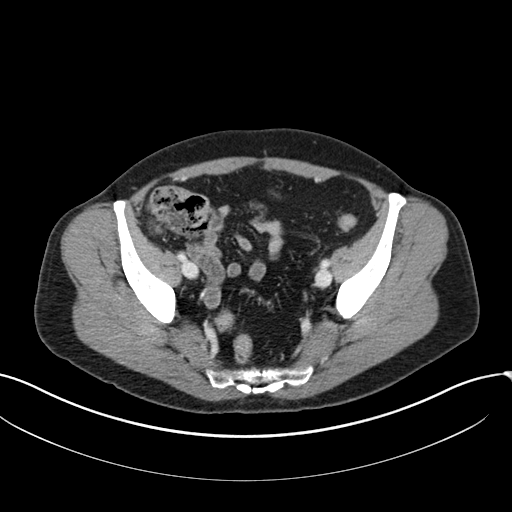
[im 39/87  soft-tissue]
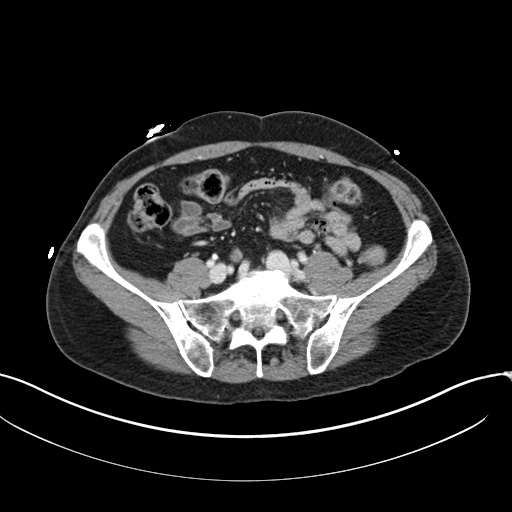
[im 44/87  soft-tissue]
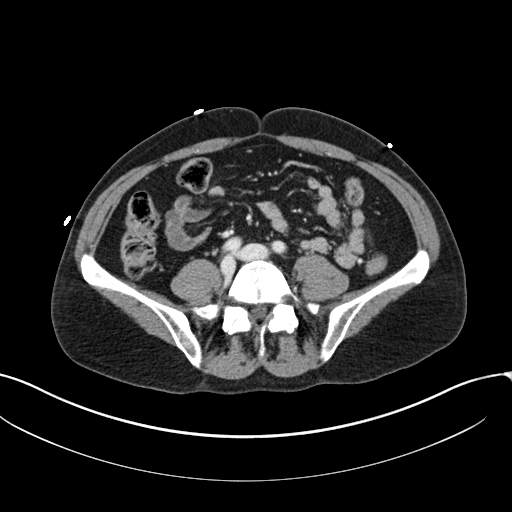
[im 48/87  soft-tissue]
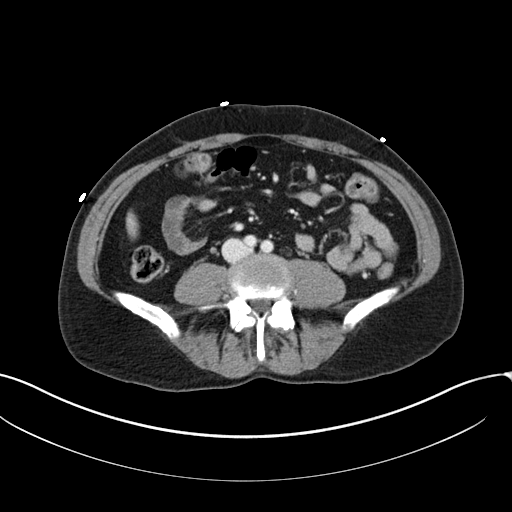
[im 58/87  soft-tissue]
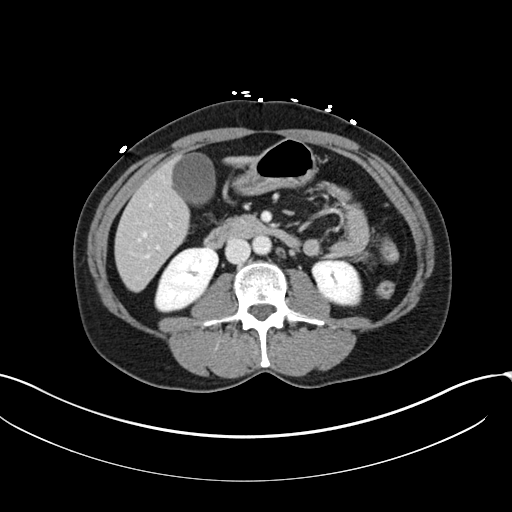
[im 58/87  bone]
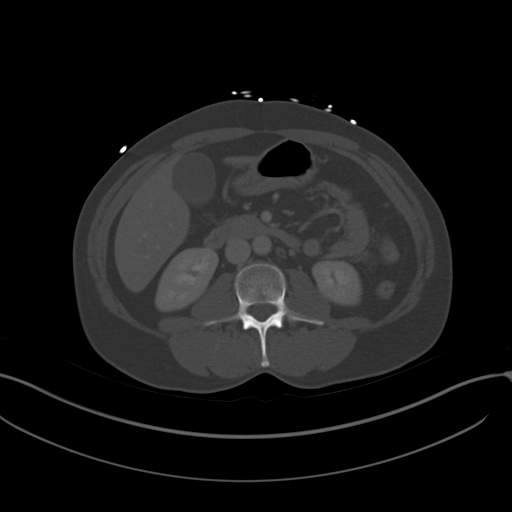
[im 63/87  soft-tissue]
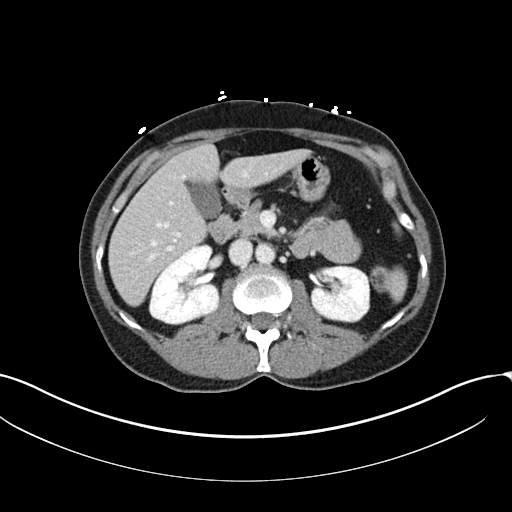
[im 67/87  soft-tissue]
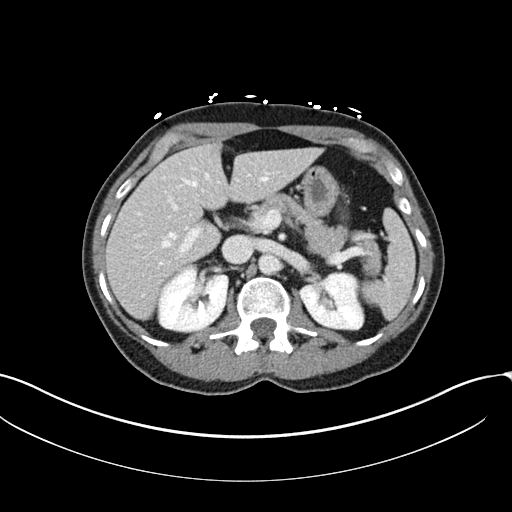
[im 77/87  soft-tissue]
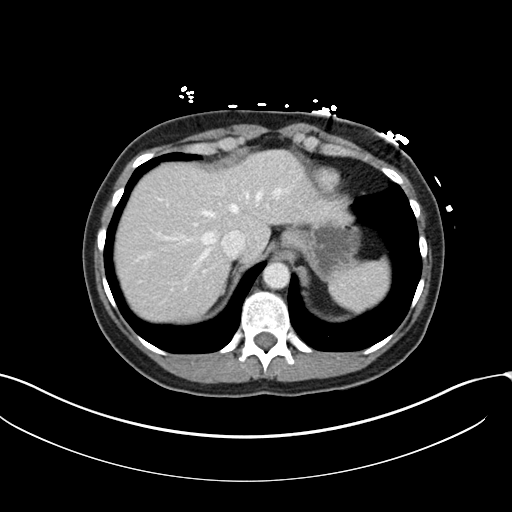
[im 82/87  soft-tissue]
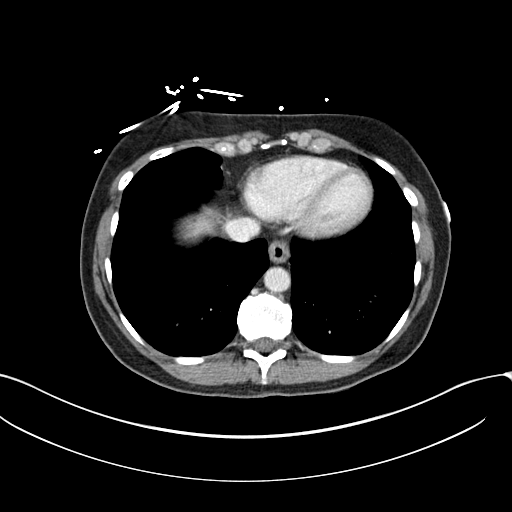

[Series 4: coronal st · coronal · 0.68mm/px · 3 of 85 slices shown]
[im 29/85  soft-tissue]
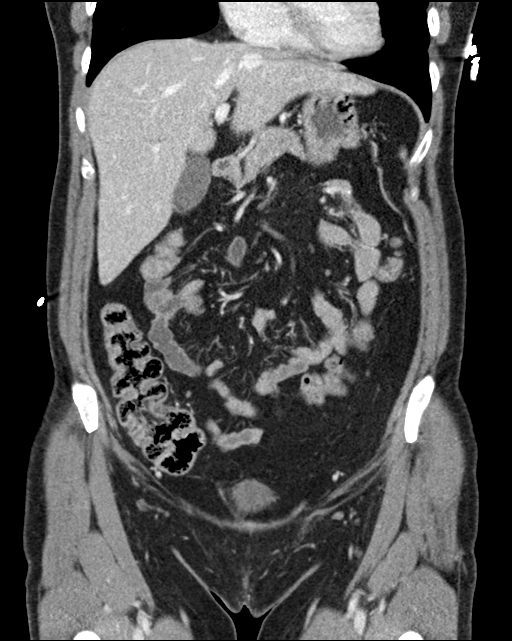
[im 38/85  soft-tissue]
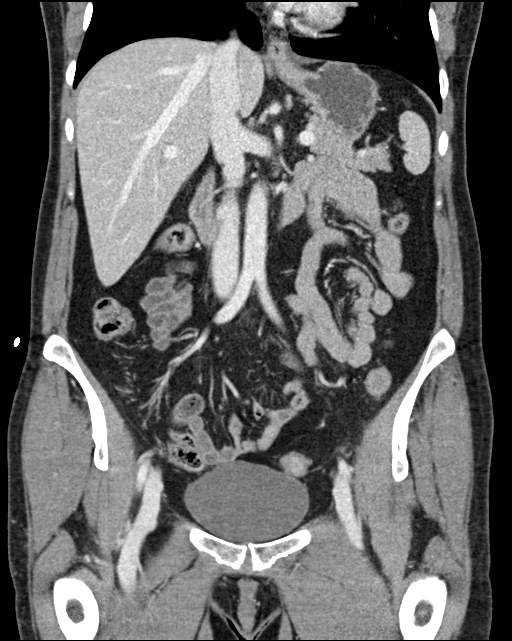
[im 47/85  soft-tissue]
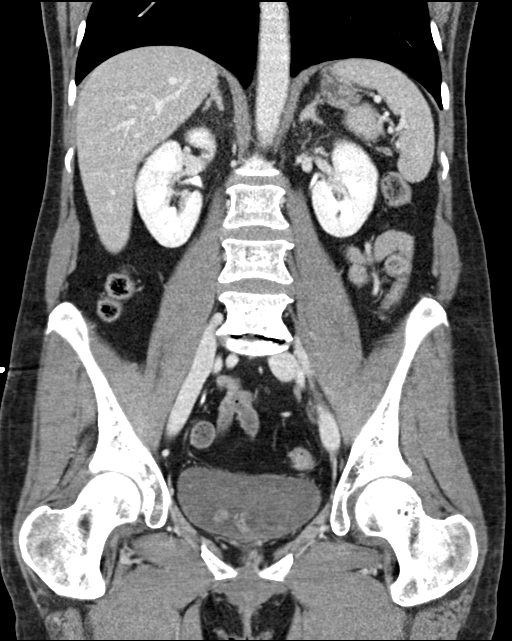

[16 of 46 positions shown; findings below may reference images not displayed]

FINDINGS: CTA CHEST FINDINGS

Cardiovascular: Satisfactory opacification of the pulmonary arteries
to the segmental level. No evidence of pulmonary embolism. Normal
heart size. No pericardial effusion. Central pulmonary arteries are
of normal caliber. Thoracic aorta is unremarkable.

Mediastinum/Nodes: No enlarged mediastinal, hilar, or axillary lymph
nodes. Thyroid gland, trachea, and esophagus demonstrate no
significant findings.

Lungs/Pleura: Lungs are clear. No pleural effusion or pneumothorax.

Musculoskeletal: No acute bone abnormality

Review of the MIP images confirms the above findings.

CT ABDOMEN and PELVIS FINDINGS

Hepatobiliary: No focal liver abnormality is seen. No gallstones,
gallbladder wall thickening, or biliary dilatation.

Pancreas: Unremarkable

Spleen: Unremarkable

Adrenals/Urinary Tract: Adrenal glands are unremarkable. Kidneys are
normal, without renal calculi, focal lesion, or hydronephrosis.
Bladder is unremarkable.

Stomach/Bowel: None mild sigmoid diverticulosis. The stomach, small
bowel, and large bowel are otherwise unremarkable. The appendix is
normal. No free intraperitoneal gas or fluid.

Vascular/Lymphatic: No significant vascular findings are present. No
enlarged abdominal or pelvic lymph nodes.

Reproductive: Status post hysterectomy. No adnexal masses.

Other: Tiny broad-based fat containing umbilical hernia. Rectum
unremarkable.

Musculoskeletal: No acute bone abnormality within the abdomen and
pelvis. Degenerative changes are seen at the lumbosacral junction.
No lytic or blastic bone lesions.

Review of the MIP images confirms the above findings.
IMPRESSION: No acute intrathoracic or intra-abdominal pathology identified. No
definite radiographic explanation for the patient's reported
symptoms.

## 2020-10-08 MED ORDER — DROPERIDOL 2.5 MG/ML IJ SOLN
2.5000 mg | Freq: Once | INTRAMUSCULAR | Status: AC
  Administered 2020-10-08: 2.5 mg via INTRAVENOUS
  Filled 2020-10-08: qty 2

## 2020-10-08 MED ORDER — PROMETHAZINE HCL 25 MG RE SUPP
25.0000 mg | Freq: Four times a day (QID) | RECTAL | Status: DC | PRN
  Administered 2020-10-08: 25 mg via RECTAL
  Filled 2020-10-08 (×2): qty 1

## 2020-10-08 MED ORDER — PROMETHAZINE HCL 25 MG RE SUPP: 25.0000 mg | Freq: Four times a day (QID) | RECTAL | 0 refills | Status: DC | PRN

## 2020-10-08 MED ORDER — IOHEXOL 350 MG/ML SOLN
100.0000 mL | Freq: Once | INTRAVENOUS | Status: AC | PRN
  Administered 2020-10-08: 100 mL via INTRAVENOUS

## 2020-10-08 MED FILL — PROMETHAZINE 25 MG SUPP: 25 | 3 days supply | Qty: 12 | Fill #0

## 2020-10-08 NOTE — ED Notes (Signed)
Pt vomiting at bedside, pt is asking for meds, provider made aware.

## 2020-10-08 NOTE — ED Notes (Signed)
Pt is sleeping at bedside . Nausea has subsided, provider at bedside to go over results. Will continue to monitor.

## 2020-10-08 NOTE — ED Notes (Signed)
Patient transported to CT 

## 2020-10-08 NOTE — ED Provider Notes (Signed)
[facility] DEPT Provider Note   CSN: [id number] Arrival date & time: 10/07/20  2126     History Chief Complaint  Patient presents with  . Chest Pain    Dorothy Wood is a 60 y.o. female.  Patient presents with concern for epigastric pain and chest pain.  Symptoms began last night with multiple episodes of vomiting, non-bloody.  Then she developed chest pain this afternoon with persistent vomiting, now greenish in color.  No fevers, no cough, no diarrhea.  Chest pain described as non-radiating, pressure like sensation.        Past Medical History:  Diagnosis Date  . Cancer (HCC)    OVARIAN  . Colonic polyp   . Hyperlipidemia   . Hypertension   . Sjogren's disease ([place])   . Thyroid disease    HYPOTHYROIDISM    Patient Active Problem List   Diagnosis Date Noted  . OSA (obstructive sleep apnea) 09/10/2020  . TIA (transient ischemic attack) 04/29/2020  . Essential hypertension 04/29/2020  . Hormone replacement therapy (HRT) 09/09/2016  . Hypothyroidism 07/16/2014  . Sjogren's syndrome ([place]) 04/11/2013  . History of ovarian cancer 04/11/2013  . Personal history of colonic polyps 04/11/2013  . ADD (attention deficit disorder) 07/06/2012  . Dysthymia 07/06/2012    Past Surgical History:  Procedure Laterality Date  . ABDOMINAL HYSTERECTOMY     Ovarian cancer  . BREAST CYST ASPIRATION    . CESAREAN SECTION     x2  . COLONOSCOPY  10-06   Dr. Earnest Bailey  . TONSILLECTOMY       OB History   No obstetric history on file.     Family History  Problem Relation Age of Onset  . Mental illness Mother   . Hypertension Mother   . Heart failure Mother   . Mental illness Sister   . Mental illness Brother   . Mental illness Maternal Uncle   . Mental illness Maternal Grandmother   . Cancer Paternal Grandmother   . CAD Father        CABG at age 31    Social History   Tobacco Use  . Smoking status: Never Smoker  . Smokeless  tobacco: Never Used  Substance Use Topics  . Alcohol use: Yes    Comment: 6 glasses per week  . Drug use: No    Home Medications Prior to Admission medications   Medication Sig Start Date End Date Taking? Authorizing Provider  atorvastatin (LIPITOR) 40 MG tablet TAKE 1 TABLET BY MOUTH ONCE DAILY 08/19/20   Denita Lung, MD  buPROPion (WELLBUTRIN XL) 300 MG 24 hr tablet Take 1 tablet (300 mg total) by mouth daily. 04/21/18   Denita Lung, MD  estradiol (ESTRACE) 1 MG tablet Take 0.5 mg by mouth daily.  Patient not taking: Reported on 09/16/2020    [provider]  guanFACINE (TENEX) 2 MG tablet Take 2 mg by mouth in the morning and at bedtime.     [provider]  hydrochlorothiazide (HYDRODIURIL) 25 MG tablet Take 1 tablet (25 mg total) by mouth daily. 09/16/20   Lelon Perla, MD  levothyroxine (SYNTHROID) 88 MCG tablet Take 88 mcg by mouth daily. 11/27/19   [provider]  LORazepam (ATIVAN) 1 MG tablet Take 1 mg by mouth daily as needed for sleep.  04/19/20   [provider]  methylphenidate (RITALIN) 20 MG tablet Take 1 tablet (20 mg total) by mouth 3 (three) times daily. 09/29/18  Denita Lung, MD  MYRBETRIQ 50 MG TB24 tablet Take 50 mg by mouth daily. 04/11/20   [provider]  pilocarpine (SALAGEN) 5 MG tablet Take 1 tablet (5 mg total) by mouth 4 (four) times daily. 06/18/20   Denita Lung, MD    Allergies    Patient has no known allergies.  Review of Systems   Review of Systems  Constitutional: Negative for fever.  HENT: Negative for ear pain.   Eyes: Negative for pain.  Respiratory: Negative for cough.   Cardiovascular: Positive for chest pain.  Gastrointestinal: Positive for abdominal pain.  Genitourinary: Negative for flank pain.  Musculoskeletal: Negative for back pain.  Skin: Negative for rash.  Neurological: Negative for headaches.    Physical Exam Updated Vital Signs BP 135/71 (BP Location: Left Arm)   Pulse  80   Temp (!) 97.5 F (36.4 C) (Oral)   Resp 15   Ht 5\' 5"  (1.651 m)   Wt 58 kg   SpO2 99%   BMI 21.28 kg/m   Physical Exam Constitutional:      General: She is not in acute distress.    Appearance: Normal appearance.  HENT:     Head: Normocephalic.     Nose: Nose normal.  Eyes:     Extraocular Movements: Extraocular movements intact.  Cardiovascular:     Rate and Rhythm: Normal rate.  Pulmonary:     Effort: Pulmonary effort is normal.  Abdominal:     Comments: Mild/moderate epig TTP  Musculoskeletal:        General: Normal range of motion.     Cervical back: Normal range of motion.  Neurological:     General: No focal deficit present.     Mental Status: She is alert. Mental status is at baseline.     ED Results / Procedures / Treatments   Labs (all labs ordered are listed, but only abnormal results are displayed) Labs Reviewed  CBC - Abnormal; Notable for the following components:      Result Value   WBC 11.6 (*)    All other components within normal limits  COMPREHENSIVE METABOLIC PANEL - Abnormal; Notable for the following components:   Glucose, Bld 129 (*)    ALT 49 (*)    All other components within normal limits  LIPASE, BLOOD  D-DIMER, QUANTITATIVE (NOT AT [facility])  TROPONIN I (HIGH SENSITIVITY)  TROPONIN I (HIGH SENSITIVITY)    EKG None  Radiology DG Chest 2 View  Result Date: 10/07/2020 CLINICAL DATA:  Chest pain, nausea, vomiting, body aches for 3 days. EXAM: CHEST - 2 VIEW COMPARISON:  None. FINDINGS: The heart size and mediastinal contours are within normal limits. Both lungs are clear. The visualized skeletal structures are unremarkable. IMPRESSION: No active cardiopulmonary disease. Electronically Signed   By: Lucienne Capers M.D.   On: 10/07/2020 22:06    Procedures Procedures   Medications Ordered in ED Medications  fentaNYL (SUBLIMAZE) injection 50 mcg (50 mcg Intravenous Given 10/07/20 2152)  ondansetron (ZOFRAN) injection 4 mg (4 mg  Intravenous Given 10/07/20 2158)  sodium chloride 0.9 % bolus 1,000 mL (0 mLs Intravenous Stopped 10/07/20 2304)  pantoprazole (PROTONIX) injection 40 mg (40 mg Intravenous Given 10/07/20 2224)  morphine 4 MG/ML injection 4 mg (4 mg Intravenous Given 10/07/20 2221)  iohexol (OMNIPAQUE) 350 MG/ML injection 100 mL (100 mLs Intravenous Contrast Given 10/08/20 0012)    ED Course  I have reviewed the triage vital signs and the nursing notes.  Pertinent  labs & imaging results that were available during my care of the patient were reviewed by me and considered in my medical decision making (see chart for details).    MDM Rules/Calculators/A&P                          Labs normal.    EKG shows no ST elevations, normal sinus, no depressions.  CT imaging ordered.  Signed out to on-coming provider at 12:30AM.   Final Clinical Impression(s) / ED Diagnoses Final diagnoses:  None    Rx / DC Orders ED Discharge Orders    None       Almyra Free Greggory Brandy, MD 10/08/20 [phone]

## 2020-10-08 NOTE — ED Provider Notes (Signed)
Nursing notes and vitals signs, including pulse oximetry, reviewed.  Summary of this visit's results, reviewed by myself:  EKG:  EKG Interpretation  Date/Time:    Ventricular Rate:    PR Interval:    QRS Duration:   QT Interval:    QTC Calculation:   R Axis:     Text Interpretation:         Labs:  Results for orders placed or performed during the hospital encounter of 10/07/20 (from the past 24 hour(s))  CBC     Status: Abnormal   Collection Time: 10/07/20  9:39 PM  Result Value Ref Range   WBC 11.6 (H) 4.0 - 10.5 K/uL   RBC 4.49 3.87 - 5.11 MIL/uL   Hemoglobin 13.8 12.0 - 15.0 g/dL   HCT 41.5 36.0 - 46.0 %   MCV 92.4 80.0 - 100.0 fL   MCH 30.7 26.0 - 34.0 pg   MCHC 33.3 30.0 - 36.0 g/dL   RDW 12.4 11.5 - 15.5 %   Platelets 299 150 - 400 K/uL   nRBC 0.0 0.0 - 0.2 %  Troponin I (High Sensitivity)     Status: None   Collection Time: 10/07/20  9:39 PM  Result Value Ref Range   Troponin I (High Sensitivity) 2 <18 ng/L  Comprehensive metabolic panel     Status: Abnormal   Collection Time: 10/07/20  9:51 PM  Result Value Ref Range   Sodium 139 135 - 145 mmol/L   Potassium 4.0 3.5 - 5.1 mmol/L   Chloride 101 98 - 111 mmol/L   CO2 23 22 - 32 mmol/L   Glucose, Bld 129 (H) 70 - 99 mg/dL   BUN 14 6 - 20 mg/dL   Creatinine, Ser 0.87 0.44 - 1.00 mg/dL   Calcium 9.5 8.9 - 10.3 mg/dL   Total Protein 6.9 6.5 - 8.1 g/dL   Albumin 4.3 3.5 - 5.0 g/dL   AST 31 15 - 41 U/L   ALT 49 (H) 0 - 44 U/L   Alkaline Phosphatase 71 38 - 126 U/L   Total Bilirubin 0.5 0.3 - 1.2 mg/dL   GFR, Estimated >60 >60 mL/min   Anion gap 15 5 - 15  Lipase, blood     Status: None   Collection Time: 10/07/20  9:51 PM  Result Value Ref Range   Lipase 23 11 - 51 U/L  D-dimer, quantitative (not at [facility])     Status: None   Collection Time: 10/07/20 10:18 PM  Result Value Ref Range   D-Dimer, Quant <0.27 0.00 - 0.50 ug/mL-FEU  Troponin I (High Sensitivity)     Status: None   Collection Time: 10/08/20   1:58 AM  Result Value Ref Range   Troponin I (High Sensitivity) <2 <18 ng/L    Imaging Studies: DG Chest 2 View  Result Date: 10/07/2020 CLINICAL DATA:  Chest pain, nausea, vomiting, body aches for 3 days. EXAM: CHEST - 2 VIEW COMPARISON:  None. FINDINGS: The heart size and mediastinal contours are within normal limits. Both lungs are clear. The visualized skeletal structures are unremarkable. IMPRESSION: No active cardiopulmonary disease. Electronically Signed   By: Lucienne Capers M.D.   On: 10/07/2020 22:06   CT ABDOMEN PELVIS W CONTRAST  Result Date: 10/08/2020 CLINICAL DATA:  Chest pain, vomiting, unspecified abdominal pain EXAM: CT ANGIOGRAPHY CHEST CT ABDOMEN AND PELVIS WITH CONTRAST TECHNIQUE: Multidetector CT imaging of the chest was performed using the standard protocol during bolus administration of intravenous contrast. Multiplanar CT image  reconstructions and MIPs were obtained to evaluate the vascular anatomy. Multidetector CT imaging of the abdomen and pelvis was performed using the standard protocol during bolus administration of intravenous contrast. CONTRAST:  125mL OMNIPAQUE IOHEXOL 350 MG/ML SOLN COMPARISON:  None. FINDINGS: CTA CHEST FINDINGS Cardiovascular: Satisfactory opacification of the pulmonary arteries to the segmental level. No evidence of pulmonary embolism. Normal heart size. No pericardial effusion. Central pulmonary arteries are of normal caliber. Thoracic aorta is unremarkable. Mediastinum/Nodes: No enlarged mediastinal, hilar, or axillary lymph nodes. Thyroid gland, trachea, and esophagus demonstrate no significant findings. Lungs/Pleura: Lungs are clear. No pleural effusion or pneumothorax. Musculoskeletal: No acute bone abnormality Review of the MIP images confirms the above findings. CT ABDOMEN and PELVIS FINDINGS Hepatobiliary: No focal liver abnormality is seen. No gallstones, gallbladder wall thickening, or biliary dilatation. Pancreas: Unremarkable Spleen:  Unremarkable Adrenals/Urinary Tract: Adrenal glands are unremarkable. Kidneys are normal, without renal calculi, focal lesion, or hydronephrosis. Bladder is unremarkable. Stomach/Bowel: None mild sigmoid diverticulosis. The stomach, small bowel, and large bowel are otherwise unremarkable. The appendix is normal. No free intraperitoneal gas or fluid. Vascular/Lymphatic: No significant vascular findings are present. No enlarged abdominal or pelvic lymph nodes. Reproductive: Status post hysterectomy. No adnexal masses. Other: Tiny broad-based fat containing umbilical hernia. Rectum unremarkable. Musculoskeletal: No acute bone abnormality within the abdomen and pelvis. Degenerative changes are seen at the lumbosacral junction. No lytic or blastic bone lesions. Review of the MIP images confirms the above findings. IMPRESSION: No acute intrathoracic or intra-abdominal pathology identified. No definite radiographic explanation for the patient's reported symptoms. Electronically Signed   By: Fidela Salisbury MD   On: 10/08/2020 00:36   CT ANGIO CHEST AORTA W/CM & OR WO/CM  Result Date: 10/08/2020 CLINICAL DATA:  Chest pain, vomiting, unspecified abdominal pain EXAM: CT ANGIOGRAPHY CHEST CT ABDOMEN AND PELVIS WITH CONTRAST TECHNIQUE: Multidetector CT imaging of the chest was performed using the standard protocol during bolus administration of intravenous contrast. Multiplanar CT image reconstructions and MIPs were obtained to evaluate the vascular anatomy. Multidetector CT imaging of the abdomen and pelvis was performed using the standard protocol during bolus administration of intravenous contrast. CONTRAST:  163mL OMNIPAQUE IOHEXOL 350 MG/ML SOLN COMPARISON:  None. FINDINGS: CTA CHEST FINDINGS Cardiovascular: Satisfactory opacification of the pulmonary arteries to the segmental level. No evidence of pulmonary embolism. Normal heart size. No pericardial effusion. Central pulmonary arteries are of normal caliber. Thoracic  aorta is unremarkable. Mediastinum/Nodes: No enlarged mediastinal, hilar, or axillary lymph nodes. Thyroid gland, trachea, and esophagus demonstrate no significant findings. Lungs/Pleura: Lungs are clear. No pleural effusion or pneumothorax. Musculoskeletal: No acute bone abnormality Review of the MIP images confirms the above findings. CT ABDOMEN and PELVIS FINDINGS Hepatobiliary: No focal liver abnormality is seen. No gallstones, gallbladder wall thickening, or biliary dilatation. Pancreas: Unremarkable Spleen: Unremarkable Adrenals/Urinary Tract: Adrenal glands are unremarkable. Kidneys are normal, without renal calculi, focal lesion, or hydronephrosis. Bladder is unremarkable. Stomach/Bowel: None mild sigmoid diverticulosis. The stomach, small bowel, and large bowel are otherwise unremarkable. The appendix is normal. No free intraperitoneal gas or fluid. Vascular/Lymphatic: No significant vascular findings are present. No enlarged abdominal or pelvic lymph nodes. Reproductive: Status post hysterectomy. No adnexal masses. Other: Tiny broad-based fat containing umbilical hernia. Rectum unremarkable. Musculoskeletal: No acute bone abnormality within the abdomen and pelvis. Degenerative changes are seen at the lumbosacral junction. No lytic or blastic bone lesions. Review of the MIP images confirms the above findings. IMPRESSION: No acute intrathoracic or intra-abdominal pathology identified. No definite radiographic explanation  for the patient's reported symptoms. Electronically Signed   By: Fidela Salisbury MD   On: 10/08/2020 00:36   EKG Interpretation:  Date & Time: 10/07/2020 9:39 PM  Rate: 89  Rhythm: normal sinus rhythm  QRS Axis: normal  Intervals: normal  ST/T Wave abnormalities: normal  Conduction Disutrbances:none  Narrative Interpretation:   Old EKG Reviewed: none available   3:41 AM Patient has had no further vomiting since droperidol 2.5 mg IV but continues to have nausea.  She denies any  pain at this time.  She was advised of her reassuring EKG, laboratory studies and CT scans.  I see no indication for admission at this time.  She would like a prescription for Phenergan suppositories for use at home.      Zynia Wojtowicz, Jenny Reichmann, MD 10/08/20 [phone]

## 2020-10-09 ENCOUNTER — Other Ambulatory Visit: Payer: Self-pay | Admitting: Family Medicine

## 2020-10-09 ENCOUNTER — Other Ambulatory Visit: Payer: Self-pay

## 2020-10-09 ENCOUNTER — Telehealth: Payer: 59 | Admitting: Family Medicine

## 2020-10-09 ENCOUNTER — Encounter: Payer: Self-pay | Admitting: Family Medicine

## 2020-10-09 VITALS — BP 162/88 | HR 96 | Temp 98.4°F | Ht 65.0 in | Wt 132.0 lb

## 2020-10-09 DIAGNOSIS — I10 Essential (primary) hypertension: Secondary | ICD-10-CM

## 2020-10-09 DIAGNOSIS — R112 Nausea with vomiting, unspecified: Secondary | ICD-10-CM

## 2020-10-09 MED ORDER — ONDANSETRON 4 MG PO TBDP: 4.0000 mg | ORAL_TABLET | Freq: Three times a day (TID) | ORAL | 0 refills | Status: DC | PRN

## 2020-10-09 MED FILL — ONDANSETRON ODT 4 MG TABLET: 4 | 4 days supply | Qty: 20 | Fill #0

## 2020-10-09 NOTE — Progress Notes (Signed)
Start time: 12:07 End time: 12:34 No sound on video (done from computer), couldn't get it to work. I called to have audio that way, but then they disconnected the video.  I did get to see her and daughter for a bit while trying to figure it out, before disconnecting. The bulk of the visit was done via telephone   Virtual Visit via Telephone Note  I connected with [place] Cellar on 10/09/20 by telephone and verified that I am speaking with the correct person using two identifiers.  Location: Patient: at home, with her daughter [place] Provider: office   I discussed the limitations of evaluation and management by telemedicine and the availability of in person appointments. The patient expressed understanding and agreed to proceed.  History of Present Illness:  Chief Complaint  Patient presents with  . Emesis    VIRTUAL vomiting, body aches and chills. Symptoms started 2:45am Monday. Had PCR test today through [facility].    Monday morning she woke up 2:45 with vomiting, chills. Nauseated, can't keep anything down. She was throwing up bile, and was having pain in the chest. Went to the ER on 1/24 and reports she was treated with protonix.  She was prescribed phenergan suppositories,which helps some. She has also been taking sublingual ativan (1/2 tablet),which helps some for nausea.  Her husband has 8mg  zofran--took 1/2 didn't help, vomited after taking it (is tablet, not ODT).  She is complaining of fatigue, can hardly get out of bed; nausea, vomiting.  She never had fever  Taking small sips of gatorade, which helps. Daughter states that she only took 5 ounces total. Patient states that her urine is lighter than yesterday.   She denies abdominal pain.  She had some diarrhea at first, resolved.  She reports she has a stricture, takes miralax BID regularly  Last BM was yesterday. Hasn't taken miralax yesterday or today No regular meds since Sunday-- Today taking some  (see below).  PMH, PSH, SH reviewed Hypothyroid, sjogren's synd, HTN, ADD She is a Marine scientist, works from home.   Outpatient Encounter Medications as of 10/09/2020  Medication Sig Note  . acetaminophen (TYLENOL) 500 MG tablet Take 500 mg by mouth every 6 (six) hours as needed. 10/09/2020: Last dose 10:15 this morning  . atorvastatin (LIPITOR) 40 MG tablet TAKE 1 TABLET BY MOUTH ONCE DAILY   . buPROPion (WELLBUTRIN XL) 300 MG 24 hr tablet Take 1 tablet (300 mg total) by mouth daily.   Marland Kitchen guanFACINE (TENEX) 2 MG tablet Take 2 mg by mouth in the morning and at bedtime.    . hydrochlorothiazide (HYDRODIURIL) 25 MG tablet Take 1 tablet (25 mg total) by mouth daily.   Marland Kitchen levothyroxine (SYNTHROID) 88 MCG tablet Take 88 mcg by mouth daily.   . methylphenidate (RITALIN) 20 MG tablet Take 1 tablet (20 mg total) by mouth 3 (three) times daily.   Marland Kitchen MYRBETRIQ 50 MG TB24 tablet Take 50 mg by mouth daily.   . pilocarpine (SALAGEN) 5 MG tablet Take 1 tablet (5 mg total) by mouth 4 (four) times daily.   . promethazine (PHENERGAN) 25 MG suppository Place 1 suppository (25 mg total) rectally every 6 (six) hours as needed for nausea or vomiting.   Marland Kitchen estradiol (ESTRACE) 1 MG tablet Take 0.5 mg by mouth daily.  (Patient not taking: No sig reported)   . LORazepam (ATIVAN) 1 MG tablet Take 1 mg by mouth daily as needed for sleep.  (Patient not taking: Reported on  10/09/2020) 04/29/2020: Just filled.    No facility-administered encounter medications on file as of 10/09/2020.   Did take HCTZ today, not yesterday BP 140's yestreday so took today (though BP higher today)  No Known Allergies  ROS: no fever.  Nausea, vomiting per HPI, with fatigue. Off-balance with walking. Denies light headedness or syncope. No URI symptoms, ear fullness. No urinary complaints.  Diarrhea resolved. No abdominal pain. See HPI   Observations/Objective:  BP (!) 162/88   Pulse 96   Temp 98.4 F (36.9 C) (Temporal)   Ht 5\' 5"  (1.651 m)    Wt 132 lb (59.9 kg)   BMI 21.97 kg/m   Thin female, mildly ill-appearing, in no distress. Speaking normally, voice a little weak. She is alert, oriented.  Recent studies reviewed from ER visit:  Lab Results  Component Value Date   WBC 11.6 (H) 10/07/2020   HGB 13.8 10/07/2020   HCT 41.5 10/07/2020   MCV 92.4 10/07/2020   PLT 299 10/07/2020     Chemistry      Component Value Date/Time   NA 139 10/07/2020 2151   NA 143 07/22/2020 1341   K 4.0 10/07/2020 2151   CL 101 10/07/2020 2151   CO2 23 10/07/2020 2151   BUN 14 10/07/2020 2151   BUN 14 07/22/2020 1341   CREATININE 0.87 10/07/2020 2151   CREATININE 0.81 09/09/2016 1121      Component Value Date/Time   CALCIUM 9.5 10/07/2020 2151   ALKPHOS 71 10/07/2020 2151   AST 31 10/07/2020 2151   ALT 49 (H) 10/07/2020 2151   BILITOT 0.5 10/07/2020 2151   BILITOT <0.2 07/22/2020 1341     Lab Results  Component Value Date   LIPASE 23 10/07/2020   Normal D-dimer, troponins Normal CXR  CT Angio chest: IMPRESSION: No acute intrathoracic or intra-abdominal pathology identified. No definite radiographic explanation for the patient's reported Symptoms.  CT abdomen/pelvis: IMPRESSION: No acute intrathoracic or intra-abdominal pathology identified. No definite radiographic explanation for the patient's reported symptoms.  Assessment and Plan:  Intractable vomiting with nausea, unspecified vomiting type - No etiology found in labs/imaging. Suspect gastroenteritis. Try zofran ODT, along with phenergan suppositories, if needed. Gentle hydration.  - Plan: ondansetron (ZOFRAN ODT) 4 MG disintegrating tablet  Essential hypertension - BP elevated today. Some med noncompliance. Monitor BP closely--if BP okay, consider hold HCTZ or lower dose, to limit further dehydration. Take Guanfacine   Reviewed s/sx dehydration, for which she would need to go back to ER for IV fluids Discussed antacids if needed for ongoing chest pain (seems  to have improved with the PPI given in ER).  F/u for persistent/worsening sx.   Follow Up Instructions:    I discussed the assessment and treatment plan with the patient. The patient was provided an opportunity to ask questions and all were answered. The patient agreed with the plan and demonstrated an understanding of the instructions.   The patient was advised to call back or seek an in-person evaluation if the symptoms worsen or if the condition fails to improve as anticipated.  I spent 33 minutes dedicated to the care of this patient, including pre-visit review of records, face to face time, post-visit ordering of testing and documentation.    Vikki Ports, MD

## 2020-10-09 NOTE — Patient Instructions (Signed)
Use the zofran oral disintegrating tablet as needed for nausea.  If 4mg  isn't effective, you can increase to 8mg . You can continue the phenergan suppositories, if needed (If zofran isn't adequately controlling symptoms). I would stop the lorazepam--likely contributing to more fatigue and not sure how much it is helping with the nausea.  Continue to take small sips and gently hydrate. If you are unable to keep fluids down, if you are severely lightheaded, feel like you may faint, or develop other new/worsening symptoms (ie vomiting blood), you will need to seek more emergent evaluation.  I hope you feel better soon!

## 2020-10-10 ENCOUNTER — Encounter: Payer: Self-pay | Admitting: Physical Therapy

## 2020-10-10 ENCOUNTER — Encounter: Payer: Self-pay | Admitting: Family Medicine

## 2020-10-12 ENCOUNTER — Other Ambulatory Visit: Payer: Self-pay | Admitting: Family Medicine

## 2020-10-12 ENCOUNTER — Telehealth: Payer: 59 | Admitting: Family Medicine

## 2020-10-12 ENCOUNTER — Encounter: Payer: Self-pay | Admitting: Family Medicine

## 2020-10-12 DIAGNOSIS — K146 Glossodynia: Secondary | ICD-10-CM | POA: Diagnosis not present

## 2020-10-12 MED ORDER — NYSTATIN 100000 UNIT/ML MT SUSP: 5.0000 mL | Freq: Three times a day (TID) | OROMUCOSAL | 1 refills | Status: DC | PRN

## 2020-10-12 MED FILL — NYSTATIN 100,000 UNITS/ML S: 100000 | 4 days supply | Qty: 60 | Fill #0

## 2020-10-12 NOTE — Progress Notes (Signed)
[name], finlayson are scheduled for a virtual visit with your provider today.    Just as we do with appointments in the office, we must obtain your consent to participate.  Your consent will be active for this visit and any virtual visit you may have with one of our providers in the next 365 days.    If you have a MyChart account, I can also send a copy of this consent to you electronically.  All virtual visits are billed to your insurance company just like a traditional visit in the office.  As this is a virtual visit, video technology does not allow for your provider to perform a traditional examination.  This may limit your provider's ability to fully assess your condition.  If your provider identifies any concerns that need to be evaluated in person or the need to arrange testing such as labs, EKG, etc, we will make arrangements to do so.    Although advances in technology are sophisticated, we cannot ensure that it will always work on either your end or our end.  If the connection with a video visit is poor, we may have to switch to a telephone visit.  With either a video or telephone visit, we are not always able to ensure that we have a secure connection.   I need to obtain your verbal consent now.   Are you willing to proceed with your visit today?   Dorothy Wood has provided verbal consent on 10/12/2020 for a virtual visit (video or telephone).   Perlie Mayo, NP 10/12/2020  12:11 PM   Date:  10/12/2020   ID:  [name], DOB 1961-03-15, MRN [id number]  Patient Location: Home Provider Location: Home Office   Participants: Patient and Provider for Visit and Wrap up  Method of visit: Video  Location of Patient: Home Location of Provider: Home Office Consent was obtain for visit over the video. Services rendered by provider: Visit was performed via video  A video enabled telemedicine application was used and I verified that I am speaking with the correct person using two  identifiers.  PCP:  Denita Lung, MD   Chief Complaint:  Sore tongue  History of Present Illness:    Dorothy Wood is a 60 y.o. female with history as stated below. Presents video telehealth for an acute care visit secondary to developing a sore tongue after having possible gastroenteritis. She has some burning and raw feeling in the tongue. She was vomiting with body aches and chills. PCR was neg for COVID. She has a virtual visit w PCP office- note in epic on 10/09/20 for review. Was taking zofran tablets, but was switched to disintegrating tabs to help. She reports feeling much better as far as that issue goes, but now has a burning sore tongue with a little white coating. She has no other concerns today.  Past Medical, Surgical, Social History, Allergies, and Medications have been Reviewed.  Past Medical History:  Diagnosis Date  . Cancer (HCC)    OVARIAN  . Colonic polyp   . Hyperlipidemia   . Hypertension   . Sjogren's disease ([place])   . Thyroid disease    HYPOTHYROIDISM    Current Meds  Medication Sig  . nystatin (MYCOSTATIN) 100000 UNIT/ML suspension Take 5 mLs (500,000 Units total) by mouth 3 (three) times daily as needed.     Allergies:   Patient has no known allergies.   ROS See HPI for history of present illness.  Physical Exam Constitutional:      Appearance: Normal appearance.  HENT:     Head: Normocephalic and atraumatic.     Nose: Nose normal.     Mouth/Throat:     Lips: Pink.     Comments: Tongue is raw-red in center with white patches on lateral aspects Eyes:     Extraocular Movements: Extraocular movements intact.     Conjunctiva/sclera: Conjunctivae normal.     Pupils: Pupils are equal, round, and reactive to light.  Pulmonary:     Comments: No shortness of breath in conversation or cough  Musculoskeletal:        General: Normal range of motion.     Cervical back: Normal range of motion.  Neurological:     Mental Status: She is alert and  oriented to person, place, and time.               1. Soreness of tongue -most likely the start of thrust post vomiting acidic bile which burned her taste buds -will start nystatin suspension -encouraged fluids and bland soft foods, as well as good mouth care -advised this can happen with highly acidic foods and body fluids, but that the tongue will repair itself in several days, nystatin to hep prevent yeast   - nystatin (MYCOSTATIN) 100000 UNIT/ML suspension; Take 5 mLs (500,000 Units total) by mouth 3 (three) times daily as needed.  Dispense: 60 mL; Refill: 1    Time:   Today, I have spent 15 minutes with the patient with telehealth technology discussing the above problems, reviewing the chart, previous notes, medications and orders.    Tests Ordered: No orders of the defined types were placed in this encounter.   Medication Changes: Meds ordered this encounter  Medications  . nystatin (MYCOSTATIN) 100000 UNIT/ML suspension    Sig: Take 5 mLs (500,000 Units total) by mouth 3 (three) times daily as needed.    Dispense:  60 mL    Refill:  1    Order Specific Question:   Supervising Provider    Answer:   Fayrene Helper [[id number]]     Disposition:  Follow up as needed Signed, Perlie Mayo, NP  10/12/2020 12:11 PM

## 2020-10-12 NOTE — Patient Instructions (Addendum)
I appreciate the opportunity to provide you with care for your health and wellness.  Follow up: as needed  Glad you are feeling better from the stomach issue.  I hope the nystatin will help your tongue feel better as it heals and keep it from developing yeast as it does.  Keep hydrating well and eat soft bland foods until healed.  Practice good mouth care to help the healing.  Please continue to practice social distancing to keep you, your family, and our community safe.  If you must go out, please wear a mask and practice good handwashing.

## 2020-10-14 ENCOUNTER — Encounter: Payer: Self-pay | Admitting: Physical Therapy

## 2020-10-15 ENCOUNTER — Encounter: Payer: 59 | Admitting: Physical Therapy

## 2020-10-21 ENCOUNTER — Ambulatory Visit: Payer: 59 | Admitting: Cardiology

## 2020-10-21 ENCOUNTER — Other Ambulatory Visit (HOSPITAL_COMMUNITY): Payer: Self-pay | Admitting: Physician Assistant

## 2020-10-21 DIAGNOSIS — F419 Anxiety disorder, unspecified: Secondary | ICD-10-CM | POA: Diagnosis not present

## 2020-10-21 DIAGNOSIS — F902 Attention-deficit hyperactivity disorder, combined type: Secondary | ICD-10-CM | POA: Diagnosis not present

## 2020-10-22 ENCOUNTER — Other Ambulatory Visit (HOSPITAL_COMMUNITY): Payer: Self-pay | Admitting: Physician Assistant

## 2020-10-22 ENCOUNTER — Encounter: Payer: 59 | Admitting: Physical Therapy

## 2020-10-22 MED FILL — buPROPion HCL ER (XL) 300 M: 300 | 30 days supply | Qty: 30 | Fill #0

## 2020-10-22 MED FILL — guanFACINE HCL 2 MG TABS: 2 | 30 days supply | Qty: 60 | Fill #0

## 2020-10-23 NOTE — Progress Notes (Signed)
HPI: FU hypertension and family h/o CAD. Calcium score February 2021 0.  Echocardiogram August 2021 showed normal LV function.  Brain MRI/MRA August 2021 normal.  Since last seen, the patient denies any dyspnea on exertion, orthopnea, PND, pedal edema, palpitations, syncope or chest pain.   Current Outpatient Medications  Medication Sig Dispense Refill  . atorvastatin (LIPITOR) 40 MG tablet TAKE 1 TABLET BY MOUTH ONCE DAILY 90 tablet 1  . buPROPion (WELLBUTRIN XL) 300 MG 24 hr tablet Take 1 tablet (300 mg total) by mouth daily. 90 tablet 3  . guanFACINE (TENEX) 2 MG tablet Take 2 mg by mouth in the morning and at bedtime.     . hydrochlorothiazide (HYDRODIURIL) 25 MG tablet Take 1 tablet (25 mg total) by mouth daily. 90 tablet 2  . levothyroxine (SYNTHROID) 88 MCG tablet Take 88 mcg by mouth daily.    . methylphenidate (RITALIN) 20 MG tablet Take 1 tablet (20 mg total) by mouth 3 (three) times daily. 270 tablet 0  . MYRBETRIQ 50 MG TB24 tablet Take 50 mg by mouth daily.    . pilocarpine (SALAGEN) 5 MG tablet Take 1 tablet (5 mg total) by mouth 4 (four) times daily. 360 tablet 1   No current facility-administered medications for this visit.     Past Medical History:  Diagnosis Date  . Cancer (HCC)    OVARIAN  . Colonic polyp   . Hyperlipidemia   . Hypertension   . Sjogren's disease ([place])   . Thyroid disease    HYPOTHYROIDISM    Past Surgical History:  Procedure Laterality Date  . ABDOMINAL HYSTERECTOMY     Ovarian cancer  . BREAST CYST ASPIRATION    . CESAREAN SECTION     x2  . COLONOSCOPY  10-06   Dr. Earnest Bailey  . TONSILLECTOMY      Social History   Socioeconomic History  . Marital status: Married    Spouse name: Not on file  . Number of children: 2  . Years of education: Not on file  . Highest education level: Not on file  Occupational History  . Not on file  Tobacco Use  . Smoking status: Never Smoker  . Smokeless tobacco: Never Used  Substance and  Sexual Activity  . Alcohol use: Yes    Comment: 6 glasses per week  . Drug use: No  . Sexual activity: Not Currently  Other Topics Concern  . Not on file  Social History Narrative  . Not on file   Social Determinants of Health   Financial Resource Strain: Not on file  Food Insecurity: Not on file  Transportation Needs: Not on file  Physical Activity: Not on file  Stress: Not on file  Social Connections: Not on file  Intimate Partner Violence: Not on file    Family History  Problem Relation Age of Onset  . Mental illness Mother   . Hypertension Mother   . Heart failure Mother   . Mental illness Sister   . Mental illness Brother   . Mental illness Maternal Uncle   . Mental illness Maternal Grandmother   . Cancer Paternal Grandmother   . CAD Father        CABG at age 5    ROS: no fevers or chills, productive cough, hemoptysis, dysphasia, odynophagia, melena, hematochezia, dysuria, hematuria, rash, seizure activity, orthopnea, PND, pedal edema, claudication. Remaining systems are negative.  Physical Exam: Well-developed well-nourished in no acute distress.  Skin is warm and  dry.  HEENT is normal.  Neck is supple.  Chest is clear to auscultation with normal expansion.  Cardiovascular exam is regular rate and rhythm.  Abdominal exam nontender or distended. No masses palpated. Extremities show no edema. neuro grossly intact  A/P  1 hypertension-patient's blood pressure is controlled.  Continue present medications and follow.  2 hyperlipidemia-most recent laboratories from November 2021 reviewed and shows LDL 76.  Continue statin.  3 family history of coronary artery disease-previous calcium score 0.  No chest pain.  Continue lifestyle modification.  Kirk Ruths, MD

## 2020-10-28 DIAGNOSIS — F419 Anxiety disorder, unspecified: Secondary | ICD-10-CM | POA: Diagnosis not present

## 2020-10-28 DIAGNOSIS — F902 Attention-deficit hyperactivity disorder, combined type: Secondary | ICD-10-CM | POA: Diagnosis not present

## 2020-10-29 ENCOUNTER — Other Ambulatory Visit: Payer: Self-pay

## 2020-10-29 ENCOUNTER — Encounter: Payer: Self-pay | Admitting: Physical Therapy

## 2020-10-29 ENCOUNTER — Ambulatory Visit: Payer: 59 | Admitting: Physical Therapy

## 2020-10-29 DIAGNOSIS — M6281 Muscle weakness (generalized): Secondary | ICD-10-CM

## 2020-10-29 DIAGNOSIS — M79604 Pain in right leg: Secondary | ICD-10-CM

## 2020-10-29 DIAGNOSIS — R29898 Other symptoms and signs involving the musculoskeletal system: Secondary | ICD-10-CM

## 2020-10-29 NOTE — Therapy (Signed)
[facility] Physical Therapy [address] [place], [place], [address] Phone: [phone]   Fax:  [phone]  Physical Therapy Treatment  Patient Details  Name: Dorothy Wood MRN: [id number] Date of Birth: 1961-02-03 Referring Provider (PT): Denita Lung, MD   Encounter Date: 10/29/2020   PT End of Session - 10/29/20 1339    Visit Number 2    Number of Visits 6    Date for PT Re-Evaluation 12/09/20    Authorization Type [facility]    PT Start Time 1300    PT Stop Time 1335    PT Time Calculation (min) 35 min    Activity Tolerance Patient tolerated treatment well    Behavior During Therapy [facility] for tasks assessed/performed           Past Medical History:  Diagnosis Date  . Cancer (HCC)    OVARIAN  . Colonic polyp   . Hyperlipidemia   . Hypertension   . Sjogren's disease ([place])   . Thyroid disease    HYPOTHYROIDISM    Past Surgical History:  Procedure Laterality Date  . ABDOMINAL HYSTERECTOMY     Ovarian cancer  . BREAST CYST ASPIRATION    . CESAREAN SECTION     x2  . COLONOSCOPY  10-06   Dr. Earnest Bailey  . TONSILLECTOMY      There were no vitals filed for this visit.   Subjective Assessment - 10/29/20 1307    Subjective Lateral hamstring on the Rt is more painful at this time.  Driving has been the most uncomfortable.    Patient Stated Goals improve pain and symptoms    Currently in Pain? No/denies                             [facility] Adult PT Treatment/Exercise - 10/29/20 1308      Exercises   Exercises Knee/Hip      Knee/Hip Exercises: Aerobic   Recumbent Bike L4 x 8 min      Manual Therapy   Manual Therapy Soft tissue mobilization    Manual therapy comments skilled palpation and monitoring of soft tissue during DN    Soft tissue mobilization STM and compression to Rt hamstrings            Trigger Point Dry Needling - 10/29/20 1338    Consent Given? Yes    Education Handout Provided Previously provided     Muscles Treated Lower Quadrant Hamstring    Electrical Stimulation Performed with Dry Needling Yes    E-stim with Dry Needling Details to tolerance x 5 min    Hamstring Response Twitch response elicited                  PT Short Term Goals - 10/29/20 1345      PT SHORT TERM GOAL #1   Title report 50% decrease in symptoms for improved function    Status Achieved    Target Date 10/14/20      PT SHORT TERM GOAL #2   Title independent with initial HEP    Status Achieved    Target Date 10/28/20             PT Long Term Goals - 10/29/20 1345      PT LONG TERM GOAL #1   Title independent with HEP    Status On-going    Target Date 12/09/20      PT LONG TERM GOAL #2  Title report decrease in pain by 75% for improved function and decreased work interruptions    Status On-going    Target Date 12/09/20      PT LONG TERM GOAL #3   Title demonstrate 5/5 Rt knee flexion strength for improved function    Status On-going    Target Date 12/09/20      PT LONG TERM GOAL #4   Title FOTO score improved to 64 for improved function    Status On-going    Target Date 12/09/20                 Plan - 10/29/20 1348    Clinical Impression Statement Pt tolerated session well today with decreased pain with simulated car transfer after DN with estim today.  Will continue to benefit from PT to maximize function.    Personal Factors and Comorbidities Comorbidity 3+    Comorbidities hx hamstring tear, ovarian cancer, CVA, HTN, sjogren's syndrome    Examination-Activity Limitations Locomotion Level;Squat;Stairs;Stand;Lift    Examination-Participation Restrictions Cleaning;Occupation;Laundry;Community Activity    Stability/Clinical Decision Making Stable/Uncomplicated    Rehab Potential Good    PT Frequency Biweekly    PT Duration 12 weeks    PT Treatment/Interventions ADLs/Self Care Home Management;Cryotherapy;Electrical Stimulation;Ultrasound;Moist Heat;Iontophoresis 4mg /ml  Dexamethasone;Functional mobility training;Therapeutic activities;Therapeutic exercise;Balance training;Patient/family education;Neuromuscular re-education;Manual techniques;Taping;Dry needling    PT Next Visit Plan review HEP, assess response to DN, focus on hamstring strengthening    PT Home Exercise Plan Access Code: [email or web address]    Consulted and Agree with Plan of Care Patient           Patient will benefit from skilled therapeutic intervention in order to improve the following deficits and impairments:  Increased fascial restricitons,Increased muscle spasms,Pain,Decreased mobility,Decreased strength  Visit Diagnosis: Pain in right leg  Other symptoms and signs involving the musculoskeletal system  Muscle weakness (generalized)     Problem List Patient Active Problem List   Diagnosis Date Noted  . OSA (obstructive sleep apnea) 09/10/2020  . TIA (transient ischemic attack) 04/29/2020  . Essential hypertension 04/29/2020  . Hormone replacement therapy (HRT) 09/09/2016  . Hypothyroidism 07/16/2014  . Sjogren's syndrome ([place]) 04/11/2013  . History of ovarian cancer 04/11/2013  . Personal history of colonic polyps 04/11/2013  . ADD (attention deficit disorder) 07/06/2012  . Dysthymia 07/06/2012      Laureen Abrahams, PT, DPT 10/29/20 1:51 PM     [facility] Physical Therapy [address] [place], [place], [address] Phone: [phone]   Fax:  [phone]  Name: Dorothy Wood MRN: [id number] Date of Birth: 1961/05/09

## 2020-11-04 MED FILL — MYRBETRIQ ER 50 MG TABLET: 50 | 30 days supply | Qty: 30 | Fill #4

## 2020-11-05 ENCOUNTER — Other Ambulatory Visit: Payer: Self-pay

## 2020-11-05 ENCOUNTER — Ambulatory Visit: Payer: 59 | Admitting: Cardiology

## 2020-11-05 ENCOUNTER — Encounter: Payer: Self-pay | Admitting: Cardiology

## 2020-11-05 VITALS — BP 122/78 | HR 94 | Ht 65.0 in | Wt 132.2 lb

## 2020-11-05 DIAGNOSIS — I10 Essential (primary) hypertension: Secondary | ICD-10-CM | POA: Diagnosis not present

## 2020-11-05 DIAGNOSIS — E7800 Pure hypercholesterolemia, unspecified: Secondary | ICD-10-CM | POA: Diagnosis not present

## 2020-11-05 NOTE — Patient Instructions (Signed)

## 2020-11-12 ENCOUNTER — Ambulatory Visit: Payer: 59 | Admitting: Physical Therapy

## 2020-11-12 ENCOUNTER — Other Ambulatory Visit: Payer: Self-pay

## 2020-11-12 ENCOUNTER — Encounter: Payer: Self-pay | Admitting: Physical Therapy

## 2020-11-12 DIAGNOSIS — M6281 Muscle weakness (generalized): Secondary | ICD-10-CM | POA: Diagnosis not present

## 2020-11-12 DIAGNOSIS — R29898 Other symptoms and signs involving the musculoskeletal system: Secondary | ICD-10-CM | POA: Diagnosis not present

## 2020-11-12 DIAGNOSIS — M79604 Pain in right leg: Secondary | ICD-10-CM | POA: Diagnosis not present

## 2020-11-12 NOTE — Therapy (Addendum)
[facility] Physical Therapy [address] [place], [place], [address] Phone: [phone]   Fax:  [phone]  Physical Therapy Treatment/Discharge Summary  Patient Details  Name: Dorothy Wood MRN: [id number] Date of Birth: Sep 19, 1960 Referring Provider (PT): Denita Lung, MD   Encounter Date: 11/12/2020   PT End of Session - 11/12/20 1000    Visit Number 3    Number of Visits 6    Date for PT Re-Evaluation 12/09/20    Authorization Type [facility]    PT Start Time 0920    PT Stop Time 0953    PT Time Calculation (min) 33 min    Activity Tolerance Patient tolerated treatment well    Behavior During Therapy [facility] for tasks assessed/performed           Past Medical History:  Diagnosis Date  . Cancer (HCC)    OVARIAN  . Colonic polyp   . Hyperlipidemia   . Hypertension   . Sjogren's disease ([place])   . Thyroid disease    HYPOTHYROIDISM    Past Surgical History:  Procedure Laterality Date  . ABDOMINAL HYSTERECTOMY     Ovarian cancer  . BREAST CYST ASPIRATION    . CESAREAN SECTION     x2  . COLONOSCOPY  10-06   Dr. Earnest Bailey  . TONSILLECTOMY      There were no vitals filed for this visit.   Subjective Assessment - 11/12/20 0923    Subjective having some pain into the buttock as well, concerned piriformis is tight as well.    Patient Stated Goals improve pain and symptoms    Currently in Pain? No/denies                             [facility] Adult PT Treatment/Exercise - 11/12/20 0924      Knee/Hip Exercises: Stretches   Piriformis Stretch Right;2 reps;20 seconds    Piriformis Stretch Limitations pre/post DN with decreased pain following DN      Knee/Hip Exercises: Aerobic   Recumbent Bike L4 x 8 min      Manual Therapy   Manual Therapy Soft tissue mobilization    Manual therapy comments skilled palpation and monitoring of soft tissue during DN; use of percussive device Rt hamstring    Soft tissue mobilization STM and  compression to Rt hamstrings and piriformis            Trigger Point Dry Needling - 11/12/20 0959    Consent Given? Yes    Education Handout Provided Previously provided    Muscles Treated Lower Quadrant Hamstring    Muscles Treated Back/Hip Piriformis    Hamstring Response Twitch response elicited    Piriformis Response Twitch response elicited                  PT Short Term Goals - 10/29/20 1345      PT SHORT TERM GOAL #1   Title report 50% decrease in symptoms for improved function    Status Achieved    Target Date 10/14/20      PT SHORT TERM GOAL #2   Title independent with initial HEP    Status Achieved    Target Date 10/28/20             PT Long Term Goals - 10/29/20 1345      PT LONG TERM GOAL #1   Title independent with HEP    Status On-going  Target Date 12/09/20      PT LONG TERM GOAL #2   Title report decrease in pain by 75% for improved function and decreased work interruptions    Status On-going    Target Date 12/09/20      PT LONG TERM GOAL #3   Title demonstrate 5/5 Rt knee flexion strength for improved function    Status On-going    Target Date 12/09/20      PT LONG TERM GOAL #4   Title FOTO score improved to 64 for improved function    Status On-going    Target Date 12/09/20                 Plan - 11/12/20 1000    Clinical Impression Statement Pt reporting decreased pain with all functional activities at this time, and decreased pain with provoking movements following session.  Trial of percussive device with positive response and pt may purchase for home.  Will continue to benefit from PT ot maximize function.    Personal Factors and Comorbidities Comorbidity 3+    Comorbidities hx hamstring tear, ovarian cancer, CVA, HTN, sjogren's syndrome    Examination-Activity Limitations Locomotion Level;Squat;Stairs;Stand;Lift    Examination-Participation Restrictions Cleaning;Occupation;Laundry;Community Activity     Stability/Clinical Decision Making Stable/Uncomplicated    Rehab Potential Good    PT Frequency Biweekly    PT Duration 12 weeks    PT Treatment/Interventions ADLs/Self Care Home Management;Cryotherapy;Electrical Stimulation;Ultrasound;Moist Heat;Iontophoresis 46m/ml Dexamethasone;Functional mobility training;Therapeutic activities;Therapeutic exercise;Balance training;Patient/family education;Neuromuscular re-education;Manual techniques;Taping;Dry needling    PT Next Visit Plan assess response to DN, focus on hamstring strengthening; check FOTO and other goals    PT Home Exercise Plan Access Code: [email or web address]   Consulted and Agree with Plan of Care Patient           Patient will benefit from skilled therapeutic intervention in order to improve the following deficits and impairments:  Increased fascial restricitons,Increased muscle spasms,Pain,Decreased mobility,Decreased strength  Visit Diagnosis: Pain in right leg  Other symptoms and signs involving the musculoskeletal system  Muscle weakness (generalized)     Problem List Patient Active Problem List   Diagnosis Date Noted  . OSA (obstructive sleep apnea) 09/10/2020  . TIA (transient ischemic attack) 04/29/2020  . Essential hypertension 04/29/2020  . Hormone replacement therapy (HRT) 09/09/2016  . Hypothyroidism 07/16/2014  . [place] 04/11/2013  . History of ovarian cancer 04/11/2013  . Personal history of colonic polyps 04/11/2013  . ADD (attention deficit disorder) 07/06/2012  . Dysthymia 07/06/2012      SLaureen Abrahams PT, DPT 11/12/20 10:02 AM   [facility] [address] [address]Phone: [phone]  Fax:  [phone] Name: Dorothy NOGUERAMRN: [id number] of Birth: 131-Mar-1962    PHYSICAL THERAPY DISCHARGE SUMMARY  Visits from Start of Care: 3  Current functional level related to goals / functional outcomes: See above    Remaining deficits: See above   Education / Equipment: HEP, DN  Plan: Patient agrees to discharge.  Patient goals were partially met. Patient is being discharged due to being pleased with the current functional level.  ?????    SLaureen Abrahams PT, DPT 12/18/20 12:33 PM  [facility] [address] [address]Phone: [phone]  Fax:  [phone]

## 2020-11-15 DIAGNOSIS — I10 Essential (primary) hypertension: Secondary | ICD-10-CM | POA: Diagnosis not present

## 2020-11-15 DIAGNOSIS — M5412 Radiculopathy, cervical region: Secondary | ICD-10-CM | POA: Diagnosis not present

## 2020-11-15 MED FILL — LEVOTHYROXINE 88 MCG TABLET: 88 | 90 days supply | Qty: 90 | Fill #2

## 2020-11-15 MED FILL — buPROPion HCL ER (XL) 300 M: 300 | 30 days supply | Qty: 30 | Fill #3

## 2020-11-18 DIAGNOSIS — F419 Anxiety disorder, unspecified: Secondary | ICD-10-CM | POA: Diagnosis not present

## 2020-11-20 ENCOUNTER — Other Ambulatory Visit (HOSPITAL_COMMUNITY): Payer: Self-pay | Admitting: Physician Assistant

## 2020-11-20 MED FILL — guanFACINE HCL 2 MG TABS: 2 | 30 days supply | Qty: 60 | Fill #1

## 2020-11-20 MED FILL — METHYLPHENIDATE HCL 20 MG T: 20 | 90 days supply | Qty: 270 | Fill #0

## 2020-11-21 ENCOUNTER — Other Ambulatory Visit (HOSPITAL_COMMUNITY): Payer: Self-pay | Admitting: Neurosurgery

## 2020-11-21 DIAGNOSIS — M4802 Spinal stenosis, cervical region: Secondary | ICD-10-CM | POA: Diagnosis not present

## 2020-11-21 MED FILL — diazePAM 5 MG TABS: 5 | 1 days supply | Qty: 2 | Fill #0

## 2020-11-25 ENCOUNTER — Encounter: Payer: Self-pay | Admitting: Adult Health

## 2020-11-25 ENCOUNTER — Other Ambulatory Visit: Payer: Self-pay

## 2020-11-25 ENCOUNTER — Ambulatory Visit: Payer: 59 | Admitting: Adult Health

## 2020-11-25 VITALS — BP 117/70 | HR 84 | Ht 65.0 in | Wt 135.0 lb

## 2020-11-25 DIAGNOSIS — G459 Transient cerebral ischemic attack, unspecified: Secondary | ICD-10-CM | POA: Diagnosis not present

## 2020-11-25 NOTE — Patient Instructions (Signed)
Continue aspirin 81 mg daily  and atorvastatin 40 mg daily for secondary stroke prevention  Continue to follow up with PCP regarding cholesterol management  Maintain strict control of cholesterol with LDL cholesterol (bad cholesterol) goal below 70 mg/dL.        Thank you for coming to see [place] at [facility]. I hope we have been able to provide you high quality care today.  You may receive a patient satisfaction survey over the next few weeks. We would appreciate your feedback and comments so that we may continue to improve ourselves and the health of our patients.

## 2020-11-25 NOTE — Progress Notes (Signed)
[name] [address] [place]. [place] [address] [phone]       STROKE FOLLOW UP NOTE  Ms. Dorothy VARGUS Date of Birth:  1960-11-12 Medical Record Number:  [id number]   Reason for Referral:  TIA follow up    SUBJECTIVE:   CHIEF COMPLAINT:  Chief Complaint  Patient presents with  . Follow-up    RM 14 alone Pt is well, no complaints     HPI:   Today, 11/25/2020, Dorothy Wood returns for 26-month TIA follow-up unaccompanied  Doing well since prior visit without new or recurring stroke/TIA symptoms Compliant on aspirin 81 mg daily and atorvastatin 40 mg daily -denies side effects Blood pressure today 117/70  Lipid panel 07/2020 LDL 76  No concerns at this time     History provided for reference purposes only Initial visit 05/28/2020 JM: Dorothy Wood is being seen for hospital follow-up unaccompanied She has been doing well since discharge without reoccurring or new stroke/TIA symptoms She has returned back to all prior activities without difficulty including 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 female with history of HTN, HLD, Sjogren's disease, hypothyroidism who presented on 04/29/2020 with sudden onset of dizziness followed by numbness left leg and shortly after the left arm as well lasting approximately 1 hour.   Stroke work-up completed with possible right brain TIA vs anxiety (significant stress due to recent diagnosis of recurrent cancer in her husband).  Due to possible TIA, recommended aspirin and Plavix for 3 weeks followed by aspirin alone.  Hx of HTN stable.  LDL 110 initiate atorvastatin 40 mg daily.  No history or evidence of DM with A1c 5.6.  Other stroke risk  factors include EtOH use but no prior stroke history.  Evaluated by therapy and was discharged home in stable condition without therapy needs.  R brain TIA versus anxiety  Code Stroke CT head No acute abnormality.  ASPECTS 10.     CTA head R V4 diminuative. L PCA high-grade P2/P3 jxn. B PCA atherosclerosis. Mild atherosclerosis B M2  CTA neck Unremarkable, streak artifact from recent OR cervical   MRI  / MRA  Unremarkable   2D Echo EF 60-65%. No source of embolus   LDL 110  HgbA1c 5.6  VTE prophylaxis - Lovenox 40 mg sq daily   No antithrombotic prior to admission, now on aspirin 325 mg daily. Decrease aspirin to 81 and add plavix 75 x 3 weeks then aspirin alone.   Therapy recommendations:  No therapy needs  Disposition:  Return home      ROS:   14 system review of systems performed and negative with exception of no complaints  PMH:  Past Medical History:  Diagnosis Date  . Cancer (HCC)    OVARIAN  . Colonic polyp   . Hyperlipidemia   . Hypertension   . Sjogren's disease ([place])   . Thyroid disease    HYPOTHYROIDISM    PSH:  Past Surgical History:  Procedure Laterality Date  . ABDOMINAL HYSTERECTOMY     Ovarian cancer  . BREAST CYST ASPIRATION    . CESAREAN SECTION     x2  . COLONOSCOPY  10-06   Dr. Earnest Bailey  . TONSILLECTOMY  Social History:  Social History   Socioeconomic History  . Marital status: Married    Spouse name: Not on file  . Number of children: 2  . Years of education: Not on file  . Highest education level: Not on file  Occupational History  . Not on file  Tobacco Use  . Smoking status: Never Smoker  . Smokeless tobacco: Never Used  Substance and Sexual Activity  . Alcohol use: Yes    Comment: 6 glasses per week  . Drug use: No  . Sexual activity: Not Currently  Other Topics Concern  . Not on file  Social History Narrative  . Not on file   Social Determinants of Health   Financial Resource Strain: Not on file  Food  Insecurity: Not on file  Transportation Needs: Not on file  Physical Activity: Not on file  Stress: Not on file  Social Connections: Not on file  Intimate Partner Violence: Not on file    Family History:  Family History  Problem Relation Age of Onset  . Mental illness Mother   . Hypertension Mother   . Heart failure Mother   . Mental illness Sister   . Mental illness Brother   . Mental illness Maternal Uncle   . Mental illness Maternal Grandmother   . Cancer Paternal Grandmother   . CAD Father        CABG at age 44    Medications:   Current Outpatient Medications on File Prior to Visit  Medication Sig Dispense Refill  . atorvastatin (LIPITOR) 40 MG tablet TAKE 1 TABLET BY MOUTH ONCE DAILY 90 tablet 1  . buPROPion (WELLBUTRIN XL) 300 MG 24 hr tablet Take 1 tablet (300 mg total) by mouth daily. 90 tablet 3  . guanFACINE (TENEX) 2 MG tablet Take 2 mg by mouth in the morning and at bedtime.     . hydrochlorothiazide (HYDRODIURIL) 25 MG tablet Take 1 tablet (25 mg total) by mouth daily. 90 tablet 2  . levothyroxine (SYNTHROID) 88 MCG tablet Take 88 mcg by mouth daily.    . methylphenidate (RITALIN) 20 MG tablet Take 1 tablet (20 mg total) by mouth 3 (three) times daily. 270 tablet 0  . MYRBETRIQ 50 MG TB24 tablet Take 50 mg by mouth daily.    . pilocarpine (SALAGEN) 5 MG tablet Take 1 tablet (5 mg total) by mouth 4 (four) times daily. 360 tablet 1   No current facility-administered medications on file prior to visit.    Allergies:  No Known Allergies    OBJECTIVE:  Physical Exam  Vitals:   11/25/20 0850  BP: 117/70  Pulse: 84  Weight: 135 lb (61.2 kg)  Height: 5\' 5"  (1.651 m)   Body mass index is 22.47 kg/m. No exam data present  General: well developed, well nourished, pleasant middle-age Caucasian female, seated, in no evident distress Head: head normocephalic and atraumatic.   Neck: supple with no carotid or supraclavicular bruits Cardiovascular: regular rate  and rhythm, no murmurs Musculoskeletal: no deformity Skin:  no rash/petichiae Vascular:  Normal pulses all extremities   Neurologic Exam Mental Status: Awake and fully alert.   Fluent speech and language.  Oriented to place and time. Recent and remote memory intact. Attention span, concentration and fund of knowledge appropriate. Mood and affect appropriate.  Cranial Nerves: Pupils equal, briskly reactive to light. Extraocular movements full without nystagmus. Visual fields full to confrontation. Hearing intact. Facial sensation intact. Face, tongue, palate moves normally and symmetrically.  Motor:  Normal bulk and tone. Normal strength in all tested extremity muscles. Sensory.: intact to touch , pinprick , position and vibratory sensation.  Coordination: Rapid alternating movements normal in all extremities. Finger-to-nose and heel-to-shin performed accurately bilaterally. Gait and Station: Arises from chair without difficulty. Stance is normal. Gait demonstrates normal stride length and balance without use of assistive device Reflexes: 1+ and symmetric. Toes downgoing.        ASSESSMENT: ASHYRA CANTIN is a 60 y.o. year old female presented with acute onset dizziness followed by left leg numbness followed by left arm numbness lasting approximately 1 hour on 04/29/2020 with stroke work-up largely unremarkable possibly right brain TIA vs anxiety (recent recurrent cancer diagnosis and husband).  Vascular risk factors include HTN, HLD and EtOH use.      PLAN:  1. TIA:  a. No reoccurring or new symptoms.  b. Continue aspirin 81 mg daily  and atorvastatin 40 mg daily for secondary stroke prevention.   c. Discussed secondary stroke prevention measures and importance of close PCP follow up for aggressive stroke risk factor management  d. HTN: BP goal <130/90.  Well-controlled on hydrochlorothiazide per PCP e. HLD: LDL goal <70.  LDL 76 (07/2020) on atorvastatin 40 mg daily per  PCP    Overall stable from stroke standpoint followed routinely by PCP and recommend follow-up on an as-needed basis   CC:  [facility] provider: Dr. Jomarie Longs, Elyse Jarvis, MD    I spent 25 minutes of face-to-face and non-face-to-face time with patient.  This included previsit chart review, lab review, study review, order entry, electronic health record documentation, patient education regarding recent TIA, secondary stroke prevention measures and importance of aggressive stroke risk factor management and answered all other questions to patient satisfaction  Frann Rider, [facility]  [facility] [address] [place], [place] [address]  Phone [phone] Fax [phone] Note: This document was prepared with digital dictation and possible smart phrase technology. Any transcriptional errors that result from this process are unintentional.

## 2020-12-01 NOTE — Progress Notes (Signed)
I agree with the above plan 

## 2020-12-03 MED FILL — LORAZEPAM 1 MG TABS: 1 | 15 days supply | Qty: 15 | Fill #0

## 2020-12-03 MED FILL — ATORVASTATIN 40 MG TABLET: 40 | 90 days supply | Qty: 90 | Fill #1

## 2020-12-05 ENCOUNTER — Other Ambulatory Visit: Payer: Self-pay

## 2020-12-05 ENCOUNTER — Ambulatory Visit
Admission: RE | Admit: 2020-12-05 | Discharge: 2020-12-05 | Disposition: A | Discharge status: Home / Self Care | Payer: 59 | Source: Ambulatory Visit | Attending: Obstetrics & Gynecology | Admitting: Obstetrics & Gynecology

## 2020-12-05 DIAGNOSIS — Z1231 Encounter for screening mammogram for malignant neoplasm of breast: Secondary | ICD-10-CM | POA: Diagnosis not present

## 2020-12-05 LAB — HM MAMMOGRAPHY

## 2020-12-05 IMAGING — MG MM DIGITAL SCREENING BILAT W/ TOMO AND CAD
6 of 10 series · 6 of 30 positions shown · non-contrast
Comparison: Previous exam(s).

CLINICAL DATA: Screening.

EXAM:
DIGITAL SCREENING BILATERAL MAMMOGRAM WITH TOMOSYNTHESIS AND CAD
TECHNIQUE: Bilateral screening digital craniocaudal and mediolateral oblique
mammograms were obtained. Bilateral screening digital breast
tomosynthesis was performed. The images were evaluated with
computer-aided detection.

[L CC synth-2D]
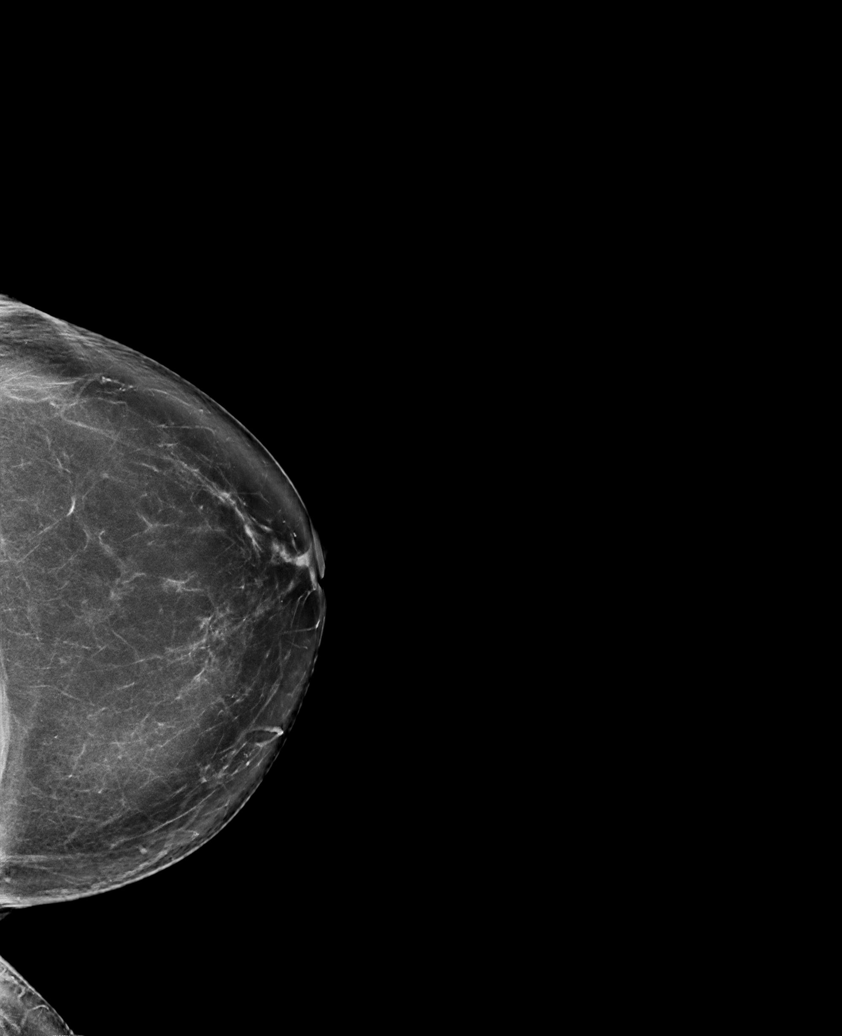

[R CC synth-2D]
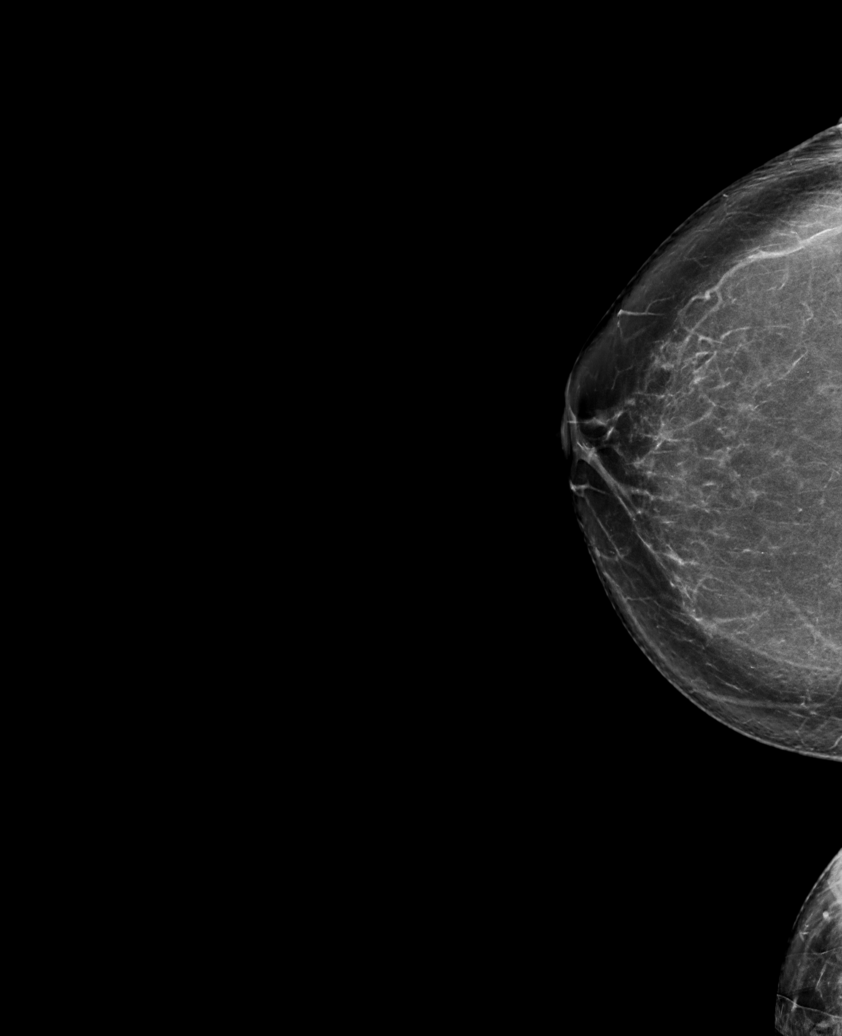

[L MLO synth-2D]
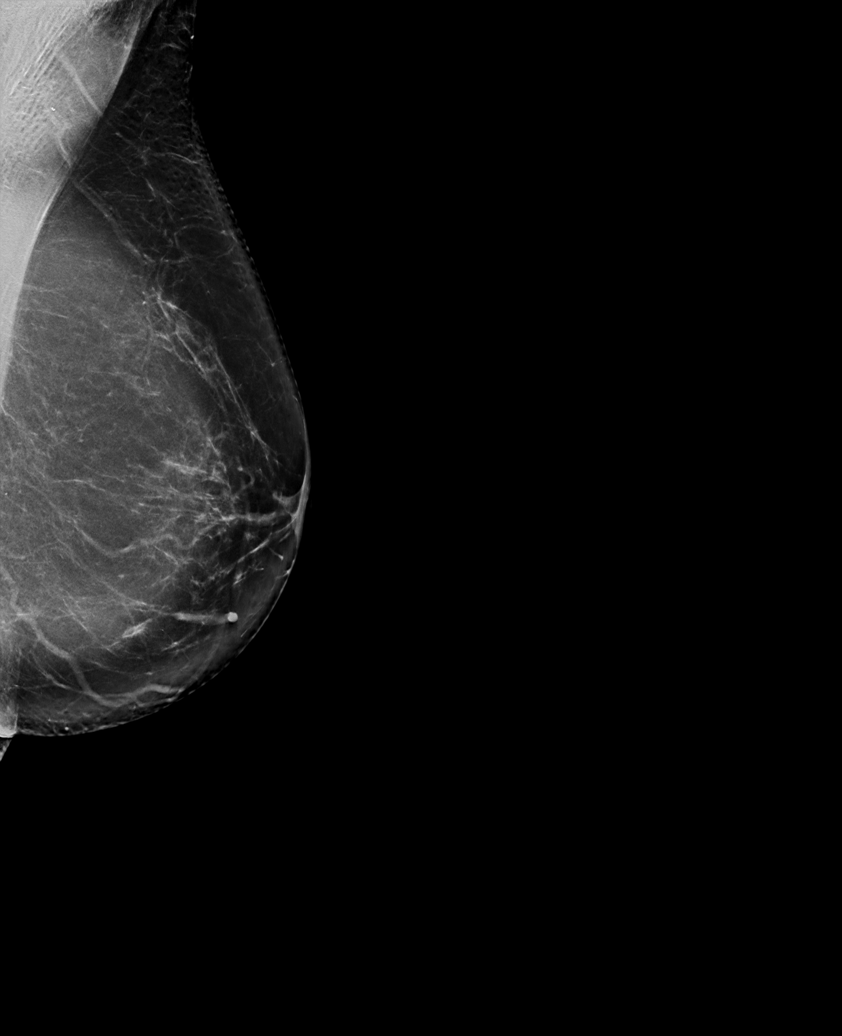

[R MLO synth-2D (1 of 2)]
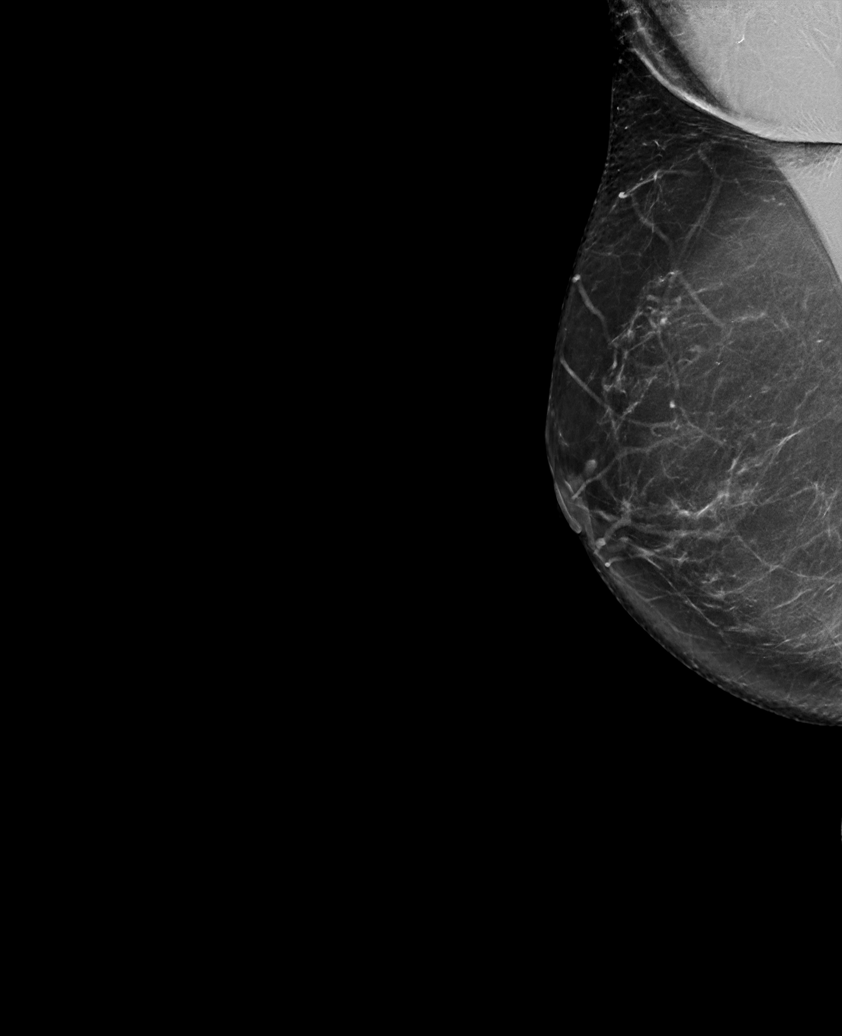

[R MLO synth-2D (2 of 2)]
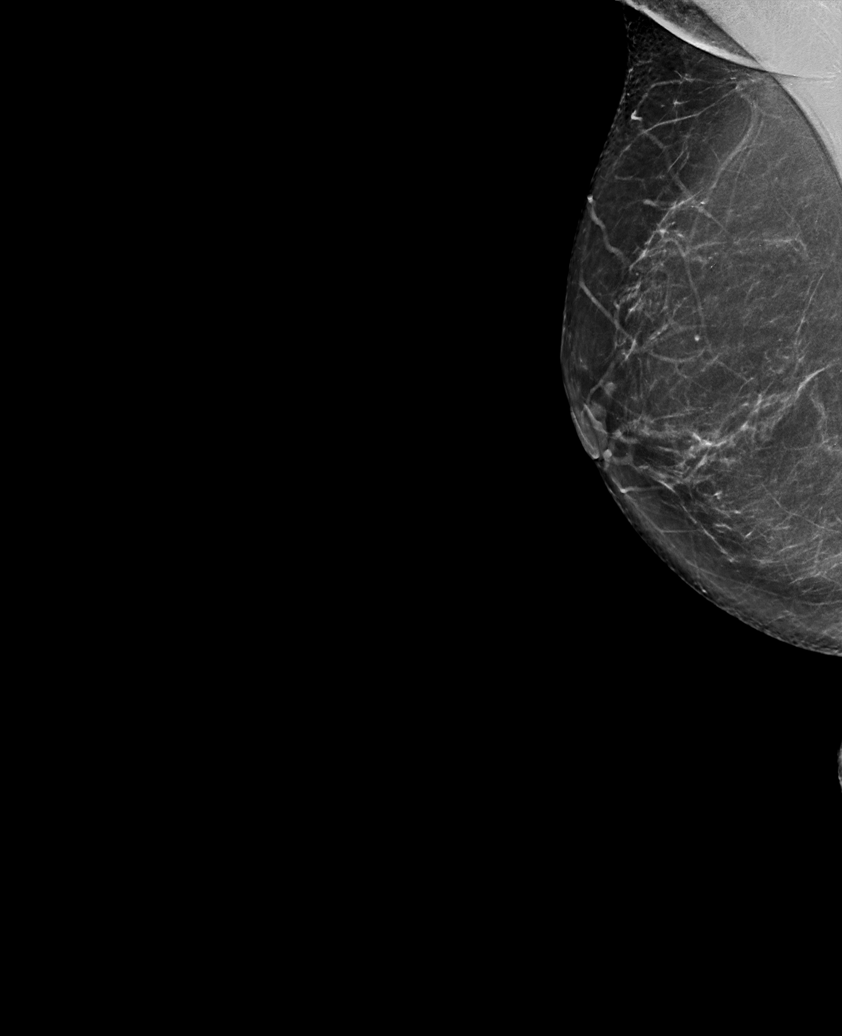

[R MLO tomo · tomo slice 41/80.0]
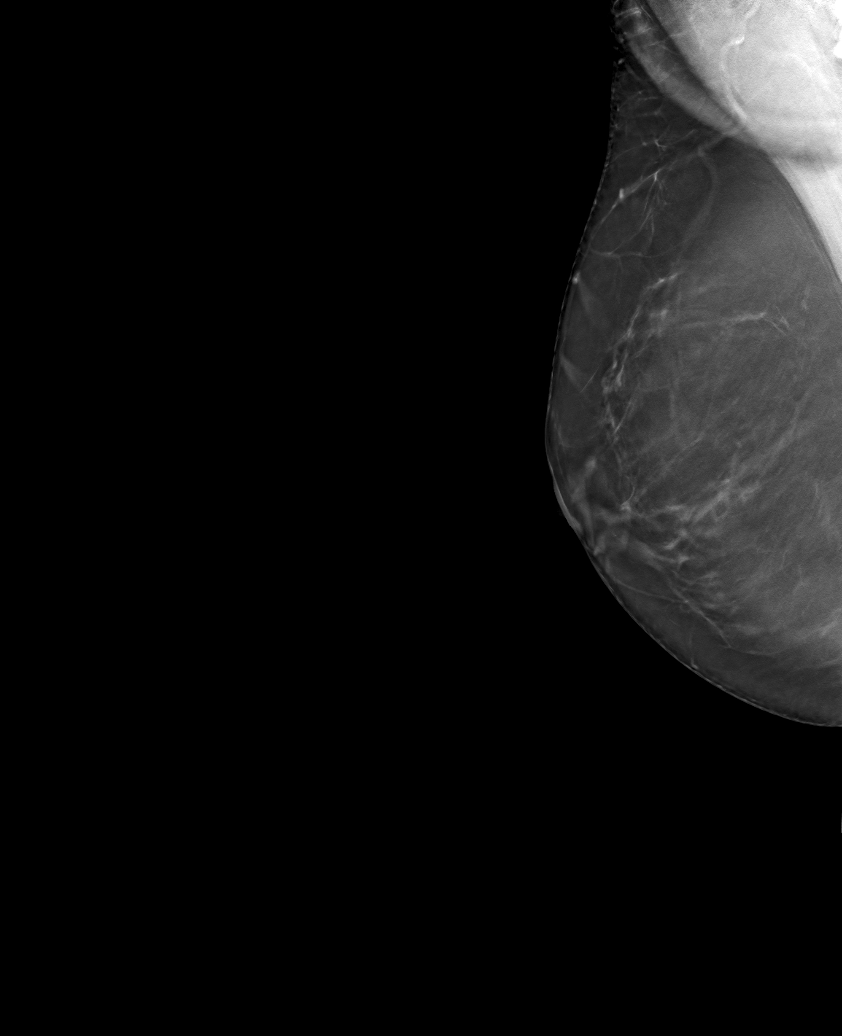

[6 of 30 positions shown; findings below may reference images not displayed]

ACR Breast Density Category b: There are scattered areas of
fibroglandular density.
FINDINGS: There are no findings suspicious for malignancy. The images were
evaluated with computer-aided detection.
IMPRESSION: No mammographic evidence of malignancy. A result letter of this
screening mammogram will be mailed directly to the patient.

RECOMMENDATION:
Screening mammogram in one year. (Code:[OD])

BI-RADS CATEGORY  1: Negative.

## 2020-12-06 ENCOUNTER — Other Ambulatory Visit (HOSPITAL_BASED_OUTPATIENT_CLINIC_OR_DEPARTMENT_OTHER): Payer: Self-pay

## 2020-12-09 ENCOUNTER — Telehealth: Payer: Self-pay

## 2020-12-09 ENCOUNTER — Other Ambulatory Visit: Payer: Self-pay | Admitting: Family Medicine

## 2020-12-09 DIAGNOSIS — M3500 Sicca syndrome, unspecified: Secondary | ICD-10-CM

## 2020-12-09 MED ORDER — PILOCARPINE HCL 5 MG PO TABS
5.0000 mg | ORAL_TABLET | Freq: Four times a day (QID) | ORAL | 1 refills | Status: DC
Start: 2020-12-09 — End: 2020-12-09

## 2020-12-09 MED FILL — PILOCARPINE HCL 5 MG TABLET: 5 | 90 days supply | Qty: 360 | Fill #0

## 2020-12-09 MED FILL — buPROPion HCL ER (XL) 300 M: 300 | 30 days supply | Qty: 30 | Fill #1

## 2020-12-09 NOTE — Telephone Encounter (Signed)
Got a fax requesting to fill pt pilocarpine. Please advise [facility]

## 2020-12-09 NOTE — Telephone Encounter (Signed)
received fax for a refill on the pts. Pilocarpine pt. Last apt was 10/09/20.

## 2020-12-10 DIAGNOSIS — M47812 Spondylosis without myelopathy or radiculopathy, cervical region: Secondary | ICD-10-CM | POA: Diagnosis not present

## 2020-12-12 ENCOUNTER — Encounter: Payer: Self-pay | Admitting: Family Medicine

## 2020-12-24 ENCOUNTER — Other Ambulatory Visit (HOSPITAL_COMMUNITY): Payer: Self-pay

## 2020-12-24 DIAGNOSIS — F902 Attention-deficit hyperactivity disorder, combined type: Secondary | ICD-10-CM | POA: Diagnosis not present

## 2020-12-24 DIAGNOSIS — F419 Anxiety disorder, unspecified: Secondary | ICD-10-CM | POA: Diagnosis not present

## 2020-12-24 MED FILL — Guanfacine HCl Tab 2 MG: ORAL | 30 days supply | Qty: 60 | Fill #0 | Status: AC

## 2020-12-24 MED FILL — Mirabegron Tab ER 24 HR 50 MG: ORAL | 30 days supply | Qty: 30 | Fill #0 | Status: AC

## 2020-12-29 MED FILL — Lorazepam Tab 1 MG: ORAL | 15 days supply | Qty: 15 | Fill #0 | Status: AC

## 2020-12-30 ENCOUNTER — Other Ambulatory Visit (HOSPITAL_COMMUNITY): Payer: Self-pay

## 2020-12-31 ENCOUNTER — Other Ambulatory Visit (HOSPITAL_COMMUNITY): Payer: Self-pay

## 2021-01-01 DIAGNOSIS — H524 Presbyopia: Secondary | ICD-10-CM | POA: Diagnosis not present

## 2021-01-01 DIAGNOSIS — H52223 Regular astigmatism, bilateral: Secondary | ICD-10-CM | POA: Diagnosis not present

## 2021-01-01 DIAGNOSIS — H5203 Hypermetropia, bilateral: Secondary | ICD-10-CM | POA: Diagnosis not present

## 2021-01-16 ENCOUNTER — Other Ambulatory Visit (HOSPITAL_COMMUNITY): Payer: Self-pay

## 2021-01-16 MED FILL — Bupropion HCl Tab ER 24HR 300 MG: ORAL | 30 days supply | Qty: 30 | Fill #0 | Status: AC

## 2021-01-16 MED FILL — Lorazepam Tab 1 MG: ORAL | 15 days supply | Qty: 15 | Fill #1 | Status: AC

## 2021-01-20 ENCOUNTER — Other Ambulatory Visit (HOSPITAL_COMMUNITY): Payer: Self-pay

## 2021-01-21 DIAGNOSIS — M47812 Spondylosis without myelopathy or radiculopathy, cervical region: Secondary | ICD-10-CM | POA: Diagnosis not present

## 2021-02-02 ENCOUNTER — Inpatient Hospital Stay (HOSPITAL_COMMUNITY)
Admission: EM | Admit: 2021-02-02 | Discharge: 2021-02-05 | DRG: 392 | Disposition: A | Discharge status: Home / Self Care | Payer: 59 | Attending: Internal Medicine | Admitting: Internal Medicine

## 2021-02-02 ENCOUNTER — Emergency Department (HOSPITAL_COMMUNITY): Payer: 59

## 2021-02-02 ENCOUNTER — Other Ambulatory Visit: Payer: Self-pay

## 2021-02-02 ENCOUNTER — Encounter (HOSPITAL_COMMUNITY): Payer: Self-pay | Admitting: Emergency Medicine

## 2021-02-02 DIAGNOSIS — K2970 Gastritis, unspecified, without bleeding: Secondary | ICD-10-CM | POA: Diagnosis not present

## 2021-02-02 DIAGNOSIS — Z90710 Acquired absence of both cervix and uterus: Secondary | ICD-10-CM

## 2021-02-02 DIAGNOSIS — G459 Transient cerebral ischemic attack, unspecified: Secondary | ICD-10-CM | POA: Diagnosis not present

## 2021-02-02 DIAGNOSIS — Z20822 Contact with and (suspected) exposure to covid-19: Secondary | ICD-10-CM | POA: Diagnosis present

## 2021-02-02 DIAGNOSIS — R110 Nausea: Secondary | ICD-10-CM | POA: Diagnosis not present

## 2021-02-02 DIAGNOSIS — R1111 Vomiting without nausea: Secondary | ICD-10-CM | POA: Diagnosis not present

## 2021-02-02 DIAGNOSIS — E785 Hyperlipidemia, unspecified: Secondary | ICD-10-CM | POA: Diagnosis not present

## 2021-02-02 DIAGNOSIS — Z8719 Personal history of other diseases of the digestive system: Secondary | ICD-10-CM | POA: Diagnosis not present

## 2021-02-02 DIAGNOSIS — I444 Left anterior fascicular block: Secondary | ICD-10-CM | POA: Diagnosis present

## 2021-02-02 DIAGNOSIS — R0902 Hypoxemia: Secondary | ICD-10-CM | POA: Diagnosis not present

## 2021-02-02 DIAGNOSIS — E872 Acidosis: Secondary | ICD-10-CM | POA: Diagnosis not present

## 2021-02-02 DIAGNOSIS — K2100 Gastro-esophageal reflux disease with esophagitis, without bleeding: Principal | ICD-10-CM | POA: Diagnosis present

## 2021-02-02 DIAGNOSIS — M3500 Sicca syndrome, unspecified: Secondary | ICD-10-CM | POA: Diagnosis not present

## 2021-02-02 DIAGNOSIS — Z809 Family history of malignant neoplasm, unspecified: Secondary | ICD-10-CM

## 2021-02-02 DIAGNOSIS — I700 Atherosclerosis of aorta: Secondary | ICD-10-CM | POA: Diagnosis not present

## 2021-02-02 DIAGNOSIS — E039 Hypothyroidism, unspecified: Secondary | ICD-10-CM | POA: Diagnosis present

## 2021-02-02 DIAGNOSIS — Z8673 Personal history of transient ischemic attack (TIA), and cerebral infarction without residual deficits: Secondary | ICD-10-CM | POA: Diagnosis not present

## 2021-02-02 DIAGNOSIS — E8729 Other acidosis: Secondary | ICD-10-CM

## 2021-02-02 DIAGNOSIS — K222 Esophageal obstruction: Secondary | ICD-10-CM | POA: Diagnosis present

## 2021-02-02 DIAGNOSIS — Z7982 Long term (current) use of aspirin: Secondary | ICD-10-CM | POA: Diagnosis not present

## 2021-02-02 DIAGNOSIS — Z8249 Family history of ischemic heart disease and other diseases of the circulatory system: Secondary | ICD-10-CM

## 2021-02-02 DIAGNOSIS — I10 Essential (primary) hypertension: Secondary | ICD-10-CM | POA: Diagnosis present

## 2021-02-02 DIAGNOSIS — Z8543 Personal history of malignant neoplasm of ovary: Secondary | ICD-10-CM | POA: Diagnosis not present

## 2021-02-02 DIAGNOSIS — Z79899 Other long term (current) drug therapy: Secondary | ICD-10-CM

## 2021-02-02 DIAGNOSIS — E876 Hypokalemia: Secondary | ICD-10-CM | POA: Diagnosis present

## 2021-02-02 DIAGNOSIS — R109 Unspecified abdominal pain: Secondary | ICD-10-CM | POA: Diagnosis not present

## 2021-02-02 DIAGNOSIS — R112 Nausea with vomiting, unspecified: Secondary | ICD-10-CM | POA: Diagnosis not present

## 2021-02-02 DIAGNOSIS — Z818 Family history of other mental and behavioral disorders: Secondary | ICD-10-CM | POA: Diagnosis not present

## 2021-02-02 DIAGNOSIS — M3508 Sjogren syndrome with gastrointestinal involvement: Secondary | ICD-10-CM

## 2021-02-02 DIAGNOSIS — R9431 Abnormal electrocardiogram [ECG] [EKG]: Secondary | ICD-10-CM | POA: Diagnosis not present

## 2021-02-02 DIAGNOSIS — Z79890 Hormone replacement therapy (postmenopausal): Secondary | ICD-10-CM | POA: Diagnosis not present

## 2021-02-02 DIAGNOSIS — K449 Diaphragmatic hernia without obstruction or gangrene: Secondary | ICD-10-CM | POA: Diagnosis not present

## 2021-02-02 DIAGNOSIS — R140 Abdominal distension (gaseous): Secondary | ICD-10-CM | POA: Diagnosis not present

## 2021-02-02 DIAGNOSIS — K3189 Other diseases of stomach and duodenum: Secondary | ICD-10-CM | POA: Diagnosis not present

## 2021-02-02 DIAGNOSIS — R1013 Epigastric pain: Secondary | ICD-10-CM | POA: Diagnosis not present

## 2021-02-02 DIAGNOSIS — K5730 Diverticulosis of large intestine without perforation or abscess without bleeding: Secondary | ICD-10-CM | POA: Diagnosis not present

## 2021-02-02 DIAGNOSIS — K219 Gastro-esophageal reflux disease without esophagitis: Secondary | ICD-10-CM | POA: Diagnosis not present

## 2021-02-02 LAB — URINALYSIS, ROUTINE W REFLEX MICROSCOPIC
Bilirubin Urine: NEGATIVE
Glucose, UA: NEGATIVE mg/dL
Hgb urine dipstick: NEGATIVE
Ketones, ur: 80 mg/dL — AB
Leukocytes,Ua: NEGATIVE
Nitrite: NEGATIVE
Protein, ur: NEGATIVE mg/dL
Specific Gravity, Urine: 1.017 (ref 1.005–1.030)
pH: 9 — ABNORMAL HIGH (ref 5.0–8.0)

## 2021-02-02 LAB — BASIC METABOLIC PANEL
Anion gap: 11 (ref 5–15)
BUN: 14 mg/dL (ref 6–20)
CO2: 19 mmol/L — ABNORMAL LOW (ref 22–32)
Calcium: 8.9 mg/dL (ref 8.9–10.3)
Chloride: 105 mmol/L (ref 98–111)
Creatinine, Ser: 0.88 mg/dL (ref 0.44–1.00)
GFR, Estimated: >60 mL/min (ref >60)
Glucose, Bld: 110 mg/dL — ABNORMAL HIGH (ref 70–99)
Potassium: 3.7 mmol/L (ref 3.5–5.1)
Sodium: 135 mmol/L (ref 135–145)

## 2021-02-02 LAB — COMPREHENSIVE METABOLIC PANEL
ALT: 82 U/L — ABNORMAL HIGH (ref 0–44)
AST: 50 U/L — ABNORMAL HIGH (ref 15–41)
Albumin: 5 g/dL (ref 3.5–5.0)
Alkaline Phosphatase: 106 U/L (ref 38–126)
Anion gap: 18 — ABNORMAL HIGH (ref 5–15)
BUN: 19 mg/dL (ref 6–20)
CO2: 20 mmol/L — ABNORMAL LOW (ref 22–32)
Calcium: 10.4 mg/dL — ABNORMAL HIGH (ref 8.9–10.3)
Chloride: 98 mmol/L (ref 98–111)
Creatinine, Ser: 0.94 mg/dL (ref 0.44–1.00)
GFR, Estimated: >60 mL/min (ref >60)
Glucose, Bld: 146 mg/dL — ABNORMAL HIGH (ref 70–99)
Potassium: 3 mmol/L — ABNORMAL LOW (ref 3.5–5.1)
Sodium: 136 mmol/L (ref 135–145)
Total Bilirubin: 1.1 mg/dL (ref 0.3–1.2)
Total Protein: 8.1 g/dL (ref 6.5–8.1)

## 2021-02-02 LAB — TROPONIN I (HIGH SENSITIVITY): Troponin I (High Sensitivity): 3 ng/L (ref <18)

## 2021-02-02 LAB — CBC
HCT: 41 % (ref 36.0–46.0)
Hemoglobin: 14.1 g/dL (ref 12.0–15.0)
MCH: 30.1 pg (ref 26.0–34.0)
MCHC: 34.4 g/dL (ref 30.0–36.0)
MCV: 87.6 fL (ref 80.0–100.0)
Platelets: 324 10*3/uL (ref 150–400)
RBC: 4.68 MIL/uL (ref 3.87–5.11)
RDW: 11.9 % (ref 11.5–15.5)
WBC: 11.2 10*3/uL — ABNORMAL HIGH (ref 4.0–10.5)
nRBC: 0 % (ref 0.0–0.2)

## 2021-02-02 LAB — PHOSPHORUS: Phosphorus: 2.6 mg/dL (ref 2.5–4.6)

## 2021-02-02 LAB — RESP PANEL BY RT-PCR (FLU A&B, COVID) ARPGX2
Influenza A by PCR: NEGATIVE
Influenza B by PCR: NEGATIVE
SARS Coronavirus 2 by RT PCR: NEGATIVE

## 2021-02-02 LAB — MAGNESIUM: Magnesium: 1.6 mg/dL — ABNORMAL LOW (ref 1.7–2.4)

## 2021-02-02 LAB — LIPASE, BLOOD: Lipase: 24 U/L (ref 11–51)

## 2021-02-02 IMAGING — CT CT ABD-PELV W/O CM
2 of 4 series · 16 of 46 positions shown, 18 images · non-contrast
Comparison: [DATE]

CLINICAL DATA: Epigastric pain, vomiting since yesterday

EXAM:
CT ABDOMEN AND PELVIS WITHOUT CONTRAST
TECHNIQUE: Multidetector CT imaging of the abdomen and pelvis was performed
following the standard protocol without IV contrast.

[Series 2: axial st · axial · 0.83mm/px · z∈[+1023,+1403]mm · 13 of 86 slices shown, 15 images]
[im 5/86  soft-tissue]
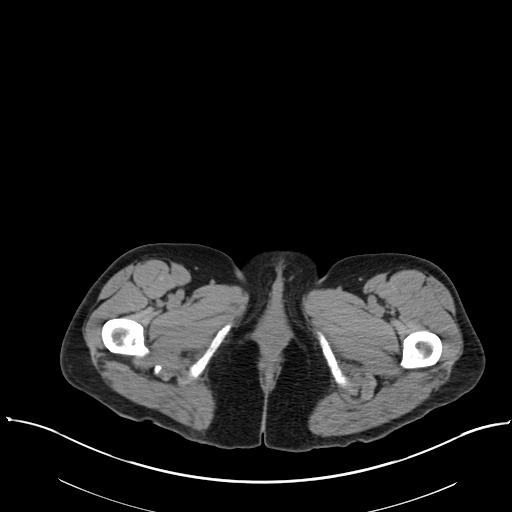
[im 5/86  bone]
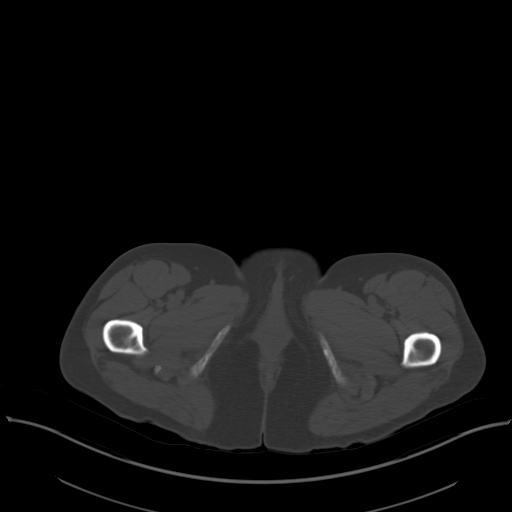
[im 13/86  soft-tissue]
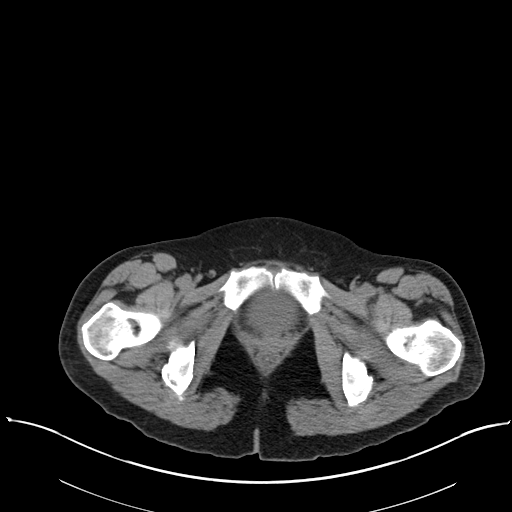
[im 18/86  soft-tissue]
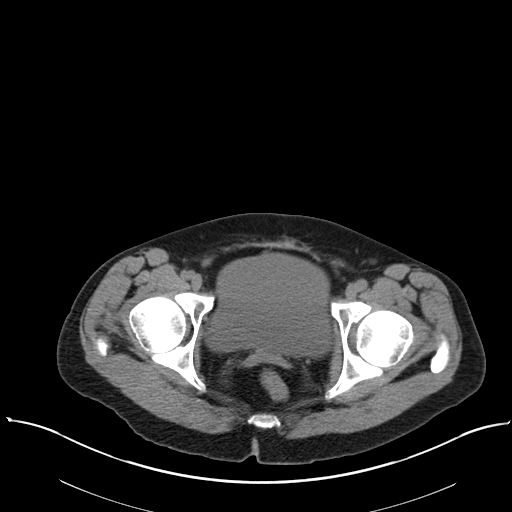
[im 26/86  soft-tissue]
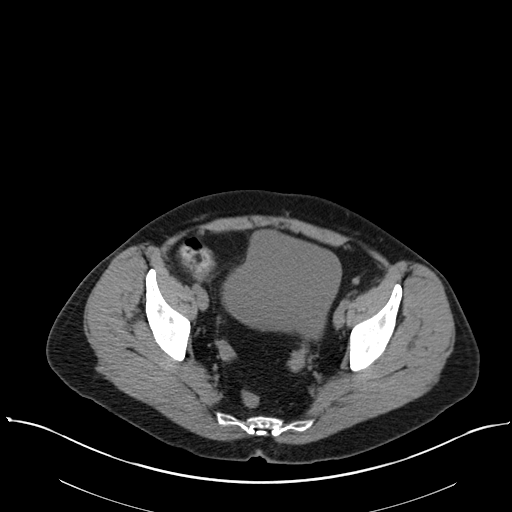
[im 30/86  soft-tissue]
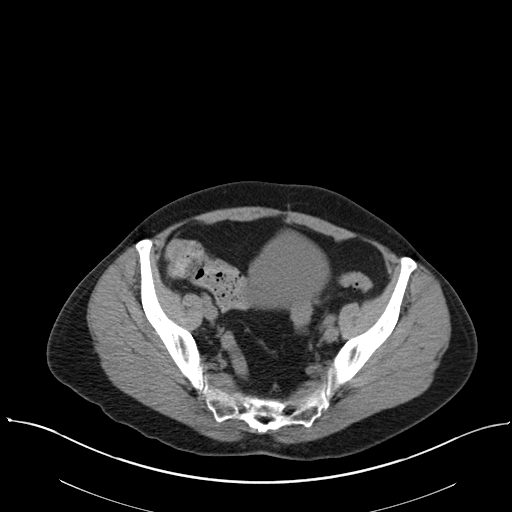
[im 39/86  soft-tissue]
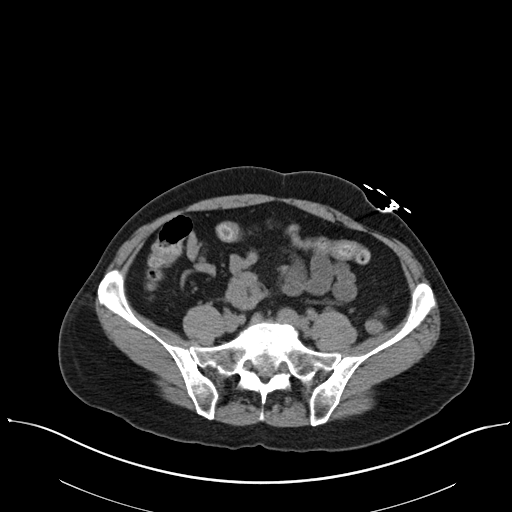
[im 43/86  soft-tissue]
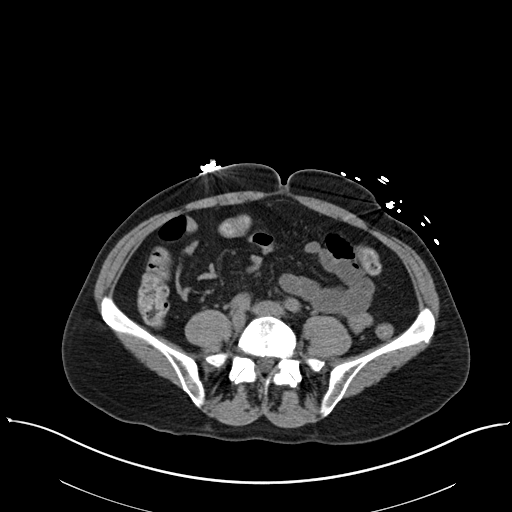
[im 47/86  soft-tissue]
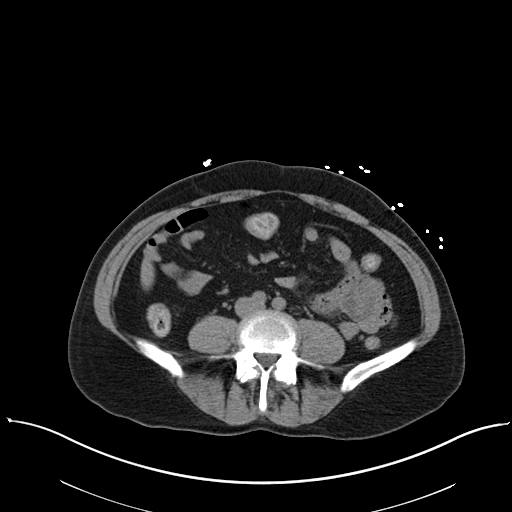
[im 56/86  soft-tissue]
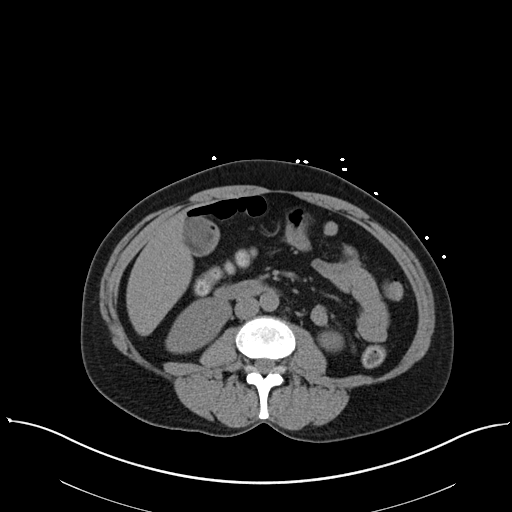
[im 56/86  bone]
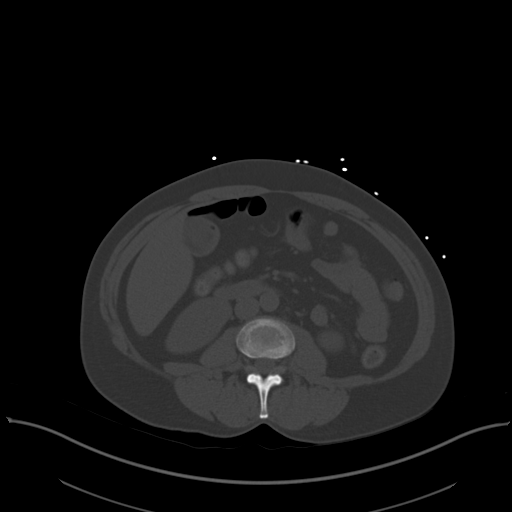
[im 60/86  soft-tissue]
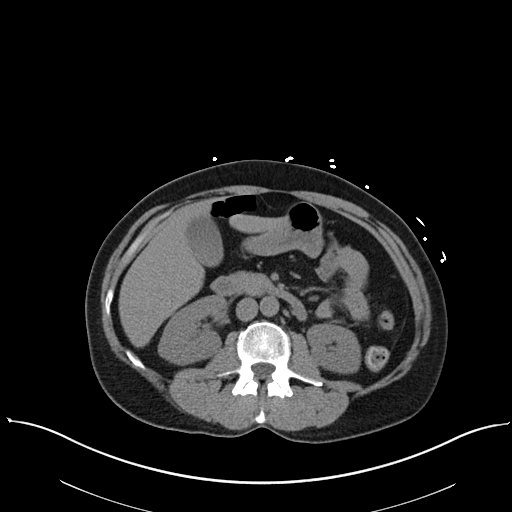
[im 69/86  soft-tissue]
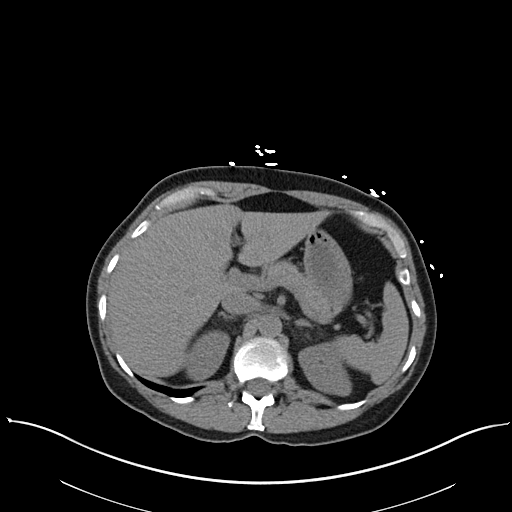
[im 73/86  soft-tissue]
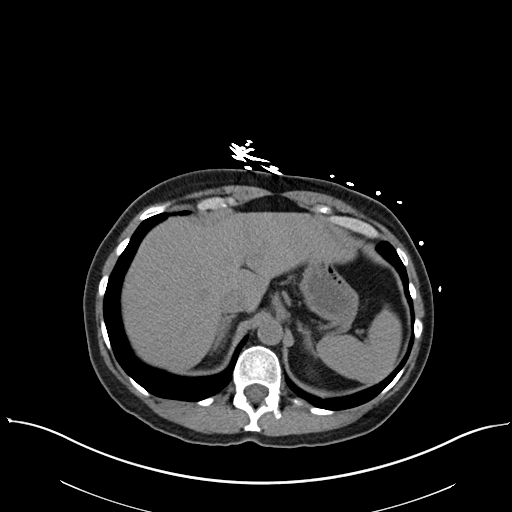
[im 81/86  soft-tissue]
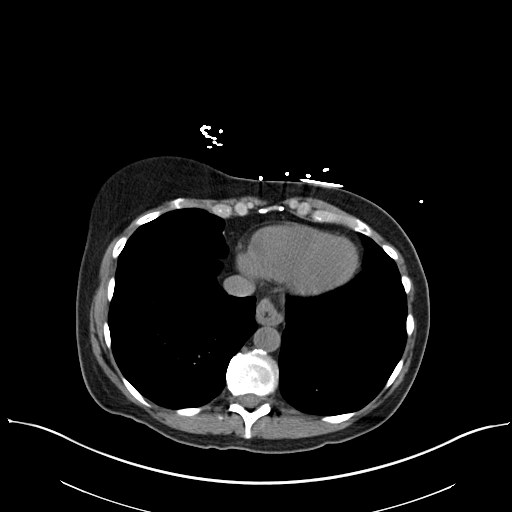

[Series 5: coronal st · coronal · 0.86mm/px · 3 of 130 slices shown]
[im 44/130  soft-tissue]
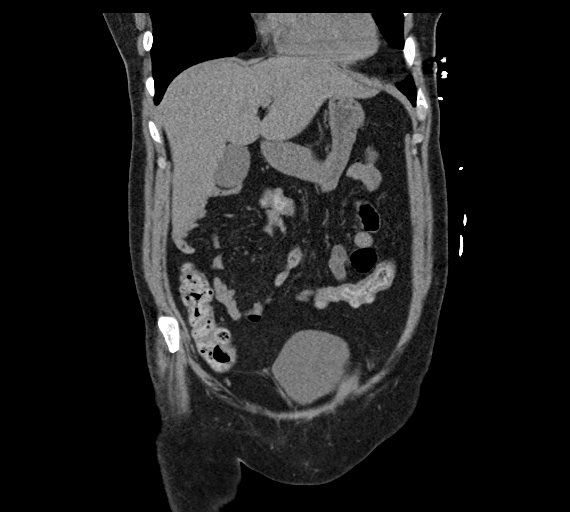
[im 58/130  soft-tissue]
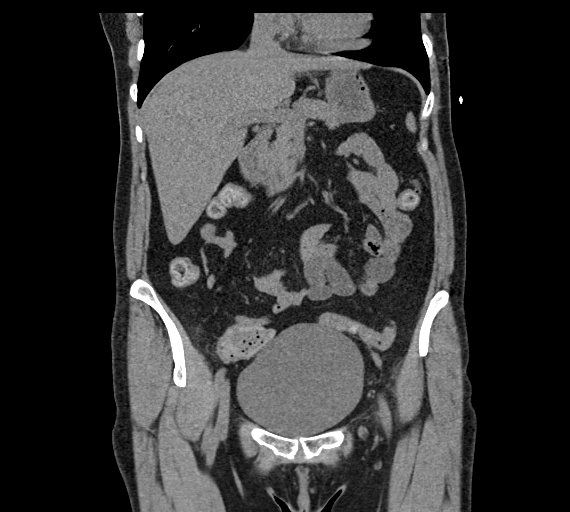
[im 72/130  soft-tissue]
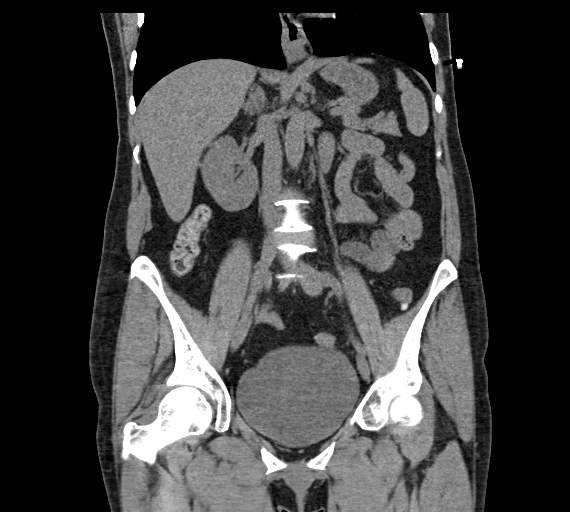

[16 of 46 positions shown; findings below may reference images not displayed]

FINDINGS: Lower chest: No acute abnormality.  Small hiatal hernia.

Hepatobiliary: No solid liver abnormality is seen. No gallstones,
gallbladder wall thickening, or biliary dilatation.

Pancreas: Unremarkable. No pancreatic ductal dilatation or
surrounding inflammatory changes.

Spleen: Normal in size without significant abnormality.

Adrenals/Urinary Tract: Adrenal glands are unremarkable. Kidneys are
normal, without renal calculi, solid lesion, or hydronephrosis.
Bladder is unremarkable.

Stomach/Bowel: Stomach is within normal limits. Appendix appears
normal. No evidence of bowel wall thickening, distention, or
inflammatory changes. Occasional descending and sigmoid diverticula.

Vascular/Lymphatic: Scattered aortic atherosclerosis. No enlarged
abdominal or pelvic lymph nodes.

Reproductive: Status post hysterectomy.

Other: No abdominal wall hernia or abnormality. No abdominopelvic
ascites.

Musculoskeletal: No acute or significant osseous findings.
IMPRESSION: 1. No non-contrast CT findings of the abdomen or pelvis to explain
epigastric pain, nausea, or vomiting. No evidence of bowel
obstruction.
2. Small hiatal hernia.
3. Occasional descending and sigmoid diverticula without evidence of
acute diverticulitis.
4. Status post hysterectomy.

Aortic Atherosclerosis ([Z0]-[Z0]).

## 2021-02-02 MED ORDER — ONDANSETRON HCL 4 MG/2ML IJ SOLN
4.0000 mg | Freq: Once | INTRAMUSCULAR | Status: AC
  Administered 2021-02-02: 4 mg via INTRAVENOUS
  Filled 2021-02-02: qty 2

## 2021-02-02 MED ORDER — PANTOPRAZOLE SODIUM 40 MG IV SOLR
40.0000 mg | Freq: Once | INTRAVENOUS | Status: AC
  Administered 2021-02-02: 40 mg via INTRAVENOUS
  Filled 2021-02-02: qty 40

## 2021-02-02 MED ORDER — POTASSIUM CHLORIDE IN NACL 20-0.9 MEQ/L-% IV SOLN
Freq: Once | INTRAVENOUS | Status: AC
  Filled 2021-02-02: qty 1000

## 2021-02-02 MED ORDER — ALUM & MAG HYDROXIDE-SIMETH 200-200-20 MG/5ML PO SUSP
30.0000 mL | Freq: Once | ORAL | Status: AC
  Administered 2021-02-02: 30 mL via ORAL
  Filled 2021-02-02: qty 30

## 2021-02-02 MED ORDER — SODIUM CHLORIDE 0.9 % IV BOLUS
1000.0000 mL | Freq: Once | INTRAVENOUS | Status: AC
  Administered 2021-02-02: 1000 mL via INTRAVENOUS

## 2021-02-02 MED ORDER — LORAZEPAM 2 MG/ML IJ SOLN
1.0000 mg | INTRAMUSCULAR | Status: DC | PRN
  Administered 2021-02-02 – 2021-02-03 (×2): 1 mg via INTRAVENOUS
  Filled 2021-02-02 (×2): qty 1

## 2021-02-02 MED ORDER — LIDOCAINE VISCOUS HCL 2 % MT SOLN
15.0000 mL | Freq: Once | OROMUCOSAL | Status: AC
  Administered 2021-02-02: 15 mL via ORAL
  Filled 2021-02-02: qty 15

## 2021-02-02 MED ORDER — ENOXAPARIN SODIUM 40 MG/0.4ML IJ SOSY
40.0000 mg | PREFILLED_SYRINGE | INTRAMUSCULAR | Status: DC
  Administered 2021-02-02 – 2021-02-04 (×2): 40 mg via SUBCUTANEOUS
  Filled 2021-02-02 (×3): qty 0.4

## 2021-02-02 MED ORDER — THIAMINE HCL 100 MG/ML IJ SOLN
100.0000 mg | Freq: Every day | INTRAMUSCULAR | Status: DC
  Administered 2021-02-03: 100 mg via INTRAVENOUS
  Filled 2021-02-02: qty 2

## 2021-02-02 MED ORDER — METOCLOPRAMIDE HCL 5 MG/ML IJ SOLN
10.0000 mg | Freq: Once | INTRAMUSCULAR | Status: AC
  Administered 2021-02-02: 10 mg via INTRAVENOUS
  Filled 2021-02-02: qty 2

## 2021-02-02 MED ORDER — MAGNESIUM SULFATE 4 GM/100ML IV SOLN
4.0000 g | Freq: Once | INTRAVENOUS | Status: AC
  Administered 2021-02-02: 4 g via INTRAVENOUS
  Filled 2021-02-02: qty 100

## 2021-02-02 MED ORDER — FENTANYL CITRATE (PF) 100 MCG/2ML IJ SOLN
50.0000 ug | Freq: Once | INTRAMUSCULAR | Status: AC
Start: 2021-02-02 — End: 2021-02-02
  Administered 2021-02-02: 50 ug via INTRAVENOUS
  Filled 2021-02-02: qty 2

## 2021-02-02 MED ORDER — LORAZEPAM 1 MG PO TABS: 1.0000 mg | ORAL_TABLET | ORAL | Status: DC | PRN

## 2021-02-02 MED ORDER — THIAMINE HCL 100 MG PO TABS
100.0000 mg | ORAL_TABLET | Freq: Every day | ORAL | Status: DC
  Administered 2021-02-02 – 2021-02-04 (×2): 100 mg via ORAL
  Filled 2021-02-02 (×2): qty 1

## 2021-02-02 MED ORDER — SODIUM CHLORIDE 0.9 % IV SOLN
12.5000 mg | Freq: Four times a day (QID) | INTRAVENOUS | Status: DC | PRN
  Administered 2021-02-02: 12.5 mg via INTRAVENOUS
  Filled 2021-02-02: qty 0.5
  Filled 2021-02-02: qty 12.5

## 2021-02-02 MED ORDER — DIPHENHYDRAMINE HCL 50 MG/ML IJ SOLN
12.5000 mg | Freq: Once | INTRAMUSCULAR | Status: AC
  Administered 2021-02-02: 12.5 mg via INTRAVENOUS
  Filled 2021-02-02: qty 1

## 2021-02-02 MED ORDER — PANTOPRAZOLE SODIUM 40 MG IV SOLR
40.0000 mg | INTRAVENOUS | Status: DC
  Administered 2021-02-03: 40 mg via INTRAVENOUS
  Filled 2021-02-02: qty 40

## 2021-02-02 MED ORDER — POTASSIUM CHLORIDE CRYS ER 20 MEQ PO TBCR
40.0000 meq | EXTENDED_RELEASE_TABLET | Freq: Once | ORAL | Status: AC
  Administered 2021-02-02: 40 meq via ORAL
  Filled 2021-02-02: qty 2

## 2021-02-02 NOTE — ED Provider Notes (Signed)
[facility] DEPT Provider Note   CSN: [id number] Arrival date & time: 02/02/21  [id number]     History Chief Complaint  Patient presents with  . Abdominal Pain    Dorothy Wood is a 60 y.o. female with pertinent medical history of Sjogren's, hypothyroidism, hyperlipidemia, hypertension that presents emergency department today for abdominal pain nausea vomiting.  Patient states that this started around 11 yesterday, has progressively worsened to the point where she cannot eat or drink anything.  Patient states that she had a normal bowel movement yesterday, no diarrhea.  Patient states that she has a burning sensation in her chest, does have a history of acid reflux, does take 20 Protonix daily.  States that she has been compliant with this.  Patient also states that she has a colonic stricture, is taking MiraLAX for this.  Denies any chest pain or shortness of breath besides the chest burning sensation in her epigastrium.  Denies any substance use or alcohol use.  Patient states that she has not eaten since today for yesterday.  States that this feels similar to her last time she was here 4 months ago.  Per chart review patient was seen here about 4 months ago for similar impression, at that time had negative CT abdomen pelvis with contrast and negative CT angio chest aorta.  Patient was able to go home after nausea and vomiting were controlled, follow-up with your PCP later who diagnosed with intractable vomiting with nausea with suspicion of gastroenteritis.  A month later patient then followed up with cardiology, per chart review calcium score in 2021 is 0, echo in 2021 showed normal LV function, follows with Dr. Stanford Breed.  Patient states that abdominal pain is generalized, however more specific in her epigastric region.  Does not radiate anywhere.  No relief with anything.  Denies any back pain.  Denies any cardiac history.  HPI     Past Medical History:   Diagnosis Date  . Cancer (HCC)    OVARIAN  . Colonic polyp   . Hyperlipidemia   . Hypertension   . Sjogren's disease ([place])   . Thyroid disease    HYPOTHYROIDISM    Patient Active Problem List   Diagnosis Date Noted  . OSA (obstructive sleep apnea) 09/10/2020  . TIA (transient ischemic attack) 04/29/2020  . Essential hypertension 04/29/2020  . Hormone replacement therapy (HRT) 09/09/2016  . Hypothyroidism 07/16/2014  . Sjogren's syndrome ([place]) 04/11/2013  . History of ovarian cancer 04/11/2013  . Personal history of colonic polyps 04/11/2013  . ADD (attention deficit disorder) 07/06/2012  . Dysthymia 07/06/2012    Past Surgical History:  Procedure Laterality Date  . ABDOMINAL HYSTERECTOMY     Ovarian cancer  . BREAST CYST ASPIRATION    . CESAREAN SECTION     x2  . COLONOSCOPY  10-06   Dr. Earnest Bailey  . TONSILLECTOMY       OB History   No obstetric history on file.     Family History  Problem Relation Age of Onset  . Mental illness Mother   . Hypertension Mother   . Heart failure Mother   . Mental illness Sister   . Mental illness Brother   . Mental illness Maternal Uncle   . Mental illness Maternal Grandmother   . Cancer Paternal Grandmother   . CAD Father        CABG at age 33    Social History   Tobacco Use  . Smoking status: Never Smoker  .  Smokeless tobacco: Never Used  Substance Use Topics  . Alcohol use: Yes    Comment: 6 glasses per week  . Drug use: No    Home Medications Prior to Admission medications   Medication Sig Start Date End Date Taking? Authorizing Provider  aspirin EC 81 MG tablet Take 81 mg by mouth daily. Swallow whole.    [provider]  atorvastatin (LIPITOR) 40 MG tablet TAKE 1 TABLET BY MOUTH ONCE DAILY 08/19/20 08/19/21  Denita Lung, MD  buPROPion (WELLBUTRIN XL) 300 MG 24 hr tablet Take 1 tablet (300 mg total) by mouth daily. 04/21/18   Denita Lung, MD  buPROPion (WELLBUTRIN XL) 300 MG 24 hr tablet TAKE 1  TABLET BY MOUTH EVERY MORNING 10/21/20 10/21/21  Eino Farber, PA-C  buPROPion (WELLBUTRIN XL) 300 MG 24 hr tablet TAKE 1 TABLET BY MOUTH EVERY MORNING 06/27/20 06/27/21  Eino Farber, PA-C  diazepam (VALIUM) 5 MG tablet TAKE 1 TO 2 TABLETS BY MOUTH 30-45 MINUTES PRIOR TO PROCEDURE 11/21/20 05/20/21  Covington Thane Edu, PA-C  estradiol (ESTRACE) 0.5 MG tablet TAKE 1 TABLET BY MOUTH ONCE A DAY 06/03/20 06/03/21  Vania Rea, MD  guanFACINE (TENEX) 2 MG tablet Take 2 mg by mouth in the morning and at bedtime.     [provider]  guanFACINE (TENEX) 2 MG tablet TAKE 1 TABLET BY MOUTH EVERY MORNING AND 1 TABLET AT BEDTIME 10/21/20 10/21/21  Eino Farber, PA-C  guanFACINE (TENEX) 2 MG tablet TAKE 1 TABLET BY MOUTH IN THE MORNING AND 1 TABLET IN THE EVENING AT BEDTIME 06/27/20 06/27/21  Eino Farber, PA-C  guanFACINE (TENEX) 2 MG tablet TAKE 1 TABLET BY MOUTH EVERY MORNING AND 1 TABLET EVERY NIGHT AT BEDTIME 06/24/20 06/24/21  Eino Farber, PA-C  hydrochlorothiazide (HYDRODIURIL) 25 MG tablet TAKE 1 TABLET BY MOUTH ONCE A DAY 09/16/20 09/16/21  Lelon Perla, MD  hydrochlorothiazide (HYDRODIURIL) 25 MG tablet TAKE 1 TABLET BY MOUTH ONCE DAILY 09/16/20 09/16/21  Lelon Perla, MD  levothyroxine (SYNTHROID) 88 MCG tablet Take 88 mcg by mouth daily. 11/27/19   [provider]  levothyroxine (SYNTHROID) 88 MCG tablet TAKE 1 TABLET BY MOUTH ONCE DAILY 05/24/20 05/24/21  Vania Rea, MD  methylphenidate (RITALIN) 20 MG tablet Take 1 tablet (20 mg total) by mouth 3 (three) times daily. 09/29/18   Denita Lung, MD  methylphenidate (RITALIN) 20 MG tablet TAKE 1 TABLET BY MOUTH 3 TIMES DAILY (FILL 01/04/21) 11/20/20 05/19/21  Eino Farber, PA-C  methylphenidate (RITALIN) 20 MG tablet TAKE 1 TABLET BY MOUTH 3 TIMES DAILY 10/22/20 04/20/21  Eino Farber, PA-C  methylphenidate (RITALIN) 20 MG tablet TAKE 1 TABLET BY MOUTH 3 TIMES DAILY 09/30/20 03/29/21  Eino Farber, PA-C  methylphenidate (RITALIN) 20 MG tablet TAKE 1 TABLET  BY MOUTH THREE TIMES DAILY 06/27/20 12/24/20  Eino Farber, PA-C  mirabegron ER (MYRBETRIQ) 50 MG TB24 tablet TAKE 1 TABLET BY MOUTH ONCE DAILY 02/16/20 02/15/21  Robley Fries, MD  MYRBETRIQ 50 MG TB24 tablet Take 50 mg by mouth daily. 04/11/20   [provider]  pilocarpine (SALAGEN) 5 MG tablet TAKE 1 TABLET BY MOUTH 4 TIMES DAILY 12/09/20 12/09/21  Denita Lung, MD  promethazine (PHENERGAN) 25 MG suppository Place 1 suppository (25 mg total) rectally every 6 (six) hours as needed for nausea or vomiting. 10/08/20 11/05/20  Molpus, Jenny Reichmann, MD    Allergies    Patient has no known allergies.  Review of Systems   Review of  Systems  Constitutional: Negative for chills, diaphoresis, fatigue and fever.  HENT: Negative for congestion, sore throat and trouble swallowing.   Eyes: Negative for pain and visual disturbance.  Respiratory: Negative for cough, shortness of breath and wheezing.   Cardiovascular: Negative for chest pain, palpitations and leg swelling.  Gastrointestinal: Positive for abdominal pain, nausea and vomiting. Negative for abdominal distention and diarrhea.  Genitourinary: Negative for difficulty urinating.  Musculoskeletal: Negative for back pain, neck pain and neck stiffness.  Skin: Negative for pallor.  Neurological: Negative for dizziness, speech difficulty, weakness and headaches.  Psychiatric/Behavioral: Negative for confusion.    Physical Exam Updated Vital Signs BP 140/63   Pulse 88   Temp 98.6 F (37 C) (Oral)   Resp 18   SpO2 98%   Physical Exam Constitutional:      General: She is in acute distress.     Appearance: Normal appearance. She is not ill-appearing, toxic-appearing or diaphoretic.     Comments: Patient is retching and hyperventilating, however nontoxic appearing.  HENT:     Mouth/Throat:     Mouth: Mucous membranes are moist.     Pharynx: Oropharynx is clear.  Eyes:     General: No scleral icterus.    Extraocular Movements: Extraocular  movements intact.     Pupils: Pupils are equal, round, and reactive to light.  Cardiovascular:     Rate and Rhythm: Normal rate and regular rhythm.     Pulses: Normal pulses.     Heart sounds: Normal heart sounds.  Pulmonary:     Effort: Pulmonary effort is normal. No respiratory distress.     Breath sounds: Normal breath sounds. No stridor. No wheezing, rhonchi or rales.  Chest:     Chest wall: No tenderness.  Abdominal:     General: Abdomen is flat. There is no distension.     Palpations: Abdomen is soft.     Tenderness: There is no abdominal tenderness. There is no guarding or rebound.     Comments: No abdominal tenderness  Musculoskeletal:        General: No swelling or tenderness. Normal range of motion.     Cervical back: Normal range of motion and neck supple. No rigidity.     Right lower leg: No edema.     Left lower leg: No edema.  Skin:    General: Skin is warm and dry.     Capillary Refill: Capillary refill takes less than 2 seconds.     Coloration: Skin is not pale.  Neurological:     General: No focal deficit present.     Mental Status: She is alert and oriented to person, place, and time.  Psychiatric:        Mood and Affect: Mood normal.        Behavior: Behavior normal.     ED Results / Procedures / Treatments   Labs (all labs ordered are listed, but only abnormal results are displayed) Labs Reviewed  COMPREHENSIVE METABOLIC PANEL - Abnormal; Notable for the following components:      Result Value   Potassium 3.0 (*)    CO2 20 (*)    Glucose, Bld 146 (*)    Calcium 10.4 (*)    AST 50 (*)    ALT 82 (*)    Anion gap 18 (*)    All other components within normal limits  CBC - Abnormal; Notable for the following components:   WBC 11.2 (*)    All other components within normal  limits  URINALYSIS, ROUTINE W REFLEX MICROSCOPIC - Abnormal; Notable for the following components:   pH 9.0 (*)    Ketones, ur 80 (*)    All other components within normal limits   RESP PANEL BY RT-PCR (FLU A&B, COVID) ARPGX2  LIPASE, BLOOD  TROPONIN I (HIGH SENSITIVITY)    EKG EKG Interpretation  Date/Time:  Sunday Feb 02 2021 10:54:07 EDT Ventricular Rate:  69 PR Interval:  153 QRS Duration: 104 QT Interval:  473 QTC Calculation: 507 R Axis:   -25 Text Interpretation: Sinus rhythm Borderline left axis deviation Abnormal R-wave progression, early transition Borderline T wave abnormalities Borderline prolonged QT interval Since last tracing QT has lengthened and nonspecific T wave abnormality is present Confirmed by Daleen Bo [phone]) on 02/02/2021 11:09:32 AM   Radiology CT Abdomen Pelvis Wo Contrast  Result Date: 02/02/2021 CLINICAL DATA:  Epigastric pain, vomiting since yesterday EXAM: CT ABDOMEN AND PELVIS WITHOUT CONTRAST TECHNIQUE: Multidetector CT imaging of the abdomen and pelvis was performed following the standard protocol without IV contrast. COMPARISON:  10/08/2020 FINDINGS: Lower chest: No acute abnormality.  Small hiatal hernia. Hepatobiliary: No solid liver abnormality is seen. No gallstones, gallbladder wall thickening, or biliary dilatation. Pancreas: Unremarkable. No pancreatic ductal dilatation or surrounding inflammatory changes. Spleen: Normal in size without significant abnormality. Adrenals/Urinary Tract: Adrenal glands are unremarkable. Kidneys are normal, without renal calculi, solid lesion, or hydronephrosis. Bladder is unremarkable. Stomach/Bowel: Stomach is within normal limits. Appendix appears normal. No evidence of bowel wall thickening, distention, or inflammatory changes. Occasional descending and sigmoid diverticula. Vascular/Lymphatic: Scattered aortic atherosclerosis. No enlarged abdominal or pelvic lymph nodes. Reproductive: Status post hysterectomy. Other: No abdominal wall hernia or abnormality. No abdominopelvic ascites. Musculoskeletal: No acute or significant osseous findings. IMPRESSION: 1. No non-contrast CT findings of the  abdomen or pelvis to explain epigastric pain, nausea, or vomiting. No evidence of bowel obstruction. 2. Small hiatal hernia. 3. Occasional descending and sigmoid diverticula without evidence of acute diverticulitis. 4. Status post hysterectomy. Aortic Atherosclerosis (ICD10-I70.0). Electronically Signed   By: Eddie Candle M.D.   On: 02/02/2021 11:48    Procedures Procedures   Medications Ordered in ED Medications  promethazine (PHENERGAN) 12.5 mg in sodium chloride 0.9 % 50 mL IVPB (12.5 mg Intravenous New Bag/Given 02/02/21 1154)  sodium chloride 0.9 % bolus 1,000 mL (0 mLs Intravenous Stopped 02/02/21 1100)  ondansetron (ZOFRAN) injection 4 mg (4 mg Intravenous Given 02/02/21 0955)  fentaNYL (SUBLIMAZE) injection 50 mcg (50 mcg Intravenous Given 02/02/21 0957)  pantoprazole (PROTONIX) injection 40 mg (40 mg Intravenous Given 02/02/21 0955)  0.9 % NaCl with KCl 20 mEq/ L  infusion ( Intravenous New Bag/Given 02/02/21 1215)  alum & mag hydroxide-simeth (MAALOX/MYLANTA) 200-200-20 MG/5ML suspension 30 mL (30 mLs Oral Given 02/02/21 1216)    And  lidocaine (XYLOCAINE) 2 % viscous mouth solution 15 mL (15 mLs Oral Given 02/02/21 1216)  potassium chloride SA (KLOR-CON) CR tablet 40 mEq (40 mEq Oral Given 02/02/21 1431)  metoCLOPramide (REGLAN) injection 10 mg (10 mg Intravenous Given 02/02/21 1333)  diphenhydrAMINE (BENADRYL) injection 12.5 mg (12.5 mg Intravenous Given 02/02/21 1332)    ED Course  I have reviewed the triage vital signs and the nursing notes.  Pertinent labs & imaging results that were available during my care of the patient were reviewed by me and considered in my medical decision making (see chart for details).    MDM Rules/Calculators/A&P  Dorothy Wood is a 60 y.o. female with pertinent medical history of Sjogren's, hypothyroidism, hyperlipidemia, hypertension that presents emerge department today for abdominal pain nausea vomiting.  Patient is  nontoxic-appearing, however does appear as if she is in acute distress due to the amount of retching that is occurring.  Differential to include gastroparesis, gastroenteritis, pancreatitis, SBO.  Low suspicion for ACS, however will obtain 1 troponin and EKG.  Patient denies chest pain besides burning sensation in her chest, more consistent with acid reflux or esophagitis.  Initial inventions include fluids, fentanyl, Zofran and Protonix.  Upon evaluation an hour later, patient is still vomiting, will give Phenergan at this time.  CT abdomen pelvis without any acute intra-abdominal pathology.  Work-up today shows CMP of potassium of 3, is currently being repleted.  Anion gap of 18, this is most likely from dehydration.  WBC 11.2,'s is most likely from vomiting.  Patient appears dehydrated with ketones in urine.  Troponin 3 with EKG slighlty prolonged qtc 507, do not think that there is a cardiac component with nausea vomiting and epigastric pain.   Upon reevaluation, patient still vomiting with Phenergan on board, will give metoclopramide and Benadryl at this time.  If patient does not pass p.o. challenge for the third time patient to be admitted.  Do not feel comfortable with giving more antiemetic at this time with qtc of 507, pt will need to be admitted. Most likely needs endoscopy.  Patient did not pass p.o. challenge, vomiting bilious fluid.  Patient admitted to Dr. Neysa Bonito.  The patient appears reasonably stabilized for admission considering the current resources, flow, and capabilities available in the ED at this time, and I doubt any other [facility] requiring further screening and/or treatment in the ED prior to admission.  Final Clinical Impression(s) / ED Diagnoses Final diagnoses:  Intractable nausea and vomiting    Rx / DC Orders ED Discharge Orders    None       Alfredia Client, PA-C 02/02/21 1526    Daleen Bo, MD 02/02/21 [phone]

## 2021-02-02 NOTE — ED Triage Notes (Signed)
Patient here from home reporting abd pain, n/v that started last night. Reports epigastric burning radiating down into abd with no relief. States hx of same 3 months ago.

## 2021-02-02 NOTE — H&P (Signed)
History and Physical        Hospital Admission Note Date: 02/02/2021  Patient name: Dorothy Wood Medical record number: [id number] Date of birth: 01-04-1961 Age: 60 y.o. Gender: female  PCP: Denita Lung, MD   Chief Complaint    Chief Complaint  Patient presents with  . Abdominal Pain      HPI:   This is a 60 year old female with a history of Sjogren's, hypothyroidism, hyperlipidemia, GERD, colonic stricture, alcohol use, hypertension who presented to the ED with abdominal pain, nausea, nonbloody vomiting since yesterday. Now unable to tolerate PO intake since Friday.  Has been able to have bowel movements.  Initially complained of epigastric pain and burning sensation in her chest despite being compliant with her Protonix 20 mg daily, this has since improved.  Patient was hospitalized about 4 months ago with similar symptoms at which time there was concern for gastroenteritis and was discharged with PCP follow-up. Drinks wine 3-4 times weekly, most recently on Friday and denies any withdrawal symptoms. Admits to chills recently but no fever or any other complaints.    ED Course: Afebrile, hemodynamically stable, on room air. Notable Labs: Na 136, K 3.0, CO220, Glucose 146, BUN 19, Cr 0.94, Ca 10.4, AG 18, Mg 1.6, Lipase 24, AST 50, ALT 82, WBC 11.2, COVID negative. Notable Imaging: CT abd/pelvis with a small hiatal hernia, diverticulosis and otherwise unremarkable. Patient received GI cocktail, benadryl, fentanyl, reglan, zofran, protonix, KCl PO, 1 L NS bolus and NaCl with KCl maintenance.    Vitals:   02/02/21 1600 02/02/21 1703  BP: 129/61 (!) 145/77  Pulse: 91 80  Resp: 15 18  Temp:  98.4 F (36.9 C)  SpO2: 100% 100%     Review of Systems:  Review of Systems  All other systems reviewed and are negative.   Medical/Social/Family History   Past Medical  History: Past Medical History:  Diagnosis Date  . Cancer (HCC)    OVARIAN  . Colonic polyp   . Hyperlipidemia   . Hypertension   . Sjogren's disease ([place])   . Thyroid disease    HYPOTHYROIDISM    Past Surgical History:  Procedure Laterality Date  . ABDOMINAL HYSTERECTOMY     Ovarian cancer  . BREAST CYST ASPIRATION    . CESAREAN SECTION     x2  . COLONOSCOPY  10-06   Dr. Earnest Bailey  . TONSILLECTOMY      Medications: Prior to Admission medications   Medication Sig Start Date End Date Taking? Authorizing Provider  aspirin EC 81 MG tablet Take 81 mg by mouth daily. Swallow whole.    [provider]  atorvastatin (LIPITOR) 40 MG tablet TAKE 1 TABLET BY MOUTH ONCE DAILY 08/19/20 08/19/21  Denita Lung, MD  buPROPion (WELLBUTRIN XL) 300 MG 24 hr tablet Take 1 tablet (300 mg total) by mouth daily. 04/21/18   Denita Lung, MD  buPROPion (WELLBUTRIN XL) 300 MG 24 hr tablet TAKE 1 TABLET BY MOUTH EVERY MORNING 10/21/20 10/21/21  Eino Farber, PA-C  buPROPion (WELLBUTRIN XL) 300 MG 24 hr tablet TAKE 1 TABLET BY MOUTH EVERY MORNING 06/27/20 06/27/21  Eino Farber, PA-C  diazepam (VALIUM) 5 MG tablet TAKE  1 TO 2 TABLETS BY MOUTH 30-45 MINUTES PRIOR TO PROCEDURE 11/21/20 05/20/21  Covington Thane Edu, PA-C  estradiol (ESTRACE) 0.5 MG tablet TAKE 1 TABLET BY MOUTH ONCE A DAY 06/03/20 06/03/21  Vania Rea, MD  guanFACINE (TENEX) 2 MG tablet Take 2 mg by mouth in the morning and at bedtime.     [provider]  guanFACINE (TENEX) 2 MG tablet TAKE 1 TABLET BY MOUTH EVERY MORNING AND 1 TABLET AT BEDTIME 10/21/20 10/21/21  Eino Farber, PA-C  guanFACINE (TENEX) 2 MG tablet TAKE 1 TABLET BY MOUTH IN THE MORNING AND 1 TABLET IN THE EVENING AT BEDTIME 06/27/20 06/27/21  Eino Farber, PA-C  guanFACINE (TENEX) 2 MG tablet TAKE 1 TABLET BY MOUTH EVERY MORNING AND 1 TABLET EVERY NIGHT AT BEDTIME 06/24/20 06/24/21  Eino Farber, PA-C  hydrochlorothiazide (HYDRODIURIL) 25 MG tablet TAKE 1 TABLET BY  MOUTH ONCE A DAY 09/16/20 09/16/21  Lelon Perla, MD  hydrochlorothiazide (HYDRODIURIL) 25 MG tablet TAKE 1 TABLET BY MOUTH ONCE DAILY 09/16/20 09/16/21  Lelon Perla, MD  levothyroxine (SYNTHROID) 88 MCG tablet Take 88 mcg by mouth daily. 11/27/19   [provider]  levothyroxine (SYNTHROID) 88 MCG tablet TAKE 1 TABLET BY MOUTH ONCE DAILY 05/24/20 05/24/21  Vania Rea, MD  methylphenidate (RITALIN) 20 MG tablet Take 1 tablet (20 mg total) by mouth 3 (three) times daily. 09/29/18   Denita Lung, MD  methylphenidate (RITALIN) 20 MG tablet TAKE 1 TABLET BY MOUTH 3 TIMES DAILY (FILL 01/04/21) 11/20/20 05/19/21  Eino Farber, PA-C  methylphenidate (RITALIN) 20 MG tablet TAKE 1 TABLET BY MOUTH 3 TIMES DAILY 10/22/20 04/20/21  Eino Farber, PA-C  methylphenidate (RITALIN) 20 MG tablet TAKE 1 TABLET BY MOUTH 3 TIMES DAILY 09/30/20 03/29/21  Eino Farber, PA-C  methylphenidate (RITALIN) 20 MG tablet TAKE 1 TABLET BY MOUTH THREE TIMES DAILY 06/27/20 12/24/20  Eino Farber, PA-C  mirabegron ER (MYRBETRIQ) 50 MG TB24 tablet TAKE 1 TABLET BY MOUTH ONCE DAILY 02/16/20 02/15/21  Robley Fries, MD  MYRBETRIQ 50 MG TB24 tablet Take 50 mg by mouth daily. 04/11/20   [provider]  pilocarpine (SALAGEN) 5 MG tablet TAKE 1 TABLET BY MOUTH 4 TIMES DAILY 12/09/20 12/09/21  Denita Lung, MD  promethazine (PHENERGAN) 25 MG suppository Place 1 suppository (25 mg total) rectally every 6 (six) hours as needed for nausea or vomiting. 10/08/20 11/05/20  Molpus, Jenny Reichmann, MD    Allergies:  No Known Allergies  Social History:  reports that she has never smoked. She has never used smokeless tobacco. She reports current alcohol use. She reports that she does not use drugs.  Family History: Family History  Problem Relation Age of Onset  . Mental illness Mother   . Hypertension Mother   . Heart failure Mother   . Mental illness Sister   . Mental illness Brother   . Mental illness Maternal Uncle   . Mental illness Maternal  Grandmother   . Cancer Paternal Grandmother   . CAD Father        CABG at age 53     Objective   Physical Exam: Blood pressure (!) 145/77, pulse 80, temperature 98.4 F (36.9 C), temperature source Oral, resp. rate 18, SpO2 100 %.  Physical Exam Vitals and nursing note reviewed.  Constitutional:      Appearance: Normal appearance.  HENT:     Head: Normocephalic and atraumatic.  Eyes:     Conjunctiva/sclera: Conjunctivae normal.  Cardiovascular:  Rate and Rhythm: Normal rate and regular rhythm.  Pulmonary:     Effort: Pulmonary effort is normal.     Breath sounds: Normal breath sounds.  Abdominal:     General: Abdomen is flat.     Palpations: Abdomen is soft.  Musculoskeletal:        General: No swelling or tenderness.  Skin:    Coloration: Skin is not jaundiced or pale.  Neurological:     Mental Status: She is alert. Mental status is at baseline.  Psychiatric:        Mood and Affect: Mood normal.        Behavior: Behavior normal.     LABS on Admission: I have personally reviewed all the labs and imaging below    Basic Metabolic Panel: Recent Labs  Lab 02/02/21 0829  NA 136  K 3.0*  CL 98  CO2 20*  GLUCOSE 146*  BUN 19  CREATININE 0.94  CALCIUM 10.4*  MG 1.6*   Liver Function Tests: Recent Labs  Lab 02/02/21 0829  AST 50*  ALT 82*  ALKPHOS 106  BILITOT 1.1  PROT 8.1  ALBUMIN 5.0   Recent Labs  Lab 02/02/21 0829  LIPASE 24   No results for input(s): AMMONIA in the last 168 hours. CBC: Recent Labs  Lab 02/02/21 0829  WBC 11.2*  HGB 14.1  HCT 41.0  MCV 87.6  PLT 324   Cardiac Enzymes: No results for input(s): CKTOTAL, CKMB, CKMBINDEX, TROPONINI in the last 168 hours. BNP: Invalid input(s): POCBNP CBG: No results for input(s): GLUCAP in the last 168 hours.  Radiological Exams on Admission:  CT Abdomen Pelvis Wo Contrast  Result Date: 02/02/2021 CLINICAL DATA:  Epigastric pain, vomiting since yesterday EXAM: CT ABDOMEN AND  PELVIS WITHOUT CONTRAST TECHNIQUE: Multidetector CT imaging of the abdomen and pelvis was performed following the standard protocol without IV contrast. COMPARISON:  10/08/2020 FINDINGS: Lower chest: No acute abnormality.  Small hiatal hernia. Hepatobiliary: No solid liver abnormality is seen. No gallstones, gallbladder wall thickening, or biliary dilatation. Pancreas: Unremarkable. No pancreatic ductal dilatation or surrounding inflammatory changes. Spleen: Normal in size without significant abnormality. Adrenals/Urinary Tract: Adrenal glands are unremarkable. Kidneys are normal, without renal calculi, solid lesion, or hydronephrosis. Bladder is unremarkable. Stomach/Bowel: Stomach is within normal limits. Appendix appears normal. No evidence of bowel wall thickening, distention, or inflammatory changes. Occasional descending and sigmoid diverticula. Vascular/Lymphatic: Scattered aortic atherosclerosis. No enlarged abdominal or pelvic lymph nodes. Reproductive: Status post hysterectomy. Other: No abdominal wall hernia or abnormality. No abdominopelvic ascites. Musculoskeletal: No acute or significant osseous findings. IMPRESSION: 1. No non-contrast CT findings of the abdomen or pelvis to explain epigastric pain, nausea, or vomiting. No evidence of bowel obstruction. 2. Small hiatal hernia. 3. Occasional descending and sigmoid diverticula without evidence of acute diverticulitis. 4. Status post hysterectomy. Aortic Atherosclerosis (ICD10-I70.0). Electronically Signed   By: Eddie Candle M.D.   On: 02/02/2021 11:48      EKG: normal sinus rhythm, nonspecific ST and T waves changes, prolonged QT interval (507 ms)   A & P   Principal Problem:   Intractable nausea and vomiting Active Problems:   TIA (transient ischemic attack)   Essential hypertension   Prolonged QT interval   High anion gap metabolic acidosis   1. Recurrent Intractable Nausea and vomiting of unclear etiology a. No history of  gastroparesis b. Has a history of Sjogrens and colonic stricture c. Continue IV fluids d. Protonix IV daily e. HbA1c to screen for  diabetes f. NPO for now g. Unfortunately need to hold off on antiemetics for now until electrolytes are corrected and prolonged QT is improved h. [facility] consult to evaluate for possible endoscopy  2. Prolonged QTc a. Likely from electrolyte abnormalities b. Goal K> 4.0, Goal Mg > 2.0 -> replace as needed c. Hold QT prolonging agents d. telemetry  3. Anion gap metabolic acidosis a. Could be from starvation ketosis (urine has 80 ketones) b. Continue IV fluids, PO intake when able  4. Alcohol use a. Drinks wine 3-4 nights weekly, most recently Friday b. CIWA protocol  5. Hypokalemia a. replete  6. Hypomagnesemia a. replete  7. Elevated LFTs a. trend  8. History of Hypertension  Hyperlipidemia  TIA  a. Holding PO meds    DVT prophylaxis: lovenox   Code Status: Full Code  Diet: npo Family Communication: Admission, patients condition and plan of care including tests being ordered have been discussed with the patient who indicates understanding and agrees with the plan and Code Status. Patient's daughter was updated  Disposition Plan: The appropriate patient status for this patient is OBSERVATION. Observation status is judged to be reasonable and necessary in order to provide the required intensity of service to ensure the patient's safety. The patient's presenting symptoms, physical exam findings, and initial radiographic and laboratory data in the context of their medical condition is felt to place them at decreased risk for further clinical deterioration. Furthermore, it is anticipated that the patient will be medically stable for discharge from the hospital within 2 midnights of admission. The following factors support the patient status of observation.   " The patient's presenting symptoms include intractable nausea/vomiting. " The physical  exam findings include unremarkable. " The initial radiographic and laboratory data are as above.   Consultants  . GI  Procedures  . none  Time Spent on Admission: 62 minutes    Harold Hedge, DO [facility]  02/02/2021, 5:43 PM

## 2021-02-03 ENCOUNTER — Observation Stay (HOSPITAL_COMMUNITY): Payer: 59

## 2021-02-03 DIAGNOSIS — R112 Nausea with vomiting, unspecified: Secondary | ICD-10-CM | POA: Diagnosis not present

## 2021-02-03 DIAGNOSIS — I10 Essential (primary) hypertension: Secondary | ICD-10-CM | POA: Diagnosis not present

## 2021-02-03 DIAGNOSIS — R140 Abdominal distension (gaseous): Secondary | ICD-10-CM | POA: Diagnosis not present

## 2021-02-03 DIAGNOSIS — R9431 Abnormal electrocardiogram [ECG] [EKG]: Secondary | ICD-10-CM | POA: Diagnosis not present

## 2021-02-03 DIAGNOSIS — E872 Acidosis: Secondary | ICD-10-CM | POA: Diagnosis not present

## 2021-02-03 DIAGNOSIS — E039 Hypothyroidism, unspecified: Secondary | ICD-10-CM | POA: Diagnosis not present

## 2021-02-03 LAB — BASIC METABOLIC PANEL
Anion gap: 7 (ref 5–15)
BUN: 17 mg/dL (ref 6–20)
CO2: 22 mmol/L (ref 22–32)
Calcium: 8.8 mg/dL — ABNORMAL LOW (ref 8.9–10.3)
Chloride: 106 mmol/L (ref 98–111)
Creatinine, Ser: 0.68 mg/dL (ref 0.44–1.00)
GFR, Estimated: >60 mL/min (ref >60)
Glucose, Bld: 113 mg/dL — ABNORMAL HIGH (ref 70–99)
Potassium: 3.2 mmol/L — ABNORMAL LOW (ref 3.5–5.1)
Sodium: 135 mmol/L (ref 135–145)

## 2021-02-03 LAB — HEMOGLOBIN A1C
Hgb A1c MFr Bld: 6 % — ABNORMAL HIGH (ref 4.8–5.6)
Mean Plasma Glucose: 125.5 mg/dL

## 2021-02-03 LAB — MAGNESIUM: Magnesium: 2.6 mg/dL — ABNORMAL HIGH (ref 1.7–2.4)

## 2021-02-03 IMAGING — DX DG ABDOMEN 2V
2 series · 2 of 2 positions shown · non-contrast
Comparison: [DATE]. CT of the abdomen and pelvis dated

CLINICAL DATA: History of TEL, hypothyroidism and
hypertension. Nausea and vomiting.

EXAM:
ABDOMEN - 2 VIEW

[abdomen erect]
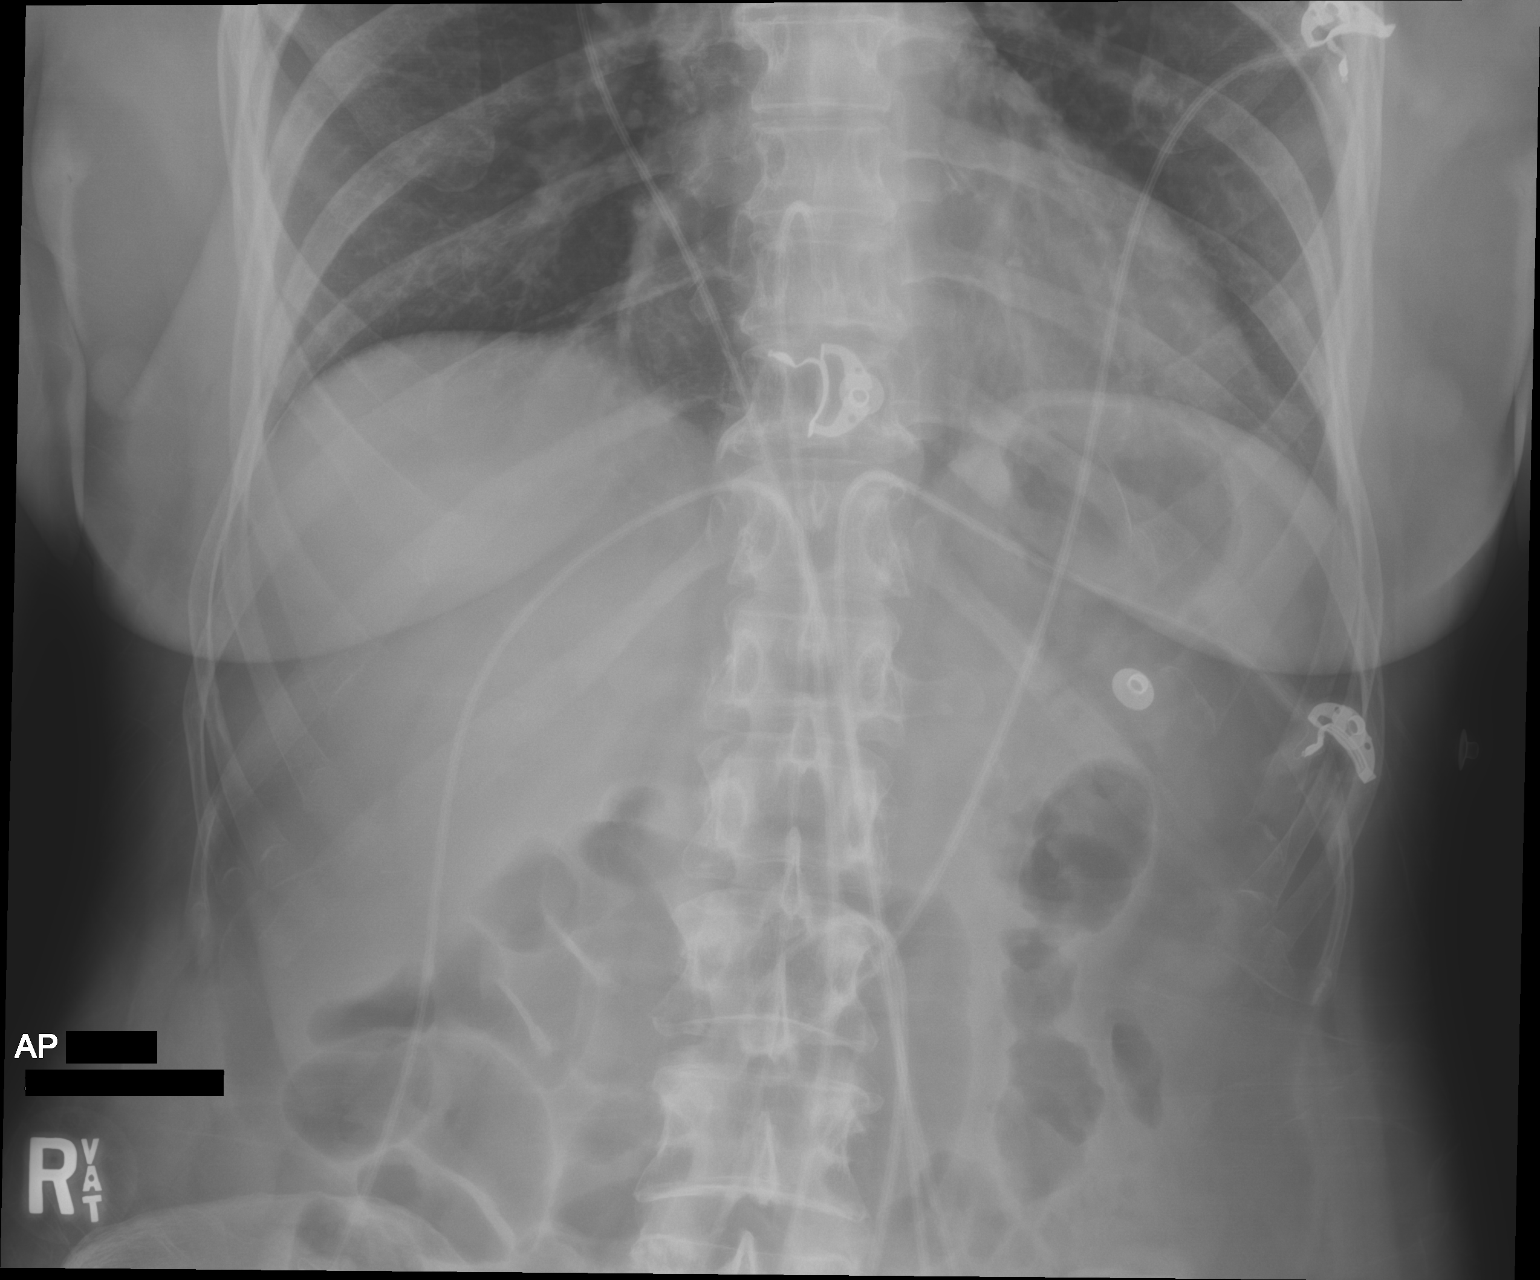

[abdomen supine]
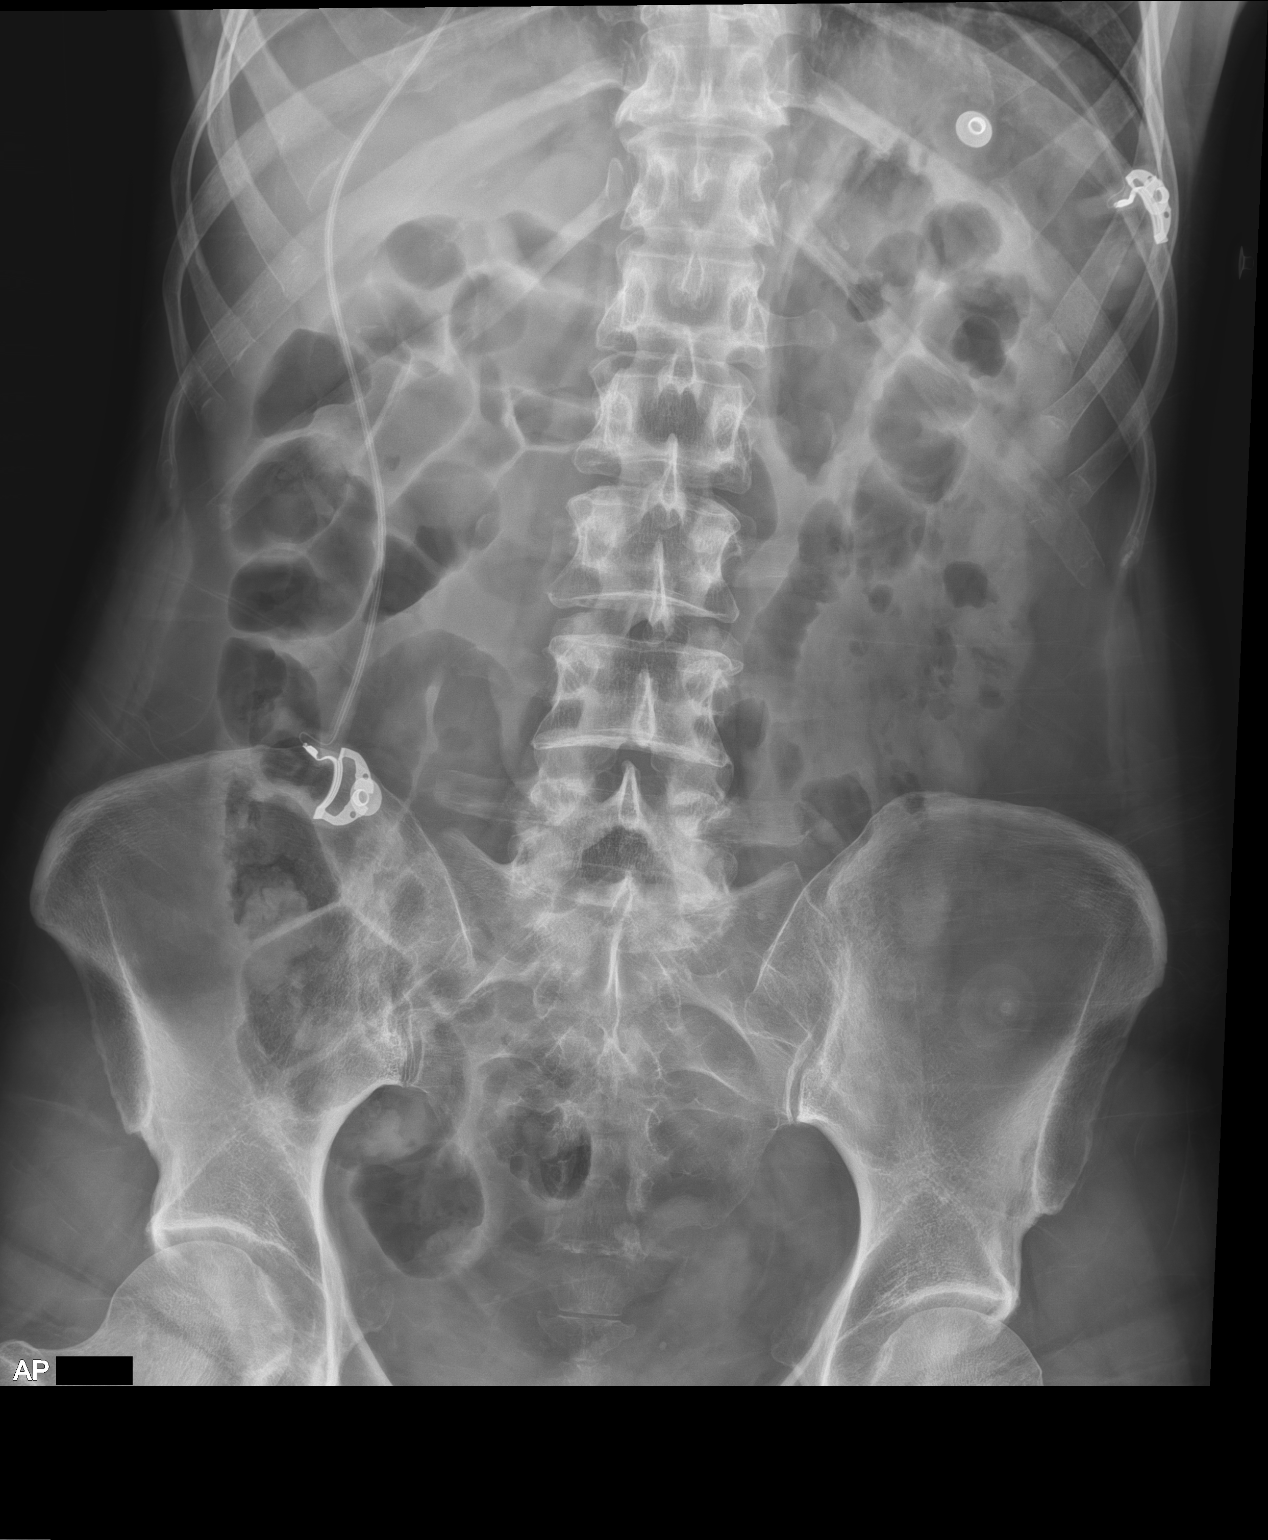

[2 of 2 positions shown; findings below may reference images not displayed]

FINDINGS: Minimal atelectasis at the LEFT lung base.

EKG leads project over the lower chest and upper abdomen.

Upright radiograph without signs of free air beneath either the
RIGHT or LEFT hemidiaphragm.

Bowel gas pattern with mild gaseous distension of small bowel up to
2.7 cm.

No abnormal distension of the colon.  No definite rectal gas.

No acute skeletal process on limited assessment. No abnormal
calcifications.
IMPRESSION: 1. Mild gaseous distension of small bowel up to 2.7 cm. No abnormal
distension of the colon. Findings favor developing ileus w.
attention on follow-up given lack of definite rectal gas.
2. Minimal atelectasis at the LEFT lung base.

## 2021-02-03 MED ORDER — HYDROCHLOROTHIAZIDE 25 MG PO TABS: 25.0000 mg | ORAL_TABLET | Freq: Every morning | ORAL | Status: DC

## 2021-02-03 MED ORDER — PILOCARPINE HCL 5 MG PO TABS
5.0000 mg | ORAL_TABLET | ORAL | Status: DC
  Administered 2021-02-03 – 2021-02-05 (×6): 5 mg via ORAL
  Filled 2021-02-03 (×9): qty 1

## 2021-02-03 MED ORDER — LEVOTHYROXINE SODIUM 88 MCG PO TABS
88.0000 ug | ORAL_TABLET | Freq: Every day | ORAL | Status: DC
  Administered 2021-02-05: 88 ug via ORAL
  Filled 2021-02-03: qty 1

## 2021-02-03 MED ORDER — ATORVASTATIN CALCIUM 40 MG PO TABS
40.0000 mg | ORAL_TABLET | Freq: Every day | ORAL | Status: DC
  Administered 2021-02-03 – 2021-02-04 (×2): 40 mg via ORAL
  Filled 2021-02-03 (×2): qty 1

## 2021-02-03 MED ORDER — LORAZEPAM 1 MG PO TABS
1.0000 mg | ORAL_TABLET | ORAL | Status: DC | PRN
  Administered 2021-02-03 – 2021-02-04 (×2): 1 mg via ORAL
  Filled 2021-02-03 (×2): qty 1

## 2021-02-03 MED ORDER — BUPROPION HCL ER (XL) 300 MG PO TB24
300.0000 mg | ORAL_TABLET | Freq: Every morning | ORAL | Status: DC
  Administered 2021-02-03 – 2021-02-05 (×2): 300 mg via ORAL
  Filled 2021-02-03 (×2): qty 1

## 2021-02-03 MED ORDER — PANTOPRAZOLE SODIUM 40 MG IV SOLR
40.0000 mg | Freq: Two times a day (BID) | INTRAVENOUS | Status: DC
  Administered 2021-02-03 – 2021-02-05 (×4): 40 mg via INTRAVENOUS
  Filled 2021-02-03 (×4): qty 40

## 2021-02-03 MED ORDER — GUANFACINE HCL 1 MG PO TABS
2.0000 mg | ORAL_TABLET | Freq: Two times a day (BID) | ORAL | Status: DC
  Administered 2021-02-03 – 2021-02-05 (×5): 2 mg via ORAL
  Filled 2021-02-03 (×5): qty 2

## 2021-02-03 MED ORDER — MIRABEGRON ER 25 MG PO TB24
50.0000 mg | ORAL_TABLET | Freq: Every day | ORAL | Status: DC
  Administered 2021-02-03 – 2021-02-04 (×2): 50 mg via ORAL
  Filled 2021-02-03 (×2): qty 2

## 2021-02-03 MED ORDER — POTASSIUM CHLORIDE 10 MEQ/100ML IV SOLN
10.0000 meq | INTRAVENOUS | Status: AC
  Administered 2021-02-03 (×4): 10 meq via INTRAVENOUS
  Filled 2021-02-03 (×4): qty 100

## 2021-02-03 MED ORDER — SODIUM CHLORIDE 0.9 % IV SOLN
150.0000 mg | Freq: Once | INTRAVENOUS | Status: AC
  Administered 2021-02-03: 150 mg via INTRAVENOUS
  Filled 2021-02-03: qty 5

## 2021-02-03 MED ORDER — METHYLPHENIDATE HCL 10 MG PO TABS
20.0000 mg | ORAL_TABLET | ORAL | Status: DC
  Filled 2021-02-03: qty 2

## 2021-02-03 MED ORDER — DEXTROSE-NACL 5-0.9 % IV SOLN
INTRAVENOUS | Status: DC
  Administered 2021-02-03: 1000 mL via INTRAVENOUS

## 2021-02-03 NOTE — Progress Notes (Addendum)
PROGRESS NOTE    Dorothy Wood  DGL:[id number] DOB: [birth year] DOA: 02/02/2021 PCP: Denita Lung, MD    Brief Narrative:  Mrs. Nass was admitted to the hospital with the working diagnosis of intractable nausea and vomiting.   60 year old female past medical history for Sjogren's syndrome, hypothyroidism, dyslipidemia, GERD, alcohol abuse, hypertension and colonic stricture who presented with abdominal pain, nausea and vomiting. Intractable symptoms, unable to take p.o. for about 48 hours.  Symptoms associated with burning epigastric abdominal pain, she drinks wine 3-4 times weekly.  On her initial physical examination the blood pressure was 129/61, heart rate 91, respiratory rate 15, temperature 98.4, oxygen saturation 100%, her lungs are clear to auscultation bilaterally, heart S1-S2, present, rhythmic, soft abdomen, no lower extremity edema.  Sodium 136, potassium 3.0, chloride 90, bicarb 20, glucose 146, BUN 19, creatinine 0.94, magnesium 1.6, white count 11.2, hemoglobin 14.1, hematocrit 41.0, platelets 324. SARS COVID-19 negative.  Urinalysis specific gravity 1.017   CT of the abdomen with small hiatal hernia, no acute findings.  EKG 69 bpm, left axis deviation, left anterior fascicular block, QTC 507, sinus rhythm, no significant ST segment changes, negative T wave lead III-aVF.   Assessment & Plan:   Principal Problem:   Intractable nausea and vomiting Active Problems:   TIA (transient ischemic attack)   Essential hypertension   Prolonged QT interval   High anion gap metabolic acidosis   1. Intractable nausea and vomiting, suspected gastritis. Patient has been NPO. Continue to have abdominal pain, this am with no vomiting.  No hematemesis.   Continue supportive medical therapy with IV fluids with dextrose, antiacid therapy with pantoprazole and as needed antiemetics and analgesics. Caution with antiemetics due to prolonged QTc  2. Hypokalemia, hypomagnesemia and  anion gap metabolic acidosis. Patient has been NPO, will continue hydration with isotonic saline and dextrose.  Follow up Mg is 2,6.   Check renal panel today, target K of 4.   Prolonged Qtc, 507, will check EKG after electrolyte correction. Caution with qtc prolonging medications.   3. History of alcohol use. No current clinical signs of alcohol withdrawal. Continue neuro checks per unit protocol. Will hold on CIWA protocol for now, and will add as needed oral lorazepam, contin close monitoring.   4. Hypertension, dyslipidemia, TIA. Blood pressure controlled, hold on HCTZ for now.   On atorvastatin for dyslipidemia.   5. Attention deficit disorder. Continue with bupropion and methylphenidate.  BID guanfacin.   6. Sjogren, Continue with pylocarpine.   7. Hypothyroid. Continue with levothyroxine.   Patient continue to be at high risk for worsening gastritis.   Status is: Observation   Dispo: The patient is from: Home              Anticipated d/c is to: Home              Patient currently is not medically stable to d/c.   Difficult to place patient No  DVT prophylaxis: scd  Enoxaparin  Code Status:   full  Family Communication:  No family at the bedside      Consultants:   GI     Subjective: Patient continue to have abdominal pain, associated with chills, she has been NPO, not back to her baseline. No chest pain or dyspnea.   Objective: Vitals:   02/03/21 0102 02/03/21 0520 02/03/21 1130 02/03/21 1135  BP: 117/68 (!) 121/55  135/69  Pulse: 86 77 82 76  Resp: 14 14  16   Temp: 99.1  F (37.3 C) 99.2 F (37.3 C) 98.3 F (36.8 C)   TempSrc: Oral Oral Oral   SpO2: 100% 100% 100%   Weight:      Height:        Intake/Output Summary (Last 24 hours) at 02/03/2021 1240 Last data filed at 02/03/2021 1040 Gross per 24 hour  Intake 0 ml  Output 900 ml  Net -900 ml   Filed Weights   02/02/21 1833  Weight: 58.4 kg    Examination:   General: Not in pain or  dyspnea, deconditioned and ill looking appearing  Neurology: Awake and alert, non focal, no tremors.  E ENT: mild pallor, no icterus, oral mucosa moist Cardiovascular: No JVD. S1-S2 present, rhythmic, no gallops, rubs, or murmurs. No lower extremity edema. Pulmonary: positive breath sounds bilaterally, adequate air movement, no wheezing, rhonchi or rales. Gastrointestinal. Abdomen soft, tender to deep palpation, no rebound or gurading Skin. No rashes Musculoskeletal: no joint deformities     Data Reviewed: I have personally reviewed following labs and imaging studies  CBC: Recent Labs  Lab 02/02/21 0829  WBC 11.2*  HGB 14.1  HCT 41.0  MCV 87.6  PLT 671   Basic Metabolic Panel: Recent Labs  Lab 02/02/21 0829 02/02/21 1932 02/03/21 0635  NA 136 135  --   K 3.0* 3.7  --   CL 98 105  --   CO2 20* 19*  --   GLUCOSE 146* 110*  --   BUN 19 14  --   CREATININE 0.94 0.88  --   CALCIUM 10.4* 8.9  --   MG 1.6*  --  2.6*  PHOS  --  2.6  --    GFR: Estimated Creatinine Clearance: 61.2 mL/min (by C-G formula based on SCr of 0.88 mg/dL). Liver Function Tests: Recent Labs  Lab 02/02/21 0829  AST 50*  ALT 82*  ALKPHOS 106  BILITOT 1.1  PROT 8.1  ALBUMIN 5.0   Recent Labs  Lab 02/02/21 0829  LIPASE 24   No results for input(s): AMMONIA in the last 168 hours. Coagulation Profile: No results for input(s): INR, PROTIME in the last 168 hours. Cardiac Enzymes: No results for input(s): CKTOTAL, CKMB, CKMBINDEX, TROPONINI in the last 168 hours. BNP (last 3 results) No results for input(s): PROBNP in the last 8760 hours. HbA1C: Recent Labs    02/02/21 1932  HGBA1C 6.0*   CBG: No results for input(s): GLUCAP in the last 168 hours. Lipid Profile: No results for input(s): CHOL, HDL, LDLCALC, TRIG, CHOLHDL, LDLDIRECT in the last 72 hours. Thyroid Function Tests: No results for input(s): TSH, T4TOTAL, FREET4, T3FREE, THYROIDAB in the last 72 hours. Anemia Panel: No  results for input(s): VITAMINB12, FOLATE, FERRITIN, TIBC, IRON, RETICCTPCT in the last 72 hours.    Radiology Studies: I have reviewed all of the imaging during this hospital visit personally     Scheduled Meds: . enoxaparin (LOVENOX) injection  40 mg Subcutaneous Q24H  . pantoprazole (PROTONIX) IV  40 mg Intravenous Q24H  . thiamine  100 mg Oral Daily   Or  . thiamine  100 mg Intravenous Daily   Continuous Infusions:   LOS: 0 days        Kamylah Manzo Gerome Apley, MD

## 2021-02-03 NOTE — Consult Note (Addendum)
Referring Provider:[facility] Primary Care Physician:  Denita Lung, MD Primary Gastroenterologist:  Dr. Cristina Gong  Reason for Consultation:  Nausea, vomiting  HPI: Dorothy Wood is a 60 y.o. female Sjogren's, hypothyroidism, HTN, GERD,  and sigmoid fixation (likely related to prior hysterectomy) presenting for consultation of nausea and vomiting.  Patient was in her usual state of health until Saturday morning 5/21, at which point she started having nausea and vomiting.  She states she was unable to tolerate PO intake.  She continued to have bilious emesis Saturday, as well as Sunday and thus presented to the ED.  She reports a burning sensation in her chest but denies any abdominal pain.  Denies any dysphagia.  States she had a similar episode approximately 4 months ago which was self-limited.  Also reports history of constipation but states she has not been able to pass flatus today.  Denies any diarrhea, melena or hematochezia. Reports intermittent chills but denies fever. Denies NSAID, aspirin, or blood thinner use.  Last colonoscopy 06/2017 revealed moderate sigmoid fixation (likely related to prior hysterectomy), one 34mm tubular adenoma, and sigmoid diverticulosis.  Past Medical History:  Diagnosis Date  . Cancer (HCC)    OVARIAN  . Colonic polyp   . Hyperlipidemia   . Hypertension   . Sjogren's disease ([place])   . Thyroid disease    HYPOTHYROIDISM    Past Surgical History:  Procedure Laterality Date  . ABDOMINAL HYSTERECTOMY     Ovarian cancer  . BREAST CYST ASPIRATION    . CESAREAN SECTION     x2  . COLONOSCOPY  10-06   Dr. Earnest Bailey  . TONSILLECTOMY      Prior to Admission medications   Medication Sig Start Date End Date Taking? Authorizing Provider  aspirin EC 81 MG tablet Take 81 mg by mouth every morning. Swallow whole.   Yes [provider]  atorvastatin (LIPITOR) 40 MG tablet TAKE 1 TABLET BY MOUTH ONCE DAILY Patient taking differently: Take 40 mg by mouth at  bedtime. 08/19/20 08/19/21 Yes Denita Lung, MD  buPROPion (WELLBUTRIN XL) 300 MG 24 hr tablet TAKE 1 TABLET BY MOUTH EVERY MORNING Patient taking differently: Take 300 mg by mouth every morning. 10/21/20 10/21/21 Yes Eino Farber, PA-C  guanFACINE (TENEX) 2 MG tablet TAKE 1 TABLET BY MOUTH EVERY MORNING AND 1 TABLET AT BEDTIME Patient taking differently: Take 2 mg by mouth in the morning and at bedtime. 10/21/20 10/21/21 Yes Eino Farber, PA-C  hydrochlorothiazide (HYDRODIURIL) 25 MG tablet TAKE 1 TABLET BY MOUTH ONCE DAILY Patient taking differently: Take 25 mg by mouth every morning. 09/16/20 09/16/21 Yes Lelon Perla, MD  levothyroxine (SYNTHROID) 88 MCG tablet TAKE 1 TABLET BY MOUTH ONCE DAILY Patient taking differently: Take 88 mcg by mouth every morning. 05/24/20 05/24/21 Yes Vania Rea, MD  LORazepam (ATIVAN) 1 MG tablet Take 1 mg by mouth at bedtime as needed for anxiety or sleep.   Yes [provider]  methylphenidate (RITALIN) 20 MG tablet TAKE 1 TABLET BY MOUTH 3 TIMES DAILY (FILL 01/04/21) Patient taking differently: Take 20 mg by mouth See admin instructions. Take one tablet (20 mg) by mouth 3 times daily - morning, lunch and afternoon (4pm) 11/20/20 05/19/21 Yes Eino Farber, PA-C  mirabegron ER (MYRBETRIQ) 50 MG TB24 tablet TAKE 1 TABLET BY MOUTH ONCE DAILY Patient taking differently: Take 50 mg by mouth at bedtime. 02/16/20 02/15/21 Yes Pace, Johny Chess, MD  pilocarpine (SALAGEN) 5 MG tablet TAKE 1 TABLET BY MOUTH 4  TIMES DAILY Patient taking differently: Take 5 mg by mouth See admin instructions. Take one tablet (5 mg) by mouth four times daily - morning, lunch, afternoon (4pm) and bedtime 12/09/20 12/09/21 Yes Denita Lung, MD  polyethylene glycol (MIRALAX / GLYCOLAX) 17 g packet Take 8.5 g by mouth 2 (two) times daily. Mix in liquid and drink   Yes [provider]  estradiol (ESTRACE) 0.5 MG tablet TAKE 1 TABLET BY MOUTH ONCE A DAY Patient not taking: No sig reported 06/03/20  06/03/21  Vania Rea, MD  guanFACINE (TENEX) 2 MG tablet TAKE 1 TABLET BY MOUTH IN THE MORNING AND 1 TABLET IN THE EVENING AT BEDTIME Patient not taking: No sig reported 06/27/20 06/27/21  Eino Farber, PA-C  hydrochlorothiazide (HYDRODIURIL) 25 MG tablet TAKE 1 TABLET BY MOUTH ONCE A DAY Patient not taking: No sig reported 09/16/20 09/16/21  Lelon Perla, MD  promethazine (PHENERGAN) 25 MG suppository Place 1 suppository (25 mg total) rectally every 6 (six) hours as needed for nausea or vomiting. 10/08/20 11/05/20  Molpus, Jenny Reichmann, MD    Scheduled Meds: . enoxaparin (LOVENOX) injection  40 mg Subcutaneous Q24H  . pantoprazole (PROTONIX) IV  40 mg Intravenous Q24H  . thiamine  100 mg Oral Daily   Or  . thiamine  100 mg Intravenous Daily   Continuous Infusions: PRN Meds:.LORazepam **OR** LORazepam  Allergies as of 02/02/2021  . (No Known Allergies)    Family History  Problem Relation Age of Onset  . Mental illness Mother   . Hypertension Mother   . Heart failure Mother   . Mental illness Sister   . Mental illness Brother   . Mental illness Maternal Uncle   . Mental illness Maternal Grandmother   . Cancer Paternal Grandmother   . CAD Father        CABG at age 40    Social History   Socioeconomic History  . Marital status: Married    Spouse name: Not on file  . Number of children: 2  . Years of education: Not on file  . Highest education level: Not on file  Occupational History  . Not on file  Tobacco Use  . Smoking status: Never Smoker  . Smokeless tobacco: Never Used  Substance and Sexual Activity  . Alcohol use: Yes    Comment: 6 glasses per week  . Drug use: No  . Sexual activity: Not Currently  Other Topics Concern  . Not on file  Social History Narrative  . Not on file   Social Determinants of Health   Financial Resource Strain: Not on file  Food Insecurity: Not on file  Transportation Needs: Not on file  Physical Activity: Not on file  Stress: Not on  file  Social Connections: Not on file  Intimate Partner Violence: Not on file    Review of Systems: Review of Systems  Constitutional: Positive for chills. Negative for fever.  HENT: Negative for hearing loss and tinnitus.   Eyes: Negative for pain and redness.  Respiratory: Negative for cough and shortness of breath.   Cardiovascular: Negative for chest pain and palpitations.  Gastrointestinal: Positive for constipation, heartburn, nausea and vomiting. Negative for abdominal pain, blood in stool, diarrhea and melena.  Genitourinary: Negative for flank pain and hematuria.  Musculoskeletal: Negative for falls and joint pain.  Skin: Negative for itching and rash.  Neurological: Negative for seizures and loss of consciousness.  Endo/Heme/Allergies: Negative for polydipsia. Does not bruise/bleed easily.  Psychiatric/Behavioral: Negative for substance  abuse. The patient is not nervous/anxious.      Physical Exam: Vital signs: Vitals:   02/03/21 0102 02/03/21 0520  BP: 117/68 (!) 121/55  Pulse: 86 77  Resp: 14 14  Temp: 99.1 F (37.3 C) 99.2 F (37.3 C)  SpO2: 100% 100%     Physical Exam Vitals reviewed.  Constitutional:      General: She is not in acute distress.    Comments: Daughter at bedside  HENT:     Head: Normocephalic and atraumatic.     Nose: Nose normal. No congestion.     Mouth/Throat:     Mouth: Mucous membranes are moist.     Pharynx: Oropharynx is clear.  Eyes:     General: No scleral icterus.    Extraocular Movements: Extraocular movements intact.     Conjunctiva/sclera: Conjunctivae normal.  Cardiovascular:     Rate and Rhythm: Normal rate and regular rhythm.  Pulmonary:     Effort: Pulmonary effort is normal. No respiratory distress.  Abdominal:     General: There is no distension.     Palpations: Abdomen is soft. There is no mass.     Tenderness: There is no abdominal tenderness. There is no guarding or rebound.     Hernia: No hernia is present.      Comments: Hypoactive bowel sounds in the lower quadrants, bowel sounds present in the upper quadrants  Musculoskeletal:        General: No swelling or tenderness.     Cervical back: Normal range of motion and neck supple.  Skin:    General: Skin is warm and dry.  Neurological:     General: No focal deficit present.     Mental Status: She is oriented to person, place, and time. She is lethargic.  Psychiatric:        Mood and Affect: Mood normal.        Behavior: Behavior normal. Behavior is cooperative.     GI:  Lab Results: Recent Labs    02/02/21 0829  WBC 11.2*  HGB 14.1  HCT 41.0  PLT 324   BMET Recent Labs    02/02/21 0829 02/02/21 1932  NA 136 135  K 3.0* 3.7  CL 98 105  CO2 20* 19*  GLUCOSE 146* 110*  BUN 19 14  CREATININE 0.94 0.88  CALCIUM 10.4* 8.9   LFT Recent Labs    02/02/21 0829  PROT 8.1  ALBUMIN 5.0  AST 50*  ALT 82*  ALKPHOS 106  BILITOT 1.1   PT/INR No results for input(s): LABPROT, INR in the last 72 hours.   Studies/Results: CT Abdomen Pelvis Wo Contrast  Result Date: 02/02/2021 CLINICAL DATA:  Epigastric pain, vomiting since yesterday EXAM: CT ABDOMEN AND PELVIS WITHOUT CONTRAST TECHNIQUE: Multidetector CT imaging of the abdomen and pelvis was performed following the standard protocol without IV contrast. COMPARISON:  10/08/2020 FINDINGS: Lower chest: No acute abnormality.  Small hiatal hernia. Hepatobiliary: No solid liver abnormality is seen. No gallstones, gallbladder wall thickening, or biliary dilatation. Pancreas: Unremarkable. No pancreatic ductal dilatation or surrounding inflammatory changes. Spleen: Normal in size without significant abnormality. Adrenals/Urinary Tract: Adrenal glands are unremarkable. Kidneys are normal, without renal calculi, solid lesion, or hydronephrosis. Bladder is unremarkable. Stomach/Bowel: Stomach is within normal limits. Appendix appears normal. No evidence of bowel wall thickening, distention, or  inflammatory changes. Occasional descending and sigmoid diverticula. Vascular/Lymphatic: Scattered aortic atherosclerosis. No enlarged abdominal or pelvic lymph nodes. Reproductive: Status post hysterectomy. Other: No abdominal wall hernia  or abnormality. No abdominopelvic ascites. Musculoskeletal: No acute or significant osseous findings. IMPRESSION: 1. No non-contrast CT findings of the abdomen or pelvis to explain epigastric pain, nausea, or vomiting. No evidence of bowel obstruction. 2. Small hiatal hernia. 3. Occasional descending and sigmoid diverticula without evidence of acute diverticulitis. 4. Status post hysterectomy. Aortic Atherosclerosis (ICD10-I70.0). Electronically Signed   By: Eddie Candle M.D.   On: 02/02/2021 11:48    Impression: Intractable nausea and vomiting.  Possibly related to GERD.  Less likely gastric outlet obstruction, though concern for early bowel obstruction, given inability to pass flatus today and history of sigmoid fixation (likely related to prior hysterectomy)  GERD, improving on IV Protonix  QT prolongation  Plan: Abdominal films to rule out early obstruction.  Addendum: Plain films suggest early ileus.  Replete K+ to maintain > 4 to 4.5, Maintain Mg >2.  Proceed with EGD tomorrow for further evaluation.  I thoroughly discussed procedure with patient to include nature, alternatives, benefits, and risks (including but not limited to bleeding, infection, perforation, anesthesia/cardiac and pulmonary complications).  Patient verbalized understanding and gave verbal consent to proceed with EGD.  Continue Protonix if okay from a cardiac standpoint.  Consider rechecking EKG after electrolyte correction and hydration, at which point hopefully QT prolongation will improve and anti-emetics can be used as needed.  Addendum: per Leodis Sias, NPH, Emend 150 mg IV x 1 ordered.  Clear liquid diet OK today.  NPO at midnight.  [facility] will follow.    LOS: 0 days    Salley Slaughter  PA-C 02/03/2021, 8:43 AM  Contact #  [phone]

## 2021-02-04 ENCOUNTER — Encounter (HOSPITAL_COMMUNITY)
Admission: EM | Disposition: A | Discharge status: Home / Self Care | Payer: Self-pay | Source: Home / Self Care | Attending: Internal Medicine

## 2021-02-04 ENCOUNTER — Inpatient Hospital Stay (HOSPITAL_COMMUNITY): Payer: 59 | Admitting: Anesthesiology

## 2021-02-04 ENCOUNTER — Encounter (HOSPITAL_COMMUNITY): Payer: Self-pay | Admitting: Internal Medicine

## 2021-02-04 DIAGNOSIS — Z8673 Personal history of transient ischemic attack (TIA), and cerebral infarction without residual deficits: Secondary | ICD-10-CM | POA: Diagnosis not present

## 2021-02-04 DIAGNOSIS — R109 Unspecified abdominal pain: Secondary | ICD-10-CM | POA: Diagnosis present

## 2021-02-04 DIAGNOSIS — Z818 Family history of other mental and behavioral disorders: Secondary | ICD-10-CM | POA: Diagnosis not present

## 2021-02-04 DIAGNOSIS — Z90710 Acquired absence of both cervix and uterus: Secondary | ICD-10-CM | POA: Diagnosis not present

## 2021-02-04 DIAGNOSIS — K5730 Diverticulosis of large intestine without perforation or abscess without bleeding: Secondary | ICD-10-CM | POA: Diagnosis present

## 2021-02-04 DIAGNOSIS — Z79890 Hormone replacement therapy (postmenopausal): Secondary | ICD-10-CM | POA: Diagnosis not present

## 2021-02-04 DIAGNOSIS — Z8719 Personal history of other diseases of the digestive system: Secondary | ICD-10-CM | POA: Diagnosis not present

## 2021-02-04 DIAGNOSIS — E039 Hypothyroidism, unspecified: Secondary | ICD-10-CM | POA: Diagnosis present

## 2021-02-04 DIAGNOSIS — K2970 Gastritis, unspecified, without bleeding: Secondary | ICD-10-CM | POA: Diagnosis present

## 2021-02-04 DIAGNOSIS — Z8543 Personal history of malignant neoplasm of ovary: Secondary | ICD-10-CM | POA: Diagnosis not present

## 2021-02-04 DIAGNOSIS — M3500 Sicca syndrome, unspecified: Secondary | ICD-10-CM | POA: Diagnosis present

## 2021-02-04 DIAGNOSIS — Z809 Family history of malignant neoplasm, unspecified: Secondary | ICD-10-CM | POA: Diagnosis not present

## 2021-02-04 DIAGNOSIS — Z79899 Other long term (current) drug therapy: Secondary | ICD-10-CM | POA: Diagnosis not present

## 2021-02-04 DIAGNOSIS — K222 Esophageal obstruction: Secondary | ICD-10-CM | POA: Diagnosis present

## 2021-02-04 DIAGNOSIS — K2100 Gastro-esophageal reflux disease with esophagitis, without bleeding: Secondary | ICD-10-CM | POA: Diagnosis present

## 2021-02-04 DIAGNOSIS — E876 Hypokalemia: Secondary | ICD-10-CM | POA: Diagnosis present

## 2021-02-04 DIAGNOSIS — I444 Left anterior fascicular block: Secondary | ICD-10-CM | POA: Diagnosis present

## 2021-02-04 DIAGNOSIS — Z7982 Long term (current) use of aspirin: Secondary | ICD-10-CM | POA: Diagnosis not present

## 2021-02-04 DIAGNOSIS — R112 Nausea with vomiting, unspecified: Secondary | ICD-10-CM | POA: Diagnosis not present

## 2021-02-04 DIAGNOSIS — E785 Hyperlipidemia, unspecified: Secondary | ICD-10-CM | POA: Diagnosis present

## 2021-02-04 DIAGNOSIS — Z8249 Family history of ischemic heart disease and other diseases of the circulatory system: Secondary | ICD-10-CM | POA: Diagnosis not present

## 2021-02-04 DIAGNOSIS — I10 Essential (primary) hypertension: Secondary | ICD-10-CM | POA: Diagnosis present

## 2021-02-04 DIAGNOSIS — Z20822 Contact with and (suspected) exposure to covid-19: Secondary | ICD-10-CM | POA: Diagnosis present

## 2021-02-04 DIAGNOSIS — E872 Acidosis: Secondary | ICD-10-CM | POA: Diagnosis present

## 2021-02-04 HISTORY — PX: ESOPHAGOGASTRODUODENOSCOPY: SHX5428

## 2021-02-04 HISTORY — PX: BIOPSY: SHX5522

## 2021-02-04 LAB — CBC WITH DIFFERENTIAL/PLATELET
Abs Immature Granulocytes: 0.03 10*3/uL (ref 0.00–0.07)
Basophils Absolute: 0 10*3/uL (ref 0.0–0.1)
Basophils Relative: 0 %
Eosinophils Absolute: 0 10*3/uL (ref 0.0–0.5)
Eosinophils Relative: 1 %
HCT: 33.6 % — ABNORMAL LOW (ref 36.0–46.0)
Hemoglobin: 11.3 g/dL — ABNORMAL LOW (ref 12.0–15.0)
Immature Granulocytes: 0 %
Lymphocytes Relative: 44 %
Lymphs Abs: 3.3 10*3/uL (ref 0.7–4.0)
MCH: 30.7 pg (ref 26.0–34.0)
MCHC: 33.6 g/dL (ref 30.0–36.0)
MCV: 91.3 fL (ref 80.0–100.0)
Monocytes Absolute: 0.8 10*3/uL (ref 0.1–1.0)
Monocytes Relative: 11 %
Neutro Abs: 3.4 10*3/uL (ref 1.7–7.7)
Neutrophils Relative %: 44 %
Platelets: 200 10*3/uL (ref 150–400)
RBC: 3.68 MIL/uL — ABNORMAL LOW (ref 3.87–5.11)
RDW: 12.4 % (ref 11.5–15.5)
WBC: 7.7 10*3/uL (ref 4.0–10.5)
nRBC: 0 % (ref 0.0–0.2)

## 2021-02-04 LAB — BASIC METABOLIC PANEL
Anion gap: 8 (ref 5–15)
BUN: 15 mg/dL (ref 6–20)
CO2: 21 mmol/L — ABNORMAL LOW (ref 22–32)
Calcium: 8.2 mg/dL — ABNORMAL LOW (ref 8.9–10.3)
Chloride: 109 mmol/L (ref 98–111)
Creatinine, Ser: 0.59 mg/dL (ref 0.44–1.00)
GFR, Estimated: >60 mL/min (ref >60)
Glucose, Bld: 119 mg/dL — ABNORMAL HIGH (ref 70–99)
Potassium: 3.5 mmol/L (ref 3.5–5.1)
Sodium: 138 mmol/L (ref 135–145)

## 2021-02-04 SURGERY — EGD (ESOPHAGOGASTRODUODENOSCOPY)
Anesthesia: Monitor Anesthesia Care

## 2021-02-04 MED ORDER — PROPOFOL 10 MG/ML IV BOLUS
INTRAVENOUS | Status: DC | PRN
  Administered 2021-02-04: 30 mg via INTRAVENOUS

## 2021-02-04 MED ORDER — ACETAMINOPHEN 325 MG PO TABS: 650.0000 mg | ORAL_TABLET | Freq: Four times a day (QID) | ORAL | Status: DC | PRN

## 2021-02-04 MED ORDER — PROPOFOL 500 MG/50ML IV EMUL
INTRAVENOUS | Status: DC | PRN
  Administered 2021-02-04: 125 ug/kg/min via INTRAVENOUS

## 2021-02-04 MED ORDER — LACTATED RINGERS IV SOLN: INTRAVENOUS | Status: DC

## 2021-02-04 MED ORDER — PROPOFOL 500 MG/50ML IV EMUL
INTRAVENOUS | Status: AC
  Filled 2021-02-04: qty 50

## 2021-02-04 MED ORDER — SODIUM CHLORIDE 0.9 % IV SOLN: INTRAVENOUS | Status: DC

## 2021-02-04 MED ORDER — LIDOCAINE 2% (20 MG/ML) 5 ML SYRINGE
INTRAMUSCULAR | Status: DC | PRN
  Administered 2021-02-04: 60 mg via INTRAVENOUS

## 2021-02-04 MED ORDER — POTASSIUM CHLORIDE CRYS ER 20 MEQ PO TBCR
40.0000 meq | EXTENDED_RELEASE_TABLET | Freq: Once | ORAL | Status: AC
  Administered 2021-02-04: 40 meq via ORAL
  Filled 2021-02-04: qty 2

## 2021-02-04 MED ORDER — POTASSIUM CHLORIDE 10 MEQ/100ML IV SOLN: 10.0000 meq | INTRAVENOUS | Status: DC

## 2021-02-04 NOTE — Transfer of Care (Signed)
Immediate Anesthesia Transfer of Care Note  Patient: Dorothy Wood  Procedure(s) Performed: ESOPHAGOGASTRODUODENOSCOPY (EGD) (N/A ) BIOPSY of duodenum,antrum,gastric fundus, and distal esophagus  Patient Location: PACU and Endoscopy Unit  Anesthesia Type:MAC  Level of Consciousness: awake and drowsy  Airway & Oxygen Therapy: Patient Spontanous Breathing and Patient connected to face mask oxygen  Post-op Assessment: Report given to RN and Post -op Vital signs reviewed and stable  Post vital signs: Reviewed and stable  Last Vitals:  Vitals Value Taken Time  BP    Temp    Pulse 66 02/04/21 1432  Resp 15 02/04/21 1432  SpO2 100 % 02/04/21 1432  Vitals shown include unvalidated device data.  Last Pain:  Vitals:   02/04/21 1252  TempSrc: Oral  PainSc: 0-No pain         Complications: No complications documented.

## 2021-02-04 NOTE — Anesthesia Preprocedure Evaluation (Signed)
Anesthesia Evaluation  Patient identified by MRN, date of birth, ID band Patient awake    Reviewed: Allergy & Precautions, NPO status , Patient's Chart, lab work & pertinent test results  Airway Mallampati: II  TM Distance: >3 FB Neck ROM: Full    Dental no notable dental hx.    Pulmonary sleep apnea ,    Pulmonary exam normal breath sounds clear to auscultation       Cardiovascular hypertension, Normal cardiovascular exam Rhythm:Regular Rate:Normal     Neuro/Psych TIAnegative psych ROS   GI/Hepatic negative GI ROS, Neg liver ROS,   Endo/Other  Hypothyroidism   Renal/GU negative Renal ROS  negative genitourinary   Musculoskeletal negative musculoskeletal ROS (+)   Abdominal   Peds negative pediatric ROS (+)  Hematology negative hematology ROS (+)   Anesthesia Other Findings   Reproductive/Obstetrics negative OB ROS                             Anesthesia Physical Anesthesia Plan  ASA: II  Anesthesia Plan: MAC   Post-op Pain Management:    Induction: Intravenous  PONV Risk Score and Plan: 2 and Propofol infusion and Treatment may vary due to age or medical condition  Airway Management Planned: Simple Face Mask  Additional Equipment:   Intra-op Plan:   Post-operative Plan:   Informed Consent: I have reviewed the patients History and Physical, chart, labs and discussed the procedure including the risks, benefits and alternatives for the proposed anesthesia with the patient or authorized representative who has indicated his/her understanding and acceptance.     Dental advisory given  Plan Discussed with: CRNA and Surgeon  Anesthesia Plan Comments:         Anesthesia Quick Evaluation

## 2021-02-04 NOTE — Op Note (Signed)
[facility] Patient Name: Dorothy Wood Procedure Date: 02/04/2021 MRN: [id number] Attending MD: Ronald Lobo , MD Date of Birth: 1961-01-06 CSN: [id number] Age: 60 Admit Type: Inpatient Procedure:                Upper GI endoscopy Indications:              Nausea with vomiting Providers:                Ronald Lobo, MD, Katina Degree, RN, Lesia Sago, Technician, [address]. [name] CRNA, CRNA Referring MD:              Medicines:                Monitored Anesthesia Care Complications:            No immediate complications. Estimated Blood Loss:     Estimated blood loss was minimal. Procedure:                Pre-Anesthesia Assessment:                           - Prior to the procedure, a History and Physical                            was performed, and patient medications and                            allergies were reviewed. The patient's tolerance of                            previous anesthesia was also reviewed. The risks                            and benefits of the procedure and the sedation                            options and risks were discussed with the patient.                            All questions were answered, and informed consent                            was obtained. Prior Anticoagulants: The patient has                            taken no previous anticoagulant or antiplatelet                            agents. ASA Grade Assessment: II - A patient with                            mild systemic disease. After reviewing the risks  and benefits, the patient was deemed in                            satisfactory condition to undergo the procedure.                           After obtaining informed consent, the endoscope was                            passed under direct vision. Throughout the                            procedure, the patient's blood pressure, pulse, and                             oxygen saturations were monitored continuously. The                            [id number]-H190 ([id number] Olympus gastroscope was                            introduced through the mouth, and advanced to the                            second part of duodenum. The upper GI endoscopy was                            accomplished without difficulty. The patient                            tolerated the procedure well. Scope In: Scope Out: Findings:      Mildly severe esophagitis, characterized by linear erythema with minimal       erosive change, was found in the distal esophagus. Biopsies were taken       with a cold forceps for histology. Estimated blood loss was minimal.      A widely patent and mild Schatzki ring was found at the gastroesophageal       junction.      A 2 cm hiatal hernia was present.      Bilious fluid (small to moderate amount) was found on the greater       curvature of the stomach.      The exam of the stomach was otherwise normal.      The cardia and gastric fundus were normal on retroflexion.      There is no endoscopic evidence of erythema, erythema or inflammatory       changes suggestive of gastritis, ulceration or mass in the entire       examined stomach.      The examined duodenum was normal.      Biopsies were taken with a cold forceps in the gastric fundus, in the       gastric antrum and in the second portion of the duodenum for histology.       Estimated blood loss was minimal. Impression:               - Mild reflux esophagitis. Biopsied.                           -  Widely patent Schatzki ring.                           - 2 cm hiatal hernia.                           - Bilious gastric fluid without obvious bile reflux                            gastritis; this can be a normal finding.                           - Normal examined duodenum.                           - Biopsies were taken with a cold forceps for                            histology in the  gastric fundus, in the gastric                            antrum and in the second portion of the duodenum. Moderate Sedation:      This patient was sedated with monitored anesthesia care, not moderate       sedation. Recommendation:           - Await pathology results. Procedure Code(s):        --- Professional ---                           [phone], Esophagogastroduodenoscopy, flexible,                            transoral; with biopsy, single or multiple Diagnosis Code(s):        --- Professional ---                           K21.00, Gastro-esophageal reflux disease with                            esophagitis, without bleeding                           K22.2, Esophageal obstruction                           R11.2, Nausea with vomiting, unspecified CPT copyright 2019 [employer]. All rights reserved. The codes documented in this report are preliminary and upon coder review may  be revised to meet current compliance requirements. Ronald Lobo, MD 02/04/2021 2:35:42 PM This report has been signed electronically. Number of Addenda: 0

## 2021-02-04 NOTE — Interval H&P Note (Signed)
History and Physical Interval Note:  02/04/2021 2:00 PM  Dorothy Wood  has presented today for surgery, with the diagnosis of nausea and vomiting.  The various methods of treatment have been discussed with the patient. After consideration of risks, benefits and other options for treatment, the patient has consented to  Procedure(s): ESOPHAGOGASTRODUODENOSCOPY (EGD) (N/A) as a surgical intervention.  The patient's history has been reviewed, patient examined, no change in status, stable for surgery.  I have reviewed the patient's chart and labs.  Questions were answered to the patient's satisfaction.     Youlanda Mighty Elayah Klooster

## 2021-02-04 NOTE — Progress Notes (Signed)
PROGRESS NOTE    Dorothy Wood  ZOX:[id number] DOB: 11-05-1960 DOA: 02/02/2021 PCP: Dorothy Lung, MD    Brief Narrative:  Dorothy Wood was admitted to the hospital with the working diagnosis of intractable nausea and vomiting, suspected gastritis   60 year old female past medical history for Sjogren's syndrome, hypothyroidism, dyslipidemia, GERD, alcohol abuse, hypertension and colonic stricture who presented with abdominal pain, nausea and vomiting. Intractable symptoms, unable to take p.o. for about 48 hours.  Symptoms associated with burning epigastric abdominal pain, she drinks wine 3-4 times weekly.  On her initial physical examination the blood pressure was 129/61, heart rate 91, respiratory rate 15, temperature 98.4, oxygen saturation 100%, her lungs were clear to auscultation bilaterally, heart S1-S2, present, rhythmic, soft abdomen, no lower extremity edema.  Sodium 136, potassium 3.0, chloride 90, bicarb 20, glucose 146, BUN 19, creatinine 0.94, magnesium 1.6, white count 11.2, hemoglobin 14.1, hematocrit 41.0, platelets 324. SARS COVID-19 negative.  Urinalysis specific gravity 1.017   CT of the abdomen with small hiatal hernia, no acute findings.  EKG 69 bpm, left axis deviation, left anterior fascicular block, QTC 507, sinus rhythm, no significant ST segment changes, negative T wave lead III-aVF.  Patient placed on supportive medical therapy with IV fluids, antiacids, and as needed analgesics.  GI was consulted with plan for EGD today.    Assessment & Plan:   Principal Problem:   Gastritis Active Problems:   TIA (transient ischemic attack)   Essential hypertension   Intractable nausea and vomiting   Prolonged QT interval   High anion gap metabolic acidosis   1. Intractable nausea and vomiting, suspected gastritis. This am patient's pain is better, but not yet back to baseline. Yesterday she was on clear liquid diet and this am is NPO for endoscopic  procedure.   Plan to continue with IV fluids with dextrose, antiacids (pantoprazole 40 mg IV q12 hrs) and as needed analgesics Holding on antiemetics due to prolonged QTc  2. Hypokalemia, hypomagnesemia and anion gap metabolic acidosis. Prolonged QTc Renal function with serum cr at 0,59 with K 3,5 at and serum bicarbonate at 21.  Plan to add 40 meq Kcl IV and follow up on renal function in am. Advance diet post procedure, for now will continue with IV fluids with lactated ringers with dextrose.   Repeat EKG this am.   3. History of alcohol use. Clinically no signs of alcohol withdrawal, continue neuro checks per unit protocol. On as needed lorazepam po 1 mg for anxiety.   4. Hypertension, dyslipidemia, TIA. Blood pressure today 105/65 mmHg. Continue to hold on antihypertensive medications to prevent hypotension.  Continue with atorvastatin for dyslipidemia.   5. Attention deficit disorder. On bupropion and methylphenidate along with  guanfacin per home regimen.   6. Sjogren. On pylocarpine.   7. Hypothyroid. On levothyroxine.    Status is: Inpatient  Remains inpatient appropriate because:Inpatient level of care appropriate due to severity of illness   Dispo: The patient is from: Home              Anticipated d/c is to: Home              Patient currently is not medically stable to d/c. possible dc home in the next 24 hrs,    Difficult to place patient No   DVT prophylaxis: Enoxaparin   Code Status:   full  Family Communication:  I spoke with patient's daughter at the bedside, we talked in detail about patient's condition, plan  of care and prognosis and all questions were addressed.    Consultants:   GI     Subjective: Patient is feeling better, but not yet back to baseline, continue to have abdominal pain but no further nausea and vomiting, she has been NPO past midnight in preparation for today's procedure.   Objective: Vitals:   02/03/21 1345 02/03/21  1418 02/03/21 2212 02/04/21 0426  BP: (!) 142/61 (!) 142/61 136/71 105/65  Pulse: 71  80 63  Resp: 16  20 18   Temp: 99.1 F (37.3 C)  98.6 F (37 C) 98.2 F (36.8 C)  TempSrc: Oral  Oral Oral  SpO2: 100%  97% 98%  Weight:      Height:        Intake/Output Summary (Last 24 hours) at 02/04/2021 1221 Last data filed at 02/03/2021 2215 Gross per 24 hour  Intake 1140 ml  Output 400 ml  Net 740 ml   Filed Weights   02/02/21 1833  Weight: 58.4 kg    Examination:   General: Not in pain or dyspnea, deconditioned  Neurology: Awake and alert, non focal. No tremors.  E ENT: no pallor, no icterus, oral mucosa moist Cardiovascular: No JVD. S1-S2 present, rhythmic, no gallops, rubs, or murmurs. No lower extremity edema. Pulmonary: positive breath sounds bilaterally, adequate air movement, no wheezing, rhonchi or rales. Gastrointestinal. Abdomen soft and non tender Skin. No rashes Musculoskeletal: no joint deformities     Data Reviewed: I have personally reviewed following labs and imaging studies  CBC: Recent Labs  Lab 02/02/21 0829 02/04/21 0547  WBC 11.2* 7.7  NEUTROABS  --  3.4  HGB 14.1 11.3*  HCT 41.0 33.6*  MCV 87.6 91.3  PLT 324 244   Basic Metabolic Panel: Recent Labs  Lab 02/02/21 0829 02/02/21 1932 02/03/21 0635 02/03/21 1313 02/04/21 0547  NA 136 135  --  135 138  K 3.0* 3.7  --  3.2* 3.5  CL 98 105  --  106 109  CO2 20* 19*  --  22 21*  GLUCOSE 146* 110*  --  113* 119*  BUN 19 14  --  17 15  CREATININE 0.94 0.88  --  0.68 0.59  CALCIUM 10.4* 8.9  --  8.8* 8.2*  MG 1.6*  --  2.6*  --   --   PHOS  --  2.6  --   --   --    GFR: Estimated Creatinine Clearance: 67.3 mL/min (by C-G formula based on SCr of 0.59 mg/dL). Liver Function Tests: Recent Labs  Lab 02/02/21 0829  AST 50*  ALT 82*  ALKPHOS 106  BILITOT 1.1  PROT 8.1  ALBUMIN 5.0   Recent Labs  Lab 02/02/21 0829  LIPASE 24   No results for input(s): AMMONIA in the last 168  hours. Coagulation Profile: No results for input(s): INR, PROTIME in the last 168 hours. Cardiac Enzymes: No results for input(s): CKTOTAL, CKMB, CKMBINDEX, TROPONINI in the last 168 hours. BNP (last 3 results) No results for input(s): PROBNP in the last 8760 hours. HbA1C: Recent Labs    02/02/21 1932  HGBA1C 6.0*   CBG: No results for input(s): GLUCAP in the last 168 hours. Lipid Profile: No results for input(s): CHOL, HDL, LDLCALC, TRIG, CHOLHDL, LDLDIRECT in the last 72 hours. Thyroid Function Tests: No results for input(s): TSH, T4TOTAL, FREET4, T3FREE, THYROIDAB in the last 72 hours. Anemia Panel: No results for input(s): VITAMINB12, FOLATE, FERRITIN, TIBC, IRON, RETICCTPCT in the last 72 hours.  Radiology Studies: I have reviewed all of the imaging during this hospital visit personally     Scheduled Meds: . atorvastatin  40 mg Oral QHS  . buPROPion  300 mg Oral q morning  . enoxaparin (LOVENOX) injection  40 mg Subcutaneous Q24H  . guanFACINE  2 mg Oral BID  . levothyroxine  88 mcg Oral Q0600  . methylphenidate  20 mg Oral 3 times per day  . mirabegron ER  50 mg Oral QHS  . pantoprazole (PROTONIX) IV  40 mg Intravenous Q12H  . pilocarpine  5 mg Oral 4 times per day  . thiamine  100 mg Oral Daily   Continuous Infusions: . dextrose 5 % and 0.9% NaCl 1,000 mL (02/03/21 1421)     LOS: 0 days        Cashis Rill Gerome Apley, MD

## 2021-02-04 NOTE — Anesthesia Procedure Notes (Signed)
Procedure Name: MAC Date/Time: 02/04/2021 2:13 PM Performed by: Lollie Sails, CRNA Pre-anesthesia Checklist: Patient identified, Emergency Drugs available, Suction available, Patient being monitored and Timeout performed Preoxygenation: POM Used.

## 2021-02-04 NOTE — Anesthesia Postprocedure Evaluation (Signed)
Anesthesia Post Note  Patient: Dorothy Wood  Procedure(s) Performed: ESOPHAGOGASTRODUODENOSCOPY (EGD) (N/A ) BIOPSY of duodenum,antrum,gastric fundus, and distal esophagus     Patient location during evaluation: PACU Anesthesia Type: MAC Level of consciousness: awake and alert Pain management: pain level controlled Vital Signs Assessment: post-procedure vital signs reviewed and stable Respiratory status: spontaneous breathing, nonlabored ventilation, respiratory function stable and patient connected to nasal cannula oxygen Cardiovascular status: stable and blood pressure returned to baseline Postop Assessment: no apparent nausea or vomiting Anesthetic complications: no   No complications documented.  Last Vitals:  Vitals:   02/04/21 1450 02/04/21 1516  BP: 125/78 132/81  Pulse: 63 62  Resp: 11 12  Temp:  36.4 C  SpO2: 100% 100%    Last Pain:  Vitals:   02/04/21 1516  TempSrc: Oral  PainSc:                  [name] S

## 2021-02-05 ENCOUNTER — Encounter (HOSPITAL_COMMUNITY): Payer: Self-pay | Admitting: Gastroenterology

## 2021-02-05 ENCOUNTER — Other Ambulatory Visit (HOSPITAL_COMMUNITY): Payer: Self-pay

## 2021-02-05 DIAGNOSIS — I10 Essential (primary) hypertension: Secondary | ICD-10-CM | POA: Diagnosis not present

## 2021-02-05 DIAGNOSIS — R112 Nausea with vomiting, unspecified: Secondary | ICD-10-CM | POA: Diagnosis not present

## 2021-02-05 DIAGNOSIS — K2970 Gastritis, unspecified, without bleeding: Secondary | ICD-10-CM | POA: Diagnosis not present

## 2021-02-05 LAB — BASIC METABOLIC PANEL
Anion gap: 3 — ABNORMAL LOW (ref 5–15)
BUN: 11 mg/dL (ref 6–20)
CO2: 27 mmol/L (ref 22–32)
Calcium: 8.4 mg/dL — ABNORMAL LOW (ref 8.9–10.3)
Chloride: 110 mmol/L (ref 98–111)
Creatinine, Ser: 0.65 mg/dL (ref 0.44–1.00)
GFR, Estimated: >60 mL/min (ref >60)
Glucose, Bld: 102 mg/dL — ABNORMAL HIGH (ref 70–99)
Potassium: 4.1 mmol/L (ref 3.5–5.1)
Sodium: 140 mmol/L (ref 135–145)

## 2021-02-05 LAB — VITAMIN B12: Vitamin B-12: 322 pg/mL (ref 180–914)

## 2021-02-05 LAB — SURGICAL PATHOLOGY

## 2021-02-05 MED ORDER — POLYETHYLENE GLYCOL 3350 17 G PO PACK
17.0000 g | PACK | Freq: Every day | ORAL | Status: DC
  Administered 2021-02-05: 17 g via ORAL

## 2021-02-05 MED ORDER — PANTOPRAZOLE SODIUM 40 MG PO TBEC
40.0000 mg | DELAYED_RELEASE_TABLET | Freq: Every day | ORAL | 0 refills | Status: DC
  Filled 2021-02-05: qty 30, 30d supply, fill #0

## 2021-02-05 NOTE — Progress Notes (Signed)
Pt discharged home with all belongings. Discharge education reviewed with pt. No questions or concerns at time of discharge. Encouraged pt to follow up with PCP for any concerns after discharge.

## 2021-02-05 NOTE — Progress Notes (Signed)
Afebrile.  Potassium repleted, currently 4.1.  Tolerating full liquid diet.  No n/v.  IMPR:  Transient upper intestinal tract illness characterized primarily by n/v, etiology unclear  (?? Infectious, ?? Gastric dysmotility)  RECOMM:  1. Have advanced to soft diet  2. If soft diet tolerated, ok for dischg later today.  Have advised pt to have freq small meals with low residue content, until she is feeling back to normal, then can have unrestricted diet.  3. No f/u appt needed with me in the office, as long as she remains free of sx.  Can f/u w/ me PRN.  4. I will contact pt w/ bx results when available, and if they indicate need for some sort of treatment, I will arrange for that.  5. Agree w/ plan for PPI after dischg, in view of esophagitis seen on yesterday's egd.  Would estimate 1-2 mos of treatment would be sufficient, and thereafter PRN for heartburn sx (which she normally does not have). No clear indication for ongoing or permanent PPI therapy.  Cleotis Nipper, M.D. Pager [phone] If no answer or after 5 PM call [phone]

## 2021-02-05 NOTE — Discharge Summary (Signed)
Physician Discharge Summary  Dorothy Wood XTK:[id number] DOB: 1961-06-24 DOA: 02/02/2021  PCP: Denita Lung, MD  Admit date: 02/02/2021 Discharge date: 02/05/2021  Time spent: 35 minutes  Recommendations for Outpatient Follow-up:  1. PCP in 1 week   Discharge Diagnoses:  Esophagitis Intractable nausea and vomiting Hypothyroidism History of Sjogren's syndrome   TIA (transient ischemic attack)   Essential hypertension   Intractable nausea and vomiting   Prolonged QT interval   High anion gap metabolic acidosis   Discharge Condition: Stable  Diet recommendation: Regular  Filed Weights   02/02/21 1833  Weight: 58.4 kg    History of present illness:  60 year old female past medical history for Sjogren's syndrome, hypothyroidism, dyslipidemia, GERD, alcohol abuse, hypertension and colonic stricture who presented with abdominal pain, nausea and vomiting.Intractable symptoms, unable to take p.o. for about 48 hours. Symptoms associated with burning epigastric abdominal pain, she drinks wine 3-4 times weekly. CT abd pelvis was unremarkable  Hospital Course:   Intractable nausea and vomiting/esophagitis -Improved with bowel rest, PPI and supportive care -Gastroenterology consulted, underwent endoscopy yesterday this was notable for esophagitis, biopsied, hiatal hernia -Improved and tolerating diet now -Discharged home on oral PPI for 1 to 2 months, recommended cutting back on alcohol  Hypokalemia, hypomagnesemia and anion gap metabolic acidosis.  -Repleted  History of alcohol use.  -Counseled, no evidence of alcohol withdrawal noted  Hypertension, dyslipidemia, TIA -Home meds resumed -Continue Lipitor  Attention deficit disorder. On bupropion and methylphenidate along with  guanfacin per home regimen.   Sjogren. On pylocarpine.  -Follow-up with rheumatology   Hypothyroid. On levothyroxine.  Discharge Exam: Vitals:   02/05/21 0546 02/05/21 1248  BP: (!)  104/53 114/60  Pulse: 62 84  Resp: 16 16  Temp: 98 F (36.7 C) 97.9 F (36.6 C)  SpO2: 100% 100%    General: AAOx3 Cardiovascular: S1S2/RRR Respiratory: CTAB  Discharge Instructions   Discharge Instructions    Ambulatory referral to Rheumatology   Complete by: As directed    Shogren's syndrome, needs FU   Diet - low sodium heart healthy   Complete by: As directed    Increase activity slowly   Complete by: As directed      Allergies as of 02/05/2021   No Known Allergies     Medication List    TAKE these medications   aspirin EC 81 MG tablet Take 81 mg by mouth every morning. Swallow whole.   atorvastatin 40 MG tablet Commonly known as: LIPITOR TAKE 1 TABLET BY MOUTH ONCE DAILY What changed:   how much to take  when to take this   buPROPion 300 MG 24 hr tablet Commonly known as: WELLBUTRIN XL TAKE 1 TABLET BY MOUTH EVERY MORNING What changed: how much to take   estradiol 0.5 MG tablet Commonly known as: ESTRACE TAKE 1 TABLET BY MOUTH ONCE A DAY   guanFACINE 2 MG tablet Commonly known as: TENEX TAKE 1 TABLET BY MOUTH EVERY MORNING AND 1 TABLET AT BEDTIME What changed:   how much to take  how to take this  when to take this   hydrochlorothiazide 25 MG tablet Commonly known as: HYDRODIURIL TAKE 1 TABLET BY MOUTH ONCE DAILY What changed:   how much to take  when to take this   levothyroxine 88 MCG tablet Commonly known as: SYNTHROID TAKE 1 TABLET BY MOUTH ONCE DAILY What changed:   how much to take  when to take this   LORazepam 1 MG tablet Commonly known as:  ATIVAN Take 1 mg by mouth at bedtime as needed for anxiety or sleep.   Myrbetriq 50 MG Tb24 tablet Generic drug: mirabegron ER TAKE 1 TABLET BY MOUTH ONCE DAILY What changed:   how much to take  when to take this   pantoprazole 40 MG tablet Commonly known as: Protonix Take 1 tablet (40 mg total) by mouth daily.   pilocarpine 5 MG tablet Commonly known as: SALAGEN TAKE  1 TABLET BY MOUTH 4 TIMES DAILY What changed:   how much to take  when to take this  additional instructions   polyethylene glycol 17 g packet Commonly known as: MIRALAX / GLYCOLAX Take 8.5 g by mouth 2 (two) times daily. Mix in liquid and drink   Ritalin 20 MG tablet Generic drug: methylphenidate TAKE 1 TABLET BY MOUTH 3 TIMES DAILY (FILL 01/04/21) What changed:   how much to take  how to take this  when to take this  additional instructions      No Known Allergies  Follow-up Information    Denita Lung, MD. Schedule an appointment as soon as possible for a visit in 1 week(s).   Specialty: Family Medicine Contact information: [place] [address] [phone]                The results of significant diagnostics from this hospitalization (including imaging, microbiology, ancillary and laboratory) are listed below for reference.    Significant Diagnostic Studies: CT Abdomen Pelvis Wo Contrast  Result Date: 02/02/2021 CLINICAL DATA:  Epigastric pain, vomiting since yesterday EXAM: CT ABDOMEN AND PELVIS WITHOUT CONTRAST TECHNIQUE: Multidetector CT imaging of the abdomen and pelvis was performed following the standard protocol without IV contrast. COMPARISON:  10/08/2020 FINDINGS: Lower chest: No acute abnormality.  Small hiatal hernia. Hepatobiliary: No solid liver abnormality is seen. No gallstones, gallbladder wall thickening, or biliary dilatation. Pancreas: Unremarkable. No pancreatic ductal dilatation or surrounding inflammatory changes. Spleen: Normal in size without significant abnormality. Adrenals/Urinary Tract: Adrenal glands are unremarkable. Kidneys are normal, without renal calculi, solid lesion, or hydronephrosis. Bladder is unremarkable. Stomach/Bowel: Stomach is within normal limits. Appendix appears normal. No evidence of bowel wall thickening, distention, or inflammatory changes. Occasional descending and sigmoid diverticula.  Vascular/Lymphatic: Scattered aortic atherosclerosis. No enlarged abdominal or pelvic lymph nodes. Reproductive: Status post hysterectomy. Other: No abdominal wall hernia or abnormality. No abdominopelvic ascites. Musculoskeletal: No acute or significant osseous findings. IMPRESSION: 1. No non-contrast CT findings of the abdomen or pelvis to explain epigastric pain, nausea, or vomiting. No evidence of bowel obstruction. 2. Small hiatal hernia. 3. Occasional descending and sigmoid diverticula without evidence of acute diverticulitis. 4. Status post hysterectomy. Aortic Atherosclerosis (ICD10-I70.0). Electronically Signed   By: Eddie Candle M.D.   On: 02/02/2021 11:48   DG Abd 2 Views  Result Date: 02/03/2021 CLINICAL DATA:  History of Sjogren's, hypothyroidism and hypertension. Nausea and vomiting. EXAM: ABDOMEN - 2 VIEW COMPARISON:  June 11, 2004. CT of the abdomen and pelvis dated Feb 02, 2021. FINDINGS: Minimal atelectasis at the LEFT lung base. EKG leads project over the lower chest and upper abdomen. Upright radiograph without signs of free air beneath either the RIGHT or LEFT hemidiaphragm. Bowel gas pattern with mild gaseous distension of small bowel up to 2.7 cm. No abnormal distension of the colon.  No definite rectal gas. No acute skeletal process on limited assessment. No abnormal calcifications. IMPRESSION: 1. Mild gaseous distension of small bowel up to 2.7 cm. No abnormal distension of the colon.  Findings favor developing ileus w. attention on follow-up given lack of definite rectal gas. 2. Minimal atelectasis at the LEFT lung base. Electronically Signed   By: Zetta Bills M.D.   On: 02/03/2021 13:47    Microbiology: Recent Results (from the past 240 hour(s))  Resp Panel by RT-PCR (Flu A&B, Covid) Nasopharyngeal Swab     Status: None   Collection Time: 02/02/21  2:36 PM   Specimen: Nasopharyngeal Swab; Nasopharyngeal(NP) swabs in vial transport medium  Result Value Ref Range Status    SARS Coronavirus 2 by RT PCR NEGATIVE NEGATIVE Final    Comment: (NOTE) SARS-CoV-2 target nucleic acids are NOT DETECTED.  The SARS-CoV-2 RNA is generally detectable in upper respiratory specimens during the acute phase of infection. The lowest concentration of SARS-CoV-2 viral copies this assay can detect is 138 copies/mL. A negative result does not preclude SARS-Cov-2 infection and should not be used as the sole basis for treatment or other patient management decisions. A negative result may occur with  improper specimen collection/handling, submission of specimen other than nasopharyngeal swab, presence of viral mutation(s) within the areas targeted by this assay, and inadequate number of viral copies(<138 copies/mL). A negative result must be combined with clinical observations, patient history, and epidemiological information. The expected result is Negative.  Fact Sheet for Patients:  EntrepreneurPulse.com.au  Fact Sheet for Healthcare Providers:  IncredibleEmployment.be  This test is no t yet approved or cleared by the [place] FDA and  has been authorized for detection and/or diagnosis of SARS-CoV-2 by FDA under an Emergency Use Authorization (EUA). This EUA will remain  in effect (meaning this test can be used) for the duration of the COVID-19 declaration under Section 564(b)(1) of the Act, 21 U.S.C.section 360bbb-3(b)(1), unless the authorization is terminated  or revoked sooner.       Influenza A by PCR NEGATIVE NEGATIVE Final   Influenza B by PCR NEGATIVE NEGATIVE Final    Comment: (NOTE) The Xpert Xpress SARS-CoV-2/FLU/RSV plus assay is intended as an aid in the diagnosis of influenza from Nasopharyngeal swab specimens and should not be used as a sole basis for treatment. Nasal washings and aspirates are unacceptable for Xpert Xpress SARS-CoV-2/FLU/RSV testing.  Fact Sheet for  Patients: EntrepreneurPulse.com.au  Fact Sheet for Healthcare Providers: IncredibleEmployment.be  This test is not yet approved or cleared by the [place] FDA and has been authorized for detection and/or diagnosis of SARS-CoV-2 by FDA under an Emergency Use Authorization (EUA). This EUA will remain in effect (meaning this test can be used) for the duration of the COVID-19 declaration under Section 564(b)(1) of the Act, 21 U.S.C. section 360bbb-3(b)(1), unless the authorization is terminated or revoked.  Performed at [facility], [place] [address] [place], [place] [address]      Labs: Basic Metabolic Panel: Recent Labs  Lab 02/02/21 0829 02/02/21 1932 02/03/21 0635 02/03/21 1313 02/04/21 0547 02/05/21 0533  NA 136 135  --  135 138 140  K 3.0* 3.7  --  3.2* 3.5 4.1  CL 98 105  --  106 109 110  CO2 20* 19*  --  22 21* 27  GLUCOSE 146* 110*  --  113* 119* 102*  BUN 19 14  --  17 15 11   CREATININE 0.94 0.88  --  0.68 0.59 0.65  CALCIUM 10.4* 8.9  --  8.8* 8.2* 8.4*  MG 1.6*  --  2.6*  --   --   --   PHOS  --  2.6  --   --   --   --  Liver Function Tests: Recent Labs  Lab 02/02/21 0829  AST 50*  ALT 82*  ALKPHOS 106  BILITOT 1.1  PROT 8.1  ALBUMIN 5.0   Recent Labs  Lab 02/02/21 0829  LIPASE 24   No results for input(s): AMMONIA in the last 168 hours. CBC: Recent Labs  Lab 02/02/21 0829 02/04/21 0547  WBC 11.2* 7.7  NEUTROABS  --  3.4  HGB 14.1 11.3*  HCT 41.0 33.6*  MCV 87.6 91.3  PLT 324 200   Cardiac Enzymes: No results for input(s): CKTOTAL, CKMB, CKMBINDEX, TROPONINI in the last 168 hours. BNP: BNP (last 3 results) No results for input(s): BNP in the last 8760 hours.  ProBNP (last 3 results) No results for input(s): PROBNP in the last 8760 hours.  CBG: No results for input(s): GLUCAP in the last 168 hours.     Signed:  Domenic Polite MD.  [facility] 02/05/2021,  1:05 PM

## 2021-02-06 ENCOUNTER — Other Ambulatory Visit: Payer: Self-pay | Admitting: *Deleted

## 2021-02-06 NOTE — Patient Outreach (Signed)
[facility]) Care Management  02/06/2021  Dorothy Wood October 21, 1960 [id number]   Transition of care telephone call  Referral received:02/06/24 Initial outreach:02/06/21 Insurance: [facility]   Initial unsuccessful telephone call to patient's preferred number in order to complete transition of care assessment; no answer, left HIPAA compliant voicemail message requesting return call.   Objective: Per the electronic medical record, [name]   was hospitalized at [facility] 5/22-5/25/22 for Gastritis, underwent  EGD notable for esophagitis   . Comorbidities include: Sjogren's syndrome, hypothyroidism, dyslipidemia, GERD, alcohol abuse, hypertension. She was discharged to home on 02/05/21  without the need for home health services or durable medical equipment per the discharge summary.   Plan: This RNCM will route unsuccessful outreach letter with [place] Management pamphlet and 24 hour Nurse Advice Line Magnet to [facility] [employer] clinical pool to be mailed to patient's home address. This RNCM will attempt another outreach within 4 business days.   Joylene Draft, RN, BSN  [place] Management Coordinator  [phone]- Mobile [phone]- Toll Free Main Office

## 2021-02-07 ENCOUNTER — Other Ambulatory Visit (HOSPITAL_COMMUNITY): Payer: Self-pay

## 2021-02-10 ENCOUNTER — Other Ambulatory Visit: Payer: Self-pay

## 2021-02-10 MED FILL — Hydrochlorothiazide Tab 25 MG: ORAL | 90 days supply | Qty: 90 | Fill #0 | Status: AC

## 2021-02-11 ENCOUNTER — Encounter: Payer: Self-pay | Admitting: Family Medicine

## 2021-02-11 ENCOUNTER — Other Ambulatory Visit (HOSPITAL_COMMUNITY): Payer: Self-pay

## 2021-02-11 ENCOUNTER — Other Ambulatory Visit: Payer: Self-pay | Admitting: *Deleted

## 2021-02-11 MED FILL — Guanfacine HCl Tab 2 MG: ORAL | 30 days supply | Qty: 60 | Fill #1 | Status: AC

## 2021-02-11 NOTE — Patient Outreach (Signed)
Dorothy Wood [facility]) Care Management  02/11/2021  Dorothy Wood Dec 27, 1960 [id number]   Transition of care call Referral received: 02/05/21 Initial outreach attempt:02/06/21 Insurance: [employer]    2nd unsuccessful telephone call to patient's preferred contact number in order to complete post hospital discharge transition of care assessment , no answer left HIPAA compliant message requesting return call.    Objective: Per the electronic medical record, [name]   was hospitalized at [facility] 5/22-5/25/22 for Gastritis, underwent  EGD notable for esophagitis   . Comorbidities include: Sjogren's syndrome, hypothyroidism, dyslipidemia, GERD, alcohol abuse, hypertension. She was discharged to home on 02/05/21  without the need for home health services or durable medical equipment per the discharge summary.   Plan If no return call from patient will attempt 3rd outreach in the next 4 business days.   Joylene Draft, RN, BSN  [facility] Management Coordinator  [phone]- Mobile [phone]- Toll Free Main Office

## 2021-02-12 ENCOUNTER — Other Ambulatory Visit (HOSPITAL_COMMUNITY): Payer: Self-pay

## 2021-02-12 MED ORDER — MYRBETRIQ 50 MG PO TB24
ORAL_TABLET | ORAL | 1 refills | Status: DC
  Filled 2021-02-12: qty 30, 30d supply, fill #0
  Filled 2021-03-23: qty 30, 30d supply, fill #1

## 2021-02-14 ENCOUNTER — Other Ambulatory Visit: Payer: Self-pay | Admitting: *Deleted

## 2021-02-14 ENCOUNTER — Other Ambulatory Visit (HOSPITAL_COMMUNITY): Payer: Self-pay

## 2021-02-14 NOTE — Patient Outreach (Signed)
[facility]) Care Management  02/14/2021  NIEMA CARRARA [birth year] [id number]   Transition of care call Referral received: 02/05/21 Initial outreach attempt: 02/06/21 Insurance: [place]   #3 Outreach attempt  Third unsuccessful telephone call to patient's preferred contact number in order to complete post hospital discharge transition of care assessment; no answer, left HIPAA compliant message requesting return call.   Objective: Per the electronic medical record, Mrs. Dawnmarie Breon hospitalized at [facility] 5/22-5/25/22 for Gastritis, underwent EGD notable for esophagitis . Comorbidities include: Sjogren's syndrome, hypothyroidism, dyslipidemia, GERD, hypertension. Shewas discharged to home on 5/25/22without the need for home health services or durable medical equipment per the discharge summary.     Plan: If no return call from patient, will plan return call in the next 3 weeks.   Joylene Draft, RN, BSN  [place] Management Coordinator  [phone]- Mobile [phone]- Toll Free Main Office

## 2021-02-15 ENCOUNTER — Other Ambulatory Visit (HOSPITAL_COMMUNITY): Payer: Self-pay

## 2021-02-15 MED ORDER — LORAZEPAM 1 MG PO TABS
ORAL_TABLET | ORAL | 1 refills | Status: DC
  Filled 2021-02-15: qty 15, 30d supply, fill #0

## 2021-02-17 ENCOUNTER — Encounter: Payer: Self-pay | Admitting: Family Medicine

## 2021-02-17 ENCOUNTER — Telehealth (INDEPENDENT_AMBULATORY_CARE_PROVIDER_SITE_OTHER): Payer: 59 | Admitting: Family Medicine

## 2021-02-17 ENCOUNTER — Other Ambulatory Visit: Payer: Self-pay

## 2021-02-17 VITALS — BP 130/76 | HR 90 | Wt 128.0 lb

## 2021-02-17 DIAGNOSIS — M3500 Sicca syndrome, unspecified: Secondary | ICD-10-CM

## 2021-02-17 DIAGNOSIS — I700 Atherosclerosis of aorta: Secondary | ICD-10-CM

## 2021-02-17 DIAGNOSIS — R112 Nausea with vomiting, unspecified: Secondary | ICD-10-CM | POA: Diagnosis not present

## 2021-02-17 NOTE — Progress Notes (Signed)
   Subjective:    Patient ID: [place], female    DOB: 07-Jun-1961, 60 y.o.   MRN: [id number]  HPI Documentation for virtual audio and video telecommunications through [place] encounter: The patient was located at home. 2 patient identifiers used.  The provider was located in the office. The patient did consent to this visit and is aware of possible charges through their insurance for this visit. The other persons participating in this telemedicine service were none. Time spent on call was 10 minutes and in review of previous records >35 minutes total for counseling and coordination of care. This virtual service is not related to other E/M service within previous 7 days. Today's visit is for follow-up after recent hospitalization and treatment for prolonged nausea and vomiting.  Prior to going to the hospital she used Zofran, Compazine and Phenergan to control her vomiting but was unable to do that.  She was admitted for IV hydration and subsequently has done quite well.  She is now back to her normal eating habits.  She did avoid roughage for quite some time as there was a question of partial small bowel obstruction.  Also CT scan did show evidence of aortic atherosclerosis.  She is presently on a statin drug. She has a several decade history of Sjogren syndrome.  Initially she was given pilocarpine by her physician but then switched to getting it from her dentist and most recently I have continued this medication but she has not seen rheumatology for follow-up on this.  Review of Systems     Objective:   Physical Exam Alert and in no distress otherwise not examined.  The hospital record including emergency room and discharge summary as well as lab data was reviewed.       Assessment & Plan:  Intractable vomiting with nausea, unspecified vomiting type  Sjogren's syndrome, with unspecified organ involvement ([place]) - Plan: Ambulatory referral to Rheumatology  Aortic  atherosclerosis ([place]) She may resume regular diet.  She will continue on her statin drug.  Referral will be made for rheumatology and has been several years since she has had this evaluated.

## 2021-02-18 ENCOUNTER — Other Ambulatory Visit (HOSPITAL_COMMUNITY): Payer: Self-pay

## 2021-02-18 ENCOUNTER — Other Ambulatory Visit: Payer: Self-pay

## 2021-02-18 MED FILL — Levothyroxine Sodium Tab 88 MCG: ORAL | 90 days supply | Qty: 90 | Fill #0 | Status: AC

## 2021-02-19 ENCOUNTER — Other Ambulatory Visit (HOSPITAL_COMMUNITY): Payer: Self-pay

## 2021-02-19 ENCOUNTER — Other Ambulatory Visit: Payer: Self-pay | Admitting: *Deleted

## 2021-02-19 ENCOUNTER — Other Ambulatory Visit: Payer: Self-pay

## 2021-02-19 NOTE — Patient Outreach (Signed)
[facility]) Care Management  02/19/2021  Dorothy Wood Feb 10, 1961 [id number]   Transition of care call/case closure   Referral received:01/16/21 Initial outreach:02/06/21 Insurance: [place] [employer]    Subjective: Incoming Call from patient  Explained purpose of call and completed transition of care assessment.  Dorothy Wood states that she is doing better. She reports tolerating diet, without worsening symptoms noted.  She has been able to return to some of usual activity. She denies bowel or bladder problems. Reviewed accessing the following [place] Benefits : She discussed being enrolled in of the [place] chronic disease management programs for chronic medical conditions.  She does have the hospital indemnity plan and has filed a claim  She uses a Ambulance person.     Objective:   Dorothy Wood   was hospitalized at [facility] 5/22-5/25/22 for Gastritis, underwent  EGD notable for esophagitis   . Comorbidities include: Sjogren's syndrome, hypothyroidism, dyslipidemia, GERD, hypertension. She was discharged to home on 02/05/21  without the need for home health services or durable medical equipment per the discharge summary.   Assessment:  Patient voices good understanding of all discharge instructions.  See transition of care flowsheet for assessment details.   Plan:  Reviewed hospital discharge diagnosis of Gastritis   and discharge treatment plan using hospital discharge instructions, assessing medication adherence, reviewing problems requiring provider notification, and discussing the importance of follow up with surgeon, primary care provider and/or specialists as directed.  Reviewed [place] healthy lifestyle program information to receive discounted premium for  2023   Step 1: Get  your annual physical  Step 2: Complete your health assessment  Step 3:Identify your current health status and complete the corresponding action step  between September 14, 2020 and May 15, 2021.    Using [place] website, verified that patient is an active participate in [place]'s Active Health Management chronic disease management program.    No ongoing care management needs identified so will close case to [place] Management services .Thanked patient for their services to [facility].  Joylene Draft, RN, BSN  [place] Management Coordinator  [phone]- Mobile [phone]- Toll Free Main Office

## 2021-02-20 ENCOUNTER — Other Ambulatory Visit (HOSPITAL_COMMUNITY): Payer: Self-pay

## 2021-02-20 ENCOUNTER — Encounter: Payer: Self-pay | Admitting: *Deleted

## 2021-02-24 ENCOUNTER — Other Ambulatory Visit (HOSPITAL_COMMUNITY): Payer: Self-pay

## 2021-02-24 ENCOUNTER — Other Ambulatory Visit: Payer: Self-pay

## 2021-02-24 MED ORDER — BUPROPION HCL ER (XL) 300 MG PO TB24
300.0000 mg | ORAL_TABLET | Freq: Every morning | ORAL | 0 refills | Status: DC
  Filled 2021-02-24: qty 30, 30d supply, fill #0

## 2021-03-04 ENCOUNTER — Other Ambulatory Visit (HOSPITAL_COMMUNITY): Payer: Self-pay

## 2021-03-05 ENCOUNTER — Other Ambulatory Visit: Payer: Self-pay

## 2021-03-05 ENCOUNTER — Ambulatory Visit: Payer: 59 | Admitting: Family Medicine

## 2021-03-05 ENCOUNTER — Ambulatory Visit: Payer: Self-pay | Admitting: *Deleted

## 2021-03-05 VITALS — BP 104/68 | HR 79 | Temp 98.0°F | Wt 133.4 lb

## 2021-03-05 DIAGNOSIS — M7672 Peroneal tendinitis, left leg: Secondary | ICD-10-CM | POA: Diagnosis not present

## 2021-03-05 NOTE — Patient Instructions (Signed)
Heat for 20 minutes 3 times per day.  You can actually take 4 ibuprofen 3 times per day.  Relative rest

## 2021-03-05 NOTE — Progress Notes (Signed)
   Subjective:    Patient ID: Dorothy Wood, female    DOB: 1961-03-09, 60 y.o.   MRN: [id number]  HPI She complains of a several week history of left ankle pain and swelling.  This is on the lateral aspect of her ankle.  No history of injury or overuse.   Review of Systems     Objective:   Physical Exam Full motion of the ankle.  Tender to palpation over the posterior lateral malleolus progressing all the way to the head of the fifth metatarsal.  Provocative testing did cause more difficulty.  No pain over the other metatarsals.       Assessment & Plan:   Peroneus longus tendinitis, left I explained that this is a tendinitis.  What initially use heat, anti-inflammatory and relative rest trying to elevate the heel slightly to take some of the pressure off the tendon.  If continued difficulty will refer further for possible physical therapy.

## 2021-03-06 ENCOUNTER — Other Ambulatory Visit (HOSPITAL_COMMUNITY): Payer: Self-pay

## 2021-03-06 DIAGNOSIS — F419 Anxiety disorder, unspecified: Secondary | ICD-10-CM | POA: Diagnosis not present

## 2021-03-06 DIAGNOSIS — F902 Attention-deficit hyperactivity disorder, combined type: Secondary | ICD-10-CM | POA: Diagnosis not present

## 2021-03-06 MED ORDER — BUPROPION HCL ER (XL) 300 MG PO TB24
ORAL_TABLET | ORAL | 1 refills | Status: DC
  Filled 2021-03-06: qty 90, 90d supply, fill #0

## 2021-03-06 MED ORDER — GUANFACINE HCL 2 MG PO TABS
ORAL_TABLET | ORAL | 1 refills | Status: DC
  Filled 2021-03-06: qty 180, 90d supply, fill #0
  Filled 2021-06-14: qty 180, 90d supply, fill #1

## 2021-03-06 MED ORDER — METHYLPHENIDATE HCL 20 MG PO TABS
20.0000 mg | ORAL_TABLET | Freq: Three times a day (TID) | ORAL | 0 refills | Status: DC
  Filled 2021-03-06: qty 270, 90d supply, fill #0

## 2021-03-06 MED ORDER — LORAZEPAM 1 MG PO TABS
1.0000 mg | ORAL_TABLET | Freq: Every day | ORAL | 1 refills | Status: DC | PRN
  Filled 2021-03-06: qty 15, 15d supply, fill #0
  Filled 2021-03-18: qty 15, 15d supply, fill #1

## 2021-03-06 NOTE — Progress Notes (Signed)
Office Visit Note  Patient: Dorothy Wood             Date of Birth: 03-30-1961           MRN: [id number]             PCP: Denita Lung, MD Referring: Denita Lung, MD Visit Date: 03/18/2021 Occupation: @GUAROCC @  Subjective:  Dry mouth and dry eyes   History of Present Illness: Dorothy Wood is a 60 y.o. female in consultation per request of her PCP.  According to the patient she has had dry mouth and dry eyes for many years.  Several years ago she was evaluated by Dr. Kristen Wood as her sister had some autoimmune symptoms.  Although her sister was not diagnosed with an autoimmune disease.  Kinzlie had lab work done by Dr. Kristen Wood and was diagnosed with Sjogren's.  She does not recall what serology was positive.  He recommended [place].  Patient was prescribed by her dentist for many years and then later by Dr. Redmond School.  She states her symptoms are quite well controlled with medication.  She does not use any other over-the-counter products.  She states she was recently hospitalized for partial ileus.  Which was related to previous surgery and scar tissue.  Her symptoms have completely resolved now.  She was also found to have prolonged QT interval during the hospital stay which was the effect of her medication.  She was found to have elevated LFTs in May 2022.  She states she had repeat labs done by her GYN and her LFTs has come down.  She used to consume 4 glasses of wine per week but she is cut down to 2 glasses/week now.  There is no family history of autoimmune disease.  She is gravida 2, para 2, miscarriages 0.  Activities of Daily Living:  Patient reports morning stiffness for 0  none .   Patient Denies nocturnal pain.  Difficulty dressing/grooming: Denies Difficulty climbing stairs: Denies Difficulty getting out of chair: Denies Difficulty using hands for taps, buttons, cutlery, and/or writing: Denies  Review of Systems  Constitutional:  Negative for fatigue.  HENT:   Negative for mouth dryness.   Eyes:  Negative for dryness.  Respiratory:  Negative for shortness of breath.   Cardiovascular:  Negative for swelling in legs/feet.  Gastrointestinal:  Negative for constipation.  Endocrine: Negative for excessive thirst.  Genitourinary:  Negative for difficulty urinating.  Musculoskeletal:  Negative for joint pain and joint pain.  Skin:  Negative for color change, rash and sensitivity to sunlight.  Allergic/Immunologic: Negative for susceptible to infections.  Neurological:  Negative for weakness.  Hematological:  Negative for bruising/bleeding tendency and swollen glands.  Psychiatric/Behavioral:  Positive for depressed mood. Negative for sleep disturbance. The patient is not nervous/anxious.    PMFS History:  Patient Active Problem List   Diagnosis Date Noted   Gastritis 02/04/2021   Intractable nausea and vomiting 02/02/2021   Prolonged QT interval 02/02/2021   High anion gap metabolic acidosis 40/06/2724   OSA (obstructive sleep apnea) 09/10/2020   TIA (transient ischemic attack) 04/29/2020   Essential hypertension 04/29/2020   Hormone replacement therapy (HRT) 09/09/2016   Hypothyroidism 07/16/2014   Sjogren's syndrome ([place]) 04/11/2013   History of ovarian cancer 04/11/2013   Personal history of colonic polyps 04/11/2013   ADD (attention deficit disorder) 07/06/2012   Dysthymia 07/06/2012    Past Medical History:  Diagnosis Date   Cancer ([place])  OVARIAN   Colonic polyp    Gastritis    Hyperlipidemia    Hypertension    Sjogren's disease ([place])    Thyroid disease    HYPOTHYROIDISM    Family History  Problem Relation Age of Onset   Mental illness Mother    Hypertension Mother    Heart failure Mother    CAD Father        CABG at age 65   Mental illness Sister    Alcoholism Sister    Mental illness Brother    Alcoholism Brother    Mental illness Maternal Uncle    Mental illness Maternal Grandmother    Cancer Paternal Grandmother     Past Surgical History:  Procedure Laterality Date   ABDOMINAL HYSTERECTOMY     Ovarian cancer   BIOPSY  02/04/2021   Procedure: BIOPSY;  Surgeon: Dorothy Lobo, MD;  Location: [facility] ENDOSCOPY;  Service: Endoscopy;;   BREAST CYST ASPIRATION     CESAREAN SECTION     x2   COLONOSCOPY  10-06   Dr. Earnest Bailey   ESOPHAGOGASTRODUODENOSCOPY N/A 02/04/2021   Procedure: ESOPHAGOGASTRODUODENOSCOPY (EGD);  Surgeon: Dorothy Lobo, MD;  Location: [facility];  Service: Endoscopy;  Laterality: N/A;   TONSILLECTOMY     Social History   Social History Narrative   Not on file   Immunization History  Administered Date(s) Administered   Influenza,inj,Quad PF,6+ Mos 05/09/2014, 06/02/2019   Influenza-Unspecified 06/14/2014, 05/16/2015, 05/15/2018, 06/20/2018, 05/25/2020   PFIZER(Purple Top)SARS-COV-2 Vaccination 09/09/2019, 09/29/2019, 05/07/2020   Tdap 05/09/2014     Objective: Vital Signs: BP 121/64 (BP Location: Right Arm, Patient Position: Sitting, Cuff Size: Normal)   Pulse 83   Resp 14   Ht 5\' 5"  (1.651 m)   Wt 132 lb (59.9 kg)   BMI 21.97 kg/m    Physical Exam Vitals and nursing note reviewed.  Constitutional:      Appearance: She is well-developed.  HENT:     Head: Normocephalic and atraumatic.  Eyes:     Conjunctiva/sclera: Conjunctivae normal.     Comments: She wears contacts.  No conjunctival injection was noted.  Cardiovascular:     Rate and Rhythm: Normal rate and regular rhythm.     Heart sounds: Normal heart sounds.  Pulmonary:     Effort: Pulmonary effort is normal.     Breath sounds: Normal breath sounds.  Abdominal:     General: Bowel sounds are normal.     Palpations: Abdomen is soft.  Musculoskeletal:     Cervical back: Normal range of motion.  Lymphadenopathy:     Cervical: No cervical adenopathy.  Skin:    General: Skin is warm and dry.     Capillary Refill: Capillary refill takes less than 2 seconds.  Neurological:     Mental Status: She is alert and  oriented to person, place, and time.  Psychiatric:        Behavior: Behavior normal.     Musculoskeletal Exam: She had good range of motion of her cervical spine.  There was no tenderness over thoracic or lumbar region.  Shoulder joints, elbow joints, wrist joints, MCPs PIPs and DIPs with good range of motion.  She had bilateral PIP and DIP prominence consistent with osteoarthritis.  Hip joints and knee joints with good range of motion.  She has some prominence of DIP and PIP in her feet consistent with osteoarthritis.  No synovitis was noted on the examination.  CDAI Exam: CDAI Score: -- Patient Global: --; Provider Global: --  Swollen: --; Tender: -- Joint Exam 03/18/2021   No joint exam has been documented for this visit   There is currently no information documented on the homunculus. Go to the Rheumatology activity and complete the homunculus joint exam.  Investigation: No additional findings.  Imaging: No results found.  Recent Labs: Lab Results  Component Value Date   WBC 7.7 02/04/2021   HGB 11.3 (L) 02/04/2021   PLT 200 02/04/2021   NA 140 02/05/2021   K 4.1 02/05/2021   CL 110 02/05/2021   CO2 27 02/05/2021   GLUCOSE 102 (H) 02/05/2021   BUN 11 02/05/2021   CREATININE 0.65 02/05/2021   BILITOT 1.1 02/02/2021   ALKPHOS 106 02/02/2021   AST 50 (H) 02/02/2021   ALT 82 (H) 02/02/2021   PROT 8.1 02/02/2021   ALBUMIN 5.0 02/02/2021   CALCIUM 8.4 (L) 02/05/2021   GFRAA 93 07/22/2020    Speciality Comments: No specialty comments available.  Procedures:  No procedures performed Allergies: Patient has no known allergies.   Assessment / Plan:     Visit Diagnoses: Sicca complex ([place]) -patient states she was diagnosed with Sjogren's syndrome by Dr. Kristen Wood many years ago.  She was placed on pilocarpine.  Patient was prescribed by her ophthalmologist for many years and then by Dr. Redmond School.  She states her symptoms are very well controlled with pilocarpine.  She is not  using any over-the-counter products.  I will obtain following labs today.  Plan: Urinalysis, Routine w reflex microscopic, Rheumatoid factor, ANA, RNP Antibody, Anti-Smith antibody, Sjogrens syndrome-A extractable nuclear antibody, Sjogrens syndrome-B extractable nuclear antibody, Anti-DNA antibody, double-stranded, C3 and C4, Anti-scleroderma antibody, Beta-2 glycoprotein antibodies, Cardiolipin antibodies, IgG, IgM, IgA, Lupus Anticoagulant Eval w/Reflex, Sedimentation rate  Other fatigue -she relates fatigue to working night shift.  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  She started working from home due to her husband's illness.  Plan: CK, Serum protein electrophoresis with reflex  DDD (degenerative disc disease), cervical-she gives history of C-spine discectomy x2 by Dr. Ellene Route.  Primary osteoarthritis of both hands-DIP and PIP prominence was noted.  No synovitis was noted.  Joint protection was discussed.  Prolonged QT interval-patient states she was diagnosed with prolonged QT interval due to the medication while she was in the hospital.  She also had an EKG and did not have any arrhythmias.  Elevated LFTs-patient states that elevated LFTs could be related to drinking wine.  She has cut back on wine consumption.  She states her LFTs are improving which were done by her GYN recently.  I do not have the results available.  TIA (transient ischemic attack) - 2020 related to hyperlipidemia  Other medical problems are listed as follows:  Essential hypertension  Dyslipidemia  OSA (obstructive sleep apnea)  History of hypothyroidism  Personal history of colonic polyps  Attention deficit hyperactivity disorder (ADHD), unspecified ADHD type  History of ovarian cancer - at age 42, ttd with [facility] BSO.  Orders: Orders Placed This Encounter  Procedures   Urinalysis, Routine w reflex microscopic   CK   Rheumatoid factor   ANA   RNP Antibody   Anti-Smith antibody   Sjogrens syndrome-A  extractable nuclear antibody   Sjogrens syndrome-B extractable nuclear antibody   Anti-DNA antibody, double-stranded   C3 and C4   Anti-scleroderma antibody   Beta-2 glycoprotein antibodies   Cardiolipin antibodies, IgG, IgM, IgA   Lupus Anticoagulant Eval w/Reflex   Serum protein electrophoresis with reflex   Sedimentation  rate    No orders of the defined types were placed in this encounter.    Follow-Up Instructions: Return for History of Sjogren's.   Bo Merino, MD  Note - This record has been created using Editor, commissioning.  Chart creation errors have been sought, but may not always  have been located. Such creation errors do not reflect on  the standard of medical care.

## 2021-03-07 ENCOUNTER — Other Ambulatory Visit (HOSPITAL_COMMUNITY): Payer: Self-pay

## 2021-03-10 ENCOUNTER — Other Ambulatory Visit (HOSPITAL_COMMUNITY): Payer: Self-pay

## 2021-03-11 ENCOUNTER — Other Ambulatory Visit (HOSPITAL_COMMUNITY): Payer: Self-pay

## 2021-03-13 DIAGNOSIS — Z0000 Encounter for general adult medical examination without abnormal findings: Secondary | ICD-10-CM | POA: Diagnosis not present

## 2021-03-13 DIAGNOSIS — Z01419 Encounter for gynecological examination (general) (routine) without abnormal findings: Secondary | ICD-10-CM | POA: Diagnosis not present

## 2021-03-13 DIAGNOSIS — Z6822 Body mass index (BMI) 22.0-22.9, adult: Secondary | ICD-10-CM | POA: Diagnosis not present

## 2021-03-14 LAB — HEMOGLOBIN A1C: Hemoglobin A1C: 5.7

## 2021-03-18 ENCOUNTER — Encounter: Payer: Self-pay | Admitting: Rheumatology

## 2021-03-18 ENCOUNTER — Other Ambulatory Visit (HOSPITAL_COMMUNITY): Payer: Self-pay

## 2021-03-18 ENCOUNTER — Other Ambulatory Visit: Payer: Self-pay

## 2021-03-18 ENCOUNTER — Ambulatory Visit: Payer: 59 | Admitting: Rheumatology

## 2021-03-18 VITALS — BP 121/64 | HR 83 | Resp 14 | Ht 65.0 in | Wt 132.0 lb

## 2021-03-18 DIAGNOSIS — R5383 Other fatigue: Secondary | ICD-10-CM

## 2021-03-18 DIAGNOSIS — M3509 Sicca syndrome with other organ involvement: Secondary | ICD-10-CM | POA: Diagnosis not present

## 2021-03-18 DIAGNOSIS — Z86010 Personal history of colonic polyps: Secondary | ICD-10-CM

## 2021-03-18 DIAGNOSIS — Z8543 Personal history of malignant neoplasm of ovary: Secondary | ICD-10-CM

## 2021-03-18 DIAGNOSIS — R9431 Abnormal electrocardiogram [ECG] [EKG]: Secondary | ICD-10-CM

## 2021-03-18 DIAGNOSIS — M3500 Sicca syndrome, unspecified: Secondary | ICD-10-CM

## 2021-03-18 DIAGNOSIS — M19042 Primary osteoarthritis, left hand: Secondary | ICD-10-CM

## 2021-03-18 DIAGNOSIS — M19041 Primary osteoarthritis, right hand: Secondary | ICD-10-CM

## 2021-03-18 DIAGNOSIS — E785 Hyperlipidemia, unspecified: Secondary | ICD-10-CM | POA: Diagnosis not present

## 2021-03-18 DIAGNOSIS — G4733 Obstructive sleep apnea (adult) (pediatric): Secondary | ICD-10-CM

## 2021-03-18 DIAGNOSIS — I10 Essential (primary) hypertension: Secondary | ICD-10-CM

## 2021-03-18 DIAGNOSIS — M5030 Other cervical disc degeneration, unspecified cervical region: Secondary | ICD-10-CM

## 2021-03-18 DIAGNOSIS — G459 Transient cerebral ischemic attack, unspecified: Secondary | ICD-10-CM | POA: Diagnosis not present

## 2021-03-18 DIAGNOSIS — R7989 Other specified abnormal findings of blood chemistry: Secondary | ICD-10-CM

## 2021-03-18 DIAGNOSIS — Z8639 Personal history of other endocrine, nutritional and metabolic disease: Secondary | ICD-10-CM

## 2021-03-18 DIAGNOSIS — Z79890 Hormone replacement therapy (postmenopausal): Secondary | ICD-10-CM

## 2021-03-18 DIAGNOSIS — F909 Attention-deficit hyperactivity disorder, unspecified type: Secondary | ICD-10-CM

## 2021-03-19 ENCOUNTER — Other Ambulatory Visit (HOSPITAL_COMMUNITY): Payer: Self-pay

## 2021-03-24 ENCOUNTER — Other Ambulatory Visit (HOSPITAL_COMMUNITY): Payer: Self-pay

## 2021-03-24 LAB — URINALYSIS, ROUTINE W REFLEX MICROSCOPIC
Bilirubin Urine: NEGATIVE
Glucose, UA: NEGATIVE
Hgb urine dipstick: NEGATIVE
Ketones, ur: NEGATIVE
Leukocytes,Ua: NEGATIVE
Nitrite: NEGATIVE
Protein, ur: NEGATIVE
Specific Gravity, Urine: 1.012 (ref 1.001–1.035)
pH: 5.5 (ref 5.0–8.0)

## 2021-03-24 LAB — SJOGRENS SYNDROME-A EXTRACTABLE NUCLEAR ANTIBODY: SSA (Ro) (ENA) Antibody, IgG: <1 AI

## 2021-03-24 LAB — PROTEIN ELECTROPHORESIS, SERUM, WITH REFLEX
Albumin ELP: 4.4 g/dL (ref 3.8–4.8)
Alpha 1: 0.3 g/dL (ref 0.2–0.3)
Alpha 2: 0.8 g/dL (ref 0.5–0.9)
Beta 2: 0.3 g/dL (ref 0.2–0.5)
Beta Globulin: 0.4 g/dL (ref 0.4–0.6)
Gamma Globulin: 0.6 g/dL — ABNORMAL LOW (ref 0.8–1.7)
Total Protein: 6.7 g/dL (ref 6.1–8.1)

## 2021-03-24 LAB — CK: Total CK: 212 U/L — ABNORMAL HIGH (ref 29–143)

## 2021-03-24 LAB — LUPUS ANTICOAGULANT EVAL W/ REFLEX
PTT-LA Screen: 37 s (ref <=40)
dRVVT: 34 s (ref <=45)

## 2021-03-24 LAB — ANTI-DNA ANTIBODY, DOUBLE-STRANDED: ds DNA Ab: <1 IU/mL

## 2021-03-24 LAB — RHEUMATOID FACTOR: Rheumatoid fact SerPl-aCnc: <14 IU/mL (ref <14)

## 2021-03-24 LAB — CARDIOLIPIN ANTIBODIES, IGG, IGM, IGA
Anticardiolipin IgA: <2 APL-U/mL
Anticardiolipin IgG: <2 GPL-U/mL
Anticardiolipin IgM: <2 MPL-U/mL

## 2021-03-24 LAB — C3 AND C4
C3 Complement: 161 mg/dL (ref 83–193)
C4 Complement: 34 mg/dL (ref 15–57)

## 2021-03-24 LAB — BETA-2 GLYCOPROTEIN ANTIBODIES
Beta-2 Glyco 1 IgA: <2 U/mL
Beta-2 Glyco 1 IgM: <2 U/mL
Beta-2 Glyco I IgG: <2 U/mL

## 2021-03-24 LAB — SJOGRENS SYNDROME-B EXTRACTABLE NUCLEAR ANTIBODY: SSB (La) (ENA) Antibody, IgG: 3 AI — AB

## 2021-03-24 LAB — IFE INTERPRETATION: Immunofix Electr Int: NOT DETECTED

## 2021-03-24 LAB — RNP ANTIBODY: Ribonucleic Protein(ENA) Antibody, IgG: <1 AI

## 2021-03-24 LAB — SEDIMENTATION RATE: Sed Rate: 2 mm/h (ref 0–30)

## 2021-03-24 LAB — ANA: Anti Nuclear Antibody (ANA): NEGATIVE

## 2021-03-24 LAB — ANTI-SMITH ANTIBODY: ENA SM Ab Ser-aCnc: <1 AI

## 2021-03-24 LAB — ANTI-SCLERODERMA ANTIBODY: Scleroderma (Scl-70) (ENA) Antibody, IgG: <1 AI

## 2021-03-24 NOTE — Progress Notes (Signed)
CK is mildly elevated, I will repeat CK and aldolase at the follow-up visit. SSB Antibody is positive.  I will discuss the results at the follow-up visit.

## 2021-03-25 ENCOUNTER — Other Ambulatory Visit (HOSPITAL_BASED_OUTPATIENT_CLINIC_OR_DEPARTMENT_OTHER): Payer: Self-pay

## 2021-03-25 ENCOUNTER — Other Ambulatory Visit: Payer: Self-pay

## 2021-03-25 ENCOUNTER — Ambulatory Visit: Payer: 59 | Attending: Internal Medicine

## 2021-03-25 DIAGNOSIS — Z23 Encounter for immunization: Secondary | ICD-10-CM

## 2021-03-25 MED ORDER — PFIZER-BIONT COVID-19 VAC-TRIS 30 MCG/0.3ML IM SUSP
INTRAMUSCULAR | 0 refills | Status: DC
  Filled 2021-03-25: qty 0.3, 1d supply, fill #0

## 2021-03-25 NOTE — Progress Notes (Signed)
   Covid-19 Vaccination Clinic  Name:  Dorothy Wood    MRN: [id number] DOB: Jul 29, 1961  03/25/2021  [name] was observed post Covid-19 immunization for 15 minutes without incident. She was provided with Vaccine Information Sheet and instruction to access the V-Safe system.   [name] was instructed to call [phone] with any severe reactions post vaccine: Difficulty breathing  Swelling of face and throat  A fast heartbeat  A bad rash all over body  Dizziness and weakness   Immunizations Administered     Name Date Dose VIS Date Route   PFIZER Comrnaty(Gray TOP) Covid-19 Vaccine 03/25/2021  2:56 PM 0.3 mL 08/22/2020 Intramuscular   Manufacturer: [place]   Lot: [id number]   [place]: [phone]

## 2021-03-26 ENCOUNTER — Encounter: Payer: Self-pay | Admitting: Family Medicine

## 2021-03-28 ENCOUNTER — Other Ambulatory Visit (HOSPITAL_COMMUNITY): Payer: Self-pay

## 2021-03-28 ENCOUNTER — Other Ambulatory Visit: Payer: Self-pay | Admitting: Family Medicine

## 2021-03-28 MED ORDER — ATORVASTATIN CALCIUM 40 MG PO TABS
ORAL_TABLET | Freq: Every day | ORAL | 0 refills | Status: DC
  Filled 2021-03-28: qty 90, 90d supply, fill #0

## 2021-04-06 ENCOUNTER — Observation Stay (HOSPITAL_COMMUNITY)
Admission: EM | Admit: 2021-04-06 | Discharge: 2021-04-07 | Disposition: A | Discharge status: Home / Self Care | Payer: 59 | Attending: Internal Medicine | Admitting: Internal Medicine

## 2021-04-06 ENCOUNTER — Emergency Department (HOSPITAL_COMMUNITY): Payer: 59

## 2021-04-06 ENCOUNTER — Other Ambulatory Visit: Payer: Self-pay

## 2021-04-06 ENCOUNTER — Encounter (HOSPITAL_COMMUNITY): Payer: Self-pay | Admitting: Internal Medicine

## 2021-04-06 ENCOUNTER — Observation Stay (HOSPITAL_COMMUNITY): Payer: 59

## 2021-04-06 DIAGNOSIS — Z8543 Personal history of malignant neoplasm of ovary: Secondary | ICD-10-CM | POA: Diagnosis not present

## 2021-04-06 DIAGNOSIS — R42 Dizziness and giddiness: Secondary | ICD-10-CM | POA: Diagnosis not present

## 2021-04-06 DIAGNOSIS — I10 Essential (primary) hypertension: Secondary | ICD-10-CM | POA: Diagnosis not present

## 2021-04-06 DIAGNOSIS — Z7982 Long term (current) use of aspirin: Secondary | ICD-10-CM | POA: Insufficient documentation

## 2021-04-06 DIAGNOSIS — M3500 Sicca syndrome, unspecified: Secondary | ICD-10-CM | POA: Diagnosis present

## 2021-04-06 DIAGNOSIS — Z20822 Contact with and (suspected) exposure to covid-19: Secondary | ICD-10-CM | POA: Insufficient documentation

## 2021-04-06 DIAGNOSIS — Z79899 Other long term (current) drug therapy: Secondary | ICD-10-CM | POA: Insufficient documentation

## 2021-04-06 DIAGNOSIS — G459 Transient cerebral ischemic attack, unspecified: Secondary | ICD-10-CM | POA: Diagnosis not present

## 2021-04-06 DIAGNOSIS — R29898 Other symptoms and signs involving the musculoskeletal system: Secondary | ICD-10-CM | POA: Insufficient documentation

## 2021-04-06 DIAGNOSIS — F909 Attention-deficit hyperactivity disorder, unspecified type: Secondary | ICD-10-CM

## 2021-04-06 DIAGNOSIS — R29818 Other symptoms and signs involving the nervous system: Secondary | ICD-10-CM | POA: Diagnosis not present

## 2021-04-06 DIAGNOSIS — E039 Hypothyroidism, unspecified: Secondary | ICD-10-CM | POA: Insufficient documentation

## 2021-04-06 DIAGNOSIS — R200 Anesthesia of skin: Secondary | ICD-10-CM | POA: Diagnosis not present

## 2021-04-06 DIAGNOSIS — G4733 Obstructive sleep apnea (adult) (pediatric): Secondary | ICD-10-CM | POA: Diagnosis present

## 2021-04-06 DIAGNOSIS — R531 Weakness: Secondary | ICD-10-CM | POA: Diagnosis not present

## 2021-04-06 DIAGNOSIS — F988 Other specified behavioral and emotional disorders with onset usually occurring in childhood and adolescence: Secondary | ICD-10-CM | POA: Diagnosis present

## 2021-04-06 DIAGNOSIS — R202 Paresthesia of skin: Secondary | ICD-10-CM | POA: Insufficient documentation

## 2021-04-06 HISTORY — DX: Transient cerebral ischemic attack, unspecified: G45.9

## 2021-04-06 LAB — PROTIME-INR
INR: 0.9 (ref 0.8–1.2)
Prothrombin Time: 12.4 seconds (ref 11.4–15.2)

## 2021-04-06 LAB — DIFFERENTIAL
Abs Immature Granulocytes: 0.02 10*3/uL (ref 0.00–0.07)
Basophils Absolute: 0 10*3/uL (ref 0.0–0.1)
Basophils Relative: 1 %
Eosinophils Absolute: 0.1 10*3/uL (ref 0.0–0.5)
Eosinophils Relative: 1 %
Immature Granulocytes: 0 %
Lymphocytes Relative: 34 %
Lymphs Abs: 2.9 10*3/uL (ref 0.7–4.0)
Monocytes Absolute: 0.7 10*3/uL (ref 0.1–1.0)
Monocytes Relative: 8 %
Neutro Abs: 4.9 10*3/uL (ref 1.7–7.7)
Neutrophils Relative %: 56 %

## 2021-04-06 LAB — COMPREHENSIVE METABOLIC PANEL
ALT: 45 U/L — ABNORMAL HIGH (ref 0–44)
AST: 31 U/L (ref 15–41)
Albumin: 4.4 g/dL (ref 3.5–5.0)
Alkaline Phosphatase: 93 U/L (ref 38–126)
Anion gap: 11 (ref 5–15)
BUN: 17 mg/dL (ref 6–20)
CO2: 24 mmol/L (ref 22–32)
Calcium: 9.8 mg/dL (ref 8.9–10.3)
Chloride: 99 mmol/L (ref 98–111)
Creatinine, Ser: 0.81 mg/dL (ref 0.44–1.00)
GFR, Estimated: >60 mL/min (ref >60)
Glucose, Bld: 88 mg/dL (ref 70–99)
Potassium: 3.6 mmol/L (ref 3.5–5.1)
Sodium: 134 mmol/L — ABNORMAL LOW (ref 135–145)
Total Bilirubin: 0.7 mg/dL (ref 0.3–1.2)
Total Protein: 7.1 g/dL (ref 6.5–8.1)

## 2021-04-06 LAB — I-STAT CHEM 8, ED
BUN: 18 mg/dL (ref 6–20)
Calcium, Ion: 1.1 mmol/L — ABNORMAL LOW (ref 1.15–1.40)
Chloride: 100 mmol/L (ref 98–111)
Creatinine, Ser: 0.7 mg/dL (ref 0.44–1.00)
Glucose, Bld: 87 mg/dL (ref 70–99)
HCT: 41 % (ref 36.0–46.0)
Hemoglobin: 13.9 g/dL (ref 12.0–15.0)
Potassium: 3.5 mmol/L (ref 3.5–5.1)
Sodium: 135 mmol/L (ref 135–145)
TCO2: 24 mmol/L (ref 22–32)

## 2021-04-06 LAB — CBC
HCT: 39.7 % (ref 36.0–46.0)
Hemoglobin: 13.4 g/dL (ref 12.0–15.0)
MCH: 30.9 pg (ref 26.0–34.0)
MCHC: 33.8 g/dL (ref 30.0–36.0)
MCV: 91.7 fL (ref 80.0–100.0)
Platelets: 276 10*3/uL (ref 150–400)
RBC: 4.33 MIL/uL (ref 3.87–5.11)
RDW: 11.9 % (ref 11.5–15.5)
WBC: 8.7 10*3/uL (ref 4.0–10.5)
nRBC: 0 % (ref 0.0–0.2)

## 2021-04-06 LAB — I-STAT BETA HCG BLOOD, ED (MC, WL, AP ONLY): I-stat hCG, quantitative: <5 m[IU]/mL (ref <5)

## 2021-04-06 LAB — RESP PANEL BY RT-PCR (FLU A&B, COVID) ARPGX2
Influenza A by PCR: NEGATIVE
Influenza B by PCR: NEGATIVE
SARS Coronavirus 2 by RT PCR: NEGATIVE

## 2021-04-06 LAB — CBG MONITORING, ED: Glucose-Capillary: 94 mg/dL (ref 70–99)

## 2021-04-06 LAB — APTT: aPTT: 27 seconds (ref 24–36)

## 2021-04-06 LAB — LDL CHOLESTEROL, DIRECT: Direct LDL: 122.2 mg/dL — ABNORMAL HIGH (ref 0–99)

## 2021-04-06 IMAGING — DX DG CHEST 1V PORT
1 series · 1 of 1 positions shown · non-contrast
Comparison: [DATE]

CLINICAL DATA: Dizziness with right-sided numbness and weakness.

EXAM:
PORTABLE CHEST 1 VIEW

[chest]
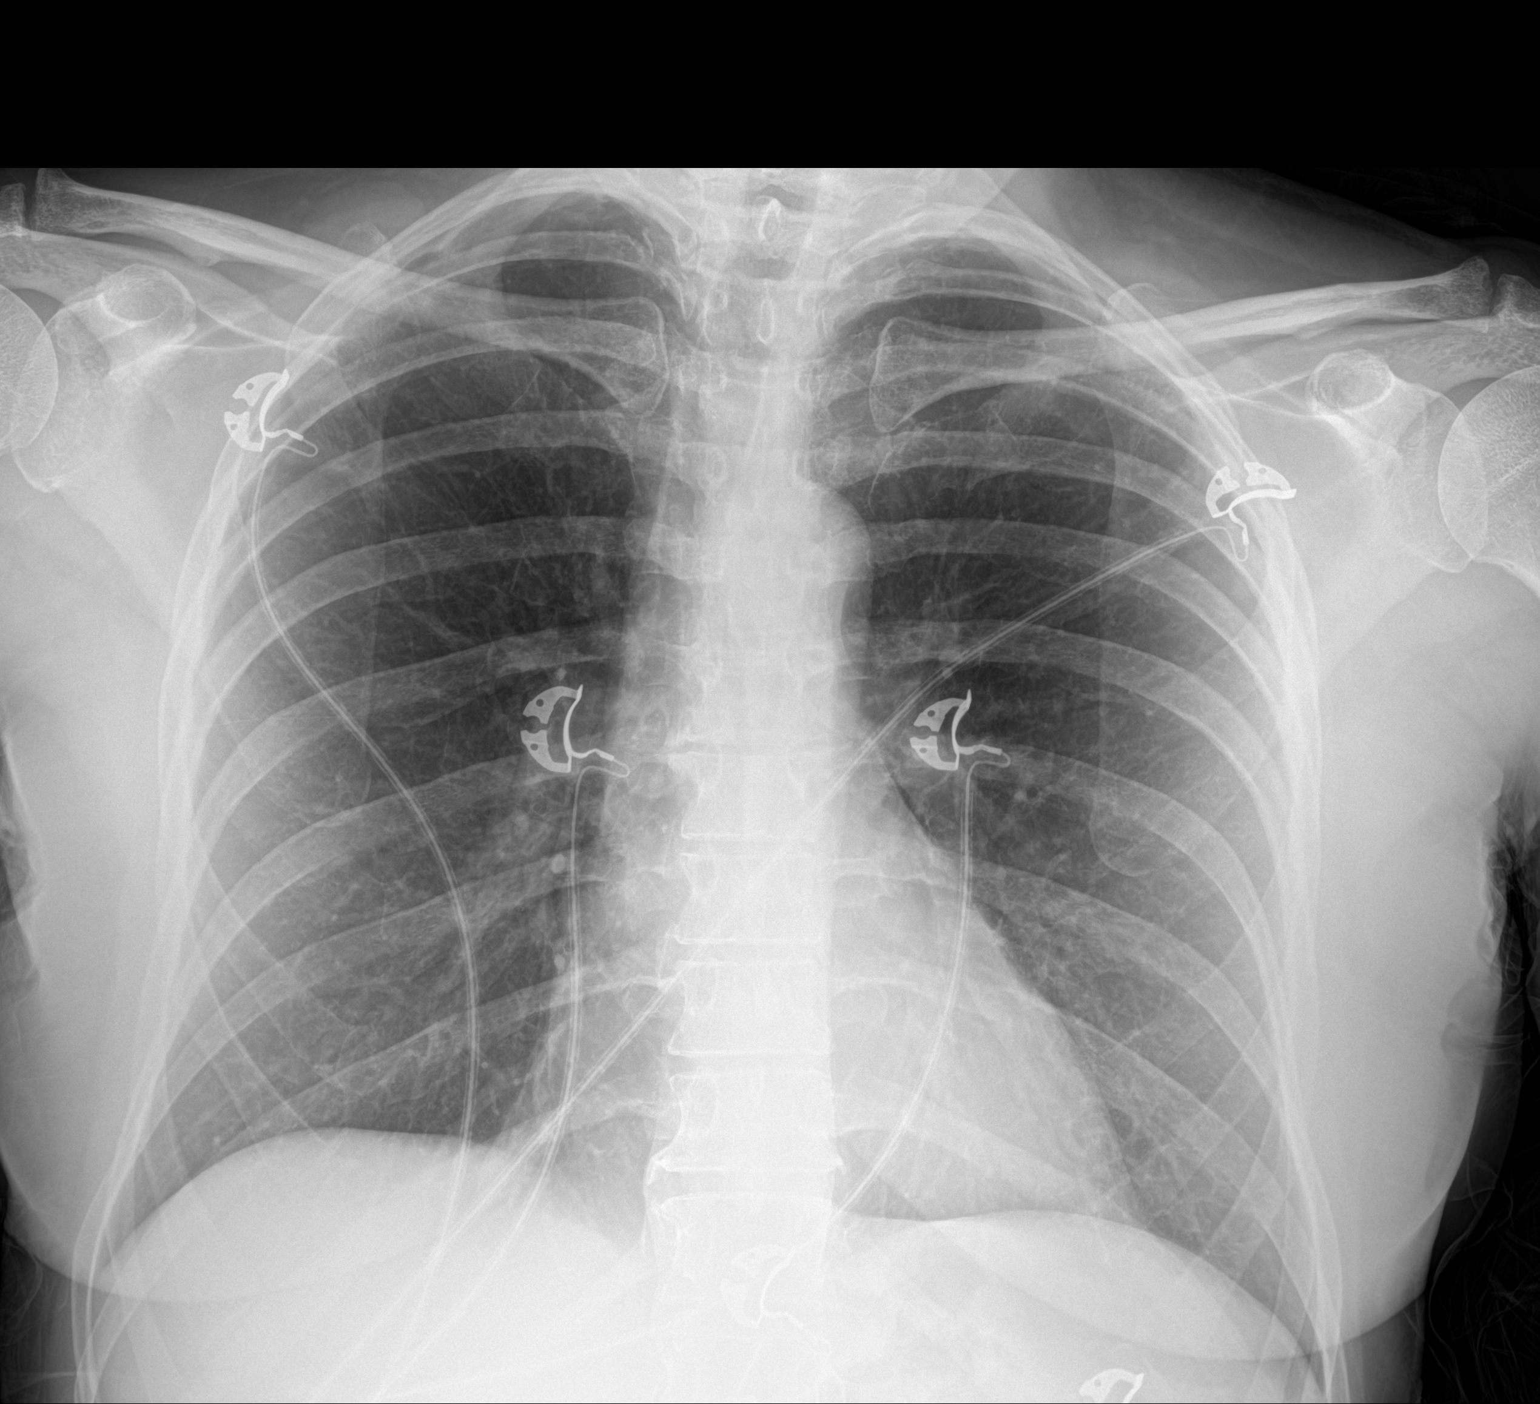

[1 of 1 positions shown; findings below may reference images not displayed]

FINDINGS: The heart size and mediastinal contours are within normal limits.
Both lungs are clear. The visualized skeletal structures are
unremarkable.
IMPRESSION: No active cardiopulmonary disease.

## 2021-04-06 IMAGING — CT CT HEAD CODE STROKE
4 series · 16 of 47 positions shown, 18 images · non-contrast
Comparison: Head CT [DATE] and MRI [DATE]

CLINICAL DATA: Code stroke. Neuro deficit, acute, stroke suspected.
Sudden onset of dizziness and right-sided numbness and weakness

EXAM:
CT HEAD WITHOUT CONTRAST
TECHNIQUE: Contiguous axial images were obtained from the base of the skull
through the vertex without intravenous contrast.

[Series 3: head wo · axial · 0.43mm/px · z∈[-166,-46]mm · 7 of 32 slices shown, 9 images]
[im 4/32  brain]
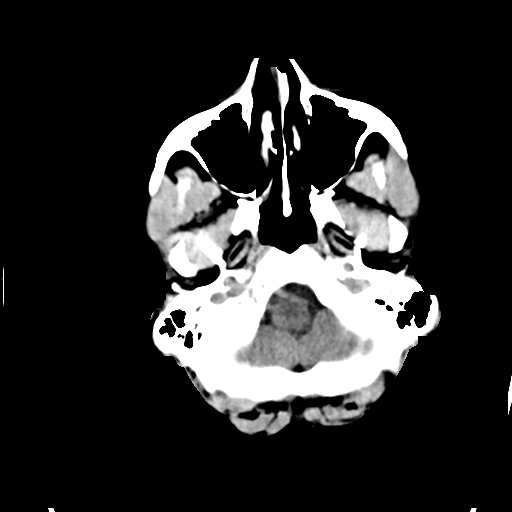
[im 4/32  bone]
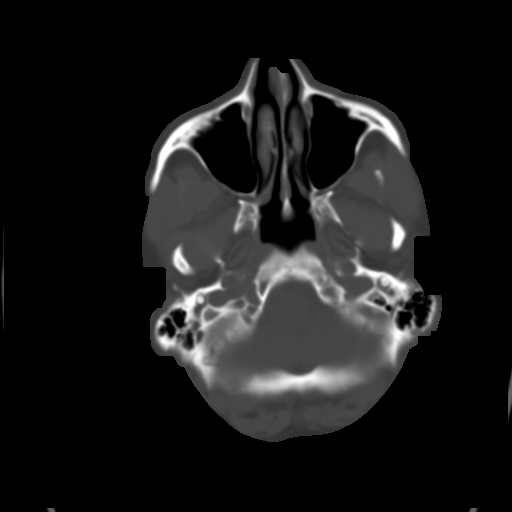
[im 8/32  brain]
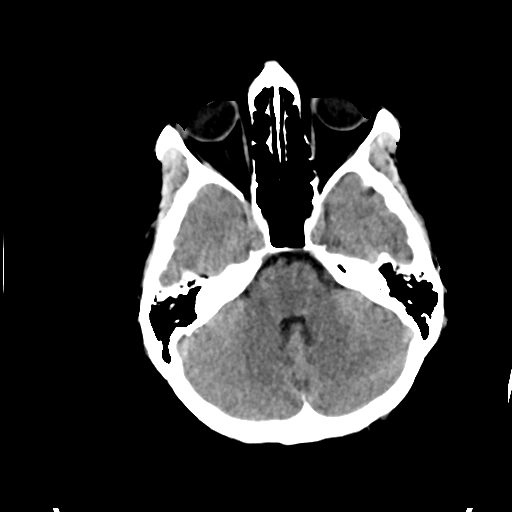
[im 12/32  brain]
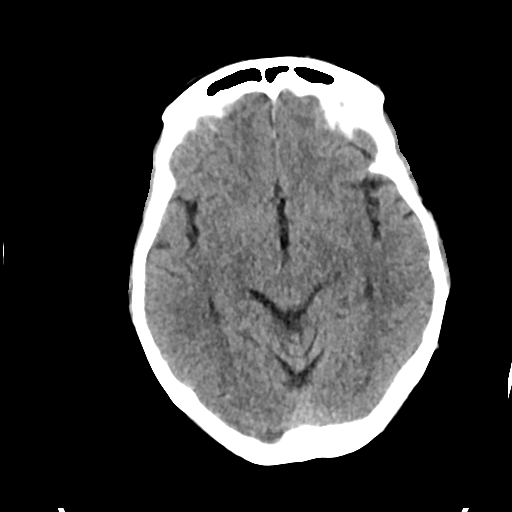
[im 16/32  brain]
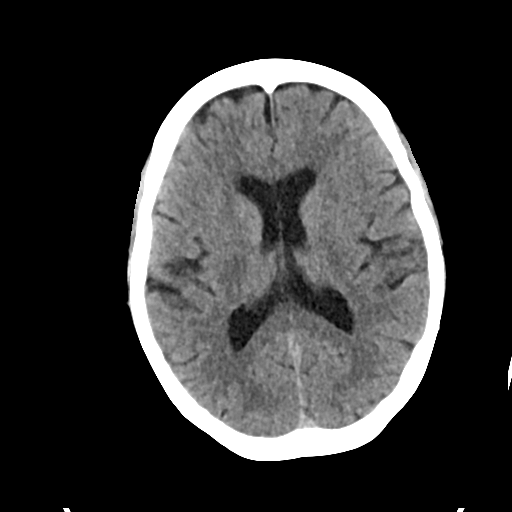
[im 20/32  brain]
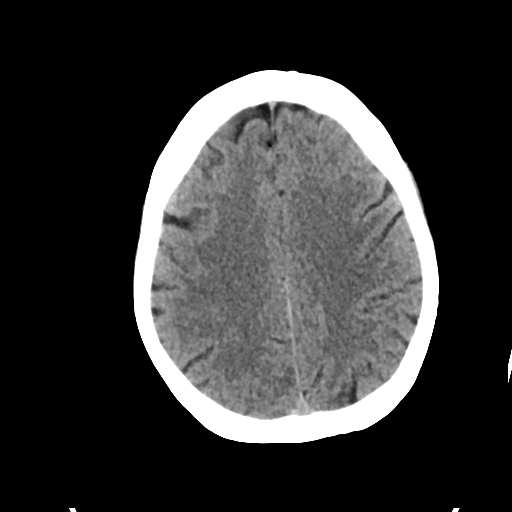
[im 20/32  bone]
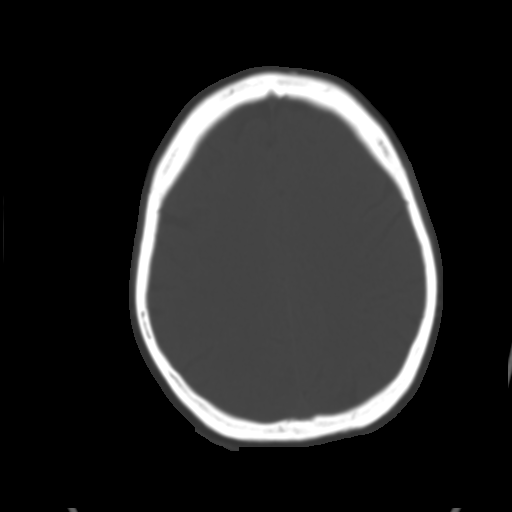
[im 24/32  brain]
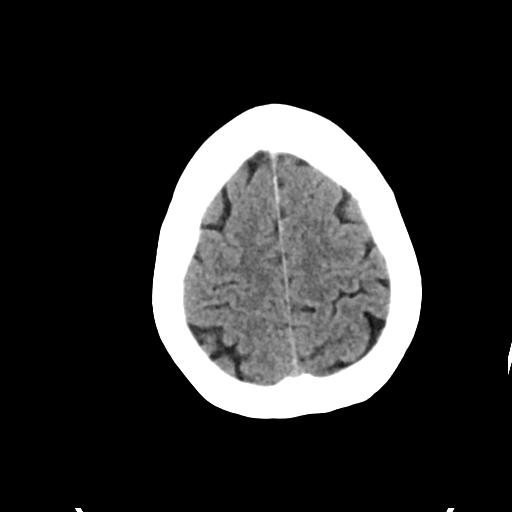
[im 28/32  brain]
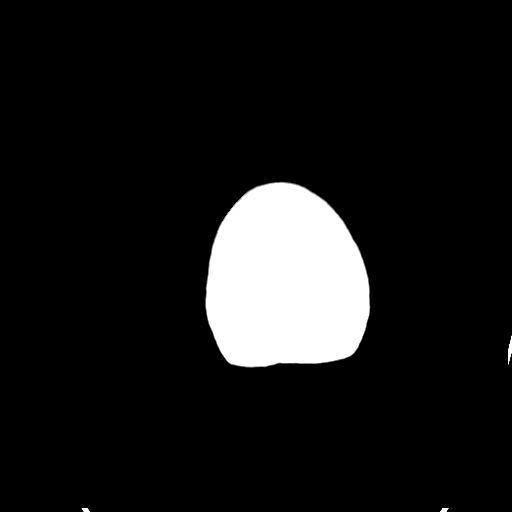

[Series 4: head bone · axial · 0.43mm/px · z∈[-167,-135]mm · 3 of 78 slices shown]
[im 8/78  bone]
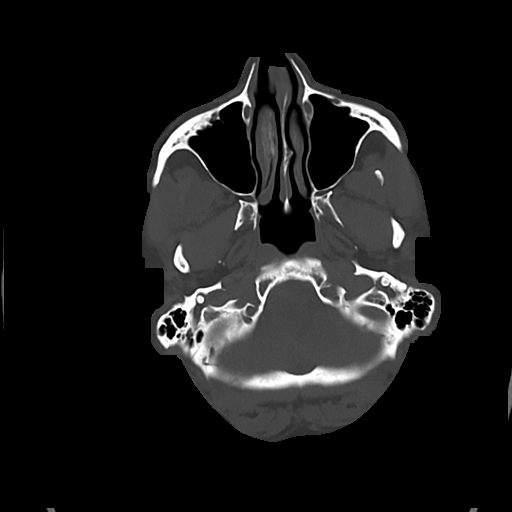
[im 16/78  bone]
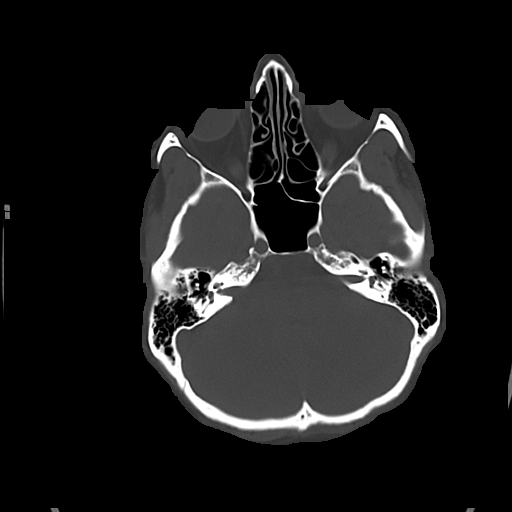
[im 24/78  bone]
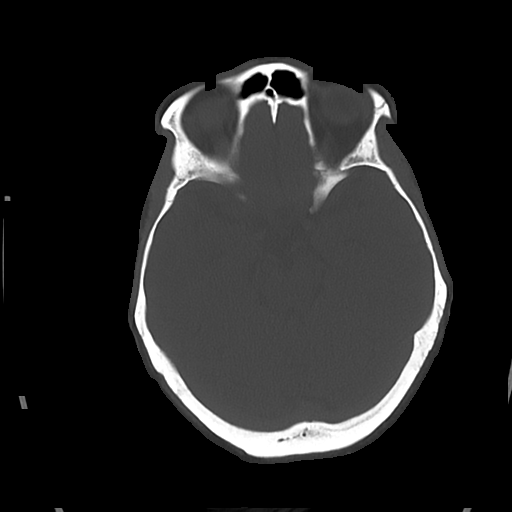

[Series 5: cor soft · coronal · 0.29mm/px · 3 of 66 slices shown]
[im 23/66  brain]
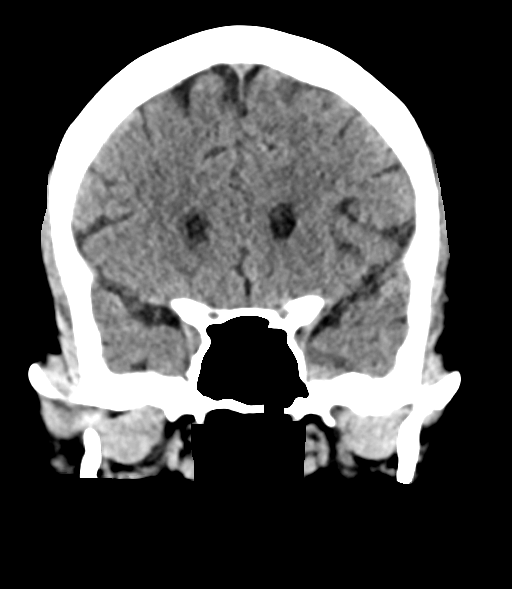
[im 30/66  brain]
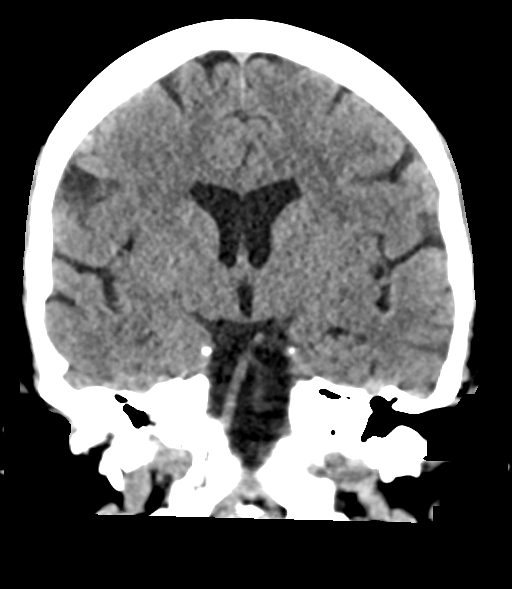
[im 36/66  brain]
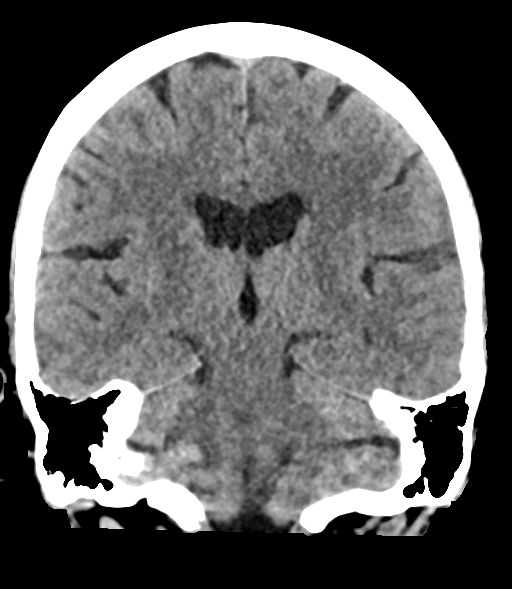

[Series 6: sag soft · sagittal · 0.33mm/px · 3 of 56 slices shown]
[im 19/56  brain]
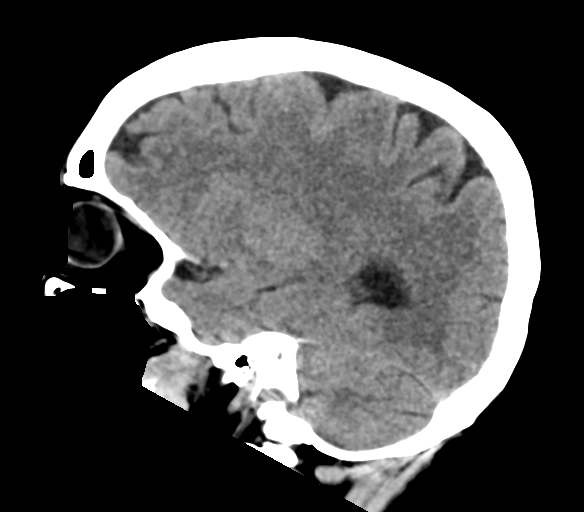
[im 28/56  brain]
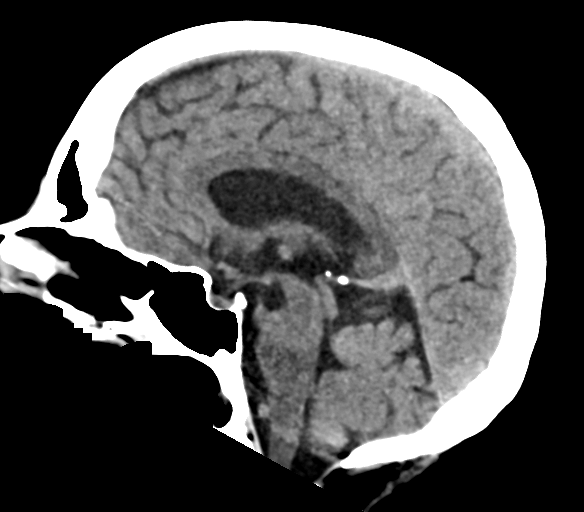
[im 37/56  brain]
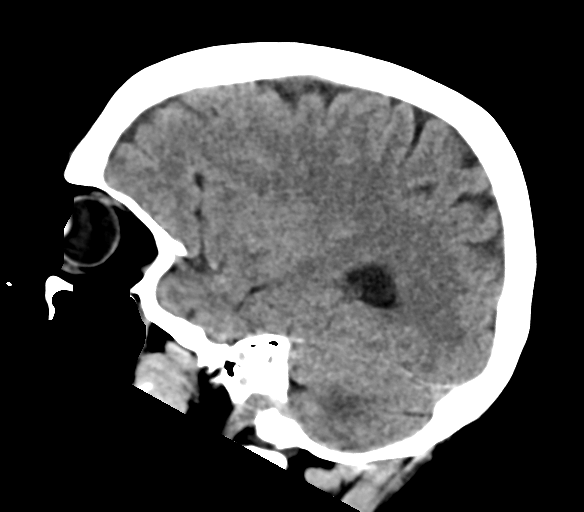

[16 of 47 positions shown; findings below may reference images not displayed]

FINDINGS: Brain: There is no evidence of an acute infarct, intracranial
hemorrhage, mass, midline shift, or extra-axial fluid collection.
The ventricles and sulci are normal.

Vascular: No hyperdense vessel.

Skull: No fracture or suspicious osseous lesion.

Sinuses/Orbits: The visualized paranasal sinuses and mastoid air
cells are clear. Unremarkable orbits.

Other: None.

ASPECTS (Alberta Stroke Program Early CT Score)

- Ganglionic level infarction (caudate, lentiform nuclei, internal
capsule, insula, M1-M3 cortex): 7

- Supraganglionic infarction (M4-M6 cortex): 3

Total score (0-10 with 10 being normal): 10
IMPRESSION: 1. Negative head CT.
2. ASPECTS is 10.

These r

Esults were communicated to [REDACTED] at [DATE] on [DATE] by
text page via the [employer] messaging system.

## 2021-04-06 IMAGING — MR MR MRA HEAD W/O CM
1 series · 19 of 48 positions shown · IV contrast (gadavist)
Comparison: No pertinent prior exam.

CLINICAL DATA: Neuro deficit, acute, stroke suspected. Right upper
extremity numbness

EXAM:
MRI HEAD WITHOUT CONTRAST
MRA HEAD WITHOUT CONTRAST
MRA OF THE NECK WITHOUT AND WITH CONTRAST
TECHNIQUE: Multiplanar, multi-echo pulse sequences of the brain and surrounding
structures were acquired without intravenous contrast. Angiographic
images of the Circle of Willis were acquired using MRA technique
without intravenous contrast. Angiographic images of the neck were
acquired using MRA technique without and with intravenous contrast.
Carotid stenosis measurements (when applicable) are obtained
utilizing NASCET criteria, using the distal internal carotid
diameter as the denominator.
CONTRAST:  6mL GADAVIST GADOBUTROL 1 MMOL/ML IV SOLN

[Series 5: 3d cow · axial · 0.5mm · 0.41mm/px · z∈[-113,-32]mm · 19 of 172 slices shown]
[im 1/172]
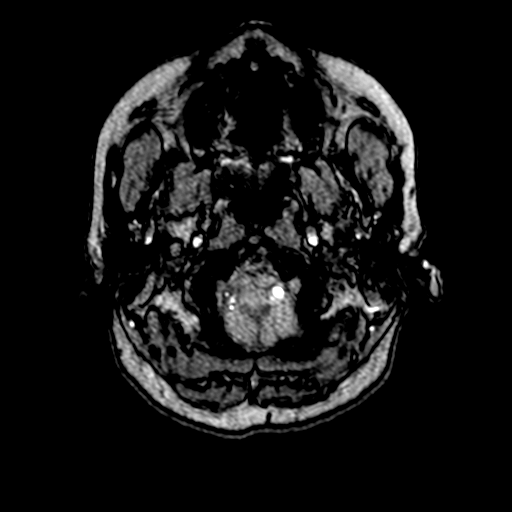
[im 4/172]
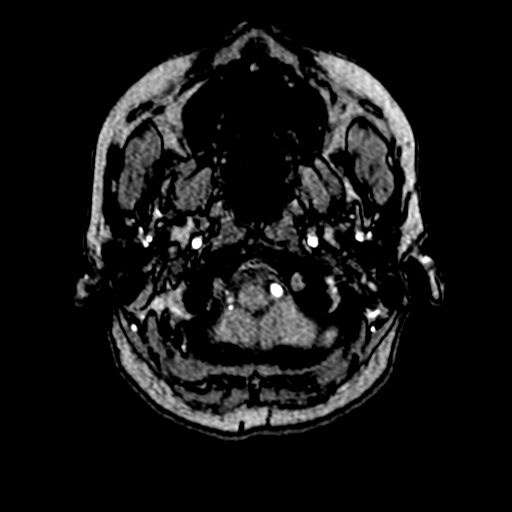
[im 8/172]
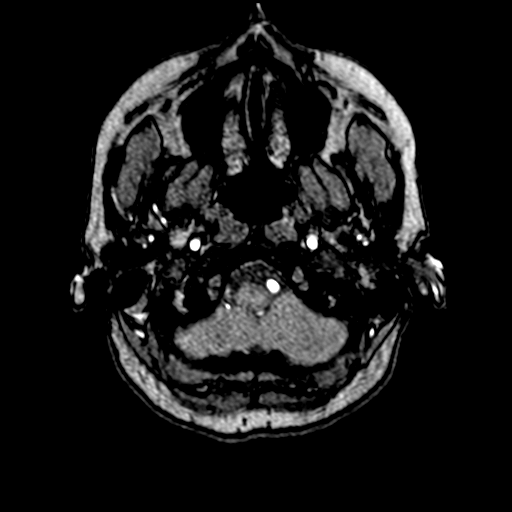
[im 11/172]
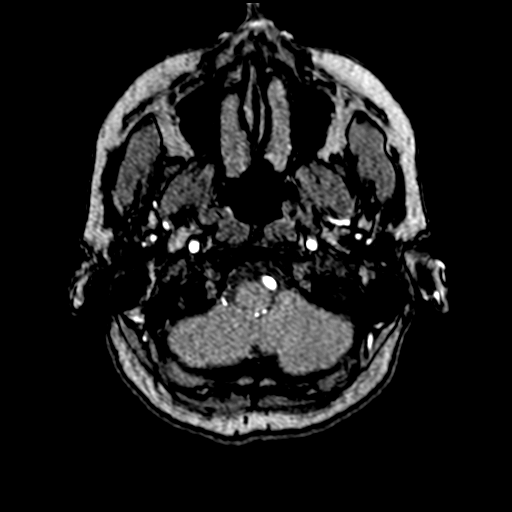
[im 15/172]
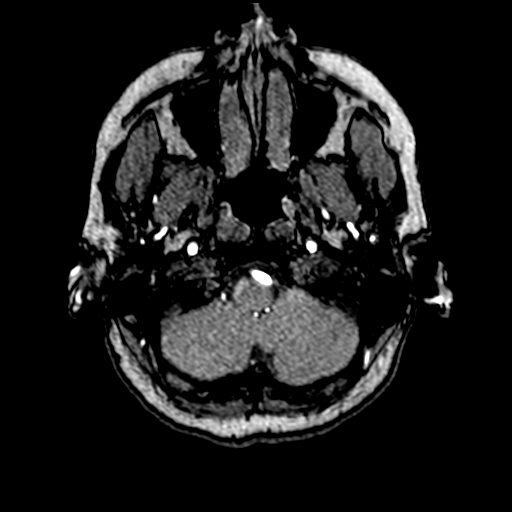
[im 19/172]
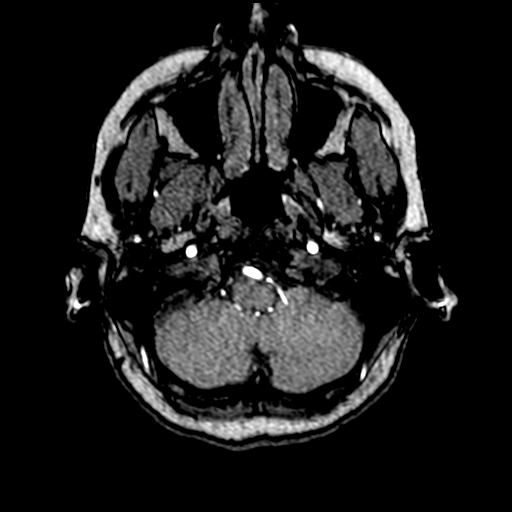
[im 22/172]
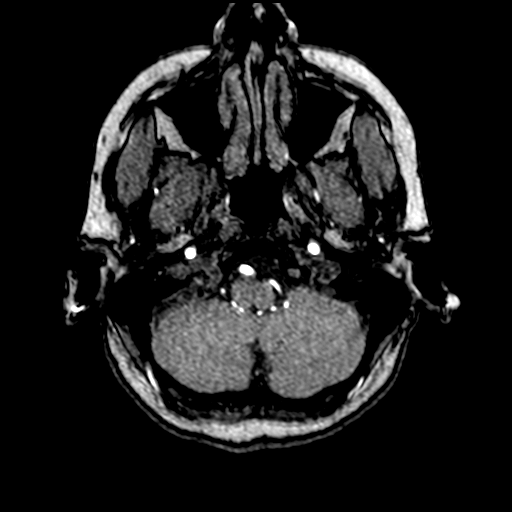
[im 26/172]
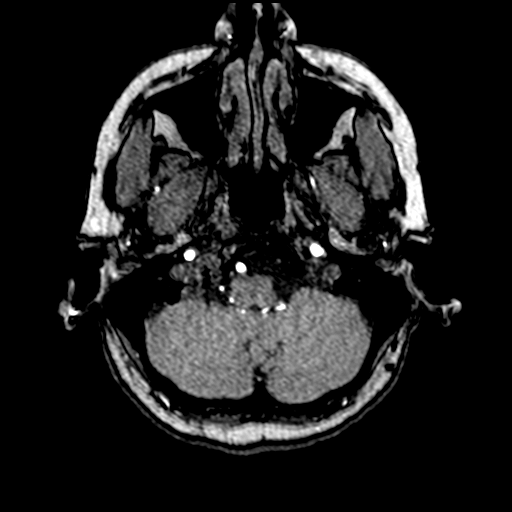
[im 30/172]
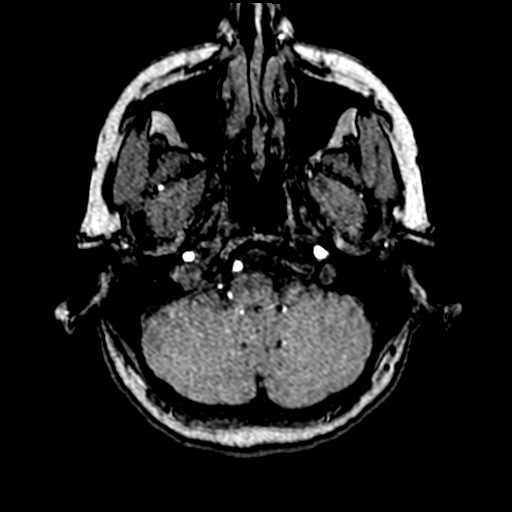
[im 33/172]
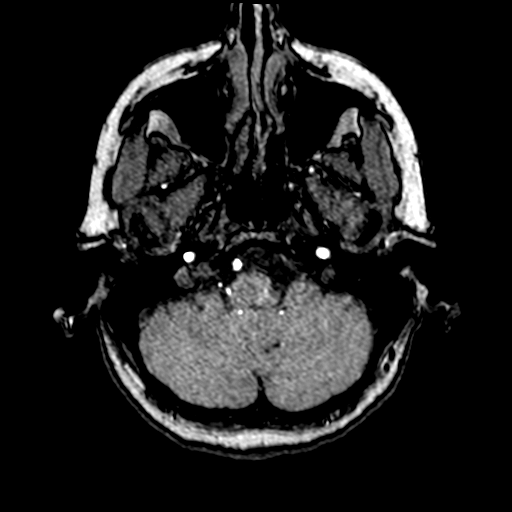
[im 37/172]
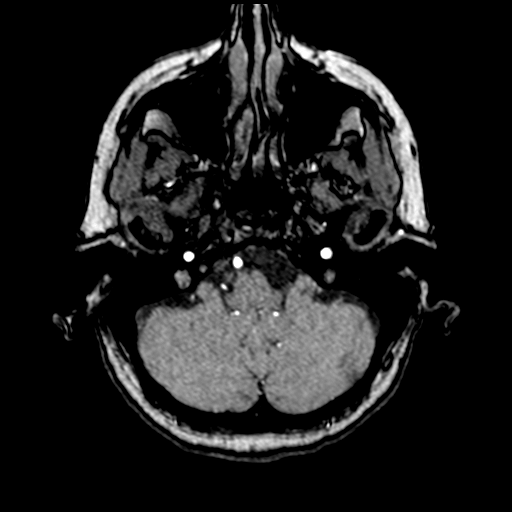
[im 55/172]
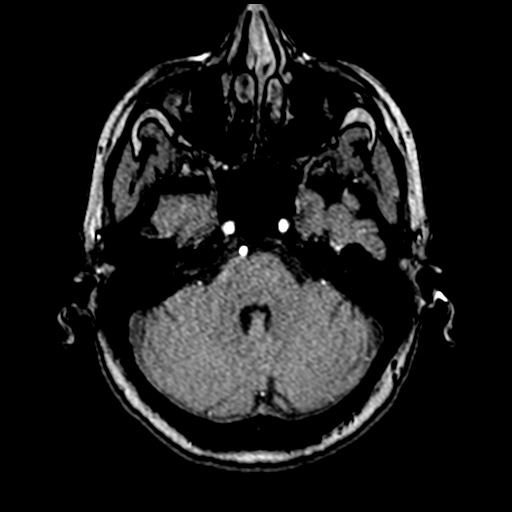
[im 77/172]
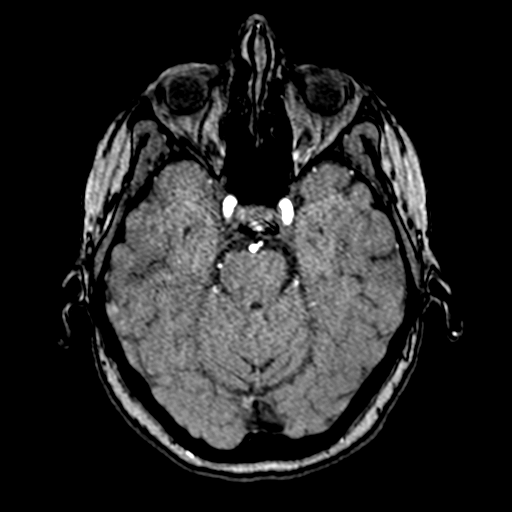
[im 88/172]
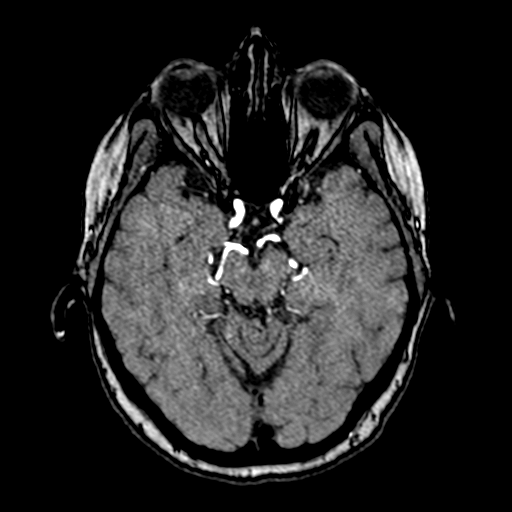
[im 99/172]
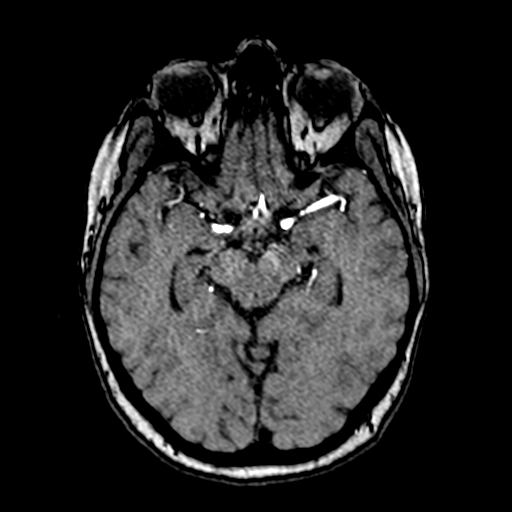
[im 121/172]
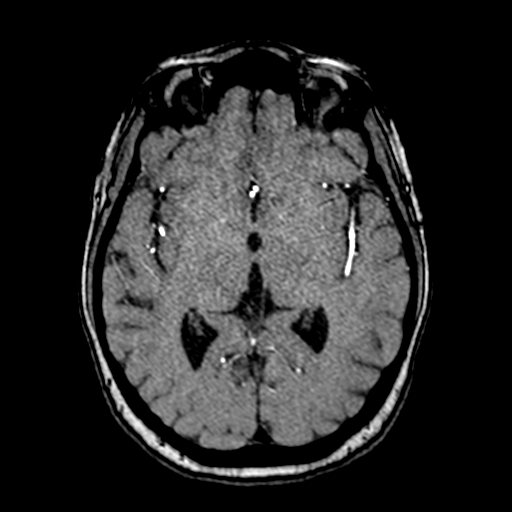
[im 142/172]
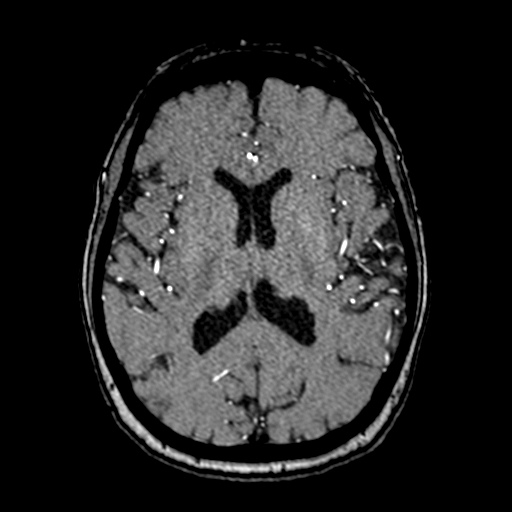
[im 146/172]
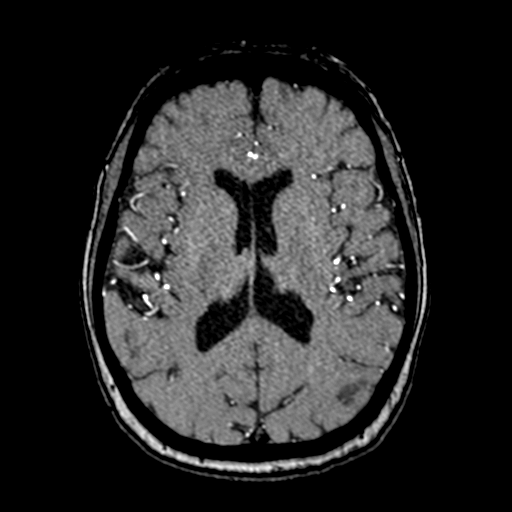
[im 164/172]
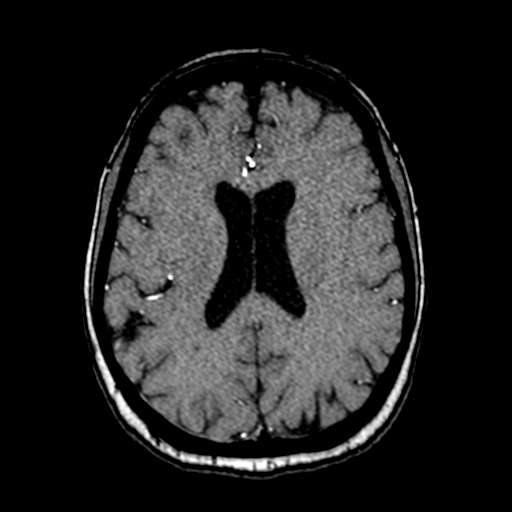

[19 of 48 positions shown; findings below may reference images not displayed]

FINDINGS: MR HEAD FINDINGS

Brain: No acute infarct, mass effect or extra-axial collection. No
acute or chronic hemorrhage. Normal white matter signal, parenchymal
volume and CSF spaces. The midline structures are normal.

Vascular: Major flow voids are preserved.

Skull and upper cervical spine: Normal calvarium and skull base.
Visualized upper cervical spine and soft tissues are normal.

Sinuses/Orbits:No paranasal sinus fluid levels or advanced mucosal
thickening. No mastoid or middle ear effusion. Normal orbits.

MRA HEAD FINDINGS

POSTERIOR CIRCULATION:

--Vertebral arteries: Normal

--Inferior cerebellar arteries: Normal.

--Basilar artery: Normal.

--Superior cerebellar arteries: Normal.

--Posterior cerebral arteries: Normal.

ANTERIOR CIRCULATION:

--Intracranial internal carotid arteries: Normal.

--Anterior cerebral arteries (ACA): Normal.

--Middle cerebral arteries (MCA): Normal.

ANATOMIC VARIANTS: None

MRA NECK FINDINGS

Aortic arch: Normal

Right carotid system: Normal

Left carotid system: Normal

Vertebral arteries: Left dominant.  No occlusion.

Other: None.
IMPRESSION: 1. Normal MRI of the brain.
2. Normal MRA of the head and neck.

## 2021-04-06 IMAGING — MR MR HEAD W/O CM
12 of 13 series · 44 of 48 positions shown · IV contrast (gadavist)
Comparison: No pertinent prior exam.

CLINICAL DATA: Neuro deficit, acute, stroke suspected. Right upper
extremity numbness

EXAM:
MRI HEAD WITHOUT CONTRAST
MRA HEAD WITHOUT CONTRAST
MRA OF THE NECK WITHOUT AND WITH CONTRAST
TECHNIQUE: Multiplanar, multi-echo pulse sequences of the brain and surrounding
structures were acquired without intravenous contrast. Angiographic
images of the Circle of Willis were acquired using MRA technique
without intravenous contrast. Angiographic images of the neck were
acquired using MRA technique without and with intravenous contrast.
Carotid stenosis measurements (when applicable) are obtained
utilizing NASCET criteria, using the distal internal carotid
diameter as the denominator.
CONTRAST:  6mL GADAVIST GADOBUTROL 1 MMOL/ML IV SOLN

[Series 5: DWI · axial · 3.0mm · 0.88mm/px · z∈[-117,+29]mm · 8 of 100 slices shown (1 of 4)]
[im 1/100]
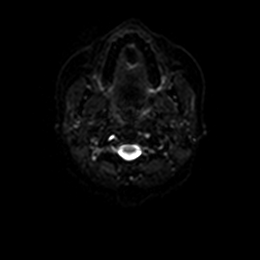
[im 15/100]
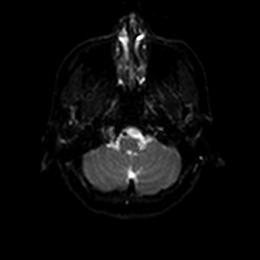
[im 29/100]
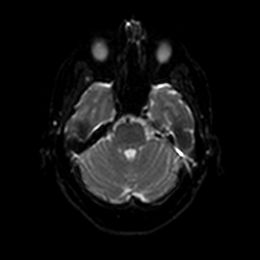
[im 43/100]
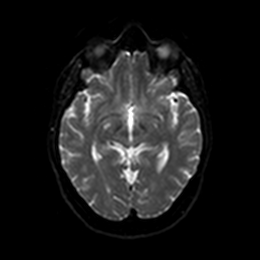
[im 57/100]
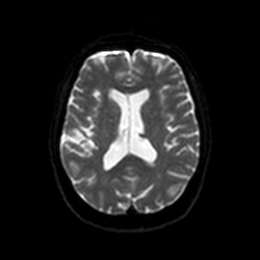
[im 71/100]
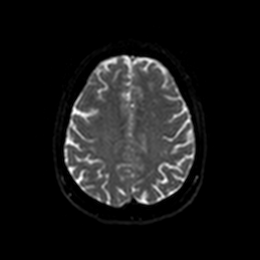
[im 85/100]
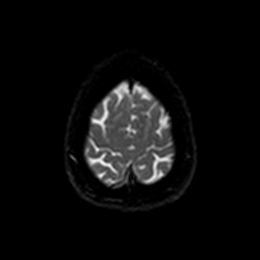
[im 100/100]
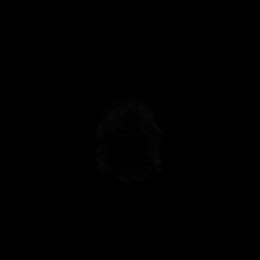

[Series 6: DWI · axial · 3.0mm · 0.88mm/px · z∈[-117,+29]mm · 4 of 50 slices shown (2 of 4)]
[im 1/50]
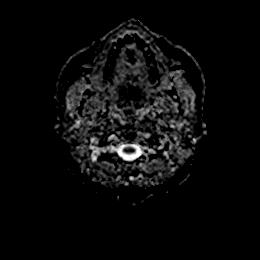
[im 17/50]
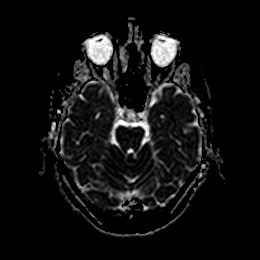
[im 33/50]
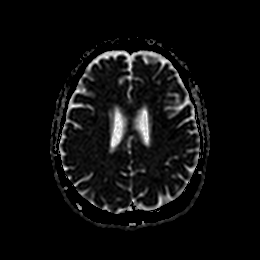
[im 50/50]
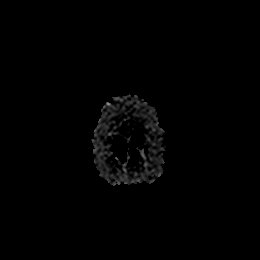

[Series 7: DWI · coronal · 4.0mm · 0.88mm/px · 5 of 68 slices shown (3 of 4)]
[im 1/68]
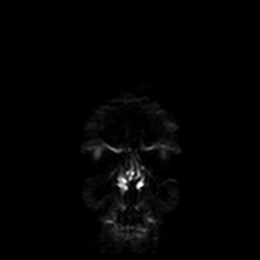
[im 17/68]
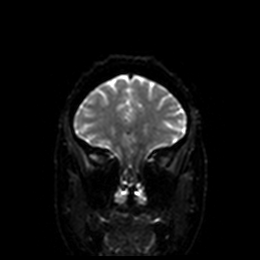
[im 34/68]
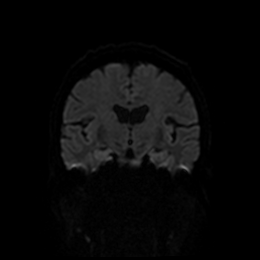
[im 51/68]
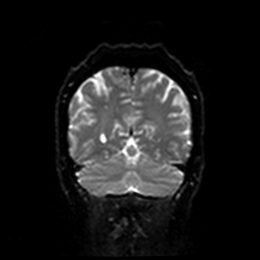
[im 68/68]
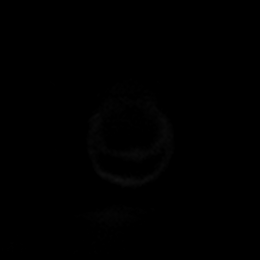

[Series 8: DWI · coronal · 4.0mm · 0.88mm/px · 3 of 34 slices shown (4 of 4)]
[im 1/34]
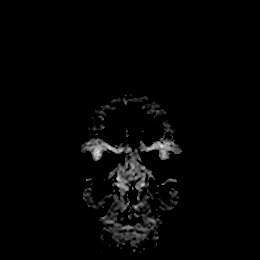
[im 17/34]
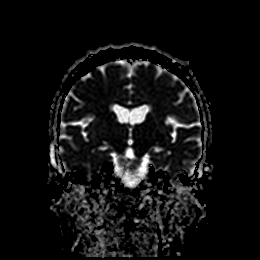
[im 34/34]
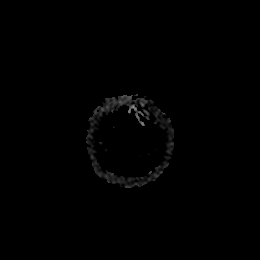

[Series 9: T1 · sagittal · 5.0mm · 0.75mm/px · 2 of 23 slices shown]
[im 1/23]
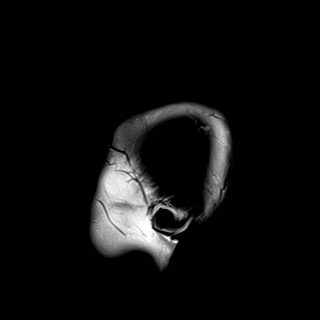
[im 23/23]
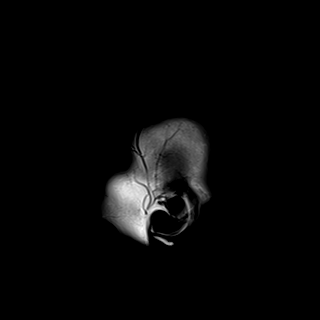

[Series 10: T2 · axial · 5.0mm · 0.72mm/px · z∈[-116,+28]mm · 2 of 25 slices shown (1 of 2)]
[im 1/25]
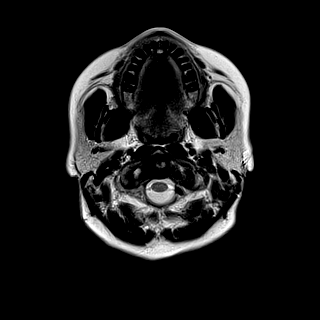
[im 25/25]
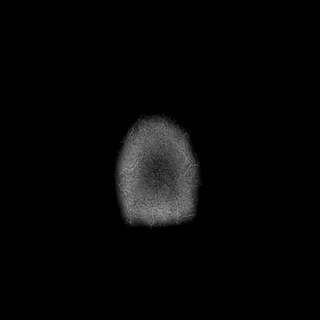

[Series 11: FLAIR · axial · 5.0mm · 0.45mm/px · z∈[-114,+29]mm · 2 of 25 slices shown]
[im 1/25]
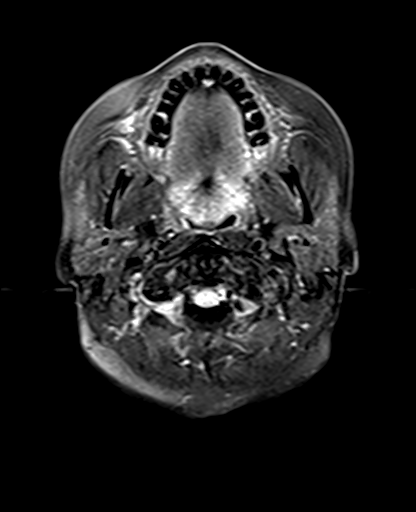
[im 25/25]
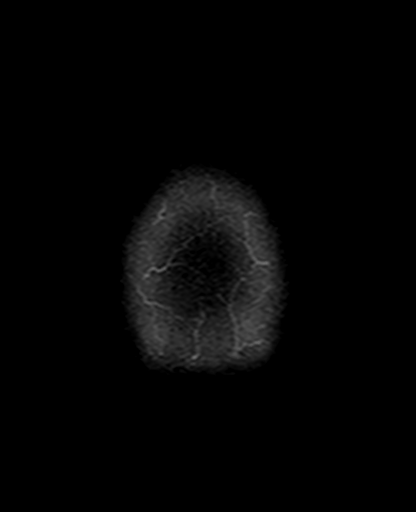

[Series 12: mag_images · axial · 3.0mm · 0.90mm/px · z∈[-119,+34]mm · 4 of 52 slices shown]
[im 1/52]
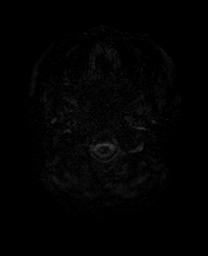
[im 18/52]
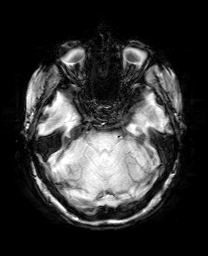
[im 35/52]
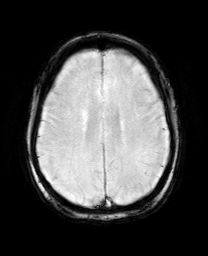
[im 52/52]
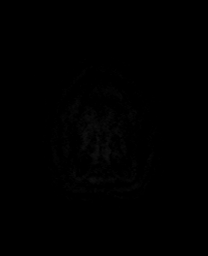

[Series 13: pha_images · axial · 3.0mm · 0.90mm/px · z∈[-119,+34]mm · 4 of 52 slices shown]
[im 1/52]
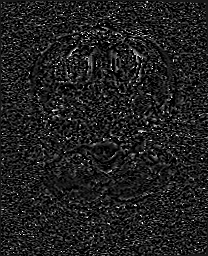
[im 18/52]
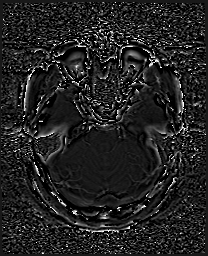
[im 35/52]
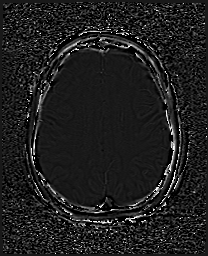
[im 52/52]
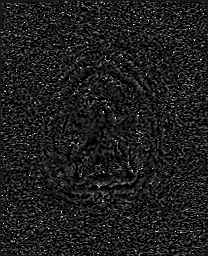

[Series 14: swi_images · axial · 3.0mm · 0.90mm/px · z∈[-119,+34]mm · 4 of 52 slices shown]
[im 1/52]
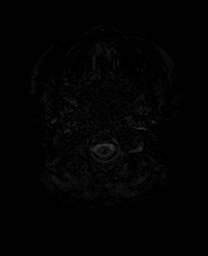
[im 18/52]
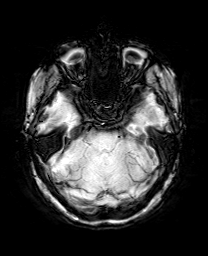
[im 35/52]
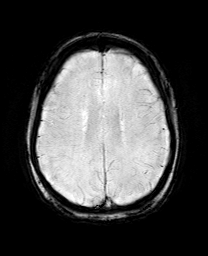
[im 52/52]
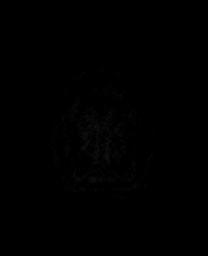

[Series 15: mip_images(sw) · axial · 24.0mm · 0.90mm/px · z∈[-108,+23]mm · 4 of 45 slices shown]
[im 1/45]
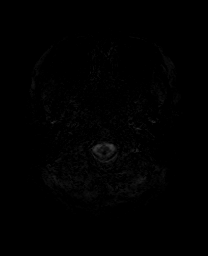
[im 15/45]
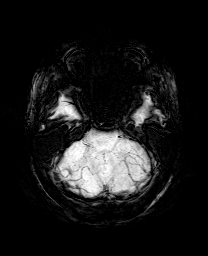
[im 30/45]
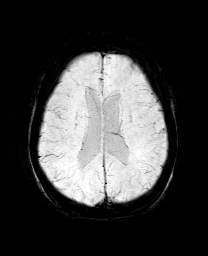
[im 45/45]
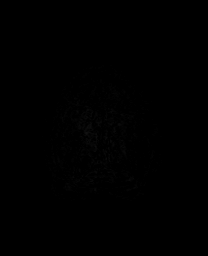

[Series 17: T2 · coronal · 5.0mm · 0.34mm/px · 2 of 29 slices shown (2 of 2)]
[im 1/29]
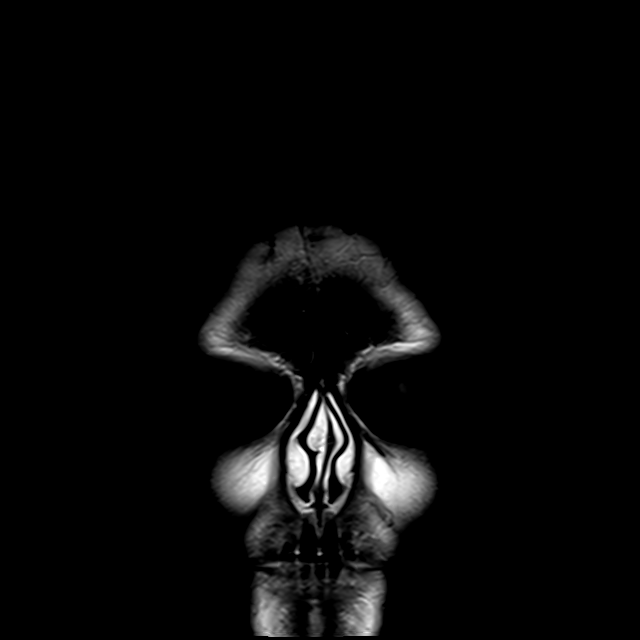
[im 29/29]
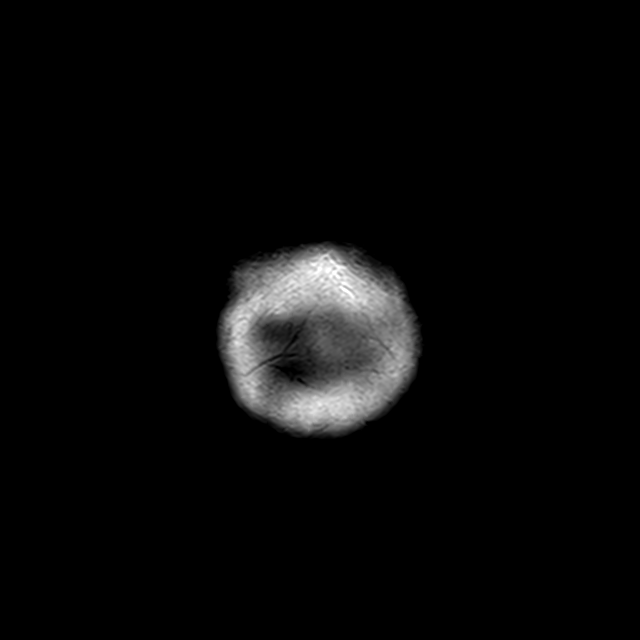

[44 of 48 positions shown; findings below may reference images not displayed]

FINDINGS: MR HEAD FINDINGS

Brain: No acute infarct, mass effect or extra-axial collection. No
acute or chronic hemorrhage. Normal white matter signal, parenchymal
volume and CSF spaces. The midline structures are normal.

Vascular: Major flow voids are preserved.

Skull and upper cervical spine: Normal calvarium and skull base.
Visualized upper cervical spine and soft tissues are normal.

Sinuses/Orbits:No paranasal sinus fluid levels or advanced mucosal
thickening. No mastoid or middle ear effusion. Normal orbits.

MRA HEAD FINDINGS

POSTERIOR CIRCULATION:

--Vertebral arteries: Normal

--Inferior cerebellar arteries: Normal.

--Basilar artery: Normal.

--Superior cerebellar arteries: Normal.

--Posterior cerebral arteries: Normal.

ANTERIOR CIRCULATION:

--Intracranial internal carotid arteries: Normal.

--Anterior cerebral arteries (ACA): Normal.

--Middle cerebral arteries (MCA): Normal.

ANATOMIC VARIANTS: None

MRA NECK FINDINGS

Aortic arch: Normal

Right carotid system: Normal

Left carotid system: Normal

Vertebral arteries: Left dominant.  No occlusion.

Other: None.
IMPRESSION: 1. Normal MRI of the brain.
2. Normal MRA of the head and neck.

## 2021-04-06 MED ORDER — SODIUM CHLORIDE 0.9% FLUSH
3.0000 mL | Freq: Once | INTRAVENOUS | Status: AC
  Administered 2021-04-06: 3 mL via INTRAVENOUS

## 2021-04-06 MED ORDER — CLOPIDOGREL BISULFATE 75 MG PO TABS: 75.0000 mg | ORAL_TABLET | Freq: Every day | ORAL | Status: DC

## 2021-04-06 MED ORDER — ATORVASTATIN CALCIUM 40 MG PO TABS
80.0000 mg | ORAL_TABLET | Freq: Every day | ORAL | Status: DC
  Administered 2021-04-06: 80 mg via ORAL
  Filled 2021-04-06: qty 2

## 2021-04-06 MED ORDER — GADOBUTROL 1 MMOL/ML IV SOLN
6.0000 mL | Freq: Once | INTRAVENOUS | Status: AC | PRN
  Administered 2021-04-06: 6 mL via INTRAVENOUS

## 2021-04-06 MED ORDER — ASPIRIN EC 81 MG PO TBEC: 81.0000 mg | DELAYED_RELEASE_TABLET | Freq: Every day | ORAL | Status: DC

## 2021-04-06 NOTE — H&P (Signed)
Dorothy Wood [id number] DOB: Apr 18, 1961 DOA: 04/06/2021     PCP: Dorothy Lung, MD   Outpatient Specialists:     NEurology  Dr. Leonie Wood   Rheumaology Dr. Estanislado Wood Patient arrived to ER on 04/06/21 at [facility] Referred by Attending Dorothy Pence, MD   Patient coming from: home  With family    Chief Complaint: Right sided heaviness and weakness   HPI: Dorothy Wood is a 60 y.o. female with medical history significant of Sjogren's.OSA, TIA, HTN, Hypothyroidism     Presented with   sudden dizziness right-sided numbness and heaviness started at 5:30 PM and also noted decreased sensation right arm. Reports right leg feeling heavy No headache  Husband had been dealing with pancreatic cancer a lot of stressors at home Pt used to work as ICU nurse at [employer]  No family hx of early onset CVA or CAD No tobacco, drink occasional wine  Has  been vaccinated against COVID and boosted twice   Initial COVID TEST  NEGATIVE   Lab Results  Component Value Date   [place] 04/06/2021   [place] NEGATIVE 02/02/2021   [place] NEGATIVE 04/29/2020   [place] Not Detected 08/30/2019     Regarding pertinent Chronic problems:    Hyperlipidemia -   on statins Lipitor Lipid Panel     Component Value Date/Time   CHOL 171 07/22/2020 1341   TRIG 182 (H) 07/22/2020 1341   HDL 65 07/22/2020 1341   CHOLHDL 2.6 07/22/2020 1341   CHOLHDL 4.0 04/30/2020 0410   VLDL 45 (H) 04/30/2020 0410   LDLCALC 76 07/22/2020 1341   LDLDIRECT 122.2 (H) 04/06/2021 1909   LABVLDL 30 07/22/2020 1341    HTN on HCTZ   Hypothyroidism:  Lab Results  Component Value Date   TSH 2.36 05/30/2019   on synthroid     Attention deficit disorder on Ritalin  While in ER: Code stroke activated on arrival to ER NIH was score 1 patient outside of tPA window and symptoms are minimal. Neurology has been consulted CT head done and was nonacute   ED Triage Vitals  Enc Vitals Group     BP  04/06/21 1838 (!) 161/94     Pulse Rate 04/06/21 1838 91     Resp 04/06/21 1838 14     Temp 04/06/21 1838 97.7 F (36.5 C)     Temp Source 04/06/21 1838 Oral     SpO2 04/06/21 1838 100 %     Weight 04/06/21 1838 133 lb 2.5 oz (60.4 kg)     Height 04/06/21 1901 '5\' 5"'$  (1.651 m)     Head Circumference --      Peak Flow --      Pain Score --      Pain Loc --      Pain Edu? --      Excl. in [place]? --   TMAX(24)@     _________________________________________ Significant initial  Findings: Abnormal Labs Reviewed  COMPREHENSIVE METABOLIC PANEL - Abnormal; Notable for the following components:      Result Value   Sodium 134 (*)    ALT 45 (*)    All other components within normal limits  LDL CHOLESTEROL, DIRECT - Abnormal; Notable for the following components:   Direct LDL 122.2 (*)    All other components within normal limits  I-STAT CHEM 8, ED - Abnormal; Notable for the following components:   Calcium, Ion 1.10 (*)    All other components within normal limits  ____________________________________________ Ordered CT HEAD   NON acute  CXR -  NON acute MRI brain without contrast on nonacute MRA unremarkable   ECG: Ordered Personally reviewed by me showing: HR : 85 Rhythm:  NSR,    nonspecific changes,   QTC 483 _________  The recent clinical data is shown below. Vitals:   04/06/21 1901 04/06/21 1915 04/06/21 1930 04/06/21 1945  BP:  139/79 140/84 138/84  Pulse:  80 78 82  Resp:  '13 12 18  '$ Temp:      TempSrc:      SpO2:  100% 100% 100%  Weight:      Height: '5\' 5"'$  (1.651 m)       WBC     Component Value Date/Time   WBC 8.7 04/06/2021 1842   LYMPHSABS 2.9 04/06/2021 1842   MONOABS 0.7 04/06/2021 1842   EOSABS 0.1 04/06/2021 1842   BASOSABS 0.0 04/06/2021 1842       UA ordered     _______________________________________________________ ER Provider Called:  Neurology    Dorothy Wood They Recommend admit to medicine    SEEN in  ER _______________________________________________ Hospitalist was called for admission for    The following Work up has been ordered so far:  Orders Placed This Encounter  Procedures   Resp Panel by RT-PCR (Flu A&B, Covid) Nasopharyngeal Swab   CT HEAD CODE STROKE WO CONTRAST   MR ANGIO HEAD WO CONTRAST   MR ANGIO NECK W WO CONTRAST   MR BRAIN WO CONTRAST   Protime-INR   APTT   CBC   Differential   Comprehensive metabolic panel   LDL cholesterol, direct   Diet NPO time specified   Cardiac monitoring   Stroke swallow screen   NIH Stroke Scale   Modified Stroke Scale (mNIHSS) Document mNIHSS assessment every 2 hours for a total of 12 hours   Saline Lock IV, Maintain IV access   If O2 Sat <94% administer O2 at 2 liters/minute via nasal cannula   Clerk to Activate Code Stroke Call [facility] [phone]   Consult to hospitalist   Pulse oximetry, continuous   I-stat chem 8, ED   CBG monitoring, ED   I-Stat beta hCG blood, ED   ED EKG   EKG 12-Lead   ECHOCARDIOGRAM COMPLETE BUBBLE STUDY     Following Medications were ordered in ER: Medications  sodium chloride flush (NS) 0.9 % injection 3 mL (has no administration in time range)  atorvastatin (LIPITOR) tablet 80 mg (has no administration in time range)  aspirin EC tablet 81 mg (has no administration in time range)  clopidogrel (PLAVIX) tablet 75 mg (has no administration in time range)  gadobutrol (GADAVIST) 1 MMOL/ML injection 6 mL (6 mLs Intravenous Contrast Given 04/06/21 2053)        Consult Orders  (From admission, onward)           Start     Ordered   04/06/21 2156  Consult to hospitalist  Once       Provider:  (Not yet assigned)  Question Answer Comment  Place call to: [facility]   Reason for Consult Admit      04/06/21 2155            OTHER Significant initial  Findings:  labs showing:    Recent Labs  Lab 04/06/21 1842 04/06/21 1848  NA 134* 135  K 3.6 3.5  CO2 24  --   GLUCOSE  88 87  BUN 17 18  CREATININE 0.81 0.70  CALCIUM 9.8  --     Cr   stable,   Lab Results  Component Value Date   CREATININE 0.70 04/06/2021   CREATININE 0.81 04/06/2021   CREATININE 0.65 02/05/2021    Recent Labs  Lab 04/06/21 1842  AST 31  ALT 45*  ALKPHOS 93  BILITOT 0.7  PROT 7.1  ALBUMIN 4.4   Lab Results  Component Value Date   CALCIUM 9.8 04/06/2021   PHOS 2.6 02/02/2021    Plt: Lab Results  Component Value Date   PLT 276 04/06/2021        Recent Labs  Lab 04/06/21 1842 04/06/21 1848  WBC 8.7  --   NEUTROABS 4.9  --   HGB 13.4 13.9  HCT 39.7 41.0  MCV 91.7  --   PLT 276  --     HG/HCT  stable,       Component Value Date/Time   HGB 13.9 04/06/2021 1848   HCT 41.0 04/06/2021 1848   MCV 91.7 04/06/2021 1842      DM  labs:  HbA1C: Recent Labs    04/30/20 0410 02/02/21 1932 03/14/21 0000  HGBA1C 5.6 6.0* 5.7      CBG (last 3)  Recent Labs    04/06/21 1842  GLUCAP 94      Cultures: No results found for: SDES, SPECREQUEST, CULT, REPTSTATUS   Radiological Exams on Admission: MR ANGIO HEAD WO CONTRAST  Result Date: 04/06/2021 CLINICAL DATA:  Neuro deficit, acute, stroke suspected. Right upper extremity numbness EXAM: MRI HEAD WITHOUT CONTRAST MRA HEAD WITHOUT CONTRAST MRA OF THE NECK WITHOUT AND WITH CONTRAST TECHNIQUE: Multiplanar, multi-echo pulse sequences of the brain and surrounding structures were acquired without intravenous contrast. Angiographic images of the Circle of Willis were acquired using MRA technique without intravenous contrast. Angiographic images of the neck were acquired using MRA technique without and with intravenous contrast. Carotid stenosis measurements (when applicable) are obtained utilizing NASCET criteria, using the distal internal carotid diameter as the denominator. CONTRAST:  70m GADAVIST GADOBUTROL 1 MMOL/ML IV SOLN COMPARISON:  No pertinent prior exam. FINDINGS: MR HEAD FINDINGS Brain: No acute infarct, mass  effect or extra-axial collection. No acute or chronic hemorrhage. Normal white matter signal, parenchymal volume and CSF spaces. The midline structures are normal. Vascular: Major flow voids are preserved. Skull and upper cervical spine: Normal calvarium and skull base. Visualized upper cervical spine and soft tissues are normal. Sinuses/Orbits:No paranasal sinus fluid levels or advanced mucosal thickening. No mastoid or middle ear effusion. Normal orbits. MRA HEAD FINDINGS POSTERIOR CIRCULATION: --Vertebral arteries: Normal --Inferior cerebellar arteries: Normal. --Basilar artery: Normal. --Superior cerebellar arteries: Normal. --Posterior cerebral arteries: Normal. ANTERIOR CIRCULATION: --Intracranial internal carotid arteries: Normal. --Anterior cerebral arteries (ACA): Normal. --Middle cerebral arteries (MCA): Normal. ANATOMIC VARIANTS: None MRA NECK FINDINGS Aortic arch: Normal Right carotid system: Normal Left carotid system: Normal Vertebral arteries: Left dominant.  No occlusion. Other: None. IMPRESSION: 1. Normal MRI of the brain. 2. Normal MRA of the head and neck. Electronically Signed   By: KUlyses JarredM.D.   On: 04/06/2021 21:23   MR ANGIO NECK W WO CONTRAST  Result Date: 04/06/2021 CLINICAL DATA:  Neuro deficit, acute, stroke suspected. Right upper extremity numbness EXAM: MRI HEAD WITHOUT CONTRAST MRA HEAD WITHOUT CONTRAST MRA OF THE NECK WITHOUT AND WITH CONTRAST TECHNIQUE: Multiplanar, multi-echo pulse sequences of the brain and surrounding structures were acquired without intravenous contrast. Angiographic images of the Circle of Willis were acquired using MRA technique without intravenous contrast.  Angiographic images of the neck were acquired using MRA technique without and with intravenous contrast. Carotid stenosis measurements (when applicable) are obtained utilizing NASCET criteria, using the distal internal carotid diameter as the denominator. CONTRAST:  31m GADAVIST GADOBUTROL 1  MMOL/ML IV SOLN COMPARISON:  No pertinent prior exam. FINDINGS: MR HEAD FINDINGS Brain: No acute infarct, mass effect or extra-axial collection. No acute or chronic hemorrhage. Normal white matter signal, parenchymal volume and CSF spaces. The midline structures are normal. Vascular: Major flow voids are preserved. Skull and upper cervical spine: Normal calvarium and skull base. Visualized upper cervical spine and soft tissues are normal. Sinuses/Orbits:No paranasal sinus fluid levels or advanced mucosal thickening. No mastoid or middle ear effusion. Normal orbits. MRA HEAD FINDINGS POSTERIOR CIRCULATION: --Vertebral arteries: Normal --Inferior cerebellar arteries: Normal. --Basilar artery: Normal. --Superior cerebellar arteries: Normal. --Posterior cerebral arteries: Normal. ANTERIOR CIRCULATION: --Intracranial internal carotid arteries: Normal. --Anterior cerebral arteries (ACA): Normal. --Middle cerebral arteries (MCA): Normal. ANATOMIC VARIANTS: None MRA NECK FINDINGS Aortic arch: Normal Right carotid system: Normal Left carotid system: Normal Vertebral arteries: Left dominant.  No occlusion. Other: None. IMPRESSION: 1. Normal MRI of the brain. 2. Normal MRA of the head and neck. Electronically Signed   By: KUlyses JarredM.D.   On: 04/06/2021 21:23   MR BRAIN WO CONTRAST  Result Date: 04/06/2021 CLINICAL DATA:  Neuro deficit, acute, stroke suspected. Right upper extremity numbness EXAM: MRI HEAD WITHOUT CONTRAST MRA HEAD WITHOUT CONTRAST MRA OF THE NECK WITHOUT AND WITH CONTRAST TECHNIQUE: Multiplanar, multi-echo pulse sequences of the brain and surrounding structures were acquired without intravenous contrast. Angiographic images of the Circle of Willis were acquired using MRA technique without intravenous contrast. Angiographic images of the neck were acquired using MRA technique without and with intravenous contrast. Carotid stenosis measurements (when applicable) are obtained utilizing NASCET criteria,  using the distal internal carotid diameter as the denominator. CONTRAST:  635mGADAVIST GADOBUTROL 1 MMOL/ML IV SOLN COMPARISON:  No pertinent prior exam. FINDINGS: MR HEAD FINDINGS Brain: No acute infarct, mass effect or extra-axial collection. No acute or chronic hemorrhage. Normal white matter signal, parenchymal volume and CSF spaces. The midline structures are normal. Vascular: Major flow voids are preserved. Skull and upper cervical spine: Normal calvarium and skull base. Visualized upper cervical spine and soft tissues are normal. Sinuses/Orbits:No paranasal sinus fluid levels or advanced mucosal thickening. No mastoid or middle ear effusion. Normal orbits. MRA HEAD FINDINGS POSTERIOR CIRCULATION: --Vertebral arteries: Normal --Inferior cerebellar arteries: Normal. --Basilar artery: Normal. --Superior cerebellar arteries: Normal. --Posterior cerebral arteries: Normal. ANTERIOR CIRCULATION: --Intracranial internal carotid arteries: Normal. --Anterior cerebral arteries (ACA): Normal. --Middle cerebral arteries (MCA): Normal. ANATOMIC VARIANTS: None MRA NECK FINDINGS Aortic arch: Normal Right carotid system: Normal Left carotid system: Normal Vertebral arteries: Left dominant.  No occlusion. Other: None. IMPRESSION: 1. Normal MRI of the brain. 2. Normal MRA of the head and neck. Electronically Signed   By: KeUlyses Jarred.D.   On: 04/06/2021 21:23   DG CHEST PORT 1 VIEW  Result Date: 04/06/2021 CLINICAL DATA:  Dizziness with right-sided numbness and weakness. EXAM: PORTABLE CHEST 1 VIEW COMPARISON:  October 07, 2020 FINDINGS: The heart size and mediastinal contours are within normal limits. Both lungs are clear. The visualized skeletal structures are unremarkable. IMPRESSION: No active cardiopulmonary disease. Electronically Signed   By: ThVirgina Norfolk.D.   On: 04/06/2021 22:39   CT HEAD CODE STROKE WO CONTRAST  Result Date: 04/06/2021 CLINICAL DATA:  Code stroke. Neuro deficit, acute, stroke  suspected. Sudden onset of dizziness and right-sided numbness and weakness EXAM: CT HEAD WITHOUT CONTRAST TECHNIQUE: Contiguous axial images were obtained from the base of the skull through the vertex without intravenous contrast. COMPARISON:  Head CT 04/29/2020 and MRI 04/30/2020 FINDINGS: Brain: There is no evidence of an acute infarct, intracranial hemorrhage, mass, midline shift, or extra-axial fluid collection. The ventricles and sulci are normal. Vascular: No hyperdense vessel. Skull: No fracture or suspicious osseous lesion. Sinuses/Orbits: The visualized paranasal sinuses and mastoid air cells are clear. Unremarkable orbits. Other: None. ASPECTS [facility] Stroke Program Early CT Score) - Ganglionic level infarction (caudate, lentiform nuclei, internal capsule, insula, M1-M3 cortex): 7 - Supraganglionic infarction (M4-M6 cortex): 3 Total score (0-10 with 10 being normal): 10 IMPRESSION: 1. Negative head CT. 2. ASPECTS is 10. These r Esults were communicated to Dr. Quinn Axe at 6:57 pm on 04/06/2021 by text page via the [facility] messaging system. Electronically Signed   By: Logan Bores M.D.   On: 04/06/2021 18:57   _______________________________________________________________________________________________________ Latest  Blood pressure 138/84, pulse 82, temperature 97.7 F (36.5 C), temperature source Oral, resp. rate 18, height '5\' 5"'$  (1.651 m), weight 60.4 kg, SpO2 100 %.   Review of Systems:    Pertinent positives include:   slurred speech,  localizing neurological complaints,  Constitutional:  No weight loss, night sweats, Fevers, chills, fatigue, weight loss  HEENT:  No headaches, Difficulty swallowing,Tooth/dental problems,Sore throat,  No sneezing, itching, ear ache, nasal congestion, post nasal drip,  Cardio-vascular:  No chest pain, Orthopnea, PND, anasarca, dizziness, palpitations.no Bilateral lower extremity swelling  GI:  No heartburn, indigestion, abdominal pain, nausea, vomiting,  diarrhea, change in bowel habits, loss of appetite, melena, blood in stool, hematemesis Resp:  no shortness of breath at rest. No dyspnea on exertion, No excess mucus, no productive cough, No non-productive cough, No coughing up of blood.No change in color of mucus.No wheezing. Skin:  no rash or lesions. No jaundice GU:  no dysuria, change in color of urine, no urgency or frequency. No straining to urinate.  No flank pain.  Musculoskeletal:  No joint pain or no joint swelling. No decreased range of motion. No back pain.  Psych:  No change in mood or affect. No depression or anxiety. No memory loss.  Neuro: no no tingling, no weakness, no double vision, no gait abnormality, no  confusion  All systems reviewed and apart from [place] all are negative _______________________________________________________________________________________________ Past Medical History:   Past Medical History:  Diagnosis Date   Cancer ([place])    OVARIAN   Colonic polyp    Gastritis    Hyperlipidemia    Hypertension    Sjogren's disease ([place])    Thyroid disease    HYPOTHYROIDISM     Past Surgical History:  Procedure Laterality Date   ABDOMINAL HYSTERECTOMY     Ovarian cancer   BIOPSY  02/04/2021   Procedure: BIOPSY;  Surgeon: Ronald Lobo, MD;  Location: [facility];  Service: Endoscopy;;   BREAST CYST ASPIRATION     CESAREAN SECTION     x2   COLONOSCOPY  10-06   Dr. Earnest Bailey   ESOPHAGOGASTRODUODENOSCOPY N/A 02/04/2021   Procedure: ESOPHAGOGASTRODUODENOSCOPY (EGD);  Surgeon: Ronald Lobo, MD;  Location: [facility];  Service: Endoscopy;  Laterality: N/A;   TONSILLECTOMY      Social History:  Ambulatory   independently     reports that she has never smoked. She has never used smokeless tobacco. She reports current alcohol use of about 4.0 standard drinks of  alcohol per week. She reports that she does not use drugs.    Family History:   Family History  Problem Relation Age of Onset    Mental illness Mother    Hypertension Mother    Heart failure Mother    CAD Father        CABG at age 41   Mental illness Sister    Alcoholism Sister    Mental illness Brother    Alcoholism Brother    Mental illness Maternal Uncle    Mental illness Maternal Grandmother    Cancer Paternal Grandmother    ______________________________________________________________________________________________ Allergies: No Known Allergies   Prior to Admission medications   Medication Sig Start Date End Date Taking? Authorizing Provider  aspirin EC 81 MG tablet Take 81 mg by mouth every morning. Swallow whole.    [provider]  atorvastatin (LIPITOR) 40 MG tablet TAKE 1 TABLET BY MOUTH ONCE DAILY 03/28/21 03/28/22  Dorothy Lung, MD  buPROPion (WELLBUTRIN XL) 300 MG 24 hr tablet TAKE 1 TABLET BY MOUTH EVERY MORNING Patient taking differently: Take 300 mg by mouth every morning. 10/21/20 10/21/21  Eino Farber, PA-C  buPROPion (WELLBUTRIN XL) 300 MG 24 hr tablet Take 1 Q AM 03/06/21     COVID-19 mRNA Vac-TriS, Pfizer, (PFIZER-BIONT COVID-19 VAC-TRIS) SUSP injection Inject into the muscle. 03/25/21   Carlyle Basques, MD  estradiol (ESTRACE) 0.5 MG tablet TAKE 1 TABLET BY MOUTH ONCE A DAY Patient not taking: No sig reported 06/03/20 06/03/21  Vania Rea, MD  guanFACINE (TENEX) 2 MG tablet Take 1 tablet by mouth every morning and 1 tablet  by mouth at bedtime 03/06/21     hydrochlorothiazide (HYDRODIURIL) 25 MG tablet TAKE 1 TABLET BY MOUTH ONCE DAILY Patient taking differently: Take 25 mg by mouth every morning. 09/16/20 09/16/21  Lelon Perla, MD  levothyroxine (SYNTHROID) 88 MCG tablet TAKE 1 TABLET BY MOUTH ONCE DAILY Patient taking differently: Take 88 mcg by mouth every morning. 05/24/20 05/24/21  Vania Rea, MD  LORazepam (ATIVAN) 1 MG tablet Take 1 mg by mouth at bedtime as needed for anxiety or sleep.    [provider]  LORazepam (ATIVAN) 1 MG tablet Take 1 tablet (1 mg total) by  mouth daily as needed for anxiety 03/06/21     methylphenidate (RITALIN) 20 MG tablet TAKE 1 TABLET BY MOUTH 3 TIMES DAILY (FILL 01/04/21) Patient taking differently: Take 20 mg by mouth See admin instructions. Take one tablet (20 mg) by mouth 3 times daily - morning, lunch and afternoon (4pm) 11/20/20 05/19/21  Eino Farber, PA-C  methylphenidate (RITALIN) 20 MG tablet Take 1 tablet (20 mg total) by mouth 3 (three) times daily. 03/06/21     mirabegron ER (MYRBETRIQ) 50 MG TB24 tablet TAKE 1 TABLET BY MOUTH ONCE DAILY 02/12/21     ondansetron (ZOFRAN-ODT) 4 MG disintegrating tablet as needed.    [provider]  pilocarpine (SALAGEN) 5 MG tablet TAKE 1 TABLET BY MOUTH 4 TIMES DAILY Patient taking differently: Take 5 mg by mouth See admin instructions. Take one tablet (5 mg) by mouth four times daily - morning, lunch, afternoon (4pm) and bedtime 12/09/20 12/09/21  Dorothy Lung, MD  polyethylene glycol (MIRALAX / GLYCOLAX) 17 g packet Take 8.5 g by mouth 2 (two) times daily. Mix in liquid and drink    [provider]  promethazine (PHENERGAN) 25 MG suppository as needed.    [provider]    ___________________________________________________________________________________________________ Physical Exam: Vitals with BMI  04/06/2021 04/06/2021 04/06/2021  Height - - -  Weight - - -  BMI - - -  Systolic 0000000 [id number]  Diastolic 84 84 79  Pulse 82 78 80     1. General:  in No  Acute distress   Chronically ill  -appearing 2. Psychological: Alert and  Oriented 3. Head/ENT:   Dry Mucous Membranes                          Head Non traumatic, neck supple                          Normal  Dentition 4. SKIN:  decreased Skin turgor,  Skin clean Dry and intact no rash 5. Heart: Regular rate and rhythm no  Murmur, no Rub or gallop 6. Lungs: no wheezes or crackles   7. Abdomen: Soft,  non-tender, Non distended  bowel sounds present 8. Lower extremities: no clubbing, cyanosis, no   edema 9. Neurologically  strength 5 out of 5 in all 4 extremities cranial nerves II through XII intact 10. MSK: Normal range of motion    Chart has been reviewed  ______________________________________________________________________________________________  Assessment/Plan  60 y.o. female with medical history significant of Sjogren's.OSA, TIA, HTN, Hypothyroidism       Admitted for TIA  Present on Admission:  TIA (transient ischemic attack) -  - will admit based on TIA/CVA protocol, Initial Stroke scale 1       Monitor on Tele       MRA/MRI  Resulted - showing NO acute ischemic CVA        MRA head and neck no acute       Echo to evaluate for possible embolic source,        obtain cardiac enzymes,  ECG,   Lipid panel, TSH.        Order PT/OT evaluation.        keep nothing by mouth until passes swallow eval         Will make sure patient is on antiplatelet ASA 81 Plavix  and statin  ASA '81mg'$  daily + plavix '75mg'$  daily x21 days f/b plavix '75mg'$  daily monotherapy after that       Allow permissive Hypertension keep BP <220/120        Neurology consulted Have seen pt in ER      Sjogren's syndrome ([place]) - chronic stable cont home meds    Hypothyroidism- - Check TSH continue home medications at current dose   Essential hypertension - Permissive HTN x48 hrs   ADD (attention deficit disorder) - chronic continue home meds    Other plan as per orders.  DVT prophylaxis:  SCD      Code Status:    Code Status: Prior FULL CODE  as per patient  I had personally discussed CODE STATUS with patient    Family Communication:   Family not at  Bedside    Disposition Plan:    To home once workup is complete and patient is stable   Following barriers for discharge:                                                      Will need consultants to evaluate patient prior to discharge  Would benefit from PT/OT eval prior to DC  Ordered                                        Consults called: neurology is aware  Admission status:  ED Disposition     ED Disposition  Admit   Condition  --   [place]: [place] [[id number]]  Level of Care: Telemetry Medical [[id number]]  May place patient in observation at [facility] or [place] if equivalent level of care is available:: No  Covid Evaluation: Asymptomatic Screening Protocol (No Symptoms)  Diagnosis: TIA (transient ischemic attackPP:[id number]  Admitting Physician: Toy Baker [[id number]]  Attending Physician: Toy Baker [[id number]]           Obs     Level of care   tele indefinitely please discontinue once patient no longer qualifies COVID-19 Labs    Lab Results  Component Value Date   [place] 04/06/2021     Precautions: admitted as  Covid Negative     PPE: Used by the provider:   N95  eye Goggles,  Gloves    Kalandra Masters 04/07/2021, 1:12 AM    [facility]     after 2 AM please page floor coverage PA If 7AM-7PM, please contact the day team taking care of the patient using [email or web address]   Patient was evaluated in the context of the global COVID-19 pandemic, which necessitated consideration that the patient might be at risk for infection with the SARS-CoV-2 virus that causes COVID-19. Institutional protocols and algorithms that pertain to the evaluation of patients at risk for COVID-19 are in a state of rapid change based on information released by regulatory bodies including the CDC and federal and state organizations. These policies and algorithms were followed during the patient's care.

## 2021-04-06 NOTE — Consult Note (Signed)
NEUROLOGY CONSULTATION NOTE   Date of service: April 06, 2021 Patient Name: Dorothy Wood MRN:  [id number] DOB:  21-Nov-1960 Reason for consult: stroke code for R arm numbness/heaviness/weakness LKW 74 Requesting physician: Dr. 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 Exam improved in triage and by my examination her only deficit was R NLF flattening. She still had some subjective paresthesias in RUE as well. CT head showed NAICP. tPA was not administered 2/2 NIHSS = 1. CTA not performed 2/2 exam not c/w LVO.   ROS   Per HPI; all other systems reviewed and are negative  Past History   Past Medical History:  Diagnosis Date  . Cancer (HCC)    OVARIAN  . Colonic polyp   . Gastritis   . Hyperlipidemia   . Hypertension   . Sjogren's disease ([place])   . Thyroid disease    HYPOTHYROIDISM   Past Surgical History:  Procedure Laterality Date  . ABDOMINAL HYSTERECTOMY     Ovarian cancer  . BIOPSY  02/04/2021   Procedure: BIOPSY;  Surgeon: Ronald Lobo, MD;  Location: [facility];  Service: Endoscopy;;  . BREAST CYST ASPIRATION    . CESAREAN SECTION     x2  . COLONOSCOPY  10-06   Dr. Earnest Bailey  . ESOPHAGOGASTRODUODENOSCOPY N/A 02/04/2021   Procedure: ESOPHAGOGASTRODUODENOSCOPY (EGD);  Surgeon: Ronald Lobo, MD;  Location: [facility];  Service: Endoscopy;  Laterality: N/A;  . TONSILLECTOMY     Family History  Problem Relation Age of Onset  . Mental illness Mother   . Hypertension Mother   . Heart failure Mother   . CAD Father        CABG at age 1  . Mental illness Sister   . Alcoholism Sister   . Mental illness Brother   . Alcoholism Brother   . Mental illness Maternal Uncle   . Mental illness Maternal Grandmother    . Cancer Paternal Grandmother    Social History   Socioeconomic History  . Marital status: Married    Spouse name: Not on file  . Number of children: 2  . Years of education: Not on file  . Highest education level: Not on file  Occupational History  . Not on file  Tobacco Use  . Smoking status: Never  . Smokeless tobacco: Never  Vaping Use  . Vaping Use: Never used  Substance and Sexual Activity  . Alcohol use: Yes    Alcohol/week: 4.0 standard drinks    Types: 4 Glasses of wine per week  . Drug use: No  . Sexual activity: Not Currently  Other Topics Concern  . Not on file  Social History Narrative  . Not on file   Social Determinants of Health   Financial Resource Strain: Not on file  Food Insecurity: Not on file  Transportation Needs: Not on file  Physical Activity: Not on file  Stress: Not on file  Social Connections: Not on file   No Known Allergies  Medications    Current Facility-Administered Medications:  .  sodium chloride flush (NS) 0.9 % injection 3 mL, 3 mL, Intravenous, Once, Isla Pence, MD  Current Outpatient  Medications:  .  aspirin EC 81 MG tablet, Take 81 mg by mouth every morning. Swallow whole., Disp: , Rfl:  .  atorvastatin (LIPITOR) 40 MG tablet, TAKE 1 TABLET BY MOUTH ONCE DAILY, Disp: 90 tablet, Rfl: 0 .  buPROPion (WELLBUTRIN XL) 300 MG 24 hr tablet, TAKE 1 TABLET BY MOUTH EVERY MORNING (Patient taking differently: Take 300 mg by mouth every morning.), Disp: 30 tablet, Rfl: 3 .  buPROPion (WELLBUTRIN XL) 300 MG 24 hr tablet, Take 1 Q AM, Disp: 90 tablet, Rfl: 1 .  COVID-19 mRNA Vac-TriS, Pfizer, (PFIZER-BIONT COVID-19 VAC-TRIS) SUSP injection, Inject into the muscle., Disp: 0.3 mL, Rfl: 0 .  estradiol (ESTRACE) 0.5 MG tablet, TAKE 1 TABLET BY MOUTH ONCE A DAY (Patient not taking: No sig reported), Disp: 30 tablet, Rfl: 2 .  guanFACINE (TENEX) 2 MG tablet, Take 1 tablet by mouth every morning and 1 tablet  by mouth at bedtime, Disp: 180  tablet, Rfl: 1 .  hydrochlorothiazide (HYDRODIURIL) 25 MG tablet, TAKE 1 TABLET BY MOUTH ONCE DAILY (Patient taking differently: Take 25 mg by mouth every morning.), Disp: 90 tablet, Rfl: 2 .  levothyroxine (SYNTHROID) 88 MCG tablet, TAKE 1 TABLET BY MOUTH ONCE DAILY (Patient taking differently: Take 88 mcg by mouth every morning.), Disp: 90 tablet, Rfl: 3 .  LORazepam (ATIVAN) 1 MG tablet, Take 1 mg by mouth at bedtime as needed for anxiety or sleep., Disp: , Rfl:  .  LORazepam (ATIVAN) 1 MG tablet, Take 1 tablet (1 mg total) by mouth daily as needed for anxiety, Disp: 15 tablet, Rfl: 1 .  methylphenidate (RITALIN) 20 MG tablet, TAKE 1 TABLET BY MOUTH 3 TIMES DAILY (FILL 01/04/21) (Patient taking differently: Take 20 mg by mouth See admin instructions. Take one tablet (20 mg) by mouth 3 times daily - morning, lunch and afternoon (4pm)), Disp: 270 tablet, Rfl: 0 .  methylphenidate (RITALIN) 20 MG tablet, Take 1 tablet (20 mg total) by mouth 3 (three) times daily., Disp: 270 tablet, Rfl: 0 .  mirabegron ER (MYRBETRIQ) 50 MG TB24 tablet, TAKE 1 TABLET BY MOUTH ONCE DAILY, Disp: 30 tablet, Rfl: 1 .  ondansetron (ZOFRAN-ODT) 4 MG disintegrating tablet, as needed., Disp: , Rfl:  .  pilocarpine (SALAGEN) 5 MG tablet, TAKE 1 TABLET BY MOUTH 4 TIMES DAILY (Patient taking differently: Take 5 mg by mouth See admin instructions. Take one tablet (5 mg) by mouth four times daily - morning, lunch, afternoon (4pm) and bedtime), Disp: 360 tablet, Rfl: 1 .  polyethylene glycol (MIRALAX / GLYCOLAX) 17 g packet, Take 8.5 g by mouth 2 (two) times daily. Mix in liquid and drink, Disp: , Rfl:  .  promethazine (PHENERGAN) 25 MG suppository, as needed., Disp: , Rfl:      Vitals   Vitals:   04/06/21 1838  BP: (!) 161/94  Pulse: 91  Resp: 14  Temp: 97.7 F (36.5 C)  TempSrc: Oral  SpO2: 100%  Weight: 60.4 kg     Body mass index is 22.16 kg/m.  Physical Exam   Physical Exam Gen: A&O x4, NAD HEENT:  Atraumatic, normocephalic;mucous membranes moist; oropharynx clear, tongue without atrophy or fasciculations. Neck: Supple, trachea midline. Resp: CTAB, no w/r/r CV: RRR, no m/g/r; nml S1 and S2. 2+ symmetric peripheral pulses. Abd: soft/NT/ND; nabs x 4 quad Extrem: Nml bulk; no cyanosis, clubbing, or edema.  Neuro: *MS: A&O x4. Follows multi-step commands.  *Speech: fluid, nondysarthric, able to name and repeat *CN:    I: Deferred  II,III: PERRLA, VFF by confrontation, optic discs not visualized 2/2 pupillary constriction   III,IV,VI: EOMI w/o nystagmus, no ptosis   V: Sensation intact from V1 to V3 to LT   VII: Eyelid closure was full.  R NLF flattening   VIII: Hearing intact to voice   IX,X: Voice normal, palate elevates symmetrically    XI: SCM/trap 5/5 bilat   XII: Tongue protrudes midline, no atrophy or fasciculations   *Motor:   Normal bulk.  No tremor, rigidity or bradykinesia. No pronator drift.    Strength: Dlt Bic Tri WrE WrF FgS Gr HF KnF KnE PlF DoF    Left '5 5 5 5 5 5 5 5 5 5 5 5    '$ Right '5 5 5 5 5 5 5 5 5 5 5 5    '$ *Sensory: Intact to light touch, pinprick, temperature vibration throughout. Symmetric. Propioception intact bilat.  No double-simultaneous extinction.  *Coordination:  Finger-to-nose, heel-to-shin, rapid alternating motions were intact. *Reflexes:  2+ and symmetric throughout without clonus; toes down-going bilat *Gait: deferred  NIHSS = 1 for R NLF flattening   Premorbid mRS = 0   Labs   CBC:  Recent Labs  Lab 04/06/21 1842  WBC 8.7  NEUTROABS 4.9  HGB 13.4  HCT 39.7  MCV 91.7  PLT [id number]    Basic Metabolic Panel:  Lab Results  Component Value Date   NA 140 02/05/2021   K 4.1 02/05/2021   CO2 27 02/05/2021   GLUCOSE 102 (H) 02/05/2021   BUN 11 02/05/2021   CREATININE 0.65 02/05/2021   CALCIUM 8.4 (L) 02/05/2021   GFRNONAA >60 02/05/2021   GFRAA 93 07/22/2020   Lipid Panel:  Lab Results  Component Value Date   LDLCALC 76  07/22/2020   HgbA1c:  Lab Results  Component Value Date   HGBA1C 5.7 03/14/2021   Urine Drug Screen:     Component Value Date/Time   LABOPIA NONE DETECTED 04/29/2020 2103   COCAINSCRNUR NONE DETECTED 04/29/2020 2103   LABBENZ POSITIVE (A) 04/29/2020 2103   AMPHETMU NONE DETECTED 04/29/2020 2103   THCU NONE DETECTED 04/29/2020 2103   LABBARB NONE DETECTED 04/29/2020 2103    Alcohol Level     Component Value Date/Time   ETH <10 04/29/2020 2002     Impression   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 Her examination has improved and tPA was not administered 2/2 NIHSS = 1.  Recommendations   - Patient should be observed under increased monitoring protocol in ED w/ VS x 15 min and neurochecks q 15 min until she is outside of the tPA window @ 2200. If her exam declines during that time period then re-activate code stroke. - If no exam change by 2200, patient may be admitted to hospitalist service for stroke w/u - ASA '81mg'$  daily + plavix '75mg'$  daily x21 days f/b plavix '75mg'$  daily monotherapy after that - Permissive HTN x48 hrs from sx onset or until stroke ruled out by MRI goal BP <220/110. PRN labetalol or hydralazine if BP above these parameters. Avoid oral antihypertensives. - MRI brain wo contrast - MRA H&N - TTE - Check A1c and LDL + add statin per guidelines - q4 hr neuro checks - STAT head CT for any change in neuro exam - Tele - PT/OT/SLP - Stroke education - Amb  referral to neurology upon discharge   Stroke team will continue to follow.   ______________________________________________________________________   Thank you for the opportunity to take part in the care of this patient. If you have any further questions, please contact the neurology consultation attending.  Signed,  Su Monks,  MD [facility] [phone]  If 7pm- 7am, please page neurology on call as listed in [facility].

## 2021-04-06 NOTE — ED Notes (Signed)
Pt in MRI.

## 2021-04-06 NOTE — ED Notes (Signed)
Called lab regarding add on for LDL.

## 2021-04-06 NOTE — Progress Notes (Signed)
Office Visit Note  Patient: Dorothy Wood             Date of Birth: 1960/10/16           MRN: [id number]             PCP: Denita Lung, MD Referring: Denita Lung, MD Visit Date: 04/15/2021 Occupation: @GUAROCC @  Subjective:  Dry mouth and dry eyes   History of Present Illness: Dorothy Wood is a 60 y.o. female with history of sicca symptoms and osteoarthritis.  She continues to have dry mouth and dry eyes.  She has some stiffness in her hands.  She states she had a mild TIA a week ago.  She denies any joint pain or joint swelling.  There is no muscular weakness or tenderness.  Activities of Daily Living:  Patient reports morning stiffness for 0 minutes.   Patient Denies nocturnal pain.  Difficulty dressing/grooming: Denies Difficulty climbing stairs: Denies Difficulty getting out of chair: Denies Difficulty using hands for taps, buttons, cutlery, and/or writing: Denies  Review of Systems  Constitutional:  Negative for fatigue.  HENT:  Negative for mouth sores, mouth dryness and nose dryness.   Eyes:  Negative for pain, itching, visual disturbance and dryness.  Respiratory:  Negative for cough, hemoptysis, shortness of breath and difficulty breathing.   Cardiovascular:  Negative for chest pain, palpitations and swelling in legs/feet.  Gastrointestinal:  Negative for abdominal pain, blood in stool, constipation and diarrhea.  Endocrine: Negative for increased urination.  Genitourinary:  Negative for painful urination.  Musculoskeletal:  Negative for joint pain, joint pain, joint swelling, myalgias, muscle weakness, morning stiffness, muscle tenderness and myalgias.  Skin:  Negative for color change, rash and redness.  Allergic/Immunologic: Negative for susceptible to infections.  Neurological:  Positive for numbness. Negative for dizziness, headaches, memory loss and weakness.  Hematological:  Negative for swollen glands.  Psychiatric/Behavioral:  Negative for  confusion and sleep disturbance.    PMFS History:  Patient Active Problem List   Diagnosis Date Noted   Right arm weakness    Paresthesia    Gastritis 02/04/2021   Intractable nausea and vomiting 02/02/2021   Prolonged QT interval 02/02/2021   High anion gap metabolic acidosis 04/27/4817   OSA (obstructive sleep apnea) 09/10/2020   TIA (transient ischemic attack) 04/29/2020   Essential hypertension 04/29/2020   Hormone replacement therapy (HRT) 09/09/2016   Hypothyroidism 07/16/2014   Sjogren's syndrome ([place]) 04/11/2013   History of ovarian cancer 04/11/2013   Personal history of colonic polyps 04/11/2013   ADD (attention deficit disorder) 07/06/2012   Dysthymia 07/06/2012    Past Medical History:  Diagnosis Date   Cancer ([place])    OVARIAN   Colonic polyp    Gastritis    Hyperlipidemia    Hypertension    Sjogren's disease ([place])    Thyroid disease    HYPOTHYROIDISM   TIA (transient ischemic attack) 04/06/2021    Family History  Problem Relation Age of Onset   Mental illness Mother    Hypertension Mother    Heart failure Mother    CAD Father        CABG at age 69   Mental illness Sister    Alcoholism Sister    Mental illness Brother    Alcoholism Brother    Mental illness Maternal Uncle    Mental illness Maternal Grandmother    Cancer Paternal Grandmother    Past Surgical History:  Procedure Laterality Date   ABDOMINAL  HYSTERECTOMY     Ovarian cancer   BIOPSY  02/04/2021   Procedure: BIOPSY;  Surgeon: Ronald Lobo, MD;  Location: [facility];  Service: Endoscopy;;   BREAST CYST ASPIRATION     CESAREAN SECTION     x2   COLONOSCOPY  10-06   Dr. Earnest Bailey   ESOPHAGOGASTRODUODENOSCOPY N/A 02/04/2021   Procedure: ESOPHAGOGASTRODUODENOSCOPY (EGD);  Surgeon: Ronald Lobo, MD;  Location: [facility];  Service: Endoscopy;  Laterality: N/A;   TONSILLECTOMY     Social History   Social History Narrative   Not on file   Immunization History  Administered  Date(s) Administered   Influenza,inj,Quad PF,6+ Mos 05/09/2014, 06/02/2019   Influenza-Unspecified 06/14/2014, 05/16/2015, 05/15/2018, 06/20/2018, 05/25/2020   PFIZER Comirnaty(Gray Top)Covid-19 Tri-Sucrose Vaccine 03/25/2021   PFIZER(Purple Top)SARS-COV-2 Vaccination 09/09/2019, 09/29/2019, 05/07/2020   Tdap 05/09/2014     Objective: Vital Signs: BP 115/68 (BP Location: Left Arm, Patient Position: Sitting, Cuff Size: Normal)   Pulse 81   Ht $R'5\' 5"'Uv$  (1.651 m)   Wt 131 lb (59.4 kg)   BMI 21.80 kg/m    Physical Exam Vitals and nursing note reviewed.  Constitutional:      Appearance: She is well-developed.  HENT:     Head: Normocephalic and atraumatic.  Eyes:     Conjunctiva/sclera: Conjunctivae normal.  Cardiovascular:     Rate and Rhythm: Normal rate and regular rhythm.     Heart sounds: Normal heart sounds.  Pulmonary:     Effort: Pulmonary effort is normal.     Breath sounds: Normal breath sounds.  Abdominal:     General: Bowel sounds are normal.     Palpations: Abdomen is soft.  Musculoskeletal:     Cervical back: Normal range of motion.  Lymphadenopathy:     Cervical: No cervical adenopathy.  Skin:    General: Skin is warm and dry.     Capillary Refill: Capillary refill takes less than 2 seconds.  Neurological:     Mental Status: She is alert and oriented to person, place, and time.  Psychiatric:        Behavior: Behavior normal.     Musculoskeletal Exam: C-spine was in limited range of motion due to previous surgery.  Shoulder joints, elbow joints, wrist joints, MCPs PIPs and DIPs with good range of motion.  She had bilateral PIP and DIP thickening.  Hip joints and knee joints with good range of motion.  She had no tenderness over ankles or MTP joints.  She had no muscular weakness or tenderness.  CDAI Exam: CDAI Score: -- Patient Global: --; Provider Global: -- Swollen: --; Tender: -- Joint Exam 04/15/2021   No joint exam has been documented for this visit    There is currently no information documented on the homunculus. Go to the Rheumatology activity and complete the homunculus joint exam.  Investigation: No additional findings.  Imaging: MR ANGIO HEAD WO CONTRAST  Result Date: 04/06/2021 CLINICAL DATA:  Neuro deficit, acute, stroke suspected. Right upper extremity numbness EXAM: MRI HEAD WITHOUT CONTRAST MRA HEAD WITHOUT CONTRAST MRA OF THE NECK WITHOUT AND WITH CONTRAST TECHNIQUE: Multiplanar, multi-echo pulse sequences of the brain and surrounding structures were acquired without intravenous contrast. Angiographic images of the Circle of Willis were acquired using MRA technique without intravenous contrast. Angiographic images of the neck were acquired using MRA technique without and with intravenous contrast. Carotid stenosis measurements (when applicable) are obtained utilizing NASCET criteria, using the distal internal carotid diameter as the denominator. CONTRAST:  98mL GADAVIST GADOBUTROL 1  MMOL/ML IV SOLN COMPARISON:  No pertinent prior exam. FINDINGS: MR HEAD FINDINGS Brain: No acute infarct, mass effect or extra-axial collection. No acute or chronic hemorrhage. Normal white matter signal, parenchymal volume and CSF spaces. The midline structures are normal. Vascular: Major flow voids are preserved. Skull and upper cervical spine: Normal calvarium and skull base. Visualized upper cervical spine and soft tissues are normal. Sinuses/Orbits:No paranasal sinus fluid levels or advanced mucosal thickening. No mastoid or middle ear effusion. Normal orbits. MRA HEAD FINDINGS POSTERIOR CIRCULATION: --Vertebral arteries: Normal --Inferior cerebellar arteries: Normal. --Basilar artery: Normal. --Superior cerebellar arteries: Normal. --Posterior cerebral arteries: Normal. ANTERIOR CIRCULATION: --Intracranial internal carotid arteries: Normal. --Anterior cerebral arteries (ACA): Normal. --Middle cerebral arteries (MCA): Normal. ANATOMIC VARIANTS: None MRA NECK  FINDINGS Aortic arch: Normal Right carotid system: Normal Left carotid system: Normal Vertebral arteries: Left dominant.  No occlusion. Other: None. IMPRESSION: 1. Normal MRI of the brain. 2. Normal MRA of the head and neck. Electronically Signed   By: Ulyses Jarred M.D.   On: 04/06/2021 21:23   MR ANGIO NECK W WO CONTRAST  Result Date: 04/06/2021 CLINICAL DATA:  Neuro deficit, acute, stroke suspected. Right upper extremity numbness EXAM: MRI HEAD WITHOUT CONTRAST MRA HEAD WITHOUT CONTRAST MRA OF THE NECK WITHOUT AND WITH CONTRAST TECHNIQUE: Multiplanar, multi-echo pulse sequences of the brain and surrounding structures were acquired without intravenous contrast. Angiographic images of the Circle of Willis were acquired using MRA technique without intravenous contrast. Angiographic images of the neck were acquired using MRA technique without and with intravenous contrast. Carotid stenosis measurements (when applicable) are obtained utilizing NASCET criteria, using the distal internal carotid diameter as the denominator. CONTRAST:  25mL GADAVIST GADOBUTROL 1 MMOL/ML IV SOLN COMPARISON:  No pertinent prior exam. FINDINGS: MR HEAD FINDINGS Brain: No acute infarct, mass effect or extra-axial collection. No acute or chronic hemorrhage. Normal white matter signal, parenchymal volume and CSF spaces. The midline structures are normal. Vascular: Major flow voids are preserved. Skull and upper cervical spine: Normal calvarium and skull base. Visualized upper cervical spine and soft tissues are normal. Sinuses/Orbits:No paranasal sinus fluid levels or advanced mucosal thickening. No mastoid or middle ear effusion. Normal orbits. MRA HEAD FINDINGS POSTERIOR CIRCULATION: --Vertebral arteries: Normal --Inferior cerebellar arteries: Normal. --Basilar artery: Normal. --Superior cerebellar arteries: Normal. --Posterior cerebral arteries: Normal. ANTERIOR CIRCULATION: --Intracranial internal carotid arteries: Normal. --Anterior  cerebral arteries (ACA): Normal. --Middle cerebral arteries (MCA): Normal. ANATOMIC VARIANTS: None MRA NECK FINDINGS Aortic arch: Normal Right carotid system: Normal Left carotid system: Normal Vertebral arteries: Left dominant.  No occlusion. Other: None. IMPRESSION: 1. Normal MRI of the brain. 2. Normal MRA of the head and neck. Electronically Signed   By: Ulyses Jarred M.D.   On: 04/06/2021 21:23   MR BRAIN WO CONTRAST  Result Date: 04/06/2021 CLINICAL DATA:  Neuro deficit, acute, stroke suspected. Right upper extremity numbness EXAM: MRI HEAD WITHOUT CONTRAST MRA HEAD WITHOUT CONTRAST MRA OF THE NECK WITHOUT AND WITH CONTRAST TECHNIQUE: Multiplanar, multi-echo pulse sequences of the brain and surrounding structures were acquired without intravenous contrast. Angiographic images of the Circle of Willis were acquired using MRA technique without intravenous contrast. Angiographic images of the neck were acquired using MRA technique without and with intravenous contrast. Carotid stenosis measurements (when applicable) are obtained utilizing NASCET criteria, using the distal internal carotid diameter as the denominator. CONTRAST:  5mL GADAVIST GADOBUTROL 1 MMOL/ML IV SOLN COMPARISON:  No pertinent prior exam. FINDINGS: MR HEAD FINDINGS Brain: No acute infarct, mass effect or  extra-axial collection. No acute or chronic hemorrhage. Normal white matter signal, parenchymal volume and CSF spaces. The midline structures are normal. Vascular: Major flow voids are preserved. Skull and upper cervical spine: Normal calvarium and skull base. Visualized upper cervical spine and soft tissues are normal. Sinuses/Orbits:No paranasal sinus fluid levels or advanced mucosal thickening. No mastoid or middle ear effusion. Normal orbits. MRA HEAD FINDINGS POSTERIOR CIRCULATION: --Vertebral arteries: Normal --Inferior cerebellar arteries: Normal. --Basilar artery: Normal. --Superior cerebellar arteries: Normal. --Posterior cerebral  arteries: Normal. ANTERIOR CIRCULATION: --Intracranial internal carotid arteries: Normal. --Anterior cerebral arteries (ACA): Normal. --Middle cerebral arteries (MCA): Normal. ANATOMIC VARIANTS: None MRA NECK FINDINGS Aortic arch: Normal Right carotid system: Normal Left carotid system: Normal Vertebral arteries: Left dominant.  No occlusion. Other: None. IMPRESSION: 1. Normal MRI of the brain. 2. Normal MRA of the head and neck. Electronically Signed   By: Ulyses Jarred M.D.   On: 04/06/2021 21:23   DG CHEST PORT 1 VIEW  Result Date: 04/06/2021 CLINICAL DATA:  Dizziness with right-sided numbness and weakness. EXAM: PORTABLE CHEST 1 VIEW COMPARISON:  October 07, 2020 FINDINGS: The heart size and mediastinal contours are within normal limits. Both lungs are clear. The visualized skeletal structures are unremarkable. IMPRESSION: No active cardiopulmonary disease. Electronically Signed   By: Virgina Norfolk M.D.   On: 04/06/2021 22:39   ECHOCARDIOGRAM COMPLETE BUBBLE STUDY  Result Date: 04/07/2021    ECHOCARDIOGRAM REPORT   Patient Name:   FAYETTE GASNER Date of Exam: 04/07/2021 Medical Rec #:  [id number]        Height:       65.0 in Accession #:    [id number]       Weight:       133.2 lb Date of Birth:  1961-08-24         BSA:          1.664 m Patient Age:    50 years         BP:           111/59 mmHg Patient Gender: F                HR:           77 bpm. Exam Location:  Inpatient Procedure: 2D Echo, Cardiac Doppler, Color Doppler and Saline Contrast Bubble            Study Indications:    TIA G45.9  History:        Patient has prior history of Echocardiogram examinations, most                 recent 04/30/2020. Risk Factors:Hypertension and Dyslipidemia.                 Cancer. Thyroid Disease.  Sonographer:    Jonelle Sidle Dance Referring Phys: [id number]  1. Left ventricular ejection fraction, by estimation, is 55 to 60%. The left ventricle has normal function. The left ventricle has no  regional wall motion abnormalities. Left ventricular diastolic parameters were normal.  2. Right ventricular systolic function is normal. The right ventricular size is normal. Tricuspid regurgitation signal is inadequate for assessing PA pressure.  3. The mitral valve is normal in structure. No evidence of mitral valve regurgitation. No evidence of mitral stenosis.  4. The aortic valve is tricuspid. Aortic valve regurgitation is not visualized. No aortic stenosis is present.  5. The inferior vena cava is normal in size with greater than 50% respiratory variability, suggesting right  atrial pressure of 3 mmHg. FINDINGS  Left Ventricle: Left ventricular ejection fraction, by estimation, is 55 to 60%. The left ventricle has normal function. The left ventricle has no regional wall motion abnormalities. The left ventricular internal cavity size was normal in size. There is  no left ventricular hypertrophy. Left ventricular diastolic parameters were normal. Right Ventricle: The right ventricular size is normal. No increase in right ventricular wall thickness. Right ventricular systolic function is normal. Tricuspid regurgitation signal is inadequate for assessing PA pressure. Left Atrium: Left atrial size was normal in size. Right Atrium: Right atrial size was normal in size. Pericardium: There is no evidence of pericardial effusion. Mitral Valve: The mitral valve is normal in structure. No evidence of mitral valve regurgitation. No evidence of mitral valve stenosis. Tricuspid Valve: The tricuspid valve is normal in structure. Tricuspid valve regurgitation is not demonstrated. Aortic Valve: The aortic valve is tricuspid. Aortic valve regurgitation is not visualized. No aortic stenosis is present. Pulmonic Valve: The pulmonic valve was normal in structure. Pulmonic valve regurgitation is not visualized. Aorta: The aortic root is normal in size and structure. Venous: The inferior vena cava is normal in size with greater than  50% respiratory variability, suggesting right atrial pressure of 3 mmHg. IAS/Shunts: No atrial level shunt detected by color flow Doppler. Agitated saline contrast was given intravenously to evaluate for intracardiac shunting.  LEFT VENTRICLE PLAX 2D LVIDd:         3.90 cm  Diastology LVIDs:         2.60 cm  LV e' medial:    11.00 cm/s LV PW:         0.80 cm  LV E/e' medial:  7.5 LV IVS:        0.90 cm  LV e' lateral:   12.80 cm/s LVOT diam:     1.60 cm  LV E/e' lateral: 6.5 LV SV:         39 LV SV Index:   23 LVOT Area:     2.01 cm  RIGHT VENTRICLE            IVC RV Basal diam:  2.40 cm    IVC diam: 1.50 cm RV S prime:     9.57 cm/s TAPSE (M-mode): 1.8 cm LEFT ATRIUM             Index       RIGHT ATRIUM          Index LA diam:        3.00 cm 1.80 cm/m  RA Area:     8.71 cm LA Vol (A2C):   22.2 ml 13.34 ml/m RA Volume:   16.00 ml 9.61 ml/m LA Vol (A4C):   15.2 ml 9.13 ml/m LA Biplane Vol: 18.8 ml 11.30 ml/m  AORTIC VALVE LVOT Vmax:   85.00 cm/s LVOT Vmean:  61.500 cm/s LVOT VTI:    0.192 m  AORTA Ao Root diam: 3.30 cm Ao Asc diam:  3.10 cm MITRAL VALVE MV Area (PHT): 3.65 cm    SHUNTS MV Decel Time: 208 msec    Systemic VTI:  0.19 m MV E velocity: 82.60 cm/s  Systemic Diam: 1.60 cm MV A velocity: 71.70 cm/s MV E/A ratio:  1.15 Loralie Champagne MD Electronically signed by Loralie Champagne MD Signature Date/Time: 04/07/2021/4:47:48 PM    Final    CT HEAD CODE STROKE WO CONTRAST  Result Date: 04/06/2021 CLINICAL DATA:  Code stroke. Neuro deficit, acute, stroke suspected. Sudden onset of dizziness  and right-sided numbness and weakness EXAM: CT HEAD WITHOUT CONTRAST TECHNIQUE: Contiguous axial images were obtained from the base of the skull through the vertex without intravenous contrast. COMPARISON:  Head CT 04/29/2020 and MRI 04/30/2020 FINDINGS: Brain: There is no evidence of an acute infarct, intracranial hemorrhage, mass, midline shift, or extra-axial fluid collection. The ventricles and sulci are normal.  Vascular: No hyperdense vessel. Skull: No fracture or suspicious osseous lesion. Sinuses/Orbits: The visualized paranasal sinuses and mastoid air cells are clear. Unremarkable orbits. Other: None. ASPECTS [facility] Stroke Program Early CT Score) - Ganglionic level infarction (caudate, lentiform nuclei, internal capsule, insula, M1-M3 cortex): 7 - Supraganglionic infarction (M4-M6 cortex): 3 Total score (0-10 with 10 being normal): 10 IMPRESSION: 1. Negative head CT. 2. ASPECTS is 10. These r Esults were communicated to Dr. Quinn Axe at 6:57 pm on 04/06/2021 by text page via the [facility] messaging system. Electronically Signed   By: Logan Bores M.D.   On: 04/06/2021 18:57    Recent Labs: Lab Results  Component Value Date   WBC 8.7 04/06/2021   HGB 13.9 04/06/2021   PLT 276 04/06/2021   NA 135 04/06/2021   K 3.5 04/06/2021   CL 100 04/06/2021   CO2 24 04/06/2021   GLUCOSE 87 04/06/2021   BUN 18 04/06/2021   CREATININE 0.70 04/06/2021   BILITOT 0.7 04/06/2021   ALKPHOS 93 04/06/2021   AST 31 04/06/2021   ALT 45 (H) 04/06/2021   PROT 7.1 04/06/2021   ALBUMIN 4.4 04/06/2021   CALCIUM 9.8 04/06/2021   GFRAA 93 07/22/2020   March 18, 2021 UA negative, SPEP showed hypogammaglobulinemia, IFE negative, ESR 2, ANA negative, ENA negative except SSB positive, C3-C4 negative, anticardiolipin negative, beta-2 GP 1 negative, lupus anticoagulant negative, RF negative, [id number]   Speciality Comments: No specialty comments available.  Procedures:  No procedures performed Allergies: Patient has no known allergies.   Assessment / Plan:     Visit Diagnoses: Sicca complex ([place]) - dxd with Sjogren's by Dr. Justine Null.ANA-,Ro-, La+.  Lab results were discussed with the patient.  History of dry eyes she is on pilocarpine.  Over-the-counter products were discussed.  She will continue over-the-counter products.  Elevated CK -CK is mildly elevated.  She denies any history of recent muscle injury.  She had recent TIA.  I will  repeat CK and aldolase.  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  DDD (degenerative disc disease), cervical - s/p discectomy x2 by Dr. Ellene Route.  Primary osteoarthritis of both hands-joint protection muscle strengthening was discussed.  Essential hypertension-  Prolonged QT interval - She reports that it was drug-induced while she was in the hospital.  TIA (transient ischemic attack) - In 2020.  She had another TIA on July 24.  She states that she was started on Lipitor and Plavix.  Dyslipidemia-she is on Lipitor now.  Elevated LFTs - Related to alcohol intake.  LFTs are improving as she is cut back on the alcohol consumption.  History of ovarian cancer  History of hypothyroidism  Personal history of colonic polyps  Attention deficit hyperactivity disorder (ADHD), unspecified ADHD type  OSA (obstructive sleep apnea)   Orders: Orders Placed This Encounter  Procedures   Aldolase   CK isoenzymes (brain, muscle injury)    No orders of the defined types were placed in this encounter.   Follow-Up Instructions: Return in about 6 months (around 10/16/2021) for sicca.   Abel Presto  Estanislado Pandy, MD  Note - This record has been created using Editor, commissioning.  Chart creation errors have been sought, but may not always  have been located. Such creation errors do not reflect on  the standard of medical care.

## 2021-04-06 NOTE — ED Triage Notes (Addendum)
Pt reports sudden onset dizziness and R sided numbness/weakness/"heaviness" that started at 5:30pm.  No arm drift.  Decreased sensation to R arm.  Code Stroke activated and pt to bridge.  Dr. Gilford Raid at bridge to assess pt.

## 2021-04-06 NOTE — ED Provider Notes (Signed)
[place] EMERGENCY DEPARTMENT Provider Note   CSN: [id number] Arrival date & time: 04/06/21  [id number]  An emergency department physician performed an initial assessment on this suspected stroke patient at 1844.  History No chief complaint on file.   Dorothy Wood is a 60 y.o. female.  Pt presents to the ED today with right sided numbness and heaviness on the right. Sx started at 5:30 pm.  She does have a hx of TIA.  She thought sx would get better, but when they did not, she came in.  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  Pt came in [place] and a code stroke was activated in triage.        Past Medical History:  Diagnosis Date   Cancer ([place])    OVARIAN   Colonic polyp    Gastritis    Hyperlipidemia    Hypertension    Sjogren's disease ([place])    Thyroid disease    HYPOTHYROIDISM    Patient Active Problem List   Diagnosis Date Noted   Right arm weakness    Paresthesia    Gastritis 02/04/2021   Intractable nausea and vomiting 02/02/2021   Prolonged QT interval 02/02/2021   High anion gap metabolic acidosis [id number]   OSA (obstructive sleep apnea) 09/10/2020   TIA (transient ischemic attack) 04/29/2020   Essential hypertension 04/29/2020   Hormone replacement therapy (HRT) 09/09/2016   Hypothyroidism 07/16/2014   Sjogren's syndrome ([place]) 04/11/2013   History of ovarian cancer 04/11/2013   Personal history of colonic polyps 04/11/2013   ADD (attention deficit disorder) 07/06/2012   Dysthymia 07/06/2012    Past Surgical History:  Procedure Laterality Date   ABDOMINAL HYSTERECTOMY     Ovarian cancer   BIOPSY  02/04/2021   Procedure: BIOPSY;  Surgeon: Ronald Lobo, MD;  Location: [facility] END[facility]COPY;  Service: Endoscopy;;   BREAST CYST ASPIRATION     CESAREAN SECTION     x2   COLONOSCOPY  10-06   Dr. Earnest Bailey   ESOPHAGOGASTRODUODENOSCOPY N/A 02/04/2021   Procedure: ESOPHAGOGASTRODUODENOSCOPY (EGD);  Surgeon: Ronald Lobo, MD;  Location: [facility];   Service: Endoscopy;  Laterality: N/A;   TONSILLECTOMY       OB History   No obstetric history on file.     Family History  Problem Relation Age of Onset   Mental illness Mother    Hypertension Mother    Heart failure Mother    CAD Father        CABG at age 32   Mental illness Sister    Alcoholism Sister    Mental illness Brother    Alcoholism Brother    Mental illness Maternal Uncle    Mental illness Maternal Grandmother    Cancer Paternal Grandmother     Social History   Tobacco Use   Smoking status: Never   Smokeless tobacco: Never  Vaping Use   Vaping Use: Never used  Substance Use Topics   Alcohol use: Yes    Alcohol/week: 4.0 standard drinks    Types: 4 Glasses of wine per week   Drug use: No    Home Medications Prior to Admission medications   Medication Sig Start Date End Date Taking? Authorizing Provider  aspirin EC 81 MG tablet Take 81 mg by mouth every morning. Swallow whole.    [provider]  atorvastatin (LIPITOR) 40 MG tablet TAKE 1 TABLET BY MOUTH ONCE DAILY 03/28/21 03/28/22  Denita Lung, MD  buPROPion (WELLBUTRIN XL) 300 MG  24 hr tablet TAKE 1 TABLET BY MOUTH EVERY MORNING Patient taking differently: Take 300 mg by mouth every morning. 10/21/20 10/21/21  Eino Farber, PA-C  buPROPion (WELLBUTRIN XL) 300 MG 24 hr tablet Take 1 Q AM 03/06/21     COVID-19 mRNA Vac-TriS, Pfizer, (PFIZER-BIONT COVID-19 VAC-TRIS) SUSP injection Inject into the muscle. 03/25/21   Carlyle Basques, MD  estradiol (ESTRACE) 0.5 MG tablet TAKE 1 TABLET BY MOUTH ONCE A DAY Patient not taking: No sig reported 06/03/20 06/03/21  Vania Rea, MD  guanFACINE (TENEX) 2 MG tablet Take 1 tablet by mouth every morning and 1 tablet  by mouth at bedtime 03/06/21     hydrochlorothiazide (HYDRODIURIL) 25 MG tablet TAKE 1 TABLET BY MOUTH ONCE DAILY Patient taking differently: Take 25 mg by mouth every morning. 09/16/20 09/16/21  Lelon Perla, MD  levothyroxine (SYNTHROID) 88 MCG tablet  TAKE 1 TABLET BY MOUTH ONCE DAILY Patient taking differently: Take 88 mcg by mouth every morning. 05/24/20 05/24/21  Vania Rea, MD  LORazepam (ATIVAN) 1 MG tablet Take 1 mg by mouth at bedtime as needed for anxiety or sleep.    [provider]  LORazepam (ATIVAN) 1 MG tablet Take 1 tablet (1 mg total) by mouth daily as needed for anxiety 03/06/21     methylphenidate (RITALIN) 20 MG tablet TAKE 1 TABLET BY MOUTH 3 TIMES DAILY (FILL 01/04/21) Patient taking differently: Take 20 mg by mouth See admin instructions. Take one tablet (20 mg) by mouth 3 times daily - morning, lunch and afternoon (4pm) 11/20/20 05/19/21  Eino Farber, PA-C  methylphenidate (RITALIN) 20 MG tablet Take 1 tablet (20 mg total) by mouth 3 (three) times daily. 03/06/21     mirabegron ER (MYRBETRIQ) 50 MG TB24 tablet TAKE 1 TABLET BY MOUTH ONCE DAILY 02/12/21     ondansetron (ZOFRAN-ODT) 4 MG disintegrating tablet as needed.    [provider]  pilocarpine (SALAGEN) 5 MG tablet TAKE 1 TABLET BY MOUTH 4 TIMES DAILY Patient taking differently: Take 5 mg by mouth See admin instructions. Take one tablet (5 mg) by mouth four times daily - morning, lunch, afternoon (4pm) and bedtime 12/09/20 12/09/21  Denita Lung, MD  polyethylene glycol (MIRALAX / GLYCOLAX) 17 g packet Take 8.5 g by mouth 2 (two) times daily. Mix in liquid and drink    [provider]  promethazine (PHENERGAN) 25 MG suppository as needed.    [provider]    Allergies    Patient has no known allergies.  Review of Systems   Review of Systems  Neurological:        Right arm weakness  All other systems reviewed and are negative.  Physical Exam Updated Vital Signs BP 138/84   Pulse 82   Temp 97.7 F (36.5 C) (Oral)   Resp 18   Ht '5\' 5"'$  (1.651 m)   Wt 60.4 kg   SpO2 100%   BMI 22.16 kg/m   Physical Exam Vitals and nursing note reviewed.  Constitutional:      Appearance: Normal appearance.  HENT:     Head: Normocephalic  and atraumatic.     Right Ear: External ear normal.     Left Ear: External ear normal.     Nose: Nose normal.     Mouth/Throat:     Mouth: Mucous membranes are moist.     Pharynx: Oropharynx is clear.  Eyes:     Extraocular Movements: Extraocular movements intact.     Conjunctiva/sclera: Conjunctivae normal.  Pupils: Pupils are equal, round, and reactive to light.  Cardiovascular:     Rate and Rhythm: Normal rate and regular rhythm.     Pulses: Normal pulses.     Heart sounds: Normal heart sounds.  Pulmonary:     Effort: Pulmonary effort is normal.     Breath sounds: Normal breath sounds.  Abdominal:     General: Abdomen is flat. Bowel sounds are normal.     Palpations: Abdomen is soft.  Musculoskeletal:        General: Normal range of motion.     Cervical back: Normal range of motion and neck supple.  Skin:    General: Skin is warm.     Capillary Refill: Capillary refill takes less than 2 seconds.  Neurological:     Mental Status: She is alert and oriented to person, place, and time.     Comments: Right arm numbness.    Psychiatric:        Mood and Affect: Mood normal.        Behavior: Behavior normal.        Thought Content: Thought content normal.        Judgment: Judgment normal.    ED Results / Procedures / Treatments   Labs (all labs ordered are listed, but only abnormal results are displayed) Labs Reviewed  COMPREHENSIVE METABOLIC PANEL - Abnormal; Notable for the following components:      Result Value   Sodium 134 (*)    ALT 45 (*)    All other components within normal limits  LDL CHOLESTEROL, DIRECT - Abnormal; Notable for the following components:   Direct LDL 122.2 (*)    All other components within normal limits  I-STAT CHEM 8, ED - Abnormal; Notable for the following components:   Calcium, Ion 1.10 (*)    All other components within normal limits  RESP PANEL BY RT-PCR (FLU A&B, COVID) ARPGX2  PROTIME-INR  APTT  CBC  DIFFERENTIAL  CBG  MONITORING, ED  I-STAT BETA HCG BLOOD, ED ([facility], [facility], [facility] ONLY)    EKG EKG Interpretation  Date/Time:  Sunday April 06 2021 19:00:09 EDT Ventricular Rate:  85 PR Interval:  159 QRS Duration: 104 QT Interval:  406 QTC Calculation: 483 R Axis:   -17 Text Interpretation: Sinus rhythm Borderline left axis deviation Abnormal R-wave progression, early transition Minimal ST elevation, inferior leads No significant change since last tracing Confirmed by Isla Pence [phone]) on 04/06/2021 7:43:07 PM  Radiology MR ANGIO HEAD WO CONTRAST  Result Date: 04/06/2021 CLINICAL DATA:  Neuro deficit, acute, stroke suspected. Right upper extremity numbness EXAM: MRI HEAD WITHOUT CONTRAST MRA HEAD WITHOUT CONTRAST MRA OF THE NECK WITHOUT AND WITH CONTRAST TECHNIQUE: Multiplanar, multi-echo pulse sequences of the brain and surrounding structures were acquired without intravenous contrast. Angiographic images of the Circle of Willis were acquired using MRA technique without intravenous contrast. Angiographic images of the neck were acquired using MRA technique without and with intravenous contrast. Carotid stenosis measurements (when applicable) are obtained utilizing NASCET criteria, using the distal internal carotid diameter as the denominator. CONTRAST:  68m GADAVIST GADOBUTROL 1 MMOL/ML IV SOLN COMPARISON:  No pertinent prior exam. FINDINGS: MR HEAD FINDINGS Brain: No acute infarct, mass effect or extra-axial collection. No acute or chronic hemorrhage. Normal white matter signal, parenchymal volume and CSF spaces. The midline structures are normal. Vascular: Major flow voids are preserved. Skull and upper cervical spine: Normal calvarium and skull base. Visualized upper cervical spine and soft tissues are normal.  Sinuses/Orbits:No paranasal sinus fluid levels or advanced mucosal thickening. No mastoid or middle ear effusion. Normal orbits. MRA HEAD FINDINGS POSTERIOR CIRCULATION: --Vertebral arteries: Normal --Inferior  cerebellar arteries: Normal. --Basilar artery: Normal. --Superior cerebellar arteries: Normal. --Posterior cerebral arteries: Normal. ANTERIOR CIRCULATION: --Intracranial internal carotid arteries: Normal. --Anterior cerebral arteries (ACA): Normal. --Middle cerebral arteries (MCA): Normal. ANATOMIC VARIANTS: None MRA NECK FINDINGS Aortic arch: Normal Right carotid system: Normal Left carotid system: Normal Vertebral arteries: Left dominant.  No occlusion. Other: None. IMPRESSION: 1. Normal MRI of the brain. 2. Normal MRA of the head and neck. Electronically Signed   By: Ulyses Jarred M.D.   On: 04/06/2021 21:23   MR ANGIO NECK W WO CONTRAST  Result Date: 04/06/2021 CLINICAL DATA:  Neuro deficit, acute, stroke suspected. Right upper extremity numbness EXAM: MRI HEAD WITHOUT CONTRAST MRA HEAD WITHOUT CONTRAST MRA OF THE NECK WITHOUT AND WITH CONTRAST TECHNIQUE: Multiplanar, multi-echo pulse sequences of the brain and surrounding structures were acquired without intravenous contrast. Angiographic images of the Circle of Willis were acquired using MRA technique without intravenous contrast. Angiographic images of the neck were acquired using MRA technique without and with intravenous contrast. Carotid stenosis measurements (when applicable) are obtained utilizing NASCET criteria, using the distal internal carotid diameter as the denominator. CONTRAST:  34m GADAVIST GADOBUTROL 1 MMOL/ML IV SOLN COMPARISON:  No pertinent prior exam. FINDINGS: MR HEAD FINDINGS Brain: No acute infarct, mass effect or extra-axial collection. No acute or chronic hemorrhage. Normal white matter signal, parenchymal volume and CSF spaces. The midline structures are normal. Vascular: Major flow voids are preserved. Skull and upper cervical spine: Normal calvarium and skull base. Visualized upper cervical spine and soft tissues are normal. Sinuses/Orbits:No paranasal sinus fluid levels or advanced mucosal thickening. No mastoid or middle ear  effusion. Normal orbits. MRA HEAD FINDINGS POSTERIOR CIRCULATION: --Vertebral arteries: Normal --Inferior cerebellar arteries: Normal. --Basilar artery: Normal. --Superior cerebellar arteries: Normal. --Posterior cerebral arteries: Normal. ANTERIOR CIRCULATION: --Intracranial internal carotid arteries: Normal. --Anterior cerebral arteries (ACA): Normal. --Middle cerebral arteries (MCA): Normal. ANATOMIC VARIANTS: None MRA NECK FINDINGS Aortic arch: Normal Right carotid system: Normal Left carotid system: Normal Vertebral arteries: Left dominant.  No occlusion. Other: None. IMPRESSION: 1. Normal MRI of the brain. 2. Normal MRA of the head and neck. Electronically Signed   By: KUlyses JarredM.D.   On: 04/06/2021 21:23   MR BRAIN WO CONTRAST  Result Date: 04/06/2021 CLINICAL DATA:  Neuro deficit, acute, stroke suspected. Right upper extremity numbness EXAM: MRI HEAD WITHOUT CONTRAST MRA HEAD WITHOUT CONTRAST MRA OF THE NECK WITHOUT AND WITH CONTRAST TECHNIQUE: Multiplanar, multi-echo pulse sequences of the brain and surrounding structures were acquired without intravenous contrast. Angiographic images of the Circle of Willis were acquired using MRA technique without intravenous contrast. Angiographic images of the neck were acquired using MRA technique without and with intravenous contrast. Carotid stenosis measurements (when applicable) are obtained utilizing NASCET criteria, using the distal internal carotid diameter as the denominator. CONTRAST:  634mGADAVIST GADOBUTROL 1 MMOL/ML IV SOLN COMPARISON:  No pertinent prior exam. FINDINGS: MR HEAD FINDINGS Brain: No acute infarct, mass effect or extra-axial collection. No acute or chronic hemorrhage. Normal white matter signal, parenchymal volume and CSF spaces. The midline structures are normal. Vascular: Major flow voids are preserved. Skull and upper cervical spine: Normal calvarium and skull base. Visualized upper cervical spine and soft tissues are normal.  Sinuses/Orbits:No paranasal sinus fluid levels or advanced mucosal thickening. No mastoid or middle ear effusion. Normal orbits. MRA HEAD  FINDINGS POSTERIOR CIRCULATION: --Vertebral arteries: Normal --Inferior cerebellar arteries: Normal. --Basilar artery: Normal. --Superior cerebellar arteries: Normal. --Posterior cerebral arteries: Normal. ANTERIOR CIRCULATION: --Intracranial internal carotid arteries: Normal. --Anterior cerebral arteries (ACA): Normal. --Middle cerebral arteries (MCA): Normal. ANATOMIC VARIANTS: None MRA NECK FINDINGS Aortic arch: Normal Right carotid system: Normal Left carotid system: Normal Vertebral arteries: Left dominant.  No occlusion. Other: None. IMPRESSION: 1. Normal MRI of the brain. 2. Normal MRA of the head and neck. Electronically Signed   By: Ulyses Jarred M.D.   On: 04/06/2021 21:23   CT HEAD CODE STROKE WO CONTRAST  Result Date: 04/06/2021 CLINICAL DATA:  Code stroke. Neuro deficit, acute, stroke suspected. Sudden onset of dizziness and right-sided numbness and weakness EXAM: CT HEAD WITHOUT CONTRAST TECHNIQUE: Contiguous axial images were obtained from the base of the skull through the vertex without intravenous contrast. COMPARISON:  Head CT 04/29/2020 and MRI 04/30/2020 FINDINGS: Brain: There is no evidence of an acute infarct, intracranial hemorrhage, mass, midline shift, or extra-axial fluid collection. The ventricles and sulci are normal. Vascular: No hyperdense vessel. Skull: No fracture or suspicious osseous lesion. Sinuses/Orbits: The visualized paranasal sinuses and mastoid air cells are clear. Unremarkable orbits. Other: None. ASPECTS [facility] Stroke Program Early CT Score) - Ganglionic level infarction (caudate, lentiform nuclei, internal capsule, insula, M1-M3 cortex): 7 - Supraganglionic infarction (M4-M6 cortex): 3 Total score (0-10 with 10 being normal): 10 IMPRESSION: 1. Negative head CT. 2. ASPECTS is 10. These r Esults were communicated to Dr. Quinn Axe at 6:57  pm on 04/06/2021 by text page via the [facility] messaging system. Electronically Signed   By: Logan Bores M.D.   On: 04/06/2021 18:57    Procedures Procedures   Medications Ordered in ED Medications  sodium chloride flush (NS) 0.9 % injection 3 mL (has no administration in time range)  atorvastatin (LIPITOR) tablet 80 mg (has no administration in time range)  aspirin EC tablet 81 mg (has no administration in time range)  clopidogrel (PLAVIX) tablet 75 mg (has no administration in time range)  gadobutrol (GADAVIST) 1 MMOL/ML injection 6 mL (6 mLs Intravenous Contrast Given 04/06/21 2053)    ED Course  I have reviewed the triage vital signs and the nursing notes.  Pertinent labs & imaging results that were available during my care of the patient were reviewed by me and considered in my medical decision making (see chart for details).    MDM Rules/Calculators/A&P                           CT head neg.  Sx not severe enough to qualify for tpa.  She is in the window for tpa until 10 pm.  If stable, then she can be admitted to the hospitalist.  If sx worsen, we are to call another code stroke.   Pt has remained stable.  MRI nl.  Pt d/w Dr. Roel Cluck (triad) for admission.  CRITICAL CARE Performed by: Isla Pence   Total critical care time: 30 minutes  Critical care time was exclusive of separately billable procedures and treating other patients.  Critical care was necessary to treat or prevent imminent or life-threatening deterioration.  Critical care was time spent personally by me on the following activities: development of treatment plan with patient and/or surrogate as well as nursing, discussions with consultants, evaluation of patient's response to treatment, examination of patient, obtaining history from patient or surrogate, ordering and performing treatments and interventions, ordering and review of laboratory  studies, ordering and review of radiographic studies, pulse oximetry  and re-evaluation of patient's condition.   Final Clinical Impression(s) / ED Diagnoses Final diagnoses:  TIA (transient ischemic attack)    Rx / DC Orders ED Discharge Orders     None        Isla Pence, MD 04/06/21 2213

## 2021-04-06 NOTE — ED Notes (Signed)
Pt back to room.

## 2021-04-06 NOTE — Code Documentation (Addendum)
Responded to Code Stroke called at 1844 on pt already in triage for R sided numbness, [id number]. CBG-94, NIH-1 for R flattened nasolabial fold, CT head negative. TPA not given-"too good to treat." Pt is outside tPA window at 2200. Q36mn neuro and q135m VS until then. Plan to admit.

## 2021-04-06 NOTE — ED Notes (Signed)
Patient transported to MRI 

## 2021-04-07 ENCOUNTER — Observation Stay (HOSPITAL_BASED_OUTPATIENT_CLINIC_OR_DEPARTMENT_OTHER): Payer: 59

## 2021-04-07 ENCOUNTER — Other Ambulatory Visit (HOSPITAL_COMMUNITY): Payer: Self-pay

## 2021-04-07 DIAGNOSIS — Z8543 Personal history of malignant neoplasm of ovary: Secondary | ICD-10-CM

## 2021-04-07 DIAGNOSIS — Z20822 Contact with and (suspected) exposure to covid-19: Secondary | ICD-10-CM | POA: Diagnosis not present

## 2021-04-07 DIAGNOSIS — F419 Anxiety disorder, unspecified: Secondary | ICD-10-CM

## 2021-04-07 DIAGNOSIS — F449 Dissociative and conversion disorder, unspecified: Secondary | ICD-10-CM

## 2021-04-07 DIAGNOSIS — M3500 Sicca syndrome, unspecified: Secondary | ICD-10-CM | POA: Diagnosis not present

## 2021-04-07 DIAGNOSIS — Z7982 Long term (current) use of aspirin: Secondary | ICD-10-CM | POA: Diagnosis not present

## 2021-04-07 DIAGNOSIS — I10 Essential (primary) hypertension: Secondary | ICD-10-CM

## 2021-04-07 DIAGNOSIS — E039 Hypothyroidism, unspecified: Secondary | ICD-10-CM | POA: Diagnosis not present

## 2021-04-07 DIAGNOSIS — G459 Transient cerebral ischemic attack, unspecified: Secondary | ICD-10-CM | POA: Diagnosis not present

## 2021-04-07 DIAGNOSIS — Z79899 Other long term (current) drug therapy: Secondary | ICD-10-CM | POA: Diagnosis not present

## 2021-04-07 DIAGNOSIS — G4733 Obstructive sleep apnea (adult) (pediatric): Secondary | ICD-10-CM

## 2021-04-07 LAB — LIPID PANEL
Cholesterol: 195 mg/dL (ref 0–200)
HDL: 69 mg/dL (ref >40)
LDL Cholesterol: 87 mg/dL (ref 0–99)
Total CHOL/HDL Ratio: 2.8 RATIO
Triglycerides: 193 mg/dL — ABNORMAL HIGH (ref <150)
VLDL: 39 mg/dL (ref 0–40)

## 2021-04-07 LAB — ECHOCARDIOGRAM COMPLETE BUBBLE STUDY
Area-P 1/2: 3.65 cm2
Height: 65 in
S' Lateral: 2.6 cm
Weight: 2130.53 oz

## 2021-04-07 LAB — HEMOGLOBIN A1C
Hgb A1c MFr Bld: 5.6 % (ref 4.8–5.6)
Mean Plasma Glucose: 114 mg/dL

## 2021-04-07 LAB — TSH: TSH: 1.236 u[IU]/mL (ref 0.350–4.500)

## 2021-04-07 MED ORDER — ACETAMINOPHEN 160 MG/5ML PO SOLN: 650.0000 mg | ORAL | Status: DC | PRN

## 2021-04-07 MED ORDER — MIRABEGRON ER 50 MG PO TB24
50.0000 mg | ORAL_TABLET | Freq: Every day | ORAL | Status: DC
  Filled 2021-04-07: qty 1

## 2021-04-07 MED ORDER — ATORVASTATIN CALCIUM 80 MG PO TABS
80.0000 mg | ORAL_TABLET | Freq: Every day | ORAL | 0 refills | Status: DC
  Filled 2021-04-07: qty 90, 90d supply, fill #0

## 2021-04-07 MED ORDER — CLOPIDOGREL BISULFATE 75 MG PO TABS
75.0000 mg | ORAL_TABLET | Freq: Every day | ORAL | 0 refills | Status: DC
  Filled 2021-04-07: qty 21, 21d supply, fill #0

## 2021-04-07 MED ORDER — ACETAMINOPHEN 325 MG PO TABS: 650.0000 mg | ORAL_TABLET | ORAL | Status: DC | PRN

## 2021-04-07 MED ORDER — STROKE: EARLY STAGES OF RECOVERY BOOK: Freq: Once | Status: DC

## 2021-04-07 MED ORDER — POLYETHYLENE GLYCOL 3350 17 G PO PACK: 8.5000 g | PACK | Freq: Two times a day (BID) | ORAL | Status: DC

## 2021-04-07 MED ORDER — PILOCARPINE HCL 5 MG PO TABS
5.0000 mg | ORAL_TABLET | Freq: Four times a day (QID) | ORAL | Status: DC
  Filled 2021-04-07 (×5): qty 1

## 2021-04-07 MED ORDER — BUPROPION HCL ER (XL) 300 MG PO TB24
300.0000 mg | ORAL_TABLET | Freq: Every morning | ORAL | Status: DC
  Filled 2021-04-07: qty 1

## 2021-04-07 MED ORDER — ACETAMINOPHEN 650 MG RE SUPP: 650.0000 mg | RECTAL | Status: DC | PRN

## 2021-04-07 MED ORDER — SODIUM CHLORIDE 0.9 % IV SOLN: INTRAVENOUS | Status: DC

## 2021-04-07 MED ORDER — METHYLPHENIDATE HCL 20 MG PO TABS: 20.0000 mg | ORAL_TABLET | Freq: Three times a day (TID) | ORAL | Status: DC

## 2021-04-07 NOTE — Discharge Summary (Signed)
Physician Discharge Summary  Dorothy Wood [id number] DOB: 1961-07-06 DOA: 04/06/2021  PCP: Denita Lung, MD  Admit date: 04/06/2021 Discharge date: 04/07/2021  Admitted From: Home Disposition: Home  Recommendations for Outpatient Follow-up:  Follow up with PCP in 1-2 weeks Please obtain BMP/CBC in one week Outpatient follow-up with neurology in 4 weeks  Home Health: Equipment/Devices:  Discharge Condition: Stable CODE STATUS: Full code Diet recommendation: Heart healthy  Brief/Interim Summary: 60 year old female with history of hypertension, hyperlipidemia, Sjogren's disease, ovarian cancer, admitted with right arm and leg numbness.  She also had right face tightness.  She was evaluated in the emergency room with CT scan of the head, MRI brain and MRA head.  She also had imaging of her neck, all found to be negative.  She did not have any evidence of acute infarct.  LDL was 87.  2D echocardiogram unremarkable.  It was felt that etiology of her symptoms included possible TIA versus anxiety as patient related that her husband has pancreatic cancer which is causing her a lot of stress.  It was recommended that she continue dual antiplatelet therapy for the next 3 weeks with aspirin and Plavix followed by aspirin alone.  Her Lipitor dose was also increased from 40 mg to 80 mg.  She was seen by PT/OT without any other recommendations.  Follow-up has been scheduled with neurology in 4 weeks.  She was otherwise cleared for discharge home.  Discharge Diagnoses:  Active Problems:   ADD (attention deficit disorder)   Sjogren's syndrome (HCC)   History of ovarian cancer   Hypothyroidism   TIA (transient ischemic attack)   Essential hypertension   OSA (obstructive sleep apnea)    Discharge Instructions  Discharge Instructions     Ambulatory referral to Neurology   Complete by: As directed    Follow up with stroke clinic NP (Jessica Vanschaick or Cecille Rubin, if both not  available, consider Zachery Dauer, or Ahern) at [facility] in about 4 weeks. Thanks.   Diet - low sodium heart healthy   Complete by: As directed    Increase activity slowly   Complete by: As directed       Allergies as of 04/07/2021   No Known Allergies      Medication List     STOP taking these medications    estradiol 0.5 MG tablet Commonly known as: ESTRACE       TAKE these medications    aspirin EC 81 MG tablet Take 81 mg by mouth every morning. Swallow whole.   atorvastatin 80 MG tablet Commonly known as: LIPITOR Take 1 tablet (80 mg total) by mouth daily. What changed:  medication strength how much to take   buPROPion 300 MG 24 hr tablet Commonly known as: WELLBUTRIN XL TAKE 1 TABLET BY MOUTH EVERY MORNING What changed:  how much to take Another medication with the same name was removed. Continue taking this medication, and follow the directions you see here.   clopidogrel 75 MG tablet Commonly known as: PLAVIX Take 1 tablet (75 mg total) by mouth daily. Start taking on: April 08, 2021   guanFACINE 2 MG tablet Commonly known as: TENEX Take 1 tablet by mouth every morning and 1 tablet  by mouth at bedtime What changed:  how much to take how to take this when to take this   hydrochlorothiazide 25 MG tablet Commonly known as: HYDRODIURIL TAKE 1 TABLET BY MOUTH ONCE DAILY What changed:  how much to take when to take  this   levothyroxine 88 MCG tablet Commonly known as: SYNTHROID TAKE 1 TABLET BY MOUTH ONCE DAILY What changed:  how much to take when to take this   LORazepam 1 MG tablet Commonly known as: Ativan Take 1 tablet (1 mg total) by mouth daily as needed for anxiety What changed: reasons to take this   Myrbetriq 50 MG Tb24 tablet Generic drug: mirabegron ER TAKE 1 TABLET BY MOUTH ONCE DAILY What changed:  how much to take when to take this   Pfizer-BioNT COVID-19 Vac-TriS Susp injection Generic drug: COVID-19 mRNA Vac-TriS  (Pfizer) Inject into the muscle.   pilocarpine 5 MG tablet Commonly known as: SALAGEN TAKE 1 TABLET BY MOUTH 4 TIMES DAILY What changed:  how much to take when to take this additional instructions   polyethylene glycol 17 g packet Commonly known as: MIRALAX / GLYCOLAX Take 8.5 g by mouth daily. Mix in liquid and drink   Ritalin 20 MG tablet Generic drug: methylphenidate TAKE 1 TABLET BY MOUTH 3 TIMES DAILY (FILL 01/04/21) What changed:  how much to take how to take this when to take this additional instructions Another medication with the same name was removed. Continue taking this medication, and follow the directions you see here.        Follow-up Information     [name]. Schedule an appointment as soon as possible for a visit in 1 month(s).   Specialty: Neurology Why: stroke clinic Contact information: [address] [place] [phone]        Denita Lung, MD. Schedule an appointment as soon as possible for a visit in 2 week(s).   Specialty: Family Medicine Contact information: [address] [place] [address] [phone]                No Known Allergies  Consultations: Neurology   Procedures/Studies: MR ANGIO HEAD WO CONTRAST  Result Date: 04/06/2021 CLINICAL DATA:  Neuro deficit, acute, stroke suspected. Right upper extremity numbness EXAM: MRI HEAD WITHOUT CONTRAST MRA HEAD WITHOUT CONTRAST MRA OF THE NECK WITHOUT AND WITH CONTRAST TECHNIQUE: Multiplanar, multi-echo pulse sequences of the brain and surrounding structures were acquired without intravenous contrast. Angiographic images of the Circle of Willis were acquired using MRA technique without intravenous contrast. Angiographic images of the neck were acquired using MRA technique without and with intravenous contrast. Carotid stenosis measurements (when applicable) are obtained utilizing NASCET criteria, using the  distal internal carotid diameter as the denominator. CONTRAST:  64m GADAVIST GADOBUTROL 1 MMOL/ML IV SOLN COMPARISON:  No pertinent prior exam. FINDINGS: MR HEAD FINDINGS Brain: No acute infarct, mass effect or extra-axial collection. No acute or chronic hemorrhage. Normal white matter signal, parenchymal volume and CSF spaces. The midline structures are normal. Vascular: Major flow voids are preserved. Skull and upper cervical spine: Normal calvarium and skull base. Visualized upper cervical spine and soft tissues are normal. Sinuses/Orbits:No paranasal sinus fluid levels or advanced mucosal thickening. No mastoid or middle ear effusion. Normal orbits. MRA HEAD FINDINGS POSTERIOR CIRCULATION: --Vertebral arteries: Normal --Inferior cerebellar arteries: Normal. --Basilar artery: Normal. --Superior cerebellar arteries: Normal. --Posterior cerebral arteries: Normal. ANTERIOR CIRCULATION: --Intracranial internal carotid arteries: Normal. --Anterior cerebral arteries (ACA): Normal. --Middle cerebral arteries (MCA): Normal. ANATOMIC VARIANTS: None MRA NECK FINDINGS Aortic arch: Normal Right carotid system: Normal Left carotid system: Normal Vertebral arteries: Left dominant.  No occlusion. Other: None. IMPRESSION: 1. Normal MRI of the brain. 2. Normal MRA of the head and neck. Electronically  Signed   By: Ulyses Jarred M.D.   On: 04/06/2021 21:23   MR ANGIO NECK W WO CONTRAST  Result Date: 04/06/2021 CLINICAL DATA:  Neuro deficit, acute, stroke suspected. Right upper extremity numbness EXAM: MRI HEAD WITHOUT CONTRAST MRA HEAD WITHOUT CONTRAST MRA OF THE NECK WITHOUT AND WITH CONTRAST TECHNIQUE: Multiplanar, multi-echo pulse sequences of the brain and surrounding structures were acquired without intravenous contrast. Angiographic images of the Circle of Willis were acquired using MRA technique without intravenous contrast. Angiographic images of the neck were acquired using MRA technique without and with intravenous  contrast. Carotid stenosis measurements (when applicable) are obtained utilizing NASCET criteria, using the distal internal carotid diameter as the denominator. CONTRAST:  2m GADAVIST GADOBUTROL 1 MMOL/ML IV SOLN COMPARISON:  No pertinent prior exam. FINDINGS: MR HEAD FINDINGS Brain: No acute infarct, mass effect or extra-axial collection. No acute or chronic hemorrhage. Normal white matter signal, parenchymal volume and CSF spaces. The midline structures are normal. Vascular: Major flow voids are preserved. Skull and upper cervical spine: Normal calvarium and skull base. Visualized upper cervical spine and soft tissues are normal. Sinuses/Orbits:No paranasal sinus fluid levels or advanced mucosal thickening. No mastoid or middle ear effusion. Normal orbits. MRA HEAD FINDINGS POSTERIOR CIRCULATION: --Vertebral arteries: Normal --Inferior cerebellar arteries: Normal. --Basilar artery: Normal. --Superior cerebellar arteries: Normal. --Posterior cerebral arteries: Normal. ANTERIOR CIRCULATION: --Intracranial internal carotid arteries: Normal. --Anterior cerebral arteries (ACA): Normal. --Middle cerebral arteries (MCA): Normal. ANATOMIC VARIANTS: None MRA NECK FINDINGS Aortic arch: Normal Right carotid system: Normal Left carotid system: Normal Vertebral arteries: Left dominant.  No occlusion. Other: None. IMPRESSION: 1. Normal MRI of the brain. 2. Normal MRA of the head and neck. Electronically Signed   By: KUlyses JarredM.D.   On: 04/06/2021 21:23   MR BRAIN WO CONTRAST  Result Date: 04/06/2021 CLINICAL DATA:  Neuro deficit, acute, stroke suspected. Right upper extremity numbness EXAM: MRI HEAD WITHOUT CONTRAST MRA HEAD WITHOUT CONTRAST MRA OF THE NECK WITHOUT AND WITH CONTRAST TECHNIQUE: Multiplanar, multi-echo pulse sequences of the brain and surrounding structures were acquired without intravenous contrast. Angiographic images of the Circle of Willis were acquired using MRA technique without intravenous  contrast. Angiographic images of the neck were acquired using MRA technique without and with intravenous contrast. Carotid stenosis measurements (when applicable) are obtained utilizing NASCET criteria, using the distal internal carotid diameter as the denominator. CONTRAST:  638mGADAVIST GADOBUTROL 1 MMOL/ML IV SOLN COMPARISON:  No pertinent prior exam. FINDINGS: MR HEAD FINDINGS Brain: No acute infarct, mass effect or extra-axial collection. No acute or chronic hemorrhage. Normal white matter signal, parenchymal volume and CSF spaces. The midline structures are normal. Vascular: Major flow voids are preserved. Skull and upper cervical spine: Normal calvarium and skull base. Visualized upper cervical spine and soft tissues are normal. Sinuses/Orbits:No paranasal sinus fluid levels or advanced mucosal thickening. No mastoid or middle ear effusion. Normal orbits. MRA HEAD FINDINGS POSTERIOR CIRCULATION: --Vertebral arteries: Normal --Inferior cerebellar arteries: Normal. --Basilar artery: Normal. --Superior cerebellar arteries: Normal. --Posterior cerebral arteries: Normal. ANTERIOR CIRCULATION: --Intracranial internal carotid arteries: Normal. --Anterior cerebral arteries (ACA): Normal. --Middle cerebral arteries (MCA): Normal. ANATOMIC VARIANTS: None MRA NECK FINDINGS Aortic arch: Normal Right carotid system: Normal Left carotid system: Normal Vertebral arteries: Left dominant.  No occlusion. Other: None. IMPRESSION: 1. Normal MRI of the brain. 2. Normal MRA of the head and neck. Electronically Signed   By: KeUlyses Jarred.D.   On: 04/06/2021 21:23   DG CHEST PORT 1 VIEW  Result Date: 04/06/2021 CLINICAL DATA:  Dizziness with right-sided numbness and weakness. EXAM: PORTABLE CHEST 1 VIEW COMPARISON:  October 07, 2020 FINDINGS: The heart size and mediastinal contours are within normal limits. Both lungs are clear. The visualized skeletal structures are unremarkable. IMPRESSION: No active cardiopulmonary disease.  Electronically Signed   By: Virgina Norfolk M.D.   On: 04/06/2021 22:39   ECHOCARDIOGRAM COMPLETE BUBBLE STUDY  Result Date: 04/07/2021    ECHOCARDIOGRAM REPORT   Patient Name:   Dorothy Wood Date of Exam: 04/07/2021 Medical Rec #:  [id number]        Height:       65.0 in Accession #:    [id number]       Weight:       133.2 lb Date of Birth:  05-12-1961         BSA:          1.664 m Patient Age:    25 years         BP:           111/59 mmHg Patient Gender: F                HR:           77 bpm. Exam Location:  Inpatient Procedure: 2D Echo, Cardiac Doppler, Color Doppler and Saline Contrast Bubble            Study Indications:    TIA G45.9  History:        Patient has prior history of Echocardiogram examinations, most                 recent 04/30/2020. Risk Factors:Hypertension and Dyslipidemia.                 Cancer. Thyroid Disease.  Sonographer:    Jonelle Sidle Dance Referring Phys: [id number] [place]  1. Left ventricular ejection fraction, by estimation, is 55 to 60%. The left ventricle has normal function. The left ventricle has no regional wall motion abnormalities. Left ventricular diastolic parameters were normal.  2. Right ventricular systolic function is normal. The right ventricular size is normal. Tricuspid regurgitation signal is inadequate for assessing PA pressure.  3. The mitral valve is normal in structure. No evidence of mitral valve regurgitation. No evidence of mitral stenosis.  4. The aortic valve is tricuspid. Aortic valve regurgitation is not visualized. No aortic stenosis is present.  5. The inferior vena cava is normal in size with greater than 50% respiratory variability, suggesting right atrial pressure of 3 mmHg. FINDINGS  Left Ventricle: Left ventricular ejection fraction, by estimation, is 55 to 60%. The left ventricle has normal function. The left ventricle has no regional wall motion abnormalities. The left ventricular internal cavity size was normal in size. There  is  no left ventricular hypertrophy. Left ventricular diastolic parameters were normal. Right Ventricle: The right ventricular size is normal. No increase in right ventricular wall thickness. Right ventricular systolic function is normal. Tricuspid regurgitation signal is inadequate for assessing PA pressure. Left Atrium: Left atrial size was normal in size. Right Atrium: Right atrial size was normal in size. Pericardium: There is no evidence of pericardial effusion. Mitral Valve: The mitral valve is normal in structure. No evidence of mitral valve regurgitation. No evidence of mitral valve stenosis. Tricuspid Valve: The tricuspid valve is normal in structure. Tricuspid valve regurgitation is not demonstrated. Aortic Valve: The aortic valve is tricuspid. Aortic valve regurgitation is not visualized. No aortic stenosis is  present. Pulmonic Valve: The pulmonic valve was normal in structure. Pulmonic valve regurgitation is not visualized. Aorta: The aortic root is normal in size and structure. Venous: The inferior vena cava is normal in size with greater than 50% respiratory variability, suggesting right atrial pressure of 3 mmHg. IAS/Shunts: No atrial level shunt detected by color flow Doppler. Agitated saline contrast was given intravenously to evaluate for intracardiac shunting.  LEFT VENTRICLE PLAX 2D LVIDd:         3.90 cm  Diastology LVIDs:         2.60 cm  LV e' medial:    11.00 cm/s LV PW:         0.80 cm  LV E/e' medial:  7.5 LV IVS:        0.90 cm  LV e' lateral:   12.80 cm/s LVOT diam:     1.60 cm  LV E/e' lateral: 6.5 LV SV:         39 LV SV Index:   23 LVOT Area:     2.01 cm  RIGHT VENTRICLE            IVC RV Basal diam:  2.40 cm    IVC diam: 1.50 cm RV S prime:     9.57 cm/s TAPSE (M-mode): 1.8 cm LEFT ATRIUM             Index       RIGHT ATRIUM          Index LA diam:        3.00 cm 1.80 cm/m  RA Area:     8.71 cm LA Vol (A2C):   22.2 ml 13.34 ml/m RA Volume:   16.00 ml 9.61 ml/m LA Vol (A4C):    15.2 ml 9.13 ml/m LA Biplane Vol: 18.8 ml 11.30 ml/m  AORTIC VALVE LVOT Vmax:   85.00 cm/s LVOT Vmean:  61.500 cm/s LVOT VTI:    0.192 m  AORTA Ao Root diam: 3.30 cm Ao Asc diam:  3.10 cm MITRAL VALVE MV Area (PHT): 3.65 cm    SHUNTS MV Decel Time: 208 msec    Systemic VTI:  0.19 m MV E velocity: 82.60 cm/s  Systemic Diam: 1.60 cm MV A velocity: 71.70 cm/s MV E/A ratio:  1.15 Loralie Champagne MD Electronically signed by Loralie Champagne MD Signature Date/Time: 04/07/2021/4:47:48 PM    Final    CT HEAD CODE STROKE WO CONTRAST  Result Date: 04/06/2021 CLINICAL DATA:  Code stroke. Neuro deficit, acute, stroke suspected. Sudden onset of dizziness and right-sided numbness and weakness EXAM: CT HEAD WITHOUT CONTRAST TECHNIQUE: Contiguous axial images were obtained from the base of the skull through the vertex without intravenous contrast. COMPARISON:  Head CT 04/29/2020 and MRI 04/30/2020 FINDINGS: Brain: There is no evidence of an acute infarct, intracranial hemorrhage, mass, midline shift, or extra-axial fluid collection. The ventricles and sulci are normal. Vascular: No hyperdense vessel. Skull: No fracture or suspicious osseous lesion. Sinuses/Orbits: The visualized paranasal sinuses and mastoid air cells are clear. Unremarkable orbits. Other: None. ASPECTS [facility] Stroke Program Early CT Score) - Ganglionic level infarction (caudate, lentiform nuclei, internal capsule, insula, M1-M3 cortex): 7 - Supraganglionic infarction (M4-M6 cortex): 3 Total score (0-10 with 10 being normal): 10 IMPRESSION: 1. Negative head CT. 2. ASPECTS is 10. These r Esults were communicated to Dr. Quinn Axe at 6:57 pm on 04/06/2021 by text page via the [facility] messaging system. Electronically Signed   By: Logan Bores M.D.   On: 04/06/2021 18:57  Subjective: Feels that right-sided symptoms in the arm and leg are improving  Discharge Exam: Vitals:   04/07/21 1300 04/07/21 1400 04/07/21 1700 04/07/21 1706  BP: (!) 111/59 136/78 (!)  142/82   Pulse: 79 73 84   Resp: '11 10 16   '$ Temp:    98.6 F (37 C)  TempSrc:    Oral  SpO2: 100% 98% 99%   Weight:      Height:        General: Pt is alert, awake, not in acute distress Cardiovascular: RRR, S1/S2 +, no rubs, no gallops Respiratory: CTA bilaterally, no wheezing, no rhonchi Abdominal: Soft, NT, ND, bowel sounds + Extremities: no edema, no cyanosis    The results of significant diagnostics from this hospitalization (including imaging, microbiology, ancillary and laboratory) are listed below for reference.     Microbiology: Recent Results (from the past 240 hour(s))  Resp Panel by RT-PCR (Flu A&B, Covid) Nasopharyngeal Swab     Status: None   Collection Time: 04/06/21 10:00 PM   Specimen: Nasopharyngeal Swab; Nasopharyngeal(NP) swabs in vial transport medium  Result Value Ref Range Status   SARS Coronavirus 2 by RT PCR NEGATIVE NEGATIVE Final    Comment: (NOTE) SARS-CoV-2 target nucleic acids are NOT DETECTED.  The SARS-CoV-2 RNA is generally detectable in upper respiratory specimens during the acute phase of infection. The lowest concentration of SARS-CoV-2 viral copies this assay can detect is 138 copies/mL. A negative result does not preclude SARS-Cov-2 infection and should not be used as the sole basis for treatment or other patient management decisions. A negative result may occur with  improper specimen collection/handling, submission of specimen other than nasopharyngeal swab, presence of viral mutation(s) within the areas targeted by this assay, and inadequate number of viral copies(<138 copies/mL). A negative result must be combined with clinical observations, patient history, and epidemiological information. The expected result is Negative.  Fact Sheet for Patients:  EntrepreneurPulse.com.au  Fact Sheet for Healthcare Providers:  IncredibleEmployment.be  This test is no t yet approved or cleared by the  [place] FDA and  has been authorized for detection and/or diagnosis of SARS-CoV-2 by FDA under an Emergency Use Authorization (EUA). This EUA will remain  in effect (meaning this test can be used) for the duration of the COVID-19 declaration under Section 564(b)(1) of the Act, 21 U.S.C.section 360bbb-3(b)(1), unless the authorization is terminated  or revoked sooner.       Influenza A by PCR NEGATIVE NEGATIVE Final   Influenza B by PCR NEGATIVE NEGATIVE Final    Comment: (NOTE) The Xpert Xpress SARS-CoV-2/FLU/RSV plus assay is intended as an aid in the diagnosis of influenza from Nasopharyngeal swab specimens and should not be used as a sole basis for treatment. Nasal washings and aspirates are unacceptable for Xpert Xpress SARS-CoV-2/FLU/RSV testing.  Fact Sheet for Patients: EntrepreneurPulse.com.au  Fact Sheet for Healthcare Providers: IncredibleEmployment.be  This test is not yet approved or cleared by the [place] FDA and has been authorized for detection and/or diagnosis of SARS-CoV-2 by FDA under an Emergency Use Authorization (EUA). This EUA will remain in effect (meaning this test can be used) for the duration of the COVID-19 declaration under Section 564(b)(1) of the Act, 21 U.S.C. section 360bbb-3(b)(1), unless the authorization is terminated or revoked.  Performed at [facility], [place] [address] [place],  [address]      Labs: BNP (last 3 results) No results for input(s): BNP in the last 8760 hours. Basic Metabolic Panel:  Recent Labs  Lab 04/06/21 1842 04/06/21 1848  NA 134* 135  K 3.6 3.5  CL 99 100  CO2 24  --   GLUCOSE 88 87  BUN 17 18  CREATININE 0.81 0.70  CALCIUM 9.8  --    Liver Function Tests: Recent Labs  Lab 04/06/21 1842  AST 31  ALT 45*  ALKPHOS 93  BILITOT 0.7  PROT 7.1  ALBUMIN 4.4   No results for input(s): LIPASE, AMYLASE in the last 168 hours. No results for  input(s): AMMONIA in the last 168 hours. CBC: Recent Labs  Lab 04/06/21 1842 04/06/21 1848  WBC 8.7  --   NEUTROABS 4.9  --   HGB 13.4 13.9  HCT 39.7 41.0  MCV 91.7  --   PLT 276  --    Cardiac Enzymes: No results for input(s): CKTOTAL, CKMB, CKMBINDEX, TROPONINI in the last 168 hours. BNP: Invalid input(s): POCBNP CBG: Recent Labs  Lab 04/06/21 1842  GLUCAP 94   D-Dimer No results for input(s): DDIMER in the last 72 hours. Hgb A1c No results for input(s): HGBA1C in the last 72 hours. Lipid Profile Recent Labs    04/06/21 1909 04/07/21 0255  CHOL  --  195  HDL  --  69  LDLCALC  --  87  TRIG  --  193*  CHOLHDL  --  2.8  LDLDIRECT 122.2*  --    Thyroid function studies Recent Labs    04/07/21 0255  TSH 1.236   Anemia work up No results for input(s): VITAMINB12, FOLATE, FERRITIN, TIBC, IRON, RETICCTPCT in the last 72 hours. Urinalysis    Component Value Date/Time   COLORURINE YELLOW 03/18/2021 1115   APPEARANCEUR CLEAR 03/18/2021 1115   LABSPEC 1.012 03/18/2021 1115   PHURINE 5.5 03/18/2021 1115   GLUCOSEU NEGATIVE 03/18/2021 1115   HGBUR NEGATIVE 03/18/2021 1115   BILIRUBINUR NEGATIVE 02/02/2021 1158   BILIRUBINUR neg 03/23/2018 1431   KETONESUR NEGATIVE 03/18/2021 1115   PROTEINUR NEGATIVE 03/18/2021 1115   UROBILINOGEN 0.2 03/23/2018 1431   UROBILINOGEN 0.2 07/16/2014 1547   NITRITE NEGATIVE 03/18/2021 1115   LEUKOCYTESUR NEGATIVE 03/18/2021 1115   Sepsis Labs Invalid input(s): PROCALCITONIN,  WBC,  LACTICIDVEN Microbiology Recent Results (from the past 240 hour(s))  Resp Panel by RT-PCR (Flu A&B, Covid) Nasopharyngeal Swab     Status: None   Collection Time: 04/06/21 10:00 PM   Specimen: Nasopharyngeal Swab; Nasopharyngeal(NP) swabs in vial transport medium  Result Value Ref Range Status   SARS Coronavirus 2 by RT PCR NEGATIVE NEGATIVE Final    Comment: (NOTE) SARS-CoV-2 target nucleic acids are NOT DETECTED.  The SARS-CoV-2 RNA is  generally detectable in upper respiratory specimens during the acute phase of infection. The lowest concentration of SARS-CoV-2 viral copies this assay can detect is 138 copies/mL. A negative result does not preclude SARS-Cov-2 infection and should not be used as the sole basis for treatment or other patient management decisions. A negative result may occur with  improper specimen collection/handling, submission of specimen other than nasopharyngeal swab, presence of viral mutation(s) within the areas targeted by this assay, and inadequate number of viral copies(<138 copies/mL). A negative result must be combined with clinical observations, patient history, and epidemiological information. The expected result is Negative.  Fact Sheet for Patients:  EntrepreneurPulse.com.au  Fact Sheet for Healthcare Providers:  IncredibleEmployment.be  This test is no t yet approved or cleared by the [place] FDA and  has been authorized for detection and/or diagnosis of SARS-CoV-2  by FDA under an Emergency Use Authorization (EUA). This EUA will remain  in effect (meaning this test can be used) for the duration of the COVID-19 declaration under Section 564(b)(1) of the Act, 21 U.S.C.section 360bbb-3(b)(1), unless the authorization is terminated  or revoked sooner.       Influenza A by PCR NEGATIVE NEGATIVE Final   Influenza B by PCR NEGATIVE NEGATIVE Final    Comment: (NOTE) The Xpert Xpress SARS-CoV-2/FLU/RSV plus assay is intended as an aid in the diagnosis of influenza from Nasopharyngeal swab specimens and should not be used as a sole basis for treatment. Nasal washings and aspirates are unacceptable for Xpert Xpress SARS-CoV-2/FLU/RSV testing.  Fact Sheet for Patients: EntrepreneurPulse.com.au  Fact Sheet for Healthcare Providers: IncredibleEmployment.be  This test is not yet approved or cleared by the [place] FDA and has been authorized for detection and/or diagnosis of SARS-CoV-2 by FDA under an Emergency Use Authorization (EUA). This EUA will remain in effect (meaning this test can be used) for the duration of the COVID-19 declaration under Section 564(b)(1) of the Act, 21 U.S.C. section 360bbb-3(b)(1), unless the authorization is terminated or revoked.  Performed at [facility], [place] [address], [place], [place] [address]      Time coordinating discharge: 56mns  SIGNED:   JKathie Dike MD  [facility] 04/07/2021, 7:58 PM   If 7PM-7AM, please contact night-coverage [email or web address]

## 2021-04-07 NOTE — Evaluation (Signed)
Physical Therapy Evaluation and Discharge Patient Details Name: Dorothy Wood MRN: [id number] DOB: [birth year] Today's Date: 04/07/2021   History of Present Illness  Pt is a 60 y/o female admitted secondary to RLE Weakness and dizziness. MRI negative. Thought to be secondary to TIA. PMH includes Sjogren's syndrome, ADD, ovarian cancer and HTN.  Clinical Impression  Patient evaluated by Physical Therapy with no further acute PT needs identified. All education has been completed and the patient has no further questions. Pt overall steady with mobility tasks. Able to perform DGI tasks without LOB and at a low fall risk. Pt familiar with "BE FAST" acronym as she works as an Therapist, sports with [employer]. See below for any follow-up Physical Therapy or equipment needs. PT is signing off. Thank you for this referral. Please re-consult if needs change.      Follow Up Recommendations No PT follow up    Equipment Recommendations  None recommended by PT    Recommendations for Other Services       Precautions / Restrictions Precautions Precautions: None Restrictions Weight Bearing Restrictions: No      Mobility  Bed Mobility Overal bed mobility: Independent                  Transfers Overall transfer level: Independent                  Ambulation/Gait Ambulation/Gait assistance: Independent Gait Distance (Feet): 200 Feet Assistive device: None Gait Pattern/deviations: WFL(Within Functional Limits) Gait velocity: WFL   General Gait Details: No LOB noted when performing DGI tasks. Good gait speed.  Stairs            Wheelchair Mobility    Modified Rankin (Stroke Patients Only) Modified Rankin (Stroke Patients Only) Pre-Morbid Rankin Score: No symptoms Modified Rankin: No symptoms     Balance Overall balance assessment: Independent                               Standardized Balance Assessment Standardized Balance Assessment : Dynamic Gait Index    Dynamic Gait Index Level Surface: Normal Change in Gait Speed: Normal Gait with Horizontal Head Turns: Normal Gait with Vertical Head Turns: Normal Gait and Pivot Turn: Normal Step Over Obstacle: Normal Step Around Obstacles: Normal       Pertinent Vitals/Pain Pain Assessment: No/denies pain    Home Living Family/patient expects to be discharged to:: Private residence Living Arrangements: Spouse/significant other Available Help at Discharge: Family Type of Home: House Home Access: Level entry     Home Layout: One level Home Equipment: Shower seat - built in;Grab bars - toilet      Prior Function Level of Independence: Independent         Comments: Works as a Copy with [employer]     Hand Dominance        Extremity/Trunk Assessment   Upper Extremity Assessment Upper Extremity Assessment: Overall WFL for tasks assessed    Lower Extremity Assessment Lower Extremity Assessment: RLE deficits/detail RLE Deficits / Details: Reports RLE "heaviness" and grossly 4+/5 throughout.    Cervical / Trunk Assessment Cervical / Trunk Assessment: Normal  Communication   Communication: No difficulties  Cognition Arousal/Alertness: Awake/alert Behavior During Therapy: WFL for tasks assessed/performed Overall Cognitive Status: Within Functional Limits for tasks assessed  General Comments General comments (skin integrity, edema, etc.): Pt familiar with "BE FAST" acronym in recognizing CVA symptoms.    Exercises     Assessment/Plan    PT Assessment Patent does not need any further PT services  PT Problem List         PT Treatment Interventions      PT Goals (Current goals can be found in the Care Plan section)  Acute Rehab PT Goals Patient Stated Goal: to go home PT Goal Formulation: With patient Time For Goal Achievement: 04/07/21 Potential to Achieve Goals: Good    Frequency     Barriers to  discharge        Co-evaluation               AM-PAC PT "6 Clicks" Mobility  Outcome Measure Help needed turning from your back to your side while in a flat bed without using bedrails?: None Help needed moving from lying on your back to sitting on the side of a flat bed without using bedrails?: None Help needed moving to and from a bed to a chair (including a wheelchair)?: None Help needed standing up from a chair using your arms (e.g., wheelchair or bedside chair)?: None Help needed to walk in hospital room?: None Help needed climbing 3-5 steps with a railing? : None 6 Click Score: 24    End of Session Equipment Utilized During Treatment: Gait belt Activity Tolerance: Patient tolerated treatment well Patient left: in bed;with call bell/phone within reach (on stretcher in ED) Nurse Communication: Mobility status PT Visit Diagnosis: Other symptoms and signs involving the nervous system [id number])    [id number] PT Time Calculation (min) (ACUTE ONLY): 13 min   Charges:   PT Evaluation $PT Eval Low Complexity: 1 Low          Lou Miner, DPT  Acute Rehabilitation Services  Pager: [phone] Office: [phone]   Rudean Hitt 04/07/2021, 9:24 AM

## 2021-04-07 NOTE — Progress Notes (Signed)
SLP Cancellation Note  Patient Details Name: ZAMARIYA BEEGHLY MRN: [id number] DOB: 22-Jan-1961   Cancelled treatment:       Reason Eval/Treat Not Completed: SLP screened, no needs identified per discussion with PT and chart review. Will sign off.  Ilario Dhaliwal L. Tivis Ringer, [place] CCC/SLP Acute Rehabilitation Services Office number [phone] Pager [phone]    Juan Quam Laurice 04/07/2021, 11:08 AM

## 2021-04-07 NOTE — Progress Notes (Signed)
OT Cancellation Note  Patient Details Name: Dorothy Wood MRN: [id number] DOB: 03-19-1961   Cancelled Treatment:    Reason Eval/Treat Not Completed: OT screened, no needs identified, will sign off Per discussion with PT, no skilled OT needs required at this time.   Jefferey Pica, OTR/L Acute Rehabilitation Services Pager: [phone] Office: [place] 04/07/2021, 9:24 AM

## 2021-04-07 NOTE — TOC CAGE-AID Note (Signed)
Transition of Care [facility]) - CAGE-AID Screening   Patient Details  Name: Dorothy Wood MRN: [id number] Date of Birth: December 27, 1960  Transition of Care [facility]) CM/SW Contact:    Korrine Sicard C Tarpley-Carter, [place] Phone Number: 04/07/2021, 11:56 AM   Clinical Narrative: Pt is unable to participate in Cage Aid.   Abdimalik Mayorquin Tarpley-Carter, MSW, LCSW-A Pronouns:  She/Her/Hers [facility] Health[facility]itions of Care Clinical Social Worker Direct Number:  [phone] [email or web address]   CAGE-AID Screening: Substance Abuse Screening unable to be completed due to: : Patient unable to participate

## 2021-04-07 NOTE — Progress Notes (Addendum)
STROKE TEAM PROGRESS NOTE   ATTENDING NOTE: I reviewed above note and agree with the assessment and plan. Pt was seen and examined.   60 F with PMH of HTN, HLD, alcohol abuse, Sjogren's disease, ovarian cancer admitted for right arm and leg numbness and heaviness as well as right face tightness. Symptoms has much improved now but still feel right leg heaviness. CT, MRI and MRA head and neck negative. LDL 87 and A1C pending. TTE 55-60%. UDS positive for benzo.   On exam, pt neuro intact, except RLE proximal muscle strength lack of effort on exam, distal RLE muscle strength comparable with left. Etiology for pt symptoms concerning for anxiety related as pt stated that her husband has pancreatic cancer which caused a lot stress on her. Of course, TIA is still in DDx given her risk factors. Recommend DAPT for 3 weeks and then ASA alone. Increased lipitor from 40 to 80. PT/OT no recs. Discussed with pt about stress coping skills.  For detailed assessment and plan, please refer to above as I have made changes wherever appropriate.   Neurology will sign off. Please call with questions. Pt will follow up with stroke clinic NP at [facility] in about 4 weeks. Thanks for the consult.   Rosalin Hawking, MD PhD Stroke Neurology 04/07/2021 2:34 PM    INTERVAL HISTORY Pt seen in Ed, echocardiogram being done. Patient is asymptomatic at present.   Vitals:   04/07/21 0600 04/07/21 0700 04/07/21 0900 04/07/21 1209  BP: 133/83 125/64 (!) 99/52 123/75  Pulse: 67 76 70 76  Resp: (!) '23 14 13 17  '$ Temp:      TempSrc:      SpO2: 98% 100% 97% 97%  Weight:      Height:       CBC:  Recent Labs  Lab 04/06/21 1842 04/06/21 1848  WBC 8.7  --   NEUTROABS 4.9  --   HGB 13.4 13.9  HCT 39.7 41.0  MCV 91.7  --   PLT 276  --    Basic Metabolic Panel:  Recent Labs  Lab 04/06/21 1842 04/06/21 1848  NA 134* 135  K 3.6 3.5  CL 99 100  CO2 24  --   GLUCOSE 88 87  BUN 17 18  CREATININE 0.81 0.70  CALCIUM 9.8  --      Lipid Panel:  Recent Labs  Lab 04/07/21 0255  CHOL 195  TRIG 193*  HDL 69  CHOLHDL 2.8  VLDL 39  LDLCALC 87    HgbA1c: No results for input(s): HGBA1C in the last 168 hours. Urine Drug Screen: No results for input(s): LABOPIA, COCAINSCRNUR, LABBENZ, AMPHETMU, THCU, LABBARB in the last 168 hours.  Alcohol Level No results for input(s): ETH in the last 168 hours.  IMAGING past 24 hours MR ANGIO HEAD WO CONTRAST  Result Date: 04/06/2021 CLINICAL DATA:  Neuro deficit, acute, stroke suspected. Right upper extremity numbness EXAM: MRI HEAD WITHOUT CONTRAST MRA HEAD WITHOUT CONTRAST MRA OF THE NECK WITHOUT AND WITH CONTRAST TECHNIQUE: Multiplanar, multi-echo pulse sequences of the brain and surrounding structures were acquired without intravenous contrast. Angiographic images of the Circle of Willis were acquired using MRA technique without intravenous contrast. Angiographic images of the neck were acquired using MRA technique without and with intravenous contrast. Carotid stenosis measurements (when applicable) are obtained utilizing NASCET criteria, using the distal internal carotid diameter as the denominator. CONTRAST:  66m GADAVIST GADOBUTROL 1 MMOL/ML IV SOLN COMPARISON:  No pertinent prior exam. FINDINGS: MR HEAD  FINDINGS Brain: No acute infarct, mass effect or extra-axial collection. No acute or chronic hemorrhage. Normal white matter signal, parenchymal volume and CSF spaces. The midline structures are normal. Vascular: Major flow voids are preserved. Skull and upper cervical spine: Normal calvarium and skull base. Visualized upper cervical spine and soft tissues are normal. Sinuses/Orbits:No paranasal sinus fluid levels or advanced mucosal thickening. No mastoid or middle ear effusion. Normal orbits. MRA HEAD FINDINGS POSTERIOR CIRCULATION: --Vertebral arteries: Normal --Inferior cerebellar arteries: Normal. --Basilar artery: Normal. --Superior cerebellar arteries: Normal. --Posterior  cerebral arteries: Normal. ANTERIOR CIRCULATION: --Intracranial internal carotid arteries: Normal. --Anterior cerebral arteries (ACA): Normal. --Middle cerebral arteries (MCA): Normal. ANATOMIC VARIANTS: None MRA NECK FINDINGS Aortic arch: Normal Right carotid system: Normal Left carotid system: Normal Vertebral arteries: Left dominant.  No occlusion. Other: None. IMPRESSION: 1. Normal MRI of the brain. 2. Normal MRA of the head and neck. Electronically Signed   By: Ulyses Jarred M.D.   On: 04/06/2021 21:23   MR ANGIO NECK W WO CONTRAST  Result Date: 04/06/2021 CLINICAL DATA:  Neuro deficit, acute, stroke suspected. Right upper extremity numbness EXAM: MRI HEAD WITHOUT CONTRAST MRA HEAD WITHOUT CONTRAST MRA OF THE NECK WITHOUT AND WITH CONTRAST TECHNIQUE: Multiplanar, multi-echo pulse sequences of the brain and surrounding structures were acquired without intravenous contrast. Angiographic images of the Circle of Willis were acquired using MRA technique without intravenous contrast. Angiographic images of the neck were acquired using MRA technique without and with intravenous contrast. Carotid stenosis measurements (when applicable) are obtained utilizing NASCET criteria, using the distal internal carotid diameter as the denominator. CONTRAST:  24m GADAVIST GADOBUTROL 1 MMOL/ML IV SOLN COMPARISON:  No pertinent prior exam. FINDINGS: MR HEAD FINDINGS Brain: No acute infarct, mass effect or extra-axial collection. No acute or chronic hemorrhage. Normal white matter signal, parenchymal volume and CSF spaces. The midline structures are normal. Vascular: Major flow voids are preserved. Skull and upper cervical spine: Normal calvarium and skull base. Visualized upper cervical spine and soft tissues are normal. Sinuses/Orbits:No paranasal sinus fluid levels or advanced mucosal thickening. No mastoid or middle ear effusion. Normal orbits. MRA HEAD FINDINGS POSTERIOR CIRCULATION: --Vertebral arteries: Normal --Inferior  cerebellar arteries: Normal. --Basilar artery: Normal. --Superior cerebellar arteries: Normal. --Posterior cerebral arteries: Normal. ANTERIOR CIRCULATION: --Intracranial internal carotid arteries: Normal. --Anterior cerebral arteries (ACA): Normal. --Middle cerebral arteries (MCA): Normal. ANATOMIC VARIANTS: None MRA NECK FINDINGS Aortic arch: Normal Right carotid system: Normal Left carotid system: Normal Vertebral arteries: Left dominant.  No occlusion. Other: None. IMPRESSION: 1. Normal MRI of the brain. 2. Normal MRA of the head and neck. Electronically Signed   By: KUlyses JarredM.D.   On: 04/06/2021 21:23   MR BRAIN WO CONTRAST  Result Date: 04/06/2021 CLINICAL DATA:  Neuro deficit, acute, stroke suspected. Right upper extremity numbness EXAM: MRI HEAD WITHOUT CONTRAST MRA HEAD WITHOUT CONTRAST MRA OF THE NECK WITHOUT AND WITH CONTRAST TECHNIQUE: Multiplanar, multi-echo pulse sequences of the brain and surrounding structures were acquired without intravenous contrast. Angiographic images of the Circle of Willis were acquired using MRA technique without intravenous contrast. Angiographic images of the neck were acquired using MRA technique without and with intravenous contrast. Carotid stenosis measurements (when applicable) are obtained utilizing NASCET criteria, using the distal internal carotid diameter as the denominator. CONTRAST:  661mGADAVIST GADOBUTROL 1 MMOL/ML IV SOLN COMPARISON:  No pertinent prior exam. FINDINGS: MR HEAD FINDINGS Brain: No acute infarct, mass effect or extra-axial collection. No acute or chronic hemorrhage. Normal white matter signal, parenchymal  volume and CSF spaces. The midline structures are normal. Vascular: Major flow voids are preserved. Skull and upper cervical spine: Normal calvarium and skull base. Visualized upper cervical spine and soft tissues are normal. Sinuses/Orbits:No paranasal sinus fluid levels or advanced mucosal thickening. No mastoid or middle ear  effusion. Normal orbits. MRA HEAD FINDINGS POSTERIOR CIRCULATION: --Vertebral arteries: Normal --Inferior cerebellar arteries: Normal. --Basilar artery: Normal. --Superior cerebellar arteries: Normal. --Posterior cerebral arteries: Normal. ANTERIOR CIRCULATION: --Intracranial internal carotid arteries: Normal. --Anterior cerebral arteries (ACA): Normal. --Middle cerebral arteries (MCA): Normal. ANATOMIC VARIANTS: None MRA NECK FINDINGS Aortic arch: Normal Right carotid system: Normal Left carotid system: Normal Vertebral arteries: Left dominant.  No occlusion. Other: None. IMPRESSION: 1. Normal MRI of the brain. 2. Normal MRA of the head and neck. Electronically Signed   By: Ulyses Jarred M.D.   On: 04/06/2021 21:23   DG CHEST PORT 1 VIEW  Result Date: 04/06/2021 CLINICAL DATA:  Dizziness with right-sided numbness and weakness. EXAM: PORTABLE CHEST 1 VIEW COMPARISON:  October 07, 2020 FINDINGS: The heart size and mediastinal contours are within normal limits. Both lungs are clear. The visualized skeletal structures are unremarkable. IMPRESSION: No active cardiopulmonary disease. Electronically Signed   By: Virgina Norfolk M.D.   On: 04/06/2021 22:39   CT HEAD CODE STROKE WO CONTRAST  Result Date: 04/06/2021 CLINICAL DATA:  Code stroke. Neuro deficit, acute, stroke suspected. Sudden onset of dizziness and right-sided numbness and weakness EXAM: CT HEAD WITHOUT CONTRAST TECHNIQUE: Contiguous axial images were obtained from the base of the skull through the vertex without intravenous contrast. COMPARISON:  Head CT 04/29/2020 and MRI 04/30/2020 FINDINGS: Brain: There is no evidence of an acute infarct, intracranial hemorrhage, mass, midline shift, or extra-axial fluid collection. The ventricles and sulci are normal. Vascular: No hyperdense vessel. Skull: No fracture or suspicious osseous lesion. Sinuses/Orbits: The visualized paranasal sinuses and mastoid air cells are clear. Unremarkable orbits. Other: None.  ASPECTS [facility] Stroke Program Early CT Score) - Ganglionic level infarction (caudate, lentiform nuclei, internal capsule, insula, M1-M3 cortex): 7 - Supraganglionic infarction (M4-M6 cortex): 3 Total score (0-10 with 10 being normal): 10 IMPRESSION: 1. Negative head CT. 2. ASPECTS is 10. These r Esults were communicated to Dr. Quinn Axe at 6:57 pm on 04/06/2021 by text page via the [facility] messaging system. Electronically Signed   By: Logan Bores M.D.   On: 04/06/2021 18:57    PHYSICAL EXAM  Neuro: *MS: A&O x4. Follows multi-step commands. *Speech: fluid, nondysarthric, able to name and repeat *CN:   I: Deferred   II,III: PERRLA, VFF by confrontation, optic discs not visualized 2/2 pupillary constriction   III,IV,VI: EOMI w/o nystagmus, no ptosis   V: Sensation intact from V1 to V3 to LT   VII: Eyelid closure was full.  R NLF flattening   VIII: Hearing intact to voice   IX,X: Voice normal, palate elevates symmetrically   XI: SCM/trap 5/5 bilat   XII: Tongue protrudes midline, no atrophy or fasciculations   *Motor:   Normal bulk.  No tremor, rigidity or bradykinesia. No pronator drift.     Strength 5/5 throughout.   *Sensory: Intact to light touch, pinprick, temperature vibration throughout. Symmetric. Propioception intact bilat.  No double-simultaneous extinction. *Coordination:  Finger-to-nose, heel-to-shin, rapid alternating motions were intact. *Reflexes:  2+ and symmetric throughout without clonus; toes down-going bilat *Gait: deferred  ASSESSMENT/PLAN [name] is a 60 y.o. female with history of  hx Sjogren's syndrome, hypothyroidism, hyperlipidemia, GERD, EtOH abuse, HTN who presented  as stroke code for heaviness and numbness in her RUE that had acute onset 1730 today. Exam improved in triage and by my examination her only deficit was R NLF flattening. She still had some subjective paresthesias in RUE as well. CT head showed NAICP. tPA was not administered 2/2 NIHSS = 1. CTA  not performed 2/2 exam not c/w LVO.    TIA:    Code Stroke   CT head No acute abnormality.   ASPECTS 10.       MRI Brain: no acute intracranial abnormalities MRA  normal MRA head and neck   2D Echo currently in progress   LDL 87 HgbA1c 5.7 VTE prophylaxis -      Diet   Diet Heart Room service appropriate? Yes; Fluid consistency: Thin   aspirin 81 mg daily prior to admission, now on aspirin 81 mg daily and clopidogrel 75 mg daily.   Therapy recommendations:  not yet seen Disposition:   unclear  Hypertension Home meds:  hctz '25mg'$  daily  Stable Permissive hypertension (OK if < 220/120) but gradually normalize in 5-7 days Long-term BP goal normotensive  Hyperlipidemia Home meds:  lipitor '40mg'$  daily,  resumed in hospital LDL 87, goal < 70 High intensity statin increased lipitor to '80mg'$  daily Continue statin at discharge  HgbA1c 5.7, goal < 7.0 CBGs Recent Labs    04/06/21 1842  GLUCAP 94    SSI  Other Stroke Risk Factors  Family hx stroke ( )  Coronary artery disease   Other Active Problems Sjogren's syndrome: chronic stable home meds Hypothyroidism: continue with home meds Essential hypertension:  ADD: continue home meds   Hospital day # 0    To contact Stroke Continuity provider, please refer to [email or web address] After hours, contact General Neurology

## 2021-04-07 NOTE — Progress Notes (Signed)
  Echocardiogram 2D Echocardiogram has been performed.  Randa Lynn Aragorn Recker 04/07/2021, 1:43 PM

## 2021-04-08 ENCOUNTER — Other Ambulatory Visit (HOSPITAL_COMMUNITY): Payer: Self-pay

## 2021-04-08 ENCOUNTER — Other Ambulatory Visit: Payer: Self-pay

## 2021-04-09 ENCOUNTER — Other Ambulatory Visit (HOSPITAL_COMMUNITY): Payer: Self-pay

## 2021-04-09 ENCOUNTER — Telehealth: Payer: Self-pay

## 2021-04-09 MED ORDER — BUPROPION HCL ER (XL) 300 MG PO TB24
ORAL_TABLET | ORAL | 3 refills | Status: DC
  Filled 2021-04-09: qty 30, 30d supply, fill #0
  Filled 2021-05-08: qty 30, 30d supply, fill #1
  Filled 2021-06-14: qty 30, 30d supply, fill #2
  Filled 2021-07-19: qty 30, 30d supply, fill #3

## 2021-04-09 NOTE — Telephone Encounter (Signed)
Transition Care Management Follow-up Telephone Call Date of discharge and from where: 04/07/21 [place] How have you been since you were released from the hospital? no Any questions or concerns? No  Items Reviewed: Did the pt receive and understand the discharge instructions provided? Yes  Medications obtained and verified? Yes  Other? No  Any new allergies since your discharge? No  Dietary orders reviewed? No Do you have support at home? Yes    Functional Questionnaire: (I = Independent and D = Dependent) ADLs: I  Bathing/Dressing- I  Meal Prep- I  Eating- I  Maintaining continence- I  Transferring/Ambulation- I  Managing Meds- I  Follow up appointments reviewed:  PCP Hospital f/u appt confirmed? Yes  Scheduled to see Dr. Redmond School on 04/21/21 @ 4:15. [place] [facility] f/u appt confirmed? Yes  Scheduled to see Frann Rider on 05/21/21 @ 9:15. Are transportation arrangements needed? No  If their condition worsens, is the pt aware to call PCP or go to the Emergency Dept.? Yes Was the patient provided with contact information for the PCP's office or ED? Yes Was to pt encouraged to call back with questions or concerns? Yes

## 2021-04-15 ENCOUNTER — Encounter: Payer: Self-pay | Admitting: Rheumatology

## 2021-04-15 ENCOUNTER — Ambulatory Visit: Payer: 59 | Admitting: Rheumatology

## 2021-04-15 ENCOUNTER — Other Ambulatory Visit: Payer: Self-pay

## 2021-04-15 VITALS — BP 115/68 | HR 81 | Ht 65.0 in | Wt 131.0 lb

## 2021-04-15 DIAGNOSIS — M19042 Primary osteoarthritis, left hand: Secondary | ICD-10-CM

## 2021-04-15 DIAGNOSIS — I10 Essential (primary) hypertension: Secondary | ICD-10-CM

## 2021-04-15 DIAGNOSIS — G4733 Obstructive sleep apnea (adult) (pediatric): Secondary | ICD-10-CM

## 2021-04-15 DIAGNOSIS — G459 Transient cerebral ischemic attack, unspecified: Secondary | ICD-10-CM

## 2021-04-15 DIAGNOSIS — Z8639 Personal history of other endocrine, nutritional and metabolic disease: Secondary | ICD-10-CM

## 2021-04-15 DIAGNOSIS — M3500 Sicca syndrome, unspecified: Secondary | ICD-10-CM | POA: Diagnosis not present

## 2021-04-15 DIAGNOSIS — R748 Abnormal levels of other serum enzymes: Secondary | ICD-10-CM

## 2021-04-15 DIAGNOSIS — R7989 Other specified abnormal findings of blood chemistry: Secondary | ICD-10-CM

## 2021-04-15 DIAGNOSIS — M5030 Other cervical disc degeneration, unspecified cervical region: Secondary | ICD-10-CM

## 2021-04-15 DIAGNOSIS — M19041 Primary osteoarthritis, right hand: Secondary | ICD-10-CM

## 2021-04-15 DIAGNOSIS — E785 Hyperlipidemia, unspecified: Secondary | ICD-10-CM

## 2021-04-15 DIAGNOSIS — R9431 Abnormal electrocardiogram [ECG] [EKG]: Secondary | ICD-10-CM | POA: Diagnosis not present

## 2021-04-15 DIAGNOSIS — F909 Attention-deficit hyperactivity disorder, unspecified type: Secondary | ICD-10-CM

## 2021-04-15 DIAGNOSIS — R5383 Other fatigue: Secondary | ICD-10-CM | POA: Diagnosis not present

## 2021-04-15 DIAGNOSIS — Z86010 Personal history of colonic polyps: Secondary | ICD-10-CM

## 2021-04-15 DIAGNOSIS — Z8543 Personal history of malignant neoplasm of ovary: Secondary | ICD-10-CM

## 2021-04-18 LAB — ALDOLASE: Aldolase: 4.3 U/L (ref <=8.1)

## 2021-04-18 LAB — CK ISOENZYMES
CK-BB: NOT DETECTED % of total
CK-MB: 0 % of total (ref <5)
CK-MM: 100 % of total (ref 95–100)
Total CK: 171 U/L — ABNORMAL HIGH (ref 29–143)

## 2021-04-19 NOTE — Progress Notes (Signed)
CK is mildly elevated and improved.  Please notify patient that CK was 100% of muscle origin.  Aldolase which is another muscle enzyme is within normal limits.

## 2021-04-21 ENCOUNTER — Other Ambulatory Visit: Payer: Self-pay

## 2021-04-21 ENCOUNTER — Ambulatory Visit: Payer: 59 | Admitting: Family Medicine

## 2021-04-21 ENCOUNTER — Encounter: Payer: Self-pay | Admitting: Family Medicine

## 2021-04-21 VITALS — BP 104/60 | HR 80 | Temp 98.6°F | Wt 133.4 lb

## 2021-04-21 DIAGNOSIS — G459 Transient cerebral ischemic attack, unspecified: Secondary | ICD-10-CM | POA: Diagnosis not present

## 2021-04-21 DIAGNOSIS — M3500 Sicca syndrome, unspecified: Secondary | ICD-10-CM

## 2021-04-21 NOTE — Progress Notes (Signed)
   Subjective:    Patient ID: Dorothy Wood, female    DOB: Jun 28, 1961, 60 y.o.   MRN: [id number]  HPI She is here for follow-up.  She was seen in the emergency room and evaluated for neurologic issue and found to have TIA which she has had in the past.  The medical record was reviewed.  She did get an extensive work-up including MRI.  She is scheduled to see neurology for this.  Presently she is on Plavix.  No present headache, blurred vision, double vision, numbness, tingling or weakness.  She was also seen recently by rheumatology who apparently verified the diagnosis of Sicca syndrome   Review of Systems     Objective:   Physical Exam Alert and in no distress.  EOMI.  Other cranial nerves grossly intact.  Speech is normal.       Assessment & Plan:  Sjogren's syndrome, with unspecified organ involvement ([place])  TIA (transient ischemic attack) She will follow-up with neurology.  Continue to see rheumatology.  Follow-up.  For routine care.

## 2021-04-23 ENCOUNTER — Encounter: Payer: Self-pay | Admitting: Adult Health

## 2021-04-23 ENCOUNTER — Other Ambulatory Visit (HOSPITAL_COMMUNITY): Payer: Self-pay

## 2021-04-23 MED ORDER — MYRBETRIQ 50 MG PO TB24
50.0000 mg | ORAL_TABLET | Freq: Every day | ORAL | 1 refills | Status: DC
  Filled 2021-04-23: qty 30, 30d supply, fill #0
  Filled 2021-05-21 – 2021-06-11 (×4): qty 30, 30d supply, fill #1

## 2021-04-24 ENCOUNTER — Other Ambulatory Visit (HOSPITAL_COMMUNITY): Payer: Self-pay

## 2021-04-24 MED ORDER — LORAZEPAM 1 MG PO TABS
ORAL_TABLET | ORAL | 1 refills | Status: DC
  Filled 2021-04-24: qty 15, 30d supply, fill #0
  Filled 2021-05-21: qty 15, 30d supply, fill #1

## 2021-05-06 ENCOUNTER — Encounter: Payer: Self-pay | Admitting: Family Medicine

## 2021-05-06 ENCOUNTER — Encounter: Payer: Self-pay | Admitting: Physical Therapy

## 2021-05-07 ENCOUNTER — Encounter: Payer: Self-pay | Admitting: Family Medicine

## 2021-05-07 ENCOUNTER — Ambulatory Visit: Payer: 59 | Admitting: Family Medicine

## 2021-05-07 ENCOUNTER — Other Ambulatory Visit: Payer: Self-pay

## 2021-05-07 VITALS — BP 98/60 | HR 82 | Temp 98.1°F | Wt 131.6 lb

## 2021-05-07 DIAGNOSIS — M7582 Other shoulder lesions, left shoulder: Secondary | ICD-10-CM

## 2021-05-07 NOTE — Progress Notes (Signed)
   Subjective:    Patient ID: Dorothy Wood, female    DOB: June 02, 1961, 60 y.o.   MRN: [id number]  HPI Approximately 2 weeks ago while doing shoulder exercises 3 was in an abducted and externally rotated position with her left shoulder and felt some pain in it.  Since then she has noted some discomfort especially when she abducts and externally rotates.   Review of Systems     Objective:   Physical Exam Left shoulder exam shows no palpable tenderness over the [facility] joint or over the bicipital groove.  Negative sulcus sign.  Drop arm test was negative however Neer's and Hawkins test was uncomfortable.  Supraspinatus and infraspinatus and teres minor was all normal.       Assessment & Plan:   Tendinitis of left rotator cuff Recommend conservative care with 2 Aleve twice per day for the next 10 to 14 days and if continued difficulty, then I will refer her for physical therapy.  She was comfortable with that.

## 2021-05-09 ENCOUNTER — Other Ambulatory Visit (HOSPITAL_COMMUNITY): Payer: Self-pay

## 2021-05-21 ENCOUNTER — Encounter: Payer: Self-pay | Admitting: Adult Health

## 2021-05-21 ENCOUNTER — Ambulatory Visit: Payer: 59 | Admitting: Adult Health

## 2021-05-21 ENCOUNTER — Other Ambulatory Visit (HOSPITAL_COMMUNITY): Payer: Self-pay

## 2021-05-21 ENCOUNTER — Other Ambulatory Visit: Payer: Self-pay

## 2021-05-21 VITALS — BP 144/78 | HR 71 | Ht 65.0 in | Wt 130.0 lb

## 2021-05-21 DIAGNOSIS — E785 Hyperlipidemia, unspecified: Secondary | ICD-10-CM

## 2021-05-21 DIAGNOSIS — G459 Transient cerebral ischemic attack, unspecified: Secondary | ICD-10-CM

## 2021-05-21 DIAGNOSIS — I10 Essential (primary) hypertension: Secondary | ICD-10-CM

## 2021-05-21 MED ORDER — LEVOTHYROXINE SODIUM 88 MCG PO TABS
88.0000 ug | ORAL_TABLET | Freq: Every day | ORAL | 3 refills | Status: DC
  Filled 2021-05-21: qty 90, 90d supply, fill #0
  Filled 2021-09-13: qty 90, 90d supply, fill #1

## 2021-05-21 MED FILL — Hydrochlorothiazide Tab 25 MG: ORAL | 90 days supply | Qty: 90 | Fill #1 | Status: AC

## 2021-05-21 NOTE — Patient Instructions (Signed)
Continue aspirin 81 mg daily  and atorvastatin 80 mg daily for secondary stroke prevention  Continue to follow up with PCP regarding cholesterol and blood pressure management  Maintain strict control of hypertension with blood pressure goal below 130/90 and cholesterol with LDL cholesterol (bad cholesterol) goal below 70 mg/dL.       Followup in the future with me in 6 months or call earlier if needed       Thank you for coming to see [place] at [facility]. I hope we have been able to provide you high quality care today.  You may receive a patient satisfaction survey over the next few weeks. We would appreciate your feedback and comments so that we may continue to improve ourselves and the health of our patients.

## 2021-05-21 NOTE — Progress Notes (Signed)
[name] [address] [name] [address] [phone]       HOSPITAL FOLLOW UP NOTE  Ms. Dorothy YETT Date of Birth:  Nov 17, 1960 Medical Record Number:  [id number]   Reason for Referral: Hospital follow-up    SUBJECTIVE:   CHIEF COMPLAINT:  Chief Complaint  Patient presents with   Transient Ischemic Attack    Rm 3, hospital FU  "doing well"     HPI:   [name] is a 60 y.o. who returns for recent hospital follow-up unaccompanied.  She was previously seen in office 11/25/2020 for TIA follow-up and discharged from clinic as doing well and routinely follow-up with PCP.  She presented to the ED on 04/06/2021 with right arm and leg numbness and heaviness as well as right facial tightness.  CT, MRI and MRA head/neck unremarkable.  EF 55 to 60%.  LDL 87.  A1c 5.7.  UDS positive for benzo. Per Dr. Erlinda Hong, pt neuro intact, except RLE proximal muscle strength lack of effort on exam, distal RLE muscle strength comparable with left. Etiology for pt symptoms concerning for anxiety related as pt stated that her husband has pancreatic cancer which caused a lot stress on her. Of course, TIA is still in DDx given her risk factors. Recommend DAPT for 3 weeks and then ASA alone. Increased lipitor from 40 to 80. PT/OT no recs.   Overall doing well since discharge.  Denies new or reoccurring stroke/TIA symptoms.  Completed 3 weeks DAPT -remains on aspirin 81 mg daily and atorvastatin 80 mg daily.  Blood pressure today 144/78. (Just took HCTZ prior to appt).  Routinely monitors at home and typically stable.  She has been trying to manage stress levels in regards to her husband's pancreatic cancer with recent surgery at [facility] and starting new type of chemo.  No further concerns at this time.      History provided for reference purposes only Update 11/25/2020 JM: [name] returns for 74-monthTIA follow-up unaccompanied  Doing well since prior visit without new or  recurring stroke/TIA symptoms Compliant on aspirin 81 mg daily and atorvastatin 40 mg daily -denies side effects Blood pressure today 117/70  Lipid panel 07/2020 LDL 76  No concerns at this time  Initial visit 05/28/2020 JM: Ms. 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 MYULITZA AFZALIis a 60y.o. female with history of HTN, HLD, Sjogren's disease, hypothyroidism who presented on 04/29/2020 with sudden onset of dizziness followed by numbness left leg and shortly after the left arm as well lasting approximately 1 hour.   Stroke work-up completed with possible right brain TIA vs anxiety (significant stress due to recent diagnosis of recurrent cancer in her husband).  Due to possible TIA, recommended aspirin and Plavix for 3 weeks followed by aspirin alone.  Hx of HTN stable.  LDL 110 initiate atorvastatin 40 mg daily.  No history or evidence of DM with A1c 5.6.  Other stroke risk factors include EtOH use but no prior stroke history.  Evaluated by therapy and was discharged home in stable  condition without therapy needs.  R brain TIA versus anxiety Code Stroke CT head No acute abnormality.  ASPECTS 10.    CTA head R V4 diminuative. L PCA high-grade P2/P3 jxn. B PCA atherosclerosis. Mild atherosclerosis B M2 CTA neck Unremarkable, streak artifact from recent OR cervical  MRI  / MRA  Unremarkable  2D Echo EF 60-65%. No source of embolus  LDL 110 HgbA1c 5.6 VTE prophylaxis - Lovenox 40 mg sq daily  No antithrombotic prior to admission,  now on aspirin 325 mg daily. Decrease aspirin to 81 and add plavix 75 x 3 weeks then aspirin alone.  Therapy recommendations:  No therapy needs Disposition:  Return home      ROS:   14 system review of systems performed and negative with exception of no complaints  PMH:  Past Medical History:  Diagnosis Date   Cancer ([place])    OVARIAN   Colonic polyp    Gastritis    Hyperlipidemia    Hypertension    Sjogren's disease ([place])    Stroke ([place])    Thyroid disease    HYPOTHYROIDISM   TIA (transient ischemic attack) 04/06/2021    PSH:  Past Surgical History:  Procedure Laterality Date   ABDOMINAL HYSTERECTOMY     Ovarian cancer   BIOPSY  02/04/2021   Procedure: BIOPSY;  Surgeon: Ronald Lobo, MD;  Location: [facility];  Service: Endoscopy;;   BREAST CYST ASPIRATION     CESAREAN SECTION     x2   COLONOSCOPY  10-06   Dr. Earnest Bailey   ESOPHAGOGASTRODUODENOSCOPY N/A 02/04/2021   Procedure: ESOPHAGOGASTRODUODENOSCOPY (EGD);  Surgeon: Ronald Lobo, MD;  Location: [facility];  Service: Endoscopy;  Laterality: N/A;   TONSILLECTOMY      Social History:  Social History   Socioeconomic History   Marital status: Married    Spouse name: Not on file   Number of children: 2   Years of education: Not on file   Highest education level: Not on file  Occupational History   Not on file  Tobacco Use   Smoking status: Never   Smokeless tobacco: Never  Vaping Use   Vaping Use: Never used  Substance and Sexual Activity   Alcohol use: Yes    Alcohol/week: 4.0 standard drinks    Types: 4 Glasses of wine per week    Comment: 05/21/21 1 glasses wine 3 x week   Drug use: No   Sexual activity: Not Currently  Other Topics Concern   Not on file  Social History Narrative   Lives with spouse   Social Determinants of Health   Financial Resource Strain: Not on file  Food Insecurity: Not on file  Transportation Needs: Not on file  Physical Activity: Not on file  Stress: Not on file   Social Connections: Not on file  Intimate Partner Violence: Not on file    Family History:  Family History  Problem Relation Age of Onset   Mental illness Mother    Hypertension Mother    Heart failure Mother    CAD Father        CABG at age 13   Mental illness Sister    Alcoholism Sister    Mental illness Brother    Alcoholism Brother    Mental illness Maternal Uncle    Mental illness Maternal Grandmother    Cancer Paternal Grandmother     Medications:   Current Outpatient Medications on File Prior to Visit  Medication Sig Dispense Refill   aspirin  EC 81 MG tablet Take 81 mg by mouth every morning. Swallow whole.     atorvastatin (LIPITOR) 80 MG tablet Take 1 tablet (80 mg total) by mouth daily. 90 tablet 0   buPROPion (WELLBUTRIN XL) 300 MG 24 hr tablet Take one tablet by mouth every morning, 30 tablet 3   guanFACINE (TENEX) 2 MG tablet Take 1 tablet by mouth every morning and 1 tablet  by mouth at bedtime 180 tablet 1   hydrochlorothiazide (HYDRODIURIL) 25 MG tablet TAKE 1 TABLET BY MOUTH ONCE DAILY 90 tablet 2   levothyroxine (SYNTHROID) 88 MCG tablet TAKE 1 TABLET BY MOUTH ONCE DAILY 90 tablet 3   LORazepam (ATIVAN) 1 MG tablet Take 1 tablet by mouth daily as needed for anxiety (30 day supply) 15 tablet 1   methylphenidate (RITALIN) 20 MG tablet TAKE 1 TABLET BY MOUTH 3 TIMES DAILY (FILL 01/04/21) 270 tablet 0   mirabegron ER (MYRBETRIQ) 50 MG TB24 tablet Take 1 tablet (50 mg total) by mouth daily. 30 tablet 1   pilocarpine (SALAGEN) 5 MG tablet TAKE 1 TABLET BY MOUTH 4 TIMES DAILY 360 tablet 1   polyethylene glycol (MIRALAX / GLYCOLAX) 17 g packet Take 8.5 g by mouth daily. Mix in liquid and drink     clopidogrel (PLAVIX) 75 MG tablet Take 1 tablet (75 mg total) by mouth daily. (Patient not taking: No sig reported) 21 tablet 0   COVID-19 mRNA Vac-TriS, Pfizer, (PFIZER-BIONT COVID-19 VAC-TRIS) SUSP injection Inject into the muscle. (Patient not taking: No sig reported) 0.3  mL 0   [DISCONTINUED] estradiol (ESTRACE) 0.5 MG tablet TAKE 1 TABLET BY MOUTH ONCE A DAY (Patient not taking: No sig reported) 30 tablet 2   No current facility-administered medications on file prior to visit.    Allergies:  No Known Allergies    OBJECTIVE:  Physical Exam  Vitals:   05/21/21 0904  BP: (!) 144/78  Pulse: 71  Weight: 130 lb (59 kg)  Height: '5\' 5"'$  (1.651 m)    Body mass index is 21.63 kg/m. No results found.  General: well developed, well nourished, pleasant middle-age Caucasian female, seated, in no evident distress Head: head normocephalic and atraumatic.   Neck: supple with no carotid or supraclavicular bruits Cardiovascular: regular rate and rhythm, no murmurs Musculoskeletal: no deformity Skin:  no rash/petichiae Vascular:  Normal pulses all extremities   Neurologic Exam Mental Status: Awake and fully alert.   Fluent speech and language.  Oriented to place and time. Recent and remote memory intact. Attention span, concentration and fund of knowledge appropriate. Mood and affect appropriate.  Cranial Nerves: Pupils equal, briskly reactive to light. Extraocular movements full without nystagmus. Visual fields full to confrontation. Hearing intact. Facial sensation intact. Face, tongue, palate moves normally and symmetrically.  Motor: Normal bulk and tone. Normal strength in all tested extremity muscles. Sensory.: intact to touch , pinprick , position and vibratory sensation.  Coordination: Rapid alternating movements normal in all extremities. Finger-to-nose and heel-to-shin performed accurately bilaterally. Gait and Station: Arises from chair without difficulty. Stance is normal. Gait demonstrates normal stride length and balance without use of assistive device Reflexes: 1+ and symmetric. Toes downgoing.        ASSESSMENT: Dorothy Wood is a 60 y.o. year old female with recent episode of right-sided numbness/tingling on 04/06/2021 possibly TIA vs  anxiety related.  Prior history of right brain TIA vs anxiety 04/29/2020 after presenting with left-sided numbness and dizziness. Vascular risk factors include HTN, HLD and  EtOH use.      PLAN:  TIAs:  No reoccurring or new stroke/TIA symptoms.  Continue aspirin 81 mg daily  and atorvastatin 80 mg daily for secondary stroke prevention.   Discussed secondary stroke prevention measures and importance of close PCP follow up for aggressive stroke risk factor management  HTN: BP goal <130/90.  Well-controlled at home on hydrochlorothiazide per PCP HLD: LDL goal <70.  LDL 87 (03/2021) continue atorvastatin 80 mg daily. Request monitoring and management per PCP Discussed importance of managing stress levels and ensuring she is adequately taking care of herself    Follow-up in 6 months or call earlier if needed   CC:  [facility] provider: Dr. Jomarie Longs, Elyse Jarvis, MD    I spent 43 minutes of face-to-face and non-face-to-face time with patient.  This included previsit chart review including review of recent hospitalization, lab review, study review, electronic health record documentation, patient education regarding recent TIA vs anxiety induced, secondary stroke prevention measures and importance of aggressive stroke risk factor management and answered all other questions to patient satisfaction  Frann Rider, AGNP-BC  [facility] [address] [place], [place] [address]  Phone [phone] Fax [phone] Note: This document was prepared with digital dictation and possible smart phrase technology. Any transcriptional errors that result from this process are unintentional.

## 2021-05-22 DIAGNOSIS — N3941 Urge incontinence: Secondary | ICD-10-CM | POA: Diagnosis not present

## 2021-05-22 DIAGNOSIS — R351 Nocturia: Secondary | ICD-10-CM | POA: Diagnosis not present

## 2021-05-27 NOTE — Progress Notes (Signed)
I agree with the above plan 

## 2021-06-01 ENCOUNTER — Encounter: Payer: Self-pay | Admitting: Family Medicine

## 2021-06-02 ENCOUNTER — Other Ambulatory Visit: Payer: Self-pay

## 2021-06-02 DIAGNOSIS — M7582 Other shoulder lesions, left shoulder: Secondary | ICD-10-CM

## 2021-06-04 ENCOUNTER — Other Ambulatory Visit (HOSPITAL_COMMUNITY): Payer: Self-pay

## 2021-06-11 ENCOUNTER — Other Ambulatory Visit: Payer: Self-pay

## 2021-06-11 ENCOUNTER — Other Ambulatory Visit (HOSPITAL_COMMUNITY): Payer: Self-pay

## 2021-06-11 ENCOUNTER — Ambulatory Visit: Payer: 59 | Attending: Family Medicine

## 2021-06-11 DIAGNOSIS — M25512 Pain in left shoulder: Secondary | ICD-10-CM | POA: Diagnosis not present

## 2021-06-11 DIAGNOSIS — M25612 Stiffness of left shoulder, not elsewhere classified: Secondary | ICD-10-CM | POA: Diagnosis not present

## 2021-06-11 NOTE — Therapy (Addendum)
[place], [place], [address] Phone: [phone]   Fax:  [phone]  Physical Therapy Evaluation  Patient Details  Name: Dorothy Wood MRN: [id number] Date of Birth: Dec 27, 1960 Referring Provider (PT): Denita Lung, MD   Encounter Date: 06/11/2021   PT End of Session - 06/11/21 1203     Visit Number 1    Number of Visits 13    Date for PT Re-Evaluation 07/23/21    Authorization Type [place] [employer]    PT Start Time 1130    PT Stop Time 1200    PT Time Calculation (min) 30 min    Activity Tolerance Patient tolerated treatment well;No increased pain    Behavior During Therapy WFL for tasks assessed/performed             Past Medical History:  Diagnosis Date   Cancer ([place])    OVARIAN   Colonic polyp    Gastritis    Hyperlipidemia    Hypertension    Sjogren's disease ([place])    Stroke ([place])    Thyroid disease    HYPOTHYROIDISM   TIA (transient ischemic attack) 04/06/2021    Past Surgical History:  Procedure Laterality Date   ABDOMINAL HYSTERECTOMY     Ovarian cancer   BIOPSY  02/04/2021   Procedure: BIOPSY;  Surgeon: Ronald Lobo, MD;  Location: [facility];  Service: Endoscopy;;   BREAST CYST ASPIRATION     CESAREAN SECTION     x2   COLONOSCOPY  10-06   Dr. Earnest Bailey   ESOPHAGOGASTRODUODENOSCOPY N/A 02/04/2021   Procedure: ESOPHAGOGASTRODUODENOSCOPY (EGD);  Surgeon: Ronald Lobo, MD;  Location: [facility];  Service: Endoscopy;  Laterality: N/A;   TONSILLECTOMY      There were no vitals filed for this visit.    Subjective Assessment - 06/11/21 1132     Subjective Pt presents to PT with reports of roughly one month hx of L shoulder pain and discomfort with elevation and rotational movements. Pt notes that initial injury/pain occured when feeling a "pop" when performing doorway pec stretch. She is R hand dominant and has noted decline in ADL performance d/t L shoulder pain.  Denies n/t or referral of pain down L UE.    Limitations Lifting;Other (comment)   reaching overhead   How long can you sit comfortably? indefinite    How long can you stand comfortably? indefinite    How long can you walk comfortably? indefinite    Patient Stated Goals decrease L shoulder pain in order to improve comfort and ADL performance    Currently in Pain? No/denies    Pain Score 0-No pain   6/10 at worst in last 2 weeks   Pain Location Shoulder    Pain Orientation Left    Pain Type Acute pain    Pain Onset 1 to 4 weeks ago    Pain Frequency Intermittent    Aggravating Factors  reaching behind back, reaching overhead    Pain Relieving Factors heat           [facility] Adult PT Treatment/Exercise:   Therapeutic Exercise:  Scapular retraction x 10 - 5 sec hold Rows x 10 - blue tband L shoulder IR/ER isometric x 10 - 5 sec     [facility] PT Assessment - 06/11/21 0001       Assessment   Medical Diagnosis M75.82 (ICD-10-CM) - Tendinitis of left rotator cuff    Referring Provider (PT) Denita Lung, MD  Hand Dominance Right    Prior Therapy Yes -      Precautions   Precautions None      Restrictions   Weight Bearing Restrictions No      Balance Screen   Has the patient fallen in the past 6 months No    Has the patient had a decrease in activity level because of a fear of falling?  No    Is the patient reluctant to leave their home because of a fear of falling?  No      Prior Function   Level of [place] Independent;Independent with basic ADLs    Vocation Full time employment    Chief Technology Officer for [employer]   Overall Cognitive Status Within Functional Limits for tasks assessed    Attention Focused      Observation/Other Assessments   Focus on Therapeutic Outcomes (FOTO)  54% function; 70% predicted      ROM / Strength   AROM / PROM / Strength AROM      AROM   Overall AROM Comments R UE WNL    Left Shoulder Flexion 115 Degrees    Left  Shoulder ABduction 85 Degrees    Left Shoulder Internal Rotation --   L PSIS     Palpation   Palpation comment no TTP to L biceps tendon, L UT, or L supraspinatus      Special Tests    Special Tests Rotator Cuff Impingement    Rotator Cuff Impingment tests Michel Bickers test      Hawkins-Kennedy test   Findings Positive    Side Left                        Objective measurements completed on examination: See above findings.                PT Education - 06/11/21 1202     Education Details eval findings, HEP, POC, FOTO review    Person(s) Educated Patient    Methods Explanation;Demonstration;Handout    Comprehension Verbalized understanding;Returned demonstration              PT Short Term Goals - 06/11/21 1203       PT SHORT TERM GOAL #1   Title Pt will be compliant and knowledgeable with initial HEP for improved carryover and comfort    Baseline initial HEP given    Time 3    Period Weeks    Status New    Target Date 07/02/21               PT Long Term Goals - 06/11/21 1204       PT LONG TERM GOAL #1   Title Pt will improve FOTO score to no less than 70% function as proxy for functional improvement    Baseline 54% function    Time 6    Period Weeks    Status New    Target Date 07/23/21      PT LONG TERM GOAL #2   Title Pt will improve L shoulder flex/abd to no less than 150 deg for improved ADL performance    Baseline see flowsheet    Time 6    Period Weeks    Status New    Target Date 07/23/21      PT LONG TERM GOAL #3   Title Pt will self report L shoulder pain no greater than 3/10 at worst for  improved comfort and function    Baseline 6/10 at worst    Time 6    Period Weeks    Status New    Target Date 07/23/21                    Plan - 06/11/21 1207     Clinical Impression Statement Pt is a 60 y/o F who presents to PT with reports of acute L shoulder pain. Physical findings are consistent with  MD impression, as she demonstrates decreased L shoulder ROM, pain, and positive inpingement tests. Pt would benefit from skilled PT services working on improving shoulder movement mechanics, RTC and periscapular muscle strength in order to decrease pain and improve function. Will assess response to initial HEP and progress as able.    Personal Factors and Comorbidities Comorbidity 3+;Past/Current Experience    Comorbidities PMH: Cancer, Sjogren's disease, CVA, HTN    Examination-Activity Limitations Carry;Reach Overhead;Lift    Examination-Participation Restrictions Occupation;Community Activity;Yard Work    Merchant navy officer Evolving/Moderate complexity    Clinical Decision Making Moderate    Rehab Potential Good    PT Frequency 2x / week    PT Duration 6 weeks    PT Treatment/Interventions ADLs/Self Care Home Management;Electrical Stimulation;Cryotherapy;Moist Heat;Functional mobility training;Therapeutic activities;Therapeutic exercise;Neuromuscular re-education;Patient/family education;Manual techniques;Passive range of motion;Dry needling;Taping;Vasopneumatic Device    PT Next Visit Plan assess response to HEP, DN for L shoulder?, progress as able    PT Home Exercise Plan Access Code: [email or web address]    Consulted and Agree with Plan of Care Patient             Patient will benefit from skilled therapeutic intervention in order to improve the following deficits and impairments:  Decreased activity tolerance, Decreased endurance, Decreased range of motion, Pain  Visit Diagnosis: Acute pain of left shoulder - Plan: PT plan of care cert/re-cert  Stiffness of left shoulder, not elsewhere classified - Plan: PT plan of care cert/re-cert     Problem List Patient Active Problem List   Diagnosis Date Noted   Right arm weakness    Paresthesia    Gastritis 02/04/2021   Intractable nausea and vomiting 02/02/2021   Prolonged QT interval 02/02/2021   High anion gap metabolic  acidosis 76/54/6503   OSA (obstructive sleep apnea) 09/10/2020   TIA (transient ischemic attack) 04/29/2020   Essential hypertension 04/29/2020   Hormone replacement therapy (HRT) 09/09/2016   Hypothyroidism 07/16/2014   Sjogren's syndrome ([place]) 04/11/2013   History of ovarian cancer 04/11/2013   Personal history of colonic polyps 04/11/2013   ADD (attention deficit disorder) 07/06/2012   Dysthymia 07/06/2012    [name], PT 06/11/2021, 12:17 PM  [facility] [address] [place], [place], [address] Phone: [phone]   Fax:  [phone]  Name: Dorothy Wood MRN: [id number] Date of Birth: [birth year]

## 2021-06-14 ENCOUNTER — Other Ambulatory Visit (HOSPITAL_COMMUNITY): Payer: Self-pay

## 2021-06-16 ENCOUNTER — Other Ambulatory Visit (HOSPITAL_COMMUNITY): Payer: Self-pay

## 2021-06-17 ENCOUNTER — Other Ambulatory Visit (HOSPITAL_COMMUNITY): Payer: Self-pay

## 2021-06-18 ENCOUNTER — Ambulatory Visit: Payer: 59

## 2021-06-23 ENCOUNTER — Other Ambulatory Visit (HOSPITAL_COMMUNITY): Payer: Self-pay

## 2021-06-24 ENCOUNTER — Other Ambulatory Visit (HOSPITAL_COMMUNITY): Payer: Self-pay

## 2021-06-27 ENCOUNTER — Other Ambulatory Visit (HOSPITAL_COMMUNITY): Payer: Self-pay

## 2021-06-30 ENCOUNTER — Other Ambulatory Visit (HOSPITAL_COMMUNITY): Payer: Self-pay

## 2021-06-30 ENCOUNTER — Encounter: Payer: 59 | Admitting: Family Medicine

## 2021-07-01 ENCOUNTER — Other Ambulatory Visit: Payer: Self-pay

## 2021-07-01 ENCOUNTER — Ambulatory Visit: Payer: 59 | Attending: Family Medicine

## 2021-07-01 ENCOUNTER — Other Ambulatory Visit (HOSPITAL_COMMUNITY): Payer: Self-pay

## 2021-07-01 DIAGNOSIS — M25612 Stiffness of left shoulder, not elsewhere classified: Secondary | ICD-10-CM | POA: Diagnosis not present

## 2021-07-01 DIAGNOSIS — M25512 Pain in left shoulder: Secondary | ICD-10-CM | POA: Insufficient documentation

## 2021-07-01 NOTE — Therapy (Signed)
[place], [place], [address] Phone: [phone]   Fax:  [phone]  Physical Therapy Treatment  Patient Details  Name: Dorothy Wood MRN: [id number] Date of Birth: 05-Aug-1961 Referring Provider (PT): Denita Lung, MD   Encounter Date: 07/01/2021   PT End of Session - 07/01/21 1358     Visit Number 2    Number of Visits 13    Date for PT Re-Evaluation 07/23/21    Authorization Type [facility] [employer]    PT Start Time 1400    PT Stop Time 8416    PT Time Calculation (min) 39 min    Activity Tolerance Patient tolerated treatment well;No increased pain    Behavior During Therapy WFL for tasks assessed/performed             Past Medical History:  Diagnosis Date   Cancer ([place])    OVARIAN   Colonic polyp    Gastritis    Hyperlipidemia    Hypertension    Sjogren's disease ([place])    Stroke ([place])    Thyroid disease    HYPOTHYROIDISM   TIA (transient ischemic attack) 04/06/2021    Past Surgical History:  Procedure Laterality Date   ABDOMINAL HYSTERECTOMY     Ovarian cancer   BIOPSY  02/04/2021   Procedure: BIOPSY;  Surgeon: Ronald Lobo, MD;  Location: [facility];  Service: Endoscopy;;   BREAST CYST ASPIRATION     CESAREAN SECTION     x2   COLONOSCOPY  10-06   Dr. Earnest Bailey   ESOPHAGOGASTRODUODENOSCOPY N/A 02/04/2021   Procedure: ESOPHAGOGASTRODUODENOSCOPY (EGD);  Surgeon: Ronald Lobo, MD;  Location: [facility];  Service: Endoscopy;  Laterality: N/A;   TONSILLECTOMY      There were no vitals filed for this visit.   Subjective Assessment - 07/01/21 1358     Subjective Pt presents to PT with reports of continued L shoulder pain with specific movement. She has been fairly compliant with her HEP with no adverse effect. Ready to begin PT at this time.    Currently in Pain? No/denies    Pain Score 0-No pain   5/10 with fwd flexion of L shoulder   Pain Location Shoulder    Pain  Orientation Left           [facility] Adult PT Treatment/Exercise:   Therapeutic Exercise:  UBE lvl 1.0 x 4 min (76fwd/2bwd) while taking subjective Rows 3x12 13lbs Shoulder ext 2x10 17lbs L shoulder IR/ER isometric x 10 - 5 sec Shoulder IR/ER 2x10 red tband Total gym row 2x10 25lbs Seated ER w/ scap retrac 2x10 red tband Horizontal abd 3x10 red tband S/L ER 2x10 2lb  Manual Therapy:  Grade II-III PA mobs to L shoulder                            PT Education - 07/01/21 1419     Education Details HEP update    Person(s) Educated Patient    Methods Explanation;Demonstration;Handout    Comprehension Returned demonstration;Verbalized understanding              PT Short Term Goals - 06/11/21 1203       PT SHORT TERM GOAL #1   Title Pt will be compliant and knowledgeable with initial HEP for improved carryover and comfort    Baseline initial HEP given    Time 3    Period Weeks    Status New  Target Date 07/02/21               PT Long Term Goals - 06/11/21 1204       PT LONG TERM GOAL #1   Title Pt will improve FOTO score to no less than 70% function as proxy for functional improvement    Baseline 54% function    Time 6    Period Weeks    Status New    Target Date 07/23/21      PT LONG TERM GOAL #2   Title Pt will improve L shoulder flex/abd to no less than 150 deg for improved ADL performance    Baseline see flowsheet    Time 6    Period Weeks    Status New    Target Date 07/23/21      PT LONG TERM GOAL #3   Title Pt will self report L shoulder pain no greater than 3/10 at worst for improved comfort and function    Baseline 6/10 at worst    Time 6    Period Weeks    Status New    Target Date 07/23/21                   Plan - 07/01/21 1431     Clinical Impression Statement Pt was able to complete all prescribed exercises with no adverse effect. Today's session focused on improving periscapular and RTC strength  as well as mobilization to decrease pain. She is responding well to therapy thus far and will continue to be seen and progressed as tolerated.    PT Treatment/Interventions ADLs/Self Care Home Management;Electrical Stimulation;Cryotherapy;Moist Heat;Functional mobility training;Therapeutic activities;Therapeutic exercise;Neuromuscular re-education;Patient/family education;Manual techniques;Passive range of motion;Dry needling;Taping;Vasopneumatic Device    PT Next Visit Plan assess response to HEP, DN for L shoulder?, progress as able    PT Home Exercise Plan Access Code: [facility]             Patient will benefit from skilled therapeutic intervention in order to improve the following deficits and impairments:  Decreased activity tolerance, Decreased endurance, Decreased range of motion, Pain  Visit Diagnosis: Acute pain of left shoulder  Stiffness of left shoulder, not elsewhere classified     Problem List Patient Active Problem List   Diagnosis Date Noted   Right arm weakness    Paresthesia    Gastritis 02/04/2021   Intractable nausea and vomiting 02/02/2021   Prolonged QT interval 02/02/2021   High anion gap metabolic acidosis 42/87/6811   OSA (obstructive sleep apnea) 09/10/2020   TIA (transient ischemic attack) 04/29/2020   Essential hypertension 04/29/2020   Hormone replacement therapy (HRT) 09/09/2016   Hypothyroidism 07/16/2014   Sjogren's syndrome ([place]) 04/11/2013   History of ovarian cancer 04/11/2013   Personal history of colonic polyps 04/11/2013   ADD (attention deficit disorder) 07/06/2012   Dysthymia 07/06/2012    [name], PT 07/01/2021, 2:41 PM  [facility] [address] [place], [place], [address] Phone: [phone]   Fax:  [phone]  Name: Dorothy Wood MRN: [id number] Date of Birth: [birth year]

## 2021-07-02 ENCOUNTER — Encounter: Payer: Self-pay | Admitting: Physical Therapy

## 2021-07-02 ENCOUNTER — Ambulatory Visit: Payer: 59 | Attending: Internal Medicine

## 2021-07-02 ENCOUNTER — Other Ambulatory Visit (HOSPITAL_COMMUNITY): Payer: Self-pay

## 2021-07-02 DIAGNOSIS — Z23 Encounter for immunization: Secondary | ICD-10-CM

## 2021-07-02 NOTE — Progress Notes (Signed)
   Covid-19 Vaccination Clinic  Name:  Dorothy Wood    MRN: [id number] DOB: [birth year]  07/02/2021  [name] was observed post Covid-19 immunization for 15 minutes without incident. She was provided with Vaccine Information Sheet and instruction to access the V-Safe system.   [name] was instructed to call 911 with any severe reactions post vaccine: Difficulty breathing  Swelling of face and throat  A fast heartbeat  A bad rash all over body  Dizziness and weakness

## 2021-07-03 ENCOUNTER — Other Ambulatory Visit (HOSPITAL_COMMUNITY): Payer: Self-pay

## 2021-07-03 ENCOUNTER — Encounter: Payer: 59 | Admitting: Physical Therapy

## 2021-07-03 DIAGNOSIS — F419 Anxiety disorder, unspecified: Secondary | ICD-10-CM | POA: Diagnosis not present

## 2021-07-03 DIAGNOSIS — F902 Attention-deficit hyperactivity disorder, combined type: Secondary | ICD-10-CM | POA: Diagnosis not present

## 2021-07-03 MED ORDER — LORAZEPAM 1 MG PO TABS
ORAL_TABLET | ORAL | 2 refills | Status: DC
  Filled 2021-07-03: qty 30, 30d supply, fill #0
  Filled 2021-08-11: qty 30, 30d supply, fill #1
  Filled 2021-09-13: qty 30, 30d supply, fill #2

## 2021-07-03 MED ORDER — BUPROPION HCL ER (XL) 300 MG PO TB24
ORAL_TABLET | ORAL | 1 refills | Status: DC
  Filled 2021-07-03 – 2021-08-20 (×2): qty 90, 90d supply, fill #0
  Filled 2021-11-18: qty 90, 90d supply, fill #1

## 2021-07-03 MED ORDER — GUANFACINE HCL 2 MG PO TABS
ORAL_TABLET | ORAL | 1 refills | Status: DC
  Filled 2021-07-03 – 2021-09-22 (×2): qty 180, 90d supply, fill #0
  Filled 2021-12-31: qty 64, 32d supply, fill #1
  Filled 2022-01-02: qty 116, 58d supply, fill #1

## 2021-07-03 MED ORDER — METHYLPHENIDATE HCL 20 MG PO TABS
ORAL_TABLET | ORAL | 0 refills | Status: DC
  Filled 2021-07-03: qty 270, 90d supply, fill #0

## 2021-07-04 ENCOUNTER — Other Ambulatory Visit (HOSPITAL_COMMUNITY): Payer: Self-pay

## 2021-07-07 ENCOUNTER — Ambulatory Visit: Payer: Self-pay

## 2021-07-07 ENCOUNTER — Encounter: Payer: Self-pay | Admitting: Physical Therapy

## 2021-07-07 ENCOUNTER — Ambulatory Visit: Payer: 59 | Admitting: Physical Therapy

## 2021-07-07 ENCOUNTER — Ambulatory Visit (INDEPENDENT_AMBULATORY_CARE_PROVIDER_SITE_OTHER): Payer: 59 | Admitting: Orthopaedic Surgery

## 2021-07-07 ENCOUNTER — Other Ambulatory Visit: Payer: Self-pay

## 2021-07-07 ENCOUNTER — Other Ambulatory Visit: Payer: Self-pay | Admitting: Internal Medicine

## 2021-07-07 DIAGNOSIS — M25512 Pain in left shoulder: Secondary | ICD-10-CM | POA: Diagnosis not present

## 2021-07-07 DIAGNOSIS — M25561 Pain in right knee: Secondary | ICD-10-CM

## 2021-07-07 DIAGNOSIS — M25612 Stiffness of left shoulder, not elsewhere classified: Secondary | ICD-10-CM | POA: Diagnosis not present

## 2021-07-07 NOTE — Progress Notes (Signed)
Office Visit Note   Patient: Dorothy Wood           Date of Birth: 04-Sep-1961           MRN: [id number] Visit Date: 07/07/2021              Requested by: Denita Lung, MD [place],  [place] [address] PCP: Denita Lung, MD   Assessment & Plan: Visit Diagnoses:  1. Acute pain of right knee     Plan: I went over the patient's clinical exam and x-rays with her.  I have recommended Voltaren gel to try the medial aspect of the knee and just give it a little bit more time.  My next step would be considering an intra-articular steroid injection in the right knee and I did offer her that today but she declined and I agree with this as well since her knee is feeling somewhat better.  If she does have issues that recur or is not getting better she knows to let us know.  All questions and concerns were answered and addressed.  Follow-Up Instructions: Return if symptoms worsen or fail to improve.   Orders:  Orders Placed This Encounter  Procedures   XR Knee 1-2 Views Right   No orders of the defined types were placed in this encounter.     Procedures: No procedures performed   Clinical Data: No additional findings.   Subjective: Chief Complaint  Patient presents with   Right Knee - Pain  The patient is a thin and active 60 year old female who comes in with just a few weeks worth of right knee pain with no known injury.  She did hike recently out [place].  She points to medial joint line as source of her pain.  It has been hurting some with weightbearing and pivoting activities but she denies any swelling or locking catching.  She has not injured this knee before and has not had any previous right knee surgery.  HPI  Review of Systems There is currently listed no headache, chest pain, shortness of breath, fever, chills, nausea, vomiting  Objective: Vital Signs: There were no vitals taken for this visit.  Physical Exam She is alert and orient x3 and in  no acute distress Ortho Exam Examination of her right knee shows no effusion.  There is medial joint line tenderness.  There is slight pain when I externally rotate and internally rotate the tibial move femur from flexion to extension on the right knee medial compartment.  The extensor mechanism is intact.  The knee feels ligamentously stable. Specialty Comments:  No specialty comments available.  Imaging: XR Knee 1-2 Views Right  Result Date: 07/07/2021 2 views of the right knee show no acute findings.  The alignment is anatomic and the joint space is well-maintained.  There is no effusion.    PMFS History: Patient Active Problem List   Diagnosis Date Noted   Right arm weakness    Paresthesia    Gastritis 02/04/2021   Intractable nausea and vomiting 02/02/2021   Prolonged QT interval 02/02/2021   High anion gap metabolic acidosis 70/62/3762   OSA (obstructive sleep apnea) 09/10/2020   TIA (transient ischemic attack) 04/29/2020   Essential hypertension 04/29/2020   Hormone replacement therapy (HRT) 09/09/2016   Hypothyroidism 07/16/2014   Sjogren's syndrome ([place]) 04/11/2013   History of ovarian cancer 04/11/2013   Personal history of colonic polyps 04/11/2013   ADD (attention deficit disorder) 07/06/2012   Dysthymia  07/06/2012   Past Medical History:  Diagnosis Date   Cancer ([place])    OVARIAN   Colonic polyp    Gastritis    Hyperlipidemia    Hypertension    Sjogren's disease ([place])    Stroke ([place])    Thyroid disease    HYPOTHYROIDISM   TIA (transient ischemic attack) 04/06/2021    Family History  Problem Relation Age of Onset   Mental illness Mother    Hypertension Mother    Heart failure Mother    CAD Father        CABG at age 57   Mental illness Sister    Alcoholism Sister    Mental illness Brother    Alcoholism Brother    Mental illness Maternal Uncle    Mental illness Maternal Grandmother    Cancer Paternal Grandmother     Past Surgical History:   Procedure Laterality Date   ABDOMINAL HYSTERECTOMY     Ovarian cancer   BIOPSY  02/04/2021   Procedure: BIOPSY;  Surgeon: Ronald Lobo, MD;  [facility];  Service: Endoscopy;;   BREAST CYST ASPIRATION     CESAREAN SECTION     x2   COLONOSCOPY  10-06   Dr. Earnest Bailey   ESOPHAGOGASTRODUODENOSCOPY N/A 02/04/2021   Procedure: ESOPHAGOGASTRODUODENOSCOPY (EGD);  Surgeon: Ronald Lobo, MD;  Location: [facility];  Service: Endoscopy;  Laterality: N/A;   TONSILLECTOMY     Social History   Occupational History   Not on file  Tobacco Use   Smoking status: Never   Smokeless tobacco: Never  Vaping Use   Vaping Use: Never used  Substance and Sexual Activity   Alcohol use: Yes    Alcohol/week: 4.0 standard drinks    Types: 4 Glasses of wine per week    Comment: 05/21/21 1 glasses wine 3 x week   Drug use: No   Sexual activity: Not Currently

## 2021-07-07 NOTE — Therapy (Signed)
[place], [place], [address] Phone: [phone]   Fax:  [phone]  Physical Therapy Treatment  Patient Details  Name: Dorothy Wood MRN: [id number] Date of Birth: 22-Mar-1961 Referring Provider (PT): Denita Lung, MD   Encounter Date: 07/07/2021   PT End of Session - 07/07/21 1116     Visit Number 3    Number of Visits 13    Date for PT Re-Evaluation 07/23/21    Authorization Type [facility] [employer]    PT Start Time 1103    PT Stop Time 1155    PT Time Calculation (min) 52 min             Past Medical History:  Diagnosis Date   Cancer ([place])    OVARIAN   Colonic polyp    Gastritis    Hyperlipidemia    Hypertension    Sjogren's disease ([place])    Stroke ([place])    Thyroid disease    HYPOTHYROIDISM   TIA (transient ischemic attack) 04/06/2021    Past Surgical History:  Procedure Laterality Date   ABDOMINAL HYSTERECTOMY     Ovarian cancer   BIOPSY  02/04/2021   Procedure: BIOPSY;  Surgeon: Ronald Lobo, MD;  [facility];  Service: Endoscopy;;   BREAST CYST ASPIRATION     CESAREAN SECTION     x2   COLONOSCOPY  10-06   Dr. Earnest Bailey   ESOPHAGOGASTRODUODENOSCOPY N/A 02/04/2021   Procedure: ESOPHAGOGASTRODUODENOSCOPY (EGD);  Surgeon: Ronald Lobo, MD;  Location: [facility];  Service: Endoscopy;  Laterality: N/A;   TONSILLECTOMY      There were no vitals filed for this visit.   Subjective Assessment - 07/07/21 1109     Subjective No pain at rest. Increased pain with reaching out to the side.    Currently in Pain? No/denies               [facility] Adult PT Treatment/Exercise:   Therapeutic Exercise:  UBE lvl 1.0 x 4 min (7fwd/2bwd) while taking subjective Rows 3x12 Blue Shoulder ext 2x10 17lbs (not today)  L shoulder IR/ER isometric x 10 - 5 sec (not today)  Shoulder IR/ER 2x10 red tband  Total gym row 2x10 25lbs Seated ER w/ scap retrac 2x10 red  tband Horizontal abd 3x10 red tband (supine)  S/L ER 2x10 2lb  Manual Therapy:  Grade II-III PA mobs to L shoulder followed by PROM  Modalities:   Cryotherapy left shoulder x 10 minutes                            PT Short Term Goals - 06/11/21 1203       PT SHORT TERM GOAL #1   Title Pt will be compliant and knowledgeable with initial HEP for improved carryover and comfort    Baseline initial HEP given    Time 3    Period Weeks    Status New    Target Date 07/02/21               PT Long Term Goals - 06/11/21 1204       PT LONG TERM GOAL #1   Title Pt will improve FOTO score to no less than 70% function as proxy for functional improvement    Baseline 54% function    Time 6    Period Weeks    Status New    Target Date 07/23/21  PT LONG TERM GOAL #2   Title Pt will improve L shoulder flex/abd to no less than 150 deg for improved ADL performance    Baseline see flowsheet    Time 6    Period Weeks    Status New    Target Date 07/23/21      PT LONG TERM GOAL #3   Title Pt will self report L shoulder pain no greater than 3/10 at worst for improved comfort and function    Baseline 6/10 at worst    Time 6    Period Weeks    Status New    Target Date 07/23/21                   Plan - 07/07/21 1110     Clinical Impression Statement Pt reports compliance with HEP. Clarified color of bands to use with which exercsises. Continued with scap and RTC strengthening as tolerated. Manual therapy performed to improve ROM as well as AAROM exercises. Trial of ice pack to shoulder at end of session.    PT Treatment/Interventions ADLs/Self Care Home Management;Electrical Stimulation;Cryotherapy;Moist Heat;Functional mobility training;Therapeutic activities;Therapeutic exercise;Neuromuscular re-education;Patient/family education;Manual techniques;Passive range of motion;Dry needling;Taping;Vasopneumatic Device    PT Next Visit Plan assess  response to HEP, DN for L shoulder?, progress as able    PT Home Exercise Plan Access Code: [facility]             Patient will benefit from skilled therapeutic intervention in order to improve the following deficits and impairments:  Decreased activity tolerance, Decreased endurance, Decreased range of motion, Pain  Visit Diagnosis: Acute pain of left shoulder  Stiffness of left shoulder, not elsewhere classified     Problem List Patient Active Problem List   Diagnosis Date Noted   Right arm weakness    Paresthesia    Gastritis 02/04/2021   Intractable nausea and vomiting 02/02/2021   Prolonged QT interval 02/02/2021   High anion gap metabolic acidosis 42/68/3419   OSA (obstructive sleep apnea) 09/10/2020   TIA (transient ischemic attack) 04/29/2020   Essential hypertension 04/29/2020   Hormone replacement therapy (HRT) 09/09/2016   Hypothyroidism 07/16/2014   Sjogren's syndrome ([place]) 04/11/2013   History of ovarian cancer 04/11/2013   Personal history of colonic polyps 04/11/2013   ADD (attention deficit disorder) 07/06/2012   Dysthymia 07/06/2012    Dorothy Wood, PTA 07/07/2021, 12:44 PM  [facility] [address] [place], [place], [address] Phone: [phone]   Fax:  [phone]  Name: Dorothy NICKOLSON MRN: [id number] Date of Birth: April 29, 1961

## 2021-07-07 NOTE — Progress Notes (Deleted)
++++++++++++++++++++++++++++++++++++++++++                                                                                                                                                                                                                                                                                                                                                                                                                                                                                                             Office Visit Note   Patient: Dorothy Wood           Date of Birth: 1961-05-03           MRN: [id number] Visit Date: 07/07/2021              Requested by: Denita Lung, MD [address] [place],  [place] [address] PCP: Denita Lung, MD   Assessment & Plan: Visit Diagnoses:  1. Acute pain of right knee     Plan: ***  Follow-Up Instructions: No follow-ups on file.   Orders:  Orders Placed This Encounter  Procedures  XR Knee 1-2 Views Right   No orders of the defined types were placed in this encounter.     Procedures: No procedures performed   Clinical Data: No additional findings.   Subjective: Chief Complaint  Patient presents with   Right Knee - Pain    HPI  Review of Systems   Objective: Vital Signs: There were no vitals taken for this visit.  Physical Exam  Ortho Exam  Specialty Comments:  No specialty comments available.  Imaging: No  results found.   PMFS History: Patient Active Problem List   Diagnosis Date Noted   Right arm weakness    Paresthesia    Gastritis 02/04/2021   Intractable nausea and vomiting 02/02/2021   Prolonged QT interval 02/02/2021   High anion gap metabolic acidosis 28/36/6294   OSA (obstructive sleep apnea) 09/10/2020   TIA (transient ischemic attack) 04/29/2020   Essential hypertension 04/29/2020   Hormone replacement therapy (HRT) 09/09/2016   Hypothyroidism 07/16/2014   Sjogren's syndrome ([place]) 04/11/2013   History of ovarian cancer 04/11/2013   Personal history of colonic polyps 04/11/2013   ADD (attention deficit disorder) 07/06/2012   Dysthymia 07/06/2012   Past Medical History:  Diagnosis Date   Cancer ([place])    OVARIAN   Colonic polyp    Gastritis    Hyperlipidemia    Hypertension    Sjogren's disease ([place])    Stroke ([place])    Thyroid disease    HYPOTHYROIDISM   TIA (transient ischemic attack) 04/06/2021    Family History  Problem Relation Age of Onset   Mental illness Mother    Hypertension Mother    Heart failure Mother    CAD Father        CABG at age 55   Mental illness Sister    Alcoholism Sister    Mental illness Brother    Alcoholism Brother    Mental illness Maternal Uncle    Mental illness Maternal Grandmother    Cancer Paternal Grandmother     Past Surgical History:  Procedure Laterality Date   ABDOMINAL HYSTERECTOMY     Ovarian cancer   BIOPSY  02/04/2021   Procedure: BIOPSY;  Surgeon: Ronald Lobo, MD;  [facility];  Service: Endoscopy;;   BREAST CYST ASPIRATION     CESAREAN SECTION     x2   COLONOSCOPY  10-06   Dr. Earnest Bailey   ESOPHAGOGASTRODUODENOSCOPY N/A 02/04/2021   Procedure: ESOPHAGOGASTRODUODENOSCOPY (EGD);  Surgeon: Ronald Lobo, MD;  Location: [facility];  Service: Endoscopy;  Laterality: N/A;   TONSILLECTOMY     Social History   Occupational History   Not on file  Tobacco Use   Smoking status: Never    Smokeless tobacco: Never  Vaping Use   Vaping Use: Never used  Substance and Sexual Activity   Alcohol use: Yes    Alcohol/week: 4.0 standard drinks    Types: 4 Glasses of wine per week    Comment: 05/21/21 1 glasses wine 3 x week   Drug use: No   Sexual activity: Not Currently

## 2021-07-08 ENCOUNTER — Other Ambulatory Visit (HOSPITAL_COMMUNITY): Payer: Self-pay

## 2021-07-08 ENCOUNTER — Other Ambulatory Visit: Payer: Self-pay | Admitting: Family Medicine

## 2021-07-08 ENCOUNTER — Other Ambulatory Visit: Payer: Self-pay

## 2021-07-09 ENCOUNTER — Other Ambulatory Visit (HOSPITAL_COMMUNITY): Payer: Self-pay

## 2021-07-09 ENCOUNTER — Ambulatory Visit: Payer: 59 | Admitting: Physical Therapy

## 2021-07-09 MED ORDER — ATORVASTATIN CALCIUM 80 MG PO TABS
80.0000 mg | ORAL_TABLET | Freq: Every day | ORAL | 0 refills | Status: DC
  Filled 2021-07-09: qty 90, 90d supply, fill #0

## 2021-07-09 MED ORDER — MYRBETRIQ 50 MG PO TB24
50.0000 mg | ORAL_TABLET | Freq: Every day | ORAL | 1 refills | Status: DC
  Filled 2021-07-09: qty 30, 30d supply, fill #0
  Filled 2021-08-11: qty 30, 30d supply, fill #1

## 2021-07-10 ENCOUNTER — Other Ambulatory Visit (HOSPITAL_COMMUNITY): Payer: Self-pay

## 2021-07-19 ENCOUNTER — Other Ambulatory Visit (HOSPITAL_COMMUNITY): Payer: Self-pay

## 2021-07-21 ENCOUNTER — Ambulatory Visit: Payer: 59 | Attending: Family Medicine | Admitting: Physical Therapy

## 2021-07-21 ENCOUNTER — Other Ambulatory Visit: Payer: Self-pay

## 2021-07-21 ENCOUNTER — Encounter: Payer: Self-pay | Admitting: Physical Therapy

## 2021-07-21 DIAGNOSIS — R29898 Other symptoms and signs involving the musculoskeletal system: Secondary | ICD-10-CM | POA: Insufficient documentation

## 2021-07-21 DIAGNOSIS — M25512 Pain in left shoulder: Secondary | ICD-10-CM | POA: Diagnosis not present

## 2021-07-21 DIAGNOSIS — M79604 Pain in right leg: Secondary | ICD-10-CM | POA: Diagnosis not present

## 2021-07-21 DIAGNOSIS — M6281 Muscle weakness (generalized): Secondary | ICD-10-CM | POA: Insufficient documentation

## 2021-07-21 DIAGNOSIS — M25612 Stiffness of left shoulder, not elsewhere classified: Secondary | ICD-10-CM | POA: Insufficient documentation

## 2021-07-21 DIAGNOSIS — R1310 Dysphagia, unspecified: Secondary | ICD-10-CM | POA: Insufficient documentation

## 2021-07-21 NOTE — Therapy (Signed)
[place], [place], [address] Phone: [phone]   Fax:  [phone]  Physical Therapy Treatment  Patient Details  Name: Dorothy Wood MRN: [id number] Date of Birth: 10-14-1960 Referring Provider (PT): Denita Lung, MD   Encounter Date: 07/21/2021   PT End of Session - 07/21/21 0848     Visit Number 4    Number of Visits 13    Date for PT Re-Evaluation 07/23/21    Authorization Type [facility]    PT Start Time 0845    PT Stop Time 0934    PT Time Calculation (min) 49 min             Past Medical History:  Diagnosis Date   Cancer ([place])    OVARIAN   Colonic polyp    Gastritis    Hyperlipidemia    Hypertension    Sjogren's disease ([place])    Stroke ([place])    Thyroid disease    HYPOTHYROIDISM   TIA (transient ischemic attack) 04/06/2021    Past Surgical History:  Procedure Laterality Date   ABDOMINAL HYSTERECTOMY     Ovarian cancer   BIOPSY  02/04/2021   Procedure: BIOPSY;  Surgeon: Ronald Lobo, MD;  Location: [facility];  Service: Endoscopy;;   BREAST CYST ASPIRATION     CESAREAN SECTION     x2   COLONOSCOPY  10-06   Dr. Earnest Bailey   ESOPHAGOGASTRODUODENOSCOPY N/A 02/04/2021   Procedure: ESOPHAGOGASTRODUODENOSCOPY (EGD);  Surgeon: Ronald Lobo, MD;  Location: [facility];  Service: Endoscopy;  Laterality: N/A;   TONSILLECTOMY      There were no vitals filed for this visit.   Subjective Assessment - 07/21/21 0846     Subjective The shoulder is getting better. I've been helping my daughter get her house ready to sell so i have been aggravating it some. I think it would be even better if I was not aggravating it. I have been using ice.    Patient Stated Goals decrease L shoulder pain in order to improve comfort and ADL performance    Currently in Pain? No/denies                [facility] PT Assessment - 07/21/21 0001       AROM   Left Shoulder Flexion 126 Degrees     Left Shoulder ABduction 105 Degrees    Left Shoulder Internal Rotation --   L5 reach            [facility] Adult PT Treatment/Exercise:   Therapeutic Exercise:  UBE lvl 1.0 x 4 min (24fwd/2bwd) while taking subjective Rows 3x12 Blue (not today) Shoulder ext 2x10 Lat pull down standing 15# x10, 10# x 10 L shoulder IR/ER isometric x 10 - 5 sec (not today)  Shoulder IR/ER 2x10 GREEN tband  Total gym row 2x10 25lbs Supine ER w/ scap retrac 2x10 green tband Horizontal abd 3x10 red tband (supine) S/L ER 2x10 2lb (not today)   Manual Therapy:  Grade II-III PA mobs to L shoulder followed by PROM   Modalities:   Cryotherapy left shoulder x 10 minutes       PT Short Term Goals - 07/21/21 0849       PT SHORT TERM GOAL #1   Title Pt will be compliant and knowledgeable with initial HEP for improved carryover and comfort    Baseline initial HEP given    Time 3    Period Weeks    Status  Achieved    Target Date 07/02/21               PT Long Term Goals - 07/21/21 0849       PT LONG TERM GOAL #1   Title Pt will improve FOTO score to no less than 70% function as proxy for functional improvement    Baseline 54% function    Time 6    Period Weeks    Status On-going      PT LONG TERM GOAL #2   Title Pt will improve L shoulder flex/abd to no less than 150 deg for improved ADL performance    Baseline see flowsheet    Time 6    Period Weeks    Status On-going      PT LONG TERM GOAL #3   Title Pt will self report L shoulder pain no greater than 3/10 at worst for improved comfort and function    Baseline 6/10 at worst; 07/21/21 : 5/10 at worst and does not last as long    Time 6    Period Weeks    Status On-going                   Plan - 07/21/21 0926     Clinical Impression Statement Pt reports overall improvement with pain level still reaching 5/10 at worst. Her AROM has improved however still limited compared to right shoulder and with increased pain at end  ranges. Able to progress resistance for standing RTC strengthening bands. Attempted to progress standing scapular bands however this was too difficult for her to maintain good form so continued these is supine. She has increased pain with PROM at end ranges, especially flexion and sbduction. Discussed TPDN and pt is receptive to having this treatment. She will be reassess by primary therapist at her next session.    PT Treatment/Interventions ADLs/Self Care Home Management;Electrical Stimulation;Cryotherapy;Moist Heat;Functional mobility training;Therapeutic activities;Therapeutic exercise;Neuromuscular re-education;Patient/family education;Manual techniques;Passive range of motion;Dry needling;Taping;Vasopneumatic Device    PT Next Visit Plan assess response to HEP, DN for L shoulder?, progress as able, consider adding ionto/ [place] to POC to assist in reducing pain/inflammation.    PT Home Exercise Plan Access Code: [email or web address]             Patient will benefit from skilled therapeutic intervention in order to improve the following deficits and impairments:  Decreased activity tolerance, Decreased endurance, Decreased range of motion, Pain  Visit Diagnosis: Acute pain of left shoulder  Stiffness of left shoulder, not elsewhere classified  Pain in right leg  Other symptoms and signs involving the musculoskeletal system  Muscle weakness (generalized)  Dysphagia, unspecified type     Problem List Patient Active Problem List   Diagnosis Date Noted   Right arm weakness    Paresthesia    Gastritis 02/04/2021   Intractable nausea and vomiting 02/02/2021   Prolonged QT interval 02/02/2021   High anion gap metabolic acidosis 16/06/9603   OSA (obstructive sleep apnea) 09/10/2020   TIA (transient ischemic attack) 04/29/2020   Essential hypertension 04/29/2020   Hormone replacement therapy (HRT) 09/09/2016   Hypothyroidism 07/16/2014   Sjogren's syndrome ([place]) 04/11/2013   History of  ovarian cancer 04/11/2013   Personal history of colonic polyps 04/11/2013   ADD (attention deficit disorder) 07/06/2012   Dysthymia 07/06/2012    Dorene Ar, PTA 07/21/2021, 9:31 AM  [facility] [address] [place], [place], [address] Phone: [phone]   Fax:  [phone]  Name: Dorothy Wood MRN: [id number] Date of Birth: October 19, 1960

## 2021-07-23 ENCOUNTER — Other Ambulatory Visit: Payer: Self-pay

## 2021-07-23 ENCOUNTER — Ambulatory Visit: Payer: 59

## 2021-07-23 DIAGNOSIS — M25512 Pain in left shoulder: Secondary | ICD-10-CM

## 2021-07-23 DIAGNOSIS — R1310 Dysphagia, unspecified: Secondary | ICD-10-CM | POA: Diagnosis not present

## 2021-07-23 DIAGNOSIS — M79604 Pain in right leg: Secondary | ICD-10-CM | POA: Diagnosis not present

## 2021-07-23 DIAGNOSIS — M25612 Stiffness of left shoulder, not elsewhere classified: Secondary | ICD-10-CM

## 2021-07-23 DIAGNOSIS — M6281 Muscle weakness (generalized): Secondary | ICD-10-CM | POA: Diagnosis not present

## 2021-07-23 DIAGNOSIS — R29898 Other symptoms and signs involving the musculoskeletal system: Secondary | ICD-10-CM | POA: Diagnosis not present

## 2021-07-23 NOTE — Therapy (Signed)
[place], [place], [address] Phone: [phone]   Fax:  [phone]  Physical Therapy Treatment  Patient Details  Name: Dorothy Wood MRN: [id number] Date of Birth: 1961/04/01 Referring Provider (PT): Denita Lung, MD   Encounter Date: 07/23/2021   PT End of Session - 07/23/21 1046     Visit Number 5    Number of Visits 17    Date for PT Re-Evaluation 09/03/21    Authorization Type [facility]    PT Start Time 1046    PT Stop Time 1121    PT Time Calculation (min) 35 min    Activity Tolerance Patient tolerated treatment well;No increased pain    Behavior During Therapy WFL for tasks assessed/performed             Past Medical History:  Diagnosis Date   Cancer ([place])    OVARIAN   Colonic polyp    Gastritis    Hyperlipidemia    Hypertension    Sjogren's disease ([place])    Stroke ([place])    Thyroid disease    HYPOTHYROIDISM   TIA (transient ischemic attack) 04/06/2021    Past Surgical History:  Procedure Laterality Date   ABDOMINAL HYSTERECTOMY     Ovarian cancer   BIOPSY  02/04/2021   Procedure: BIOPSY;  Surgeon: Ronald Lobo, MD;  [facility]: [facility];  Service: Endoscopy;;   BREAST CYST ASPIRATION     CESAREAN SECTION     x2   COLONOSCOPY  10-06   Dr. Earnest Bailey   ESOPHAGOGASTRODUODENOSCOPY N/A 02/04/2021   Procedure: ESOPHAGOGASTRODUODENOSCOPY (EGD);  Surgeon: Ronald Lobo, MD;  Location: [facility];  Service: Endoscopy;  Laterality: N/A;   TONSILLECTOMY      There were no vitals filed for this visit.   Subjective Assessment - 07/23/21 1046     Subjective Pt presents to PT with no current reports of pain, unless she moves in a particularly aggravating way. She has been complaint with HEP with no adverse effect. Pt is ready to begin PT treatment at this itme.    Currently in Pain? No/denies    Pain Score 0-No pain           [facility] Adult PT Treatment/Exercise:    Therapeutic Exercise:  UBE lvl 1.0 x 4 min (44fd/2bwd) while taking subjective Rows 3x12 Black tband Shoulder IR/ER 2x15 GREEN tband  Horizontal abd 3x15 green tband (supine) S/L ER 2x10 2lb S/L abductoin L shoulder 2x10 2lb      [facility] PT Assessment - 07/23/21 0001       Observation/Other Assessments   Focus on Therapeutic Outcomes (FOTO)  62% function      AROM   Left Shoulder Flexion 126 Degrees    Left Shoulder ABduction 115 Degrees                                    PT Education - 07/23/21 1121     Education Details HEP update and POC    Person(s) Educated Patient    Methods Explanation;Demonstration;Handout    Comprehension Verbalized understanding;Returned demonstration              PT Short Term Goals - 07/21/21 0849       PT SHORT TERM GOAL #1   Title Pt will be compliant and knowledgeable with initial HEP for improved carryover and comfort    Baseline initial  HEP given    Time 3    Period Weeks    Status Achieved    Target Date 07/02/21               PT Long Term Goals - 07/23/21 1114       PT LONG TERM GOAL #1   Title Pt will improve FOTO score to no less than 70% function as proxy for functional improvement    Baseline 54% function; 62% function on 11/9    Time 6    Period Weeks    Status Partially Met    Target Date 09/03/21      PT LONG TERM GOAL #2   Title Pt will improve L shoulder flex/abd to no less than 150 deg for improved ADL performance    Baseline see flowsheet    Time 6    Period Weeks    Status On-going    Target Date 09/03/21      PT LONG TERM GOAL #3   Title Pt will self report L shoulder pain no greater than 3/10 at worst for improved comfort and function    Baseline 6/10 at worst; 07/21/21 : 5/10 at worst and does not last as long - 3/10 at worst on 11/9    Time 6    Period Weeks    Status Achieved                   Plan - 07/23/21 1122     Clinical Impression Statement  Pt was able to complete all prescribed exercises with no adverse effect and demonstrated knowledge of HEP. Over the course of PT treatment she has been able to improve her strength and ROM of L shoulder. Her FOTO function score has improved since initial evaluation and she generally notes less pain. Pt still has not quite met all LTGs for improving L shoulder functional ability, but would like to try being more compliant with HEP and seeing how she feels in a few weeks. PT will extend her cert POC for 6 additional weeks, with pt calling to schedule back if home maintainence does not change or improve function. Will continue to progress exercises as that time if necessary per POC.    Personal Factors and Comorbidities Comorbidity 3+;Past/Current Experience    Comorbidities PMH: Cancer, Sjogren's disease, CVA, HTN    Examination-Activity Limitations Carry;Reach Overhead;Lift    Examination-Participation Restrictions Occupation;Community Activity;Yard Work    PT Frequency 2x / week    PT Duration 6 weeks    PT Treatment/Interventions ADLs/Self Care Home Management;Electrical Stimulation;Cryotherapy;Moist Heat;Functional mobility training;Therapeutic activities;Therapeutic exercise;Neuromuscular re-education;Patient/family education;Manual techniques;Passive range of motion;Dry needling;Taping;Vasopneumatic Device    PT Next Visit Plan assess response to HEP, DN for L shoulder?, progress as able, consider adding ionto/ [place] to POC to assist in reducing pain/inflammation.    PT Home Exercise Plan Access Code: [email or web address]    Consulted and Agree with Plan of Care Patient             Patient will benefit from skilled therapeutic intervention in order to improve the following deficits and impairments:  Decreased activity tolerance, Decreased endurance, Decreased range of motion, Pain  Visit Diagnosis: Acute pain of left shoulder - Plan: PT plan of care cert/re-cert  Stiffness of left shoulder, not elsewhere  classified - Plan: PT plan of care cert/re-cert     Problem List Patient Active Problem List   Diagnosis Date Noted   Right arm weakness    Paresthesia  Gastritis 02/04/2021   Intractable nausea and vomiting 02/02/2021   Prolonged QT interval 02/02/2021   High anion gap metabolic acidosis 55/37/4827   OSA (obstructive sleep apnea) 09/10/2020   TIA (transient ischemic attack) 04/29/2020   Essential hypertension 04/29/2020   Hormone replacement therapy (HRT) 09/09/2016   Hypothyroidism 07/16/2014   Sjogren's syndrome ([place]) 04/11/2013   History of ovarian cancer 04/11/2013   Personal history of colonic polyps 04/11/2013   ADD (attention deficit disorder) 07/06/2012   Dysthymia 07/06/2012    [name], PT 07/23/2021, 11:26 AM  [facility] [address] [place], [place], [address] Phone: [phone]   Fax:  [phone]  Name: Dorothy Wood MRN: [id number] Date of Birth: [birth year]

## 2021-07-29 ENCOUNTER — Other Ambulatory Visit (HOSPITAL_BASED_OUTPATIENT_CLINIC_OR_DEPARTMENT_OTHER): Payer: Self-pay

## 2021-07-29 MED ORDER — PFIZER COVID-19 VAC BIVALENT 30 MCG/0.3ML IM SUSP
INTRAMUSCULAR | 0 refills | Status: DC
  Filled 2021-07-29: qty 0.3, 1d supply, fill #0

## 2021-08-11 ENCOUNTER — Other Ambulatory Visit (HOSPITAL_COMMUNITY): Payer: Self-pay

## 2021-08-19 ENCOUNTER — Other Ambulatory Visit (HOSPITAL_COMMUNITY): Payer: Self-pay

## 2021-08-20 ENCOUNTER — Encounter: Payer: Self-pay | Admitting: Family Medicine

## 2021-08-20 ENCOUNTER — Other Ambulatory Visit (HOSPITAL_COMMUNITY): Payer: Self-pay

## 2021-08-20 ENCOUNTER — Encounter: Payer: Self-pay | Admitting: Physical Therapy

## 2021-08-25 ENCOUNTER — Other Ambulatory Visit: Payer: Self-pay

## 2021-08-25 ENCOUNTER — Ambulatory Visit: Payer: 59 | Attending: Family Medicine

## 2021-08-25 DIAGNOSIS — M25512 Pain in left shoulder: Secondary | ICD-10-CM | POA: Diagnosis not present

## 2021-08-25 DIAGNOSIS — M25612 Stiffness of left shoulder, not elsewhere classified: Secondary | ICD-10-CM | POA: Insufficient documentation

## 2021-08-25 NOTE — Therapy (Signed)
[place], [place], [address] Phone: [phone]   Fax:  [phone]  Physical Therapy Treatment  Patient Details  Name: Dorothy Wood MRN: [id number] Date of Birth: 11/24/1960 Referring Provider (PT): Denita Lung, MD   Encounter Date: 08/25/2021   PT End of Session - 08/25/21 1459     Visit Number 6    Number of Visits 17    Date for PT Re-Evaluation 09/03/21    Authorization Type [facility]    PT Start Time 3354   arrived late   PT Stop Time 1526    PT Time Calculation (min) 27 min    Activity Tolerance Patient tolerated treatment well;No increased pain    Behavior During Therapy WFL for tasks assessed/performed             Past Medical History:  Diagnosis Date   Cancer ([place])    OVARIAN   Colonic polyp    Gastritis    Hyperlipidemia    Hypertension    Sjogren's disease ([place])    Stroke ([place])    Thyroid disease    HYPOTHYROIDISM   TIA (transient ischemic attack) 04/06/2021    Past Surgical History:  Procedure Laterality Date   ABDOMINAL HYSTERECTOMY     Ovarian cancer   BIOPSY  02/04/2021   Procedure: BIOPSY;  Surgeon: Ronald Lobo, MD;  [facility]: [facility];  Service: Endoscopy;;   BREAST CYST ASPIRATION     CESAREAN SECTION     x2   COLONOSCOPY  10-06   Dr. Earnest Bailey   ESOPHAGOGASTRODUODENOSCOPY N/A 02/04/2021   Procedure: ESOPHAGOGASTRODUODENOSCOPY (EGD);  Surgeon: Ronald Lobo, MD;  Location: [facility];  Service: Endoscopy;  Laterality: N/A;   TONSILLECTOMY      There were no vitals filed for this visit.   Subjective Assessment - 08/25/21 1459     Subjective Pt presents to PT s/p fall onto L shoulder on 08/12/21, which briefly caused sharp incresaes in pain that has slowly been getting better. Pt hasn't been as compliant with HEP secondary to fall. She is ready to begin PT treatment at this time.    Currently in Pain? No/denies    Pain Score 0-No pain            [facility] Adult PT Treatment/Exercise:   Therapeutic Exercise:  Horizontal abd x 10 green tband - seated S/L ER x 10 2lb S/L abductoin L shoulder x 10 2lb Wall slide L shoulder flex/abd x 10 5" ea X arm stretch 2x30" L Towel IR stretch x 30" L  Manual Therapy: N/A   Neuromuscular re-ed: N/A   Therapeutic Activity: N/A   Modalities: N/A   Self Care: N/A   Consider / progression for next session:      [facility] PT Assessment - 08/25/21 0001       AROM   Left Shoulder Flexion 135 Degrees    Left Shoulder ABduction 140 Degrees                                    PT Education - 08/25/21 1528     Education Details HEP update    Person(s) Educated Patient    Methods Explanation;Demonstration;Handout    Comprehension Verbalized understanding;Returned demonstration              PT Short Term Goals - 07/21/21 0849       PT SHORT  TERM GOAL #1   Title Pt will be compliant and knowledgeable with initial HEP for improved carryover and comfort    Baseline initial HEP given    Time 3    Period Weeks    Status Achieved    Target Date 07/02/21               PT Long Term Goals - 07/23/21 1114       PT LONG TERM GOAL #1   Title Pt will improve FOTO score to no less than 70% function as proxy for functional improvement    Baseline 54% function; 62% function on 11/9    Time 6    Period Weeks    Status Partially Met    Target Date 09/03/21      PT LONG TERM GOAL #2   Title Pt will improve L shoulder flex/abd to no less than 150 deg for improved ADL performance    Baseline see flowsheet    Time 6    Period Weeks    Status On-going    Target Date 09/03/21      PT LONG TERM GOAL #3   Title Pt will self report L shoulder pain no greater than 3/10 at worst for improved comfort and function    Baseline 6/10 at worst; 07/21/21 : 5/10 at worst and does not last as long - 3/10 at worst on 11/9    Time 6    Period Weeks     Status Achieved                   Plan - 08/25/21 1526     Clinical Impression Statement Pt was able to complete all prescribed exercises with no adverse effect, although she did have continued L anterior shoulder discomfort, especially moving into IR. HEP updated again today to work on continued stretching of L shoulder, with additional session added with intention to progress exercises and perform TPDN. Will continue ot progress as tolerated per POC.    PT Treatment/Interventions ADLs/Self Care Home Management;Electrical Stimulation;Cryotherapy;Moist Heat;Functional mobility training;Therapeutic activities;Therapeutic exercise;Neuromuscular re-education;Patient/family education;Manual techniques;Passive range of motion;Dry needling;Taping;Vasopneumatic Device    PT Next Visit Plan TPDN to L shoulder complex; assess response to new HEP    PT Home Exercise Plan Access Code: [email or web address]    Consulted and Agree with Plan of Care Patient             Patient will benefit from skilled therapeutic intervention in order to improve the following deficits and impairments:  Decreased activity tolerance, Decreased endurance, Decreased range of motion, Pain  Visit Diagnosis: Acute pain of left shoulder  Stiffness of left shoulder, not elsewhere classified     Problem List Patient Active Problem List   Diagnosis Date Noted   Right arm weakness    Paresthesia    Gastritis 02/04/2021   Intractable nausea and vomiting 02/02/2021   Prolonged QT interval 02/02/2021   High anion gap metabolic acidosis 93/81/8299   OSA (obstructive sleep apnea) 09/10/2020   TIA (transient ischemic attack) 04/29/2020   Essential hypertension 04/29/2020   Hormone replacement therapy (HRT) 09/09/2016   Hypothyroidism 07/16/2014   Sjogren's syndrome ([place]) 04/11/2013   History of ovarian cancer 04/11/2013   Personal history of colonic polyps 04/11/2013   ADD (attention deficit disorder) 07/06/2012    Dysthymia 07/06/2012    [name], PT 08/25/2021, 3:29 PM  [facility] [address] [place], [place], [address] Phone: [phone]  Fax:  [phone]  Name: Dorothy Wood MRN: [id number] Date of Birth: 1961-03-03

## 2021-08-26 ENCOUNTER — Other Ambulatory Visit (HOSPITAL_COMMUNITY): Payer: Self-pay

## 2021-08-26 ENCOUNTER — Other Ambulatory Visit: Payer: Self-pay | Admitting: Cardiology

## 2021-08-26 MED ORDER — HYDROCHLOROTHIAZIDE 25 MG PO TABS
ORAL_TABLET | Freq: Every day | ORAL | 2 refills | Status: DC
Start: 2021-08-26 — End: 2022-04-02
  Filled 2021-08-26: qty 90, 90d supply, fill #0
  Filled 2021-12-07: qty 90, 90d supply, fill #1

## 2021-09-02 ENCOUNTER — Other Ambulatory Visit: Payer: Self-pay

## 2021-09-02 ENCOUNTER — Ambulatory Visit: Payer: 59

## 2021-09-02 DIAGNOSIS — M25512 Pain in left shoulder: Secondary | ICD-10-CM | POA: Diagnosis not present

## 2021-09-02 DIAGNOSIS — M25612 Stiffness of left shoulder, not elsewhere classified: Secondary | ICD-10-CM

## 2021-09-02 NOTE — Therapy (Signed)
[place], [place], [address] Phone: [phone]   Fax:  [phone]  Physical Therapy Treatment  Patient Details  Name: Dorothy Wood MRN: [id number] Date of Birth: May 01, 1961 Referring Provider (PT): Denita Lung, MD   Encounter Date: 09/02/2021   PT End of Session - 09/02/21 1224     Visit Number 7    Number of Visits 17    Date for PT Re-Evaluation 10/28/21    Authorization Type [employer]    PT Start Time 8413   arrived late - first 10 min TPDN   PT Stop Time 1300    PT Time Calculation (min) 25 min    Activity Tolerance Patient tolerated treatment well;No increased pain    Behavior During Therapy WFL for tasks assessed/performed             Past Medical History:  Diagnosis Date   Cancer ([place])    OVARIAN   Colonic polyp    Gastritis    Hyperlipidemia    Hypertension    Sjogren's disease ([place])    Stroke ([place])    Thyroid disease    HYPOTHYROIDISM   TIA (transient ischemic attack) 04/06/2021    Past Surgical History:  Procedure Laterality Date   ABDOMINAL HYSTERECTOMY     Ovarian cancer   BIOPSY  02/04/2021   Procedure: BIOPSY;  Surgeon: Ronald Lobo, MD;  Location: [facility];  Service: Endoscopy;;   BREAST CYST ASPIRATION     CESAREAN SECTION     x2   COLONOSCOPY  10-06   Dr. Earnest Bailey   ESOPHAGOGASTRODUODENOSCOPY N/A 02/04/2021   Procedure: ESOPHAGOGASTRODUODENOSCOPY (EGD);  Surgeon: Ronald Lobo, MD;  Location: [facility];  Service: Endoscopy;  Laterality: N/A;   TONSILLECTOMY      There were no vitals filed for this visit.   Subjective Assessment - 09/02/21 1224     Subjective Pt presents to PT with reports of slight decrease in L shoulder pain. Has been compliant with new HEP with no adverse effect. She is ready to begin PT at this time.    Currently in Pain? No/denies    Pain Score 0-No pain           [facility] Adult PT Treatment/Exercise:    Therapeutic Exercise:  UBE lvl 1.5 x 2 min fwd while taking subjective L shoulder IR/ER 2x10 RTB Bilat ER w/ scap retrac 2x10 YTB Pulley into R shoulder IR x 2 min Body blade x 30" IR/ER L   Trigger Point Dry Needling Treatment: Pre-treatment instruction: Patient instructed on dry needling rationale, procedures, and possible side effects including pain during treatment (achy,cramping feeling), bruising, drop of blood, lightheadedness, nausea, sweating. Patient Consent Given: Yes Education handout provided: No Muscles treated: L anterior/middle deltoid; L subscapulairs  Needle size and number: .25x31m x 2 Electrical stimulation performed: No Parameters: N/A Treatment response/outcome: Palpable decrease in muscle tension Post-treatment instructions: Patient instructed to expect possible mild to moderate muscle soreness later today and/or tomorrow. Patient instructed in methods to reduce muscle soreness and to continue prescribed HEP. If patient was dry needled over the lung field, patient was instructed on signs and symptoms of pneumothorax and, however unlikely, to see immediate medical attention should they occur. Patient was also educated on signs and symptoms of infection and to seek medical attention should they occur. Patient verbalized understanding of these instructions and education.  PT Short Term Goals - 07/21/21 0849       PT SHORT TERM GOAL #1   Title Pt will be compliant and knowledgeable with initial HEP for improved carryover and comfort    Baseline initial HEP given    Time 3    Period Weeks    Status Achieved    Target Date 07/02/21               PT Long Term Goals - 09/02/21 1431       PT LONG TERM GOAL #1   Title Pt will improve FOTO score to no less than 70% function as proxy for functional improvement    Baseline 54% function; 62% function on 11/9    Time 6    Period Weeks    Status Partially Met     Target Date 10/28/21      PT LONG TERM GOAL #2   Title Pt will improve L shoulder flex/abd to no less than 150 deg for improved ADL performance    Baseline see flowsheet    Time 6    Period Weeks    Status On-going    Target Date 10/28/21      PT LONG TERM GOAL #3   Title Pt will self report L shoulder pain no greater than 3/10 at worst for improved comfort and function    Baseline 6/10 at worst; 07/21/21 : 5/10 at worst and does not last as long - 3/10 at worst on 11/9    Time 6    Period Weeks    Status Achieved                   Plan - 09/02/21 1421     Clinical Impression Statement Pt was able to complete all prescribed exercises with no adverse effect or increase in pain. She responded well to TPDN, but could not lie prone with L arm in IR for subscapularis d/t increased R shoulder pain. Therapy today also focused on improving L RTC strength and stability in order to decrease pain and impingement. PT will continue to progress as tolerated with POC extension for few addtional sessions, focused on DN, manual stretching, and continued RTC strengthening.    PT Frequency Biweekly    PT Duration 8 weeks    PT Treatment/Interventions ADLs/Self Care Home Management;Electrical Stimulation;Cryotherapy;Moist Heat;Functional mobility training;Therapeutic activities;Therapeutic exercise;Neuromuscular re-education;Patient/family education;Manual techniques;Passive range of motion;Dry needling;Taping;Vasopneumatic Device    PT Next Visit Plan TPDN to L shoulder complex, progress RTC and periscapular strengthening; assess response to HEP    PT Home Exercise Plan Access Code: [email or web address]    Consulted and Agree with Plan of Care Patient             Patient will benefit from skilled therapeutic intervention in order to improve the following deficits and impairments:  Decreased activity tolerance, Decreased endurance, Decreased range of motion, Pain  Visit Diagnosis: Acute pain of left  shoulder - Plan: PT plan of care cert/re-cert  Stiffness of left shoulder, not elsewhere classified - Plan: PT plan of care cert/re-cert     Problem List Patient Active Problem List   Diagnosis Date Noted   Right arm weakness    Paresthesia    Gastritis 02/04/2021   Intractable nausea and vomiting 02/02/2021   Prolonged QT interval 02/02/2021   High anion gap metabolic acidosis 82/50/0370   OSA (obstructive sleep apnea) 09/10/2020   TIA (transient ischemic attack) 04/29/2020   Essential hypertension 04/29/2020  Hormone replacement therapy (HRT) 09/09/2016   Hypothyroidism 07/16/2014   Sjogren's syndrome ([place]) 04/11/2013   History of ovarian cancer 04/11/2013   Personal history of colonic polyps 04/11/2013   ADD (attention deficit disorder) 07/06/2012   Dysthymia 07/06/2012    [name], PT 09/02/2021, 2:37 PM  [facility] [address] [place], [place], [address] Phone: [phone]   Fax:  [phone]  Name: Dorothy Wood MRN: [id number] Date of Birth: 1960/10/18

## 2021-09-10 ENCOUNTER — Ambulatory Visit: Payer: 59

## 2021-09-14 ENCOUNTER — Other Ambulatory Visit (HOSPITAL_COMMUNITY): Payer: Self-pay

## 2021-09-15 ENCOUNTER — Other Ambulatory Visit (HOSPITAL_COMMUNITY): Payer: Self-pay

## 2021-09-16 ENCOUNTER — Ambulatory Visit: Payer: 59 | Attending: Family Medicine

## 2021-09-16 ENCOUNTER — Other Ambulatory Visit: Payer: Self-pay

## 2021-09-16 DIAGNOSIS — M25612 Stiffness of left shoulder, not elsewhere classified: Secondary | ICD-10-CM | POA: Diagnosis not present

## 2021-09-16 DIAGNOSIS — M25512 Pain in left shoulder: Secondary | ICD-10-CM | POA: Diagnosis not present

## 2021-09-16 NOTE — Therapy (Signed)
[place], [place], [address] Phone: [phone]   Fax:  [phone]  Physical Therapy Treatment  Patient Details  Name: Dorothy Wood MRN: [id number] Date of Birth: [birth year] Referring Provider (PT): Denita Lung, MD   Encounter Date: 09/16/2021   PT End of Session - 09/16/21 1620     Visit Number 8    Number of Visits 17    Date for PT Re-Evaluation 10/28/21    Authorization Type [employer]    PT Start Time 1620    PT Stop Time 1658   2 minutes taken out for TPDN   PT Time Calculation (min) 38 min    Activity Tolerance Patient tolerated treatment well;No increased pain    Behavior During Therapy WFL for tasks assessed/performed             Past Medical History:  Diagnosis Date   Cancer ([place])    OVARIAN   Colonic polyp    Gastritis    Hyperlipidemia    Hypertension    Sjogren's disease ([place])    Stroke ([place])    Thyroid disease    HYPOTHYROIDISM   TIA (transient ischemic attack) 04/06/2021    Past Surgical History:  Procedure Laterality Date   ABDOMINAL HYSTERECTOMY     Ovarian cancer   BIOPSY  02/04/2021   Procedure: BIOPSY;  Surgeon: Ronald Lobo, MD;  Location: [facility];  Service: Endoscopy;;   BREAST CYST ASPIRATION     CESAREAN SECTION     x2   COLONOSCOPY  10-06   Dr. Earnest Bailey   ESOPHAGOGASTRODUODENOSCOPY N/A 02/04/2021   Procedure: ESOPHAGOGASTRODUODENOSCOPY (EGD);  Surgeon: Ronald Lobo, MD;  Location: [facility];  Service: Endoscopy;  Laterality: N/A;   TONSILLECTOMY      There were no vitals filed for this visit.   Subjective Assessment - 09/16/21 1620     Subjective Pt presents to PT with report no reports of L shoulder pain or discomfort. Has continued compliant with HEP with no adverse effect. Pt is ready to begin PT treatment at this time.    Currently in Pain? No/denies    Pain Score 0-No pain           [facility] Adult PT Treatment/Exercise:    Therapeutic Exercise:  UBE lvl 1.5 x 4 min while taking subjective L shoulder IR/ER 2x10 RTB Bilat ER w/ scap retrac 2x10 YTB Pulley into R shoulder IR x 2 min S/L ER 2x10 3lbs  S/L L shoulder abd 2x10 3lb  Manual Therapy: PROM into L shoulder IR/ER  Trigger Point Dry Needling Treatment: Pre-treatment instruction: Patient instructed on dry needling rationale, procedures, and possible side effects including pain during treatment (achy,cramping feeling), bruising, drop of blood, lightheadedness, nausea, sweating. Patient Consent Given: Yes Education handout provided: No Muscles treated: L subscapularis  Needle size and number: .30x8m x 2 Electrical stimulation performed: No Parameters: N/A Treatment response/outcome: Palpable decrease in muscle tension Post-treatment instructions: Patient instructed to expect possible mild to moderate muscle soreness later today and/or tomorrow. Patient instructed in methods to reduce muscle soreness and to continue prescribed HEP. If patient was dry needled over the lung field, patient was instructed on signs and symptoms of pneumothorax and, however unlikely, to see immediate medical attention should they occur. Patient was also educated on signs and symptoms of infection and to seek medical attention should they occur. Patient verbalized understanding of these instructions and education.  PT Short Term Goals - 07/21/21 0849       PT SHORT TERM GOAL #1   Title Pt will be compliant and knowledgeable with initial HEP for improved carryover and comfort    Baseline initial HEP given    Time 3    Period Weeks    Status Achieved    Target Date 07/02/21               PT Long Term Goals - 09/02/21 1431       PT LONG TERM GOAL #1   Title Pt will improve FOTO score to no less than 70% function as proxy for functional improvement    Baseline 54% function; 62% function on 11/9    Time 6     Period Weeks    Status Partially Met    Target Date 10/28/21      PT LONG TERM GOAL #2   Title Pt will improve L shoulder flex/abd to no less than 150 deg for improved ADL performance    Baseline see flowsheet    Time 6    Period Weeks    Status On-going    Target Date 10/28/21      PT LONG TERM GOAL #3   Title Pt will self report L shoulder pain no greater than 3/10 at worst for improved comfort and function    Baseline 6/10 at worst; 07/21/21 : 5/10 at worst and does not last as long - 3/10 at worst on 11/9    Time 6    Period Weeks    Status Achieved                   Plan - 09/16/21 1700     Clinical Impression Statement Pt was able to complete all prescribed exercises with no adverse effect or increase in pain. She responded well to therapy today, with session focusing on improving RTC and scapular stabilizer strength along with L shoulder ROM. She continues to progress well with therapy, demonstrating improved IR of L shoulder post session. PT will continue to progress pt as tolerated per POC.    PT Treatment/Interventions ADLs/Self Care Home Management;Electrical Stimulation;Cryotherapy;Moist Heat;Functional mobility training;Therapeutic activities;Therapeutic exercise;Neuromuscular re-education;Patient/family education;Manual techniques;Passive range of motion;Dry needling;Taping;Vasopneumatic Device    PT Next Visit Plan TPDN to L shoulder complex, progress RTC and periscapular strengthening; assess response to HEP    PT Home Exercise Plan Access Code: [facility]             Patient will benefit from skilled therapeutic intervention in order to improve the following deficits and impairments:  Decreased activity tolerance, Decreased endurance, Decreased range of motion, Pain  Visit Diagnosis: Acute pain of left shoulder  Stiffness of left shoulder, not elsewhere classified     Problem List Patient Active Problem List   Diagnosis Date Noted   Right arm  weakness    Paresthesia    Gastritis 02/04/2021   Intractable nausea and vomiting 02/02/2021   Prolonged QT interval 02/02/2021   High anion gap metabolic acidosis 33/35/4562   OSA (obstructive sleep apnea) 09/10/2020   TIA (transient ischemic attack) 04/29/2020   Essential hypertension 04/29/2020   Hormone replacement therapy (HRT) 09/09/2016   Hypothyroidism 07/16/2014   Sjogren's syndrome ([place]) 04/11/2013   History of ovarian cancer 04/11/2013   Personal history of colonic polyps 04/11/2013   ADD (attention deficit disorder) 07/06/2012   Dysthymia 07/06/2012    [name], PT 09/16/2021, 5:09 PM  [facility] [address] [place], [place], [address] Phone: [phone]   Fax:  [phone]  Name: Dorothy Wood MRN: [id number] Date of Birth: [birth year]

## 2021-09-22 ENCOUNTER — Other Ambulatory Visit (HOSPITAL_COMMUNITY): Payer: Self-pay

## 2021-09-23 ENCOUNTER — Other Ambulatory Visit (HOSPITAL_COMMUNITY): Payer: Self-pay

## 2021-09-23 MED ORDER — MYRBETRIQ 50 MG PO TB24
50.0000 mg | ORAL_TABLET | Freq: Every day | ORAL | 1 refills | Status: DC
  Filled 2021-09-23: qty 30, 30d supply, fill #0
  Filled 2021-10-17: qty 30, 30d supply, fill #1

## 2021-09-24 ENCOUNTER — Other Ambulatory Visit (HOSPITAL_COMMUNITY): Payer: Self-pay

## 2021-09-30 ENCOUNTER — Other Ambulatory Visit: Payer: Self-pay

## 2021-09-30 ENCOUNTER — Ambulatory Visit: Payer: 59

## 2021-09-30 DIAGNOSIS — M25512 Pain in left shoulder: Secondary | ICD-10-CM

## 2021-09-30 DIAGNOSIS — M25612 Stiffness of left shoulder, not elsewhere classified: Secondary | ICD-10-CM | POA: Diagnosis not present

## 2021-09-30 NOTE — Therapy (Signed)
[place], [place], [address] Phone: [phone]   Fax:  [phone]  Physical Therapy Treatment  Patient Details  Name: Dorothy Wood MRN: [id number] Date of Birth: Nov 24, 1960 Referring Provider (PT): Denita Lung, MD   Encounter Date: 09/30/2021   PT End of Session - 09/30/21 1448     Visit Number 9    Number of Visits 17    Date for PT Re-Evaluation 10/28/21    Authorization Type [employer]    PT Start Time 4166    PT Stop Time 1526    PT Time Calculation (min) 38 min    Activity Tolerance Patient tolerated treatment well;No increased pain    Behavior During Therapy WFL for tasks assessed/performed             Past Medical History:  Diagnosis Date   Cancer ([place])    OVARIAN   Colonic polyp    Gastritis    Hyperlipidemia    Hypertension    Sjogren's disease ([place])    Stroke ([place])    Thyroid disease    HYPOTHYROIDISM   TIA (transient ischemic attack) 04/06/2021    Past Surgical History:  Procedure Laterality Date   ABDOMINAL HYSTERECTOMY     Ovarian cancer   BIOPSY  02/04/2021   Procedure: BIOPSY;  Surgeon: Ronald Lobo, MD;  Location: [facility];  Service: Endoscopy;;   BREAST CYST ASPIRATION     CESAREAN SECTION     x2   COLONOSCOPY  10-06   Dr. Earnest Bailey   ESOPHAGOGASTRODUODENOSCOPY N/A 02/04/2021   Procedure: ESOPHAGOGASTRODUODENOSCOPY (EGD);  Surgeon: Ronald Lobo, MD;  Location: [facility];  Service: Endoscopy;  Laterality: N/A;   TONSILLECTOMY      There were no vitals filed for this visit.   Subjective Assessment - 09/30/21 1449     Subjective Pt presents to PT with reports of slight L shoulder pain. She notes that she went to a massage therapist earlier today and feels it improved her ROM a good bit. She is ready to begin PT treatment at this time.    Currently in Pain? Yes    Pain Score 1     Pain Location Shoulder    Pain Orientation Left            [facility] Adult PT Treatment/Exercise:   Therapeutic Exercise:  UBE lvl 1.5 x 4 min while taking subjective Row 3x12 17lbs L shoulder IR 2x10 7lb L shoulder ER 2x10 3lb Bilat ER w/ scap retrac 2x10 YTB Supine horizontal abd 3x10 GTB Pulley into R shoulder IR x 2 min   Manual Therapy: PROM into L shoulder IR/ER Traction to L shoulder STM to L biceps tendon                               PT Short Term Goals - 07/21/21 0849       PT SHORT TERM GOAL #1   Title Pt will be compliant and knowledgeable with initial HEP for improved carryover and comfort    Baseline initial HEP given    Time 3    Period Weeks    Status Achieved    Target Date 07/02/21               PT Long Term Goals - 09/02/21 1431       PT LONG TERM GOAL #1   Title Pt will improve FOTO  score to no less than 70% function as proxy for functional improvement    Baseline 54% function; 62% function on 11/9    Time 6    Period Weeks    Status Partially Met    Target Date 10/28/21      PT LONG TERM GOAL #2   Title Pt will improve L shoulder flex/abd to no less than 150 deg for improved ADL performance    Baseline see flowsheet    Time 6    Period Weeks    Status On-going    Target Date 10/28/21      PT LONG TERM GOAL #3   Title Pt will self report L shoulder pain no greater than 3/10 at worst for improved comfort and function    Baseline 6/10 at worst; 07/21/21 : 5/10 at worst and does not last as long - 3/10 at worst on 11/9    Time 6    Period Weeks    Status Achieved                   Plan - 09/30/21 1528     Clinical Impression Statement Pt was able to complete all prescribed exercises with no adverse effect or increase in pain. She responded well to therapy today, with session focusing on improving RTC and scapular stabilizer strength along with L shoulder ROM. She continues to progress well with therapy, demonstrating continued improvement in L shoulder ROM.  Pt does continue to have L shoulder, specifically biceps tendon and ER pain. PT will continue to progress pt as tolerated per POC.    PT Treatment/Interventions ADLs/Self Care Home Management;Electrical Stimulation;Cryotherapy;Moist Heat;Functional mobility training;Therapeutic activities;Therapeutic exercise;Neuromuscular re-education;Patient/family education;Manual techniques;Passive range of motion;Dry needling;Taping;Vasopneumatic Device    PT Next Visit Plan TPDN to L shoulder complex, progress RTC and periscapular strengthening; assess response to HEP    PT Home Exercise Plan Access Code: [facility]             Patient will benefit from skilled therapeutic intervention in order to improve the following deficits and impairments:  Decreased activity tolerance, Decreased endurance, Decreased range of motion, Pain  Visit Diagnosis: Acute pain of left shoulder     Problem List Patient Active Problem List   Diagnosis Date Noted   Right arm weakness    Paresthesia    Gastritis 02/04/2021   Intractable nausea and vomiting 02/02/2021   Prolonged QT interval 02/02/2021   High anion gap metabolic acidosis 76/80/8811   OSA (obstructive sleep apnea) 09/10/2020   TIA (transient ischemic attack) 04/29/2020   Essential hypertension 04/29/2020   Hormone replacement therapy (HRT) 09/09/2016   Hypothyroidism 07/16/2014   Sjogren's syndrome ([place]) 04/11/2013   History of ovarian cancer 04/11/2013   Personal history of colonic polyps 04/11/2013   ADD (attention deficit disorder) 07/06/2012   Dysthymia 07/06/2012    [name], PT 09/30/2021, 3:35 PM  [facility] [address] [place], [place], [address] Phone: [phone]   Fax:  [phone]  Name: DUNG PRIEN MRN: [id number] Date of Birth: [birth year]

## 2021-10-09 ENCOUNTER — Other Ambulatory Visit: Payer: Self-pay | Admitting: Family Medicine

## 2021-10-09 ENCOUNTER — Other Ambulatory Visit (HOSPITAL_COMMUNITY): Payer: Self-pay

## 2021-10-09 MED ORDER — ATORVASTATIN CALCIUM 80 MG PO TABS
80.0000 mg | ORAL_TABLET | Freq: Every day | ORAL | 0 refills | Status: DC
  Filled 2021-10-09: qty 90, 90d supply, fill #0

## 2021-10-14 ENCOUNTER — Other Ambulatory Visit: Payer: Self-pay

## 2021-10-14 ENCOUNTER — Ambulatory Visit: Payer: 59

## 2021-10-14 DIAGNOSIS — M25612 Stiffness of left shoulder, not elsewhere classified: Secondary | ICD-10-CM | POA: Diagnosis not present

## 2021-10-14 DIAGNOSIS — M25512 Pain in left shoulder: Secondary | ICD-10-CM | POA: Diagnosis not present

## 2021-10-14 NOTE — Therapy (Signed)
[place], [place], [address] Phone: [phone]   Fax:  [phone]  Physical Therapy Treatment  Patient Details  Name: Dorothy Wood MRN: [id number] Date of Birth: May 17, 1961 Referring Provider (PT): Denita Lung, MD   Encounter Date: 10/14/2021   PT End of Session - 10/14/21 1451     Visit Number 10    Number of Visits 17    Date for PT Re-Evaluation 10/28/21    Authorization Type [employer]    PT Start Time 1451    PT Stop Time 1530   4 min take away from total time for TPDN   PT Time Calculation (min) 39 min    Activity Tolerance Patient tolerated treatment well;No increased pain    Behavior During Therapy WFL for tasks assessed/performed             Past Medical History:  Diagnosis Date   Cancer ([place])    OVARIAN   Colonic polyp    Gastritis    Hyperlipidemia    Hypertension    Sjogren's disease ([place])    Stroke ([place])    Thyroid disease    HYPOTHYROIDISM   TIA (transient ischemic attack) 04/06/2021    Past Surgical History:  Procedure Laterality Date   ABDOMINAL HYSTERECTOMY     Ovarian cancer   BIOPSY  02/04/2021   Procedure: BIOPSY;  Surgeon: Ronald Lobo, MD;  Location: [facility];  Service: Endoscopy;;   BREAST CYST ASPIRATION     CESAREAN SECTION     x2   COLONOSCOPY  10-06   Dr. Earnest Bailey   ESOPHAGOGASTRODUODENOSCOPY N/A 02/04/2021   Procedure: ESOPHAGOGASTRODUODENOSCOPY (EGD);  Surgeon: Ronald Lobo, MD;  Location: [facility];  Service: Endoscopy;  Laterality: N/A;   TONSILLECTOMY      There were no vitals filed for this visit.   Subjective Assessment - 10/14/21 1451     Subjective Pt presents to PT with reports of no shoulder pain at rest. Has continued pain with reaching her L UE behind back. She has not been as compliant with HEP as she would want due to work schedule. She is ready to begin PT at this time.    Currently in Pain? No/denies    Pain  Score 0-No pain           [facility] Adult PT Treatment/Exercise:   Therapeutic Exercise:  UBE lvl 1.5 x 3 min while taking subjective Row 3x12 17lbs L shoulder IR 2x10 7lb L shoulder ER 2x10 3lb Bilat ER w/ scap retrac 3x10 RTB Seated horizontal abd 2x15 GTB Pulley into R shoulder IR x 2 min Anterior lat pulldown 2x10 20lbs 90/90 carry 5lb DB 2x80f   Trigger Point Dry Needling Treatment: Pre-treatment instruction: Patient instructed on dry needling rationale, procedures, and possible side effects including pain during treatment (achy,cramping feeling), bruising, drop of blood, lightheadedness, nausea, sweating. Patient Consent Given: Yes Education handout provided: No Muscles treated: Left infraspinatus, Left subscapularis   Needle size and number: .25x318mx 3 Electrical stimulation performed: No Parameters: N/A Treatment response/outcome: Twitch response elicited and Palpable decrease in muscle tension Post-treatment instructions: Patient instructed to expect possible mild to moderate muscle soreness later today and/or tomorrow. Patient instructed in methods to reduce muscle soreness and to continue prescribed HEP. If patient was dry needled over the lung field, patient was instructed on signs and symptoms of pneumothorax and, however unlikely, to see immediate medical attention should they occur. Patient was also educated on  signs and symptoms of infection and to seek medical attention should they occur. Patient verbalized understanding of these instructions and education.                              PT Short Term Goals - 07/21/21 0849       PT SHORT TERM GOAL #1   Title Pt will be compliant and knowledgeable with initial HEP for improved carryover and comfort    Baseline initial HEP given    Time 3    Period Weeks    Status Achieved    Target Date 07/02/21               PT Long Term Goals - 09/02/21 1431       PT LONG TERM GOAL #1   Title  Pt will improve FOTO score to no less than 70% function as proxy for functional improvement    Baseline 54% function; 62% function on 11/9    Time 6    Period Weeks    Status Partially Met    Target Date 10/28/21      PT LONG TERM GOAL #2   Title Pt will improve L shoulder flex/abd to no less than 150 deg for improved ADL performance    Baseline see flowsheet    Time 6    Period Weeks    Status On-going    Target Date 10/28/21      PT LONG TERM GOAL #3   Title Pt will self report L shoulder pain no greater than 3/10 at worst for improved comfort and function    Baseline 6/10 at worst; 07/21/21 : 5/10 at worst and does not last as long - 3/10 at worst on 11/9    Time 6    Period Weeks    Status Achieved                   Plan - 10/14/21 1846     Clinical Impression Statement Pt was able to complete prescribed exercises with no adverse effect. She again responded well to TPDN, noting decreased muscle tension post session. Therapy today continued to also focus on RTC strengthening and improving L shoulder ROM. PT will assess goals at next session with possible discharge as appropriate.    PT Treatment/Interventions ADLs/Self Care Home Management;Electrical Stimulation;Cryotherapy;Moist Heat;Functional mobility training;Therapeutic activities;Therapeutic exercise;Neuromuscular re-education;Patient/family education;Manual techniques;Passive range of motion;Dry needling;Taping;Vasopneumatic Device    PT Next Visit Plan assess goals and discharge readiness    PT Home Exercise Plan Access Code: [facility]             Patient will benefit from skilled therapeutic intervention in order to improve the following deficits and impairments:  Decreased activity tolerance, Decreased endurance, Decreased range of motion, Pain  Visit Diagnosis: Acute pain of left shoulder  Stiffness of left shoulder, not elsewhere classified     Problem List Patient Active Problem List   Diagnosis  Date Noted   Right arm weakness    Paresthesia    Gastritis 02/04/2021   Intractable nausea and vomiting 02/02/2021   Prolonged QT interval 02/02/2021   High anion gap metabolic acidosis 97/35/3299   OSA (obstructive sleep apnea) 09/10/2020   TIA (transient ischemic attack) 04/29/2020   Essential hypertension 04/29/2020   Hormone replacement therapy (HRT) 09/09/2016   Hypothyroidism 07/16/2014   Sjogren's syndrome ([place]) 04/11/2013   History of ovarian cancer 04/11/2013   Personal history of  colonic polyps 04/11/2013   ADD (attention deficit disorder) 07/06/2012   Dysthymia 07/06/2012    [name], PT 10/14/2021, 6:49 PM  [facility] [address] [place], [place], [address] Phone: [phone]   Fax:  [phone]  Name: Dorothy Wood MRN: [id number] Date of Birth: 01/26/1961

## 2021-10-16 ENCOUNTER — Other Ambulatory Visit (HOSPITAL_COMMUNITY): Payer: Self-pay

## 2021-10-16 MED ORDER — CARESTART COVID-19 HOME TEST VI KIT
PACK | 0 refills | Status: DC
  Filled 2021-10-16: qty 4, 4d supply, fill #0

## 2021-10-17 ENCOUNTER — Other Ambulatory Visit (HOSPITAL_COMMUNITY): Payer: Self-pay

## 2021-10-20 ENCOUNTER — Other Ambulatory Visit (HOSPITAL_COMMUNITY): Payer: Self-pay

## 2021-10-21 ENCOUNTER — Other Ambulatory Visit: Payer: Self-pay

## 2021-10-21 ENCOUNTER — Ambulatory Visit: Payer: 59 | Attending: Family Medicine

## 2021-10-21 DIAGNOSIS — M25612 Stiffness of left shoulder, not elsewhere classified: Secondary | ICD-10-CM | POA: Diagnosis not present

## 2021-10-21 DIAGNOSIS — M25512 Pain in left shoulder: Secondary | ICD-10-CM

## 2021-10-21 NOTE — Therapy (Signed)
**Note Dorothy-Identified via Obfuscation** Dorothy Wood, [place], [address] Phone: [phone]   Fax:  [phone]  Physical Therapy Treatment/Discharge  Patient Details  Name: Dorothy Wood MRN: [id number] Date of Birth: Apr 21, 1961 Referring Provider (PT): Denita Lung, MD   Encounter Date: 10/21/2021   PT End of Session - 10/21/21 1452     Visit Number 11    Number of Visits 17    Date for PT Re-Evaluation 10/28/21    Authorization Type [employer]    PT Start Time 8841    PT Stop Time 1518    PT Time Calculation (min) 25 min    Activity Tolerance Patient tolerated treatment well;No increased pain    Behavior During Therapy WFL for tasks assessed/performed             Past Medical History:  Diagnosis Date   Cancer ([place])    OVARIAN   Colonic polyp    Gastritis    Hyperlipidemia    Hypertension    Sjogren's disease ([place])    Stroke ([place])    Thyroid disease    HYPOTHYROIDISM   TIA (transient ischemic attack) 04/06/2021    Past Surgical History:  Procedure Laterality Date   ABDOMINAL HYSTERECTOMY     Ovarian cancer   BIOPSY  02/04/2021   Procedure: BIOPSY;  Surgeon: Ronald Lobo, MD;  Location: [facility];  Service: Endoscopy;;   BREAST CYST ASPIRATION     CESAREAN SECTION     x2   COLONOSCOPY  10-06   Dr. Earnest Bailey   ESOPHAGOGASTRODUODENOSCOPY N/A 02/04/2021   Procedure: ESOPHAGOGASTRODUODENOSCOPY (EGD);  Surgeon: Ronald Lobo, MD;  Location: [facility];  Service: Endoscopy;  Laterality: N/A;   TONSILLECTOMY      There were no vitals filed for this visit.   Subjective Assessment - 10/21/21 1453     Subjective Pt presents to PT with no report sof shoulder pain or discomfort. Has been still compliant with HEP with no adverse effect. Pt is ready to begin PT at this time.    Currently in Pain? No/denies    Pain Score 0-No pain           [facility] Adult PT Treatment/Exercise:   Therapeutic Exercise:  UBE lvl  1.5 x 3 min while taking subjective Row x 15 black TB L shoulder IR/ER GTB x 15 L shoulder flex/abd wall slide x 10 - %" Sidelying ER x 10 L 3# Sidelying abd x 10 3# L shoulder towel IR stretch x 45" Therapeutic Activity: Assessment of tests/measures, goals, and outcomes for discharge assessment                             PT Education - 10/21/21 1522     Education Details HEP and discharge plan    Person(s) Educated Patient    Methods Explanation;Demonstration;Handout    Comprehension Verbalized understanding;Returned demonstration              PT Short Term Goals - 07/21/21 0849       PT SHORT TERM GOAL #1   Title Pt will be compliant and knowledgeable with initial HEP for improved carryover and comfort    Baseline initial HEP given    Time 3    Period Weeks    Status Achieved    Target Date 07/02/21               PT Long Term Goals -  10/21/21 1459       PT LONG TERM GOAL #1   Title Pt will improve FOTO score to no less than 70% function as proxy for functional improvement    Baseline 54% function; 62% function on 11/9    Time 6    Period Weeks    Status Achieved    Target Date 10/28/21      PT LONG TERM GOAL #2   Title Pt will improve L shoulder flex/abd to no less than 150 deg for improved ADL performance    Baseline see flowsheet    Time 6    Period Weeks    Status Achieved    Target Date 10/28/21      PT LONG TERM GOAL #3   Title Pt will self report L shoulder pain no greater than 3/10 at worst for improved comfort and function    Baseline 6/10 at worst; 07/21/21 : 5/10 at worst and does not last as long - 3/10 at worst on 11/9    Time 6    Period Weeks    Status Achieved                   Plan - 10/21/21 1519     Clinical Impression Statement Pt was able to complete all prescribed exercises with no adverse effect. Over the course of PT treatment, she has met all LTGs and progressed well with therapy. Pt  has met FOTO goal for subjective improvement in functional ability and increased her L shoulder AROM in all planes, showing improved functional ability with ADLs. She should continue to improve with HEP compliance and is being discharged at this time with this in place. Pt in agreement with current plan and is ready to discharge at this time.    PT Treatment/Interventions ADLs/Self Care Home Management;Electrical Stimulation;Cryotherapy;Moist Heat;Functional mobility training;Therapeutic activities;Therapeutic exercise;Neuromuscular re-education;Patient/family education;Manual techniques;Passive range of motion;Dry needling;Taping;Vasopneumatic Device    PT Home Exercise Plan Access Code: [email or web address]    Consulted and Agree with Plan of Care Patient             Patient will benefit from skilled therapeutic intervention in order to improve the following deficits and impairments:  Decreased activity tolerance, Decreased endurance, Decreased range of motion, Pain  Visit Diagnosis: Acute pain of left shoulder  Stiffness of left shoulder, not elsewhere classified     Problem List Patient Active Problem List   Diagnosis Date Noted   Right arm weakness    Paresthesia    Gastritis 02/04/2021   Intractable nausea and vomiting 02/02/2021   Prolonged QT interval 02/02/2021   High anion gap metabolic acidosis 47/05/6282   OSA (obstructive sleep apnea) 09/10/2020   TIA (transient ischemic attack) 04/29/2020   Essential hypertension 04/29/2020   Hormone replacement therapy (HRT) 09/09/2016   Hypothyroidism 07/16/2014   Sjogren's syndrome ([place]) 04/11/2013   History of ovarian cancer 04/11/2013   Personal history of colonic polyps 04/11/2013   ADD (attention deficit disorder) 07/06/2012   Dysthymia 07/06/2012    [name], PT 10/21/2021, 3:23 PM  [facility] [address] [place], [place], [address] Phone: [phone]   Fax:   [phone]  Name: Dorothy Wood MRN: [id number] Date of Birth: [birth year]  PHYSICAL THERAPY DISCHARGE SUMMARY  Visits from Start of Care: 11  Current functional level related to goals / functional outcomes: See goals and objective   Remaining deficits: See goals and objective   Education / Equipment: HEP  Patient agrees to discharge. Patient goals were met. Patient is being discharged due to meeting the stated rehab goals.

## 2021-10-22 ENCOUNTER — Other Ambulatory Visit (HOSPITAL_COMMUNITY): Payer: Self-pay

## 2021-10-23 ENCOUNTER — Other Ambulatory Visit (HOSPITAL_COMMUNITY): Payer: Self-pay

## 2021-10-24 ENCOUNTER — Other Ambulatory Visit (HOSPITAL_COMMUNITY): Payer: Self-pay

## 2021-10-24 ENCOUNTER — Other Ambulatory Visit (HOSPITAL_BASED_OUTPATIENT_CLINIC_OR_DEPARTMENT_OTHER): Payer: Self-pay

## 2021-10-24 MED FILL — Pilocarpine HCl Tab 5 MG: ORAL | 90 days supply | Qty: 360 | Fill #0 | Status: AC

## 2021-10-25 ENCOUNTER — Other Ambulatory Visit (HOSPITAL_COMMUNITY): Payer: Self-pay

## 2021-10-27 ENCOUNTER — Other Ambulatory Visit (HOSPITAL_COMMUNITY): Payer: Self-pay

## 2021-10-28 ENCOUNTER — Other Ambulatory Visit (HOSPITAL_COMMUNITY): Payer: Self-pay

## 2021-10-28 MED ORDER — LORAZEPAM 1 MG PO TABS
ORAL_TABLET | ORAL | 1 refills | Status: DC
  Filled 2021-10-28: qty 30, 30d supply, fill #0
  Filled 2021-12-02: qty 30, 30d supply, fill #1

## 2021-10-29 NOTE — Progress Notes (Unsigned)
HPI:FU hypertension and family h/o CAD.  Calcium score February 2021 0. Brain MRI/MRA August 2021 normal.  Echocardiogram July 2022 showed normal LV function.  Since last seen,   Current Outpatient Medications  Medication Sig Dispense Refill   aspirin EC 81 MG tablet Take 81 mg by mouth every morning. Swallow whole.     atorvastatin (LIPITOR) 80 MG tablet Take 1 tablet (80 mg total) by mouth daily. 90 tablet 0   buPROPion (WELLBUTRIN XL) 300 MG 24 hr tablet Take one tablet by mouth every morning, 30 tablet 3   buPROPion (WELLBUTRIN XL) 300 MG 24 hr tablet Take 1 tablet by mouth every morning 90 tablet 1   COVID-19 At Home Antigen Test (CARESTART COVID-19 HOME TEST) KIT Use as directed within package instructions 4 each 0   COVID-19 mRNA bivalent vaccine, Pfizer, (PFIZER COVID-19 VAC BIVALENT) injection Inject into the muscle. 0.3 mL 0   COVID-19 mRNA Vac-TriS, Pfizer, (PFIZER-BIONT COVID-19 VAC-TRIS) SUSP injection Inject into the muscle. (Patient not taking: No sig reported) 0.3 mL 0   guanFACINE (TENEX) 2 MG tablet Take 1 tablet by mouth every morning and 1 tablet by mouth every night at bedtime 180 tablet 1   hydrochlorothiazide (HYDRODIURIL) 25 MG tablet TAKE 1 TABLET BY MOUTH ONCE DAILY 90 tablet 2   levothyroxine (SYNTHROID) 88 MCG tablet TAKE 1 TABLET BY MOUTH ONCE DAILY 90 tablet 3   levothyroxine (SYNTHROID) 88 MCG tablet TAKE 1 TABLET BY MOUTH ONCE A DAY 90 tablet 3   LORazepam (ATIVAN) 1 MG tablet Take 1 tablet by mouth daily as needed for anxiety (30 day supply) 30 tablet 1   methylphenidate (RITALIN) 20 MG tablet TAKE 1 TABLET BY MOUTH 3 TIMES DAILY (FILL 01/04/21) 270 tablet 0   methylphenidate (RITALIN) 20 MG tablet Take 1 tablet by mouth 3 times daily 270 tablet 0   mirabegron ER (MYRBETRIQ) 50 MG TB24 tablet Take 1 tablet (50 mg total) by mouth daily. 30 tablet 1   pilocarpine (SALAGEN) 5 MG tablet TAKE 1 TABLET BY MOUTH 4 TIMES DAILY 360 tablet 1   polyethylene glycol  (MIRALAX / GLYCOLAX) 17 g packet Take 8.5 g by mouth daily. Mix in liquid and drink     No current facility-administered medications for this visit.     Past Medical History:  Diagnosis Date   Cancer ([place])    OVARIAN   Colonic polyp    Gastritis    Hyperlipidemia    Hypertension    Sjogren's disease ([place])    Stroke [facility])    Thyroid disease    HYPOTHYROIDISM   TIA (transient ischemic attack) 04/06/2021    Past Surgical History:  Procedure Laterality Date   ABDOMINAL HYSTERECTOMY     Ovarian cancer   BIOPSY  02/04/2021   Procedure: BIOPSY;  Surgeon: Ronald Lobo, MD;  Location: [facility];  Service: Endoscopy;;   BREAST CYST ASPIRATION     CESAREAN SECTION     x2   COLONOSCOPY  10-06   Dr. Earnest Bailey   ESOPHAGOGASTRODUODENOSCOPY N/A 02/04/2021   Procedure: ESOPHAGOGASTRODUODENOSCOPY (EGD);  Surgeon: Ronald Lobo, MD;  Location: [facility];  Service: Endoscopy;  Laterality: N/A;   TONSILLECTOMY      Social History   Socioeconomic History   Marital status: Married    Spouse name: Not on file   Number of children: 2   Years of education: Not on file   Highest education level: Not on file  Occupational History  Not on file  Tobacco Use   Smoking status: Never   Smokeless tobacco: Never  Vaping Use   Vaping Use: Never used  Substance and Sexual Activity   Alcohol use: Yes    Alcohol/week: 4.0 standard drinks    Types: 4 Glasses of wine per week    Comment: 05/21/21 1 glasses wine 3 x week   Drug use: No   Sexual activity: Not Currently  Other Topics Concern   Not on file  Social History Narrative   Lives with spouse   Social Determinants of Health   Financial Resource Strain: Not on file  Food Insecurity: Not on file  Transportation Needs: Not on file  Physical Activity: Not on file  Stress: Not on file  Social Connections: Not on file  Intimate Partner Violence: Not on file    Family History  Problem Relation Age of Onset   Mental illness  Mother    Hypertension Mother    Heart failure Mother    CAD Father        CABG at age 73   Mental illness Sister    Alcoholism Sister    Mental illness Brother    Alcoholism Brother    Mental illness Maternal Uncle    Mental illness Maternal Grandmother    Cancer Paternal Grandmother     ROS: no fevers or chills, productive cough, hemoptysis, dysphasia, odynophagia, melena, hematochezia, dysuria, hematuria, rash, seizure activity, orthopnea, PND, pedal edema, claudication. Remaining systems are negative.  Physical Exam: Well-developed well-nourished in no acute distress.  Skin is warm and dry.  HEENT is normal.  Neck is supple.  Chest is clear to auscultation with normal expansion.  Cardiovascular exam is regular rate and rhythm.  Abdominal exam nontender or distended. No masses palpated. Extremities show no edema. neuro grossly intact  ECG- personally reviewed  A/P  1 hypertension-BP controlled.  Continue present medical regimen.   2 hyperlipidemia-continue statin.   3 family history of coronary artery disease-previous calcium score 0.  No chest pain.  Continue lifestyle modification.  Kirk Ruths, MD

## 2021-11-10 ENCOUNTER — Ambulatory Visit: Payer: 59 | Admitting: Cardiology

## 2021-11-18 ENCOUNTER — Encounter: Payer: Self-pay | Admitting: Family Medicine

## 2021-11-18 ENCOUNTER — Other Ambulatory Visit (HOSPITAL_COMMUNITY): Payer: Self-pay

## 2021-11-19 ENCOUNTER — Other Ambulatory Visit: Payer: Self-pay | Admitting: Obstetrics & Gynecology

## 2021-11-19 ENCOUNTER — Other Ambulatory Visit: Payer: Self-pay | Admitting: Family Medicine

## 2021-11-19 DIAGNOSIS — Z1231 Encounter for screening mammogram for malignant neoplasm of breast: Secondary | ICD-10-CM

## 2021-11-26 ENCOUNTER — Encounter: Payer: Self-pay | Admitting: Adult Health

## 2021-11-26 ENCOUNTER — Ambulatory Visit: Payer: 59 | Admitting: Adult Health

## 2021-11-26 VITALS — BP 112/68 | HR 86 | Ht 65.0 in | Wt 125.0 lb

## 2021-11-26 DIAGNOSIS — G459 Transient cerebral ischemic attack, unspecified: Secondary | ICD-10-CM

## 2021-11-26 NOTE — Progress Notes (Signed)
?[name] ?[address] ?[place] [address] ?[phone] ? ?     OFFICE FOLLOW UP NOTE ? ?Ms. Alamillo Cellar ?Date of Birth:  January 05, 1961 ?Medical Record Number:  [id number]  ? ?Reason for Referral: TIA follow-up ? ? ? ?SUBJECTIVE: ? ? ?CHIEF COMPLAINT:  ?Chief Complaint  ?Patient presents with  ? Follow-up  ?  Rm 2 alone ?Pt is well and stable, no new concerns   ? ? ? ?HPI:  ? ?Update 11/26/2021 JM: 61 year old female with history of TIAs who returns for 72-monthfollow-up.  She has been doing well since prior visit without any new or reoccurring stroke/TIA symptoms.  Compliant on aspirin and atorvastatin, denies side effects.  Blood pressure today 112/68, routinely monitors at home and typically stable. Does continue to have stressors as her husband has pancreatic cancer and had a fall over the past week causing multi level vertebra fractures. She has been trying to keep stress levels low but obviously has been quite challenging. Has f/u visit soon with PCP and cardiology f/u this summer. No new concerns at this time.  ? ? ? ? ?History provided for reference purposes only ?Update 05/21/2021 JM: MMABRY SANTARELLIis a 61y.o. who returns for recent hospital follow-up unaccompanied.  She was previously seen in office 11/25/2020 for TIA follow-up and discharged from clinic as doing well and routinely follow-up with PCP.  She presented to the ED on 04/06/2021 with right arm and leg numbness and heaviness as well as right facial tightness.  CT, MRI and MRA head/neck unremarkable.  EF 55 to 60%.  LDL 87.  A1c 5.7.  UDS positive for benzo. Per Dr. XErlinda Hong pt neuro intact, except RLE proximal muscle strength lack of effort on exam, distal RLE muscle strength comparable with left. Etiology for pt symptoms concerning for anxiety related as pt stated that her husband has pancreatic cancer which caused a lot stress on her. Of course, TIA is still in DDx given her risk factors. Recommend DAPT for 3 weeks and  then ASA alone. Increased lipitor from 40 to 80. PT/OT no recs.  ? ?Overall doing well since discharge.  Denies new or reoccurring stroke/TIA symptoms.  Completed 3 weeks DAPT -remains on aspirin 81 mg daily and atorvastatin 80 mg daily.  Blood pressure today 144/78. (Just took HCTZ prior to appt).  Routinely monitors at home and typically stable.  She has been trying to manage stress levels in regards to her husband's [facility].  No further concerns at this time. ? ? ?Update 11/25/2020 JM: Ms. KCanevarireturns for 665-monthIA follow-up unaccompanied ? ?Doing well since prior visit without new or recurring stroke/TIA symptoms ?Compliant on aspirin 81 mg daily and atorvastatin 40 mg daily -denies side effects ?Blood pressure today 117/70 ? ?Lipid panel 07/2020 LDL 76 ? ?No concerns at this time ? ?Initial visit 05/28/2020 JM: Ms. 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 ?Stroke admission 04/29/2020 ?Ms. MeKHALAYA MCGURNs a [age].o. female with history of HTN, HLD,  Sjogren's disease, hypothyroidism who presented on 04/29/2020 with sudden onset of dizziness followed by numbness left leg and shortly after the left arm as well lasting approximately 1 hour.   Stroke work-up completed with possible right brain TIA vs anxiety (significant stress due to recent diagnosis of recurrent cancer in her husband).  Due to possible TIA, recommended aspirin and Plavix for 3 weeks followed by aspirin alone.  Hx of HTN stable.  LDL 110  initiate atorvastatin 40 mg daily.  No history or evidence of DM with A1c 5.6.  Other stroke risk factors include EtOH use but no prior stroke history.  Evaluated by therapy and was discharged home in stable condition without therapy needs. ? ?R brain TIA versus anxiety ?Code Stroke CT head No acute abnormality.  ASPECTS 10.    ?CTA head R V4 diminuative. L PCA high-grade P2/P3 jxn. B PCA atherosclerosis. Mild atherosclerosis B M2 ?CTA neck Unremarkable, streak artifact from recent OR cervical  ?MRI  / MRA  Unremarkable  ?2D Echo EF 60-65%. No source of embolus  ?LDL 110 ?HgbA1c 5.6 ?VTE prophylaxis - Lovenox 40 mg sq daily  ?No antithrombotic prior to admission, now on aspirin 325 mg daily. Decrease aspirin to 81 and add plavix 75 x 3 weeks then aspirin alone.  ?Therapy recommendations:  No therapy needs ?Disposition:  Return home ? ? ? ? ? ?ROS:   ?14 system review of systems performed and negative with exception of no complaints ? ?PMH:  ?Past Medical History:  ?Diagnosis Date  ? Cancer [facility])   ? OVARIAN  ? Colonic polyp   ? Gastritis   ? Hyperlipidemia   ? Hypertension   ? Sjogren's disease ([place])   ? Stroke [facility])   ? Thyroid disease   ? HYPOTHYROIDISM  ? TIA (transient ischemic attack) 04/06/2021  ? ? ?PSH:  ?Past Surgical History:  ?Procedure Laterality Date  ? ABDOMINAL HYSTERECTOMY    ? Ovarian cancer  ? BIOPSY  02/04/2021  ? Procedure: BIOPSY;  Surgeon: Ronald Lobo, MD;  Location: [facility];  Service: Endoscopy;;  ? BREAST CYST ASPIRATION    ? CESAREAN SECTION    ? x2  ? COLONOSCOPY  10-06  ? Dr. Earnest Bailey  ? ESOPHAGOGASTRODUODENOSCOPY N/A 02/04/2021  ? Procedure: ESOPHAGOGASTRODUODENOSCOPY (EGD);  Surgeon: Ronald Lobo, MD;  Location: [facility];  Service: Endoscopy;  Laterality: N/A;  ? TONSILLECTOMY    ? ? ?Social History:  ?Social History  ? ?Socioeconomic History  ? Marital status: Married  ?  Spouse name: Not on file  ? Number of children: 2  ? Years of education: Not on file  ? Highest  education level: Not on file  ?Occupational History  ? Not on file  ?Tobacco Use  ? Smoking status: Never  ? Smokeless tobacco: Never  ?Vaping Use  ? Vaping Use: Never used  ?Substance and Sexual Activity  ? Alcohol use: Yes  ?  Alcohol/week: 4.0 standard drinks  ?  Types: 4 Glasses of wine per week  ?  Comment: 05/21/21 1 glasses wine 3 x week  ? Drug use: No  ? Sexual activity: Not Currently  ?Other Topics Concern  ? Not on file  ?Social History Narrative  ? Lives with spouse  ? ?Social Determinants of Health  ? ?Financial Resource Strain: Not on file  ?Food Insecurity: Not on file  ?Transportation Needs: Not on file  ?Physical Activity: Not on file  ?Stress: Not on file  ?Social Connections: Not on file  ?Intimate  Partner Violence: Not on file  ? ? ?Family History:  ?Family History  ?Problem Relation Age of Onset  ? Mental illness Mother   ? Hypertension Mother   ? Heart failure Mother   ? CAD Father   ?     CABG at age 36  ? Mental illness Sister   ? Alcoholism Sister   ? Mental illness Brother   ? Alcoholism Brother   ? Mental illness Maternal Uncle   ? Mental illness Maternal Grandmother   ? Cancer Paternal Grandmother   ? ? ?Medications:   ?Current Outpatient Medications on File Prior to Visit  ?Medication Sig Dispense Refill  ? aspirin EC 81 MG tablet Take 81 mg by mouth every morning. Swallow whole.    ? atorvastatin (LIPITOR) 80 MG tablet Take 1 tablet (80 mg total) by mouth daily. 90 tablet 0  ? buPROPion (WELLBUTRIN XL) 300 MG 24 hr tablet Take 1 tablet by mouth every morning 90 tablet 1  ? guanFACINE (TENEX) 2 MG tablet Take 1 tablet by mouth every morning and 1 tablet by mouth every night at bedtime 180 tablet 1  ? hydrochlorothiazide (HYDRODIURIL) 25 MG tablet TAKE 1 TABLET BY MOUTH ONCE DAILY 90 tablet 2  ? levothyroxine (SYNTHROID) 88 MCG tablet TAKE 1 TABLET BY MOUTH ONCE DAILY 90 tablet 3  ? LORazepam (ATIVAN) 1 MG tablet Take 1 tablet by mouth daily as needed for anxiety (30 day supply) 30 tablet 1   ? methylphenidate (RITALIN) 20 MG tablet Take 1 tablet by mouth 3 times daily 270 tablet 0  ? mirabegron ER (MYRBETRIQ) 50 MG TB24 tablet Take 1 tablet (50 mg total) by mouth daily. 30 tablet 1  ? pilocarpine (

## 2021-12-02 ENCOUNTER — Other Ambulatory Visit (HOSPITAL_COMMUNITY): Payer: Self-pay

## 2021-12-03 ENCOUNTER — Other Ambulatory Visit (HOSPITAL_COMMUNITY): Payer: Self-pay

## 2021-12-03 DIAGNOSIS — F902 Attention-deficit hyperactivity disorder, combined type: Secondary | ICD-10-CM | POA: Diagnosis not present

## 2021-12-03 DIAGNOSIS — F419 Anxiety disorder, unspecified: Secondary | ICD-10-CM | POA: Diagnosis not present

## 2021-12-03 MED ORDER — LORAZEPAM 1 MG PO TABS
ORAL_TABLET | ORAL | 1 refills | Status: DC
  Filled 2021-12-31: qty 30, 30d supply, fill #0

## 2021-12-03 MED ORDER — BUPROPION HCL ER (XL) 300 MG PO TB24
300.0000 mg | ORAL_TABLET | Freq: Every morning | ORAL | 1 refills | Status: DC
  Filled 2021-12-03 – 2022-02-22 (×2): qty 90, 90d supply, fill #0

## 2021-12-03 MED ORDER — GUANFACINE HCL 2 MG PO TABS
ORAL_TABLET | ORAL | 1 refills | Status: DC
  Filled 2021-12-03 – 2022-04-03 (×2): qty 180, 90d supply, fill #0

## 2021-12-03 MED ORDER — METHYLPHENIDATE HCL 20 MG PO TABS
20.0000 mg | ORAL_TABLET | Freq: Three times a day (TID) | ORAL | 0 refills | Status: DC
  Filled 2021-12-03: qty 270, 90d supply, fill #0

## 2021-12-03 MED ORDER — SERTRALINE HCL 50 MG PO TABS
ORAL_TABLET | ORAL | 1 refills | Status: DC
  Filled 2021-12-03: qty 30, 30d supply, fill #0
  Filled 2021-12-31: qty 30, 30d supply, fill #1

## 2021-12-06 ENCOUNTER — Encounter: Payer: Self-pay | Admitting: Family Medicine

## 2021-12-06 ENCOUNTER — Other Ambulatory Visit (HOSPITAL_COMMUNITY): Payer: Self-pay

## 2021-12-06 DIAGNOSIS — M79645 Pain in left finger(s): Secondary | ICD-10-CM

## 2021-12-06 DIAGNOSIS — F329 Major depressive disorder, single episode, unspecified: Secondary | ICD-10-CM | POA: Insufficient documentation

## 2021-12-06 DIAGNOSIS — E785 Hyperlipidemia, unspecified: Secondary | ICD-10-CM | POA: Diagnosis present

## 2021-12-07 ENCOUNTER — Other Ambulatory Visit (HOSPITAL_COMMUNITY): Payer: Self-pay

## 2021-12-08 ENCOUNTER — Other Ambulatory Visit (HOSPITAL_COMMUNITY): Payer: Self-pay

## 2021-12-08 ENCOUNTER — Telehealth: Payer: 59

## 2021-12-08 MED ORDER — MYRBETRIQ 50 MG PO TB24
ORAL_TABLET | ORAL | 6 refills | Status: DC
  Filled 2021-12-08: qty 30, 30d supply, fill #0

## 2021-12-11 ENCOUNTER — Ambulatory Visit
Admission: RE | Admit: 2021-12-11 | Discharge: 2021-12-11 | Disposition: A | Discharge status: Home / Self Care | Payer: 59 | Source: Ambulatory Visit | Attending: Family Medicine | Admitting: Family Medicine

## 2021-12-11 ENCOUNTER — Ambulatory Visit
Admission: RE | Admit: 2021-12-11 | Discharge: 2021-12-11 | Disposition: A | Discharge status: Home / Self Care | Payer: 59 | Source: Ambulatory Visit | Attending: Obstetrics & Gynecology | Admitting: Obstetrics & Gynecology

## 2021-12-11 DIAGNOSIS — Z1231 Encounter for screening mammogram for malignant neoplasm of breast: Secondary | ICD-10-CM

## 2021-12-11 DIAGNOSIS — M79645 Pain in left finger(s): Secondary | ICD-10-CM

## 2021-12-11 IMAGING — MG MM DIGITAL SCREENING BILAT W/ TOMO AND CAD
6 of 10 series · 6 of 30 positions shown · non-contrast
Comparison: Previous exam(s).

CLINICAL DATA: Screening.

EXAM:
DIGITAL SCREENING BILATERAL MAMMOGRAM WITH TOMOSYNTHESIS AND CAD
TECHNIQUE: Bilateral screening digital craniocaudal and mediolateral oblique
mammograms were obtained. Bilateral screening digital breast
tomosynthesis was performed. The images were evaluated with
computer-aided detection.

[R MLO synth-2D]
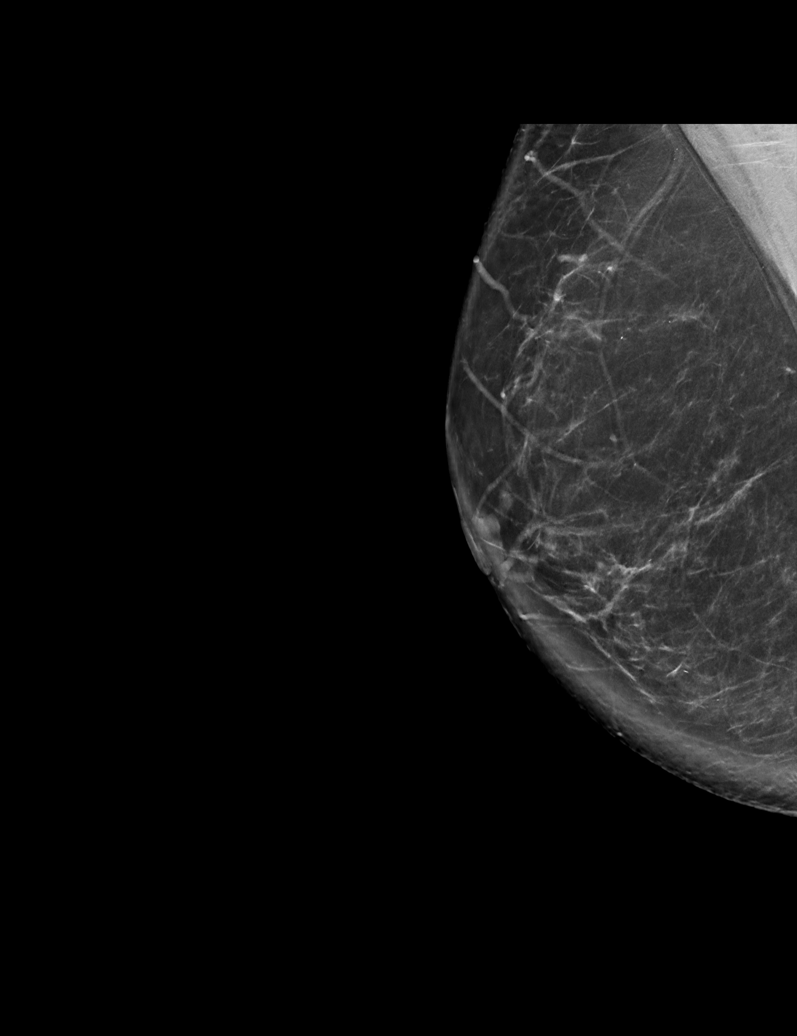

[L MLO synth-2D]
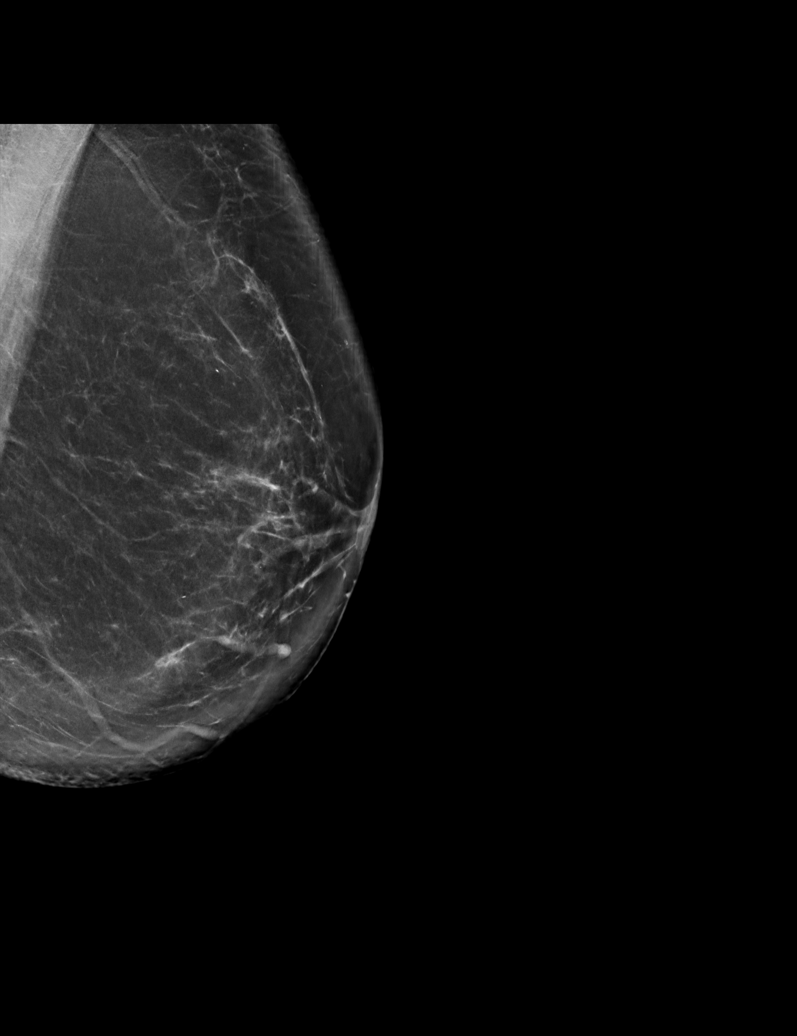

[R CC synth-2D]
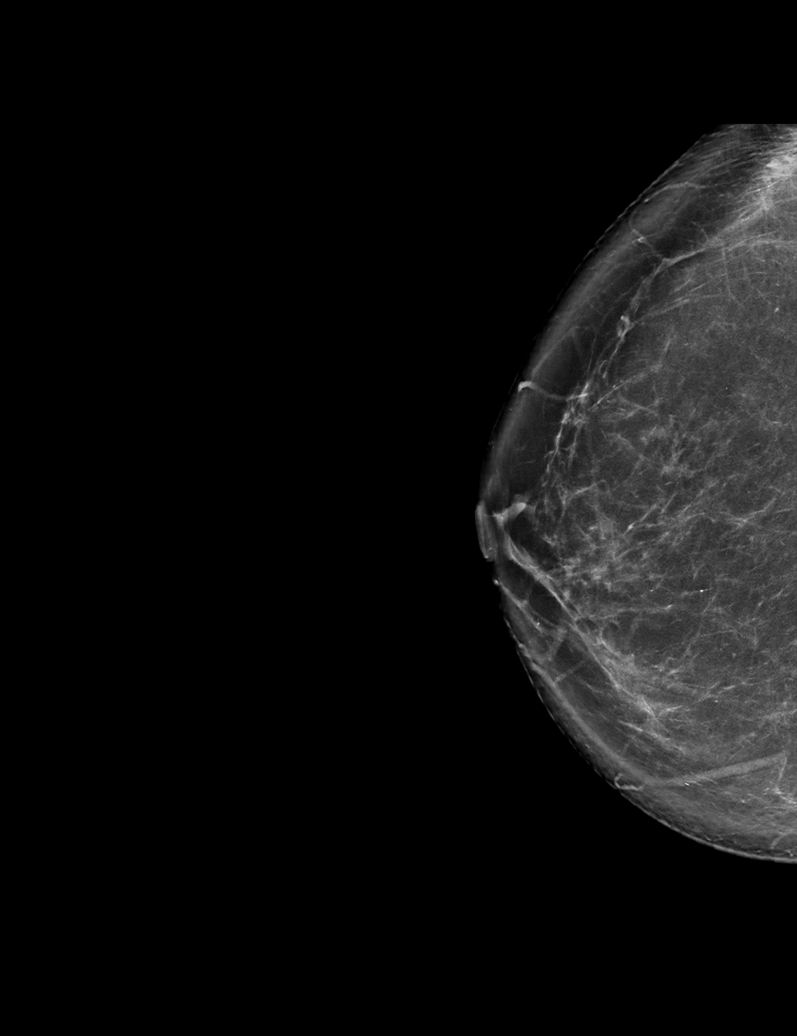

[L CC synth-2D (1 of 2)]
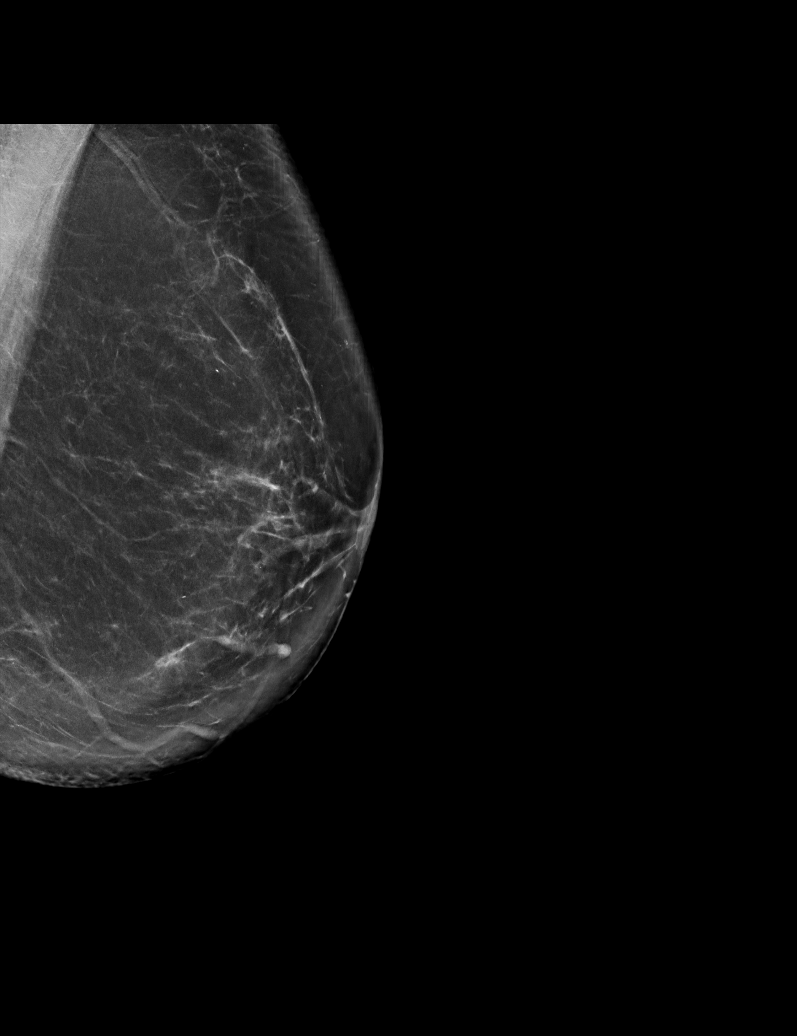

[L CC synth-2D (2 of 2)]
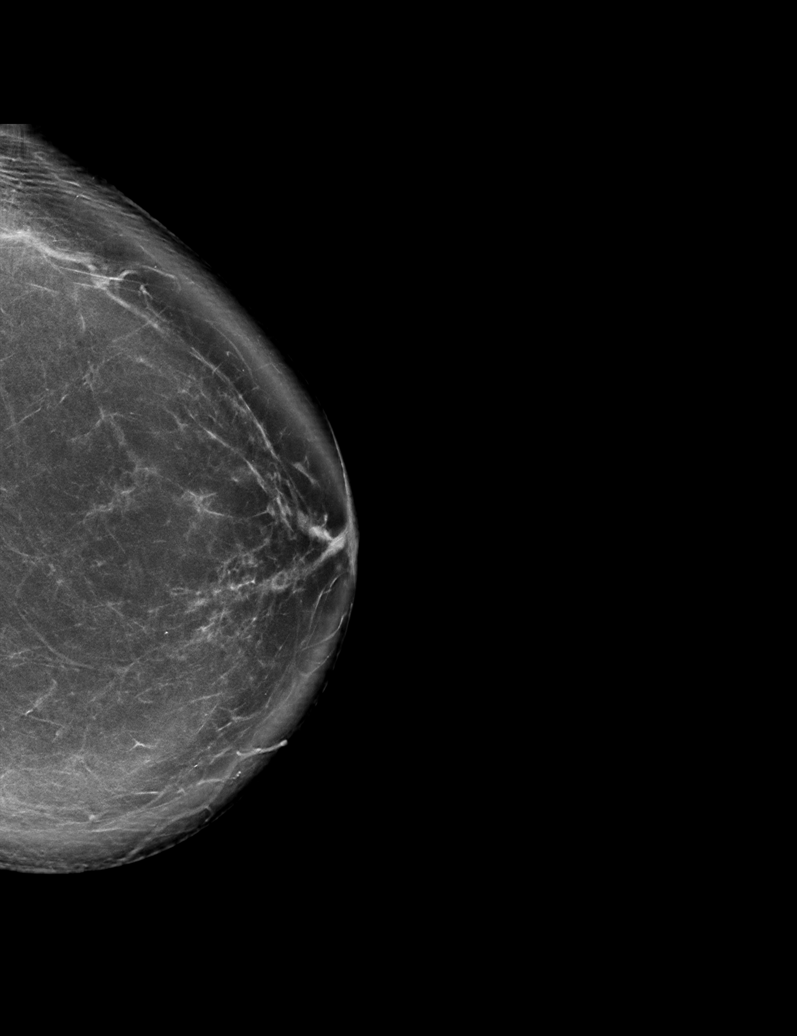

[R CC tomo · tomo slice 43/84.0]
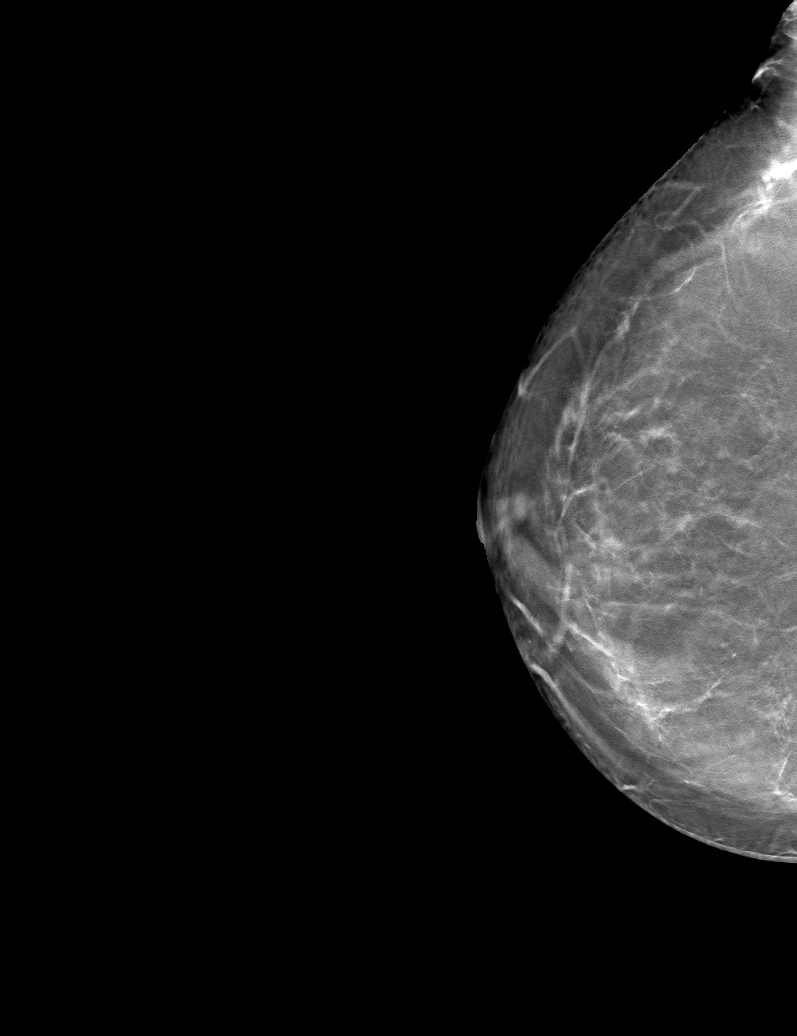

[6 of 30 positions shown; findings below may reference images not displayed]

ACR Breast Density Category b: There are scattered areas of
fibroglandular density.
FINDINGS: There are no findings suspicious for malignancy.
IMPRESSION: No mammographic evidence of malignancy. A result letter of this
screening mammogram will be mailed directly to the patient.

RECOMMENDATION:
Screening mammogram in one year. (Code:[BY])

BI-RADS CATEGORY  1: Negative.

## 2021-12-11 IMAGING — CR DG HAND COMPLETE 3+V*L*
3 series · 3 of 3 positions shown · non-contrast
Comparison: None.

CLINICAL DATA: 61-year-old female with left ring finger pain for
approximately a month. No known injury.

EXAM:
LEFT HAND - COMPLETE 3+ VIEW

[x hand pa left]
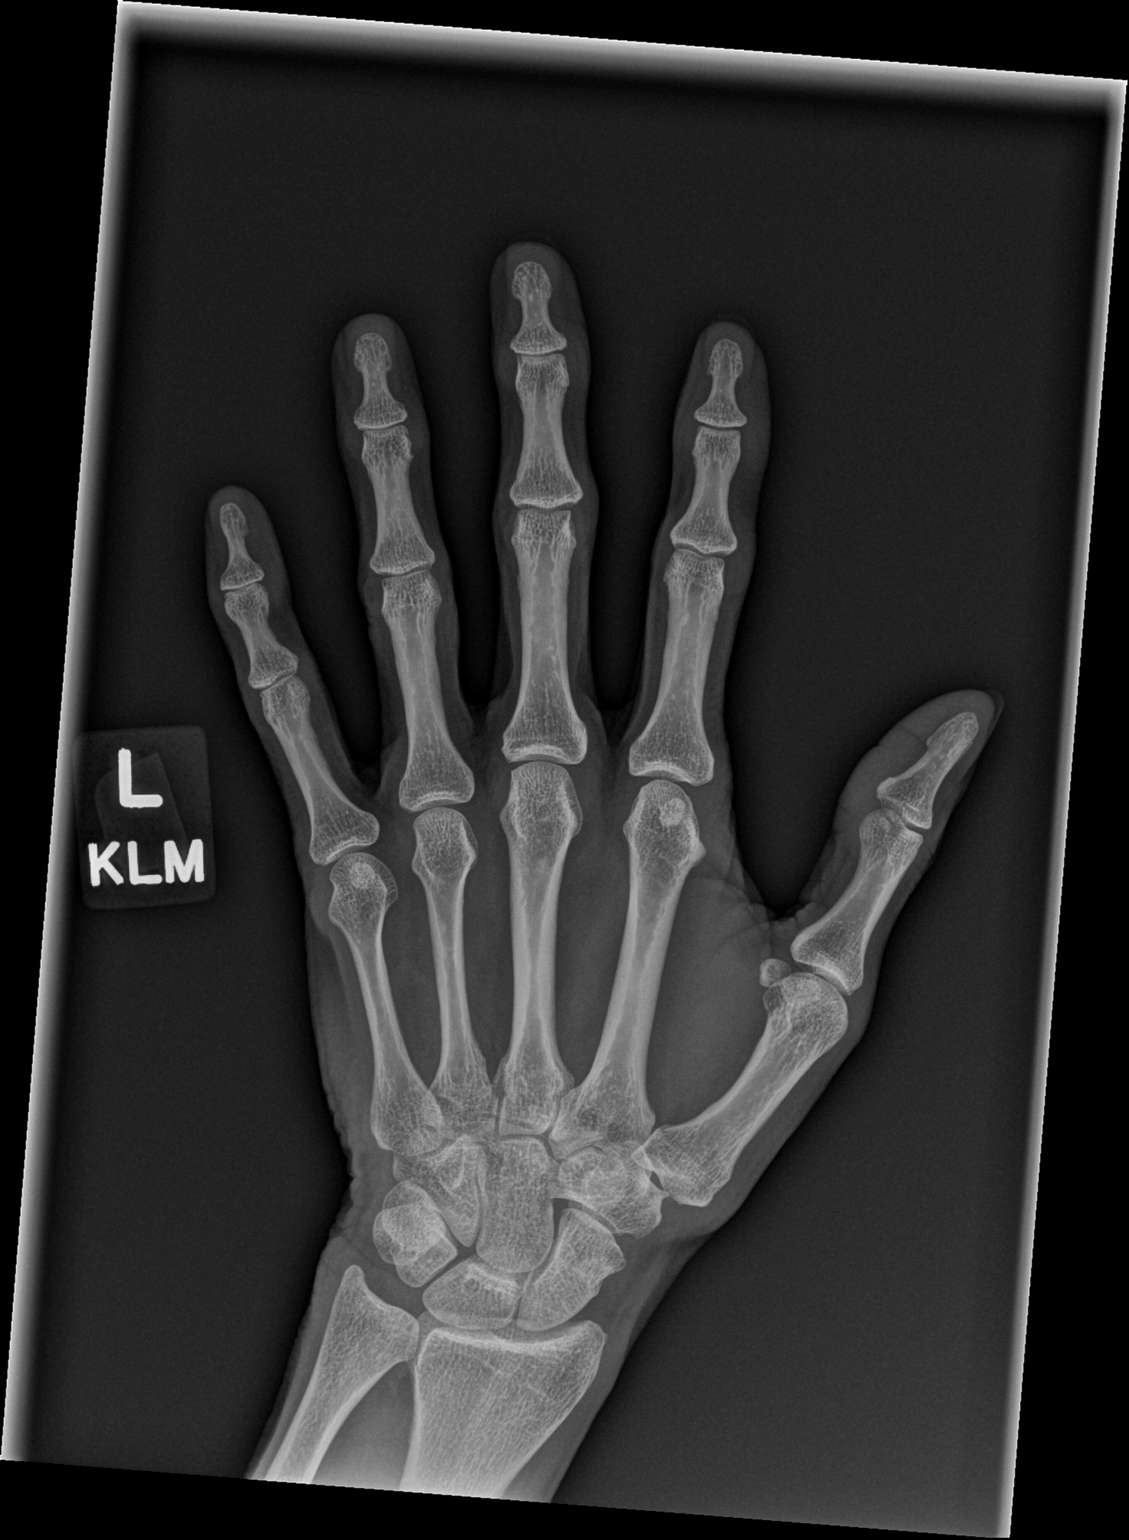

[x hand obl left]
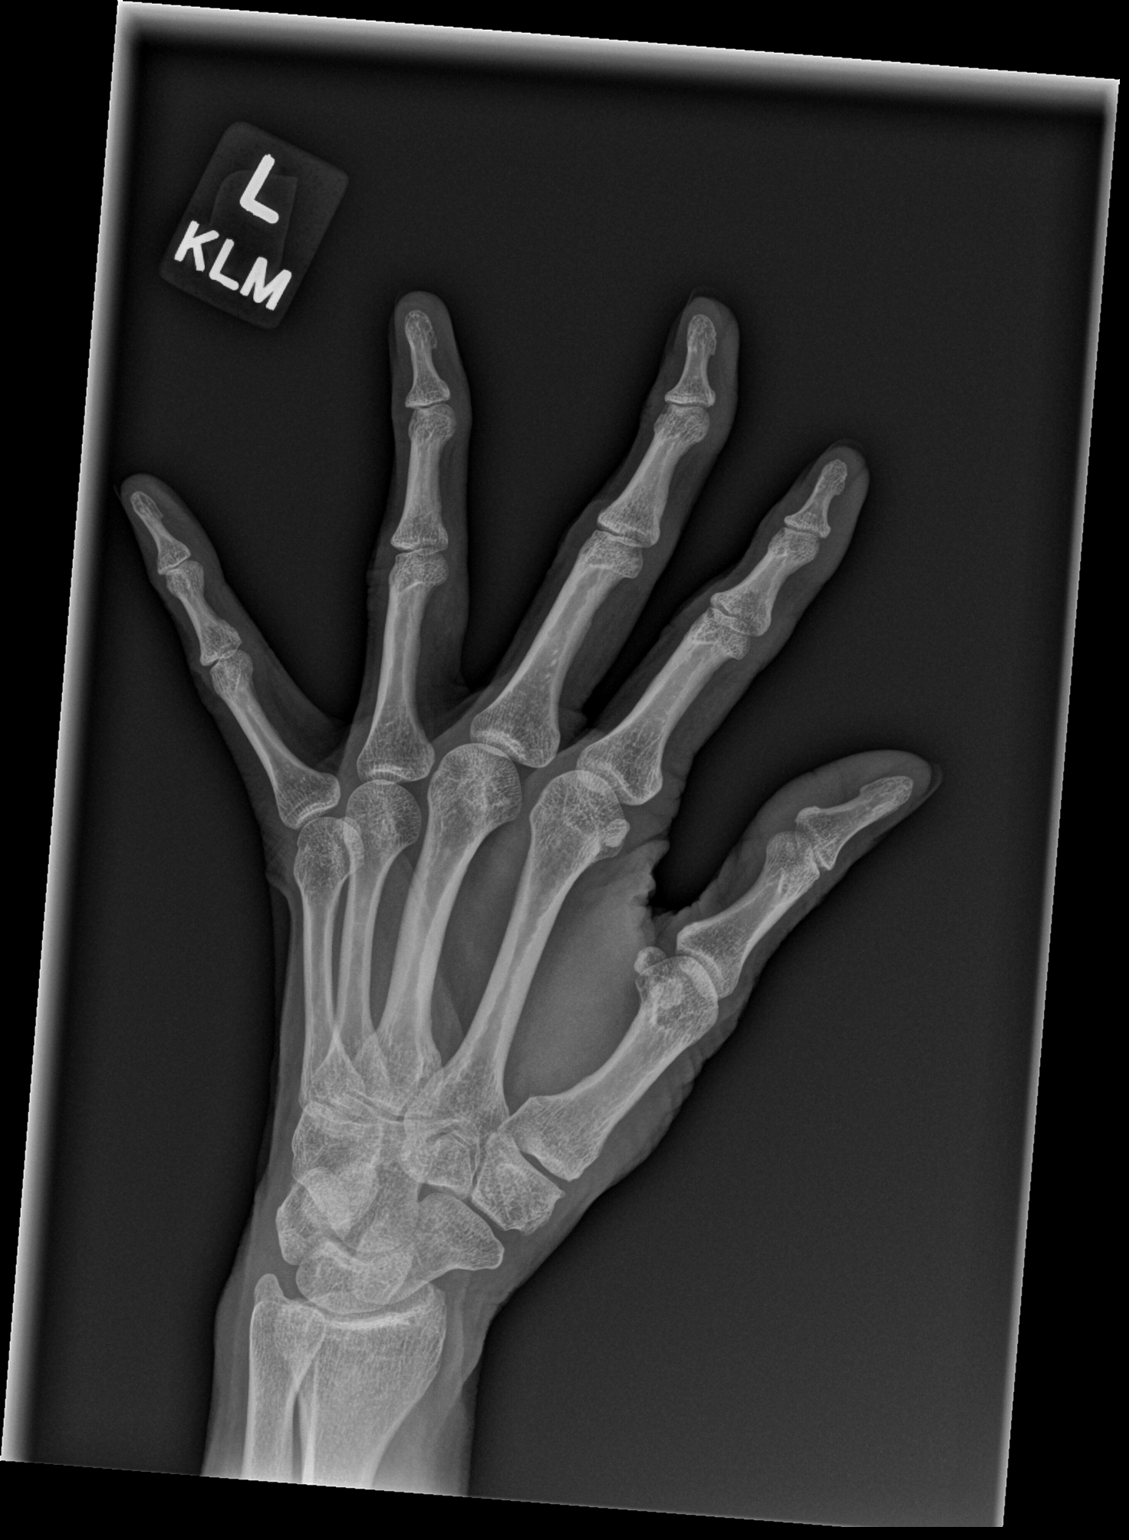

[x hand lat left]
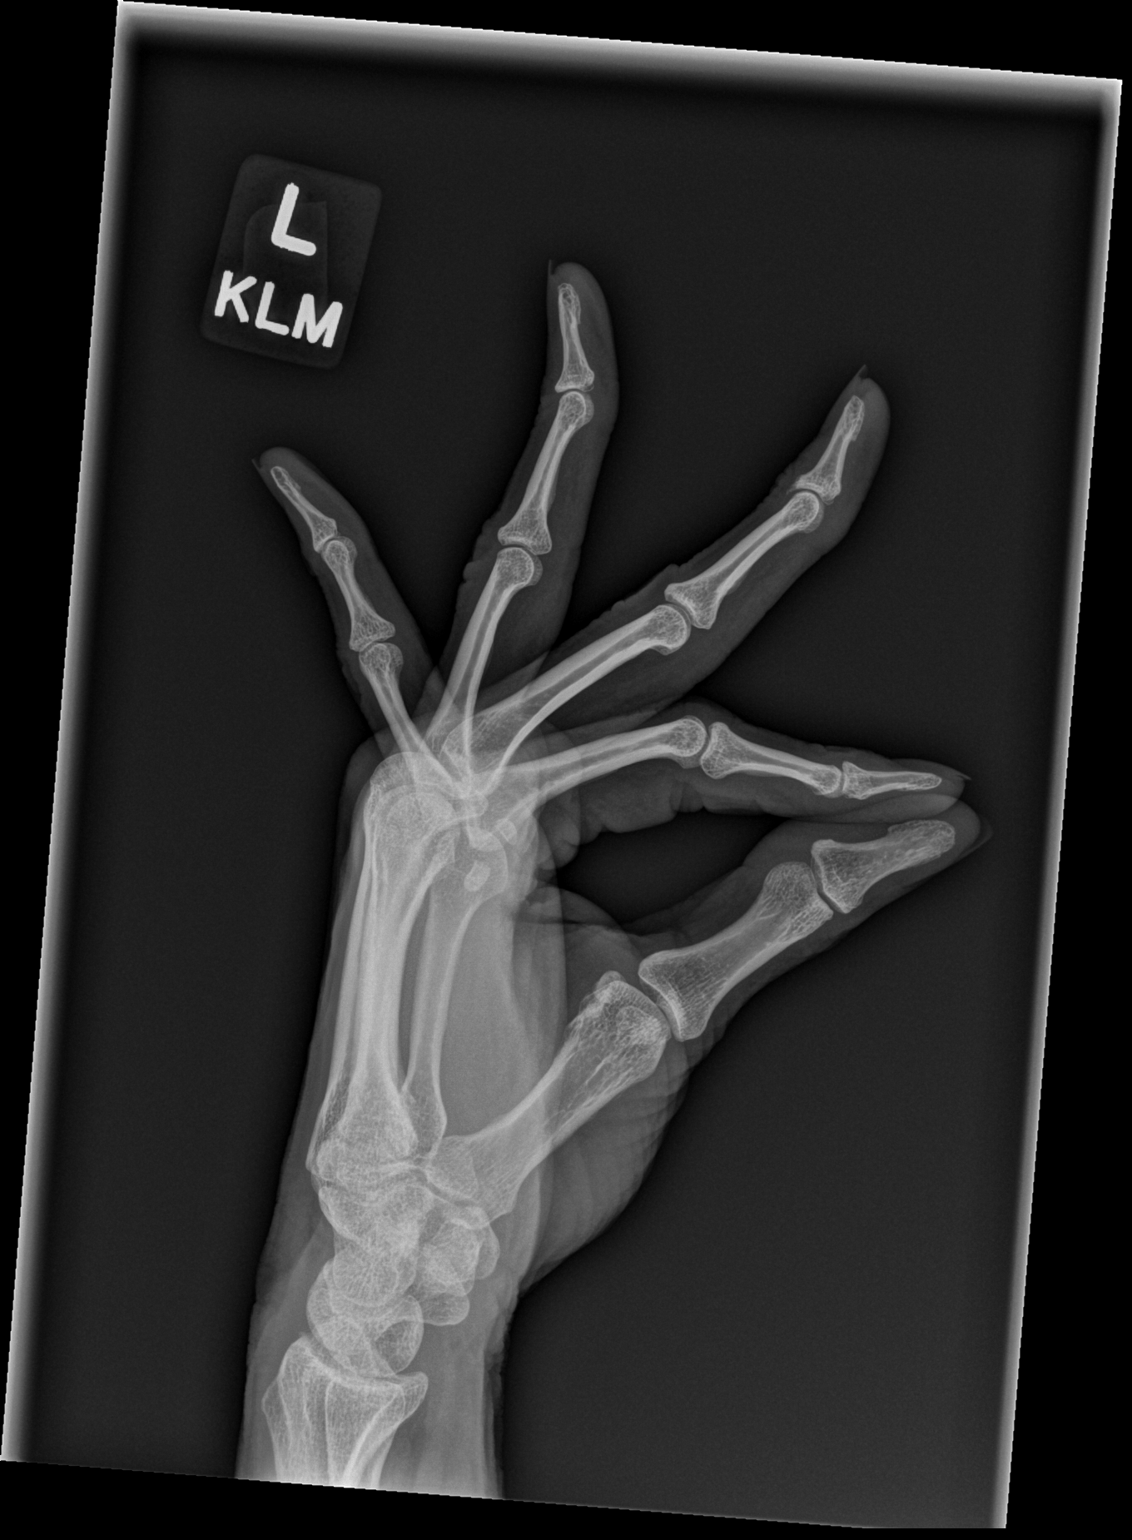

[3 of 3 positions shown; findings below may reference images not displayed]

FINDINGS: Bone mineralization is within normal limits for age. Distal radius
and ulna appear intact. Carpal bones appear intact and aligned.
Metacarpals appear intact. MCP joint spaces are normal for age. IP
joint spaces also appear normal for age. Phalanges appear intact. No
acute osseous abnormality identified. No discrete soft tissue
abnormality.
IMPRESSION: Negative.

## 2021-12-19 ENCOUNTER — Telehealth: Payer: 59 | Admitting: Emergency Medicine

## 2021-12-19 DIAGNOSIS — R42 Dizziness and giddiness: Secondary | ICD-10-CM

## 2021-12-19 NOTE — Progress Notes (Signed)
Based on what you shared with me, I feel your condition warrants further evaluation and I recommend that you be seen in a face to face visit. Motion sickness is associated with car or boat travel but you don't describe any plans for traveling. This lack of travel by a vehicle means your dizziness is not something I can properly figure out through an evisit. I think you need an in person visit to have someone do an exam to figure out what might be causing your symptoms.  ? ?  ?NOTE: There will be NO CHARGE for this eVisit ?  ?If you are having a true medical emergency please call 911.   ?  ? For an urgent face to face visit, [facility] has six urgent care centers for your convenience:  ?  ? [facility] [place] [facility] ?Get Driving Directions ?[phone] ?[address] ?[place], [place] [address] ?  ? [facility] [place] [facility]) ?Get Driving Directions ?[phone] ?[address] ?[place], [place] [address] ? ?[facility] [place] [facility]) ?Get Driving Directions ?[address] [place],  [place]  [address] ? ?[facility] ?Get Driving Directions ?[phone] ?[address], [address] ?[place], [place] [address] ?  ?[facility] [place] ?Get Driving Directions  ?[phone] ?[address] ?[address] ?[place], [place] [address] ?  ?[facility] ?Get Driving Directions ?[phone] ?[address] ?[place], [place] [address] ? ?Your MyChart E-visit questionnaire answers were reviewed by a board certified advanced clinical practitioner to complete your personal care plan based on your specific symptoms.  Thank you for using e-Visits. ?  ? ?

## 2021-12-23 ENCOUNTER — Other Ambulatory Visit (HOSPITAL_COMMUNITY): Payer: Self-pay

## 2021-12-23 MED ORDER — LEVOTHYROXINE SODIUM 88 MCG PO TABS
88.0000 ug | ORAL_TABLET | Freq: Every day | ORAL | 3 refills | Status: DC
  Filled 2021-12-23: qty 90, 90d supply, fill #0
  Filled 2022-03-26: qty 90, 90d supply, fill #1
  Filled 2022-07-16: qty 90, 90d supply, fill #2
  Filled 2022-11-10: qty 90, 90d supply, fill #3

## 2021-12-24 ENCOUNTER — Encounter: Payer: Self-pay | Admitting: Family Medicine

## 2021-12-25 ENCOUNTER — Ambulatory Visit: Payer: 59 | Admitting: Family Medicine

## 2021-12-25 ENCOUNTER — Encounter: Payer: Self-pay | Admitting: Adult Health

## 2021-12-25 ENCOUNTER — Other Ambulatory Visit (HOSPITAL_COMMUNITY): Payer: Self-pay

## 2021-12-25 DIAGNOSIS — G4700 Insomnia, unspecified: Secondary | ICD-10-CM | POA: Diagnosis not present

## 2021-12-25 DIAGNOSIS — F902 Attention-deficit hyperactivity disorder, combined type: Secondary | ICD-10-CM | POA: Diagnosis not present

## 2021-12-25 DIAGNOSIS — F419 Anxiety disorder, unspecified: Secondary | ICD-10-CM | POA: Diagnosis not present

## 2021-12-25 MED ORDER — LORAZEPAM 1 MG PO TABS
ORAL_TABLET | ORAL | 1 refills | Status: DC
  Filled 2022-02-04: qty 30, 30d supply, fill #0
  Filled 2022-03-06: qty 30, 30d supply, fill #1

## 2021-12-25 MED ORDER — SERTRALINE HCL 50 MG PO TABS: ORAL_TABLET | ORAL | 0 refills | Status: DC

## 2022-01-01 ENCOUNTER — Other Ambulatory Visit (HOSPITAL_COMMUNITY): Payer: Self-pay

## 2022-01-02 ENCOUNTER — Other Ambulatory Visit (HOSPITAL_COMMUNITY): Payer: Self-pay

## 2022-01-05 ENCOUNTER — Other Ambulatory Visit: Payer: Self-pay

## 2022-01-05 ENCOUNTER — Emergency Department (HOSPITAL_COMMUNITY): Payer: 59

## 2022-01-05 ENCOUNTER — Encounter (HOSPITAL_COMMUNITY): Payer: Self-pay

## 2022-01-05 ENCOUNTER — Inpatient Hospital Stay (HOSPITAL_COMMUNITY)
Admission: EM | Admit: 2022-01-05 | Discharge: 2022-01-07 | DRG: 690 | Disposition: A | Discharge status: Home / Self Care | Payer: 59 | Attending: Family Medicine | Admitting: Family Medicine

## 2022-01-05 DIAGNOSIS — K5901 Slow transit constipation: Secondary | ICD-10-CM | POA: Insufficient documentation

## 2022-01-05 DIAGNOSIS — I10 Essential (primary) hypertension: Secondary | ICD-10-CM | POA: Diagnosis not present

## 2022-01-05 DIAGNOSIS — Z86010 Personal history of colonic polyps: Secondary | ICD-10-CM | POA: Diagnosis not present

## 2022-01-05 DIAGNOSIS — Z8673 Personal history of transient ischemic attack (TIA), and cerebral infarction without residual deficits: Secondary | ICD-10-CM | POA: Diagnosis not present

## 2022-01-05 DIAGNOSIS — R112 Nausea with vomiting, unspecified: Secondary | ICD-10-CM | POA: Diagnosis present

## 2022-01-05 DIAGNOSIS — R7302 Impaired glucose tolerance (oral): Secondary | ICD-10-CM | POA: Diagnosis not present

## 2022-01-05 DIAGNOSIS — E785 Hyperlipidemia, unspecified: Secondary | ICD-10-CM | POA: Diagnosis present

## 2022-01-05 DIAGNOSIS — Z8249 Family history of ischemic heart disease and other diseases of the circulatory system: Secondary | ICD-10-CM | POA: Diagnosis not present

## 2022-01-05 DIAGNOSIS — N3000 Acute cystitis without hematuria: Principal | ICD-10-CM | POA: Diagnosis present

## 2022-01-05 DIAGNOSIS — F988 Other specified behavioral and emotional disorders with onset usually occurring in childhood and adolescence: Secondary | ICD-10-CM | POA: Diagnosis present

## 2022-01-05 DIAGNOSIS — K219 Gastro-esophageal reflux disease without esophagitis: Secondary | ICD-10-CM | POA: Diagnosis present

## 2022-01-05 DIAGNOSIS — R410 Disorientation, unspecified: Secondary | ICD-10-CM | POA: Diagnosis not present

## 2022-01-05 DIAGNOSIS — R9431 Abnormal electrocardiogram [ECG] [EKG]: Secondary | ICD-10-CM | POA: Diagnosis present

## 2022-01-05 DIAGNOSIS — Z7982 Long term (current) use of aspirin: Secondary | ICD-10-CM | POA: Diagnosis not present

## 2022-01-05 DIAGNOSIS — Z8543 Personal history of malignant neoplasm of ovary: Secondary | ICD-10-CM | POA: Diagnosis not present

## 2022-01-05 DIAGNOSIS — M5136 Other intervertebral disc degeneration, lumbar region: Secondary | ICD-10-CM | POA: Insufficient documentation

## 2022-01-05 DIAGNOSIS — K209 Esophagitis, unspecified without bleeding: Secondary | ICD-10-CM | POA: Diagnosis present

## 2022-01-05 DIAGNOSIS — R1110 Vomiting, unspecified: Secondary | ICD-10-CM | POA: Diagnosis not present

## 2022-01-05 DIAGNOSIS — M5412 Radiculopathy, cervical region: Secondary | ICD-10-CM | POA: Insufficient documentation

## 2022-01-05 DIAGNOSIS — R8281 Pyuria: Secondary | ICD-10-CM

## 2022-01-05 DIAGNOSIS — Z79899 Other long term (current) drug therapy: Secondary | ICD-10-CM | POA: Diagnosis not present

## 2022-01-05 DIAGNOSIS — K8020 Calculus of gallbladder without cholecystitis without obstruction: Secondary | ICD-10-CM | POA: Diagnosis present

## 2022-01-05 DIAGNOSIS — Z79890 Hormone replacement therapy (postmenopausal): Secondary | ICD-10-CM | POA: Diagnosis not present

## 2022-01-05 DIAGNOSIS — M3500 Sicca syndrome, unspecified: Secondary | ICD-10-CM | POA: Diagnosis present

## 2022-01-05 DIAGNOSIS — I700 Atherosclerosis of aorta: Secondary | ICD-10-CM | POA: Diagnosis not present

## 2022-01-05 DIAGNOSIS — E039 Hypothyroidism, unspecified: Secondary | ICD-10-CM | POA: Diagnosis not present

## 2022-01-05 DIAGNOSIS — M51369 Other intervertebral disc degeneration, lumbar region without mention of lumbar back pain or lower extremity pain: Secondary | ICD-10-CM | POA: Insufficient documentation

## 2022-01-05 DIAGNOSIS — R1312 Dysphagia, oropharyngeal phase: Secondary | ICD-10-CM | POA: Insufficient documentation

## 2022-01-05 DIAGNOSIS — M47812 Spondylosis without myelopathy or radiculopathy, cervical region: Secondary | ICD-10-CM | POA: Insufficient documentation

## 2022-01-05 LAB — RAPID URINE DRUG SCREEN, HOSP PERFORMED
Amphetamines: NOT DETECTED
Barbiturates: NOT DETECTED
Benzodiazepines: NOT DETECTED
Cocaine: NOT DETECTED
Opiates: NOT DETECTED
Tetrahydrocannabinol: NOT DETECTED

## 2022-01-05 LAB — COMPREHENSIVE METABOLIC PANEL
ALT: 35 U/L (ref 0–44)
AST: 39 U/L (ref 15–41)
Albumin: 4.6 g/dL (ref 3.5–5.0)
Alkaline Phosphatase: 86 U/L (ref 38–126)
Anion gap: 15 (ref 5–15)
BUN: 14 mg/dL (ref 8–23)
CO2: 22 mmol/L (ref 22–32)
Calcium: 10.2 mg/dL (ref 8.9–10.3)
Chloride: 99 mmol/L (ref 98–111)
Creatinine, Ser: 0.95 mg/dL (ref 0.44–1.00)
GFR, Estimated: >60 mL/min (ref >60)
Glucose, Bld: 183 mg/dL — ABNORMAL HIGH (ref 70–99)
Potassium: 3.5 mmol/L (ref 3.5–5.1)
Sodium: 136 mmol/L (ref 135–145)
Total Bilirubin: 0.8 mg/dL (ref 0.3–1.2)
Total Protein: 7.3 g/dL (ref 6.5–8.1)

## 2022-01-05 LAB — URINALYSIS, ROUTINE W REFLEX MICROSCOPIC
Bilirubin Urine: NEGATIVE
Glucose, UA: NEGATIVE mg/dL
Hgb urine dipstick: NEGATIVE
Ketones, ur: 20 mg/dL — AB
Nitrite: NEGATIVE
Protein, ur: NEGATIVE mg/dL
Specific Gravity, Urine: 1.011 (ref 1.005–1.030)
Trans Epithel, UA: 3
pH: 9 — ABNORMAL HIGH (ref 5.0–8.0)

## 2022-01-05 LAB — ETHANOL: Alcohol, Ethyl (B): <10 mg/dL (ref <10)

## 2022-01-05 LAB — CBC
HCT: 39.2 % (ref 36.0–46.0)
Hemoglobin: 13.6 g/dL (ref 12.0–15.0)
MCH: 30.3 pg (ref 26.0–34.0)
MCHC: 34.7 g/dL (ref 30.0–36.0)
MCV: 87.3 fL (ref 80.0–100.0)
Platelets: 325 10*3/uL (ref 150–400)
RBC: 4.49 MIL/uL (ref 3.87–5.11)
RDW: 12.4 % (ref 11.5–15.5)
WBC: 15.1 10*3/uL — ABNORMAL HIGH (ref 4.0–10.5)
nRBC: 0 % (ref 0.0–0.2)

## 2022-01-05 LAB — LIPASE, BLOOD: Lipase: 32 U/L (ref 11–51)

## 2022-01-05 LAB — MAGNESIUM: Magnesium: 1.5 mg/dL — ABNORMAL LOW (ref 1.7–2.4)

## 2022-01-05 IMAGING — CT CT HEAD W/O CM
3 series · 16 of 47 positions shown, 19 images · non-contrast
Comparison: [DATE]

CLINICAL DATA: Delirium.



[Series 2: head wo · axial · 0.41mm/px · z∈[-106,+29]mm · 10 of 53 slices shown, 13 images]
[im 4/53  brain]
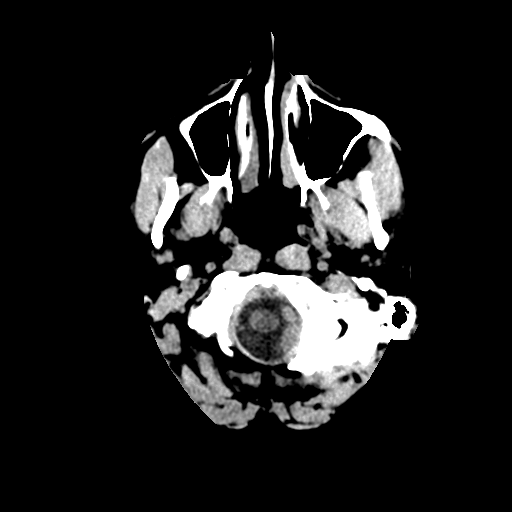
[im 4/53  bone]
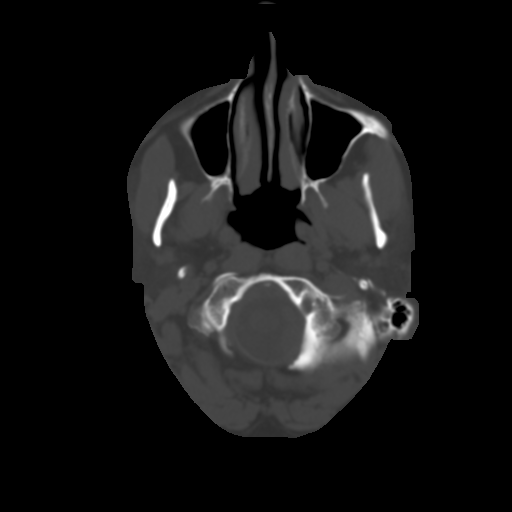
[im 9/53  brain]
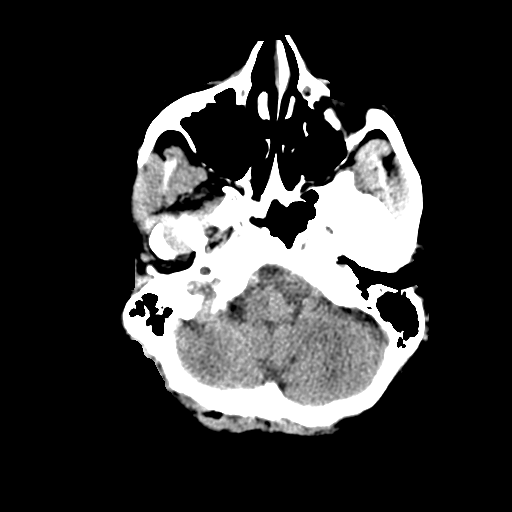
[im 15/53  brain]
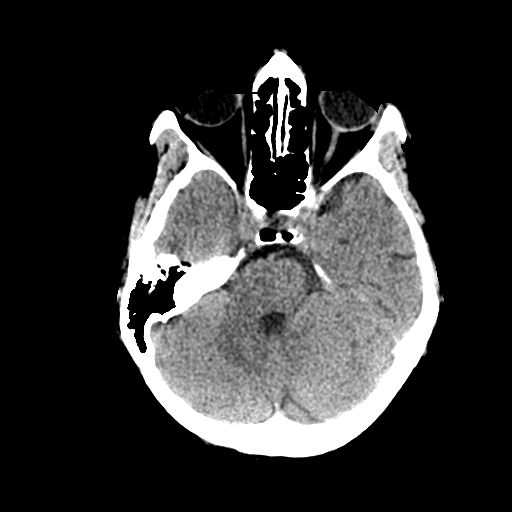
[im 18/53  brain]
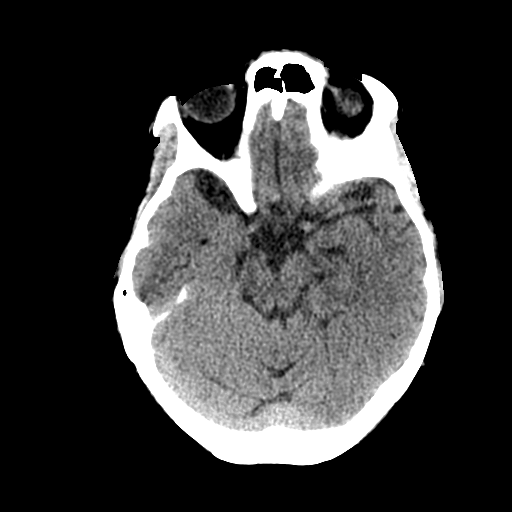
[im 24/53  brain]
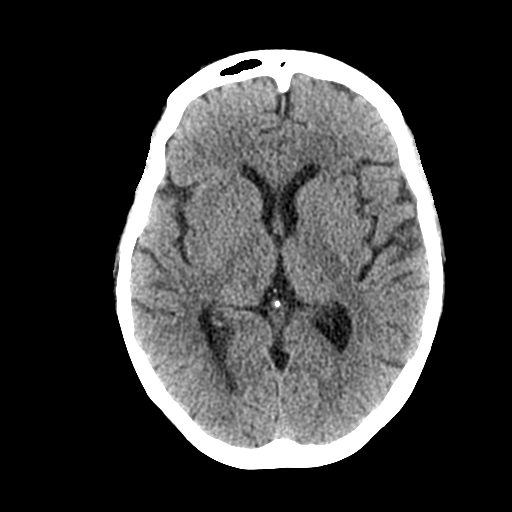
[im 24/53  bone]
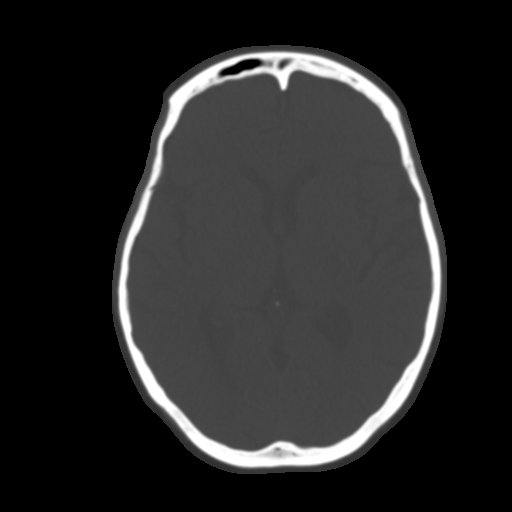
[im 29/53  brain]
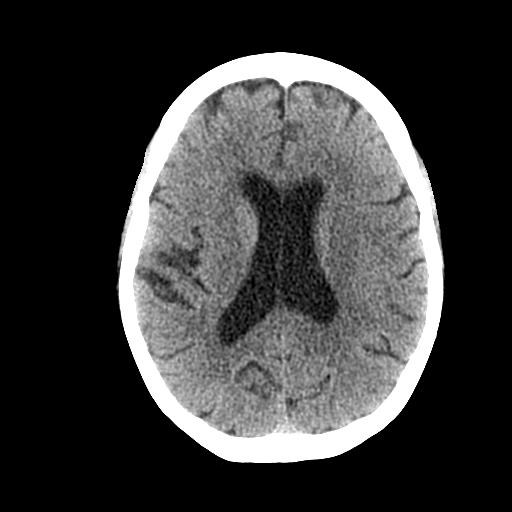
[im 35/53  brain]
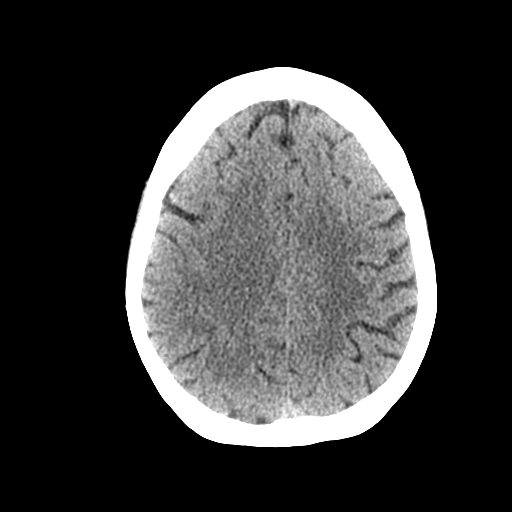
[im 40/53  brain]
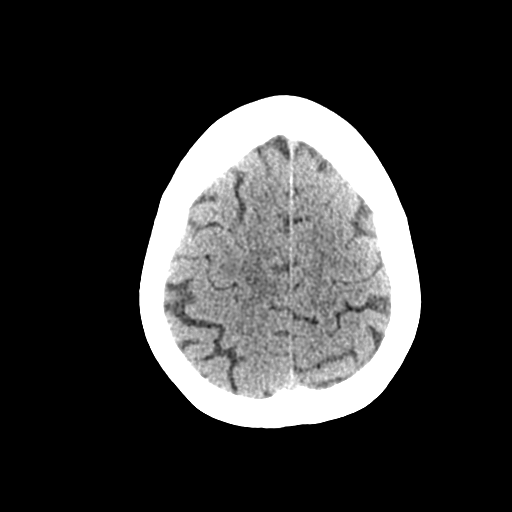
[im 44/53  brain]
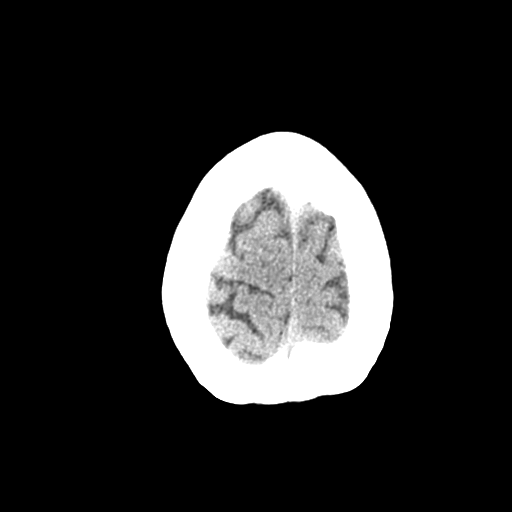
[im 44/53  bone]
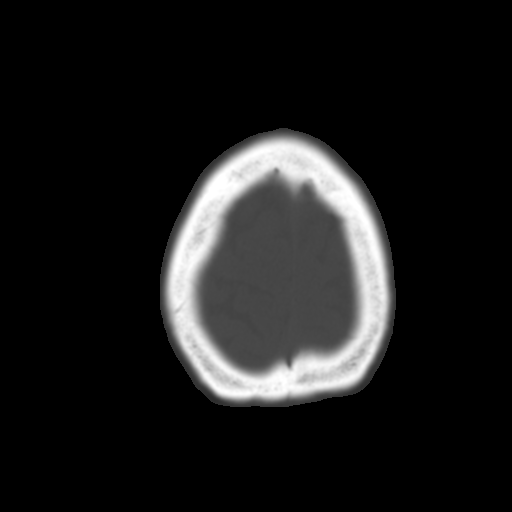
[im 49/53  brain]
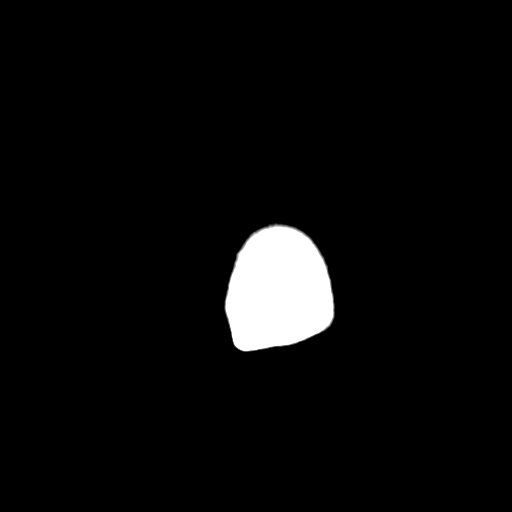

[Series 5: coronal soft tissue · coronal · 0.31mm/px · 3 of 67 slices shown]
[im 23/67  brain]
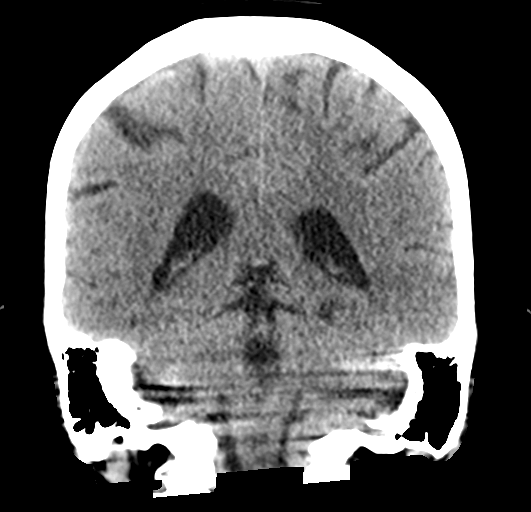
[im 30/67  brain]
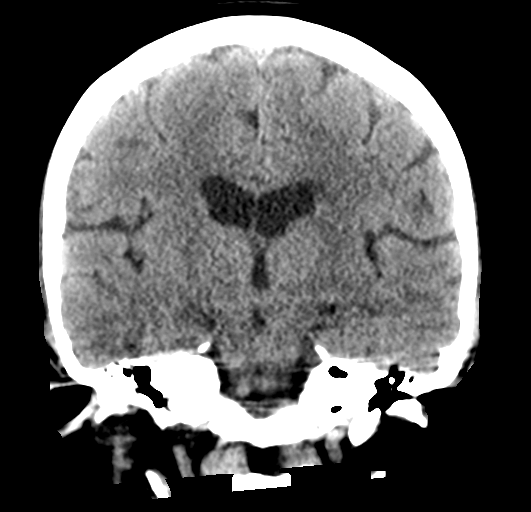
[im 37/67  brain]
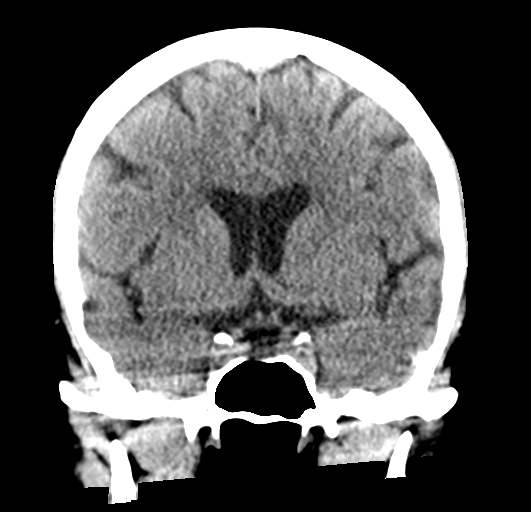

[Series 6: sagittal soft tissue · sagittal · 0.31mm/px · 3 of 55 slices shown]
[im 19/55  brain]
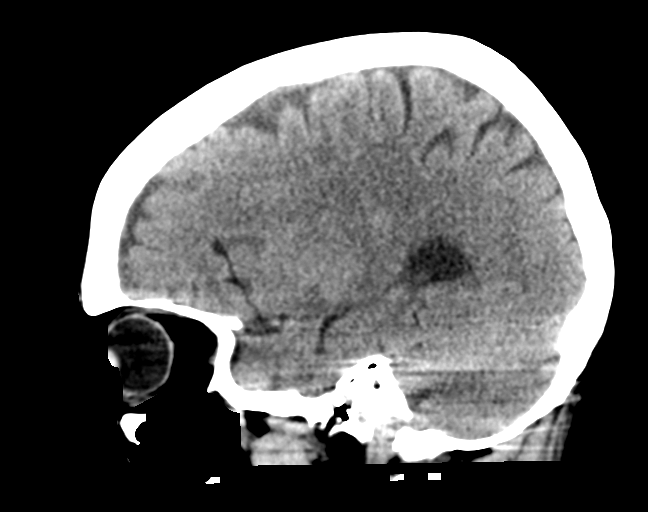
[im 28/55  brain]
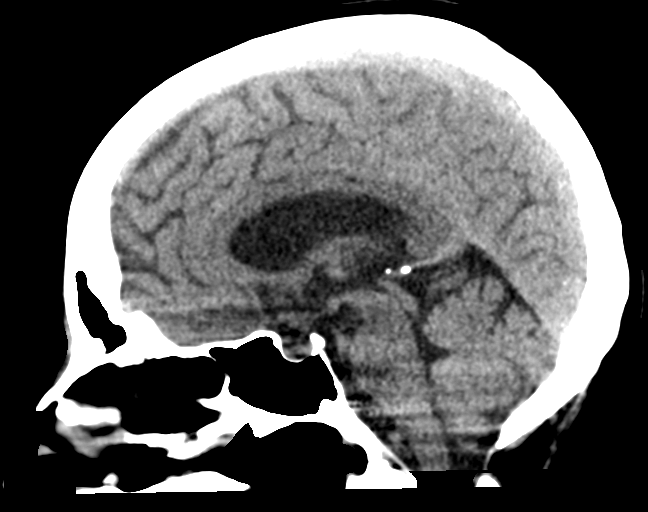
[im 37/55  brain]
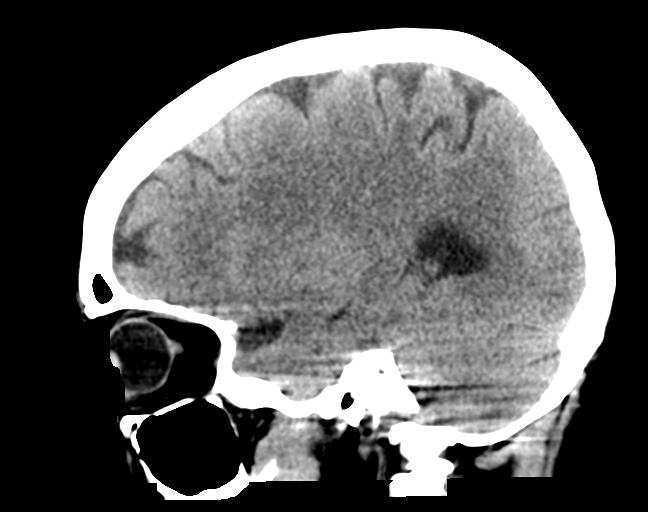

[16 of 47 positions shown; findings below may reference images not displayed]

FINDINGS: Brain: There is no evidence for acute hemorrhage, hydrocephalus,
mass lesion, or abnormal extra-axial fluid collection. No definite
CT evidence for acute infarction.

Vascular: No hyperdense vessel or unexpected calcification.

Skull: No evidence for fracture. No worrisome lytic or sclerotic
lesion.

Sinuses/Orbits: The visualized paranasal sinuses and mastoid air
cells are clear. Visualized portions of the globes and intraorbital
fat are unremarkable.

Other: None.
IMPRESSION: No acute intracranial abnormality.

## 2022-01-05 MED ORDER — LORAZEPAM 2 MG/ML IJ SOLN
0.5000 mg | Freq: Once | INTRAMUSCULAR | Status: AC
  Administered 2022-01-05: 0.5 mg via INTRAVENOUS
  Filled 2022-01-05: qty 1

## 2022-01-05 MED ORDER — ACETAMINOPHEN 325 MG PO TABS
650.0000 mg | ORAL_TABLET | Freq: Four times a day (QID) | ORAL | Status: DC | PRN
  Administered 2022-01-05 – 2022-01-06 (×3): 650 mg via ORAL
  Filled 2022-01-05 (×3): qty 2

## 2022-01-05 MED ORDER — METHYLPHENIDATE HCL 20 MG PO TABS: 20.0000 mg | ORAL_TABLET | Freq: Three times a day (TID) | ORAL | Status: DC

## 2022-01-05 MED ORDER — POTASSIUM CHLORIDE IN NACL 20-0.9 MEQ/L-% IV SOLN
INTRAVENOUS | Status: AC
  Filled 2022-01-05 (×3): qty 1000

## 2022-01-05 MED ORDER — SODIUM CHLORIDE 0.9 % IV SOLN
12.5000 mg | Freq: Once | INTRAVENOUS | Status: DC
  Filled 2022-01-05: qty 0.5

## 2022-01-05 MED ORDER — POTASSIUM CHLORIDE IN NACL 40-0.9 MEQ/L-% IV SOLN
INTRAVENOUS | Status: AC
Start: 2022-01-05 — End: 2022-01-05
  Filled 2022-01-05: qty 1000

## 2022-01-05 MED ORDER — PILOCARPINE HCL 5 MG PO TABS
5.0000 mg | ORAL_TABLET | Freq: Four times a day (QID) | ORAL | Status: DC
  Administered 2022-01-05 – 2022-01-07 (×7): 5 mg via ORAL
  Filled 2022-01-05 (×8): qty 1

## 2022-01-05 MED ORDER — ALUM & MAG HYDROXIDE-SIMETH 200-200-20 MG/5ML PO SUSP
30.0000 mL | ORAL | Status: DC | PRN
  Administered 2022-01-05 (×2): 30 mL via ORAL
  Filled 2022-01-05 (×2): qty 30

## 2022-01-05 MED ORDER — LEVOTHYROXINE SODIUM 88 MCG PO TABS
88.0000 ug | ORAL_TABLET | Freq: Every day | ORAL | Status: DC
  Administered 2022-01-06 – 2022-01-07 (×2): 88 ug via ORAL
  Filled 2022-01-05 (×2): qty 1

## 2022-01-05 MED ORDER — ONDANSETRON HCL 4 MG/2ML IJ SOLN
4.0000 mg | Freq: Once | INTRAMUSCULAR | Status: AC | PRN
  Administered 2022-01-05: 4 mg via INTRAVENOUS
  Filled 2022-01-05: qty 2

## 2022-01-05 MED ORDER — PANTOPRAZOLE SODIUM 40 MG IV SOLR
40.0000 mg | Freq: Once | INTRAVENOUS | Status: AC
  Administered 2022-01-05: 40 mg via INTRAVENOUS
  Filled 2022-01-05: qty 10

## 2022-01-05 MED ORDER — LORAZEPAM 1 MG PO TABS
1.0000 mg | ORAL_TABLET | Freq: Two times a day (BID) | ORAL | Status: DC | PRN
  Administered 2022-01-05 – 2022-01-06 (×4): 1 mg via ORAL
  Filled 2022-01-05 (×5): qty 1

## 2022-01-05 MED ORDER — ALUM & MAG HYDROXIDE-SIMETH 200-200-20 MG/5ML PO SUSP: 30.0000 mL | ORAL | Status: DC | PRN

## 2022-01-05 MED ORDER — GUANFACINE HCL 1 MG PO TABS
2.0000 mg | ORAL_TABLET | Freq: Two times a day (BID) | ORAL | Status: DC
  Administered 2022-01-05 – 2022-01-07 (×4): 2 mg via ORAL
  Filled 2022-01-05 (×4): qty 2

## 2022-01-05 MED ORDER — FENTANYL CITRATE PF 50 MCG/ML IJ SOSY: 50.0000 ug | PREFILLED_SYRINGE | INTRAMUSCULAR | Status: DC | PRN

## 2022-01-05 MED ORDER — LIDOCAINE VISCOUS HCL 2 % MT SOLN
15.0000 mL | OROMUCOSAL | Status: DC | PRN
  Filled 2022-01-05: qty 15

## 2022-01-05 MED ORDER — SODIUM CHLORIDE 0.9 % IV BOLUS
1000.0000 mL | Freq: Once | INTRAVENOUS | Status: AC
  Administered 2022-01-05: 1000 mL via INTRAVENOUS

## 2022-01-05 MED ORDER — ACETAMINOPHEN 650 MG RE SUPP: 650.0000 mg | Freq: Four times a day (QID) | RECTAL | Status: DC | PRN

## 2022-01-05 MED ORDER — PROCHLORPERAZINE EDISYLATE 10 MG/2ML IJ SOLN
5.0000 mg | INTRAMUSCULAR | Status: DC | PRN
  Administered 2022-01-05 (×3): 5 mg via INTRAVENOUS
  Filled 2022-01-05 (×3): qty 2

## 2022-01-05 MED ORDER — MAGNESIUM SULFATE 2 GM/50ML IV SOLN
2.0000 g | Freq: Once | INTRAVENOUS | Status: AC
  Administered 2022-01-05: 2 g via INTRAVENOUS
  Filled 2022-01-05: qty 50

## 2022-01-05 MED ORDER — POTASSIUM CHLORIDE IN NACL 20-0.9 MEQ/L-% IV SOLN: INTRAVENOUS | Status: DC

## 2022-01-05 MED ORDER — SODIUM CHLORIDE 0.9 % IV SOLN
1.0000 g | INTRAVENOUS | Status: DC
  Administered 2022-01-05: 1 g via INTRAVENOUS
  Filled 2022-01-05: qty 10

## 2022-01-05 MED ORDER — ASPIRIN EC 81 MG PO TBEC
81.0000 mg | DELAYED_RELEASE_TABLET | Freq: Every morning | ORAL | Status: DC
  Administered 2022-01-06 – 2022-01-07 (×2): 81 mg via ORAL
  Filled 2022-01-05 (×2): qty 1

## 2022-01-05 MED ORDER — BUPROPION HCL ER (XL) 300 MG PO TB24
300.0000 mg | ORAL_TABLET | Freq: Every morning | ORAL | Status: DC
Start: 2022-01-06 — End: 2022-01-07
  Administered 2022-01-06 – 2022-01-07 (×2): 300 mg via ORAL
  Filled 2022-01-05 (×2): qty 1

## 2022-01-05 MED ORDER — SERTRALINE HCL 50 MG PO TABS
50.0000 mg | ORAL_TABLET | Freq: Every day | ORAL | Status: DC
  Administered 2022-01-05 – 2022-01-07 (×3): 50 mg via ORAL
  Filled 2022-01-05 (×3): qty 1

## 2022-01-05 NOTE — ED Provider Notes (Signed)
?[facility] DEPT ?Provider Note ? ? ?CSN: [id number] ?Arrival date & time: 01/05/22  0048 ? ?  ? ?History ? ?Chief Complaint  ?Patient presents with  ? Emesis  ? ? ?Dorothy Wood is a 61 y.o. female. ? ?The history is provided by the patient and a relative.  ?Emesis ?Severity:  Severe ?Progression:  Worsening ?Chronicity:  Recurrent ?Relieved by:  Nothing ?Associated symptoms: abdominal pain and diarrhea   ?Associated symptoms: no fever   ?Patient with history of hypertension, hyperlipidemia, previous esophagitis presents with vomiting.  Patient reports around dinnertime she began having nausea and vomiting with nonbloody emesis.  She also reports she had some loose stools earlier in the day that were nonbloody.  She reports abdominal pain.  She has had these episodes previously. ?  ?Past Medical History:  ?Diagnosis Date  ? Cancer [facility])   ? OVARIAN  ? Colonic polyp   ? Gastritis   ? Hyperlipidemia   ? Hypertension   ? Sjogren's disease ([place])   ? Stroke [facility])   ? Thyroid disease   ? HYPOTHYROIDISM  ? TIA (transient ischemic attack) 04/06/2021  ? ? ?Home Medications ?Prior to Admission medications   ?Medication Sig Start Date End Date Taking? Authorizing Provider  ?aspirin EC 81 MG tablet Take 81 mg by mouth every morning. Swallow whole.    [provider]  ?atorvastatin (LIPITOR) 80 MG tablet Take 1 tablet (80 mg total) by mouth daily. 10/09/21 10/09/22  Denita Lung, MD  ?buPROPion (WELLBUTRIN XL) 300 MG 24 hr tablet Take 1 tablet (300 mg total) by mouth every morning. 12/03/21     ?guanFACINE (TENEX) 2 MG tablet Take 1 tablet by mouth every morning and 1 tablet by mouth every night at bedtime 07/03/21     ?guanFACINE (TENEX) 2 MG tablet Take 1 tablet by mouth every morning and 1 tablet at bedtime 12/03/21     ?hydrochlorothiazide (HYDRODIURIL) 25 MG tablet TAKE 1 TABLET BY MOUTH ONCE DAILY 08/26/21 08/26/22  Lelon Perla, MD  ?levothyroxine (EUTHYROX) 88 MCG tablet TAKE  1 TABLET BY MOUTH ONCE A DAY 12/23/21     ?levothyroxine (SYNTHROID) 88 MCG tablet TAKE 1 TABLET BY MOUTH ONCE DAILY 05/24/20 11/26/21  Vania Rea, MD  ?LORazepam (ATIVAN) 1 MG tablet Take 1 tablet by mouth daily as needed for anxiety (30 day supply) 12/03/21     ?LORazepam (ATIVAN) 1 MG tablet Take 1 tablet by mouth daily as needed for anxiety (30 day supply) 12/25/21     ?methylphenidate (RITALIN) 20 MG tablet Take 1 tablet by mouth 3 times daily 07/03/21     ?methylphenidate (RITALIN) 20 MG tablet Take 1 tablet (20 mg total) by mouth 3 (three) times daily. 12/03/21     ?mirabegron ER (MYRBETRIQ) 50 MG TB24 tablet Take 1 tablet by mouth daily 12/08/21     ?pilocarpine (SALAGEN) 5 MG tablet TAKE 1 TABLET BY MOUTH 4 TIMES DAILY 12/09/20 01/22/22  Denita Lung, MD  ?polyethylene glycol (MIRALAX / GLYCOLAX) 17 g packet Take 8.5 g by mouth daily. Mix in liquid and drink    [provider]  ?sertraline (ZOLOFT) 50 MG tablet Take 1/2 tablet by mouth every morning for 1 week then take 1 tablet every morning 12/03/21     ?sertraline (ZOLOFT) 50 MG tablet Take 1 tablet by mouth in the morning 12/25/21     ?estradiol (ESTRACE) 0.5 MG tablet TAKE 1 TABLET BY MOUTH ONCE A DAY ?Patient not taking:  No sig reported 06/03/20 04/07/21  Vania Rea, MD  ?   ? ?Allergies    ?Patient has no known allergies.   ? ?Review of Systems   ?Review of Systems  ?Constitutional:  Negative for fever.  ?Gastrointestinal:  Positive for abdominal pain, diarrhea and vomiting. Negative for blood in stool.  ? ?Physical Exam ?Updated Vital Signs ?BP 137/75   Pulse 82   Temp (!) 95.9 ?F (35.5 ?C) (Rectal)   Resp 18   Ht 1.651 m ('5\' 5"'$ )   Wt 56.7 kg   SpO2 100%   BMI 20.80 kg/m?  ?Physical Exam ?CONSTITUTIONAL: Uncomfortable appearing, anxious ?HEAD: Normocephalic/atraumatic ?EYES: EOMI/PERRL, no icterus ?ENMT: Mucous membranes dry ?NECK: supple no meningeal signs ?SPINE/BACK:entire spine nontender ?CV: S1/S2 noted, no murmurs/rubs/gallops  noted ?LUNGS: Lungs are clear to auscultation bilaterally, no apparent distress ?ABDOMEN: soft, mild diffuse tenderness, no rebound or guarding, bowel sounds noted throughout abdomen, nondistended ?GU:no cva tenderness ?NEURO: Pt is awake/alert/appropriate, moves all extremitiesx4.  No facial droop.   ?EXTREMITIES: pulses normal/equal, full ROM ?SKIN: warm, color normal ?PSYCH: Anxious ?ED Results / Procedures / Treatments   ?Labs ?(all labs ordered are listed, but only abnormal results are displayed) ?Labs Reviewed  ?COMPREHENSIVE METABOLIC PANEL - Abnormal; Notable for the following components:  ?    Result Value  ? Glucose, Bld 183 (*)   ? All other components within normal limits  ?CBC - Abnormal; Notable for the following components:  ? WBC 15.1 (*)   ? All other components within normal limits  ?URINALYSIS, ROUTINE W REFLEX MICROSCOPIC - Abnormal; Notable for the following components:  ? APPearance HAZY (*)   ? pH 9.0 (*)   ? Ketones, ur 20 (*)   ? Leukocytes,Ua LARGE (*)   ? Bacteria, UA RARE (*)   ? All other components within normal limits  ?MAGNESIUM - Abnormal; Notable for the following components:  ? Magnesium 1.5 (*)   ? All other components within normal limits  ?LIPASE, BLOOD  ?ETHANOL  ?RAPID URINE DRUG SCREEN, HOSP PERFORMED  ? ? ?EKG ?EKG Interpretation ? ?Date/Time:  Monday January 05 2022 00:56:52 EDT ?Ventricular Rate:  82 ?PR Interval:  161 ?QRS Duration: 105 ?QT Interval:  437 ?QTC Calculation: 511 ?R Axis:   -22 ?Text Interpretation: Sinus rhythm Borderline left axis deviation RSR' in V1 or V2, right VCD or RVH Prolonged QT interval Interpretation limited secondary to artifact Confirmed by Ripley Fraise [phone]) on 01/05/2022 1:36:28 AM ? ?Radiology ?CT Head Wo Contrast ? ?Result Date: 01/05/2022 ?CLINICAL DATA:  Delirium. EXAM: CT HEAD WITHOUT CONTRAST TECHNIQUE: Contiguous axial images were obtained from the base of the skull through the vertex without intravenous contrast. RADIATION DOSE  REDUCTION: This exam was performed according to the departmental dose-optimization program which includes automated exposure control, adjustment of the mA and/or kV according to patient size and/or use of iterative reconstruction technique. COMPARISON:  04/06/2021 FINDINGS: Brain: There is no evidence for acute hemorrhage, hydrocephalus, mass lesion, or abnormal extra-axial fluid collection. No definite CT evidence for acute infarction. Vascular: No hyperdense vessel or unexpected calcification. Skull: No evidence for fracture. No worrisome lytic or sclerotic lesion. Sinuses/Orbits: The visualized paranasal sinuses and mastoid air cells are clear. Visualized portions of the globes and intraorbital fat are unremarkable. Other: None. IMPRESSION: No acute intracranial abnormality. Electronically Signed   By: Misty Stanley M.D.   On: 01/05/2022 05:45   ? ?Procedures ?Procedures  ? ? ?Medications Ordered in ED ?Medications  ?cefTRIAXone (ROCEPHIN) 1  g in sodium chloride 0.9 % 100 mL IVPB (has no administration in time range)  ?ondansetron [facility]) injection 4 mg (4 mg Intravenous Given 01/05/22 0214)  ?sodium chloride 0.9 % bolus 1,000 mL (0 mLs Intravenous Stopped 01/05/22 0350)  ?LORazepam (ATIVAN) injection 0.5 mg (0.5 mg Intravenous Given 01/05/22 0212)  ?magnesium sulfate IVPB 2 g 50 mL (0 g Intravenous Stopped 01/05/22 0507)  ?pantoprazole (PROTONIX) injection 40 mg (40 mg Intravenous Given 01/05/22 0304)  ? ? ?ED Course/ Medical Decision Making/ A&P ?Clinical Course as of 01/05/22 0625  ?Mon Jan 05, 2022  ?0207 WBC(!): 15.1 ?Leukocytosis [DW]  ?0242 Glucose(!): 183 ?Mild hyperglycemia ? [DW]  ?2426 Magnesium(!): 1.5 ?Hypomagnesemia noted [DW]  ?0256 Patient presents with nausea vomiting and had some diarrhea.  Records reveal she was admitted with for similar episode last year.  She had endoscopy that revealed esophagitis.  She reports this feels similar to prior episode.  She has had prolonged QT before which limits  antiemetics.  Patient appears extremely anxious, will start Ativan [DW]  ?0520 Patient now acting confused.  She has been speaking incoherently.  No focal weakness.  Could be related to the Ativan.  Will obtain CT hea

## 2022-01-05 NOTE — Progress Notes (Signed)
[facility] admitting physician addendum: ? ?Ceftriaxone was not continue as the patient was having asymptomatic bacteriuria.  She denied flank pain, dysuria, frequency or hematuria. ? ?Tennis Must, MD. ?

## 2022-01-05 NOTE — H&P (Signed)
?History and Physical  ? ? ?Patient: Dorothy Wood ION:[id number] DOB: 1960/12/07 ?DOA: 01/05/2022 ?DOS: the patient was seen and examined on 01/05/2022 ?PCP: Denita Lung, MD  ?Patient coming from: Home ? ?Chief Complaint:  ?Chief Complaint  ?Patient presents with  ? Emesis  ? ?HPI: Dorothy Wood is a 61 y.o. female with medical history significant of ovarian cancer, colon polyp, gastritis, hyperlipidemia, hypertension, Sjogren's disease, history of TIA, hypothyroidism who is coming to the emergency department with complaints of multiple episodes of nausea and vomiting that started at 2200 yesterday evening associated with abdominal pain, diarrhea, midline chest pain,  fatigue and malaise.  No travel history or sick contacts.  Her dinner was freshly prepared yesterday.  No fever, no night sweats, but positive chills.  No sore throat, dyspnea, wheezing or hemoptysis.  No diaphoresis, palpitations, PND, orthopnea or pitting edema of the lower extremities.  No flank pain, dysuria, frequency or hematuria.  No polyuria, polydipsia, polyphagia or blurred vision. ? ?ED course: Initial vital signs were temperature 95.9 ?F, pulse 81, respirations 23, BP 105/68 mmHg O2 sat 100% on room air.  The patient received normal saline 1000 mL bolus, Protonix 40 mg IVP, magnesium sulfate 2 g IVP ED, ondansetron 4 mg IVP and lorazepam 0.5 mg IVP.  She became transiently confused while in the ER, but back to baseline at this time. ? ?Lab work:  urinalysis was hazy with a pH of 9.0, ketonuria 20 mg/dL, large leukocyte esterase, WBC of 21-50 and rare bacteria on microscopic examination.  UDS was negative.  CBC had a white count 15.1, hemoglobin 13.6 g/dL platelets 325.  CMP with a glucose of 183 mg/dL but were otherwise normal.  Magnesium levels 1.5 mg/dL.  Lipase and alcohol level were normal. ? ?Imaging: CT head without contrast was normal. ?  ?Review of Systems: As mentioned in the history of present illness. All other systems  reviewed and are negative. ?Past Medical History:  ?Diagnosis Date  ? Cancer [facility])   ? OVARIAN  ? Colonic polyp   ? Gastritis   ? Hyperlipidemia   ? Hypertension   ? Sjogren's disease ([place])   ? Stroke [facility])   ? Thyroid disease   ? HYPOTHYROIDISM  ? TIA (transient ischemic attack) 04/06/2021  ? ?Past Surgical History:  ?Procedure Laterality Date  ? ABDOMINAL HYSTERECTOMY    ? Ovarian cancer  ? BIOPSY  02/04/2021  ? Procedure: BIOPSY;  Surgeon: Ronald Lobo, MD;  Location: [facility];  Service: Endoscopy;;  ? BREAST CYST ASPIRATION    ? CESAREAN SECTION    ? x2  ? COLONOSCOPY  10-06  ? Dr. Earnest Bailey  ? ESOPHAGOGASTRODUODENOSCOPY N/A 02/04/2021  ? Procedure: ESOPHAGOGASTRODUODENOSCOPY (EGD);  Surgeon: Ronald Lobo, MD;  Location: [facility];  Service: Endoscopy;  Laterality: N/A;  ? TONSILLECTOMY    ? ?Social History:  reports that she has never smoked. She has never used smokeless tobacco. She reports current alcohol use of about 4.0 standard drinks per week. She reports that she does not use drugs. ? ?No Known Allergies ? ?Family History  ?Problem Relation Age of Onset  ? Mental illness Mother   ? Hypertension Mother   ? Heart failure Mother   ? CAD Father   ?     CABG at age 40  ? Mental illness Sister   ? Alcoholism Sister   ? Mental illness Brother   ? Alcoholism Brother   ? Mental illness Maternal Uncle   ? Mental illness  Maternal Grandmother   ? Cancer Paternal Grandmother   ? ? ?Prior to Admission medications   ?Medication Sig Start Date End Date Taking? Authorizing Provider  ?aspirin EC 81 MG tablet Take 81 mg by mouth every morning. Swallow whole.    [provider]  ?atorvastatin (LIPITOR) 80 MG tablet Take 1 tablet (80 mg total) by mouth daily. 10/09/21 10/09/22  Denita Lung, MD  ?buPROPion (WELLBUTRIN XL) 300 MG 24 hr tablet Take 1 tablet (300 mg total) by mouth every morning. 12/03/21     ?guanFACINE (TENEX) 2 MG tablet Take 1 tablet by mouth every morning and 1 tablet by mouth every night  at bedtime 07/03/21     ?guanFACINE (TENEX) 2 MG tablet Take 1 tablet by mouth every morning and 1 tablet at bedtime 12/03/21     ?hydrochlorothiazide (HYDRODIURIL) 25 MG tablet TAKE 1 TABLET BY MOUTH ONCE DAILY 08/26/21 08/26/22  Lelon Perla, MD  ?levothyroxine (EUTHYROX) 88 MCG tablet TAKE 1 TABLET BY MOUTH ONCE A DAY 12/23/21     ?levothyroxine (SYNTHROID) 88 MCG tablet TAKE 1 TABLET BY MOUTH ONCE DAILY 05/24/20 11/26/21  Vania Rea, MD  ?LORazepam (ATIVAN) 1 MG tablet Take 1 tablet by mouth daily as needed for anxiety (30 day supply) 12/03/21     ?LORazepam (ATIVAN) 1 MG tablet Take 1 tablet by mouth daily as needed for anxiety (30 day supply) 12/25/21     ?methylphenidate (RITALIN) 20 MG tablet Take 1 tablet by mouth 3 times daily 07/03/21     ?methylphenidate (RITALIN) 20 MG tablet Take 1 tablet (20 mg total) by mouth 3 (three) times daily. 12/03/21     ?mirabegron ER (MYRBETRIQ) 50 MG TB24 tablet Take 1 tablet by mouth daily 12/08/21     ?pilocarpine (SALAGEN) 5 MG tablet TAKE 1 TABLET BY MOUTH 4 TIMES DAILY 12/09/20 01/22/22  Denita Lung, MD  ?polyethylene glycol (MIRALAX / GLYCOLAX) 17 g packet Take 8.5 g by mouth daily. Mix in liquid and drink    [provider]  ?sertraline (ZOLOFT) 50 MG tablet Take 1/2 tablet by mouth every morning for 1 week then take 1 tablet every morning 12/03/21     ?sertraline (ZOLOFT) 50 MG tablet Take 1 tablet by mouth in the morning 12/25/21     ?estradiol (ESTRACE) 0.5 MG tablet TAKE 1 TABLET BY MOUTH ONCE A DAY ?Patient not taking: No sig reported 06/03/20 04/07/21  Vania Rea, MD  ? ? ?Physical Exam: ?Vitals:  ? 01/05/22 0330 01/05/22 0430 01/05/22 0500 01/05/22 0515  ?BP: (!) 147/75 128/65 132/62 137/75  ?Pulse: 87 94 87 82  ?Resp: '18  17 18  '$ ?Temp:      ?TempSrc:      ?SpO2: 100% 100% 100% 100%  ?Weight:      ?Height:      ? ?Physical Exam ?Vitals and nursing note reviewed.  ?Constitutional:   ?   Appearance: Normal appearance. She is normal weight.  ?HENT:  ?    Head: Normocephalic.  ?   Mouth/Throat:  ?   Mouth: Mucous membranes are dry.  ?Eyes:  ?   General: No scleral icterus. ?   Pupils: Pupils are equal, round, and reactive to light.  ?Cardiovascular:  ?   Rate and Rhythm: Normal rate and regular rhythm.  ?Pulmonary:  ?   Effort: Pulmonary effort is normal.  ?   Breath sounds: Normal breath sounds. No wheezing, rhonchi or rales.  ?Abdominal:  ?   General: Bowel  sounds are normal. There is no distension.  ?   Palpations: Abdomen is soft.  ?   Tenderness: There is no abdominal tenderness.  ?Musculoskeletal:  ?   Cervical back: Neck supple.  ?   Right lower leg: No edema.  ?   Left lower leg: No edema.  ?Skin: ?   General: Skin is warm and dry.  ?Neurological:  ?   General: No focal deficit present.  ?   Mental Status: She is alert and oriented to person, place, and time.  ?Psychiatric:     ?   Mood and Affect: Mood normal.     ?   Behavior: Behavior normal.  ? ?Data Reviewed: ? ?There are no new results to review at this time. ? ?Assessment and Plan: ?Principal Problem: ?  Intractable nausea and vomiting ?Observation/telemetry. ?Continue IV fluids. ?Continue electrolyte replacement. ?Will defer further pantoprazole given QT prolongation. ?Use low-dose prochlorperazine as needed for N/V. ?Magic mouthwash as needed for reflux/odynophagia. ? ?Active Problems: ?  Prolonged QT interval ?Magnesium and potassium supplemented. ?Avoid medications that cause QT prolongation. ?Continue cardiac telemetry. ?Check EKG in the morning. ? ?  Hypomagnesemia ?Supplemented. ?Follow-up a.m. level. ? ?  Glucose intolerance (impaired glucose tolerance) ?Check hemoglobin A1c. ? ?  Sjogren's syndrome ([place]) ?Continue pilocarpine. ?Follow-up with rheumatology as scheduled. ? ?  ADD (attention deficit disorder) ?Continue rivaroxaban 20 mg p.o. 3 times daily. ?OP follow-up with PCP and/or [facility]. ? ?  Hypothyroidism ?Continue levothyroxine 88 mcg p.o. daily. ? ?  Essential hypertension ?Continue  guanfacine 2 mg p.o. twice daily. ?Monitor blood pressure and heart rate. ? ?  Hyperlipidemia, unspecified ?She has been holding atorvastatin. ?Patient to resume at home once symptoms are better. ? ? Advance C

## 2022-01-05 NOTE — ED Triage Notes (Signed)
Pt came in with n/v that started at 2200. Pt states that she has chest pain as well. Pt states she has been seen for same, and has a hx of dehydration. Took one zofran prior to arrival ?

## 2022-01-05 NOTE — ED Notes (Signed)
Attempted to get Urine sample via bed pan. Pt started to get anxious, hyperventilate and say incoherent sentences. Did not get urine ?

## 2022-01-06 ENCOUNTER — Observation Stay (HOSPITAL_COMMUNITY): Payer: 59

## 2022-01-06 ENCOUNTER — Inpatient Hospital Stay (HOSPITAL_COMMUNITY): Payer: 59

## 2022-01-06 DIAGNOSIS — Z8543 Personal history of malignant neoplasm of ovary: Secondary | ICD-10-CM | POA: Diagnosis not present

## 2022-01-06 DIAGNOSIS — N3000 Acute cystitis without hematuria: Secondary | ICD-10-CM | POA: Diagnosis present

## 2022-01-06 DIAGNOSIS — Z86010 Personal history of colonic polyps: Secondary | ICD-10-CM | POA: Diagnosis not present

## 2022-01-06 DIAGNOSIS — Z79899 Other long term (current) drug therapy: Secondary | ICD-10-CM | POA: Diagnosis not present

## 2022-01-06 DIAGNOSIS — R410 Disorientation, unspecified: Secondary | ICD-10-CM | POA: Diagnosis present

## 2022-01-06 DIAGNOSIS — E785 Hyperlipidemia, unspecified: Secondary | ICD-10-CM | POA: Diagnosis present

## 2022-01-06 DIAGNOSIS — I10 Essential (primary) hypertension: Secondary | ICD-10-CM | POA: Diagnosis present

## 2022-01-06 DIAGNOSIS — Z8249 Family history of ischemic heart disease and other diseases of the circulatory system: Secondary | ICD-10-CM | POA: Diagnosis not present

## 2022-01-06 DIAGNOSIS — Z79890 Hormone replacement therapy (postmenopausal): Secondary | ICD-10-CM | POA: Diagnosis not present

## 2022-01-06 DIAGNOSIS — K209 Esophagitis, unspecified without bleeding: Secondary | ICD-10-CM | POA: Diagnosis present

## 2022-01-06 DIAGNOSIS — K8020 Calculus of gallbladder without cholecystitis without obstruction: Secondary | ICD-10-CM | POA: Diagnosis present

## 2022-01-06 DIAGNOSIS — E039 Hypothyroidism, unspecified: Secondary | ICD-10-CM | POA: Diagnosis present

## 2022-01-06 DIAGNOSIS — Z8673 Personal history of transient ischemic attack (TIA), and cerebral infarction without residual deficits: Secondary | ICD-10-CM | POA: Diagnosis not present

## 2022-01-06 DIAGNOSIS — F988 Other specified behavioral and emotional disorders with onset usually occurring in childhood and adolescence: Secondary | ICD-10-CM | POA: Diagnosis present

## 2022-01-06 DIAGNOSIS — I700 Atherosclerosis of aorta: Secondary | ICD-10-CM | POA: Diagnosis not present

## 2022-01-06 DIAGNOSIS — R1110 Vomiting, unspecified: Secondary | ICD-10-CM | POA: Diagnosis not present

## 2022-01-06 DIAGNOSIS — R112 Nausea with vomiting, unspecified: Secondary | ICD-10-CM | POA: Diagnosis not present

## 2022-01-06 DIAGNOSIS — R7302 Impaired glucose tolerance (oral): Secondary | ICD-10-CM | POA: Diagnosis present

## 2022-01-06 DIAGNOSIS — M3500 Sicca syndrome, unspecified: Secondary | ICD-10-CM | POA: Diagnosis present

## 2022-01-06 DIAGNOSIS — Z7982 Long term (current) use of aspirin: Secondary | ICD-10-CM | POA: Diagnosis not present

## 2022-01-06 DIAGNOSIS — K219 Gastro-esophageal reflux disease without esophagitis: Secondary | ICD-10-CM | POA: Diagnosis present

## 2022-01-06 DIAGNOSIS — R9431 Abnormal electrocardiogram [ECG] [EKG]: Secondary | ICD-10-CM | POA: Diagnosis present

## 2022-01-06 DIAGNOSIS — R8281 Pyuria: Secondary | ICD-10-CM

## 2022-01-06 LAB — CORTISOL: Cortisol, Plasma: 23.9 ug/dL

## 2022-01-06 LAB — CBC
HCT: 36.7 % (ref 36.0–46.0)
Hemoglobin: 12.2 g/dL (ref 12.0–15.0)
MCH: 30.4 pg (ref 26.0–34.0)
MCHC: 33.2 g/dL (ref 30.0–36.0)
MCV: 91.5 fL (ref 80.0–100.0)
Platelets: 243 10*3/uL (ref 150–400)
RBC: 4.01 MIL/uL (ref 3.87–5.11)
RDW: 13.1 % (ref 11.5–15.5)
WBC: 10.5 10*3/uL (ref 4.0–10.5)
nRBC: 0 % (ref 0.0–0.2)

## 2022-01-06 LAB — HEPATIC FUNCTION PANEL
ALT: 29 U/L (ref 0–44)
AST: 29 U/L (ref 15–41)
Albumin: 4 g/dL (ref 3.5–5.0)
Alkaline Phosphatase: 70 U/L (ref 38–126)
Bilirubin, Direct: <0.1 mg/dL (ref 0.0–0.2)
Total Bilirubin: 0.4 mg/dL (ref 0.3–1.2)
Total Protein: 6.2 g/dL — ABNORMAL LOW (ref 6.5–8.1)

## 2022-01-06 LAB — BASIC METABOLIC PANEL
Anion gap: 4 — ABNORMAL LOW (ref 5–15)
BUN: 12 mg/dL (ref 8–23)
CO2: 22 mmol/L (ref 22–32)
Calcium: 8.9 mg/dL (ref 8.9–10.3)
Chloride: 112 mmol/L — ABNORMAL HIGH (ref 98–111)
Creatinine, Ser: 0.63 mg/dL (ref 0.44–1.00)
GFR, Estimated: >60 mL/min (ref >60)
Glucose, Bld: 111 mg/dL — ABNORMAL HIGH (ref 70–99)
Potassium: 4.2 mmol/L (ref 3.5–5.1)
Sodium: 138 mmol/L (ref 135–145)

## 2022-01-06 LAB — PHOSPHORUS: Phosphorus: 2.8 mg/dL (ref 2.5–4.6)

## 2022-01-06 LAB — HIV ANTIBODY (ROUTINE TESTING W REFLEX): HIV Screen 4th Generation wRfx: NONREACTIVE

## 2022-01-06 LAB — MAGNESIUM: Magnesium: 2.2 mg/dL (ref 1.7–2.4)

## 2022-01-06 IMAGING — US US ABDOMEN LIMITED
1 series · 15 of 25 positions shown · non-contrast
Comparison: None.

CLINICAL DATA: Cholelithiasis

EXAM:
ULTRASOUND ABDOMEN LIMITED RIGHT UPPER QUADRANT

[Series 1: us abdomen limited ruq mc & wl · 54 acquisitions, 15 frames shown]
[im 1/54]
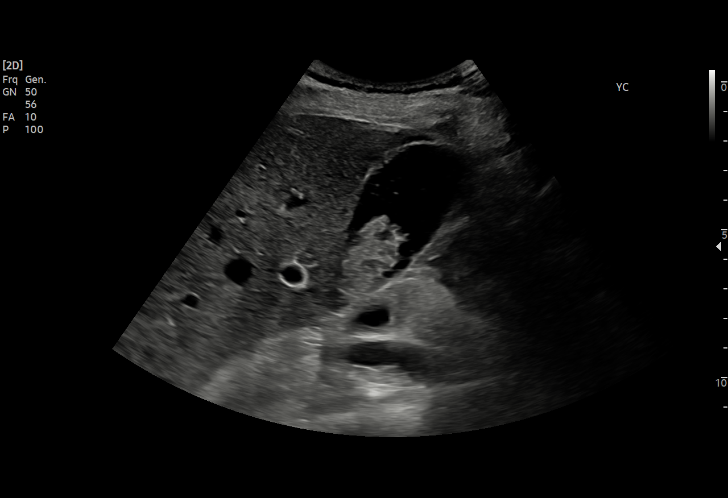
[im 5/54]
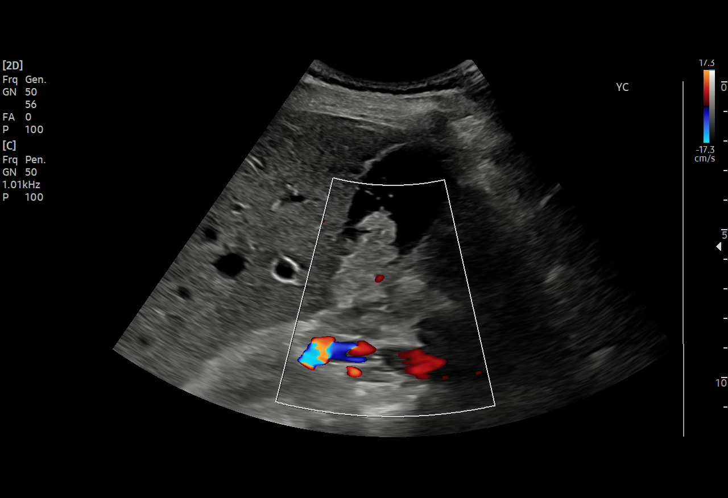
[im 9/54]
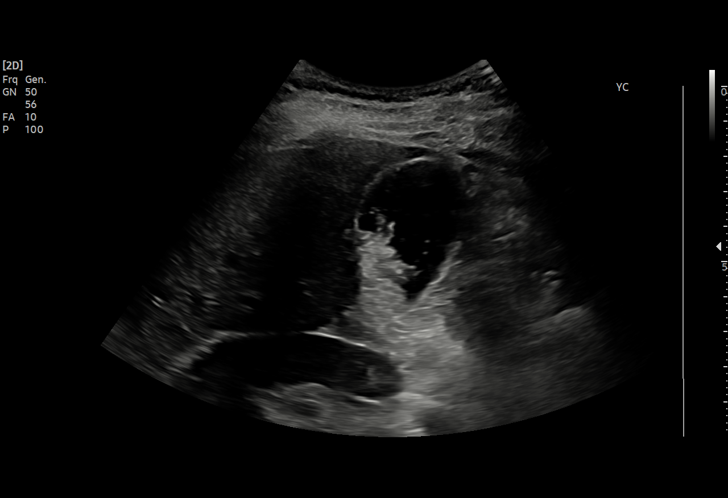
[im 12/54]
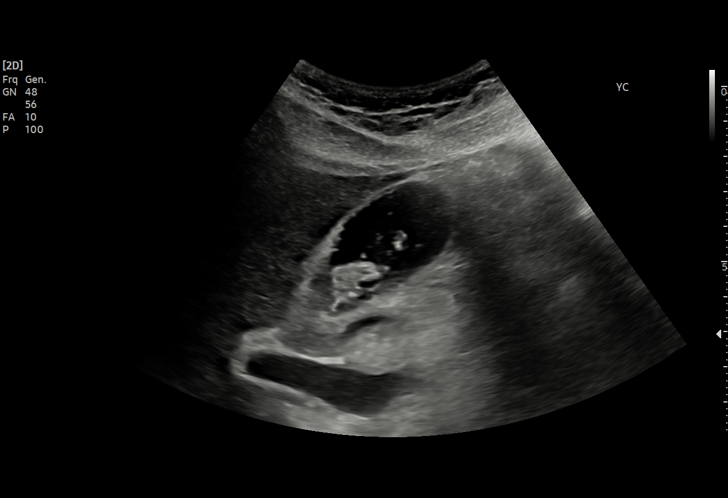
[im 16/54]
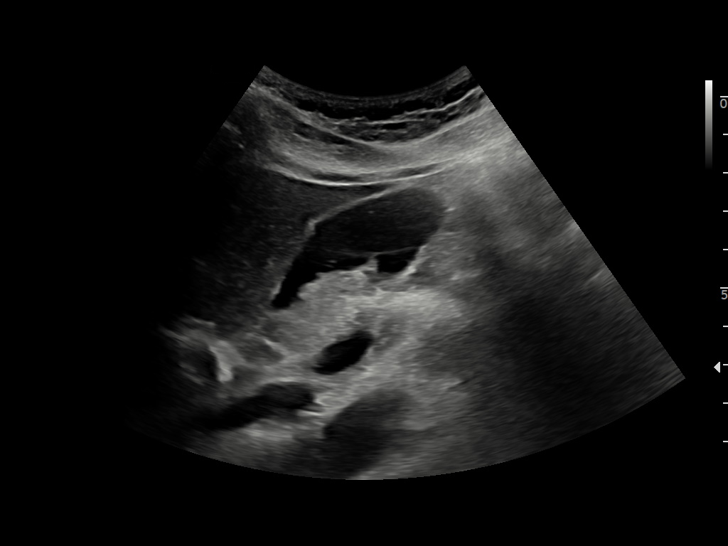
[im 20/54]
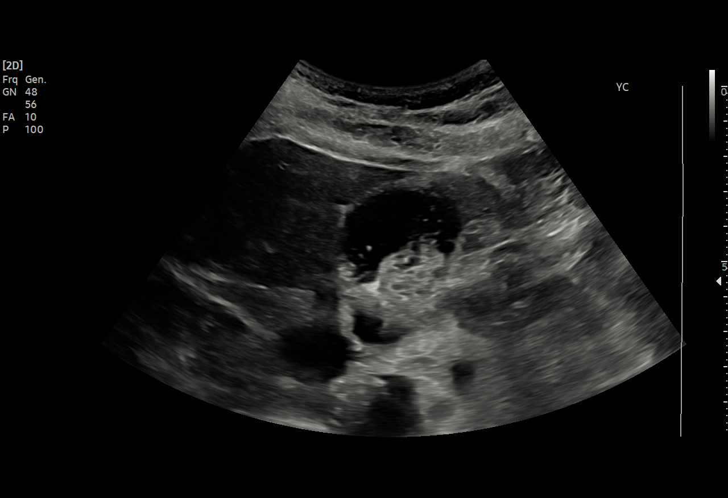
[im 23/54]
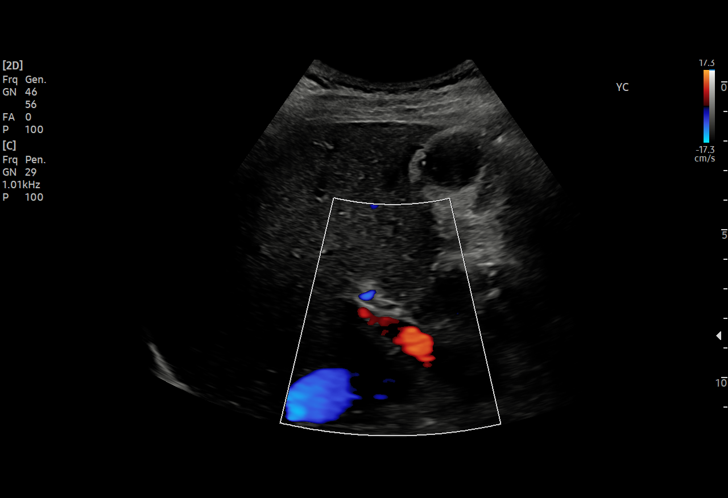
[im 27/54]
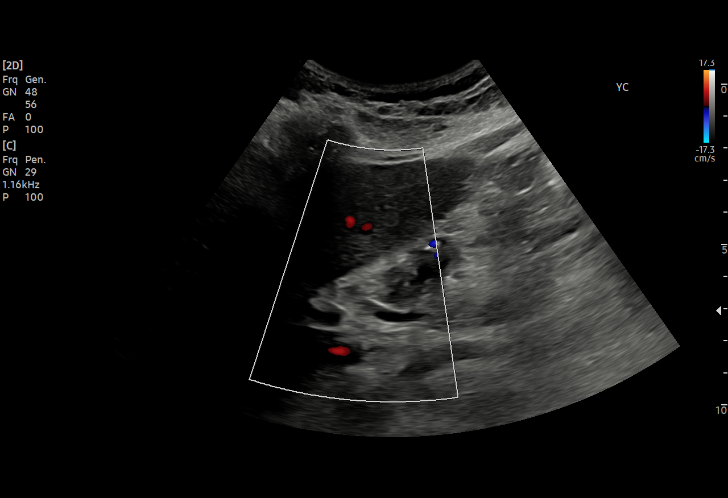
[im 31/54]
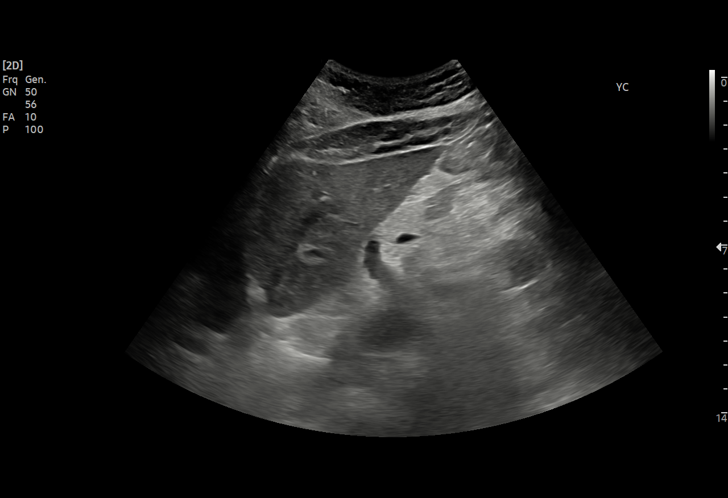
[im 34/54]
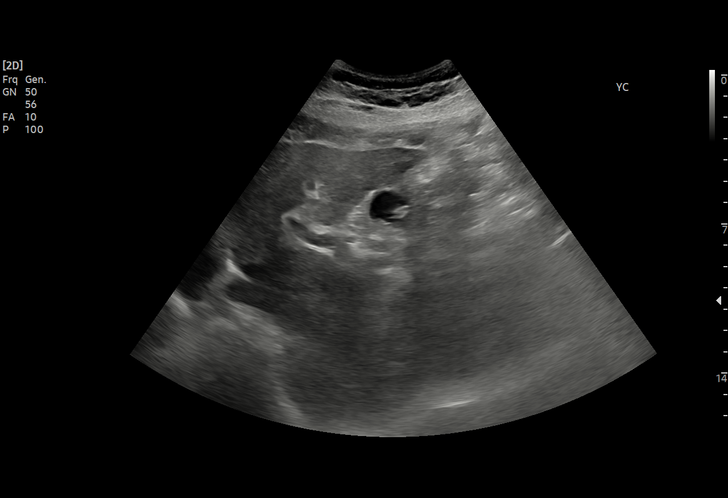
[im 38/54]
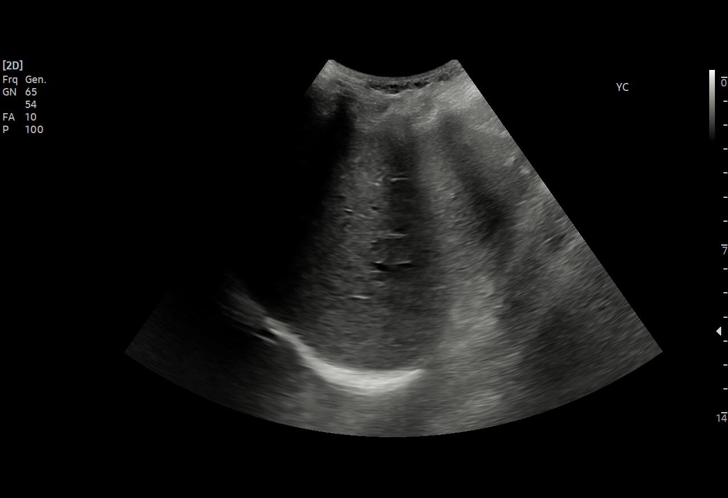
[im 42/54]
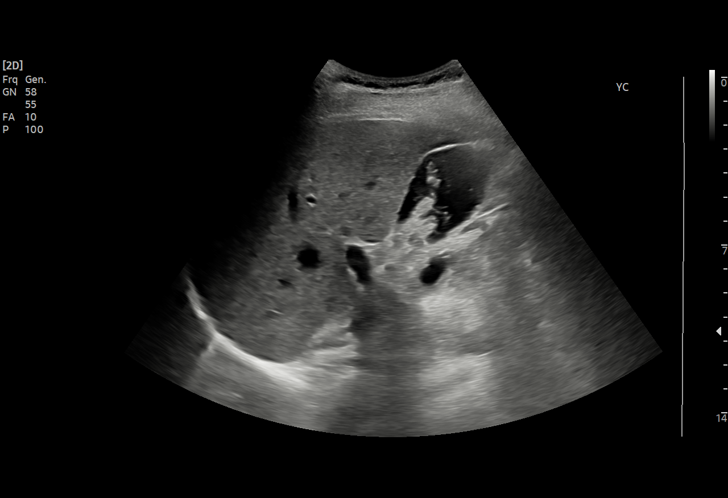
[im 45/54]
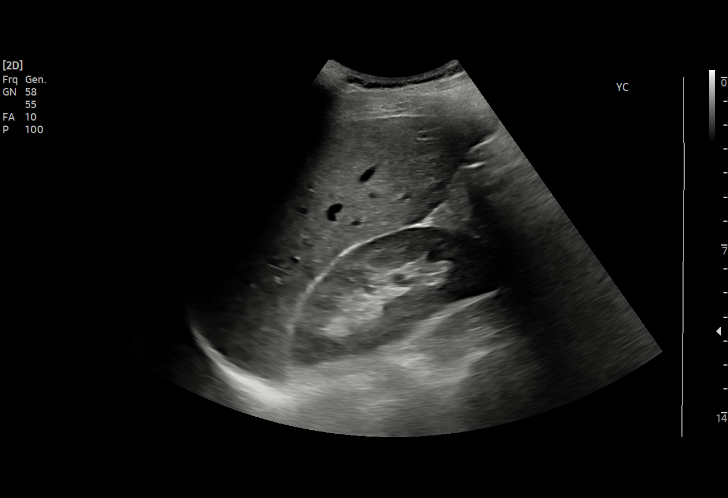
[im 49/54]
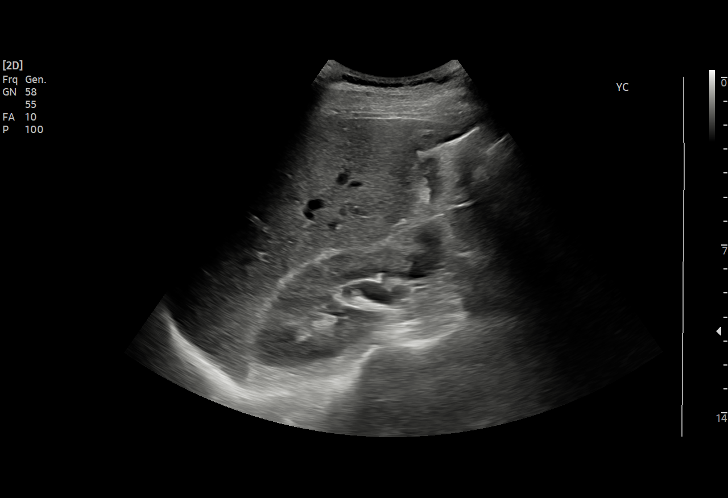
[im 54/54]
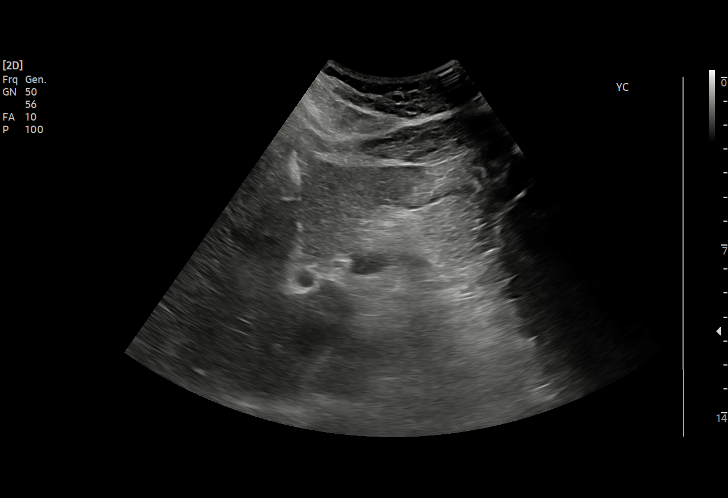

[15 of 25 positions shown; findings below may reference images not displayed]

FINDINGS: Gallbladder:

Extensive sludge is seen within the gallbladder. The gallbladder,
however, is not distended, there is no gallbladder wall thickening,
and no pericholecystic fluid is identified. The sonographic Murphy
sign is reportedly negative.

Common bile duct:

Diameter: 3 mm in proximal diameter

Liver:

No focal lesion identified. Within normal limits in parenchymal
echogenicity. Portal vein is patent on color Doppler imaging with
normal direction of blood flow towards the liver.

Other: None.
IMPRESSION: Cholelithiasis without sonographic evidence of acute cholecystitis

## 2022-01-06 IMAGING — DX DG CHEST 1V PORT
1 series · 1 of 1 positions shown · non-contrast
Comparison: [DATE]

CLINICAL DATA: Nausea, vomiting

EXAM:
PORTABLE CHEST 1 VIEW

[chest ap]
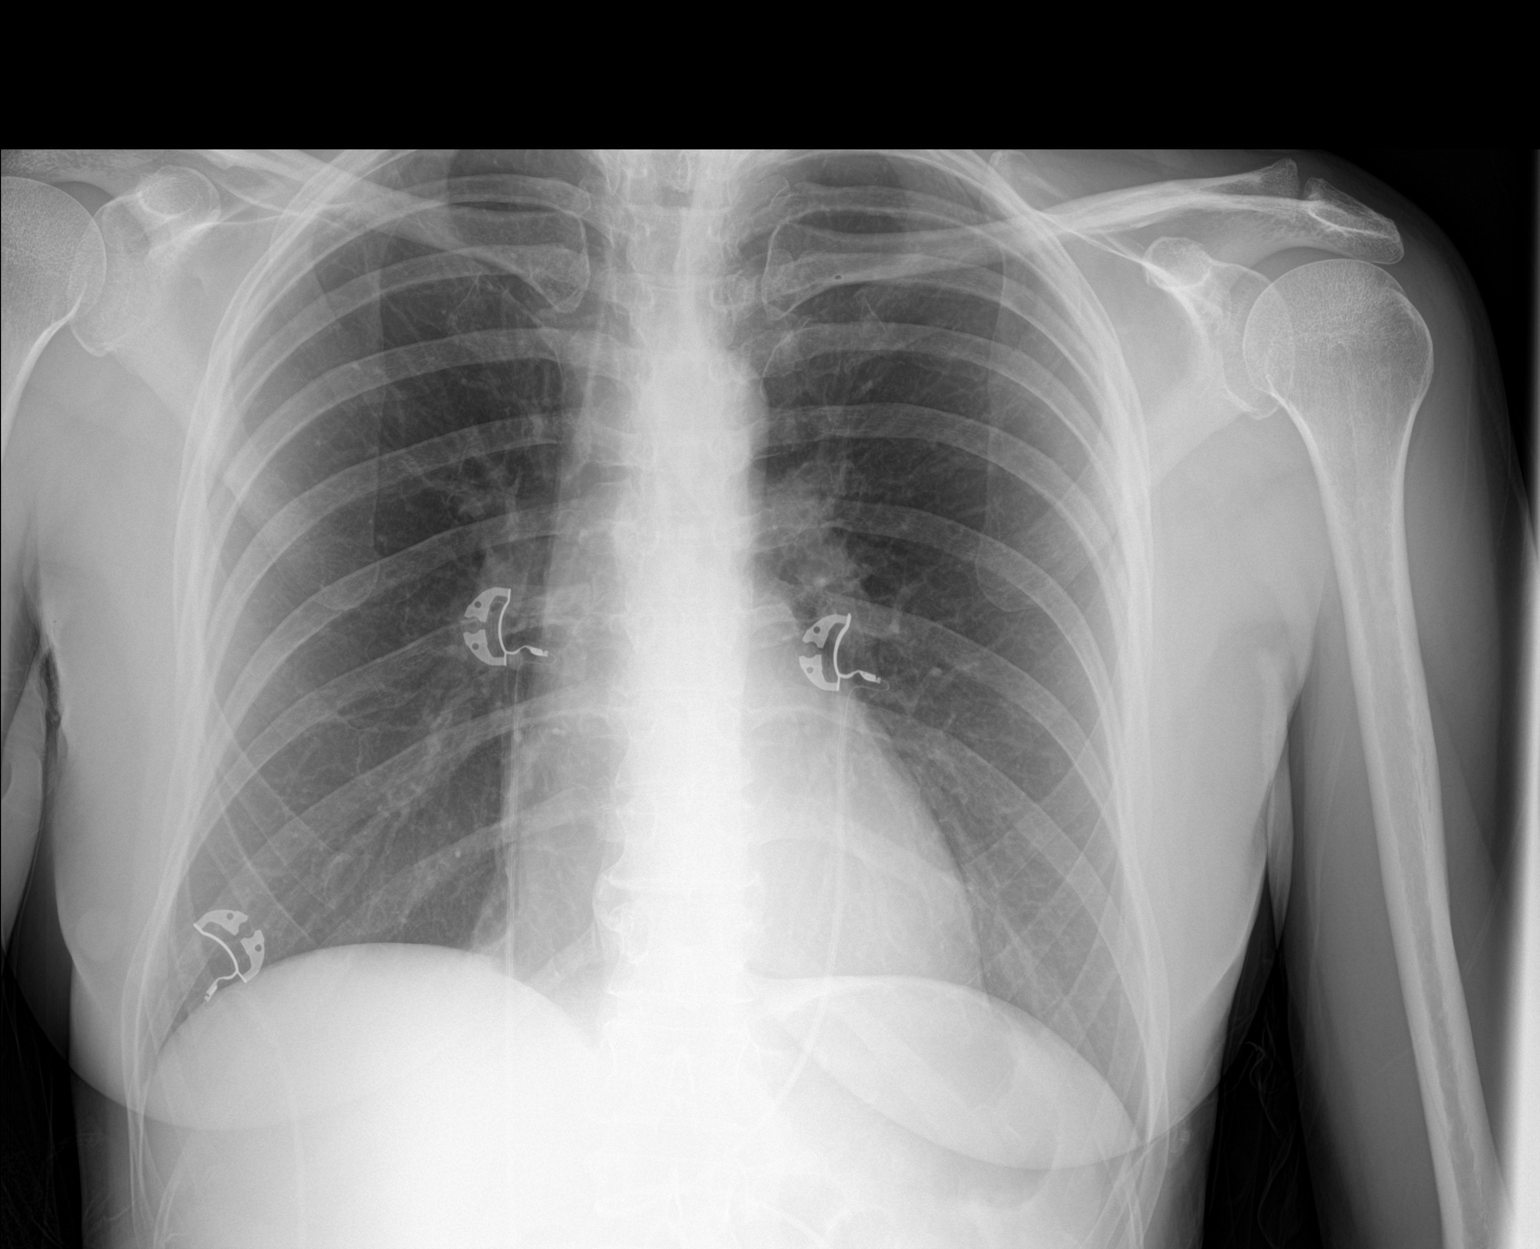

[1 of 1 positions shown; findings below may reference images not displayed]

FINDINGS: The heart size and mediastinal contours are within normal limits.
Both lungs are clear. There are metallic densities in the lower
cervical spine, possibly from previous cervical fusion.
IMPRESSION: No active disease.

## 2022-01-06 MED ORDER — METOCLOPRAMIDE HCL 5 MG/ML IJ SOLN: 5.0000 mg | Freq: Four times a day (QID) | INTRAMUSCULAR | Status: DC | PRN

## 2022-01-06 MED ORDER — IOHEXOL 300 MG/ML  SOLN
100.0000 mL | Freq: Once | INTRAMUSCULAR | Status: AC | PRN
  Administered 2022-01-06: 100 mL via INTRAVENOUS

## 2022-01-06 MED ORDER — ONDANSETRON HCL 4 MG/2ML IJ SOLN
4.0000 mg | Freq: Three times a day (TID) | INTRAMUSCULAR | Status: DC | PRN
  Administered 2022-01-07: 4 mg via INTRAVENOUS
  Filled 2022-01-06: qty 2

## 2022-01-06 MED ORDER — IOHEXOL 9 MG/ML PO SOLN
500.0000 mL | ORAL | Status: AC
  Administered 2022-01-06: 500 mL via ORAL

## 2022-01-06 MED ORDER — LACTATED RINGERS IV SOLN: INTRAVENOUS | Status: DC

## 2022-01-06 MED ORDER — ENOXAPARIN SODIUM 40 MG/0.4ML IJ SOSY
40.0000 mg | PREFILLED_SYRINGE | INTRAMUSCULAR | Status: DC
  Administered 2022-01-06: 40 mg via SUBCUTANEOUS
  Filled 2022-01-06: qty 0.4

## 2022-01-06 MED ORDER — PANTOPRAZOLE SODIUM 40 MG IV SOLR
40.0000 mg | Freq: Two times a day (BID) | INTRAVENOUS | Status: DC
Start: 2022-01-06 — End: 2022-01-07
  Administered 2022-01-06 – 2022-01-07 (×2): 40 mg via INTRAVENOUS
  Filled 2022-01-06 (×2): qty 10

## 2022-01-06 MED ORDER — IOHEXOL 9 MG/ML PO SOLN
ORAL | Status: AC
  Administered 2022-01-06: 500 mL via ORAL
  Filled 2022-01-06: qty 1000

## 2022-01-06 MED ORDER — MIRABEGRON ER 25 MG PO TB24
50.0000 mg | ORAL_TABLET | Freq: Every day | ORAL | Status: DC
  Administered 2022-01-06: 50 mg via ORAL
  Filled 2022-01-06: qty 2

## 2022-01-06 NOTE — Assessment & Plan Note (Signed)
a1c pending ?

## 2022-01-06 NOTE — Assessment & Plan Note (Addendum)
Taking euthyrox  ?

## 2022-01-06 NOTE — Assessment & Plan Note (Signed)
Pilocarpine ?Follow with rheum ?

## 2022-01-06 NOTE — Assessment & Plan Note (Signed)
Concern in ED for UTI, asymptomatic, UA not clearly suggestive of this, looks like contaminated with squams.  If concerned for sx UTI, would repeat UA/culture. ?

## 2022-01-06 NOTE — Progress Notes (Addendum)
?PROGRESS NOTE ? ? ? ?KYSHA MURALLES  FFM:[id number] DOB: July 29, 1961 DOA: 01/05/2022 ?PCP: Denita Lung, MD  ?Chief Complaint  ?Patient presents with  ? Emesis  ? ? ?Brief Narrative:  ?MACKINSEY PELLAND is Finnean Cerami 61 y.o. female with medical history significant of ovarian cancer, colon polyp, gastritis, hyperlipidemia, hypertension, Sjogren's disease, history of TIA, hypothyroidism who is coming to the emergency department with intractable nausea and vomiting.    ? ? ?Assessment & Plan: ?  ?Principal Problem: ?  Intractable nausea and vomiting ?Active Problems: ?  Prolonged QT interval ?  Hypomagnesemia ?  Sjogren's syndrome ([place]) ?  Glucose intolerance (impaired glucose tolerance) ?  ADD (attention deficit disorder) ?  Hypothyroidism ?  Essential hypertension ?  Hyperlipidemia, unspecified ?  Pyuria ? ? ?Assessment and Plan: ?* Intractable nausea and vomiting ?This is 3rd occurrence  ?Last admitted 02/05/2021 for this issue and had endoscopy notable for esophagitis.  Improved with bowel rest and PPI and supportive care. ?EKG shows improved QTc ?Will start zofran and reglan prn.  Daily EKG for QTc eval. ?CT shows moderate thickening of GE junction, suggestive of reflux or esophagitis. Mild diverticulosis without diverticulitis.  Possible tiny intraluminal gallstone without CT findings of cholecystitis. ?Will follow RUQ [place] ?Follow cortisol ?Continue clear liquid diet.  Elevate head of bed. ?Not clear to me what's causing her recurrent intractable N/V requiring hospitalization.  Cyclic vomiting? Gastroparesis? Etc. Consider discussion with GI if not improving.  Needs outpatient follow up regardless. ? ?Prolonged QT interval ?QTc improved today ?Follow daily EKG's with need for antiemetics/QT prolonging meds ? ?Hypomagnesemia ?normalized ? ?Sjogren's syndrome ([place]) ?Pilocarpine ?Follow with rheum ? ?Glucose intolerance (impaired glucose tolerance) ?a1c pending ? ?ADD (attention deficit disorder) ?Ritalin on  hold ?tenex ? ? ?Essential hypertension ?HCTZ on hold ? ?Hypothyroidism ?Taking euthyrox  ? ?Pyuria ?Concern in ED for UTI, asymptomatic, UA not clearly suggestive of this, looks like contaminated with squams.  If concerned for sx UTI, would repeat UA/culture. ? ? ?DVT prophylaxis: lovenox ?Code Status: full ?Family Communication: none ?Disposition:  ? ?Status is: Observation ?The patient will require care spanning > 2 midnights and should be moved to inpatient because: intractable N/V ?  ?Consultants:  ?none ? ?Procedures:  ?none ? ?Antimicrobials:  ?Anti-infectives (From admission, onward)  ? ? Start     Dose/Rate Route Frequency Ordered Stop  ? 01/05/22 0530  cefTRIAXone (ROCEPHIN) 1 g in sodium chloride 0.9 % 100 mL IVPB  Status:  Discontinued       ? 1 g ?200 mL/hr over 30 Minutes Intravenous Every 24 hours 01/05/22 0526 01/05/22 0904  ? ?  ? ? ?Subjective: ?Can't keep anything down, continued n/v ? ?Objective: ?Vitals:  ? 01/05/22 0912 01/05/22 1337 01/05/22 2034 01/06/22 0533  ?BP: (!) 149/76 (!) 151/77 (!) 160/72 139/71  ?Pulse: 67 88 92 79  ?Resp: '18 18 17 16  '$ ?Temp: 97.8 ?F (36.6 ?C) 98 ?F (36.7 ?C) 98.4 ?F (36.9 ?C) 98.6 ?F (37 ?C)  ?TempSrc: Oral Oral Oral Oral  ?SpO2: 100% 100% 100% 99%  ?Weight:      ?Height:      ? ? ?Intake/Output Summary (Last 24 hours) at 01/06/2022 1705 ?Last data filed at 01/06/2022 0400 ?Gross per 24 hour  ?Intake 233.36 ml  ?Output --  ?Net 233.36 ml  ? ?Filed Weights  ? 01/05/22 0100  ?Weight: 56.7 kg  ? ? ?Examination: ? ?General exam: Appears calm and comfortable  ?Respiratory system: unlabored ?Cardiovascular system: RRR ?  Gastrointestinal system: Abdomen is nondistended, soft and nontender.  ?Central nervous system: Alert and oriented. No focal neurological deficits. ?Extremities: no LEE ?Psychiatry: Judgement and insight appear normal. Mood & affect appropriate.  ? ? ? ?Data Reviewed: I have personally reviewed following labs and imaging studies ? ?CBC: ?Recent Labs  ?Lab  01/05/22 ?0120 01/06/22 ?0552  ?WBC 15.1* 10.5  ?HGB 13.6 12.2  ?HCT 39.2 36.7  ?MCV 87.3 91.5  ?PLT 325 243  ? ? ?Basic Metabolic Panel: ?Recent Labs  ?Lab 01/05/22 ?0120 01/06/22 ?0552  ?NA 136 138  ?K 3.5 4.2  ?CL 99 112*  ?CO2 22 22  ?GLUCOSE 183* 111*  ?BUN 14 12  ?CREATININE 0.95 0.63  ?CALCIUM 10.2 8.9  ?MG 1.5* 2.2  ?PHOS  --  2.8  ? ? ?GFR: ?Estimated Creatinine Clearance: 66.1 mL/min (by C-G formula based on SCr of 0.63 mg/dL). ? ?Liver Function Tests: ?Recent Labs  ?Lab 01/05/22 ?0120  ?AST 39  ?ALT 35  ?ALKPHOS 86  ?BILITOT 0.8  ?PROT 7.3  ?ALBUMIN 4.6  ? ? ?CBG: ?No results for input(s): GLUCAP in the last 168 hours. ? ? ?No results found for this or any previous visit (from the past 240 hour(s)).  ? ? ? ? ? ?Radiology Studies: ?CT Head Wo Contrast ? ?Result Date: 01/05/2022 ?CLINICAL DATA:  Delirium. EXAM: CT HEAD WITHOUT CONTRAST TECHNIQUE: Contiguous axial images were obtained from the base of the skull through the vertex without intravenous contrast. RADIATION DOSE REDUCTION: This exam was performed according to the departmental dose-optimization program which includes automated exposure control, adjustment of the mA and/or kV according to patient size and/or use of iterative reconstruction technique. COMPARISON:  04/06/2021 FINDINGS: Brain: There is no evidence for acute hemorrhage, hydrocephalus, mass lesion, or abnormal extra-axial fluid collection. No definite CT evidence for acute infarction. Vascular: No hyperdense vessel or unexpected calcification. Skull: No evidence for fracture. No worrisome lytic or sclerotic lesion. Sinuses/Orbits: The visualized paranasal sinuses and mastoid air cells are clear. Visualized portions of the globes and intraorbital fat are unremarkable. Other: None. IMPRESSION: No acute intracranial abnormality. Electronically Signed   By: Misty Stanley M.D.   On: 01/05/2022 05:45  ? ?CT ABDOMEN PELVIS W CONTRAST ? ?Result Date: 01/06/2022 ?CLINICAL DATA:  Nausea and  vomiting. EXAM: CT ABDOMEN AND PELVIS WITH CONTRAST TECHNIQUE: Multidetector CT imaging of the abdomen and pelvis was performed using the standard protocol following bolus administration of intravenous contrast. RADIATION DOSE REDUCTION: This exam was performed according to the departmental dose-optimization program which includes automated exposure control, adjustment of the mA and/or kV according to patient size and/or use of iterative reconstruction technique. CONTRAST:  166m OMNIPAQUE IOHEXOL 300 MG/ML  SOLN COMPARISON:  Noncontrast CT 02/02/2021 FINDINGS: Lower chest: No acute airspace disease or pleural effusion. There is moderate wall thickening of the gastroesophageal junction. Hepatobiliary: Minimal focal fatty infiltration adjacent to the falciform ligament. No focal liver lesion. Possible tiny intraluminal gallstone, series 2, image 23. No gallbladder inflammation. No biliary dilatation. Pancreas: No ductal dilatation or inflammation. No evidence of pancreatic mass. Spleen: Normal in size without focal abnormality. Adrenals/Urinary Tract: Normal adrenal glands. No hydronephrosis or perinephric edema. Homogeneous renal enhancement with symmetric excretion on delayed phase imaging. No visualized renal calculus. Urinary bladder is physiologically distended without wall thickening. Stomach/Bowel: Wall thickening at the gastroesophageal junction. The stomach is otherwise unremarkable. There is no small bowel obstruction or inflammatory change. Diminutive normal appendix tentatively visualized, series 5, image 47. Regardless, no appendicitis. Small volume  of colonic stool. Occasional areas of colonic peristalsis. Mild descending and sigmoid colonic diverticulosis without focal diverticulitis. There is no colonic inflammation or pericolonic edema. Vascular/Lymphatic: Minimal aortic atherosclerosis. No aortic aneurysm. Patent portal, splenic, and mesenteric veins. No abdominopelvic adenopathy. Reproductive:  Status post hysterectomy. No adnexal masses. Other: No free air, free fluid, or intra-abdominal fluid collection. Tiny fat containing umbilical hernia. Musculoskeletal: Degenerative disc disease at L5-S1. There are no acute or sus

## 2022-01-06 NOTE — Assessment & Plan Note (Signed)
zoloft

## 2022-01-06 NOTE — Assessment & Plan Note (Addendum)
Ritalin on hold ?tenex ? ?

## 2022-01-06 NOTE — Assessment & Plan Note (Addendum)
This is 3rd occurrence  ?Last admitted 02/05/2021 for this issue and had endoscopy notable for esophagitis.  Improved with bowel rest and PPI and supportive care. ?EKG shows improved QTc ?Will start zofran and reglan prn.  Daily EKG for QTc eval. ?CT shows moderate thickening of GE junction, suggestive of reflux or esophagitis. Mild diverticulosis without diverticulitis.  Possible tiny intraluminal gallstone without CT findings of cholecystitis. ?Will follow RUQ [place] ?Follow cortisol ?Continue clear liquid diet.  Elevate head of bed. ?Not clear to me what's causing her recurrent intractable N/V requiring hospitalization.  Cyclic vomiting? Gastroparesis? Etc. Consider discussion with GI if not improving.  Needs outpatient follow up regardless. ?

## 2022-01-06 NOTE — Assessment & Plan Note (Signed)
HCTZ on hold ?

## 2022-01-06 NOTE — Assessment & Plan Note (Signed)
normalized 

## 2022-01-06 NOTE — Hospital Course (Signed)
Dorothy Wood is [name] 61 y.o. female with medical history significant of ovarian cancer, colon polyp, gastritis, hyperlipidemia, hypertension, Sjogren's disease, history of TIA, hypothyroidism who is coming to the emergency department with intractable nausea and vomiting.   ?

## 2022-01-06 NOTE — Assessment & Plan Note (Signed)
QTc improved today ?Follow daily EKG's with need for antiemetics/QT prolonging meds ?

## 2022-01-07 ENCOUNTER — Other Ambulatory Visit (HOSPITAL_COMMUNITY): Payer: Self-pay

## 2022-01-07 ENCOUNTER — Other Ambulatory Visit: Payer: Self-pay

## 2022-01-07 DIAGNOSIS — K219 Gastro-esophageal reflux disease without esophagitis: Secondary | ICD-10-CM

## 2022-01-07 DIAGNOSIS — K8020 Calculus of gallbladder without cholecystitis without obstruction: Secondary | ICD-10-CM

## 2022-01-07 DIAGNOSIS — I10 Essential (primary) hypertension: Secondary | ICD-10-CM

## 2022-01-07 MED ORDER — PROCHLORPERAZINE MALEATE 5 MG PO TABS
5.0000 mg | ORAL_TABLET | Freq: Four times a day (QID) | ORAL | 0 refills | Status: DC | PRN
  Filled 2022-01-07: qty 30, 8d supply, fill #0

## 2022-01-07 MED ORDER — OMEPRAZOLE 40 MG PO CPDR
40.0000 mg | DELAYED_RELEASE_CAPSULE | Freq: Two times a day (BID) | ORAL | 0 refills | Status: DC
  Filled 2022-01-07: qty 60, 30d supply, fill #0

## 2022-01-07 NOTE — Plan of Care (Signed)
Patient ID: Dorothy Wood, female   DOB: Aug 13, 1961, 61 y.o.   MRN: [id number] ? ?Problem: Education: ?Goal: Knowledge of General Education information will improve ?Description: Including pain rating scale, medication(s)/side effects and non-pharmacologic comfort measures ?Outcome: Adequate for Discharge ?  ?Problem: Health Behavior/Discharge Planning: ?Goal: Ability to manage health-related needs will improve ?Outcome: Adequate for Discharge ?  ?Problem: Clinical Measurements: ?Goal: Ability to maintain clinical measurements within normal limits will improve ?Outcome: Adequate for Discharge ?Goal: Will remain free from infection ?Outcome: Adequate for Discharge ?Goal: Diagnostic test results will improve ?Outcome: Adequate for Discharge ?Goal: Respiratory complications will improve ?Outcome: Adequate for Discharge ?Goal: Cardiovascular complication will be avoided ?Outcome: Adequate for Discharge ?  ?Problem: Activity: ?Goal: Risk for activity intolerance will decrease ?Outcome: Adequate for Discharge ?  ?Problem: Nutrition: ?Goal: Adequate nutrition will be maintained ?Outcome: Adequate for Discharge ?  ?Problem: Coping: ?Goal: Level of anxiety will decrease ?Outcome: Adequate for Discharge ?  ?Problem: Elimination: ?Goal: Will not experience complications related to bowel motility ?Outcome: Adequate for Discharge ?Goal: Will not experience complications related to urinary retention ?Outcome: Adequate for Discharge ?  ?Problem: Pain Managment: ?Goal: General experience of comfort will improve ?Outcome: Adequate for Discharge ?  ?Problem: Safety: ?Goal: Ability to remain free from injury will improve ?Outcome: Adequate for Discharge ?  ?Problem: Skin Integrity: ?Goal: Risk for impaired skin integrity will decrease ?Outcome: Adequate for Discharge ?  ? ?Haydee Salter, RN ? ?

## 2022-01-07 NOTE — Consult Note (Signed)
? ?  [facility] CM Inpatient Consult ? ? ?01/07/2022 ? ?SIGNA CHEEK ?1961/07/27 ?[id number] ? ? [facility] [employer] [ACO] Patient: [facility] Employee Plan ? ?Primary Care Provider:  Denita Lung, MD, [facility] ? ?Reviewed  for post hospital follow up for support and assess for disease management as part of the health plan.  ?   ? Plan: Patient has transitioned home today. Patient will be followed by [place] Coordinator. ?  ?For additional questions or referrals please contact: ?  ?Natividad Brood, RN BSN CCM ?[facility] Liaison ? [phone] business mobile phone ?Toll free office [phone]  ?Fax number: [phone] ?[email or web address] ?  ? ?

## 2022-01-07 NOTE — Discharge Summary (Signed)
PatientPhysician Discharge Summary  ?Dorothy Wood ZWC:[id number] DOB: Feb 23, 1961 DOA: 01/05/2022 ? ?PCP: Denita Lung, MD ? ?Admit date: 01/05/2022 ?Discharge date: 01/07/2022 ?30 Day Unplanned Readmission Risk Score   ? ?Flowsheet Row ED to Hosp-Admission (Current) from 01/05/2022 in [place]  ?30 Day Unplanned Readmission Risk Score (%) 14.54 Filed at 01/07/2022 0801  ? ?  ? ? This score is the patient's risk of an unplanned readmission within 30 days of being discharged (0 -100%). The score is based on dignosis, age, lab data, medications, orders, and past utilization.   ?Low:  0-14.9   Medium: 15-21.9   High: 22-29.9   Extreme: 30 and above ? ?  ? ?  ? ? ? ?Admitted From: Home ?Disposition: Home ? ?Recommendations for Outpatient Follow-up:  ?Follow up with PCP in 1-2 weeks ?Please obtain BMP/CBC in one week ?Follow-up with GI in 4 weeks for GERD. ?Follow-up with general surgery in 2 weeks to schedule elective cholecystectomy. ?Please follow up with your PCP on the following pending results: ?Unresulted Labs (From admission, onward)  ? ? None  ? ?  ?  ? ? ?Home Health: None ?Equipment/Devices: None ? ?Discharge Condition: Stable ?CODE STATUS: Full code ?Diet recommendation: Cardiac ? ?Subjective: Seen and examined.  Daughter at the bedside.  Patient feels better.  Received Zofran this morning with no nausea.  Tolerating liquids.  She is comfortable going home. ? ?Brief/Interim Summary: Dorothy Wood is a 61 y.o. female with medical history significant of ovarian cancer, colon polyp, gastritis, hyperlipidemia, hypertension, Sjogren's disease, history of TIA, hypothyroidism who presented to the emergency department with intractable nausea and vomiting.   Details as below. ? ?* Intractable nausea and vomiting ?This is 3rd occurrence  ?Last admitted 02/05/2021 for this issue and had endoscopy notable for esophagitis.  Improved with bowel rest and PPI and supportive care. ?CT shows moderate  thickening of GE junction, suggestive of reflux or esophagitis. Mild diverticulosis without diverticulitis.  Possible tiny intraluminal gallstone without CT findings of cholecystitis.  She is tolerating full liquid diet and her symptoms have now resolved with symptomatic treatment.  Her symptoms are likely secondary to severe GERD with some combination contributing from possible cholelithiasis.  Patient also understands that.  She took 1 month supply of omeprazole 40 mg p.o. daily about a year ago but she takes intermittently omeprazole.  She has been advised thoroughly on lifestyle modification to avoid her GERD, she drinks 3 cups of coffee every day.  She has been advised to reduce that amount as well.  I am discharging her on omeprazole 40 mg p.o. daily for 1 month and I have advised her to continue that once daily for 2 weeks after 4 weeks and completed.  Follow-up with GI for that. ?  ?Prolonged QT interval ?QTc improved today.  Avoid QTc prolonging medications.  Discharging her on Compazine for symptomatic treatment. ?  ?Hypomagnesemia ?normalized ?  ?Sjogren's syndrome ([place]) ?Pilocarpine ?Follow with rheum ? ?Essential hypertension ?Resume home medications. ?  ?Hypothyroidism ?Taking euthyrox  ?  ?Pyuria ?Concern in ED for UTI, asymptomatic, UA not clearly suggestive of this, looks like contaminated with squams.  ? ?Cholelithiasis without cholecystitis: Patient has been advised to follow-up with general surgery for elective cholecystectomy. ? ?Discharge plan was discussed with patient and/or family member and they verbalized understanding and agreed with it.  ?Discharge Diagnoses:  ?Principal Problem: ?  Intractable nausea and vomiting ?Active Problems: ?  Prolonged QT interval ?  Hypomagnesemia ?  Sjogren's syndrome ([place]) ?  Glucose intolerance (impaired glucose tolerance) ?  ADD (attention deficit disorder) ?  Hypothyroidism ?  Essential hypertension ?  Hyperlipidemia, unspecified ?  Pyuria ?  GERD  (gastroesophageal reflux disease) ?  Cholelithiasis without cholecystitis ? ? ? ?Discharge Instructions ? ? ?Allergies as of 01/07/2022   ?No Known Allergies ?  ? ?  ?Medication List  ?  ? ?TAKE these medications   ? ?aspirin EC 81 MG tablet ?Take 81 mg by mouth every morning. Swallow whole. ?  ?atorvastatin 80 MG tablet ?Commonly known as: LIPITOR ?Take 1 tablet (80 mg total) by mouth daily. ?  ?buPROPion 300 MG 24 hr tablet ?Commonly known as: Wellbutrin XL ?Take 1 tablet (300 mg total) by mouth every morning. ?  ?guanFACINE 2 MG tablet ?Commonly known as: TENEX ?Take 1 tablet by mouth every morning and 1 tablet at bedtime ?What changed:  ?how much to take ?how to take this ?when to take this ?Another medication with the same name was removed. Continue taking this medication, and follow the directions you see here. ?  ?hydrochlorothiazide 25 MG tablet ?Commonly known as: HYDRODIURIL ?TAKE 1 TABLET BY MOUTH ONCE DAILY ?What changed: how much to take ?  ?levothyroxine 88 MCG tablet ?Commonly known as: Euthyrox ?TAKE 1 TABLET BY MOUTH ONCE A DAY ?What changed:  ?when to take this ?Another medication with the same name was removed. Continue taking this medication, and follow the directions you see here. ?  ?LORazepam 1 MG tablet ?Commonly known as: Ativan ?Take 1 tablet by mouth daily as needed for anxiety (30 day supply) ?What changed:  ?how much to take ?how to take this ?when to take this ?reasons to take this ?Another medication with the same name was removed. Continue taking this medication, and follow the directions you see here. ?  ?methylphenidate 20 MG tablet ?Commonly known as: Ritalin ?Take 1 tablet by mouth 3 times daily ?What changed:  ?how much to take ?how to take this ?when to take this ?additional instructions ?Another medication with the same name was removed. Continue taking this medication, and follow the directions you see here. ?  ?Myrbetriq 50 MG Tb24 tablet ?Generic drug: mirabegron ER ?Take 1  tablet by mouth daily ?What changed:  ?how much to take ?when to take this ?  ?omeprazole 40 MG capsule ?Commonly known as: PRILOSEC ?Take 1 capsule (40 mg total) by mouth 2 (two) times daily. ?  ?pilocarpine 5 MG tablet ?Commonly known as: SALAGEN ?TAKE 1 TABLET BY MOUTH 4 TIMES DAILY ?What changed:  ?how much to take ?when to take this ?  ?polyethylene glycol 17 g packet ?Commonly known as: MIRALAX / GLYCOLAX ?Take 8.5 g by mouth See admin instructions. Mix 8.5 grams of powder into the recommended amount of liquid and drink every morning ?  ?prochlorperazine 5 MG tablet ?Commonly known as: COMPAZINE ?Take 1 tablet (5 mg total) by mouth every 6 (six) hours as needed for nausea or vomiting. ?  ?sertraline 50 MG tablet ?Commonly known as: Zoloft ?Take 1/2 tablet by mouth every morning for 1 week then take 1 tablet every morning ?What changed:  ?how much to take ?how to take this ?when to take this ?Another medication with the same name was removed. Continue taking this medication, and follow the directions you see here. ?  ? ?  ? ? Follow-up Information   ? ? Denita Lung, MD Follow up in 1 week(s).   ?  Specialty: Family Medicine ?Contact information: ?[address] ?[place] [address] ?[phone] ? ? ?  ?  ? ? [facility] Follow up in 2 week(s).   ?Specialty: General Surgery ?Contact information: ?[address] ?[address] ?[place] ?[phone] ? ?  ?  ? ?  ?  ? ?  ? ?No Known Allergies ? ?Consultations: None ? ? ?Procedures/Studies: ?CT Head Wo Contrast ? ?Result Date: 01/05/2022 ?CLINICAL DATA:  Delirium. EXAM: CT HEAD WITHOUT CONTRAST TECHNIQUE: Contiguous axial images were obtained from the base of the skull through the vertex without intravenous contrast. RADIATION DOSE REDUCTION: This exam was performed according to the departmental dose-optimization program which includes automated exposure control, adjustment of the mA and/or kV according to  patient size and/or use of iterative reconstruction technique. COMPARISON:  04/06/2021 FINDINGS: Brain: There is no evidence for acute hemorrhage, hydrocephalus, mass lesion, or abnormal extra-axial fluid collection.

## 2022-01-08 ENCOUNTER — Telehealth: Payer: Self-pay | Admitting: *Deleted

## 2022-01-08 ENCOUNTER — Telehealth: Payer: Self-pay

## 2022-01-08 NOTE — Chronic Care Management (AMB) (Signed)
?  Care Management  ? ?Outreach Note ? ?01/08/2022 ?Name: KAELEA GATHRIGHT MRN: [id number] DOB: 09/10/1961 ? ?Referred by: Denita Lung, MD ?Reason for referral : Care Coordination (Outreach to schedule with BSW ) ? ? ?An unsuccessful telephone outreach was attempted today. The patient was referred to the case management team for assistance with care management and care coordination.  ? ?Follow Up Plan:  ?A HIPAA compliant phone message was left for the patient providing contact information and requesting a return call.  ?The care management team will reach out to the patient again over the next 1 days.  ?If patient returns call to provider office, please advise to call [place]* at [phone].* ? ?Laverda Sorenson  ?Care Guide, Embedded Care Coordination ?[facility]  Care Management  ?Direct Dial: [phone] ? ?

## 2022-01-08 NOTE — Telephone Encounter (Signed)
Transition Care Management Follow-up Telephone Call ?Date of discharge and from where: 01/07/2022 [facility] ?How have you been since you were released from the hospital? Still weak ?Any questions or concerns? No ? ?Items Reviewed: ?Did the pt receive and understand the discharge instructions provided? Yes  ?Medications obtained and verified? Yes  ?Other? No  ?Any new allergies since your discharge? No  ?Dietary orders reviewed? Yes ?Do you have support at home? Yes  ? ?Home Care and Equipment/Supplies: ?Were home health services ordered? not applicable ?If so, what is the name of the agency? N/a  ?Has the agency set up a time to come to the patient's home? not applicable ?Were any new equipment or medical supplies ordered?  No ?What is the name of the medical supply agency? N/a ?Were you able to get the supplies/equipment? no ?Do you have any questions related to the use of the equipment or supplies? No ? ?Functional Questionnaire: (I = Independent and D = Dependent) ?ADLs: I ? ?Bathing/Dressing- I ? ?Meal Prep- I ? ?Eating- I ? ?Maintaining continence- I ? ?Transferring/Ambulation- I ? ?Managing Meds- I ? ?Follow up appointments reviewed: ? ?PCP Hospital f/u appt confirmed? Yes  Scheduled to see Dr. Redmond School on 01/22/2022 @ 11:15. ?Are transportation arrangements needed? No  ?If their condition worsens, is the pt aware to call PCP or go to the Emergency Dept.? Yes ?Was the patient provided with contact information for the PCP's office or ED? Yes ?Was to pt encouraged to call back with questions or concerns? Yes ? ?

## 2022-01-08 NOTE — Chronic Care Management (AMB) (Signed)
?  Care Management  ? ?Note ? ?01/08/2022 ?Name: Dorothy Wood MRN: [id number] DOB: [birth year] ? ?Dorothy Wood is a 61 y.o. year old female who is a primary care patient of Denita Lung, MD. I reached out to [place] [facility] by phone today offer care coordination services.  ? ?[name] was given information about care management services today including:  ?Care management services include personalized support from designated clinical staff supervised by her physician, including individualized plan of care and coordination with other care providers ?24/7 contact phone numbers for assistance for urgent and routine care needs. ?The patient may stop care management services at any time by phone call to the office staff. ? ?Patient agreed to services and verbal consent obtained.  ? ?Follow up plan: ?Telephone appointment with care management team member scheduled for:01/09/22 ? ?Laverda Sorenson  ?Care Guide, Embedded Care Coordination ?[facility]  Care Management  ?Direct Dial: [phone] ? ?

## 2022-01-09 ENCOUNTER — Emergency Department (HOSPITAL_COMMUNITY): Payer: 59

## 2022-01-09 ENCOUNTER — Encounter (HOSPITAL_COMMUNITY): Payer: Self-pay

## 2022-01-09 ENCOUNTER — Other Ambulatory Visit: Payer: Self-pay

## 2022-01-09 ENCOUNTER — Inpatient Hospital Stay (HOSPITAL_COMMUNITY)
Admission: EM | Admit: 2022-01-09 | Discharge: 2022-01-13 | DRG: 392 | Disposition: A | Discharge status: Home / Self Care | Payer: 59 | Attending: Family Medicine | Admitting: Family Medicine

## 2022-01-09 ENCOUNTER — Telehealth: Payer: 59

## 2022-01-09 DIAGNOSIS — K219 Gastro-esophageal reflux disease without esophagitis: Secondary | ICD-10-CM | POA: Diagnosis not present

## 2022-01-09 DIAGNOSIS — R112 Nausea with vomiting, unspecified: Secondary | ICD-10-CM | POA: Diagnosis not present

## 2022-01-09 DIAGNOSIS — Z90710 Acquired absence of both cervix and uterus: Secondary | ICD-10-CM

## 2022-01-09 DIAGNOSIS — R0600 Dyspnea, unspecified: Secondary | ICD-10-CM | POA: Diagnosis not present

## 2022-01-09 DIAGNOSIS — R300 Dysuria: Secondary | ICD-10-CM | POA: Diagnosis present

## 2022-01-09 DIAGNOSIS — E871 Hypo-osmolality and hyponatremia: Secondary | ICD-10-CM | POA: Diagnosis not present

## 2022-01-09 DIAGNOSIS — Z8543 Personal history of malignant neoplasm of ovary: Secondary | ICD-10-CM | POA: Diagnosis not present

## 2022-01-09 DIAGNOSIS — M3500 Sicca syndrome, unspecified: Secondary | ICD-10-CM | POA: Diagnosis present

## 2022-01-09 DIAGNOSIS — Z8249 Family history of ischemic heart disease and other diseases of the circulatory system: Secondary | ICD-10-CM

## 2022-01-09 DIAGNOSIS — K449 Diaphragmatic hernia without obstruction or gangrene: Secondary | ICD-10-CM | POA: Diagnosis present

## 2022-01-09 DIAGNOSIS — N3000 Acute cystitis without hematuria: Secondary | ICD-10-CM | POA: Diagnosis not present

## 2022-01-09 DIAGNOSIS — E785 Hyperlipidemia, unspecified: Secondary | ICD-10-CM | POA: Diagnosis present

## 2022-01-09 DIAGNOSIS — Z79899 Other long term (current) drug therapy: Secondary | ICD-10-CM | POA: Diagnosis not present

## 2022-01-09 DIAGNOSIS — Z79890 Hormone replacement therapy (postmenopausal): Secondary | ICD-10-CM | POA: Diagnosis not present

## 2022-01-09 DIAGNOSIS — E039 Hypothyroidism, unspecified: Secondary | ICD-10-CM | POA: Diagnosis present

## 2022-01-09 DIAGNOSIS — Z888 Allergy status to other drugs, medicaments and biological substances status: Secondary | ICD-10-CM

## 2022-01-09 DIAGNOSIS — I10 Essential (primary) hypertension: Secondary | ICD-10-CM | POA: Diagnosis present

## 2022-01-09 DIAGNOSIS — F410 Panic disorder [episodic paroxysmal anxiety] without agoraphobia: Secondary | ICD-10-CM | POA: Diagnosis present

## 2022-01-09 DIAGNOSIS — R9431 Abnormal electrocardiogram [ECG] [EKG]: Secondary | ICD-10-CM | POA: Diagnosis present

## 2022-01-09 DIAGNOSIS — Z818 Family history of other mental and behavioral disorders: Secondary | ICD-10-CM

## 2022-01-09 DIAGNOSIS — C569 Malignant neoplasm of unspecified ovary: Secondary | ICD-10-CM | POA: Diagnosis not present

## 2022-01-09 DIAGNOSIS — Z8673 Personal history of transient ischemic attack (TIA), and cerebral infarction without residual deficits: Secondary | ICD-10-CM

## 2022-01-09 DIAGNOSIS — Z7982 Long term (current) use of aspirin: Secondary | ICD-10-CM | POA: Diagnosis not present

## 2022-01-09 DIAGNOSIS — F341 Dysthymic disorder: Secondary | ICD-10-CM | POA: Diagnosis present

## 2022-01-09 DIAGNOSIS — R1011 Right upper quadrant pain: Secondary | ICD-10-CM | POA: Diagnosis not present

## 2022-01-09 DIAGNOSIS — E876 Hypokalemia: Secondary | ICD-10-CM | POA: Diagnosis present

## 2022-01-09 LAB — URINALYSIS, ROUTINE W REFLEX MICROSCOPIC
Bilirubin Urine: NEGATIVE
Glucose, UA: NEGATIVE mg/dL
Hgb urine dipstick: NEGATIVE
Ketones, ur: 5 mg/dL — AB
Nitrite: NEGATIVE
Protein, ur: NEGATIVE mg/dL
Specific Gravity, Urine: 1.015 (ref 1.005–1.030)
pH: 9 — ABNORMAL HIGH (ref 5.0–8.0)

## 2022-01-09 LAB — CBC WITH DIFFERENTIAL/PLATELET
Abs Immature Granulocytes: 0.06 10*3/uL (ref 0.00–0.07)
Basophils Absolute: 0 10*3/uL (ref 0.0–0.1)
Basophils Relative: 0 %
Eosinophils Absolute: 0 10*3/uL (ref 0.0–0.5)
Eosinophils Relative: 0 %
HCT: 40.2 % (ref 36.0–46.0)
Hemoglobin: 14 g/dL (ref 12.0–15.0)
Immature Granulocytes: 1 %
Lymphocytes Relative: 16 %
Lymphs Abs: 1.5 10*3/uL (ref 0.7–4.0)
MCH: 30.6 pg (ref 26.0–34.0)
MCHC: 34.8 g/dL (ref 30.0–36.0)
MCV: 87.8 fL (ref 80.0–100.0)
Monocytes Absolute: 0.9 10*3/uL (ref 0.1–1.0)
Monocytes Relative: 10 %
Neutro Abs: 6.9 10*3/uL (ref 1.7–7.7)
Neutrophils Relative %: 73 %
Platelets: 283 10*3/uL (ref 150–400)
RBC: 4.58 MIL/uL (ref 3.87–5.11)
RDW: 12.3 % (ref 11.5–15.5)
WBC: 9.4 10*3/uL (ref 4.0–10.5)
nRBC: 0 % (ref 0.0–0.2)

## 2022-01-09 LAB — COMPREHENSIVE METABOLIC PANEL
ALT: 40 U/L (ref 0–44)
AST: 36 U/L (ref 15–41)
Albumin: 4.3 g/dL (ref 3.5–5.0)
Alkaline Phosphatase: 78 U/L (ref 38–126)
Anion gap: 14 (ref 5–15)
BUN: 14 mg/dL (ref 8–23)
CO2: 19 mmol/L — ABNORMAL LOW (ref 22–32)
Calcium: 9.4 mg/dL (ref 8.9–10.3)
Chloride: 106 mmol/L (ref 98–111)
Creatinine, Ser: 0.81 mg/dL (ref 0.44–1.00)
GFR, Estimated: >60 mL/min (ref >60)
Glucose, Bld: 107 mg/dL — ABNORMAL HIGH (ref 70–99)
Potassium: 3.4 mmol/L — ABNORMAL LOW (ref 3.5–5.1)
Sodium: 139 mmol/L (ref 135–145)
Total Bilirubin: 0.9 mg/dL (ref 0.3–1.2)
Total Protein: 7.1 g/dL (ref 6.5–8.1)

## 2022-01-09 LAB — MAGNESIUM: Magnesium: 1.8 mg/dL (ref 1.7–2.4)

## 2022-01-09 LAB — LIPASE, BLOOD: Lipase: 37 U/L (ref 11–51)

## 2022-01-09 IMAGING — US US ABDOMEN LIMITED
1 series · 15 of 25 positions shown · non-contrast
Comparison: [DATE]

CLINICAL DATA: Right upper quadrant pain gallbladder sludge.

EXAM:
ULTRASOUND ABDOMEN LIMITED RIGHT UPPER QUADRANT

[Series 1: us abdomen limited ruq mc & wl · 15 of 52 slices shown]
[im 1/52]
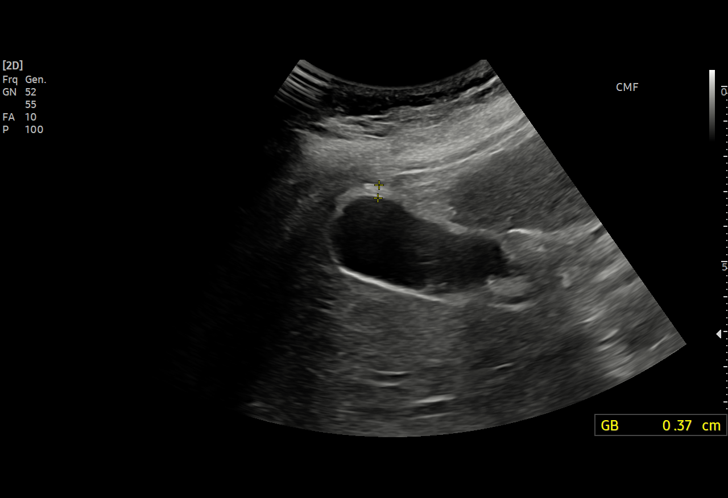
[im 5/52]
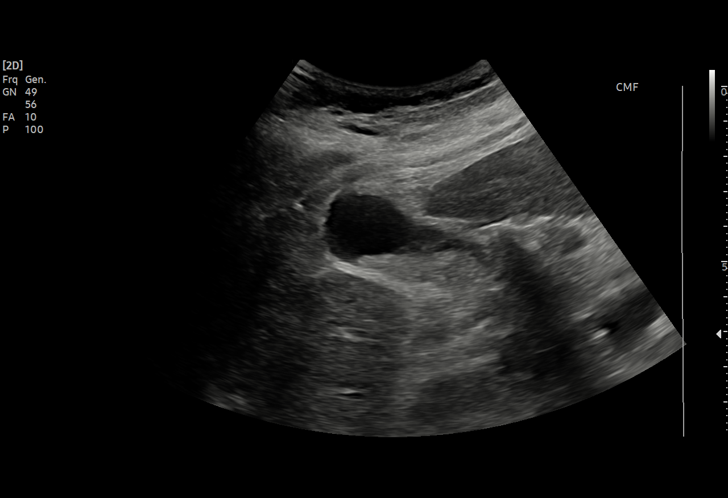
[im 9/52]
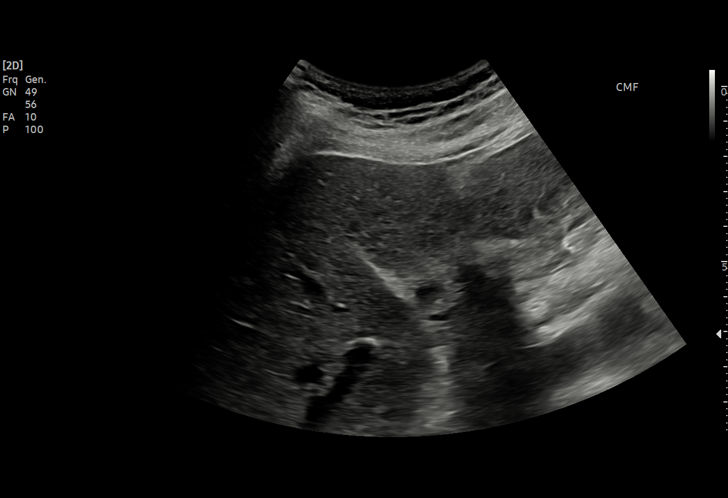
[im 11/52]
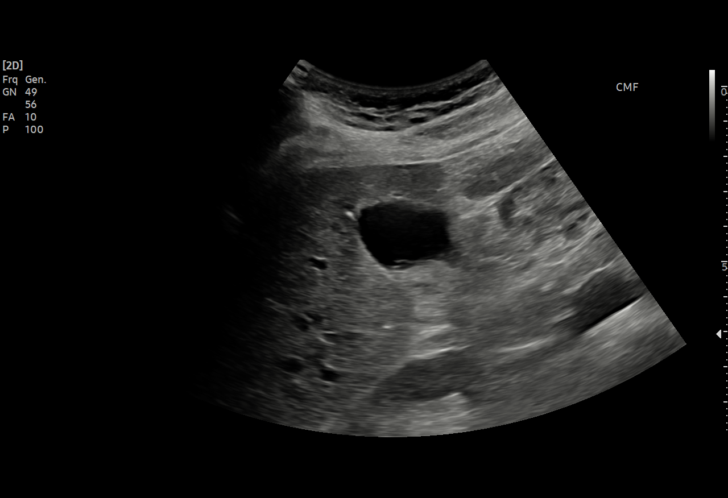
[im 15/52]
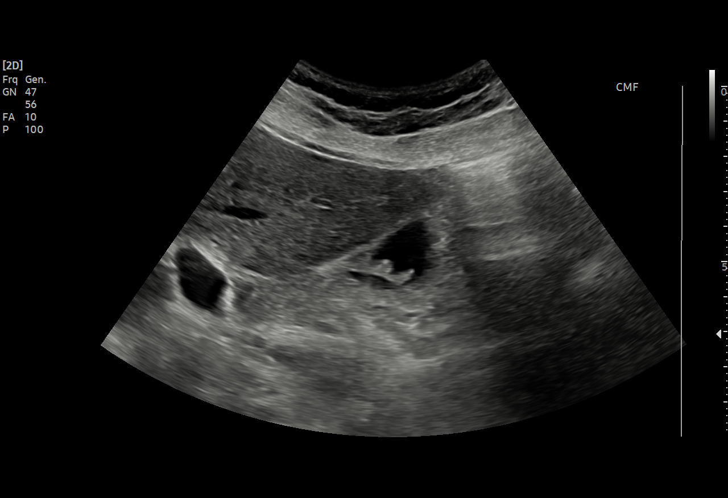
[im 20/52]
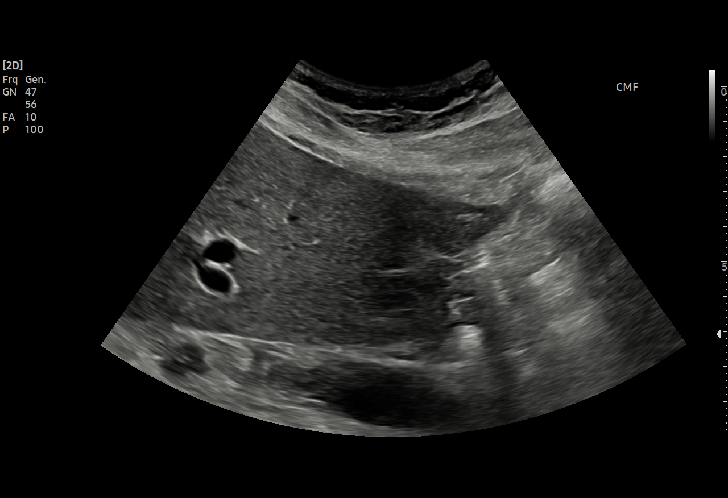
[im 22/52]
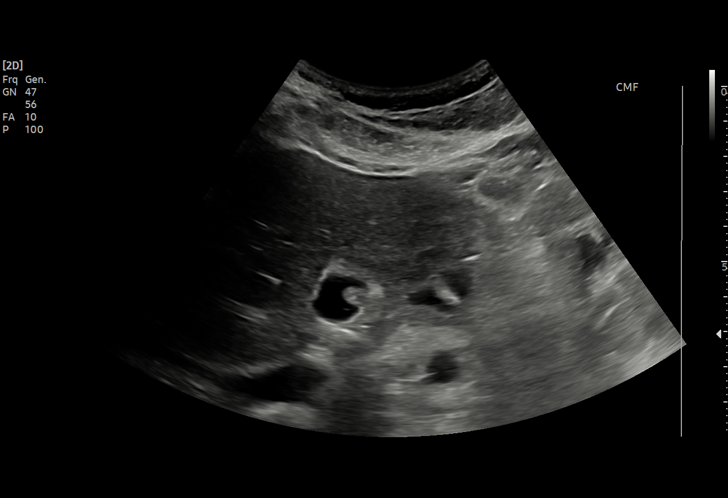
[im 26/52]
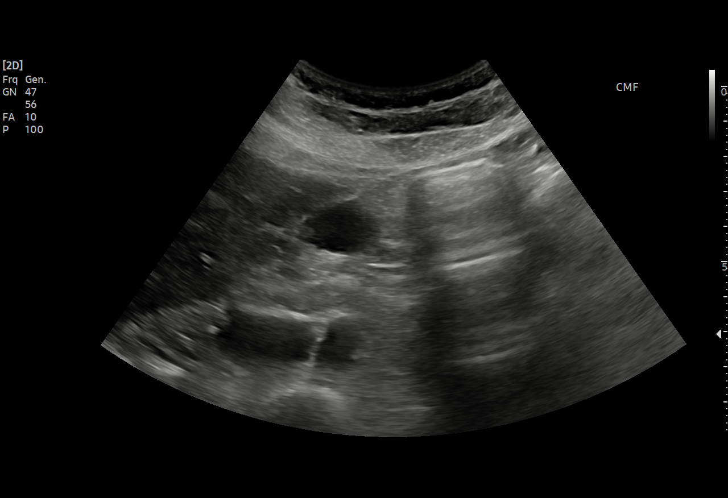
[im 30/52]
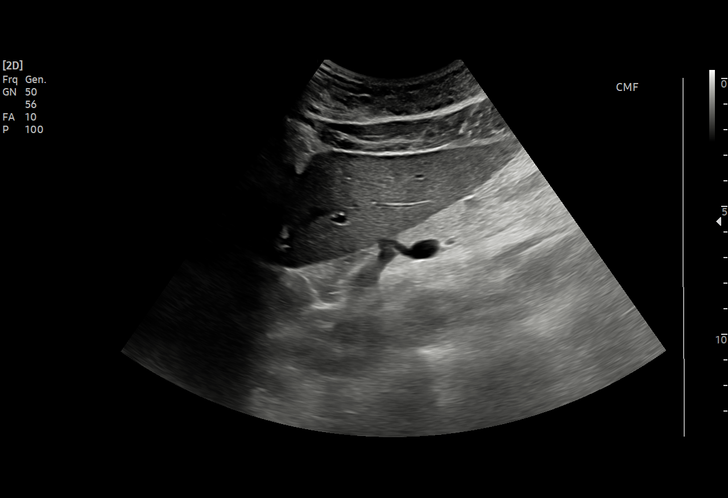
[im 32/52]
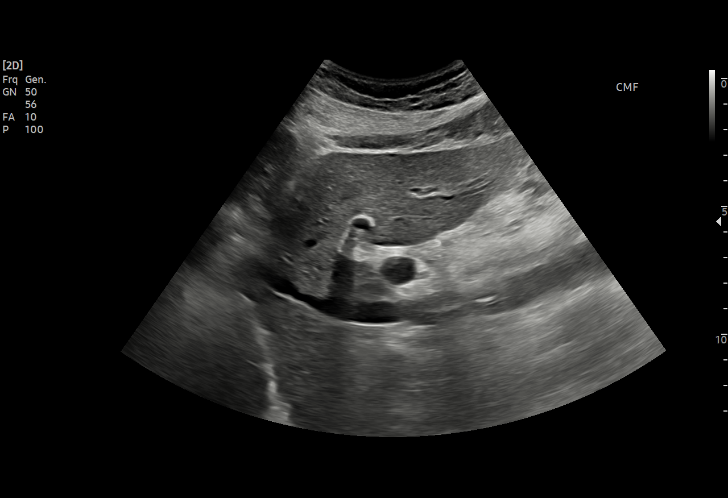
[im 37/52]
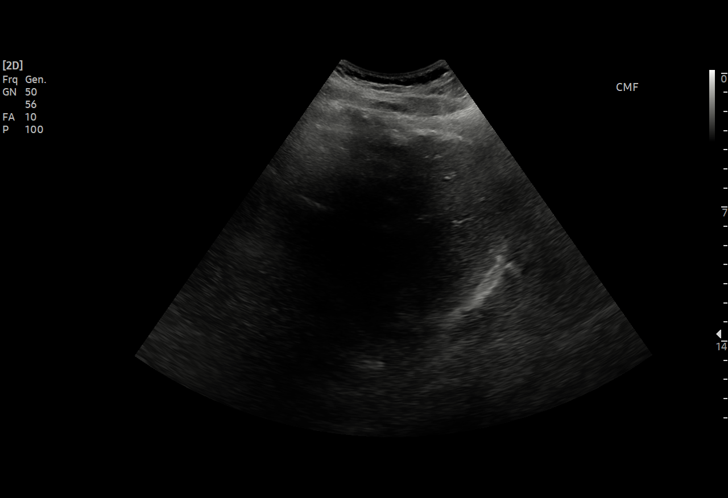
[im 41/52]
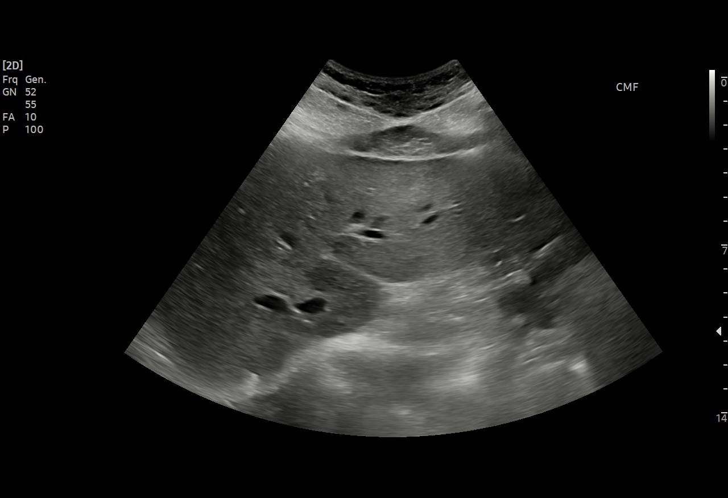
[im 43/52]
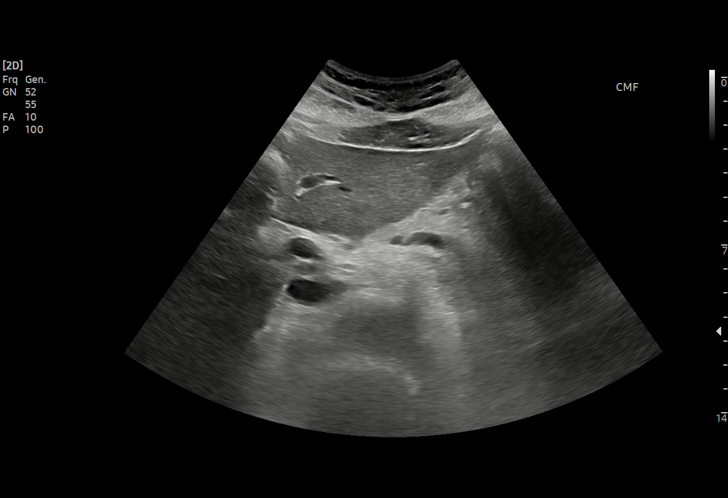
[im 47/52]
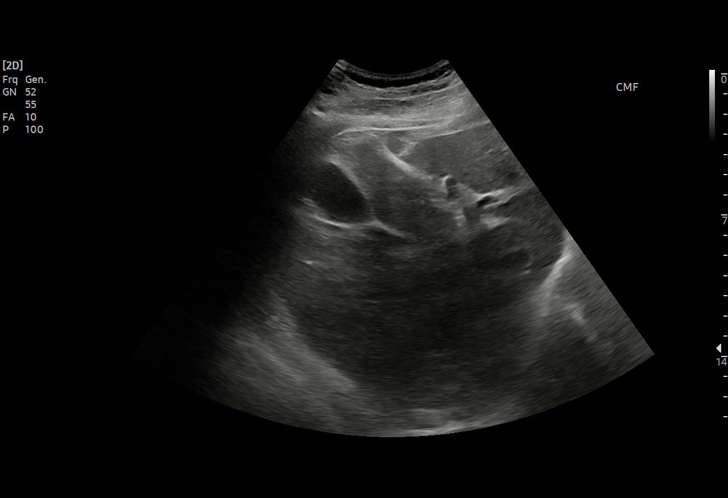
[im 52/52]
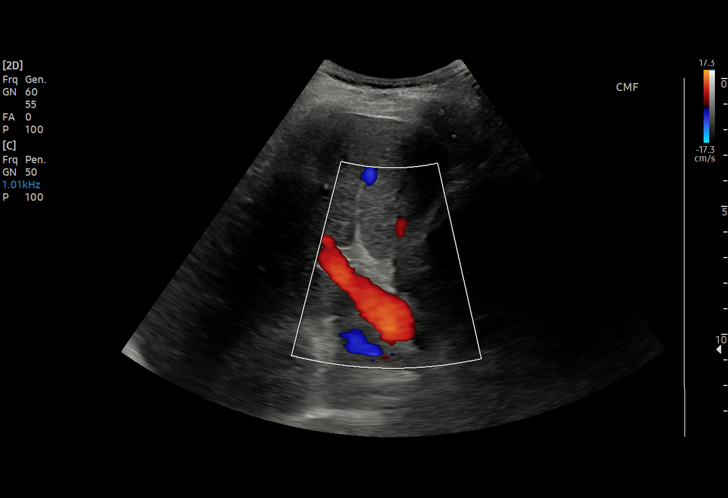

[15 of 25 positions shown; findings below may reference images not displayed]

FINDINGS: Gallbladder:

No gallstones or wall thickening visualized. No sonographic Murphy
sign noted by sonographer.

Common bile duct:

Diameter: 2.6 mm

Liver:

No focal lesion identified. Within normal limits in parenchymal
echogenicity. Portal vein is patent on color Doppler imaging with
normal direction of blood flow towards the liver.

Other: None.
IMPRESSION: No cholelithiasis or sonographic evidence of acute cholecystitis.

## 2022-01-09 MED ORDER — PANTOPRAZOLE SODIUM 40 MG IV SOLR
40.0000 mg | Freq: Two times a day (BID) | INTRAVENOUS | Status: DC
  Administered 2022-01-10 (×3): 40 mg via INTRAVENOUS
  Filled 2022-01-09 (×2): qty 10

## 2022-01-09 MED ORDER — POTASSIUM CHLORIDE 10 MEQ/100ML IV SOLN
10.0000 meq | INTRAVENOUS | Status: AC
  Administered 2022-01-09: 10 meq via INTRAVENOUS
  Filled 2022-01-09: qty 100

## 2022-01-09 MED ORDER — LIDOCAINE VISCOUS HCL 2 % MT SOLN
15.0000 mL | Freq: Once | OROMUCOSAL | Status: AC
  Administered 2022-01-09: 15 mL via ORAL
  Filled 2022-01-09: qty 15

## 2022-01-09 MED ORDER — PROCHLORPERAZINE EDISYLATE 10 MG/2ML IJ SOLN
5.0000 mg | Freq: Once | INTRAMUSCULAR | Status: AC
  Administered 2022-01-09: 5 mg via INTRAVENOUS
  Filled 2022-01-09: qty 2

## 2022-01-09 MED ORDER — PANTOPRAZOLE SODIUM 40 MG IV SOLR
40.0000 mg | Freq: Once | INTRAVENOUS | Status: AC
  Administered 2022-01-09: 40 mg via INTRAVENOUS
  Filled 2022-01-09: qty 10

## 2022-01-09 MED ORDER — SODIUM CHLORIDE 0.9 % IV SOLN: Freq: Once | INTRAVENOUS | Status: AC

## 2022-01-09 MED ORDER — PROCHLORPERAZINE EDISYLATE 10 MG/2ML IJ SOLN
10.0000 mg | Freq: Once | INTRAMUSCULAR | Status: AC
  Administered 2022-01-09: 10 mg via INTRAVENOUS
  Filled 2022-01-09: qty 2

## 2022-01-09 MED ORDER — LORAZEPAM 2 MG/ML IJ SOLN
1.0000 mg | Freq: Four times a day (QID) | INTRAMUSCULAR | Status: DC | PRN
  Administered 2022-01-09: 1 mg via INTRAVENOUS
  Filled 2022-01-09: qty 1

## 2022-01-09 MED ORDER — POTASSIUM CHLORIDE 20 MEQ PO PACK: 40.0000 meq | PACK | Freq: Two times a day (BID) | ORAL | Status: DC

## 2022-01-09 MED ORDER — SODIUM CHLORIDE 0.9 % IV BOLUS
1000.0000 mL | Freq: Once | INTRAVENOUS | Status: AC
  Administered 2022-01-09: 1000 mL via INTRAVENOUS

## 2022-01-09 MED ORDER — PROCHLORPERAZINE EDISYLATE 10 MG/2ML IJ SOLN
10.0000 mg | Freq: Four times a day (QID) | INTRAMUSCULAR | Status: DC | PRN
Start: 2022-01-09 — End: 2022-01-13
  Administered 2022-01-09 – 2022-01-13 (×13): 10 mg via INTRAVENOUS
  Filled 2022-01-09 (×13): qty 2

## 2022-01-09 MED ORDER — POTASSIUM CHLORIDE 20 MEQ PO PACK
40.0000 meq | PACK | Freq: Two times a day (BID) | ORAL | Status: AC
  Administered 2022-01-10 (×2): 40 meq via ORAL
  Filled 2022-01-09 (×2): qty 2

## 2022-01-09 MED ORDER — ALUM & MAG HYDROXIDE-SIMETH 200-200-20 MG/5ML PO SUSP
30.0000 mL | Freq: Once | ORAL | Status: AC
  Administered 2022-01-09: 30 mL via ORAL
  Filled 2022-01-09: qty 30

## 2022-01-09 MED ORDER — HYDRALAZINE HCL 20 MG/ML IJ SOLN: 10.0000 mg | Freq: Three times a day (TID) | INTRAMUSCULAR | Status: DC | PRN

## 2022-01-09 MED ORDER — LORAZEPAM 2 MG/ML IJ SOLN
1.0000 mg | Freq: Once | INTRAMUSCULAR | Status: AC
  Administered 2022-01-09: 1 mg via INTRAVENOUS
  Filled 2022-01-09: qty 1

## 2022-01-09 NOTE — Plan of Care (Signed)

## 2022-01-09 NOTE — H&P (Signed)
?History and Physical  ? ? ?Patient: Dorothy Wood EMV:[id number] DOB: [birth year] ?DOA: 01/09/2022 ?DOS: the patient was seen and examined on 01/09/2022 ?PCP: Denita Lung, MD  ?Patient coming from: Home ? ?Chief Complaint:  ?Chief Complaint  ?Patient presents with  ? Emesis  ? ?HPI: Dorothy Wood is a 61 y.o. female with medical history significant of ovarian cancer, gastritis, HTN, sjogrens, TIA, hypothyroidism. Presenting with intractable N/V. She was recently admitted for same and discharged 2 days ago. Yesterday she says she was doing well. She was able to tolerate juice, gatorade, and chicken soup. However, this morning, she started vomiting again. She says it was bilious. No hematemesis. She tried anti-emetics, but they didn't help. She didn't have any fever or diarrhea. She also notes that this morning she had urinary burning and increased frequency. When her symptoms worsened this morning, she decided to come back to the ED for assistance. She denies any other aggravating or alleviating factors.   ? ?Review of Systems: As mentioned in the history of present illness. All other systems reviewed and are negative. ?Past Medical History:  ?Diagnosis Date  ? Cancer Dorothy Wood)   ? OVARIAN  ? Colonic polyp   ? Gastritis   ? Hyperlipidemia   ? Hypertension   ? Sjogren's disease (Dorothy Wood)   ? Stroke Dorothy Wood)   ? Thyroid disease   ? HYPOTHYROIDISM  ? TIA (transient ischemic attack) 04/06/2021  ? ?Past Surgical History:  ?Procedure Laterality Date  ? ABDOMINAL HYSTERECTOMY    ? Ovarian cancer  ? BIOPSY  02/04/2021  ? Procedure: BIOPSY;  Surgeon: Ronald Lobo, MD;  Location: [facility];  Service: [facility];;  ? BREAST CYST ASPIRATION    ? CESAREAN SECTION    ? x2  ? COLONOSCOPY  10-06  ? Dr. Earnest Bailey  ? ESOPHAGOGASTRODUODENOSCOPY N/A 02/04/2021  ? Procedure: ESOPHAGOGASTRODUODENOSCOPY (EGD);  Surgeon: Ronald Lobo, MD;  Location: [facility];  Service: [facility];  Laterality: N/A;  ? TONSILLECTOMY    ? ?Social History:   reports that she has never smoked. She has never used smokeless tobacco. She reports current alcohol use of about 4.0 standard drinks per week. She reports that she does not use drugs. ? ?No Known Allergies ? ?Family History  ?Problem Relation Age of Onset  ? Mental illness Mother   ? Hypertension Mother   ? Heart failure Mother   ? CAD Father   ?     CABG at age 24  ? Mental illness Sister   ? Alcoholism Sister   ? Mental illness Brother   ? Alcoholism Brother   ? Mental illness Maternal Uncle   ? Mental illness Maternal Grandmother   ? Cancer Paternal Grandmother   ? ? ?Prior to Admission medications   ?Medication Sig Start Date End Date Taking? Authorizing Provider  ?aspirin EC 81 MG tablet Take 81 mg by mouth every morning. Swallow whole.   Yes [provider]  ?atorvastatin (LIPITOR) 80 MG tablet Take 1 tablet (80 mg total) by mouth daily. 10/09/21 10/09/22 Yes Denita Lung, MD  ?buPROPion (WELLBUTRIN XL) 300 MG 24 hr tablet Take 1 tablet (300 mg total) by mouth every morning. 12/03/21  Yes   ?guanFACINE (TENEX) 2 MG tablet Take 1 tablet by mouth every morning and 1 tablet at bedtime ?Patient taking differently: Take 2 mg by mouth in the morning and at bedtime. 12/03/21  Yes   ?hydrochlorothiazide (HYDRODIURIL) 25 MG tablet TAKE 1 TABLET BY MOUTH ONCE DAILY ?Patient taking  differently: Take 25 mg by mouth daily. 08/26/21 08/26/22 Yes Lelon Perla, MD  ?levothyroxine (EUTHYROX) 88 MCG tablet TAKE 1 TABLET BY MOUTH ONCE A DAY ?Patient taking differently: Take 88 mcg by mouth daily before breakfast. 12/23/21  Yes   ?LORazepam (ATIVAN) 1 MG tablet Take 1 tablet by mouth daily as needed for anxiety (30 day supply) ?Patient taking differently: Take 1 mg by mouth at bedtime as needed for sleep or anxiety. 12/25/21  Yes   ?methylphenidate (RITALIN) 20 MG tablet Take 1 tablet by mouth 3 times daily ?Patient taking differently: Take 20 mg by mouth See admin instructions. Take 20 mg by mouth two to three times  a day 07/03/21  Yes   ?mirabegron ER (MYRBETRIQ) 50 MG TB24 tablet Take 1 tablet by mouth daily ?Patient taking differently: Take 50 mg by mouth at bedtime. 12/08/21  Yes   ?omeprazole (PRILOSEC) 40 MG capsule Take 1 capsule (40 mg total) by mouth 2 (two) times daily. 01/07/22 02/06/22 Yes Pahwani, Einar Grad, MD  ?pilocarpine (SALAGEN) 5 MG tablet TAKE 1 TABLET BY MOUTH 4 TIMES DAILY ?Patient taking differently: Take 5 mg by mouth in the morning, at noon, in the evening, and at bedtime. 12/09/20 01/22/22 Yes Denita Lung, MD  ?polyethylene glycol (MIRALAX / GLYCOLAX) 17 g packet Take 8.5 g by mouth See admin instructions. Mix 8.5 grams of powder into the recommended amount of liquid and drink every morning   Yes [provider]  ?prochlorperazine (COMPAZINE) 5 MG tablet Take 1 tablet (5 mg total) by mouth every 6 (six) hours as needed for nausea or vomiting. 01/07/22  Yes Darliss Cheney, MD  ?sertraline (ZOLOFT) 50 MG tablet Take 1/2 tablet by mouth every morning for 1 week then take 1 tablet every morning ?Patient taking differently: Take 50 mg by mouth daily. 12/03/21  Yes   ?estradiol (ESTRACE) 0.5 MG tablet TAKE 1 TABLET BY MOUTH ONCE A DAY ?Patient not taking: No sig reported 06/03/20 04/07/21  Vania Rea, MD  ? ? ?Physical Exam: ?Vitals:  ? 01/09/22 1400 01/09/22 1430 01/09/22 1500 01/09/22 1530  ?BP: (!) 159/71 (!) 171/85 (!) 169/83 (!) 172/79  ?Pulse: 74 79 78 79  ?Resp:  18  18  ?Temp:      ?TempSrc:      ?SpO2: 98% 99% 99% 100%  ? ?General: 61 y.o. female resting in bed in NAD ?Eyes: PERRL, normal sclera ?ENMT: Nares patent w/o discharge, orophaynx clear, dentition normal, ears w/o discharge/lesions/ulcers ?Neck: Supple, trachea midline ?Cardiovascular: RRR, +S1, S2, no m/g/r, equal pulses throughout ?Respiratory: CTABL, no w/r/r, normal WOB ?GI: BS+, NDNT, no masses noted, no organomegaly noted ?MSK: No e/c/c ?Neuro: A&O x 3, no focal deficits ?Psyc: Appropriate interaction and affect,  calm/cooperative ? ?Data Reviewed: ? ?K+  3.4 ?CO2  19 ?BUN  14 ?Scr 0.81 ?WBC  9.4 ? ?US abdomen RUQ ?No cholelithiasis or sonographic evidence of acute cholecystitis. ? ?Assessment and Plan: ?No notes have been filed under this hospital service. ?Service: Hospitalist ?Intractable N/V ?    - place in obs, tele ?    - fluids, anti-emetics (compazine and ativan d/t prolonged QT) ?    - ab exam and [place] are benign  ?    - NPO for tonight ? ?Hypokalemia ?    - replace K+; check Mg2+ ? ?Prolonged QT ?    - watch QT prolonging meds; K+ to 4 and Mg2+ to 2 ? ?Dysuria ?    - UA w/  pyuria; check UCx; hold abx for now ? ?HTN ?    - resume home regimen when she can tolerate PO, for now PRNs available ? ?Hx of TIA ?    - resume home regimen when she can tolerate PO ? ?GERD ?    - PPI BID IV ? ?Anxiety ?Depression ?    - resume home regimen when she can tolerate PO ? ?Hypothyroidism ?    - resume home regimen when she can tolerate PO ? ?Advance Care Planning:   Code Status: FULL ? ?Consults: None ? ?Family Communication: w/ daughter at bedside ? ?Severity of Illness: ?The appropriate patient status for this patient is OBSERVATION. Observation status is judged to be reasonable and necessary in order to provide the required intensity of service to ensure the patient's safety. The patient's presenting symptoms, physical exam findings, and initial radiographic and laboratory data in the context of their medical condition is felt to place them at decreased risk for further clinical deterioration. Furthermore, it is anticipated that the patient will be medically stable for discharge from the hospital within 2 midnights of admission.  ? ?Author: ?Jonnie Finner, DO ?01/09/2022 3:38 PM ? ?For on call review [email or web address].  ?

## 2022-01-09 NOTE — ED Provider Notes (Signed)
?[facility] DEPT ?Provider Note ? ? ?CSN: [id number] ?Arrival date & time: 01/09/22  0841 ? ?  ? ?History ? ?Chief Complaint  ?Patient presents with  ? Emesis  ? ? ?Dorothy Wood is a 61 y.o. female. ? ?Patient is a 61 year old female who presents with nausea and vomiting.  She was just discharged for similar episode.  She was admitted from April 24 to April 26.  It was felt that this is related to severe GERD/gastritis.  She was admitted for a similar episode about a year prior to this and had an endoscopy which showed esophagitis.  During this recent admission, she had a CT scan which showed esophagitis.  There was also a tiny gallstone in her gallbladder but no signs of cholecystitis.  Also of note, she has a prolonged QT interval and antiemetics were limited.  She was discharged with a prescription for omeprazole and Compazine.  She did not take her omeprazole today but did take it yesterday.  She took Compazine today without improvement in symptoms.  She also says that she is having a panic attack and is very anxious.  She took half of an Ativan tablet earlier today without improvement in symptoms.  She had a normal bowel movement today.  No diarrhea or constipation.  No fevers.  Her emesis is nonbloody and nonbilious.  She has a little bit of pain in her upper abdomen which is similar to what she had during this recent hospitalization. ? ? ?  ? ?Home Medications ?Prior to Admission medications   ?Medication Sig Start Date End Date Taking? Authorizing Provider  ?aspirin EC 81 MG tablet Take 81 mg by mouth every morning. Swallow whole.    [provider]  ?atorvastatin (LIPITOR) 80 MG tablet Take 1 tablet (80 mg total) by mouth daily. 10/09/21 10/09/22  Denita Lung, MD  ?buPROPion (WELLBUTRIN XL) 300 MG 24 hr tablet Take 1 tablet (300 mg total) by mouth every morning. 12/03/21     ?guanFACINE (TENEX) 2 MG tablet Take 1 tablet by mouth every morning and 1 tablet at  bedtime ?Patient taking differently: Take 2 mg by mouth in the morning and at bedtime. 12/03/21     ?hydrochlorothiazide (HYDRODIURIL) 25 MG tablet TAKE 1 TABLET BY MOUTH ONCE DAILY ?Patient taking differently: Take 25 mg by mouth daily. 08/26/21 08/26/22  Lelon Perla, MD  ?levothyroxine (EUTHYROX) 88 MCG tablet TAKE 1 TABLET BY MOUTH ONCE A DAY ?Patient taking differently: Take 88 mcg by mouth daily before breakfast. 12/23/21     ?LORazepam (ATIVAN) 1 MG tablet Take 1 tablet by mouth daily as needed for anxiety (30 day supply) ?Patient taking differently: Take 1 mg by mouth at bedtime as needed for sleep or anxiety. 12/25/21     ?methylphenidate (RITALIN) 20 MG tablet Take 1 tablet by mouth 3 times daily ?Patient taking differently: Take 20 mg by mouth See admin instructions. Take 20 mg by mouth two to three times a day 07/03/21     ?mirabegron ER (MYRBETRIQ) 50 MG TB24 tablet Take 1 tablet by mouth daily ?Patient taking differently: Take 50 mg by mouth at bedtime. 12/08/21     ?omeprazole (PRILOSEC) 40 MG capsule Take 1 capsule (40 mg total) by mouth 2 (two) times daily. 01/07/22 02/06/22  Darliss Cheney, MD  ?pilocarpine (SALAGEN) 5 MG tablet TAKE 1 TABLET BY MOUTH 4 TIMES DAILY ?Patient taking differently: Take 5 mg by mouth 3 (three) times daily. 12/09/20 01/22/22  [employer],  Elyse Jarvis, MD  ?polyethylene glycol (MIRALAX / GLYCOLAX) 17 g packet Take 8.5 g by mouth See admin instructions. Mix 8.5 grams of powder into the recommended amount of liquid and drink every morning    [provider]  ?prochlorperazine (COMPAZINE) 5 MG tablet Take 1 tablet (5 mg total) by mouth every 6 (six) hours as needed for nausea or vomiting. 01/07/22   Darliss Cheney, MD  ?sertraline (ZOLOFT) 50 MG tablet Take 1/2 tablet by mouth every morning for 1 week then take 1 tablet every morning ?Patient taking differently: Take 50 mg by mouth daily. 12/03/21     ?estradiol (ESTRACE) 0.5 MG tablet TAKE 1 TABLET BY MOUTH ONCE A DAY ?Patient not  taking: No sig reported 06/03/20 04/07/21  Vania Rea, MD  ?   ? ?Allergies    ?Patient has no known allergies.   ? ?Review of Systems   ?Review of Systems  ?Constitutional:  Negative for chills, diaphoresis, fatigue and fever.  ?HENT:  Negative for congestion, rhinorrhea and sneezing.   ?Eyes: Negative.   ?Respiratory:  Negative for cough, chest tightness and shortness of breath.   ?Cardiovascular:  Negative for chest pain and leg swelling.  ?Gastrointestinal:  Positive for abdominal pain, nausea and vomiting. Negative for blood in stool and diarrhea.  ?Genitourinary:  Negative for difficulty urinating, flank pain, frequency and hematuria.  ?Musculoskeletal:  Negative for arthralgias and back pain.  ?Skin:  Negative for rash.  ?Neurological:  Negative for dizziness, speech difficulty, weakness, numbness and headaches.  ? ?Physical Exam ?Updated Vital Signs ?BP (!) 167/80   Pulse 71   Temp 98 ?F (36.7 ?C) (Oral) Comment: Simultaneous filing. User may not have seen previous data. Comment (Src): Simultaneous filing. User may not have seen previous data.  Resp 18   SpO2 98%  ?Physical Exam ?Constitutional:   ?   Appearance: She is well-developed.  ?HENT:  ?   Head: Normocephalic and atraumatic.  ?Eyes:  ?   Pupils: Pupils are equal, round, and reactive to light.  ?Cardiovascular:  ?   Rate and Rhythm: Normal rate and regular rhythm.  ?   Heart sounds: Normal heart sounds.  ?Pulmonary:  ?   Effort: Pulmonary effort is normal. No respiratory distress.  ?   Breath sounds: Normal breath sounds. No wheezing or rales.  ?Chest:  ?   Chest wall: No tenderness.  ?Abdominal:  ?   General: Bowel sounds are normal.  ?   Palpations: Abdomen is soft.  ?   Tenderness: There is abdominal tenderness. There is no guarding or rebound.  ?   Comments: Mild tenderness to the epigastrium and right upper quadrant  ?Musculoskeletal:     ?   General: Normal range of motion.  ?   Cervical back: Normal range of motion and neck supple.   ?Lymphadenopathy:  ?   Cervical: No cervical adenopathy.  ?Skin: ?   General: Skin is warm and dry.  ?   Findings: No rash.  ?Neurological:  ?   Mental Status: She is alert and oriented to person, place, and time.  ? ? ?ED Results / Procedures / Treatments   ?Labs ?(all labs ordered are listed, but only abnormal results are displayed) ?Labs Reviewed  ?COMPREHENSIVE METABOLIC PANEL - Abnormal; Notable for the following components:  ?    Result Value  ? Potassium 3.4 (*)   ? CO2 19 (*)   ? Glucose, Bld 107 (*)   ? All other components within normal  limits  ?URINALYSIS, ROUTINE W REFLEX MICROSCOPIC - Abnormal; Notable for the following components:  ? APPearance HAZY (*)   ? pH 9.0 (*)   ? Ketones, ur 5 (*)   ? Leukocytes,Ua MODERATE (*)   ? Bacteria, UA RARE (*)   ? All other components within normal limits  ?LIPASE, BLOOD  ?CBC WITH DIFFERENTIAL/PLATELET  ? ? ?EKG ?EKG Interpretation ? ?Date/Time:  Friday January 09 2022 09:30:09 EDT ?Ventricular Rate:  78 ?PR Interval:    ?QRS Duration: 100 ?QT Interval:  429 ?QTC Calculation: 489 ?R Axis:   -38 ?Text Interpretation: probable sinus rhythm Left axis deviation Borderline prolonged QT interval Confirmed by Malvin Johns [phone]) on 01/09/2022 10:09:24 AM ? ?Radiology ?US Abdomen Limited RUQ (LIVER/GB) ? ?Result Date: 01/09/2022 ?CLINICAL DATA:  Right upper quadrant pain gallbladder sludge. EXAM: ULTRASOUND ABDOMEN LIMITED RIGHT UPPER QUADRANT COMPARISON:  01/06/2022 FINDINGS: Gallbladder: No gallstones or wall thickening visualized. No sonographic Murphy sign noted by sonographer. Common bile duct: Diameter: 2.6 mm Liver: No focal lesion identified. Within normal limits in parenchymal echogenicity. Portal vein is patent on color Doppler imaging with normal direction of blood flow towards the liver. Other: None. IMPRESSION: No cholelithiasis or sonographic evidence of acute cholecystitis. Electronically Signed   By: Kathreen Devoid M.D.   On: 01/09/2022 14:02    ? ?Procedures ?Procedures  ? ? ?Medications Ordered in ED ?Medications  ?sodium chloride 0.9 % bolus 1,000 mL (0 mLs Intravenous Stopped 01/09/22 1209)  ?LORazepam (ATIVAN) injection 1 mg (1 mg Intravenous Given 01/09/22 0939)  ?pant

## 2022-01-09 NOTE — ED Triage Notes (Signed)
Pt arrived via POV, c/o dizziness, vomiting. States was just discharged. Follow up for surgery to remove gallbladder in a few wks. Vomiting and pain worsening the since last night.  ?

## 2022-01-09 NOTE — Progress Notes (Signed)
Md called regarding pt not being able to tolerate the IV potassium d/t burning.  Md also made aware of pt nausea/vomiting. Awaiting new orders.  ?

## 2022-01-10 ENCOUNTER — Inpatient Hospital Stay (HOSPITAL_COMMUNITY): Payer: 59

## 2022-01-10 DIAGNOSIS — K449 Diaphragmatic hernia without obstruction or gangrene: Secondary | ICD-10-CM | POA: Diagnosis present

## 2022-01-10 DIAGNOSIS — E039 Hypothyroidism, unspecified: Secondary | ICD-10-CM | POA: Diagnosis present

## 2022-01-10 DIAGNOSIS — R112 Nausea with vomiting, unspecified: Secondary | ICD-10-CM | POA: Diagnosis present

## 2022-01-10 DIAGNOSIS — F410 Panic disorder [episodic paroxysmal anxiety] without agoraphobia: Secondary | ICD-10-CM | POA: Diagnosis present

## 2022-01-10 DIAGNOSIS — E871 Hypo-osmolality and hyponatremia: Secondary | ICD-10-CM | POA: Diagnosis not present

## 2022-01-10 DIAGNOSIS — M3500 Sicca syndrome, unspecified: Secondary | ICD-10-CM | POA: Diagnosis present

## 2022-01-10 DIAGNOSIS — E785 Hyperlipidemia, unspecified: Secondary | ICD-10-CM | POA: Diagnosis present

## 2022-01-10 DIAGNOSIS — E876 Hypokalemia: Secondary | ICD-10-CM | POA: Diagnosis present

## 2022-01-10 DIAGNOSIS — Z79890 Hormone replacement therapy (postmenopausal): Secondary | ICD-10-CM | POA: Diagnosis not present

## 2022-01-10 DIAGNOSIS — Z8249 Family history of ischemic heart disease and other diseases of the circulatory system: Secondary | ICD-10-CM | POA: Diagnosis not present

## 2022-01-10 DIAGNOSIS — F341 Dysthymic disorder: Secondary | ICD-10-CM | POA: Diagnosis present

## 2022-01-10 DIAGNOSIS — Z90710 Acquired absence of both cervix and uterus: Secondary | ICD-10-CM | POA: Diagnosis not present

## 2022-01-10 DIAGNOSIS — R300 Dysuria: Secondary | ICD-10-CM | POA: Diagnosis present

## 2022-01-10 DIAGNOSIS — R9431 Abnormal electrocardiogram [ECG] [EKG]: Secondary | ICD-10-CM | POA: Diagnosis present

## 2022-01-10 DIAGNOSIS — K219 Gastro-esophageal reflux disease without esophagitis: Secondary | ICD-10-CM | POA: Diagnosis present

## 2022-01-10 DIAGNOSIS — Z7982 Long term (current) use of aspirin: Secondary | ICD-10-CM | POA: Diagnosis not present

## 2022-01-10 DIAGNOSIS — I10 Essential (primary) hypertension: Secondary | ICD-10-CM | POA: Diagnosis present

## 2022-01-10 DIAGNOSIS — Z888 Allergy status to other drugs, medicaments and biological substances status: Secondary | ICD-10-CM | POA: Diagnosis not present

## 2022-01-10 DIAGNOSIS — Z818 Family history of other mental and behavioral disorders: Secondary | ICD-10-CM | POA: Diagnosis not present

## 2022-01-10 DIAGNOSIS — Z8543 Personal history of malignant neoplasm of ovary: Secondary | ICD-10-CM | POA: Diagnosis not present

## 2022-01-10 DIAGNOSIS — Z8673 Personal history of transient ischemic attack (TIA), and cerebral infarction without residual deficits: Secondary | ICD-10-CM | POA: Diagnosis not present

## 2022-01-10 DIAGNOSIS — Z79899 Other long term (current) drug therapy: Secondary | ICD-10-CM | POA: Diagnosis not present

## 2022-01-10 LAB — COMPREHENSIVE METABOLIC PANEL
ALT: 31 U/L (ref 0–44)
AST: 26 U/L (ref 15–41)
Albumin: 4.3 g/dL (ref 3.5–5.0)
Alkaline Phosphatase: 80 U/L (ref 38–126)
Anion gap: 10 (ref 5–15)
BUN: 10 mg/dL (ref 8–23)
CO2: 22 mmol/L (ref 22–32)
Calcium: 9.1 mg/dL (ref 8.9–10.3)
Chloride: 101 mmol/L (ref 98–111)
Creatinine, Ser: 0.53 mg/dL (ref 0.44–1.00)
GFR, Estimated: >60 mL/min (ref >60)
Glucose, Bld: 103 mg/dL — ABNORMAL HIGH (ref 70–99)
Potassium: 3.5 mmol/L (ref 3.5–5.1)
Sodium: 133 mmol/L — ABNORMAL LOW (ref 135–145)
Total Bilirubin: 0.7 mg/dL (ref 0.3–1.2)
Total Protein: 6.9 g/dL (ref 6.5–8.1)

## 2022-01-10 LAB — GLUCOSE, CAPILLARY: Glucose-Capillary: 144 mg/dL — ABNORMAL HIGH (ref 70–99)

## 2022-01-10 LAB — CBC
HCT: 42.5 % (ref 36.0–46.0)
Hemoglobin: 14.3 g/dL (ref 12.0–15.0)
MCH: 30 pg (ref 26.0–34.0)
MCHC: 33.6 g/dL (ref 30.0–36.0)
MCV: 89.3 fL (ref 80.0–100.0)
Platelets: 281 10*3/uL (ref 150–400)
RBC: 4.76 MIL/uL (ref 3.87–5.11)
RDW: 12.5 % (ref 11.5–15.5)
WBC: 9.8 10*3/uL (ref 4.0–10.5)
nRBC: 0 % (ref 0.0–0.2)

## 2022-01-10 IMAGING — DX DG CHEST 1V PORT
1 series · 1 of 1 positions shown · non-contrast
Comparison: [DATE]

CLINICAL DATA: Dyspnea

EXAM:
PORTABLE CHEST 1 VIEW

[chest ap]
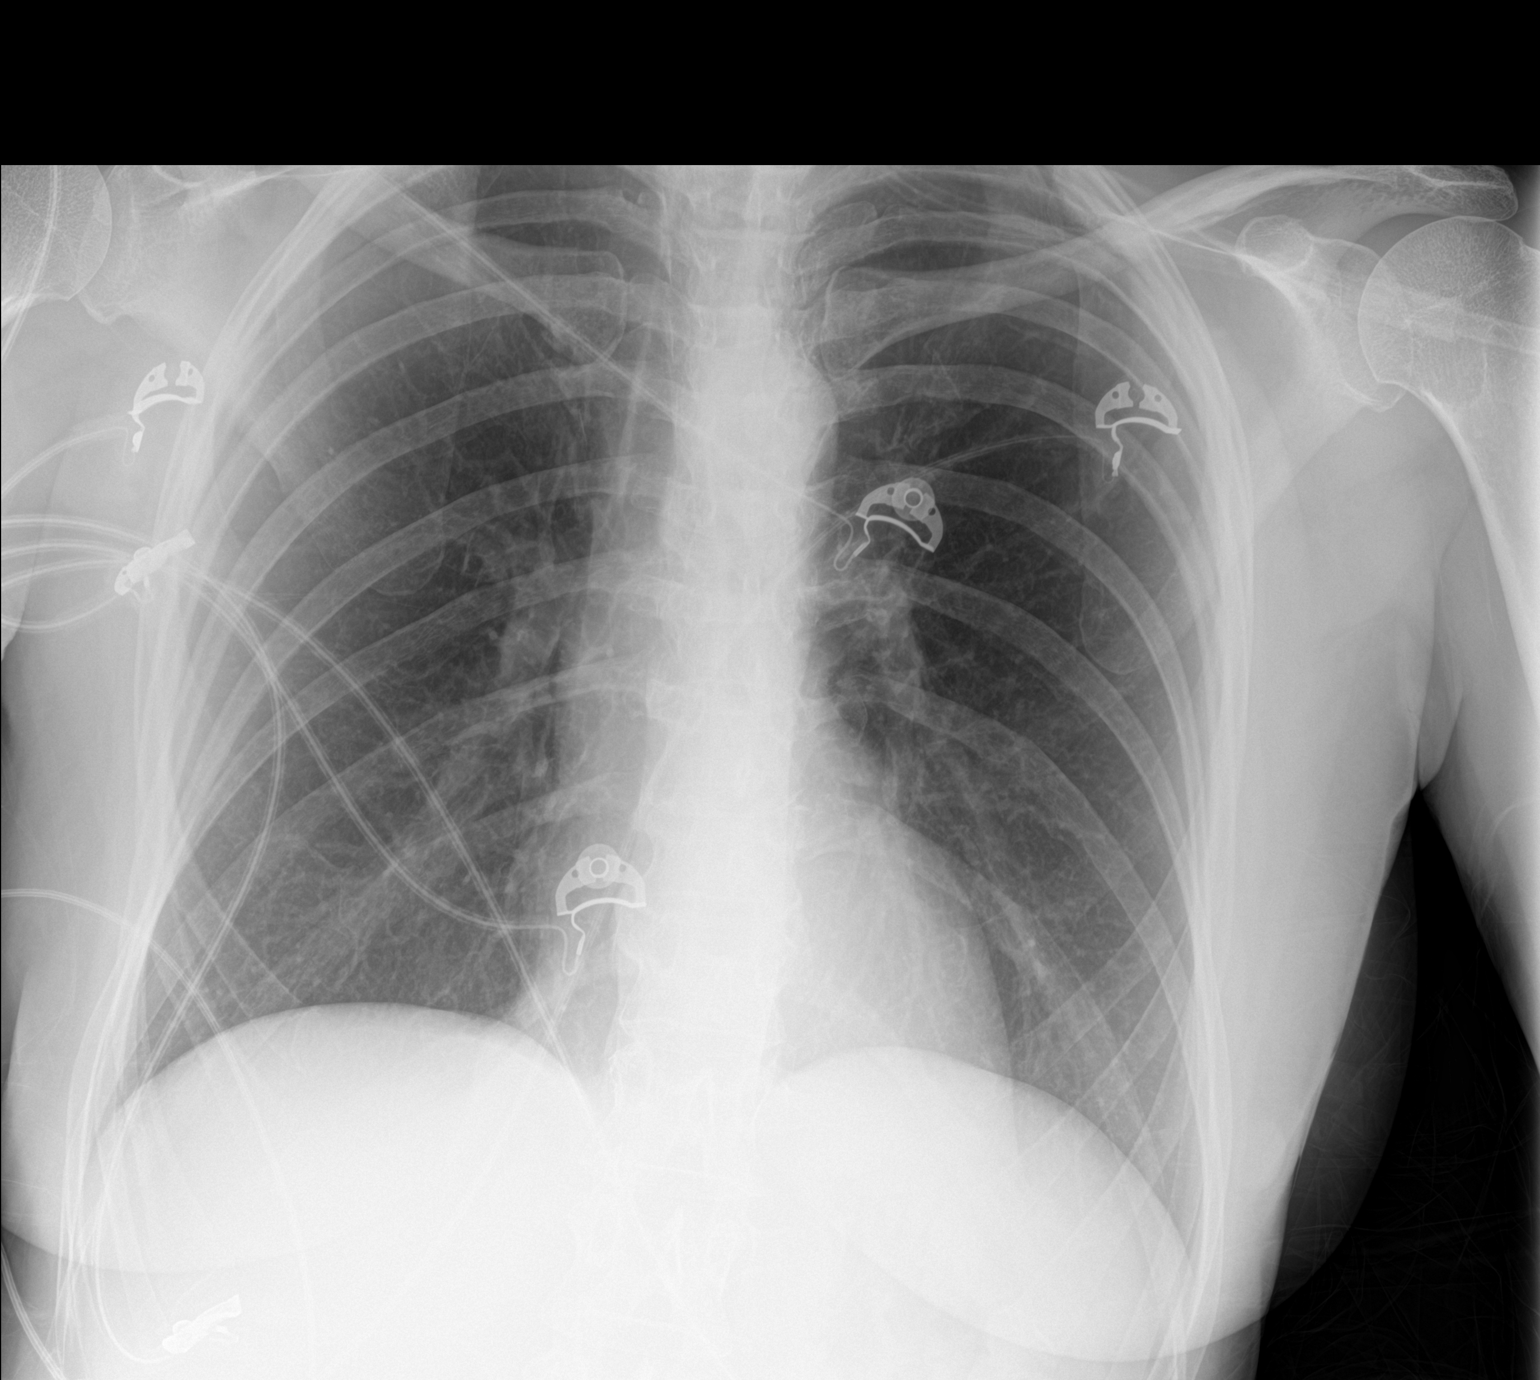

[1 of 1 positions shown; findings below may reference images not displayed]

FINDINGS: The heart size and mediastinal contours are within normal limits.
Both lungs are clear. The visualized skeletal structures are
unremarkable. Partially imaged metallic densities in the inferior
cervical spine.
IMPRESSION: No active disease.

## 2022-01-10 MED ORDER — ASPIRIN EC 81 MG PO TBEC
81.0000 mg | DELAYED_RELEASE_TABLET | Freq: Every morning | ORAL | Status: DC
  Administered 2022-01-10 – 2022-01-13 (×4): 81 mg via ORAL
  Filled 2022-01-10 (×4): qty 1

## 2022-01-10 MED ORDER — HYDRALAZINE HCL 20 MG/ML IJ SOLN
10.0000 mg | Freq: Four times a day (QID) | INTRAMUSCULAR | Status: DC | PRN
  Administered 2022-01-10 – 2022-01-11 (×2): 10 mg via INTRAVENOUS
  Filled 2022-01-10 (×2): qty 1

## 2022-01-10 MED ORDER — DIPHENHYDRAMINE HCL 50 MG/ML IJ SOLN
25.0000 mg | Freq: Four times a day (QID) | INTRAMUSCULAR | Status: DC | PRN
  Administered 2022-01-10: 25 mg via INTRAVENOUS
  Filled 2022-01-10: qty 1

## 2022-01-10 MED ORDER — POTASSIUM CHLORIDE 2 MEQ/ML IV SOLN: INTRAVENOUS | Status: DC

## 2022-01-10 MED ORDER — HYDROCHLOROTHIAZIDE 25 MG PO TABS
25.0000 mg | ORAL_TABLET | Freq: Every day | ORAL | Status: DC
  Administered 2022-01-10 – 2022-01-13 (×4): 25 mg via ORAL
  Filled 2022-01-10 (×4): qty 1

## 2022-01-10 MED ORDER — FAMOTIDINE IN NACL 20-0.9 MG/50ML-% IV SOLN
20.0000 mg | Freq: Two times a day (BID) | INTRAVENOUS | Status: DC
  Administered 2022-01-10 – 2022-01-11 (×2): 20 mg via INTRAVENOUS
  Filled 2022-01-10 (×2): qty 50

## 2022-01-10 MED ORDER — METHYLPHENIDATE HCL 10 MG PO TABS: 20.0000 mg | ORAL_TABLET | Freq: Three times a day (TID) | ORAL | Status: DC | PRN

## 2022-01-10 MED ORDER — POLYETHYLENE GLYCOL 3350 17 G PO PACK
17.0000 g | PACK | Freq: Every day | ORAL | Status: DC
  Administered 2022-01-10: 17 g via ORAL
  Filled 2022-01-10 (×3): qty 1

## 2022-01-10 MED ORDER — BUPROPION HCL ER (XL) 300 MG PO TB24
300.0000 mg | ORAL_TABLET | Freq: Every morning | ORAL | Status: DC
  Administered 2022-01-10 – 2022-01-13 (×4): 300 mg via ORAL
  Filled 2022-01-10 (×4): qty 1

## 2022-01-10 MED ORDER — POTASSIUM CHLORIDE 2 MEQ/ML IV SOLN
INTRAVENOUS | Status: DC
  Filled 2022-01-10 (×7): qty 1000

## 2022-01-10 MED ORDER — METOCLOPRAMIDE HCL 5 MG/ML IJ SOLN
10.0000 mg | Freq: Three times a day (TID) | INTRAMUSCULAR | Status: DC
  Administered 2022-01-10 (×2): 10 mg via INTRAVENOUS
  Filled 2022-01-10 (×2): qty 2

## 2022-01-10 MED ORDER — METOCLOPRAMIDE HCL 5 MG/ML IJ SOLN
10.0000 mg | Freq: Three times a day (TID) | INTRAMUSCULAR | Status: DC
Start: 2022-01-10 — End: 2022-01-10
  Administered 2022-01-10: 10 mg via INTRAVENOUS
  Filled 2022-01-10: qty 2

## 2022-01-10 MED ORDER — LEVOTHYROXINE SODIUM 88 MCG PO TABS
88.0000 ug | ORAL_TABLET | Freq: Every day | ORAL | Status: DC
  Administered 2022-01-11 – 2022-01-13 (×3): 88 ug via ORAL
  Filled 2022-01-10 (×3): qty 1

## 2022-01-10 MED ORDER — SODIUM CHLORIDE 0.9 % IV BOLUS: 250.0000 mL | Freq: Once | INTRAVENOUS | Status: DC

## 2022-01-10 NOTE — Progress Notes (Signed)
?PROGRESS NOTE ? ? ? ?Dorothy Wood  TKZ:[id number] DOB: 1961-08-27 DOA: 01/09/2022 ?PCP: Denita Lung, MD ? ? ?Brief Narrative:  ?HPI: Dorothy Wood is a 61 y.o. female with medical history significant of ovarian cancer, gastritis, HTN, sjogrens, TIA, hypothyroidism. Presenting with intractable N/V. She was recently admitted for same and discharged 2 days ago. Yesterday she says she was doing well. She was able to tolerate juice, gatorade, and chicken soup. However, this morning, she started vomiting again. She says it was bilious. No hematemesis. She tried anti-emetics, but they didn't help. She didn't have any fever or diarrhea. She also notes that this morning she had urinary burning and increased frequency. When her symptoms worsened this morning, she decided to come back to the ED for assistance. She denies any other aggravating or alleviating factors.   ?  ? ?Assessment & Plan: ?  ?Principal Problem: ?  Intractable nausea and vomiting ?Active Problems: ?  Prolonged QT interval ?  Sjogren's syndrome ([place]) ?  Hypothyroidism ?  Essential hypertension ?  Hyperlipidemia, unspecified ?  Dysthymia ?  GERD (gastroesophageal reflux disease) ?  History of TIA (transient ischemic attack) ? ?Intractable nausea and vomiting secondary to severe GERD: This is her fourth admission.  She claims that she was compliant with her medications and dietary modifications that I had advised her during recent hospitalization.  Unfortunately, due to having QTc prolonged admission, we are very limited with over antiemetic medications.  She is already on Compazine as needed but is still nauseous, I will start her on Reglan 10 mg every 8 hours scheduled.  We will start her on gentle IV hydration to avoid dehydration.  Start on clear liquid diet.  Continue PPI. ? ?History of cholelithiasis: Recommended outpatient follow-up with general surgery for elective cholecystectomy. ? ?Prolonged QT interval: Monitor closely and avoid QTc  prolonging medications. ? ?Sjogren's syndrome ([place]) ?Pilocarpine ?Follow with rheum ?  ?Essential hypertension: Slightly elevated.  Resume hydrochlorothiazide and placed on as needed hydralazine. ?  ?Hypothyroidism: Resume levothyroxine. ?  ? ?DVT prophylaxis: SCDs Start: 01/09/22 1728 ?  Code Status: Full Code  ?Family Communication:  None present at bedside.  Plan of care discussed with patient in length and he/she verbalized understanding and agreed with it. ? ?Status is: Observation ?The patient will require care spanning > 2 midnights and should be moved to inpatient because: Still symptomatic. ? ? ?Estimated body mass index is 19.97 kg/m? as calculated from the following: ?  Height as of this encounter: '5\' 5"'$  (1.651 m). ?  Weight as of this encounter: 54.4 kg. ? ?  ?Nutritional Assessment: ?Body mass index is 19.97 kg/m?Marland KitchenMarland Kitchen ?Seen by dietician.  I agree with the assessment and plan as outlined below: ?Nutrition Status: ?  ?  ?  ? ?. ?Skin Assessment: ?I have examined the patient's skin and I agree with the wound assessment as performed by the wound care RN as outlined below: ?  ? ?Consultants:  ?None ? ?Procedures:  ?None ? ?Antimicrobials:  ?Anti-infectives (From admission, onward)  ? ? None  ? ?  ?  ? ? ?Subjective: ?Seen and examined.  Still with nausea.  No other complaint. ? ?Objective: ?Vitals:  ? 01/09/22 1757 01/09/22 2147 01/10/22 0145 01/10/22 0636  ?BP:  (!) 166/80 (!) 162/74 (!) 160/78  ?Pulse:  89 88 83  ?Resp:  '17 17 17  '$ ?Temp:  98.4 ?F (36.9 ?C) 98.4 ?F (36.9 ?C) 98.3 ?F (36.8 ?C)  ?TempSrc:  Oral Oral  Oral  ?SpO2:  98% 100% 99%  ?Weight: 54.4 kg     ?Height: '5\' 5"'$  (1.651 m)     ? ? ?Intake/Output Summary (Last 24 hours) at 01/10/2022 1202 ?Last data filed at 01/09/2022 1800 ?Gross per 24 hour  ?Intake 1051.57 ml  ?Output --  ?Net 1051.57 ml  ? ?Filed Weights  ? 01/09/22 1757  ?Weight: 54.4 kg  ? ? ?Examination: ? ?General exam: Appears calm and comfortable  ?Respiratory system: Clear to  auscultation. Respiratory effort normal. ?Cardiovascular system: S1 & S2 heard, RRR. No JVD, murmurs, rubs, gallops or clicks. No pedal edema. ?Gastrointestinal system: Abdomen is nondistended, soft and nontender. No organomegaly or masses felt. Normal bowel sounds heard. ?Central nervous system: Alert and oriented. No focal neurological deficits. ?Extremities: Symmetric 5 x 5 power. ?Skin: No rashes, lesions or ulcers ?Psychiatry: Judgement and insight appear normal. Mood & affect appropriate.  ? ? ?Data Reviewed: I have personally reviewed following labs and imaging studies ? ?CBC: ?Recent Labs  ?Lab 01/05/22 ?0120 01/06/22 ?7096 01/09/22 ?0950 01/10/22 ?2836  ?WBC 15.1* 10.5 9.4 9.8  ?NEUTROABS  --   --  6.9  --   ?HGB 13.6 12.2 14.0 14.3  ?HCT 39.2 36.7 40.2 42.5  ?MCV 87.3 91.5 87.8 89.3  ?PLT 325 243 283 281  ? ?Basic Metabolic Panel: ?Recent Labs  ?Lab 01/05/22 ?0120 01/06/22 ?6294 01/09/22 ?0950 01/10/22 ?7654  ?NA 136 138 139 133*  ?K 3.5 4.2 3.4* 3.5  ?CL 99 112* 106 101  ?CO2 22 22 19* 22  ?GLUCOSE 183* 111* 107* 103*  ?BUN '14 12 14 10  '$ ?CREATININE 0.95 0.63 0.81 0.53  ?CALCIUM 10.2 8.9 9.4 9.1  ?MG 1.5* 2.2 1.8  --   ?PHOS  --  2.8  --   --   ? ?GFR: ?Estimated Creatinine Clearance: 63.4 mL/min (by C-G formula based on SCr of 0.53 mg/dL). ?Liver Function Tests: ?Recent Labs  ?Lab 01/05/22 ?0120 01/06/22 ?6503 01/09/22 ?0950 01/10/22 ?5465  ?AST 39 29 36 26  ?ALT 35 29 40 31  ?ALKPHOS 86 70 78 80  ?BILITOT 0.8 0.4 0.9 0.7  ?PROT 7.3 6.2* 7.1 6.9  ?ALBUMIN 4.6 4.0 4.3 4.3  ? ?Recent Labs  ?Lab 01/05/22 ?0120 01/09/22 ?6812  ?LIPASE 32 37  ? ?No results for input(s): AMMONIA in the last 168 hours. ?Coagulation Profile: ?No results for input(s): INR, PROTIME in the last 168 hours. ?Cardiac Enzymes: ?No results for input(s): CKTOTAL, CKMB, CKMBINDEX, TROPONINI in the last 168 hours. ?BNP (last 3 results) ?No results for input(s): PROBNP in the last 8760 hours. ?HbA1C: ?No results for input(s): HGBA1C in the  last 72 hours. ?CBG: ?No results for input(s): GLUCAP in the last 168 hours. ?Lipid Profile: ?No results for input(s): CHOL, HDL, LDLCALC, TRIG, CHOLHDL, LDLDIRECT in the last 72 hours. ?Thyroid Function Tests: ?No results for input(s): TSH, T4TOTAL, FREET4, T3FREE, THYROIDAB in the last 72 hours. ?Anemia Panel: ?No results for input(s): VITAMINB12, FOLATE, FERRITIN, TIBC, IRON, RETICCTPCT in the last 72 hours. ?Sepsis Labs: ?No results for input(s): PROCALCITON, LATICACIDVEN in the last 168 hours. ? ?No results found for this or any previous visit (from the past 240 hour(s)).  ? ?Radiology Studies: ?US Abdomen Limited RUQ (LIVER/GB) ? ?Result Date: 01/09/2022 ?CLINICAL DATA:  Right upper quadrant pain gallbladder sludge. EXAM: ULTRASOUND ABDOMEN LIMITED RIGHT UPPER QUADRANT COMPARISON:  01/06/2022 FINDINGS: Gallbladder: No gallstones or wall thickening visualized. No sonographic Murphy sign noted by sonographer. Common bile duct: Diameter: 2.6 mm  Liver: No focal lesion identified. Within normal limits in parenchymal echogenicity. Portal vein is patent on color Doppler imaging with normal direction of blood flow towards the liver. Other: None. IMPRESSION: No cholelithiasis or sonographic evidence of acute cholecystitis. Electronically Signed   By: Kathreen Devoid M.D.   On: 01/09/2022 14:02   ? ?Scheduled Meds: ? aspirin EC  81 mg Oral q morning  ? buPROPion  300 mg Oral q morning  ? [START ON 01/11/2022] levothyroxine  88 mcg Oral QAC breakfast  ? metoCLOPramide (REGLAN) injection  10 mg Intravenous Q8H  ? pantoprazole (PROTONIX) IV  40 mg Intravenous Q12H  ? ?Continuous Infusions: ? ? LOS: 0 days  ? ?Time spent: 30 minutes ? ?Darliss Cheney, MD ?[facility] ? ?01/10/2022, 12:02 PM  ? ?*Please note that this is a verbal dictation therefore any spelling or grammatical errors are due to the "[place]" system interpretation. ? ?Please page via [place] and do not message via secure chat for urgent patient care  matters. Secure chat can be used for non urgent patient care matters. ? ?How to contact the [facility] Attending or Consulting provider [place] or covering provider during after hours [place], for this patient?  ?Check the care

## 2022-01-10 NOTE — Progress Notes (Signed)
Patient ID: Dorothy Wood, female   DOB: Jan 30, 1961, 61 y.o.   MRN: [id number] ? ?Patient requesting nausea medication. The only medication available at this time is IV Ativan but patient has prolonged QT. MD paged. ? ?Haydee Salter, RN ? ?

## 2022-01-10 NOTE — Plan of Care (Addendum)
Situation: Called to bedside, pt developing erythema on face,neck and upper thorax associated with c/o slight SOB on assessment. Per pt and RN, new medication that she has had is metoclopramide and noticed started feeling bad since then and is worsening gradually. ? ?Assessment and plan; ?-d/cd metoclopramide ?-starting benadryl '25mg'$  PRN ?-switched pantoprazole to famotidine ?-placing pt on [place] 2L for now ?-obtaining CXR ?-will continue to follow up pt's progress with above intervention and if worsens, will advance interventions to include steroid and epinephrine and any modifications depending on pt's response. ?

## 2022-01-10 NOTE — Progress Notes (Signed)
?   01/10/22 2257  ?Vitals  ?Temp 98.3 ?F (36.8 ?C)  ?Temp Source Oral  ?BP (!) 145/64  ?MAP (mmHg) 90  ?BP Location Right Arm  ?BP Method Automatic  ?Pulse Rate (!) 121  ?ECG Heart Rate (!) 120  ?Resp (!) 29  ?Level of Consciousness  ?Level of Consciousness Alert  ?Oxygen Therapy  ?SpO2 100 %  ?O2 Device Room Air  ?MEWS Score  ?MEWS Temp 0  ?MEWS Systolic 0  ?MEWS Pulse 2  ?MEWS RR 2  ?MEWS LOC 0  ?MEWS Score 4  ?MEWS Score Color Red  ? ?Patient is nauseous, shaky, flushed and SHOB. States that "there is something wrong." Gave Compazine '10mg'$  for nausea relief. Notified provider.  ?

## 2022-01-11 DIAGNOSIS — R112 Nausea with vomiting, unspecified: Secondary | ICD-10-CM | POA: Diagnosis not present

## 2022-01-11 LAB — URINE CULTURE: Culture: <10000 — AB

## 2022-01-11 MED ORDER — PANTOPRAZOLE SODIUM 40 MG IV SOLR
40.0000 mg | Freq: Two times a day (BID) | INTRAVENOUS | Status: DC
  Administered 2022-01-11 – 2022-01-13 (×4): 40 mg via INTRAVENOUS
  Filled 2022-01-11 (×3): qty 10

## 2022-01-11 MED ORDER — SODIUM CHLORIDE 0.9 % IV BOLUS
250.0000 mL | Freq: Once | INTRAVENOUS | Status: AC
  Administered 2022-01-11: 250 mL via INTRAVENOUS

## 2022-01-11 NOTE — Progress Notes (Signed)
Rapid Response Event Note  ? ?Reason for Call : pt having Nausea  ? ? ?Initial Focused Assessment: Pt A/O, nausea, very uncomfortable. Pt has flushed her face, neck and upper chest very red.  Pt is denying to have any itching .  Pt seems anxious.  Pulses intact.  States ' I do not know what but something is wrong.'   ? ? ?Interventions: VSS.  See flow sheet.  Provider called to the bedside as pt has develop this redness on her face, neck and neck.  New orders received and indicated.  Pt seems to look better after given benadryl IV. ? ? ?Plan of Care: Pt will remain in current location and instructed to call if she needs anymore assistance, for now.  Per [facility], NP. ? ? ? ?Event Summary:  ? ?MD Notified: yes ?Call Time: 2240 ?Arrival Time: 2247 ?End Time: 0005 ? ?Dyann Ruddle, RN ?

## 2022-01-11 NOTE — Progress Notes (Signed)
?PROGRESS NOTE ? ? ? ?Dorothy Wood  GYB:[id number] DOB: 08-21-1961 DOA: 01/09/2022 ?PCP: Denita Lung, MD ? ? ?Brief Narrative:  ?HPI: Dorothy Wood is a 61 y.o. female with medical history significant of ovarian cancer, gastritis, HTN, sjogrens, TIA, hypothyroidism. Presenting with intractable N/V. She was recently admitted for same and discharged 2 days ago. Yesterday she says she was doing well. She was able to tolerate juice, gatorade, and chicken soup. However, this morning, she started vomiting again. She says it was bilious. No hematemesis. She tried anti-emetics, but they didn't help. She didn't have any fever or diarrhea. She also notes that this morning she had urinary burning and increased frequency. When her symptoms worsened this morning, she decided to come back to the ED for assistance. She denies any other aggravating or alleviating factors.   ?  ? ?Assessment & Plan: ?  ?Principal Problem: ?  Intractable nausea and vomiting ?Active Problems: ?  Prolonged QT interval ?  Sjogren's syndrome ([place]) ?  Hypothyroidism ?  Essential hypertension ?  Hyperlipidemia, unspecified ?  Dysthymia ?  GERD (gastroesophageal reflux disease) ?  History of TIA (transient ischemic attack) ? ?Intractable nausea and vomiting secondary to severe GERD: This is her fourth admission.  She claims that she was compliant with her medications and dietary modifications that I had advised her during recent hospitalization.  Unfortunately, due to having QTc prolonged admission, we are very limited with over antiemetic medications.  Her symptoms were persistent despite of being on Compazine, for that reason, Reglan was started yesterday but patient had allergic reaction to that.  Reglan was stopped.  We will continue Compazine.  Patient had requested GI consultation yesterday.  Patient used to see Dr. Cristina Gong of [facility] in the past who retired in December.  Patient has requested to see [facility] who was consulted in the  morning of 01/10/2022.  They had initially mentioned that they will see patient today however [facility] refused the consult because patient was established with [facility].  Dr. Michail Sermon is aware and will see patient.  I will continue her on clear liquid diet. ? ?History of cholelithiasis: Recommended outpatient follow-up with general surgery for elective cholecystectomy. ? ?Prolonged QT interval: Monitor closely and avoid QTc prolonging medications. ? ?Sjogren's syndrome ([place]) ?Pilocarpine ?Follow with rheum ?  ?Essential hypertension: Slightly elevated.  Resume hydrochlorothiazide and placed on as needed hydralazine. ?  ?Hypothyroidism: Resume levothyroxine. ?  ? ?DVT prophylaxis: SCDs Start: 01/09/22 1728 ?  Code Status: Full Code  ?Family Communication:  None present at bedside.  Plan of care discussed with patient in length and he/she verbalized understanding and agreed with it. ? ?Status is: Observation ?The patient will require care spanning > 2 midnights and should be moved to inpatient because: Still symptomatic. ? ? ?Estimated body mass index is 19.97 kg/m? as calculated from the following: ?  Height as of this encounter: '5\' 5"'$  (1.651 m). ?  Weight as of this encounter: 54.4 kg. ? ?  ?Nutritional Assessment: ?Body mass index is 19.97 kg/m?Marland KitchenMarland Kitchen ?Seen by dietician.  I agree with the assessment and plan as outlined below: ?Nutrition Status: ?  ?  ?  ? ?. ?Skin Assessment: ?I have examined the patient's skin and I agree with the wound assessment as performed by the wound care RN as outlined below: ?  ? ?Consultants:  ?None ? ?Procedures:  ?None ? ?Antimicrobials:  ?Anti-infectives (From admission, onward)  ? ? None  ? ?  ?  ? ? ?  Subjective: ? ?Seen and examined.  Still with nausea given with clear liquid diet. ? ?Objective: ?Vitals:  ? 01/11/22 0054 01/11/22 0148 01/11/22 0500 01/11/22 0941  ?BP: (!) 157/79 (!) 160/76  (!) 157/83  ?Pulse: (!) 107 (!) 107  85  ?Resp: '18 18  16  '$ ?Temp: 98.8 ?F (37.1 ?C) 98.6 ?F (37  ?C)    ?TempSrc: Oral Oral    ?SpO2: 97% 97% 100% 98%  ?Weight:      ?Height:      ? ? ?Intake/Output Summary (Last 24 hours) at 01/11/2022 1320 ?Last data filed at 01/11/2022 0606 ?Gross per 24 hour  ?Intake 875.25 ml  ?Output --  ?Net 875.25 ml  ? ? ?Filed Weights  ? 01/09/22 1757  ?Weight: 54.4 kg  ? ? ?Examination: ? ?General exam: Appears calm and comfortable  ?Respiratory system: Clear to auscultation. Respiratory effort normal. ?Cardiovascular system: S1 & S2 heard, RRR. No JVD, murmurs, rubs, gallops or clicks. No pedal edema. ?Gastrointestinal system: Abdomen is nondistended, soft and nontender. No organomegaly or masses felt. Normal bowel sounds heard. ?Central nervous system: Alert and oriented. No focal neurological deficits. ?Extremities: Symmetric 5 x 5 power. ?Skin: No rashes, lesions or ulcers.  ?Psychiatry: Judgement and insight appear normal. Mood & affect appropriate.  ? ?Data Reviewed: I have personally reviewed following labs and imaging studies ? ?CBC: ?Recent Labs  ?Lab 01/05/22 ?0120 01/06/22 ?7494 01/09/22 ?0950 01/10/22 ?4967  ?WBC 15.1* 10.5 9.4 9.8  ?NEUTROABS  --   --  6.9  --   ?HGB 13.6 12.2 14.0 14.3  ?HCT 39.2 36.7 40.2 42.5  ?MCV 87.3 91.5 87.8 89.3  ?PLT 325 243 283 281  ? ? ?Basic Metabolic Panel: ?Recent Labs  ?Lab 01/05/22 ?0120 01/06/22 ?5916 01/09/22 ?0950 01/10/22 ?3846  ?NA 136 138 139 133*  ?K 3.5 4.2 3.4* 3.5  ?CL 99 112* 106 101  ?CO2 22 22 19* 22  ?GLUCOSE 183* 111* 107* 103*  ?BUN '14 12 14 10  '$ ?CREATININE 0.95 0.63 0.81 0.53  ?CALCIUM 10.2 8.9 9.4 9.1  ?MG 1.5* 2.2 1.8  --   ?PHOS  --  2.8  --   --   ? ? ?GFR: ?Estimated Creatinine Clearance: 63.4 mL/min (by C-G formula based on SCr of 0.53 mg/dL). ?Liver Function Tests: ?Recent Labs  ?Lab 01/05/22 ?0120 01/06/22 ?6599 01/09/22 ?0950 01/10/22 ?3570  ?AST 39 29 36 26  ?ALT 35 29 40 31  ?ALKPHOS 86 70 78 80  ?BILITOT 0.8 0.4 0.9 0.7  ?PROT 7.3 6.2* 7.1 6.9  ?ALBUMIN 4.6 4.0 4.3 4.3  ? ? ?Recent Labs  ?Lab 01/05/22 ?0120  01/09/22 ?1779  ?LIPASE 32 37  ? ? ?No results for input(s): AMMONIA in the last 168 hours. ?Coagulation Profile: ?No results for input(s): INR, PROTIME in the last 168 hours. ?Cardiac Enzymes: ?No results for input(s): CKTOTAL, CKMB, CKMBINDEX, TROPONINI in the last 168 hours. ?BNP (last 3 results) ?No results for input(s): PROBNP in the last 8760 hours. ?HbA1C: ?No results for input(s): HGBA1C in the last 72 hours. ?CBG: ?Recent Labs  ?Lab 01/10/22 ?2214  ?GLUCAP 144*  ? ?Lipid Profile: ?No results for input(s): CHOL, HDL, LDLCALC, TRIG, CHOLHDL, LDLDIRECT in the last 72 hours. ?Thyroid Function Tests: ?No results for input(s): TSH, T4TOTAL, FREET4, T3FREE, THYROIDAB in the last 72 hours. ?Anemia Panel: ?No results for input(s): VITAMINB12, FOLATE, FERRITIN, TIBC, IRON, RETICCTPCT in the last 72 hours. ?Sepsis Labs: ?No results for input(s): PROCALCITON, LATICACIDVEN in the last 168  hours. ? ?Recent Results (from the past 240 hour(s))  ?Urine Culture     Status: Abnormal  ? Collection Time: 01/09/22  9:26 AM  ? Specimen: In/Out Cath Urine  ?Result Value Ref Range Status  ? Specimen Description   Final  ?  IN/OUT CATH URINE ?Performed at [facility], [place] [address] [place], [place] [address] ?  ? Special Requests   Final  ?  NONE ?Performed at [facility], [place] [address], [place], [place] [address] ?  ? Culture (A)  Final  ?  <10,000 COLONIES/mL INSIGNIFICANT GROWTH ?Performed at [facility], [place] [address], [place], [place] [address] ?  ? Report Status 01/11/2022 FINAL  Final  ?  ? ?Radiology Studies: ?DG CHEST PORT 1 VIEW ? ?Result Date: 01/11/2022 ?CLINICAL DATA:  Dyspnea EXAM: PORTABLE CHEST 1 VIEW COMPARISON:  01/06/2022 FINDINGS: The heart size and mediastinal contours are within normal limits. Both lungs are clear. The visualized skeletal structures are unremarkable. Partially imaged metallic densities in the inferior cervical spine. IMPRESSION: No active  disease. Electronically Signed   By: Merilyn Baba M.D.   On: 01/11/2022 00:06  ? ?US Abdomen Limited RUQ (LIVER/GB) ? ?Result Date: 01/09/2022 ?CLINICAL DATA:  Right upper quadrant pain gallbladder sludge. EXAM: ULT

## 2022-01-11 NOTE — Consult Note (Signed)
Referring Provider: Dr. Doristine Bosworth ?Primary Care Physician:  Denita Lung, MD ?Primary Gastroenterologist:  Dr. Cristina Gong ? ?Reason for Consultation:  Nausea/Vomiting ? ?HPI: Dorothy Wood is a 61 y.o. female is a pleasant woman with ovarian cancer seen for a consult due to intractable nausea/vomiting. She was recently discharged 2 days ago for the N/V and started having severe vomiting again. Upper quadrant abdominal pain as well. EGD in May 2022 that showed mild erosive esophagitis with negative biopsies. Small hiatal hernia. Feels better today on Compazine. Had a reaction to Reglan. History of GERD on Prilosec 40 mg BID at home.  ? ?Past Medical History:  ?Diagnosis Date  ? Cancer [facility])   ? OVARIAN  ? Colonic polyp   ? Gastritis   ? Hyperlipidemia   ? Hypertension   ? Sjogren's disease ([place])   ? Stroke [facility])   ? Thyroid disease   ? HYPOTHYROIDISM  ? TIA (transient ischemic attack) 04/06/2021  ? ? ?Past Surgical History:  ?Procedure Laterality Date  ? ABDOMINAL HYSTERECTOMY    ? Ovarian cancer  ? BIOPSY  02/04/2021  ? Procedure: BIOPSY;  Surgeon: Ronald Lobo, MD;  Location: [facility];  Service: Endoscopy;;  ? BREAST CYST ASPIRATION    ? CESAREAN SECTION    ? x2  ? COLONOSCOPY  10-06  ? Dr. Earnest Bailey  ? ESOPHAGOGASTRODUODENOSCOPY N/A 02/04/2021  ? Procedure: ESOPHAGOGASTRODUODENOSCOPY (EGD);  Surgeon: Ronald Lobo, MD;  Location: [facility];  Service: Endoscopy;  Laterality: N/A;  ? TONSILLECTOMY    ? ? ?Prior to Admission medications   ?Medication Sig Start Date End Date Taking? Authorizing Provider  ?aspirin EC 81 MG tablet Take 81 mg by mouth every morning. Swallow whole.   Yes [provider]  ?atorvastatin (LIPITOR) 80 MG tablet Take 1 tablet (80 mg total) by mouth daily. 10/09/21 10/09/22 Yes Denita Lung, MD  ?buPROPion (WELLBUTRIN XL) 300 MG 24 hr tablet Take 1 tablet (300 mg total) by mouth every morning. 12/03/21  Yes   ?guanFACINE (TENEX) 2 MG tablet Take 1 tablet by mouth every morning  and 1 tablet at bedtime ?Patient taking differently: Take 2 mg by mouth in the morning and at bedtime. 12/03/21  Yes   ?hydrochlorothiazide (HYDRODIURIL) 25 MG tablet TAKE 1 TABLET BY MOUTH ONCE DAILY ?Patient taking differently: Take 25 mg by mouth daily. 08/26/21 08/26/22 Yes Lelon Perla, MD  ?levothyroxine (EUTHYROX) 88 MCG tablet TAKE 1 TABLET BY MOUTH ONCE A DAY ?Patient taking differently: Take 88 mcg by mouth daily before breakfast. 12/23/21  Yes   ?LORazepam (ATIVAN) 1 MG tablet Take 1 tablet by mouth daily as needed for anxiety (30 day supply) ?Patient taking differently: Take 1 mg by mouth at bedtime as needed for sleep or anxiety. 12/25/21  Yes   ?methylphenidate (RITALIN) 20 MG tablet Take 1 tablet by mouth 3 times daily ?Patient taking differently: Take 20 mg by mouth See admin instructions. Take 20 mg by mouth two to three times a day 07/03/21  Yes   ?mirabegron ER (MYRBETRIQ) 50 MG TB24 tablet Take 1 tablet by mouth daily ?Patient taking differently: Take 50 mg by mouth at bedtime. 12/08/21  Yes   ?omeprazole (PRILOSEC) 40 MG capsule Take 1 capsule (40 mg total) by mouth 2 (two) times daily. 01/07/22 02/06/22 Yes Pahwani, Einar Grad, MD  ?pilocarpine (SALAGEN) 5 MG tablet TAKE 1 TABLET BY MOUTH 4 TIMES DAILY ?Patient taking differently: Take 5 mg by mouth in the morning, at noon, in the evening,  and at bedtime. 12/09/20 01/22/22 Yes Denita Lung, MD  ?polyethylene glycol (MIRALAX / GLYCOLAX) 17 g packet Take 8.5 g by mouth See admin instructions. Mix 8.5 grams of powder into the recommended amount of liquid and drink every morning   Yes [provider]  ?prochlorperazine (COMPAZINE) 5 MG tablet Take 1 tablet (5 mg total) by mouth every 6 (six) hours as needed for nausea or vomiting. 01/07/22  Yes Darliss Cheney, MD  ?sertraline (ZOLOFT) 50 MG tablet Take 1/2 tablet by mouth every morning for 1 week then take 1 tablet every morning ?Patient taking differently: Take 50 mg by mouth daily. 12/03/21  Yes    ?estradiol (ESTRACE) 0.5 MG tablet TAKE 1 TABLET BY MOUTH ONCE A DAY ?Patient not taking: No sig reported 06/03/20 04/07/21  Vania Rea, MD  ? ? ?Scheduled Meds: ? aspirin EC  81 mg Oral q morning  ? buPROPion  300 mg Oral q morning  ? hydrochlorothiazide  25 mg Oral Daily  ? levothyroxine  88 mcg Oral QAC breakfast  ? polyethylene glycol  17 g Oral Daily  ? ?Continuous Infusions: ? famotidine (PEPCID) IV 20 mg (01/11/22 0944)  ? lactated ringers with kcl 75 mL/hr at 01/11/22 0606  ? ?PRN Meds:.diphenhydrAMINE, hydrALAZINE, methylphenidate, prochlorperazine ? ?Allergies as of 01/09/2022  ? (No Known Allergies)  ? ? ?Family History  ?Problem Relation Age of Onset  ? Mental illness Mother   ? Hypertension Mother   ? Heart failure Mother   ? CAD Father   ?     CABG at age 52  ? Mental illness Sister   ? Alcoholism Sister   ? Mental illness Brother   ? Alcoholism Brother   ? Mental illness Maternal Uncle   ? Mental illness Maternal Grandmother   ? Cancer Paternal Grandmother   ? ? ?Social History  ? ?Socioeconomic History  ? Marital status: Married  ?  Spouse name: Not on file  ? Number of children: 2  ? Years of education: Not on file  ? Highest education level: Not on file  ?Occupational History  ? Not on file  ?Tobacco Use  ? Smoking status: Never  ? Smokeless tobacco: Never  ?Vaping Use  ? Vaping Use: Never used  ?Substance and Sexual Activity  ? Alcohol use: Yes  ?  Alcohol/week: 4.0 standard drinks  ?  Types: 4 Glasses of wine per week  ?  Comment: 05/21/21 1 glasses wine 3 x week  ? Drug use: No  ? Sexual activity: Not Currently  ?Other Topics Concern  ? Not on file  ?Social History Narrative  ? Lives with spouse  ? ?Social Determinants of Health  ? ?Financial Resource Strain: Not on file  ?Food Insecurity: Not on file  ?Transportation Needs: Not on file  ?Physical Activity: Not on file  ?Stress: Not on file  ?Social Connections: Not on file  ?Intimate Partner Violence: Not on file  ? ? ?Review of Systems: All  negative except as stated above in HPI. ? ?Physical Exam: ?Vital signs: ?Vitals:  ? 01/11/22 0941 01/11/22 1349  ?BP: (!) 157/83 (!) 155/75  ?Pulse: 85 88  ?Resp: 16 16  ?Temp:  98.4 ?F (36.9 ?C)  ?SpO2: 98% 99%  ? ?Last BM Date : 01/09/22 ?General:  Lethargic, thin, no acute distress, pleasant  ?Head: normocephalic, atraumatic ?Eyes: anicteric sclera ?ENT: oropharynx clear ?Neck: supple, nontender ?Lungs:  Clear throughout to auscultation.   No wheezes, crackles, or rhonchi. No acute  distress. ?Heart:  Regular rate and rhythm; no murmurs, clicks, rubs,  or gallops. ?Abdomen: soft, nontender, nondistended, +BS ?Rectal:  Deferred ?Ext: no edema ? ?GI:  ?Lab Results: ?Recent Labs  ?  01/09/22 ?0950 01/10/22 ?4327  ?WBC 9.4 9.8  ?HGB 14.0 14.3  ?HCT 40.2 42.5  ?PLT 283 281  ? ?BMET ?Recent Labs  ?  01/09/22 ?0950 01/10/22 ?6147  ?NA 139 133*  ?K 3.4* 3.5  ?CL 106 101  ?CO2 19* 22  ?GLUCOSE 107* 103*  ?BUN 14 10  ?CREATININE 0.81 0.53  ?CALCIUM 9.4 9.1  ? ?LFT ?Recent Labs  ?  01/10/22 ?0929  ?PROT 6.9  ?ALBUMIN 4.3  ?AST 26  ?ALT 31  ?ALKPHOS 80  ?BILITOT 0.7  ? ?PT/INR ?No results for input(s): LABPROT, INR in the last 72 hours. ? ? ?Studies/Results: ?DG CHEST PORT 1 VIEW ? ?Result Date: 01/11/2022 ?CLINICAL DATA:  Dyspnea EXAM: PORTABLE CHEST 1 VIEW COMPARISON:  01/06/2022 FINDINGS: The heart size and mediastinal contours are within normal limits. Both lungs are clear. The visualized skeletal structures are unremarkable. Partially imaged metallic densities in the inferior cervical spine. IMPRESSION: No active disease. Electronically Signed   By: Merilyn Baba M.D.   On: 01/11/2022 00:06   ? ?Impression/Plan: ?Intractable nausea/vomiting improving with Compazine and Pepcid. Change to IV PPI Q 12 hours. Supportive care. Clear liquid diet and advance as tolerated tomorrow. Hold off on EGD at this time. Will follow. ? ? ? LOS: 1 day  ? ?Lear Ng  01/11/2022, 5:19 PM ? ?Questions please call [phone]  ? ?

## 2022-01-11 NOTE — TOC Initial Note (Signed)
Transition of Care (TOC) - Initial/Assessment Note  ? ? ?Patient Details  ?Name: Dorothy Wood ?MRN: [id number] ?Date of Birth: November 26, 1960 ? ?Transition of Care (TOC) CM/SW Contact:    ?Tawanna Cooler, RN ?Phone Number: ?01/11/2022, 1:16 PM ? ?Clinical Narrative:                 ? ? ?Transition of Care Department (TOC) has reviewed patient and no TOC needs have been identified at this time. We will continue to monitor patient advancement through interdisciplinary progression rounds. If new patient transition needs arise, please place a TOC consult. ? ? ?Expected Discharge Plan: Home/Self Care ?Barriers to Discharge: Continued Medical Work up ? ? ? ?Expected Discharge Plan and Services ?Expected Discharge Plan: Home/Self Care ?  ?  ?  ?Living arrangements for the past 2 months: [place] ?                ?  ?Prior Living Arrangements/Services ?Living arrangements for the past 2 months: [place] ?Lives with:: Spouse ?Patient language and need for interpreter reviewed:: Yes ?       ?Need for Family Participation in Patient Care: Yes (Comment) ?Care giver support system in place?: Yes (comment) ?  ?Criminal Activity/Legal Involvement Pertinent to Current Situation/Hospitalization: No - Comment as needed ? ?Activities of Daily Living ?Home Assistive Devices/Equipment: Eyeglasses ?ADL Screening (condition at time of admission) ?Patient's cognitive ability adequate to safely complete daily activities?: Yes ?Is the patient deaf or have difficulty hearing?: No ?Does the patient have difficulty seeing, even when wearing glasses/contacts?: No ?Does the patient have difficulty concentrating, remembering, or making decisions?: No ?Patient able to express need for assistance with ADLs?: Yes ?Does the patient have difficulty dressing or bathing?: No ?Independently performs ADLs?: Yes (appropriate for developmental age) ?Does the patient have difficulty walking or climbing stairs?: No ?Weakness of Legs:  Both ?Weakness of Arms/Hands: Both ? ?Emotional Assessment ? Orientation: : Oriented to Self, Oriented to Place, Oriented to  Time, Oriented to Situation ?Alcohol / Substance Use: Not Applicable ?Psych Involvement: No (comment) ? ?Admission diagnosis:  Intractable nausea and vomiting [R11.2] ?Nausea and vomiting, unspecified vomiting type [R11.2] ?Patient Active Problem List  ? Diagnosis Date Noted  ? History of TIA (transient ischemic attack) 01/09/2022  ? GERD (gastroesophageal reflux disease) 01/07/2022  ? Cholelithiasis without cholecystitis 01/07/2022  ? Pyuria 01/06/2022  ? Cervical spondylosis without myelopathy 01/05/2022  ? Constipation by delayed colonic transit 01/05/2022  ? Cervical radiculopathy 01/05/2022  ? Oropharyngeal dysphagia 01/05/2022  ? Degeneration of lumbar intervertebral disc 01/05/2022  ? Hypomagnesemia 01/05/2022  ? Glucose intolerance (impaired glucose tolerance) 01/05/2022  ? Hyperlipidemia, unspecified 12/06/2021  ? Major depressive disorder, single episode, unspecified 12/06/2021  ? Right arm weakness   ? Paresthesia   ? Gastritis 02/04/2021  ? Intractable nausea and vomiting 02/02/2021  ? Prolonged QT interval 02/02/2021  ? High anion gap metabolic acidosis 19/41/7408  ? OSA (obstructive sleep apnea) 09/10/2020  ? TIA (transient ischemic attack) 04/29/2020  ? Essential hypertension 04/29/2020  ? Hormone replacement therapy (HRT) 09/09/2016  ? Hypothyroidism 07/16/2014  ? Sjogren's syndrome ([place]) 04/11/2013  ? History of ovarian cancer 04/11/2013  ? Personal history of colonic polyps 04/11/2013  ? ADD (attention deficit disorder) 07/06/2012  ? Dysthymia 07/06/2012  ? ?PCP:  Denita Lung, MD ?Pharmacy:   ?[facility] ?[address] ?[place] [address] ?Phone: [phone] Fax: [phone] ? ? ? ?

## 2022-01-12 DIAGNOSIS — R112 Nausea with vomiting, unspecified: Secondary | ICD-10-CM | POA: Diagnosis not present

## 2022-01-12 MED ORDER — AMLODIPINE BESYLATE 5 MG PO TABS
5.0000 mg | ORAL_TABLET | Freq: Every day | ORAL | Status: DC
  Administered 2022-01-12 – 2022-01-13 (×2): 5 mg via ORAL
  Filled 2022-01-12 (×2): qty 1

## 2022-01-12 NOTE — Progress Notes (Addendum)
[facility] Progress Note ? ?Dorothy Wood 61 y.o. Aug 25, 1961 ? ?CC: Nausea, vomiting ? ? ?Subjective: ?Patient seen and examined laying comfortably in bed.  Notes great improvement in nausea and vomiting with Compazine.  No episodes of vomiting last night.  Denies abdominal pain.  Tolerating clear diet well.  No bowel movements. ? ?ROS : Review of Systems  ?Gastrointestinal:  Positive for nausea. Negative for abdominal pain, blood in stool, constipation, diarrhea, heartburn, melena and vomiting.  ?Genitourinary:  Negative for dysuria and urgency.  ? ? ? ?Objective: ?Vital signs in last 24 hours: ?Vitals:  ? 01/11/22 2223 01/12/22 0534  ?BP: (!) 131/56 (!) 154/76  ?Pulse:  (!) 109  ?Resp:  16  ?Temp:  98.5 ?F (36.9 ?C)  ?SpO2:  97%  ? ? ?Physical Exam: ? ?General:  Alert, cooperative, no distress, appears stated age  ?Head:  Normocephalic, without obvious abnormality, atraumatic  ?Eyes:  Anicteric sclera, EOM's intact  ?Lungs:   Clear to auscultation bilaterally, respirations unlabored  ?Heart:  Regular rate and rhythm, S1, S2 normal  ?Abdomen:   Soft, non-tender, bowel sounds active all four quadrants,  no masses,   ?Extremities: Extremities normal, atraumatic, no  edema  ?Pulses: 2+ and symmetric  ? ? ?Lab Results: ?Recent Labs  ?  01/10/22 ?7867  ?NA 133*  ?K 3.5  ?CL 101  ?CO2 22  ?GLUCOSE 103*  ?BUN 10  ?CREATININE 0.53  ?CALCIUM 9.1  ? ?Recent Labs  ?  01/10/22 ?5449  ?AST 26  ?ALT 31  ?ALKPHOS 80  ?BILITOT 0.7  ?PROT 6.9  ?ALBUMIN 4.3  ? ?Recent Labs  ?  01/10/22 ?2010  ?WBC 9.8  ?HGB 14.3  ?HCT 42.5  ?MCV 89.3  ?PLT 281  ? ?No results for input(s): LABPROT, INR in the last 72 hours. ? ? ? ?Assessment ?Intractable nausea/vomiting improving with Compazine and Pepcid.  ? ?Patient with QTc prolongation limiting antiemetic medications.  Patient with history of Reglan allergic reaction. ? ?Possible her symptoms secondary to GERD.  Would benefit from continuing outpatient PPI. ? ?Mild hyponatremia at  133 otherwise normal labs. ? ?Plan: ?Continue supportive care ?Continue Compazine 10 mg IV every 6 hours as needed ?Continue pantoprazole 40 mg IV twice daily.  Would benefit from continuing pantoprazole outpatient. ?Continue full liquid diet.  If tolerating well consider increasing to solid as tolerated. ?No plan for EGD at this time. ?[facility] will sign off. ? ?Charlott Rakes PA-C ?01/12/2022, 10:16 AM ? ?Contact #  [phone] ? ?

## 2022-01-12 NOTE — Progress Notes (Addendum)
?PROGRESS NOTE ? ? ? ?Dorothy Wood  OAC:[id number] DOB: 1960-10-28 DOA: 01/09/2022 ?PCP: Denita Lung, MD ? ? ?Brief Narrative:  ?HPI: Dorothy Wood is a 60 y.o. female with medical history significant of ovarian cancer, gastritis, HTN, sjogrens, TIA, hypothyroidism. Presenting with intractable N/V. She was recently admitted for same and discharged 2 days ago. Yesterday she says she was doing well. She was able to tolerate juice, gatorade, and chicken soup. However, this morning, she started vomiting again. She says it was bilious. No hematemesis. She tried anti-emetics, but they didn't help. She didn't have any fever or diarrhea. She also notes that this morning she had urinary burning and increased frequency. When her symptoms worsened this morning, she decided to come back to the ED for assistance. She denies any other aggravating or alleviating factors.   ?  ? ?Assessment & Plan: ?  ?Principal Problem: ?  Intractable nausea and vomiting ?Active Problems: ?  Prolonged QT interval ?  Sjogren's syndrome ([place]) ?  Hypothyroidism ?  Essential hypertension ?  Hyperlipidemia, unspecified ?  Dysthymia ?  GERD (gastroesophageal reflux disease) ?  History of TIA (transient ischemic attack) ? ?Intractable nausea and vomiting secondary to severe GERD: This is her fourth admission.  She claims that she was compliant with her medications and dietary modifications that I had advised her during recent hospitalization.  Unfortunately, due to having QTc prolonged admission, we are very limited with over antiemetic medications.  Her symptoms were persistent despite of being on Compazine, for that reason, Reglan was started but patient had allergic reaction to that.  Reglan was stopped.  She remains on Compazine.  She states that her symptoms are slightly better.  She tells me that she was seen by GI again today and they have told her that there is no plan for EGD at and that her symptoms are secondary to GERD, which I agree  with.  We will continue twice daily Protonix.  I will advance her to full liquid diet.  She is agreeable with that. ? ?History of cholelithiasis: Recommended outpatient follow-up with general surgery for elective cholecystectomy. ? ?Prolonged QT interval: Monitor closely and avoid QTc prolonging medications. ? ?Sjogren's syndrome ([place]) ?Pilocarpine ?Follow with rheum ?  ?Essential hypertension: Slightly elevated.  Continue hydrochlorothiazide. ?Add Amlodipine 5 mg daily. ?  ?Hypothyroidism: Continue levothyroxine. ?  ?DVT prophylaxis: SCDs Start: 01/09/22 1728 ?  Code Status: Full Code  ?Family Communication:  None present at bedside.  Plan of care discussed with patient in length and he/she verbalized understanding and agreed with it. ? ?Status is: Inpatient ?Remains inpatient appropriate because: Still symptomatic but improving slowly. ? ?Estimated body mass index is 19.97 kg/m? as calculated from the following: ?  Height as of this encounter: '5\' 5"'$  (1.651 m). ?  Weight as of this encounter: 54.4 kg. ? ?  ?Nutritional Assessment: ?Body mass index is 19.97 kg/m?Marland KitchenMarland Kitchen ?Seen by dietician.  I agree with the assessment and plan as outlined below: ?Nutrition Status: ? ?. ?Skin Assessment: ?I have examined the patient's skin and I agree with the wound assessment as performed by the wound care RN as outlined below: ?  ? ?Consultants:  ?None ? ?Procedures:  ?None ? ?Antimicrobials:  ?Anti-infectives (From admission, onward)  ? ? None  ? ?  ?  ? ? ?Subjective: ? ?Seen and examined.  Nausea is improving.  No new complaint. ? ?Objective: ?Vitals:  ? 01/11/22 2005 01/11/22 2151 01/11/22 2223 01/12/22 0534  ?BP: Marland Kitchen)  170/89 (!) 167/91 (!) 131/56 (!) 154/76  ?Pulse: 89 95  (!) 109  ?Resp: 18   16  ?Temp: 98.6 ?F (37 ?C)   98.5 ?F (36.9 ?C)  ?TempSrc: Oral   Oral  ?SpO2: 99%   97%  ?Weight:      ?Height:      ? ? ?Intake/Output Summary (Last 24 hours) at 01/12/2022 1009 ?Last data filed at 01/12/2022 0049 ?Gross per 24 hour  ?Intake  1812.63 ml  ?Output --  ?Net 1812.63 ml  ? ? ?Filed Weights  ? 01/09/22 1757  ?Weight: 54.4 kg  ? ? ?Examination: ? ?General exam: Appears calm and comfortable  ?Respiratory system: Clear to auscultation. Respiratory effort normal. ?Cardiovascular system: S1 & S2 heard, RRR. No JVD, murmurs, rubs, gallops or clicks. No pedal edema. ?Gastrointestinal system: Abdomen is nondistended, soft and nontender. No organomegaly or masses felt. Normal bowel sounds heard. ?Central nervous system: Alert and oriented. No focal neurological deficits. ?Extremities: Symmetric 5 x 5 power. ?Skin: No rashes, lesions or ulcers.  ?Psychiatry: Judgement and insight appear normal. Mood & affect appropriate.  ? ?Data Reviewed: I have personally reviewed following labs and imaging studies ? ?CBC: ?Recent Labs  ?Lab 01/06/22 ?9604 01/09/22 ?0950 01/10/22 ?5409  ?WBC 10.5 9.4 9.8  ?NEUTROABS  --  6.9  --   ?HGB 12.2 14.0 14.3  ?HCT 36.7 40.2 42.5  ?MCV 91.5 87.8 89.3  ?PLT 243 283 281  ? ? ?Basic Metabolic Panel: ?Recent Labs  ?Lab 01/06/22 ?8119 01/09/22 ?0950 01/10/22 ?1478  ?NA 138 139 133*  ?K 4.2 3.4* 3.5  ?CL 112* 106 101  ?CO2 22 19* 22  ?GLUCOSE 111* 107* 103*  ?BUN '12 14 10  '$ ?CREATININE 0.63 0.81 0.53  ?CALCIUM 8.9 9.4 9.1  ?MG 2.2 1.8  --   ?PHOS 2.8  --   --   ? ? ?GFR: ?Estimated Creatinine Clearance: 63.4 mL/min (by C-G formula based on SCr of 0.53 mg/dL). ?Liver Function Tests: ?Recent Labs  ?Lab 01/06/22 ?0609 01/09/22 ?0950 01/10/22 ?2956  ?AST 29 36 26  ?ALT 29 40 31  ?ALKPHOS 70 78 80  ?BILITOT 0.4 0.9 0.7  ?PROT 6.2* 7.1 6.9  ?ALBUMIN 4.0 4.3 4.3  ? ? ?Recent Labs  ?Lab 01/09/22 ?0950  ?LIPASE 37  ? ? ?No results for input(s): AMMONIA in the last 168 hours. ?Coagulation Profile: ?No results for input(s): INR, PROTIME in the last 168 hours. ?Cardiac Enzymes: ?No results for input(s): CKTOTAL, CKMB, CKMBINDEX, TROPONINI in the last 168 hours. ?BNP (last 3 results) ?No results for input(s): PROBNP in the last 8760  hours. ?HbA1C: ?No results for input(s): HGBA1C in the last 72 hours. ?CBG: ?Recent Labs  ?Lab 01/10/22 ?2214  ?GLUCAP 144*  ? ? ?Lipid Profile: ?No results for input(s): CHOL, HDL, LDLCALC, TRIG, CHOLHDL, LDLDIRECT in the last 72 hours. ?Thyroid Function Tests: ?No results for input(s): TSH, T4TOTAL, FREET4, T3FREE, THYROIDAB in the last 72 hours. ?Anemia Panel: ?No results for input(s): VITAMINB12, FOLATE, FERRITIN, TIBC, IRON, RETICCTPCT in the last 72 hours. ?Sepsis Labs: ?No results for input(s): PROCALCITON, LATICACIDVEN in the last 168 hours. ? ?Recent Results (from the past 240 hour(s))  ?Urine Culture     Status: Abnormal  ? Collection Time: 01/09/22  9:26 AM  ? Specimen: In/Out Cath Urine  ?Result Value Ref Range Status  ? Specimen Description   Final  ?  IN/OUT CATH URINE ?Performed at [facility], [place] [address] [place], [place] [address] ?  ?  Special Requests   Final  ?  NONE ?Performed at [facility], [place] [address] [place], [place] [address] ?  ? Culture (A)  Final  ?  <10,000 COLONIES/mL INSIGNIFICANT GROWTH ?Performed at [facility], [place] [address] [place], [place] [address] ?  ? Report Status 01/11/2022 FINAL  Final  ?  ? ?Radiology Studies: ?DG CHEST PORT 1 VIEW ? ?Result Date: 01/11/2022 ?CLINICAL DATA:  Dyspnea EXAM: PORTABLE CHEST 1 VIEW COMPARISON:  01/06/2022 FINDINGS: The heart size and mediastinal contours are within normal limits. Both lungs are clear. The visualized skeletal structures are unremarkable. Partially imaged metallic densities in the inferior cervical spine. IMPRESSION: No active disease. Electronically Signed   By: Merilyn Baba M.D.   On: 01/11/2022 00:06   ? ?Scheduled Meds: ? aspirin EC  81 mg Oral q morning  ? buPROPion  300 mg Oral q morning  ? hydrochlorothiazide  25 mg Oral Daily  ? levothyroxine  88 mcg Oral QAC breakfast  ? pantoprazole (PROTONIX) IV  40 mg Intravenous Q12H  ? polyethylene glycol  17 g Oral Daily   ? ?Continuous Infusions: ? lactated ringers with kcl 75 mL/hr at 01/12/22 0218  ? ? ? LOS: 2 days  ? ?Time spent: 25 minutes ? ?Darliss Cheney, MD ?[facility] ? ?01/12/2022, 10:09 AM  ? ?*Please note that this is a ve

## 2022-01-13 ENCOUNTER — Other Ambulatory Visit (HOSPITAL_COMMUNITY): Payer: Self-pay

## 2022-01-13 ENCOUNTER — Telehealth: Payer: Self-pay | Admitting: *Deleted

## 2022-01-13 MED ORDER — AMLODIPINE BESYLATE 5 MG PO TABS
5.0000 mg | ORAL_TABLET | Freq: Every day | ORAL | 0 refills | Status: DC
  Filled 2022-01-13: qty 30, 30d supply, fill #0

## 2022-01-13 MED ORDER — PROCHLORPERAZINE MALEATE 5 MG PO TABS
5.0000 mg | ORAL_TABLET | Freq: Four times a day (QID) | ORAL | 0 refills | Status: DC | PRN
  Filled 2022-01-13: qty 30, 8d supply, fill #0

## 2022-01-13 NOTE — Chronic Care Management (AMB) (Signed)
?  Care Coordination ?Note ? ?01/13/2022 ?Name: Dorothy Wood MRN: [id number] DOB: Mar 15, 1961 ? ?Dorothy Wood is a 61 y.o. year old female who is a primary care patient of Denita Lung, MD and is actively engaged with the care management team. I reached out to [place] Cellar by phone today to assist with re-scheduling an initial visit with the BSW ? ?Follow up plan: ?Unsuccessful telephone outreach attempt made. A HIPAA compliant phone message was left for the patient providing contact information and requesting a return call. The care management team will reach out to the patient again over the next 1 day. If patient returns call to provider office, please advise to call [address] at [phone]. ? ?Laverda Sorenson  ?Care Guide, Embedded Care Coordination ?[place]  Care Management  ?Direct Dial: [phone] ? ?

## 2022-01-13 NOTE — Discharge Summary (Signed)
PatientPhysician Discharge Summary  ?Dorothy Wood:[id number] DOB: [birth year] DOA: 01/09/2022 ? ?PCP: Denita Lung, MD ? ?Admit date: 01/09/2022 ?Discharge date: 01/13/2022 ?30 Day Unplanned Readmission Risk Score   ? ?Flowsheet Row ED to Hosp-Admission (Current) from 01/09/2022 in [place]  ?30 Day Unplanned Readmission Risk Score (%) 13.65 Filed at 01/13/2022 0801  ? ?  ? ? This score is the patient's risk of an unplanned readmission within 30 days of being discharged (0 -100%). The score is based on dignosis, age, lab data, medications, orders, and past utilization.   ?Low:  0-14.9   Medium: 15-21.9   High: 22-29.9   Extreme: 30 and above ? ?  ? ?  ? ? ? ?Admitted From: Home ?Disposition: Home ? ?Recommendations for Outpatient Follow-up:  ?Follow up with PCP in 1-2 weeks ?Please obtain BMP/CBC in one week ?Follow-up with GI if desired ?Please follow up with your PCP on the following pending results: ?Unresulted Labs (From admission, onward)  ? ? None  ? ?  ?  ? ? ?Home Health: None ?Equipment/Devices: None ? ?Discharge Condition: Stable ?CODE STATUS: Full code ?Diet recommendation: Cardiac ? ?Subjective: Seen and examined.  She states that she feels better.  Nausea is controlled.  She wants to advance her diet to soft but she is confident that she can go home today.  She was offered another day of hospitalization to see how she does with a soft diet but she requested discharge today. ? ?Brief/Interim Summary:  Dorothy Wood is a 61 y.o. female with medical history significant of ovarian cancer, gastritis, HTN, sjogrens, TIA, hypothyroidism. Presented with intractable N/V. She was recently admitted for same and discharged 2 days ago.  Initially she was feeling well but then she started having the similar symptoms so she came to the emergency department.  This is her fourth hospitalization.  Detailed hospitalization as below. ?  ?Intractable nausea and vomiting secondary to severe GERD: Once  again she was started on clear liquid diet as well as twice daily Protonix.  When she was discharged 2 days ago, she was presumed to have her symptoms secondary to severe GERD as indicated by all the work-up done in the past.  She requested GI consultation.  She was seen by GI and they also agreed that her symptoms are secondary to GERD and did not recommend any EGD.  Patient's diet was advanced and her symptoms are better now.  She wants to advance her diet to soft but also wants to be discharged home today.  She was prescribed Protonix twice daily during last hospitalization.  I am also prescribing her as needed Compazine due to QT prolongation. ?  ?History of cholelithiasis: Recommended outpatient follow-up with general surgery for elective cholecystectomy. ?  ?Prolonged QT interval: Monitor closely and avoid QTc prolonging medications. ?  ?Sjogren's syndrome ([place]) ?Pilocarpine ?Follow with rheum ?  ?Essential hypertension: Now controlled.  Continue hydrochlorothiazide. ?Added amlodipine 5 mg daily during this hospitalization. ?  ?Hypothyroidism: Continue levothyroxine. ? ?Discharge plan was discussed with patient and/or family member and they verbalized understanding and agreed with it.  ?Discharge Diagnoses:  ?Principal Problem: ?  Intractable nausea and vomiting ?Active Problems: ?  Prolonged QT interval ?  Sjogren's syndrome ([place]) ?  Hypothyroidism ?  Essential hypertension ?  Hyperlipidemia, unspecified ?  Dysthymia ?  GERD (gastroesophageal reflux disease) ?  History of TIA (transient ischemic attack) ? ? ? ?Discharge Instructions ? ? ?Allergies as of 01/13/2022   ? ?  Reactions  ? Reglan [metoclopramide] Shortness Of Breath  ? ?  ? ?  ?Medication List  ?  ? ?TAKE these medications   ? ?amLODipine 5 MG tablet ?Commonly known as: NORVASC ?Take 1 tablet (5 mg total) by mouth daily. ?  ?aspirin EC 81 MG tablet ?Take 81 mg by mouth every morning. Swallow whole. ?  ?atorvastatin 80 MG tablet ?Commonly known as:  LIPITOR ?Take 1 tablet (80 mg total) by mouth daily. ?  ?buPROPion 300 MG 24 hr tablet ?Commonly known as: Wellbutrin XL ?Take 1 tablet (300 mg total) by mouth every morning. ?  ?guanFACINE 2 MG tablet ?Commonly known as: TENEX ?Take 1 tablet by mouth every morning and 1 tablet at bedtime ?What changed:  ?how much to take ?how to take this ?when to take this ?  ?hydrochlorothiazide 25 MG tablet ?Commonly known as: HYDRODIURIL ?TAKE 1 TABLET BY MOUTH ONCE DAILY ?What changed: how much to take ?  ?levothyroxine 88 MCG tablet ?Commonly known as: Euthyrox ?TAKE 1 TABLET BY MOUTH ONCE A DAY ?What changed: when to take this ?  ?LORazepam 1 MG tablet ?Commonly known as: Ativan ?Take 1 tablet by mouth daily as needed for anxiety (30 day supply) ?What changed:  ?how much to take ?how to take this ?when to take this ?reasons to take this ?  ?methylphenidate 20 MG tablet ?Commonly known as: Ritalin ?Take 1 tablet by mouth 3 times daily ?What changed:  ?how much to take ?how to take this ?when to take this ?additional instructions ?  ?Myrbetriq 50 MG Tb24 tablet ?Generic drug: mirabegron ER ?Take 1 tablet by mouth daily ?What changed:  ?how much to take ?when to take this ?  ?omeprazole 40 MG capsule ?Commonly known as: PRILOSEC ?Take 1 capsule (40 mg total) by mouth 2 (two) times daily. ?  ?pilocarpine 5 MG tablet ?Commonly known as: SALAGEN ?TAKE 1 TABLET BY MOUTH 4 TIMES DAILY ?What changed:  ?how much to take ?when to take this ?  ?polyethylene glycol 17 g packet ?Commonly known as: MIRALAX / GLYCOLAX ?Take 8.5 g by mouth See admin instructions. Mix 8.5 grams of powder into the recommended amount of liquid and drink every morning ?  ?prochlorperazine 5 MG tablet ?Commonly known as: COMPAZINE ?Take 1 tablet (5 mg total) by mouth every 6 (six) hours as needed for nausea or vomiting. ?  ?sertraline 50 MG tablet ?Commonly known as: Zoloft ?Take 1/2 tablet by mouth every morning for 1 week then take 1 tablet every morning ?What  changed:  ?how much to take ?how to take this ?when to take this ?  ? ?  ? ? Follow-up Information   ? ? Denita Lung, MD Follow up in 1 week(s).   ?Specialty: Family Medicine ?Contact information: ?[place] ?[place] [id number] ?[phone] ? ? ?  ?  ? ?  ?  ? ?  ? ?Allergies  ?Allergen Reactions  ? Reglan [Metoclopramide] Shortness Of Breath  ? ? ?Consultations: GI ? ? ?Procedures/Studies: ?CT Head Wo Contrast ? ?Result Date: 01/05/2022 ?CLINICAL DATA:  Delirium. EXAM: CT HEAD WITHOUT CONTRAST TECHNIQUE: Contiguous axial images were obtained from the base of the skull through the vertex without intravenous contrast. RADIATION DOSE REDUCTION: This exam was performed according to the departmental dose-optimization program which includes automated exposure control, adjustment of the mA and/or kV according to patient size and/or use of iterative reconstruction technique. COMPARISON:  04/06/2021 FINDINGS: Brain: There is no evidence for acute hemorrhage,  hydrocephalus, mass lesion, or abnormal extra-axial fluid collection. No definite CT evidence for acute infarction. Vascular: No hyperdense vessel or unexpected calcification. Skull: No evidence for fracture. No worrisome lytic or sclerotic lesion. Sinuses/Orbits: The visualized paranasal sinuses and mastoid air cells are clear. Visualized portions of the globes and intraorbital fat are unremarkable. Other: None. IMPRESSION: No acute intracranial abnormality. Electronically Signed   By: Misty Stanley M.D.   On: 01/05/2022 05:45  ? ?CT ABDOMEN PELVIS W CONTRAST ? ?Result Date: 01/06/2022 ?CLINICAL DATA:  Nausea and vomiting. EXAM: CT ABDOMEN AND PELVIS WITH CONTRAST TECHNIQUE: Multidetector CT imaging of the abdomen and pelvis was performed using the standard protocol following bolus administration of intravenous contrast. RADIATION DOSE REDUCTION: This exam was performed according to the departmental dose-optimization program which includes automated  exposure control, adjustment of the mA and/or kV according to patient size and/or use of iterative reconstruction technique. CONTRAST:  134m OMNIPAQUE IOHEXOL 300 MG/ML  SOLN COMPARISON:  Noncontrast

## 2022-01-13 NOTE — Progress Notes (Signed)
Pt discharged to home. DC instructions given. No concerns voiced.  Left unit in wheelchair pushed by nurse tech. Left in stable condition.  ?

## 2022-01-14 ENCOUNTER — Other Ambulatory Visit: Payer: Self-pay | Admitting: Surgery

## 2022-01-14 ENCOUNTER — Other Ambulatory Visit (HOSPITAL_COMMUNITY): Payer: Self-pay

## 2022-01-14 ENCOUNTER — Other Ambulatory Visit (HOSPITAL_COMMUNITY): Payer: Self-pay | Admitting: Surgery

## 2022-01-14 ENCOUNTER — Other Ambulatory Visit: Payer: Self-pay

## 2022-01-14 DIAGNOSIS — R1114 Bilious vomiting: Secondary | ICD-10-CM

## 2022-01-14 DIAGNOSIS — K828 Other specified diseases of gallbladder: Secondary | ICD-10-CM

## 2022-01-14 DIAGNOSIS — R1013 Epigastric pain: Secondary | ICD-10-CM | POA: Diagnosis not present

## 2022-01-14 NOTE — Patient Outreach (Signed)
[facility]) Care Management ? ?01/14/2022 ? ?KIMANH TEMPLEMAN ?1961/05/06 ?[id number] ? ?[place] Patient: Dorothy Wood ? ?Primary Care Provider:  Denita Lung, MD, [place], is an embedded provider with a Chronic Care Management team and program, and is listed for the transition of care follow up and appointments. ? ?Patient was screened for readmission review for less than 7 days noted. Patient had been referred to Embedded practice service for follow up needs for chronic care management from her previous admission. Request for review of readmission from Quality team. Review sent. ? ?Wood: Patient is to be followed by the [place] Management  team needs. ? ?Please contact for further questions, ? ?Natividad Brood, RN BSN CCM ?[facility] Liaison ? [phone] business mobile phone ?Toll free office [phone]  ?Fax number: [phone] ?[email or web address] ? ? ? ? ? ?

## 2022-01-14 NOTE — Chronic Care Management (AMB) (Signed)
?  Care Coordination ?Note ? ?01/14/2022 ?Name: HENNESY SOBALVARRO MRN: [id number] DOB: [birth year] ? ?SARYA LINENBERGER is a 61 y.o. year old female who is a primary care patient of Denita Lung, MD and is actively engaged with the care management team. I reached out to [place] Cellar by phone today to assist with re-scheduling an initial visit with the BSW ? ?Follow up plan: ?Telephone appointment with care management team member scheduled for:01/16/22 ? ?Laverda Sorenson  ?Care Guide, Embedded Care Coordination ?[place]  Care Management  ?Direct Dial: [phone] ? ?

## 2022-01-15 ENCOUNTER — Telehealth: Payer: Self-pay

## 2022-01-15 NOTE — Telephone Encounter (Signed)
Transition Care Management Follow-up Telephone Call ?Date of discharge and from where: 01/13/2022 Dorothy Wood ?How have you been since you were released from the hospital? Vomiting, not able to keep anything down. Will be following up with the surgeon ?Any questions or concerns? No ? ?Items Reviewed: ?Did the pt receive and understand the discharge instructions provided? Yes  ?Medications obtained and verified? Yes  ?Other? No  ?Any new allergies since your discharge? No  ?Dietary orders reviewed? Yes ?Do you have support at home? Yes  ? ?Home Care and Equipment/Supplies: ?Were home health services ordered? no ?If so, what is the name of the agency? N/a  ?Has the agency set up a time to come to the patient's home? not applicable ?Were any new equipment or medical supplies ordered?  No ?What is the name of the medical supply agency? N/a ?Were you able to get the supplies/equipment? not applicable ?Do you have any questions related to the use of the equipment or supplies? No ? ?Functional Questionnaire: (I = Independent and D = Dependent) ?ADLs: I ? ?Bathing/Dressing- I ? ?Meal Prep- I ? ?Eating- I ? ?Maintaining continence- I ? ?Transferring/Ambulation- I ? ?Managing Meds- I ? ?Follow up appointments reviewed: ? ?PCP Hospital f/u appt confirmed? Yes  Scheduled to see Dr. Redmond School on 01/22/2022 @ 11:15. ?Are transportation arrangements needed? No  ?If their condition worsens, is the pt aware to call PCP or go to the Emergency Dept.? Yes ?Was the patient provided with contact information for the PCP's office or ED? Yes ?Was to pt encouraged to call back with questions or concerns? Yes ? ?

## 2022-01-16 ENCOUNTER — Ambulatory Visit (HOSPITAL_COMMUNITY)
Admission: RE | Admit: 2022-01-16 | Discharge: 2022-01-16 | Disposition: A | Discharge status: Home / Self Care | Payer: 59 | Source: Ambulatory Visit | Attending: Surgery | Admitting: Surgery

## 2022-01-16 ENCOUNTER — Telehealth: Payer: 59

## 2022-01-16 DIAGNOSIS — K828 Other specified diseases of gallbladder: Secondary | ICD-10-CM | POA: Insufficient documentation

## 2022-01-16 DIAGNOSIS — R1114 Bilious vomiting: Secondary | ICD-10-CM | POA: Insufficient documentation

## 2022-01-16 DIAGNOSIS — R1013 Epigastric pain: Secondary | ICD-10-CM | POA: Insufficient documentation

## 2022-01-16 IMAGING — NM NM HEPATO W/GB/PHARM/[PERSON_NAME]
2 series · 12 of 12 positions shown · non-contrast
Comparison: Abdominal ultrasound [DATE]

CLINICAL DATA: Gallbladder sludge. Epigastric abdominal pain.
Bilious vomiting with nausea.

EXAM:
NUCLEAR MEDICINE HEPATOBILIARY IMAGING WITH GALLBLADDER EF
TECHNIQUE: Sequential images of the abdomen were obtained [DATE] minutes
following intravenous administration of radiopharmaceutical. After
oral ingestion of Ensure, gallbladder ejection fraction was
determined. At 60 min, normal ejection fraction is greater than 33%.
RADIOPHARMACEUTICALS:  5.4 mCi [FW]  Choletec IV

[Series 1: biliary · 4.14mm/px · 6 of 60 frames shown]
[frame 6/60]
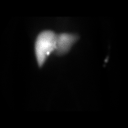
[frame 16/60]
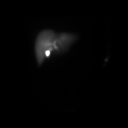
[frame 26/60]
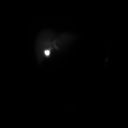
[frame 36/60]
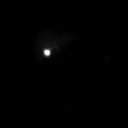
[frame 46/60]
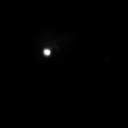
[frame 56/60]
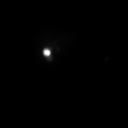

[Series 2: gbef · 4.14mm/px · 6 of 60 frames shown]
[frame 6/60]
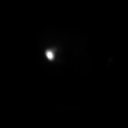
[frame 16/60]
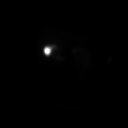
[frame 26/60]
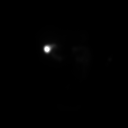
[frame 36/60]
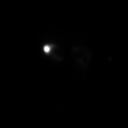
[frame 46/60]
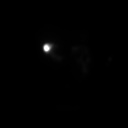
[frame 56/60]
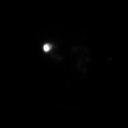

[12 of 12 positions shown; findings below may reference images not displayed]

FINDINGS: Prompt uptake and biliary excretion of activity by the liver is
seen. Gallbladder activity is visualized, consistent with patency of
cystic duct. Biliary activity passes into small bowel, consistent
with patent common bile duct.

Calculated gallbladder ejection fraction is 5%. (Normal gallbladder
ejection fraction with Ensure is greater than 33%.) The patient
experienced nausea with drinking Ensure.
IMPRESSION: 1. Abnormal gallbladder ejection fraction, 5%.
2. No acute cholecystitis, patency of the cystic and common bile
ducts.

## 2022-01-16 MED ORDER — TECHNETIUM TC 99M MEBROFENIN IV KIT
5.4000 | PACK | Freq: Once | INTRAVENOUS | Status: AC | PRN
  Administered 2022-01-16: 5.4 via INTRAVENOUS

## 2022-01-18 ENCOUNTER — Ambulatory Visit: Payer: Self-pay | Admitting: Surgery

## 2022-01-18 NOTE — Progress Notes (Signed)
HIDA scan is positive for biliary dyskinesia with a very low ejection fraction of only 5%. ? ?Patient would very likely benefit from cholecystectomy as we discussed in the office.  She has written literature on gallbladder surgery.  I would plan lap cholecystectomy with [facility] at [facility] with possible same day discharge.  Will enter orders and send to schedulers to contact patient. ? ?Claiborne Billings - see orders and forward to schedulers please. ? ?tmg ? ?Armandina Gemma, MD ?[facility] ?A [facility] practice ?Office: [phone] ?

## 2022-01-20 ENCOUNTER — Encounter: Payer: Self-pay | Admitting: Family Medicine

## 2022-01-20 NOTE — Patient Instructions (Addendum)
DUE TO COVID-19 ONLY TWO VISITORS  (aged 61 and older)  ARE ALLOWED TO COME WITH YOU AND STAY IN THE WAITING ROOM ONLY DURING PRE OP AND PROCEDURE.   ?**NO VISITORS ARE ALLOWED IN THE SHORT STAY AREA OR RECOVERY ROOM!!** ? ?IF YOU WILL BE ADMITTED INTO THE HOSPITAL YOU ARE ALLOWED ONLY FOUR SUPPORT PEOPLE DURING VISITATION HOURS ONLY (7 AM -8PM)   ?The support person(s) must pass our screening, gel in and out, and wear a mask at all times, including in the patient?s room. ?Patients must also wear a mask when staff or their support person are in the room. ?Visitors GUEST BADGE MUST BE WORN VISIBLY  ?One adult visitor may remain with you overnight and MUST be in the room by 8 P.M. ?  ? ? Your procedure is scheduled on: 01/23/22 ? ? Report to [facility] Main Entrance ? ?  Report to admitting at: 1:30 PM ? ? Call this number if you have problems the morning of surgery [phone] ?Eat a light diet the day before surgery.  Examples including soups, broths, toast, yogurt, mashed potatoes.  Things to avoid include carbonated beverages (fizzy beverages), raw fruits and raw vegetables, or beans.  ? ?If your bowels are filled with gas, your surgeon will have difficulty visualizing your pelvic organs which increases your surgical risks.  ?  ?Do not eat food :After Midnight. ? ? After Midnight you may have the following liquids until: 12:30 PM DAY OF SURGERY ? ?Water ?Black Coffee (sugar ok, NO MILK/CREAM OR CREAMERS)  ?Tea (sugar ok, NO MILK/CREAM OR CREAMERS) regular and decaf                             ?Plain Jell-O (NO RED)                                           ?Fruit ices (not with fruit pulp, NO RED)                                     ?Popsicles (NO RED)                                                                  ?Juice: apple, WHITE grape, WHITE cranberry ?Sports drinks like Gatorade (NO RED) ?Clear broth(vegetable,chicken,beef) ? ?FOLLOW BOWEL PREP AND ANY ADDITIONAL PRE OP INSTRUCTIONS YOU RECEIVED  FROM YOUR SURGEON'S OFFICE!!! ?  ?Oral Hygiene is also important to reduce your risk of infection.                                    ?Remember - BRUSH YOUR TEETH THE MORNING OF SURGERY WITH YOUR REGULAR TOOTHPASTE ? ? Do NOT smoke after Midnight ? ? Take these medicines the morning of surgery with A SIP OF WATER: bupropion,sertraline,ritalin,levothyroxine,omeprazole,guanfacine. ? ?DO NOT TAKE ANY ORAL DIABETIC MEDICATIONS DAY OF YOUR SURGERY ? ?Bring CPAP mask and tubing day of surgery. ?                  ?  You may not have any metal on your body including hair pins, jewelry, and body piercing ? ?           Do not wear make-up, lotions, powders, perfumes/cologne, or deodorant ? ?Do not wear nail polish including gel and S&S, artificial/acrylic nails, or any other type of covering on natural nails including finger and toenails. If you have artificial nails, gel coating, etc. that needs to be removed by a nail salon please have this removed prior to surgery or surgery may need to be canceled/ delayed if the surgeon/ anesthesia feels like they are unable to be safely monitored.  ? ?Do not shave  48 hours prior to surgery. ? ? Do not bring valuables to the hospital. [facility] NOT ?            RESPONSIBLE   FOR VALUABLES. ? ? Contacts, dentures or bridgework may not be worn into surgery. ? ? Bring small overnight bag day of surgery. ?  ? Patients discharged on the day of surgery will not be allowed to drive home.  Someone NEEDS to stay with you for the first 24 hours after anesthesia. ? ? Special Instructions: Bring a copy of your healthcare power of attorney and living will documents         the day of surgery if you haven't scanned them before. ? ?            Please read over the following fact sheets you were given: IF [place] [phone] ? ?   [facility] - Preparing for Surgery ?Before surgery, you can play an important role.  Because skin is not  sterile, your skin needs to be as free of germs as possible.  You can reduce the number of germs on your skin by washing with CHG (chlorahexidine gluconate) soap before surgery.  CHG is an antiseptic cleaner which kills germs and bonds with the skin to continue killing germs even after washing. ?Please DO NOT use if you have an allergy to CHG or antibacterial soaps.  If your skin becomes reddened/irritated stop using the CHG and inform your nurse when you arrive at Short Stay. ?Do not shave (including legs and underarms) for at least 48 hours prior to the first CHG shower.  You may shave your face/neck. ?Please follow these instructions carefully: ? 1.  Shower with CHG Soap the night before surgery and the  morning of Surgery. ? 2.  If you choose to wash your hair, wash your hair first as usual with your  normal  shampoo. ? 3.  After you shampoo, rinse your hair and body thoroughly to remove the  shampoo.                           4.  Use CHG as you would any other liquid soap.  You can apply chg directly  to the skin and wash  ?                     Gently with a scrungie or clean washcloth. ? 5.  Apply the CHG Soap to your body ONLY FROM THE NECK DOWN.   Do not use on face/ open      ?                     Wound or open sores. Avoid contact with eyes,  ears mouth and genitals (private parts).  ?                     Production manager,  Genitals (private parts) with your normal soap. ?            6.  Wash thoroughly, paying special attention to the area where your surgery  will be performed. ? 7.  Thoroughly rinse your body with warm water from the neck down. ? 8.  DO NOT shower/wash with your normal soap after using and rinsing off  the CHG Soap. ?               9.  Pat yourself dry with a clean towel. ?           10.  Wear clean pajamas. ?           11.  Place clean sheets on your bed the night of your first shower and do not  sleep with pets. ?Day of Surgery : ?Do not apply any lotions/deodorants the morning of surgery.   Please wear clean clothes to the hospital/surgery center. ? ?FAILURE TO FOLLOW THESE INSTRUCTIONS MAY RESULT IN THE CANCELLATION OF YOUR SURGERY ?PATIENT SIGNATURE_________________________________ ? ?NURSE SIGNATURE__________________________________ ? ?________________________________________________________________________  ?

## 2022-01-21 ENCOUNTER — Encounter: Payer: Self-pay | Admitting: Surgery

## 2022-01-21 ENCOUNTER — Encounter (HOSPITAL_COMMUNITY): Payer: Self-pay

## 2022-01-21 ENCOUNTER — Other Ambulatory Visit: Payer: Self-pay

## 2022-01-21 ENCOUNTER — Encounter (HOSPITAL_COMMUNITY)
Admission: RE | Admit: 2022-01-21 | Discharge: 2022-01-21 | Disposition: A | Discharge status: Home / Self Care | Payer: 59 | Source: Ambulatory Visit | Attending: Surgery | Admitting: Surgery

## 2022-01-21 DIAGNOSIS — Z01812 Encounter for preprocedural laboratory examination: Secondary | ICD-10-CM | POA: Diagnosis not present

## 2022-01-21 DIAGNOSIS — K828 Other specified diseases of gallbladder: Secondary | ICD-10-CM | POA: Diagnosis present

## 2022-01-21 HISTORY — DX: Unspecified osteoarthritis, unspecified site: M19.90

## 2022-01-21 HISTORY — DX: Depression, unspecified: F32.A

## 2022-01-21 HISTORY — DX: Hypothyroidism, unspecified: E03.9

## 2022-01-21 NOTE — Progress Notes (Signed)
For Short Stay: ?[facility] appointment date: ?Date of COVID positive in last 90 days: ? ?Bowel Prep reminder: ? ? ?For Anesthesia: ?PCP - Dr. Faylene Million ?Cardiologist - Dr. Kirk Ruths. LOV: 12/2020 ? ?Chest x-ray - 01/11/22 ?EKG - 01/10/22 ?Stress Test -  ?ECHO - 04/07/21 ?Cardiac Cath -  ?Pacemaker/ICD device last checked: ?Pacemaker orders received: ?Device Rep notified: ? ?Spinal Cord Stimulator: ? ?Sleep Study - Yes ?CPAP - NO ? ?Fasting Blood Sugar -  ?Checks Blood Sugar _____ times a day ?Date and result of last Hgb A1c- ? ?Blood Thinner Instructions: ?Aspirin Instructions: ?Last Dose: ? ?Activity level: Can go up a flight of stairs and activities of daily living without stopping and without chest pain and/or shortness of breath ?  Able to exercise without chest pain and/or shortness of breath ?  Unable to go up a flight of stairs without chest pain and/or shortness of breath ?   ? ?Anesthesia review: Hx: TIA,HTN,Arrythmias ? ?Patient denies shortness of breath, fever, cough and chest pain at PAT appointment ? ? ?Patient verbalized understanding of instructions that were given to them at the PAT appointment. Patient was also instructed that they will need to review over the PAT instructions again at home before surgery.  ?

## 2022-01-21 NOTE — H&P (Signed)
? ? ? ? ?REFERRING PHYSICIAN: Wyatt Haste ? ?PROVIDER: Jearldine Cassady Charlotta Newton, MD ? ? ? ?Chief Complaint: New Consultation (Abdominal pain, nausea, emesis - ? gallbladder) ? ?History of Present Illness: ? ?Patient is referred by her primary care physician and by the hospitalist during her recent hospital stay for surgical evaluation and recommendations regarding possible cholecystectomy. Patient has had 3 episodes over the last 1 to 2 years of epigastric abdominal pain associated with profuse nausea and emesis. Patient has been hospitalized from the emergency department on 2 occasions. Studies included ultrasound examinations as well as a CT scan of the abdomen and pelvis. The studies have had variable results, indicating gallbladder sludge, and possible cholelithiasis. Previous abdominal surgery includes cesarean sections and total abdominal hysterectomy. CT scan also demonstrated evidence of edema in the distal esophagus and proximal stomach consistent with reflux and the patient was started on omeprazole. Patient has improved symptomatically and was discharged home. She now presents for surgical consultation. She is accompanied by her daughter. 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 Patient denies any history of jaundice or acholic stools. She denies any history of hepatitis or pancreatitis. There is a family history of gallbladder disease in the patient's mother who underwent cholecystectomy. ? ?Review of Systems: ?A complete review of systems was obtained from the patient. I have reviewed this information and discussed as appropriate with the patient. See HPI as well for other ROS. ? ?Review of Systems  ?Constitutional: Positive for weight loss.  ?HENT: Negative.  ?Eyes: Negative.  ?Respiratory: Negative.  ?Cardiovascular: Negative.  ?Gastrointestinal: Positive for abdominal pain, nausea and vomiting.  ?Genitourinary: Negative.  ?Musculoskeletal: Negative.  ?Skin:  Negative.  ?Neurological: Negative.  ?Endo/Heme/Allergies: Negative.  ?Psychiatric/Behavioral: Negative.  ? ? ?Medical History: ?Past Medical History:  ?Diagnosis Date  ? Anxiety  ? Arrhythmia  ? Arthritis  ? GERD (gastroesophageal reflux disease)  ? Hyperlipidemia  ? Hypertension  ? Thyroid disease  ? ?Patient Active Problem List  ?Diagnosis  ? Gallbladder sludge  ? Abdominal pain, epigastric  ? ?Past Surgical History:  ?Procedure Laterality Date  ? CESAREAN DELIVERY  ? titanium implants  ? ? ?Allergies  ?Allergen Reactions  ? Reglan [Metoclopramide] Swelling  ? ?No current outpatient medications on file prior to visit.  ? ?No current facility-administered medications on file prior to visit.  ? ?Family History  ?Problem Relation Age of Onset  ? High blood pressure (Hypertension) Mother  ? Hyperlipidemia (Elevated cholesterol) Mother  ? High blood pressure (Hypertension) Father  ? Hyperlipidemia (Elevated cholesterol) Father  ? Heart valve disease Father  ? ? ?Social History  ? ?Tobacco Use  ?Smoking Status Never  ?Smokeless Tobacco Never  ? ? ?Social History  ? ?Socioeconomic History  ? Marital status: Married  ?Tobacco Use  ? Smoking status: Never  ? Smokeless tobacco: Never  ?Substance and Sexual Activity  ? Alcohol use: Not Currently  ?Alcohol/week: 3.0 standard drinks  ?Types: 3 Standard drinks or equivalent per week  ? Drug use: Never  ? ?Objective:  ? ?Vitals:  ?BP: 96/58  ?Pulse: (!) 121  ?Temp: 36.7 ?C (98.1 ?F)  ?SpO2: 97%  ?Weight: 53.5 kg (118 lb)  ?Height: 165.1 cm ('5\' 5"'$ )  ? ?Body mass index is 19.64 kg/m?. ? ?Physical Exam  ? ?GENERAL APPEARANCE ?Comfortable, no acute issues ?Development: normal ?Gross deformities: none ? ?SKIN ?Rash, lesions, ulcers: none ?Induration, erythema: none ?Nodules: none palpable ? ?EYES ?Conjunctiva and lids: normal ?Pupils:  equal and reactive ? ?EARS, NOSE, MOUTH, THROAT ?External ears: no lesion or deformity ?External nose: no lesion or deformity ?Hearing: grossly  normal ? ?NECK ?Symmetric: yes ?Trachea: midline ?Thyroid: no palpable nodules in the thyroid bed ? ?CHEST ?Respiratory effort: normal ?Retraction or accessory muscle use: no ?Breath sounds: normal bilaterally ?Rales, rhonchi, wheeze: none ? ?CARDIOVASCULAR ?Auscultation: regular rhythm, normal rate ?Murmurs: none ?Pulses: radial pulse 2+ palpable ?Lower extremity edema: none ? ?ABDOMEN ?Soft without distention. Mild tenderness to palpation in the epigastrium and right mid abdomen. No palpable masses. ? ?GENITOURINARY ?Not assessed ? ?MUSCULOSKELETAL ?Station and gait: normal ?Digits and nails: no clubbing or cyanosis ?Muscle strength: grossly normal all extremities ?Range of motion: grossly normal all extremities ?Deformity: none ? ?LYMPHATIC ?Cervical: none palpable ?Supraclavicular: none palpable ? ?PSYCHIATRIC ?Oriented to person, place, and time: yes ?Mood and affect: normal for situation ?Judgment and insight: appropriate for situation ? ? ? ?Assessment and Plan:  ? ?Gallbladder sludge ? ?Abdominal pain, epigastric ? ?Patient presents for surgical evaluation of possible chronic cholecystitis, cholelithiasis, or biliary dyskinesia. Patient is provided with written literature on gallbladder surgery to review at home. ? ?Patient has had 3 discrete episodes of abdominal pain associated with nausea and emesis. Recently she has been hospitalized with the symptoms. Imaging studies including ultrasound and CT scan have indicated gallbladder sludge and possible cholelithiasis. ? ?After discussion today, I would like to obtain a nuclear medicine hepatobiliary scan with CCK stimulation to evaluate the gallbladder function. If this study is abnormal, then I think she will likely benefit from cholecystectomy. If this study is completely normal, then I think we will ask her gastroenterologist, Dr. Wilford Corner, to evaluate her prior to any surgical intervention. I discussed this plan today with the patient and her  daughter and they are in agreement. ? ?We will order the nuclear medicine hepatobiliary scan. I will contact the patient with those results when they are available and we will make a decision regarding further management at that time.  ? ?Armandina Gemma, MD ?[facility] ?A [facility] practice ?Office: [phone] ? ?

## 2022-01-22 ENCOUNTER — Inpatient Hospital Stay: Payer: 59 | Admitting: Family Medicine

## 2022-01-22 ENCOUNTER — Telehealth: Payer: 59

## 2022-01-23 ENCOUNTER — Encounter (HOSPITAL_COMMUNITY): Payer: Self-pay | Admitting: Surgery

## 2022-01-23 ENCOUNTER — Encounter (HOSPITAL_COMMUNITY)
Admission: RE | Disposition: A | Discharge status: Home / Self Care | Payer: Self-pay | Source: Ambulatory Visit | Attending: Surgery

## 2022-01-23 ENCOUNTER — Ambulatory Visit (HOSPITAL_COMMUNITY): Payer: 59 | Admitting: Physician Assistant

## 2022-01-23 ENCOUNTER — Other Ambulatory Visit: Payer: Self-pay

## 2022-01-23 ENCOUNTER — Ambulatory Visit (HOSPITAL_BASED_OUTPATIENT_CLINIC_OR_DEPARTMENT_OTHER): Payer: 59 | Admitting: Certified Registered Nurse Anesthetist

## 2022-01-23 ENCOUNTER — Ambulatory Visit (HOSPITAL_COMMUNITY)
Admission: RE | Admit: 2022-01-23 | Discharge: 2022-01-23 | Disposition: A | Discharge status: Home / Self Care | Payer: 59 | Source: Ambulatory Visit | Attending: Surgery | Admitting: Surgery

## 2022-01-23 ENCOUNTER — Ambulatory Visit (HOSPITAL_COMMUNITY): Payer: 59

## 2022-01-23 DIAGNOSIS — E039 Hypothyroidism, unspecified: Secondary | ICD-10-CM

## 2022-01-23 DIAGNOSIS — G4730 Sleep apnea, unspecified: Secondary | ICD-10-CM | POA: Insufficient documentation

## 2022-01-23 DIAGNOSIS — M1990 Unspecified osteoarthritis, unspecified site: Secondary | ICD-10-CM | POA: Diagnosis not present

## 2022-01-23 DIAGNOSIS — K828 Other specified diseases of gallbladder: Secondary | ICD-10-CM | POA: Diagnosis not present

## 2022-01-23 DIAGNOSIS — Z8673 Personal history of transient ischemic attack (TIA), and cerebral infarction without residual deficits: Secondary | ICD-10-CM | POA: Diagnosis not present

## 2022-01-23 DIAGNOSIS — K811 Chronic cholecystitis: Secondary | ICD-10-CM | POA: Diagnosis not present

## 2022-01-23 DIAGNOSIS — K219 Gastro-esophageal reflux disease without esophagitis: Secondary | ICD-10-CM | POA: Diagnosis not present

## 2022-01-23 DIAGNOSIS — I10 Essential (primary) hypertension: Secondary | ICD-10-CM

## 2022-01-23 DIAGNOSIS — Z8379 Family history of other diseases of the digestive system: Secondary | ICD-10-CM | POA: Insufficient documentation

## 2022-01-23 DIAGNOSIS — Z9889 Other specified postprocedural states: Secondary | ICD-10-CM | POA: Diagnosis not present

## 2022-01-23 HISTORY — PX: CHOLECYSTECTOMY: SHX55

## 2022-01-23 IMAGING — RF DG CHOLANGIOGRAM OPERATIVE
1 series · 6 of 6 positions shown · non-contrast
Comparison: None

CLINICAL DATA: 61-year-old female with cholelithiasis

EXAM:
INTRAOPERATIVE CHOLANGIOGRAM
TECHNIQUE: Cholangiographic images from the C-arm fluoroscopic device were
submitted for interpretation post-operatively. Please see the
procedural report for the amount of contrast and the fluoroscopy
time utilized.
FLUOROSCOPY:
Radiation Exposure Index (as provided by the fluoroscopic device):
11 seconds

[Series 1: run · 3 acquisitions, 6 frames shown]
[im 1/3]
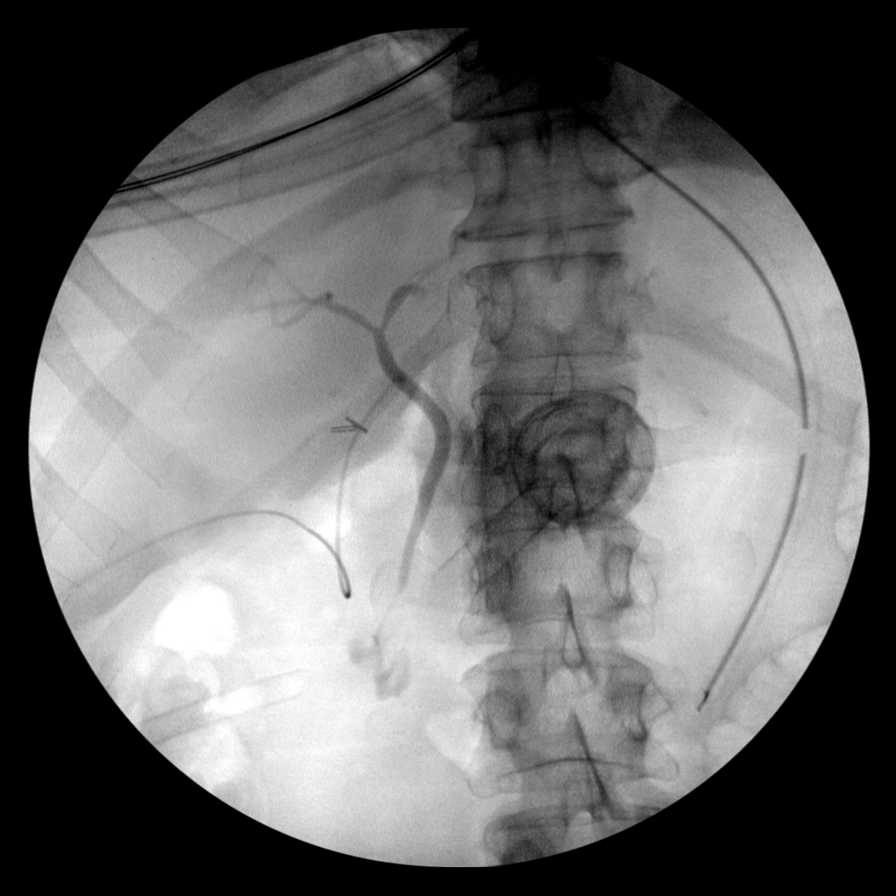
[im 1/3]
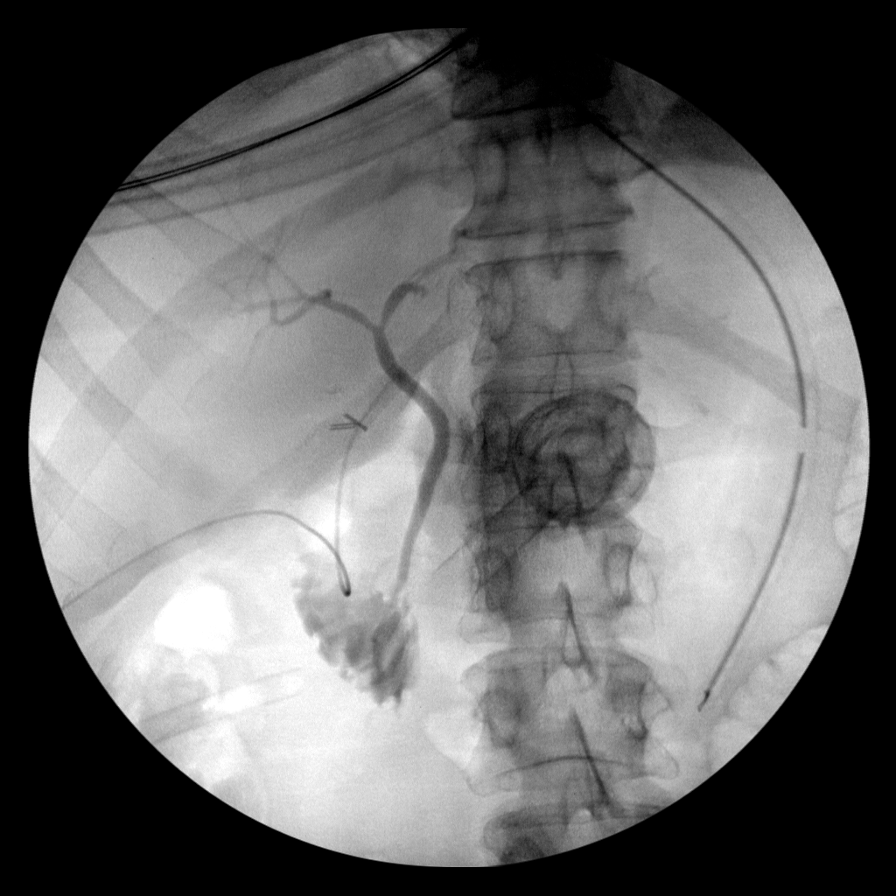
[im 1/3]
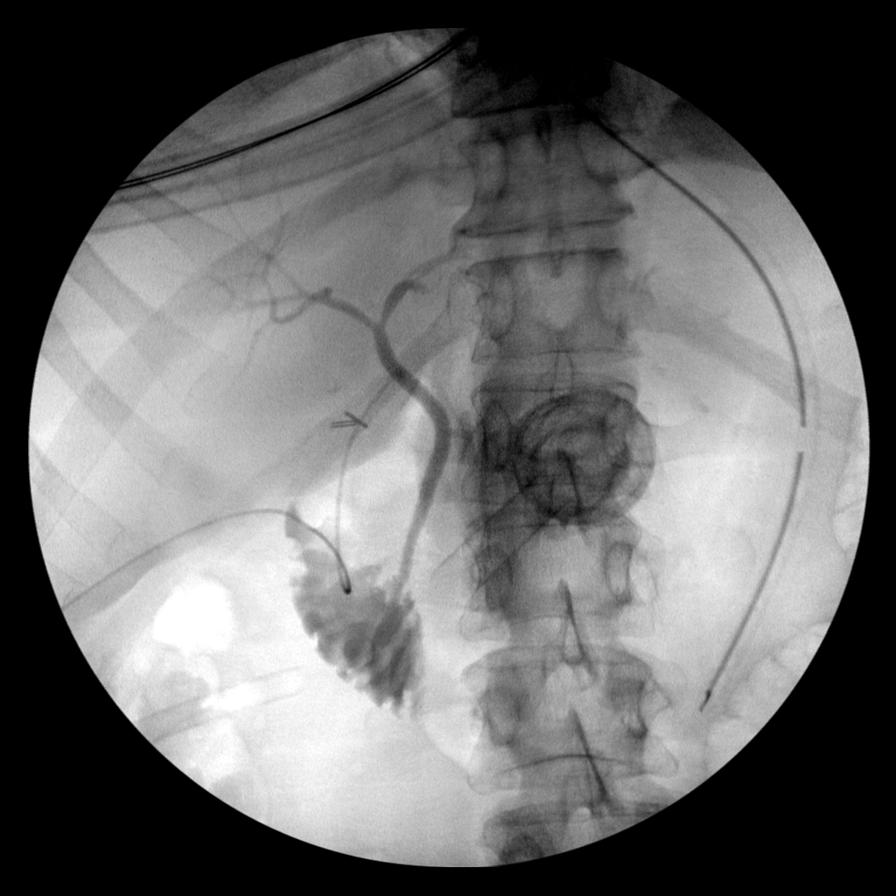
[im 1/3]
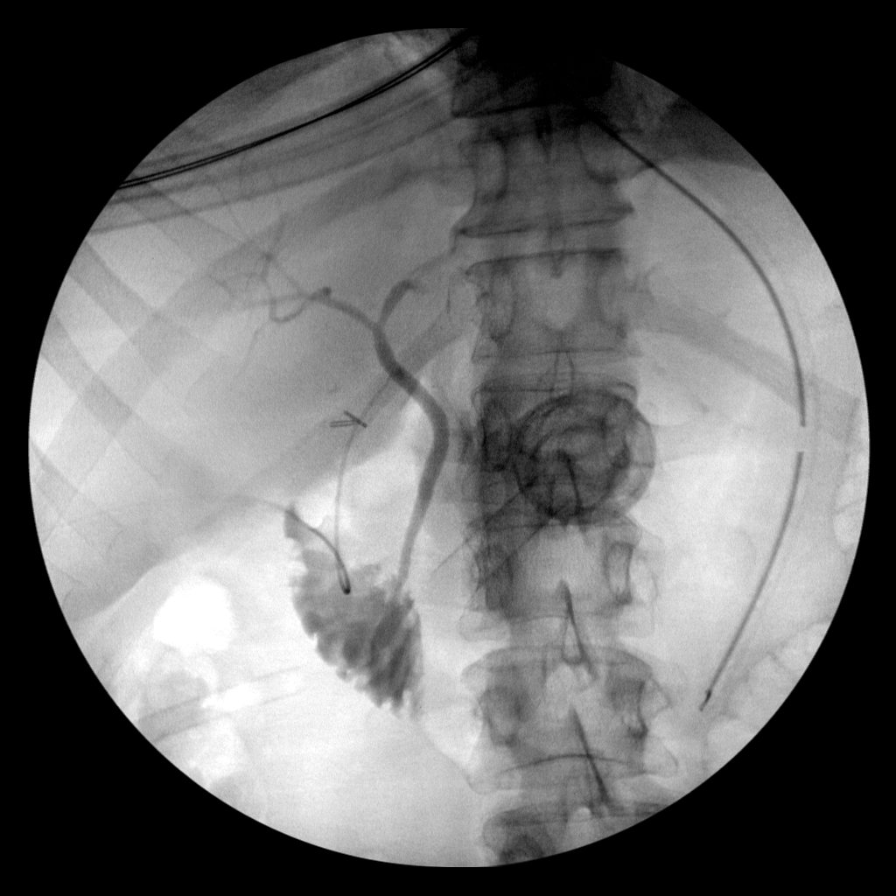
[im 2/3]
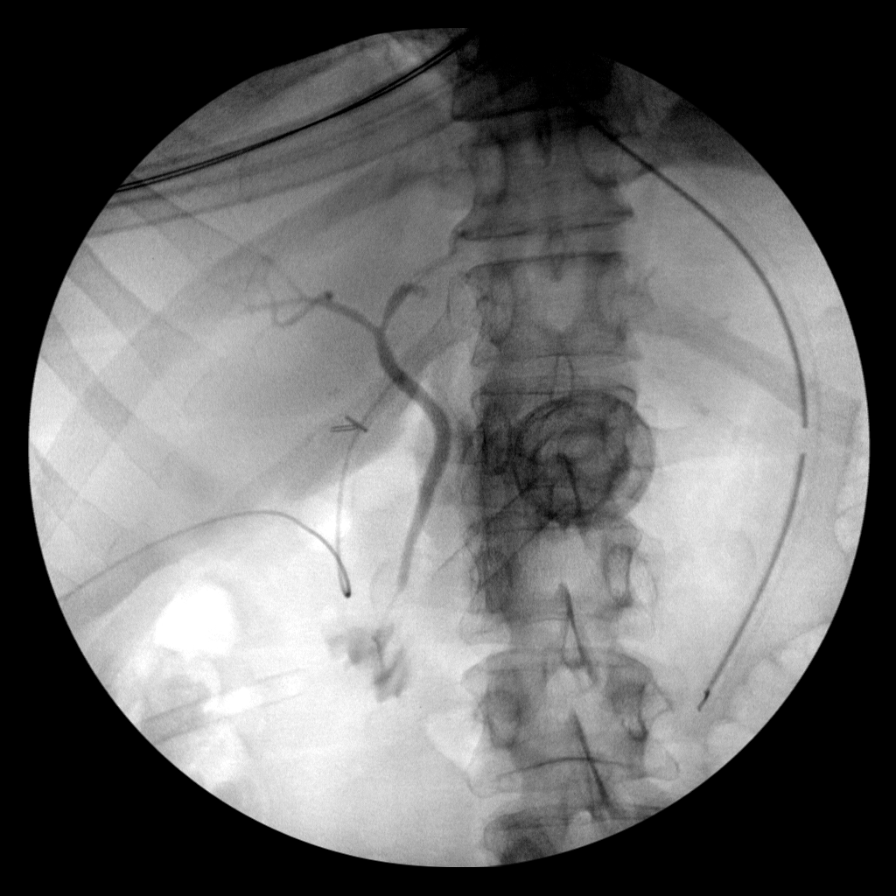
[im 3/3]
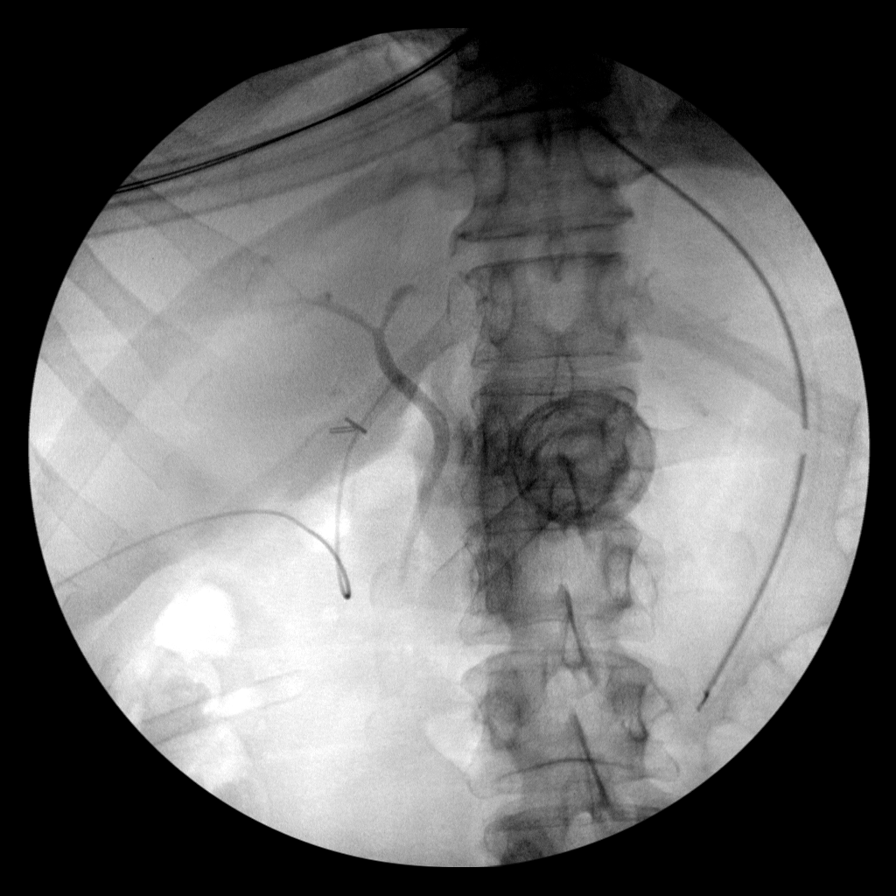

[6 of 6 positions shown; findings below may reference images not displayed]

FINDINGS: Surgical instruments project over the upper abdomen.

There is cannulation of the cystic duct/gallbladder neck, with
antegrade infusion of contrast. Caliber of the extrahepatic ductal
system within normal limits.

No definite filling defect within the extrahepatic ducts identified.

Free flow of contrast across the ampulla.
IMPRESSION: Intraoperative cholangiogram demonstrates extrahepatic biliary ducts
of unremarkable caliber, with no definite filling defects
identified. Free flow of contrast across the ampulla.

Please refer to the dictated operative report for full details of
intraoperative findings and procedure

## 2022-01-23 SURGERY — LAPAROSCOPIC CHOLECYSTECTOMY WITH INTRAOPERATIVE CHOLANGIOGRAM
Anesthesia: General | Site: Abdomen

## 2022-01-23 MED ORDER — 0.9 % SODIUM CHLORIDE (POUR BTL) OPTIME
TOPICAL | Status: DC | PRN
Start: 2022-01-23 — End: 2022-01-23
  Administered 2022-01-23: 1000 mL

## 2022-01-23 MED ORDER — PROPOFOL 10 MG/ML IV BOLUS
INTRAVENOUS | Status: AC
  Filled 2022-01-23: qty 20

## 2022-01-23 MED ORDER — MIDAZOLAM HCL 2 MG/2ML IJ SOLN
INTRAMUSCULAR | Status: DC | PRN
  Administered 2022-01-23: 2 mg via INTRAVENOUS

## 2022-01-23 MED ORDER — FENTANYL CITRATE PF 50 MCG/ML IJ SOSY
25.0000 ug | PREFILLED_SYRINGE | INTRAMUSCULAR | Status: DC | PRN
  Administered 2022-01-23: 50 ug via INTRAVENOUS

## 2022-01-23 MED ORDER — CHLORHEXIDINE GLUCONATE 0.12 % MT SOLN
15.0000 mL | Freq: Once | OROMUCOSAL | Status: AC
  Administered 2022-01-23: 15 mL via OROMUCOSAL

## 2022-01-23 MED ORDER — OXYCODONE HCL 5 MG/5ML PO SOLN: 5.0000 mg | Freq: Once | ORAL | Status: DC | PRN

## 2022-01-23 MED ORDER — ACETAMINOPHEN 325 MG PO TABS: 325.0000 mg | ORAL_TABLET | ORAL | Status: DC | PRN

## 2022-01-23 MED ORDER — CEFAZOLIN SODIUM-DEXTROSE 2-4 GM/100ML-% IV SOLN
2.0000 g | INTRAVENOUS | Status: AC
  Administered 2022-01-23: 2 g via INTRAVENOUS
  Filled 2022-01-23: qty 100

## 2022-01-23 MED ORDER — ROCURONIUM BROMIDE 10 MG/ML (PF) SYRINGE
PREFILLED_SYRINGE | INTRAVENOUS | Status: DC | PRN
  Administered 2022-01-23: 60 mg via INTRAVENOUS

## 2022-01-23 MED ORDER — DEXAMETHASONE SODIUM PHOSPHATE 10 MG/ML IJ SOLN
INTRAMUSCULAR | Status: DC | PRN
  Administered 2022-01-23: 4 mg via INTRAVENOUS

## 2022-01-23 MED ORDER — FENTANYL CITRATE (PF) 100 MCG/2ML IJ SOLN
INTRAMUSCULAR | Status: AC
  Filled 2022-01-23: qty 2

## 2022-01-23 MED ORDER — FENTANYL CITRATE (PF) 250 MCG/5ML IJ SOLN
INTRAMUSCULAR | Status: DC | PRN
Start: 2022-01-23 — End: 2022-01-23
  Administered 2022-01-23: 100 ug via INTRAVENOUS

## 2022-01-23 MED ORDER — PROPOFOL 10 MG/ML IV BOLUS
INTRAVENOUS | Status: DC | PRN
  Administered 2022-01-23: 130 mg via INTRAVENOUS

## 2022-01-23 MED ORDER — LACTATED RINGERS IV SOLN: INTRAVENOUS | Status: DC

## 2022-01-23 MED ORDER — MIDAZOLAM HCL 2 MG/2ML IJ SOLN
INTRAMUSCULAR | Status: AC
  Filled 2022-01-23: qty 2

## 2022-01-23 MED ORDER — IOHEXOL 300 MG/ML  SOLN
INTRAMUSCULAR | Status: DC | PRN
  Administered 2022-01-23: 6 mL

## 2022-01-23 MED ORDER — ACETAMINOPHEN 10 MG/ML IV SOLN: 1000.0000 mg | Freq: Once | INTRAVENOUS | Status: DC | PRN

## 2022-01-23 MED ORDER — BUPIVACAINE-EPINEPHRINE (PF) 0.5% -1:200000 IJ SOLN
INTRAMUSCULAR | Status: AC
  Filled 2022-01-23: qty 30

## 2022-01-23 MED ORDER — CHLORHEXIDINE GLUCONATE CLOTH 2 % EX PADS: 6.0000 | MEDICATED_PAD | Freq: Once | CUTANEOUS | Status: DC

## 2022-01-23 MED ORDER — ACETAMINOPHEN 160 MG/5ML PO SOLN: 325.0000 mg | ORAL | Status: DC | PRN

## 2022-01-23 MED ORDER — AMISULPRIDE (ANTIEMETIC) 5 MG/2ML IV SOLN: 10.0000 mg | Freq: Once | INTRAVENOUS | Status: DC | PRN

## 2022-01-23 MED ORDER — SUGAMMADEX SODIUM 200 MG/2ML IV SOLN
INTRAVENOUS | Status: DC | PRN
  Administered 2022-01-23: 110 mg via INTRAVENOUS

## 2022-01-23 MED ORDER — CHLORHEXIDINE GLUCONATE CLOTH 2 % EX PADS
6.0000 | MEDICATED_PAD | Freq: Once | CUTANEOUS | Status: DC
Start: 2022-01-23 — End: 2022-01-23

## 2022-01-23 MED ORDER — ORAL CARE MOUTH RINSE: 15.0000 mL | Freq: Once | OROMUCOSAL | Status: AC

## 2022-01-23 MED ORDER — TRAMADOL HCL 50 MG PO TABS: 50.0000 mg | ORAL_TABLET | Freq: Four times a day (QID) | ORAL | 0 refills | Status: DC | PRN

## 2022-01-23 MED ORDER — LACTATED RINGERS IR SOLN
Status: DC | PRN
  Administered 2022-01-23: 1000 mL

## 2022-01-23 MED ORDER — LIDOCAINE 2% (20 MG/ML) 5 ML SYRINGE
INTRAMUSCULAR | Status: DC | PRN
  Administered 2022-01-23: 40 mg via INTRAVENOUS

## 2022-01-23 MED ORDER — BUPIVACAINE-EPINEPHRINE 0.5% -1:200000 IJ SOLN
INTRAMUSCULAR | Status: DC | PRN
  Administered 2022-01-23: 10 mL

## 2022-01-23 MED ORDER — OXYCODONE HCL 5 MG PO TABS: 5.0000 mg | ORAL_TABLET | Freq: Once | ORAL | Status: DC | PRN

## 2022-01-23 MED ORDER — FENTANYL CITRATE PF 50 MCG/ML IJ SOSY
PREFILLED_SYRINGE | INTRAMUSCULAR | Status: AC
  Administered 2022-01-23: 50 ug via INTRAVENOUS
  Filled 2022-01-23: qty 3

## 2022-01-23 MED ORDER — ONDANSETRON HCL 4 MG/2ML IJ SOLN
INTRAMUSCULAR | Status: DC | PRN
  Administered 2022-01-23: 4 mg via INTRAVENOUS

## 2022-01-23 SURGICAL SUPPLY — 41 items
APPLIER CLIP ROT 10 11.4 M/L (STAPLE) ×2
BAG COUNTER SPONGE SURGICOUNT (BAG) IMPLANT
BAG RETRIEVAL 10 (BASKET) ×1
CABLE HIGH FREQUENCY MONO STRZ (ELECTRODE) ×2 IMPLANT
CHLORAPREP W/TINT 26 (MISCELLANEOUS) ×3 IMPLANT
CLIP APPLIE ROT 10 11.4 M/L (STAPLE) ×1 IMPLANT
COVER MAYO STAND XLG (MISCELLANEOUS) ×2 IMPLANT
COVER SURGICAL LIGHT HANDLE (MISCELLANEOUS) ×2 IMPLANT
DERMABOND ADVANCED (GAUZE/BANDAGES/DRESSINGS) ×1
DERMABOND ADVANCED .7 DNX12 (GAUZE/BANDAGES/DRESSINGS) IMPLANT
DRAPE C-ARM 42X120 X-RAY (DRAPES) ×2 IMPLANT
ELECT REM PT RETURN 15FT ADLT (MISCELLANEOUS) ×2 IMPLANT
GAUZE SPONGE 2X2 8PLY STRL LF (GAUZE/BANDAGES/DRESSINGS) ×1 IMPLANT
GLOVE SURG ORTHO 8.0 STRL STRW (GLOVE) ×2 IMPLANT
GLOVE SURG SYN 7.5  E (GLOVE) ×4
GLOVE SURG SYN 7.5 E (GLOVE) ×2 IMPLANT
GLOVE SURG SYN 7.5 PF PI (GLOVE) ×2 IMPLANT
GOWN STRL REUS W/ TWL XL LVL3 (GOWN DISPOSABLE) ×2 IMPLANT
GOWN STRL REUS W/TWL XL LVL3 (GOWN DISPOSABLE) ×4
HEMOSTAT SURGICEL 4X8 (HEMOSTASIS) IMPLANT
IRRIG SUCT STRYKERFLOW 2 WTIP (MISCELLANEOUS) ×2
IRRIGATION SUCT STRKRFLW 2 WTP (MISCELLANEOUS) ×1 IMPLANT
KIT BASIN OR (CUSTOM PROCEDURE TRAY) ×2 IMPLANT
KIT TURNOVER KIT A (KITS) ×1 IMPLANT
PENCIL SMOKE EVACUATOR (MISCELLANEOUS) IMPLANT
SCISSORS LAP 5X35 DISP (ENDOMECHANICALS) ×2 IMPLANT
SET CHOLANGIOGRAPH MIX (MISCELLANEOUS) ×2 IMPLANT
SET TUBE SMOKE EVAC HIGH FLOW (TUBING) ×1 IMPLANT
SLEEVE XCEL OPT CAN 5 100 (ENDOMECHANICALS) ×2 IMPLANT
SPIKE FLUID TRANSFER (MISCELLANEOUS) ×1 IMPLANT
SPONGE GAUZE 2X2 STER 10/PKG (GAUZE/BANDAGES/DRESSINGS)
STRIP CLOSURE SKIN 1/2X4 (GAUZE/BANDAGES/DRESSINGS) IMPLANT
SUT MNCRL AB 4-0 PS2 18 (SUTURE) ×2 IMPLANT
SYS BAG RETRIEVAL 10MM (BASKET) ×1
SYSTEM BAG RETRIEVAL 10MM (BASKET) ×1 IMPLANT
TOWEL OR 17X26 10 PK STRL BLUE (TOWEL DISPOSABLE) ×2 IMPLANT
TOWEL OR NON WOVEN STRL DISP B (DISPOSABLE) ×2 IMPLANT
TRAY LAPAROSCOPIC (CUSTOM PROCEDURE TRAY) ×2 IMPLANT
TROCAR BLADELESS OPT 5 100 (ENDOMECHANICALS) ×2 IMPLANT
TROCAR XCEL BLUNT TIP 100MML (ENDOMECHANICALS) ×2 IMPLANT
TROCAR XCEL NON-BLD 11X100MML (ENDOMECHANICALS) ×2 IMPLANT

## 2022-01-23 NOTE — Anesthesia Postprocedure Evaluation (Signed)
Anesthesia Post Note ? ?Patient: Dorothy Wood ? ?Procedure(s) Performed: LAPAROSCOPIC CHOLECYSTECTOMY WITH INTRAOPERATIVE CHOLANGIOGRAM (Abdomen) ? ?  ? ?Patient location during evaluation: PACU ?Anesthesia Type: General ?Level of consciousness: awake and alert ?Pain management: pain level controlled ?Vital Signs Assessment: post-procedure vital signs reviewed and stable ?Respiratory status: spontaneous breathing, nonlabored ventilation, respiratory function stable and patient connected to nasal cannula oxygen ?Cardiovascular status: blood pressure returned to baseline and stable ?Postop Assessment: no apparent nausea or vomiting ?Anesthetic complications: no ? ? ?No notable events documented. ? ?Last Vitals:  ?Vitals:  ? 01/23/22 1700 01/23/22 1715  ?BP: 117/65 (!) 107/96  ?Pulse: 74 79  ?Resp: (!) 8   ?Temp:    ?SpO2: 95% 91%  ?  ?Last Pain:  ?Vitals:  ? 01/23/22 1700  ?TempSrc:   ?PainSc: 2   ? ? ?  ?  ?  ?  ?  ?  ? ?Effie Berkshire ? ? ? ? ?

## 2022-01-23 NOTE — Interval H&P Note (Signed)
History and Physical Interval Note: ? ?01/23/2022 ?2:49 PM ? ?[place] [facility]  has presented today for surgery, with the diagnosis of BILIARY DYSKINESIA, GALLBLADDER SLUDGE.  The various methods of treatment have been discussed with the patient and family. After consideration of risks, benefits and other options for treatment, the patient has consented to ? ?  Procedure(s): ?LAPAROSCOPIC CHOLECYSTECTOMY WITH INTRAOPERATIVE CHOLANGIOGRAM (N/A) as a surgical intervention.   ? ?The patient's history has been reviewed, patient examined, no change in status, stable for surgery.  I have reviewed the patient's chart and labs.  Questions were answered to the patient's satisfaction.   ? ?Armandina Gemma, MD ?[facility] ?A [facility] practice ?Office: [phone] ? ? ?Armandina Gemma ? ? ?

## 2022-01-23 NOTE — Anesthesia Procedure Notes (Signed)
Procedure Name: Intubation ?Date/Time: 01/23/2022 3:21 PM ?Performed by: Eben Burow, CRNA ?Pre-anesthesia Checklist: Patient identified, Emergency Drugs available, Suction available, Patient being monitored and Timeout performed ?Patient Re-evaluated:Patient Re-evaluated prior to induction ?Oxygen Delivery Method: Circle system utilized ?Preoxygenation: Pre-oxygenation with 100% oxygen ?Induction Type: IV induction ?Ventilation: Mask ventilation without difficulty ?Laryngoscope Size: Mac and 4 ?Grade View: Grade I ?Tube type: Oral ?Tube size: 7.0 mm ?Number of attempts: 1 ?Airway Equipment and Method: Stylet ?Placement Confirmation: ETT inserted through vocal cords under direct vision, positive ETCO2 and breath sounds checked- equal and bilateral ?Secured at: 21 cm ?Tube secured with: Tape ?Dental Injury: Teeth and Oropharynx as per pre-operative assessment  ? ? ? ? ?

## 2022-01-23 NOTE — Transfer of Care (Signed)
Immediate Anesthesia Transfer of Care Note ? ?Patient: Dorothy Wood ? ?Procedure(s) Performed: LAPAROSCOPIC CHOLECYSTECTOMY WITH INTRAOPERATIVE CHOLANGIOGRAM (Abdomen) ? ?Patient Location: PACU ? ?Anesthesia Type:General ? ?Level of Consciousness: awake, alert  and patient cooperative ? ?Airway & Oxygen Therapy: Patient Spontanous Breathing and Patient connected to face mask oxygen ? ?Post-op Assessment: Report given to RN and Post -op Vital signs reviewed and stable ? ?Post vital signs: Reviewed and stable ? ?Last Vitals:  ?Vitals Value Taken Time  ?BP 134/76 01/23/22 1637  ?Temp    ?Pulse 78 01/23/22 1640  ?Resp 17 01/23/22 1639  ?SpO2 100 % 01/23/22 1640  ?Vitals shown include unvalidated device data. ? ?Last Pain:  ?Vitals:  ? 01/23/22 1254  ?TempSrc:   ?PainSc: 0-No pain  ?   ? ?  ? ?Complications: No notable events documented. ?

## 2022-01-23 NOTE — Op Note (Signed)
Procedure Note ? ?Pre-operative Diagnosis:  biliary dyskinesia, gallbladder sludge ? ?Post-operative Diagnosis:  same ? ?Surgeon:  Armandina Gemma, MD ? ?Assistant:  Autumn Messing, MD  ? ?Procedure:  Laparoscopic cholecystectomy with intra-operative cholangiography ? ?Anesthesia:  General ? ?Estimated Blood Loss:  minimal ? ?Drains: none ?        ?Specimen: gallbladder to pathology ? ?Indications:  Patient is referred by her primary care physician and by the hospitalist during her recent hospital stay for surgical evaluation and recommendations regarding possible cholecystectomy. Patient has had 3 episodes over the last 1 to 2 years of epigastric abdominal pain associated with profuse nausea and emesis. Patient has been hospitalized from the emergency department on 2 occasions. Studies included ultrasound examinations as well as a CT scan of the abdomen and pelvis. The studies have had variable results, indicating gallbladder sludge, and possible cholelithiasis. Previous abdominal surgery includes cesarean sections and total abdominal hysterectomy. CT scan also demonstrated evidence of edema in the distal esophagus and proximal stomach consistent with reflux and the patient was started on omeprazole. Patient has improved symptomatically and was discharged home. She now presents for surgical consultation. ? ?Procedure description: The patient was seen in the pre-op holding area. The risks, benefits, complications, treatment options, and expected outcomes were previously discussed with the patient. The patient agreed with the proposed plan and has signed the informed consent form. ? ?The patient was transported to operating room #1 at the [facility]. The patient was placed in the supine position on the operating room table. Following induction of general anesthesia, the abdomen was prepped and draped in the usual aseptic fashion. ? ?An incision was made in the skin near the umbilicus. The midline fascia was incised  and the peritoneal cavity was entered and a Hasson cannula was introduced under direct vision. The cannula was secured with a 0-Vicryl pursestring suture. Pneumoperitoneum was established with carbon dioxide. Additional cannulae were introduced under direct vision along the right costal margin in the midline, mid-clavicular line, and anterior axillary line. ?  ?The gallbladder was identified and the fundus grasped and retracted cephalad. Adhesions were taken down bluntly and the electrocautery was utilized as needed, taking care not to involve any adjacent structures. The gallbladder was markedly edematous and without any turgor. The infundibulum was grasped and retracted laterally, exposing the peritoneum overlying the triangle of Calot. The peritoneum was incised and structures exposed with blunt dissection. The cystic duct was clearly identified, bluntly dissected circumferentially, and clipped at the neck of the gallbladder. ? ?An incision was made in the cystic duct and the cholangiogram catheter introduced. The catheter was secured using an ligaclip.  Real-time cholangiography was performed using C-arm fluoroscopy.  There was rapid filling of a normal caliber common bile duct.  There was reflux of contrast into the left and right hepatic ductal systems.  There was free flow distally into the duodenum without filling defect or obstruction.  The catheter was removed from the peritoneal cavity. ? ?The cystic duct was then ligated with ligaclips and divided. The cystic artery was identified, dissected circumferentially, ligated with ligaclips, and divided. ? ?The gallbladder was dissected away from the gallbladder bed using the electrocautery for hemostasis. The gallbladder was completely removed from the liver and placed into an endocatch bag. The gallbladder was removed in the endocatch bag through the umbilical port site and submitted to pathology for review. ? ?The right upper quadrant was irrigated and the  gallbladder bed was inspected. Hemostasis was achieved with  the electrocautery. ? ?Cannulae were removed under direct vision and good hemostasis was noted. Pneumoperitoneum was released and the majority of the carbon dioxide evacuated. The umbilical wound was irrigated and the fascia was then closed with the pursestring suture.  Local anesthetic was infiltrated at all port sites. Skin incisions were closed with 4-0 Monocril subcuticular sutures and Dermabond was applied. ? ?Instrument, sponge, and needle counts were correct at the conclusion of the case.  The patient was awakened from anesthesia and brought to the recovery room in stable condition.  The patient tolerated the procedure well. ? ? ?Armandina Gemma, MD ?[facility] ?Office: [phone] ?  ?

## 2022-01-23 NOTE — Discharge Instructions (Addendum)
[facility]  LAPAROSCOPIC SURGERY:  POST-OP INSTRUCTIONS  Always review your discharge instruction sheet given to you by the facility where your surgery was performed.  A prescription for pain medication may be given to you upon discharge.  Take your pain medication as prescribed.  If narcotic pain medicine is not needed, then you may take acetaminophen (Tylenol) or ibuprofen (Advil) as needed.  Take your usually prescribed medications unless otherwise directed.  If you need a refill on your pain medication, please contact your pharmacy.  They will contact our office to request authorization. Prescriptions will not be filled after 5 P.M. or on weekends.  You should follow a light diet the first few days after arrival home, such as soup and crackers or toast.  Be sure to include plenty of fluids daily.  Most patients will experience some swelling and bruising in the area of the incisions.  Ice packs will help.  Swelling and bruising can take several days to resolve.   It is common to experience some constipation after surgery.  Increasing fluid intake and taking a stool softener (such as Colace) will usually help or prevent this problem from occurring.  A mild laxative (Milk of Magnesia or Miralax) should be taken according to package instructions if there has been no bowel movement after 48 hours.  You will likely have Dermabond (topical glue) over your incisions.  This seals the incisions and allows you to bathe and shower at any time after your surgery.  Glue should remain in place for up to 10 days.  It may be removed after 10 days by pealing off the Dermabond material or using Vaseline or naval jelly to remove.  If you have steri-strips over your incisions, you may remove the gauze bandage on the second day after surgery, and you may shower at that time.  Leave your steri-strips (small skin tapes) in place directly over the incision.  These strips should remain on the  skin for 5-7 days and then be removed.  You may get them wet in the shower and pat them dry.  Any sutures or staples will be removed at the office during your follow-up visit.  ACTIVITIES:  You may resume regular (light) daily activities beginning the next day - such as daily self-care, walking, climbing stairs - gradually increasing activities as tolerated.  You may have sexual intercourse when it is comfortable.  Refrain from any heavy lifting or straining until approved by your doctor.  You may drive when you are no longer taking prescription pain medication, when you can comfortably wear a seatbelt, and when you can safely maneuver your car and apply brakes.  You should see your doctor in the office for a follow-up appointment approximately 2-3 weeks after your surgery.  Make sure that you call for this appointment within a day or two after you arrive home to insure a convenient appointment time.  WHEN TO CALL YOUR DOCTOR: Fever over 101.0 Inability to urinate Continued bleeding from incision Increased pain, redness, or drainage from the incision Increasing abdominal pain  The clinic staff is available to answer your questions during regular business hours.  Please don't hesitate to call and ask to speak to one of the nurses for clinical concerns.  If you have a medical emergency, go to the nearest emergency room or call 911.  A surgeon from [facility] is always on call for the hospital.  Todd Gerkin, MD [facility] Office: [phone] Toll Free:    [phone] FAX [phone]  Website: [email or web address] 

## 2022-01-23 NOTE — Anesthesia Preprocedure Evaluation (Addendum)
Anesthesia Evaluation  ?Patient identified by MRN, date of birth, ID band ?Patient awake ? ? ? ?Reviewed: ?Allergy & Precautions, NPO status , Patient's Chart, lab work & pertinent test results ? ?Airway ?Mallampati: I ? ?TM Distance: >3 FB ?Neck ROM: Full ? ? ? Dental ? ?(+) Teeth Intact, Dental Advisory Given ?  ?Pulmonary ?sleep apnea ,  ?  ?breath sounds clear to auscultation ? ? ? ? ? ? Cardiovascular ?hypertension, Pt. on medications ? ?Rhythm:Regular Rate:Normal ? ? ?  ?Neuro/Psych ?PSYCHIATRIC DISORDERS Depression TIACVA   ? GI/Hepatic ?Neg liver ROS, GERD  Medicated,  ?Endo/Other  ?Hypothyroidism  ? Renal/GU ?  ? ?  ?Musculoskeletal ? ?(+) Arthritis ,  ? Abdominal ?Normal abdominal exam  (+)   ?Peds ? Hematology ?negative hematology ROS ?(+)   ?Anesthesia Other Findings ? ? Reproductive/Obstetrics ? ?  ? ? ? ? ? ? ? ? ? ? ? ? ? ?  ?  ? ? ? ? ? ? ? ?Anesthesia Physical ?Anesthesia Plan ? ?ASA: 3 ? ?Anesthesia Plan: General  ? ?Post-op Pain Management:   ? ?Induction: Intravenous ? ?PONV Risk Score and Plan: 4 or greater and Ondansetron, Dexamethasone, Midazolam and Scopolamine patch - Pre-op ? ?Airway Management Planned: Oral ETT ? ?Additional Equipment: None ? ?Intra-op Plan:  ? ?Post-operative Plan: Extubation in OR ? ?Informed Consent: I have reviewed the patients History and Physical, chart, labs and discussed the procedure including the risks, benefits and alternatives for the proposed anesthesia with the patient or authorized representative who has indicated his/her understanding and acceptance.  ? ? ? ?Dental advisory given ? ?Plan Discussed with: CRNA ? ?Anesthesia Plan Comments:   ? ? ? ? ? ?Anesthesia Quick Evaluation ? ?

## 2022-01-24 ENCOUNTER — Encounter (HOSPITAL_COMMUNITY): Payer: Self-pay | Admitting: Surgery

## 2022-01-26 ENCOUNTER — Other Ambulatory Visit (HOSPITAL_COMMUNITY): Payer: Self-pay

## 2022-01-27 ENCOUNTER — Ambulatory Visit: Payer: 59

## 2022-01-27 ENCOUNTER — Other Ambulatory Visit: Payer: Self-pay

## 2022-01-27 ENCOUNTER — Other Ambulatory Visit: Payer: Self-pay | Admitting: Family Medicine

## 2022-01-27 ENCOUNTER — Other Ambulatory Visit (HOSPITAL_COMMUNITY): Payer: Self-pay

## 2022-01-27 DIAGNOSIS — R7302 Impaired glucose tolerance (oral): Secondary | ICD-10-CM

## 2022-01-27 DIAGNOSIS — K828 Other specified diseases of gallbladder: Secondary | ICD-10-CM

## 2022-01-27 LAB — SURGICAL PATHOLOGY

## 2022-01-27 MED ORDER — ATORVASTATIN CALCIUM 80 MG PO TABS
80.0000 mg | ORAL_TABLET | Freq: Every day | ORAL | 0 refills | Status: DC
  Filled 2022-01-27: qty 90, 90d supply, fill #0

## 2022-01-27 NOTE — Patient Instructions (Signed)
Visit Information ? ?Thank you for taking time to visit with me today. Please don't hesitate to contact me if I can be of assistance to you before our next scheduled telephone appointment. ? ?Please call the care guide team at [phone] if you need to schedule a future appointment with me ? ?If you are experiencing a Mental Health or [place] or need someone to talk to, please go to [facility] [address], [place] [phone])  ? ?Following is a copy of your full plan of care:  ?There are no care plans that you recently modified to display for this patient. ? ? ?Dorothy Wood was given information about Care Management services by the embedded care coordination team including:  ?Care Management services include personalized support from designated clinical staff supervised by her physician, including individualized plan of care and coordination with other care providers ?24/7 contact phone numbers for assistance for urgent and routine care needs. ?The patient may stop CCM services at any time (effective at the end of the month) by phone call to the office staff. ? ?Patient agreed to services and verbal consent obtained.  ? ?Patient verbalizes understanding of instructions and care plan provided today and agrees to view in [place]. Active MyChart status confirmed with patient.   ? ?No follow up planned at this time. I have reached out to Dr. Redmond School on your behalf regarding a referral for a nutritionist. Please follow up with him as needed. ? ?Daneen Schick, BSW, CDP ?Social Worker, Certified Dementia Practitioner ?[facility] / [place] [facility] ?[phone] ? ?   ? ? ?  ?

## 2022-01-27 NOTE — Chronic Care Management (AMB) (Signed)
? Social Work Note ? ?01/27/2022 ?Name: Dorothy Wood MRN: [id number] DOB: Nov 29, 1960 ? ?Dorothy Wood is a 61 y.o. year old female who is a primary care patient of Dorothy Lung, MD.  The Care Management team was consulted for assistance with chronic disease management and care coordination needs. ? ?Dorothy Wood was given information about Care Management services today including:  ?Care Management services include personalized support from designated clinical staff supervised by her physician, including individualized plan of care and coordination with other care providers ?24/7 contact phone numbers for assistance for urgent and routine care needs. ?The patient may stop care management services at any time (effective at the end of the month) by phone call to the office staff. ? ?Patient agreed to services and consent obtained.  ? ?Engaged with patient by telephone for initial visit in response to provider referral for social work chronic care management and care coordination services. ? ?Successful outbound call placed to the patient to assess for care coordination needs. Discussed the patient is feeling much better since returning home from her recent surgery. Patient reports she lost 15 pounds due to how sick she was and requests a referral to a nutritionist. Advised the patient SW would collaborate with her primary provider to inform of patients request.  ? ?Collaboration with Dr. Redmond School via in-basket message to communicate patients desire to be seen by a nutritionist.  ? ?Assessment: Review of patient past medical history, allergies, medications, and health status, including review of pertinent consultant reports was performed as part of comprehensive evaluation and provision of care management/care coordination services.  ? ?SDOH (Social Determinants of Health) assessments and interventions performed:  Yes ?SDOH Interventions   ? ?Flowsheet Row Most Recent Value  ?SDOH Interventions   ?Food Insecurity  Interventions Intervention Not Indicated  ?Housing Interventions Intervention Not Indicated  ?Transportation Interventions Intervention Not Indicated  ? ?  ?  ? ?Advanced Directives Status:  Patient has completed forms but has yet to have notarized. She understands they must be notarized in order to be a legal document. ? ?Care Plan ? ?Allergies  ?Allergen Reactions  ? Reglan [Metoclopramide] Shortness Of Breath  ? ? ?Outpatient Encounter Medications as of 01/27/2022  ?Medication Sig Note  ? amLODipine (NORVASC) 5 MG tablet Take 1 tablet (5 mg total) by mouth daily. 01/23/2022: Not needed  ? aspirin EC 81 MG tablet Take 81 mg by mouth every morning. Swallow whole.   ? atorvastatin (LIPITOR) 80 MG tablet Take 1 tablet (80 mg total) by mouth daily. (Patient taking differently: Take 80 mg by mouth every evening.)   ? buPROPion (WELLBUTRIN XL) 300 MG 24 hr tablet Take 1 tablet (300 mg total) by mouth every morning.   ? guanFACINE (TENEX) 2 MG tablet Take 1 tablet by mouth every morning and 1 tablet at bedtime   ? hydrochlorothiazide (HYDRODIURIL) 25 MG tablet TAKE 1 TABLET BY MOUTH ONCE DAILY (Patient taking differently: Take 25 mg by mouth daily.) 01/20/2022: On hold (due to experiencing low bp)  ? levothyroxine (EUTHYROX) 88 MCG tablet TAKE 1 TABLET BY MOUTH ONCE A DAY   ? LORazepam (ATIVAN) 1 MG tablet Take 1 tablet by mouth daily as needed for anxiety (30 day supply) (Patient taking differently: Take 1 mg by mouth at bedtime as needed for sleep or anxiety.) 01/09/2022: Usually night medicine  ? methylphenidate (RITALIN) 20 MG tablet Take 1 tablet by mouth 3 times daily (Patient taking differently: Take 20 mg by mouth See admin  instructions. Take 20 mg by mouth two to three times a day)   ? mirabegron ER (MYRBETRIQ) 50 MG TB24 tablet Take 1 tablet by mouth daily (Patient taking differently: Take 50 mg by mouth at bedtime.)   ? omeprazole (PRILOSEC) 40 MG capsule Take 1 capsule (40 mg total) by mouth 2 (two) times daily.    ? polyethylene glycol (MIRALAX / GLYCOLAX) 17 g packet Take 8.5 g by mouth daily as needed (constipation.).   ? prochlorperazine (COMPAZINE) 5 MG tablet Take 1 tablet (5 mg total) by mouth every 6 (six) hours as needed for nausea or vomiting.   ? sertraline (ZOLOFT) 50 MG tablet Take 1/2 tablet by mouth every morning for 1 week then take 1 tablet every morning (Patient taking differently: Take 50 mg by mouth daily.)   ? traMADol (ULTRAM) 50 MG tablet Take 1-2 tablets (50-100 mg total) by mouth every 6 (six) hours as needed for moderate pain.   ? [DISCONTINUED] estradiol (ESTRACE) 0.5 MG tablet TAKE 1 TABLET BY MOUTH ONCE A DAY (Patient not taking: No sig reported)   ? ?No facility-administered encounter medications on file as of 01/27/2022.  ? ? ?Patient Active Problem List  ? Diagnosis Date Noted  ? Biliary dyskinesia 01/21/2022  ? History of TIA (transient ischemic attack) 01/09/2022  ? GERD (gastroesophageal reflux disease) 01/07/2022  ? Cholelithiasis without cholecystitis 01/07/2022  ? Pyuria 01/06/2022  ? Cervical spondylosis without myelopathy 01/05/2022  ? Constipation by delayed colonic transit 01/05/2022  ? Cervical radiculopathy 01/05/2022  ? Oropharyngeal dysphagia 01/05/2022  ? Degeneration of lumbar intervertebral disc 01/05/2022  ? Hypomagnesemia 01/05/2022  ? Glucose intolerance (impaired glucose tolerance) 01/05/2022  ? Hyperlipidemia, unspecified 12/06/2021  ? Major depressive disorder, single episode, unspecified 12/06/2021  ? Right arm weakness   ? Paresthesia   ? Gastritis 02/04/2021  ? Intractable nausea and vomiting 02/02/2021  ? Prolonged QT interval 02/02/2021  ? High anion gap metabolic acidosis 34/74/2595  ? OSA (obstructive sleep apnea) 09/10/2020  ? TIA (transient ischemic attack) 04/29/2020  ? Essential hypertension 04/29/2020  ? Hormone replacement therapy (HRT) 09/09/2016  ? Hypothyroidism 07/16/2014  ? Sjogren's syndrome ([place]) 04/11/2013  ? History of ovarian cancer 04/11/2013  ?  Personal history of colonic polyps 04/11/2013  ? ADD (attention deficit disorder) 07/06/2012  ? Dysthymia 07/06/2012  ? ? ?Conditions to be addressed/monitored:  Biliary Dyskinesia ? ?There are no care plans that you recently modified to display for this patient. ?  ? ?Follow Up Plan:  No Sw follow up planned at this time. Encouraged the patient to contact me as needed. The patient is scheduled to speak with RN Care Manager 02/17/22. ?     ?Daneen Schick, BSW, CDP ?Social Worker, Certified Dementia Practitioner ? [facility] ?[phone] ? ?   ? ? ?

## 2022-01-29 ENCOUNTER — Encounter: Payer: Self-pay | Admitting: Family Medicine

## 2022-01-29 ENCOUNTER — Ambulatory Visit (HOSPITAL_COMMUNITY): Payer: 59

## 2022-01-29 ENCOUNTER — Ambulatory Visit: Payer: 59 | Admitting: Family Medicine

## 2022-01-29 VITALS — BP 108/68 | HR 77 | Temp 98.2°F | Wt 120.2 lb

## 2022-01-29 DIAGNOSIS — K140 Glossitis: Secondary | ICD-10-CM | POA: Diagnosis not present

## 2022-01-29 DIAGNOSIS — Z9049 Acquired absence of other specified parts of digestive tract: Secondary | ICD-10-CM

## 2022-01-29 NOTE — Progress Notes (Signed)
   Subjective:    Patient ID: Dorothy Wood, female    DOB: 06-17-1961, 61 y.o.   MRN: [id number]  HPI She is here for evaluation of tongue discomfort.  She recently had a laparoscopic cholecystectomy for difficulty with protracted vomiting and also subsequent weight loss.  Review of the record indicates she has lost approximately 10% of her body weight during the same process.  She is being referred to nutritionist to help with weight gain.   Review of Systems     Objective:   Physical Exam Oral examination shows the tongue to be slightly more erythematous than I would expect with some minimal fissuring.  The rest of the oral exam is normal.  Neck is supple without adenopathy.       Assessment & Plan:  Glossitis  History of cholecystectomy I explained that I thought the symptoms were mainly nutrition related Take a multivitamin.  Use a probiotic.  Gargle with what ever feels good

## 2022-01-29 NOTE — Patient Instructions (Signed)
Take a multivitamin.  Use a probiotic.  Gargle with what ever feels good

## 2022-01-30 ENCOUNTER — Other Ambulatory Visit: Payer: Self-pay | Admitting: Medical

## 2022-01-30 ENCOUNTER — Telehealth: Payer: Self-pay | Admitting: Family Medicine

## 2022-01-30 ENCOUNTER — Other Ambulatory Visit (HOSPITAL_COMMUNITY): Payer: Self-pay

## 2022-01-30 MED ORDER — LIDOCAINE VISCOUS HCL 2 % MT SOLN: 5.0000 mL | Freq: Four times a day (QID) | OROMUCOSAL | 0 refills | Status: DC

## 2022-01-30 MED ORDER — LIDOCAINE VISCOUS HCL 2 % MT SOLN
5.0000 mL | Freq: Four times a day (QID) | OROMUCOSAL | 0 refills | Status: DC
  Filled 2022-01-30: qty 360, 18d supply, fill #0

## 2022-01-30 NOTE — Telephone Encounter (Signed)
Pt called and states that at her appt yest [facility] offered her Magic Mouthwash with lidocaine. She is requesting that be sent into [facility] outpatient pharmacy. Sending back to [place] due to [place] out of office.

## 2022-01-30 NOTE — Telephone Encounter (Signed)
Called pt and let her know [facility] was out of the mouthwash and Shane sent to [facility] instead.

## 2022-02-04 ENCOUNTER — Other Ambulatory Visit (HOSPITAL_COMMUNITY): Payer: Self-pay

## 2022-02-13 ENCOUNTER — Other Ambulatory Visit (HOSPITAL_COMMUNITY): Payer: Self-pay

## 2022-02-13 MED ORDER — SERTRALINE HCL 50 MG PO TABS
50.0000 mg | ORAL_TABLET | Freq: Every morning | ORAL | 0 refills | Status: DC
  Filled 2022-02-13: qty 90, 90d supply, fill #0

## 2022-02-17 ENCOUNTER — Telehealth: Payer: 59

## 2022-02-23 ENCOUNTER — Other Ambulatory Visit (HOSPITAL_COMMUNITY): Payer: Self-pay

## 2022-03-04 ENCOUNTER — Telehealth: Payer: Self-pay

## 2022-03-04 ENCOUNTER — Telehealth: Payer: 59

## 2022-03-04 NOTE — Telephone Encounter (Signed)
  Care Management   Follow Up Note   03/04/2022 Name: Dorothy Wood MRN: [id number] DOB: [birth year]   Referred by: Denita Lung, MD Reason for referral : Care Coordination (Initial RN CM outreach )   An unsuccessful telephone outreach was attempted today. The patient was referred to the case management team for assistance with care management and care coordination.   Follow Up Plan: Telephone follow up appointment with care management team member scheduled for: 03/24/22  Barb Merino, RN, BSN, CCM Care Management Coordinator [place] [facility]  Direct Phone: [phone]

## 2022-03-07 ENCOUNTER — Other Ambulatory Visit (HOSPITAL_COMMUNITY): Payer: Self-pay

## 2022-03-12 ENCOUNTER — Encounter: Payer: Self-pay | Admitting: Orthopaedic Surgery

## 2022-03-12 ENCOUNTER — Ambulatory Visit (INDEPENDENT_AMBULATORY_CARE_PROVIDER_SITE_OTHER): Payer: 59 | Admitting: Orthopaedic Surgery

## 2022-03-12 DIAGNOSIS — M65342 Trigger finger, left ring finger: Secondary | ICD-10-CM | POA: Diagnosis not present

## 2022-03-12 NOTE — Progress Notes (Signed)
Office Visit Note   Patient: Dorothy Wood           Date of Birth: Jul 10, 1961           MRN: [id number] Visit Date: 03/12/2022              Requested by: Denita Lung, MD [address] [place],  [place] [address] PCP: Denita Lung, MD   Assessment & Plan: Visit Diagnoses:  1. Trigger finger, left ring finger     Plan: I was able to place a steroid injection at the level of the left ring finger A1 pulley.  She will try Voltaren gel 2-3 times a day on this area.  If it reoccurs the option of the other injection versus outpatient surgery.  I described this as well to her.  All question concerns were answered and addressed.  Follow-Up Instructions: Return if symptoms worsen or fail to improve.   Orders:  No orders of the defined types were placed in this encounter.  No orders of the defined types were placed in this encounter.     Procedures: No procedures performed   Clinical Data: No additional findings.   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  She has been having active triggering of her left ring finger for a while now.  It really gets a lot getting stuck worse in the morning.  She is tired anti-inflammatories and that has not helped.  She was told that it was osteoarthritis but this is classic triggering of what she describes and just even seeing her today.  She is not a diabetic.  HPI  Review of Systems There is no listed fever, chills, nausea, vomiting  Objective: Vital Signs: There were no vitals taken for this visit.  Physical Exam  Ortho Exam Examination of her left hand shows active triggering of the left ring finger.  She is neurovascular intact.  There is pain over the A1 pulley and again active triggering.  There is no swelling.  This does not look like arthritis.  This looks like  classic inflammation at the A1 pulley of the left ring finger. Specialty Comments:  No specialty comments available.  Imaging: No results found.   PMFS History: Patient Active Problem List   Diagnosis Date Noted   Biliary dyskinesia 01/21/2022   History of TIA (transient ischemic attack) 01/09/2022   GERD (gastroesophageal reflux disease) 01/07/2022   Cholelithiasis without cholecystitis 01/07/2022   Pyuria 01/06/2022   Cervical spondylosis without myelopathy 01/05/2022   Constipation by delayed colonic transit 01/05/2022   Cervical radiculopathy 01/05/2022   Oropharyngeal dysphagia 01/05/2022   Degeneration of lumbar intervertebral disc 01/05/2022   Hypomagnesemia 01/05/2022   Glucose intolerance (impaired glucose tolerance) 01/05/2022   Hyperlipidemia, unspecified 12/06/2021   Major depressive disorder, single episode, unspecified 12/06/2021   Right arm weakness    Paresthesia    Gastritis 02/04/2021   Intractable nausea and vomiting 02/02/2021   Prolonged QT interval 02/02/2021   High anion gap metabolic acidosis 73/22/0254   OSA (obstructive sleep apnea) 09/10/2020   TIA (transient ischemic attack) 04/29/2020   Essential hypertension 04/29/2020   Hormone replacement therapy (HRT) 09/09/2016   Hypothyroidism 07/16/2014   Sjogren's syndrome ([place]) 04/11/2013   History of ovarian cancer 04/11/2013   Personal history of colonic polyps 04/11/2013  ADD (attention deficit disorder) 07/06/2012   Dysthymia 07/06/2012   Past Medical History:  Diagnosis Date   Arthritis    Cancer ([place])    OVARIAN   Colonic polyp    Depression    Gastritis    Hyperlipidemia    Hypertension    Hypothyroidism    Sjogren's disease ([place])    Stroke ([place])    Thyroid disease    HYPOTHYROIDISM   TIA (transient ischemic attack) 04/06/2021    Family History  Problem Relation Age of Onset   Mental illness Mother    Hypertension Mother    Heart failure Mother    CAD Father        CABG at  age 34   Mental illness Sister    Alcoholism Sister    Mental illness Brother    Alcoholism Brother    Mental illness Maternal Uncle    Mental illness Maternal Grandmother    Cancer Paternal Grandmother     Past Surgical History:  Procedure Laterality Date   ABDOMINAL HYSTERECTOMY     Ovarian cancer   BIOPSY  02/04/2021   Procedure: BIOPSY;  Surgeon: Ronald Lobo, MD;  Location: [facility];  Service: Endoscopy;;   BREAST CYST ASPIRATION     CESAREAN SECTION     x2   CHOLECYSTECTOMY N/A 01/23/2022   Procedure: LAPAROSCOPIC CHOLECYSTECTOMY WITH INTRAOPERATIVE CHOLANGIOGRAM;  Surgeon: Armandina Gemma, MD;  Location: [facility]S;  Service: General;  Laterality: N/A;   COLONOSCOPY  10-06   Dr. Earnest Bailey   ESOPHAGOGASTRODUODENOSCOPY N/A 02/04/2021   Procedure: ESOPHAGOGASTRODUODENOSCOPY (EGD);  Surgeon: Ronald Lobo, MD;  Location: [facility];  Service: Endoscopy;  Laterality: N/A;   TONSILLECTOMY     Social History   Occupational History   Not on file  Tobacco Use   Smoking status: Never   Smokeless tobacco: Never  Vaping Use   Vaping Use: Never used  Substance and Sexual Activity   Alcohol use: Yes    Alcohol/week: 4.0 standard drinks of alcohol    Types: 4 Glasses of wine per week    Comment: 05/21/21 1 glasses wine 3 x week   Drug use: No   Sexual activity: Not Currently

## 2022-03-19 ENCOUNTER — Other Ambulatory Visit (HOSPITAL_COMMUNITY): Payer: Self-pay

## 2022-03-19 ENCOUNTER — Encounter: Payer: Self-pay | Admitting: Family Medicine

## 2022-03-24 ENCOUNTER — Telehealth: Payer: 59

## 2022-03-25 ENCOUNTER — Other Ambulatory Visit (HOSPITAL_COMMUNITY): Payer: Self-pay

## 2022-03-25 MED ORDER — METHYLPHENIDATE HCL 20 MG PO TABS
ORAL_TABLET | ORAL | 0 refills | Status: DC
  Filled 2022-03-25: qty 270, 90d supply, fill #0

## 2022-03-26 ENCOUNTER — Other Ambulatory Visit: Payer: Self-pay | Admitting: Family Medicine

## 2022-03-26 ENCOUNTER — Other Ambulatory Visit (HOSPITAL_COMMUNITY): Payer: Self-pay

## 2022-03-26 DIAGNOSIS — M3500 Sicca syndrome, unspecified: Secondary | ICD-10-CM

## 2022-03-26 MED ORDER — PILOCARPINE HCL 5 MG PO TABS
ORAL_TABLET | Freq: Four times a day (QID) | ORAL | 1 refills | Status: DC
  Filled 2022-03-26: qty 360, 90d supply, fill #0
  Filled 2022-07-29: qty 360, 90d supply, fill #1

## 2022-03-27 ENCOUNTER — Other Ambulatory Visit (HOSPITAL_COMMUNITY): Payer: Self-pay

## 2022-03-30 NOTE — Progress Notes (Deleted)
HPI: FU hypertension and family h/o CAD.  Calcium score February 2021 0.  Brain MRI/MRA August 2021 normal.  Echocardiogram July 2022 showed normal LV function.  Saline microcavitation study negative.  Since last seen,  Current Outpatient Medications  Medication Sig Dispense Refill   amLODipine (NORVASC) 5 MG tablet Take 1 tablet (5 mg total) by mouth daily. (Patient not taking: Reported on 01/29/2022) 30 tablet 0   aspirin EC 81 MG tablet Take 81 mg by mouth every morning. Swallow whole.     atorvastatin (LIPITOR) 80 MG tablet Take 1 tablet (80 mg total) by mouth daily. 90 tablet 0   buPROPion (WELLBUTRIN XL) 300 MG 24 hr tablet Take 1 tablet (300 mg total) by mouth every morning. 90 tablet 1   guanFACINE (TENEX) 2 MG tablet Take 1 tablet by mouth every morning and 1 tablet at bedtime 180 tablet 1   hydrochlorothiazide (HYDRODIURIL) 25 MG tablet TAKE 1 TABLET BY MOUTH ONCE DAILY (Patient not taking: Reported on 01/29/2022) 90 tablet 2   levothyroxine (EUTHYROX) 88 MCG tablet TAKE 1 TABLET BY MOUTH ONCE A DAY 90 tablet 3   LORazepam (ATIVAN) 1 MG tablet Take 1 tablet by mouth daily as needed for anxiety (30 day supply) (Patient taking differently: Take 1 mg by mouth at bedtime as needed for sleep or anxiety.) 30 tablet 1   magic mouthwash (lidocaine, diphenhydrAMINE, alum & mag hydroxide) suspension Swish and spit 5 mLs 4 (four) times daily. 360 mL 0   methylphenidate (RITALIN) 20 MG tablet Take 1 tablet by mouth 3 times daily (Patient taking differently: Take 20 mg by mouth See admin instructions. Take 20 mg by mouth two to three times a day) 270 tablet 0   methylphenidate (RITALIN) 20 MG tablet Take 1 tablet by mouth three times daily 270 tablet 0   mirabegron ER (MYRBETRIQ) 50 MG TB24 tablet Take 1 tablet by mouth daily (Patient taking differently: Take 50 mg by mouth at bedtime.) 30 tablet 6   omeprazole (PRILOSEC) 40 MG capsule Take 1 capsule (40 mg total) by mouth 2 (two) times daily.  60 capsule 0   pilocarpine (SALAGEN) 5 MG tablet TAKE 1 TABLET BY MOUTH 4 TIMES DAILY 360 tablet 1   polyethylene glycol (MIRALAX / GLYCOLAX) 17 g packet Take 8.5 g by mouth daily as needed (constipation.).     prochlorperazine (COMPAZINE) 5 MG tablet Take 1 tablet (5 mg total) by mouth every 6 (six) hours as needed for nausea or vomiting. (Patient not taking: Reported on 01/29/2022) 30 tablet 0   sertraline (ZOLOFT) 50 MG tablet Take 1/2 tablet by mouth every morning for 1 week then take 1 tablet every morning (Patient taking differently: Take 50 mg by mouth daily.) 30 tablet 1   sertraline (ZOLOFT) 50 MG tablet Take 1 tablet (50 mg total) by mouth every morning. 90 tablet 0   traMADol (ULTRAM) 50 MG tablet Take 1-2 tablets (50-100 mg total) by mouth every 6 (six) hours as needed for moderate pain. (Patient not taking: Reported on 01/29/2022) 15 tablet 0   No current facility-administered medications for this visit.     Past Medical History:  Diagnosis Date   Arthritis    Cancer ([place])    OVARIAN   Colonic polyp    Depression    Gastritis    Hyperlipidemia    Hypertension    Hypothyroidism    Sjogren's disease ([place])    Stroke [facility])    Thyroid disease  HYPOTHYROIDISM   TIA (transient ischemic attack) 04/06/2021    Past Surgical History:  Procedure Laterality Date   ABDOMINAL HYSTERECTOMY     Ovarian cancer   BIOPSY  02/04/2021   Procedure: BIOPSY;  Surgeon: Ronald Lobo, MD;  Location: [facility];  Service: Endoscopy;;   BREAST CYST ASPIRATION     CESAREAN SECTION     x2   CHOLECYSTECTOMY N/A 01/23/2022   Procedure: LAPAROSCOPIC CHOLECYSTECTOMY WITH INTRAOPERATIVE CHOLANGIOGRAM;  Surgeon: Armandina Gemma, MD;  Location: [facility];  Service: General;  Laterality: N/A;   COLONOSCOPY  10-06   Dr. Earnest Bailey   ESOPHAGOGASTRODUODENOSCOPY N/A 02/04/2021   Procedure: ESOPHAGOGASTRODUODENOSCOPY (EGD);  Surgeon: Ronald Lobo, MD;  Location: [facility];  Service: Endoscopy;   Laterality: N/A;   TONSILLECTOMY      Social History   Socioeconomic History   Marital status: Married    Spouse name: Not on file   Number of children: 2   Years of education: Not on file   Highest education level: Not on file  Occupational History   Not on file  Tobacco Use   Smoking status: Never   Smokeless tobacco: Never  Vaping Use   Vaping Use: Never used  Substance and Sexual Activity   Alcohol use: Yes    Alcohol/week: 4.0 standard drinks of alcohol    Types: 4 Glasses of wine per week    Comment: 05/21/21 1 glasses wine 3 x week   Drug use: No   Sexual activity: Not Currently  Other Topics Concern   Not on file  Social History Narrative   Lives with spouse   Social Determinants of Health   Financial Resource Strain: Not on file  Food Insecurity: No Food Insecurity (01/27/2022)   Hunger Vital Sign    Worried About Running Out of Food in the Last Year: Never true    Ran Out of Food in the Last Year: Never true  Transportation Needs: No Transportation Needs (01/27/2022)   PRAPARE - Hydrologist (Medical): No    Lack of Transportation (Non-Medical): No  Physical Activity: Not on file  Stress: Not on file  Social Connections: Not on file  Intimate Partner Violence: Not on file    Family History  Problem Relation Age of Onset   Mental illness Mother    Hypertension Mother    Heart failure Mother    CAD Father        CABG at age 24   Mental illness Sister    Alcoholism Sister    Mental illness Brother    Alcoholism Brother    Mental illness Maternal Uncle    Mental illness Maternal Grandmother    Cancer Paternal Grandmother     ROS: no fevers or chills, productive cough, hemoptysis, dysphasia, odynophagia, melena, hematochezia, dysuria, hematuria, rash, seizure activity, orthopnea, PND, pedal edema, claudication. Remaining systems are negative.  Physical Exam: Well-developed well-nourished in no acute distress.  Skin is  warm and dry.  HEENT is normal.  Neck is supple.  Chest is clear to auscultation with normal expansion.  Cardiovascular exam is regular rate and rhythm.  Abdominal exam nontender or distended. No masses palpated. Extremities show no edema. neuro grossly intact  ECG- personally reviewed  A/P  1 hypertension-blood pressure controlled.  Continue present medical regimen.  2 hyperlipidemia-continue statin.  3 family history of coronary disease-previous calcium score 0 and patient denies chest pain.  Kirk Ruths, MD

## 2022-04-02 ENCOUNTER — Telehealth: Payer: Self-pay

## 2022-04-02 ENCOUNTER — Other Ambulatory Visit (HOSPITAL_COMMUNITY): Payer: Self-pay

## 2022-04-02 ENCOUNTER — Ambulatory Visit (INDEPENDENT_AMBULATORY_CARE_PROVIDER_SITE_OTHER): Payer: 59 | Admitting: Family

## 2022-04-02 ENCOUNTER — Encounter: Payer: 59 | Attending: Family Medicine | Admitting: Skilled Nursing Facility1

## 2022-04-02 ENCOUNTER — Encounter (HOSPITAL_BASED_OUTPATIENT_CLINIC_OR_DEPARTMENT_OTHER): Payer: Self-pay | Admitting: Family

## 2022-04-02 ENCOUNTER — Encounter: Payer: Self-pay | Admitting: Skilled Nursing Facility1

## 2022-04-02 ENCOUNTER — Telehealth: Payer: 59

## 2022-04-02 VITALS — BP 134/80 | HR 83 | Ht 65.0 in | Wt 118.0 lb

## 2022-04-02 DIAGNOSIS — I10 Essential (primary) hypertension: Secondary | ICD-10-CM | POA: Diagnosis not present

## 2022-04-02 DIAGNOSIS — R7302 Impaired glucose tolerance (oral): Secondary | ICD-10-CM | POA: Diagnosis not present

## 2022-04-02 DIAGNOSIS — Z8673 Personal history of transient ischemic attack (TIA), and cerebral infarction without residual deficits: Secondary | ICD-10-CM | POA: Diagnosis not present

## 2022-04-02 DIAGNOSIS — E782 Mixed hyperlipidemia: Secondary | ICD-10-CM

## 2022-04-02 DIAGNOSIS — Z713 Dietary counseling and surveillance: Secondary | ICD-10-CM | POA: Insufficient documentation

## 2022-04-02 NOTE — Patient Instructions (Addendum)
Medication Instructions:  Continue your current medications.   No Amlodipine, Hydrochlorothiazide at this time. If your blood pressure starts to run consistently more than 130/80 and we may consider resuming Hydrochlorothiazide.  *If you need a refill on your cardiac medications before your next appointment, please call your pharmacy*   Lab Work: Your physician recommends that you return for lab work at your convenience for fasting lipid panel, CMP  Please return for Lab work. You may come to the...   [facility] (3rd floor) [address], [place], [place] [address]  Open: 8am-Noon and 1pm-4:30pm  Please ring the doorbell on the small table when you exit the elevator and the Lab Tech will come get you  [place] [facility] [address] [place], [place], [place] [address] Open: 8am-1pm, then 2pm-4:30pm   [place]- Please see attached locations sheet stapled to your lab work with address and hours.    If you have labs (blood work) drawn today and your tests are completely normal, you will receive your results only by: [place] (if you have MyChart) OR A paper copy in the mail If you have any lab test that is abnormal or we need to change your treatment, we will call you to review the results.   Testing/Procedures: Your physician has requested that you have a carotid duplex. This test is an ultrasound of the carotid arteries in your neck. It looks at blood flow through these arteries that supply the brain with blood. Allow one hour for this exam. There are no restrictions or special instructions.   Follow-Up: At [facility], you and your health needs are our priority.  As part of our continuing mission to provide you with exceptional heart care, we have created designated Provider Care Teams.  These Care Teams include your primary Cardiologist (physician) and Advanced Practice Providers (APPs -  Physician Assistants and Nurse  Practitioners) who all work together to provide you with the care you need, when you need it.  We recommend signing up for the patient portal called "MyChart".  Sign up information is provided on this After Visit Summary.  MyChart is used to connect with patients for Virtual Visits (Telemedicine).  Patients are able to view lab/test results, encounter notes, upcoming appointments, etc.  Non-urgent messages can be sent to your provider as well.   To learn more about what you can do with MyChart, go to [email or web address].    Your next appointment:   In December as scheduled with Dr. Stanford Breed   Other Instructions  Heart Healthy Diet Recommendations: A low-salt diet is recommended. Meats should be grilled, baked, or boiled. Avoid fried foods. Focus on lean protein sources like fish or chicken with vegetables and fruits. The American Heart Association is a Microbiologist!  American Heart Association Diet and Lifeystyle Recommendations   Exercise recommendations: The American Heart Association recommends 150 minutes of moderate intensity exercise weekly. Try 30 minutes of moderate intensity exercise 4-5 times per week. This could include walking, jogging, or swimming.   Important Information About Sugar

## 2022-04-02 NOTE — Telephone Encounter (Signed)
  Care Management   Follow Up Note   04/02/2022 Name: Dorothy Wood MRN: [id number] DOB: 08/05/1961  Referred by: Denita Lung, MD Reason for referral : Care Coordination (Initial RN CC Outreach )   A second unsuccessful telephone outreach was attempted today. The patient was referred to the case management team for assistance with care management and care coordination.   Follow Up Plan: No further follow up required  Barb Merino, RN, BSN, CCM Care Management Coordinator [facility] [facility]  Direct Phone: [phone]

## 2022-04-02 NOTE — Progress Notes (Signed)
Office Visit    Patient Name: Dorothy Wood Date of Encounter: 04/02/2022  PCP:  Denita Lung, MD   [place]  Cardiologist:  Kirk Ruths, MD  Advanced Practice Provider:  No care team member to display Electrophysiologist:  None      Chief Complaint    Dorothy Wood is a 61 y.o. female presents today for cardiology follow-up  Past Medical History    Past Medical History:  Diagnosis Date   Arthritis    Cancer ([place])    OVARIAN   Colonic polyp    Depression    Gastritis    Hyperlipidemia    Hypertension    Hypothyroidism    Sjogren's disease ([place])    Stroke ([place])    Thyroid disease    HYPOTHYROIDISM   TIA (transient ischemic attack) 04/06/2021   Past Surgical History:  Procedure Laterality Date   ABDOMINAL HYSTERECTOMY     Ovarian cancer   BIOPSY  02/04/2021   Procedure: BIOPSY;  Surgeon: Ronald Lobo, MD;  [facility];  Service: Endoscopy;;   BREAST CYST ASPIRATION     CESAREAN SECTION     x2   CHOLECYSTECTOMY N/A 01/23/2022   Procedure: LAPAROSCOPIC CHOLECYSTECTOMY WITH INTRAOPERATIVE CHOLANGIOGRAM;  Surgeon: Armandina Gemma, MD;  [facility];  Service: General;  Laterality: N/A;   COLONOSCOPY  10-06   Dr. Earnest Bailey   ESOPHAGOGASTRODUODENOSCOPY N/A 02/04/2021   Procedure: ESOPHAGOGASTRODUODENOSCOPY (EGD);  Surgeon: Ronald Lobo, MD;  Location: [facility];  Service: Endoscopy;  Laterality: N/A;   TONSILLECTOMY      Allergies  Allergies  Allergen Reactions   Reglan [Metoclopramide] Shortness Of Breath    History of Present Illness    Dorothy Wood is a 61 y.o. female with a hx of hypertension, hyperlipidemia, aortic atherosclerosis, TIA last seen 11/05/2020 by Dr. Stanford Breed.  Family history notable for coronary disease.  Prior coronary calcium score of 0 in 10/2019.  Admitted 4/24 - 01/07/2022 with nausea vomiting.  She was discharged on omeprazole as well as Compazine.  Noted cholelithiasis without  cholecystitis and was recommended for outpatient surgical follow-up.  Readmitted 4/28 - 01/13/2022 with nausea due to severe GERD.  On 01/23/2022 underwent laparoscopic cholecystectomy.  She presents today for follow-up independently.  Shares with me that her daughter is having her first grandson soon and she is very excited.  Planning to travel to [place] to visit.  Notes she has lost about 20 pounds with her recent admissions.  She has not been taking hydrochlorothiazide as she had systolic blood pressure as low as 70 associated with lightheadedness.  Over the past few weeks her blood pressure has routinely been 90s-110s/60s without lightheadedness, dizziness.  No formal exercise routine and interested in increasing her activity.  She has a meeting with a nutritionist this afternoon to try and help regain some of the weight she has lost.  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  EKGs/Labs/Other Studies Reviewed:   The following studies were reviewed today:   EKG: No EKG today  Recent Labs: 04/07/2021: TSH 1.236 01/09/2022: Magnesium 1.8 01/10/2022: ALT 31; BUN 10; Creatinine, Ser 0.53; Hemoglobin 14.3; Platelets 281; Potassium 3.5; Sodium 133  Recent Lipid Panel    Component Value Date/Time   CHOL 195 04/07/2021 0255   CHOL 171 07/22/2020 1341   TRIG 193 (H) 04/07/2021 0255   HDL 69 04/07/2021 0255   HDL 65 07/22/2020 1341   CHOLHDL 2.8 04/07/2021 0255  VLDL 39 04/07/2021 0255   LDLCALC 87 04/07/2021 0255   LDLCALC 76 07/22/2020 1341   LDLDIRECT 122.2 (H) 04/06/2021 1909   Home Medications   Current Meds  Medication Sig   aspirin EC 81 MG tablet Take 81 mg by mouth every morning. Swallow whole.   atorvastatin (LIPITOR) 80 MG tablet Take 1 tablet (80 mg total) by mouth daily.   buPROPion (WELLBUTRIN XL) 300 MG 24 hr tablet Take 1 tablet (300 mg total) by mouth every morning.   guanFACINE (TENEX) 2 MG tablet Take 1 tablet by mouth every morning and 1 tablet at bedtime    levothyroxine (EUTHYROX) 88 MCG tablet TAKE 1 TABLET BY MOUTH ONCE A DAY   LORazepam (ATIVAN) 1 MG tablet Take 1 tablet by mouth daily as needed for anxiety (30 day supply) (Patient taking differently: Take 1 mg by mouth at bedtime as needed for sleep or anxiety.)   methylphenidate (RITALIN) 20 MG tablet Take 1 tablet by mouth 3 times daily (Patient taking differently: Take 20 mg by mouth See admin instructions. Take 20 mg by mouth two to three times a day)   pilocarpine (SALAGEN) 5 MG tablet TAKE 1 TABLET BY MOUTH 4 TIMES DAILY   polyethylene glycol (MIRALAX / GLYCOLAX) 17 g packet Take 8.5 g by mouth daily as needed (constipation.).   sertraline (ZOLOFT) 50 MG tablet Take 1/2 tablet by mouth every morning for 1 week then take 1 tablet every morning (Patient taking differently: Take 50 mg by mouth daily.)    Review of Systems      All other systems reviewed and are otherwise negative except as noted above.  Physical Exam    VS:  BP 134/80   Pulse 83   Ht '5\' 5"'$  (1.651 m)   Wt 118 lb (53.5 kg)   BMI 19.64 kg/m  , BMI Body mass index is 19.64 kg/m.  Wt Readings from Last 3 Encounters:  04/02/22 118 lb (53.5 kg)  01/29/22 120 lb 3.2 oz (54.5 kg)  01/23/22 115 lb (52.2 kg)     GEN: Well nourished, well developed, in no acute distress. HEENT: normal. Neck: Supple, no JVD, carotid bruits, or masses. Cardiac: RRR, no murmurs, rubs, or gallops. No clubbing, cyanosis, edema.  Radials/PT 2+ and equal bilaterally.  Respiratory:  Respirations regular and unlabored, clear to auscultation bilaterally. GI: Soft, nontender, nondistended. MS: No deformity or atrophy. Skin: Warm and dry, no rash. Neuro:  Strength and sensation are intact. Psych: Normal affect.  Assessment & Plan    Hypertension -BP at goal of less than 130/80 at home on no antihypertensive therapy.  Does note 20 pound weight loss over the past few months in setting multiple hospitalizations for nausea, vomiting due to severe  GERD.  Her appetite and symptoms are better since cholecystectomy.  She will remain off hydrochlorothiazide but this could be resumed if BP consistently greater than 130/80.  Hyperlipidemia / TIA / Aortic atherosclerosis - LDL goal < 70.  Atorvastatin was increased to 80 mg daily during her hospitalizations this year.  We will update fasting lipid panel, LFTs.  If LDL greater than 70 consider addition of Zetia and/or bempedoic acid. Carotid duplex to rule out significant carotid stenosis given history of medical sclerosis, TIA and family history of carotid artery disease.  Family history of coronary disease - 10/2019 coronary calcium score of 0. Stable with no anginal symptoms. No indication for ischemic evaluation.     Disposition: Follow up in 6 month(s) with  Kirk Ruths, MD or APP.  Signed, Loel Dubonnet, NP 04/02/2022, 10:45 AM [place]

## 2022-04-02 NOTE — Progress Notes (Signed)
Medical Nutrition Therapy   Primary concerns today: unintentional weight loss  Referral diagnosis: r73.02    NUTRITION ASSESSMENT   Clinical Medical Hx: depression, TIA times 2 Medications: buproprion, levothyroxin, omeprezole, lipitor, ritulan, cirtulin Labs:  Notable Signs/Symptoms: none stated  Lifestyle & Dietary Hx   Body Composition Scale 04/02/2022  Current Body Weight 120  Total Body Fat % 22.1  Visceral Fat 4  Fat-Free Mass % 77.8   Total Body Water % 53.4  Muscle-Mass lbs 28.8  BMI 19.6  Body Fat Displacement          Torso  lbs 16.3         Left Leg  lbs 3.2         Right Leg  lbs 3.2         Left Arm  lbs 1.6         Right Arm   lbs 1.6    Pt arrives with her supportive daughter.  Pt has a hx of TIA X's2. Pt states she feels her depression is well managed.   Pt states she was in the [facility] for vomiting for about 3 weeks and then got her gallbladder out and lost 15 pounds resulting in malnutrition. Pt states she had a deep grooved painful tongue.  Pt states she lost a lot of muscle.  Pt states she is going to start a Muscle building program through [facility]. Pt states she feels weak and tired and would like to build muscle.     Estimated daily fluid intake:  oz Supplements:  Sleep:  Stress / self-care: high stress level low self care Current average weekly physical activity:   24-Hr Dietary Recall First Meal: ensure Snack:  Second Meal: ensure Snack:  Third Meal: ensure Snack:  Beverages: ensure, water  Estimated Energy Needs Calories: 1800-2000 for weight gain   NUTRITION DIAGNOSIS  NB-1.1 Food and nutrition-related knowledge deficit As related to no prior nutrition education from a professional.  As evidenced by stating she "only needs ensure and that is the healthiest diet she could have".   NUTRITION INTERVENTION  Nutrition education (E-1) on the following topics:  Necessity of actually chewing her foods FDA not regulating supplements Ensures  role in a healthy diet Appropriate weight for her frame Meal consumption resulting in a  15 pound weight gain Behaviors and relationship with food How to make these changes advised   Handouts Provided Include  Detailed MyPlate  Learning Style & Readiness for Change Teaching method utilized: Visual & Auditory  Demonstrated degree of understanding via: Teach Back  Barriers to learning/adherence to lifestyle change: stressful life  Goals Established by Pt Eat 3 solid meals a day Use the ensure as snacks if you want them Aim for a 15 pound weight gain Think about how to reduce work hours Ask your family for help Try batch cooking in your crockpot soups or chilies Try making mayo based salads to make sandwiches and have non starchy vegetables or fruit as a side Breakfast Burritos Mayo based salads: tuna, chicken, egg salad on 2 slices bread and a veggie side Beans and rice and some kind of non-starchy vegetables  Keep the ensure as a snack Don't forget fruit and have nuts and nut butters with it Yogurt is a great snack! Batch cookie large crockpots of soups or chili Milk is good to drink Nuts are a great snack Get th nature made multivitamin with iron   MONITORING & EVALUATION Dietary intake, weekly physical activity  Next Steps  Patient is to call or email with any questions or concerns.

## 2022-04-03 ENCOUNTER — Telehealth: Payer: Self-pay

## 2022-04-03 ENCOUNTER — Other Ambulatory Visit (HOSPITAL_COMMUNITY): Payer: Self-pay

## 2022-04-03 MED ORDER — LORAZEPAM 1 MG PO TABS
ORAL_TABLET | ORAL | 0 refills | Status: DC
  Filled 2022-04-03: qty 30, 30d supply, fill #0

## 2022-04-03 NOTE — Telephone Encounter (Signed)
Called re: PREP program referral, left voicemail  

## 2022-04-06 ENCOUNTER — Other Ambulatory Visit (HOSPITAL_COMMUNITY): Payer: Self-pay

## 2022-04-09 ENCOUNTER — Ambulatory Visit: Payer: 59 | Admitting: Cardiology

## 2022-04-17 ENCOUNTER — Telehealth: Payer: Self-pay

## 2022-04-17 NOTE — Telephone Encounter (Signed)
She had called and left a voicemail with me, I returned her call and left another voicemail for her.

## 2022-04-20 NOTE — Progress Notes (Signed)
She returned my call, wants to attend PREP, will need next evening classes at [employer]; will ask Pam RN [facility] contact her with schedule for upcoming classes at [facility]

## 2022-04-21 ENCOUNTER — Other Ambulatory Visit (HOSPITAL_COMMUNITY): Payer: Self-pay

## 2022-04-22 ENCOUNTER — Other Ambulatory Visit (HOSPITAL_COMMUNITY): Payer: Self-pay

## 2022-04-22 ENCOUNTER — Other Ambulatory Visit: Payer: Self-pay | Admitting: Family Medicine

## 2022-04-22 MED ORDER — ATORVASTATIN CALCIUM 80 MG PO TABS
80.0000 mg | ORAL_TABLET | Freq: Every day | ORAL | 0 refills | Status: DC
  Filled 2022-04-22: qty 90, 90d supply, fill #0

## 2022-04-23 ENCOUNTER — Other Ambulatory Visit (HOSPITAL_COMMUNITY): Payer: Self-pay

## 2022-04-29 ENCOUNTER — Ambulatory Visit (HOSPITAL_COMMUNITY)
Admission: RE | Admit: 2022-04-29 | Discharge: 2022-04-29 | Disposition: A | Discharge status: Home / Self Care | Payer: 59 | Source: Ambulatory Visit | Attending: Internal Medicine | Admitting: Internal Medicine

## 2022-04-29 ENCOUNTER — Other Ambulatory Visit (HOSPITAL_COMMUNITY): Payer: Self-pay

## 2022-04-29 DIAGNOSIS — I10 Essential (primary) hypertension: Secondary | ICD-10-CM | POA: Diagnosis not present

## 2022-04-29 DIAGNOSIS — E782 Mixed hyperlipidemia: Secondary | ICD-10-CM | POA: Insufficient documentation

## 2022-04-29 DIAGNOSIS — Z8673 Personal history of transient ischemic attack (TIA), and cerebral infarction without residual deficits: Secondary | ICD-10-CM | POA: Insufficient documentation

## 2022-04-29 LAB — COMPREHENSIVE METABOLIC PANEL
ALT: 35 IU/L — ABNORMAL HIGH (ref 0–32)
AST: 30 IU/L (ref 0–40)
Albumin/Globulin Ratio: 2.6 — ABNORMAL HIGH (ref 1.2–2.2)
Albumin: 5 g/dL — ABNORMAL HIGH (ref 3.9–4.9)
Alkaline Phosphatase: 115 IU/L (ref 44–121)
BUN/Creatinine Ratio: 16 (ref 12–28)
BUN: 13 mg/dL (ref 8–27)
Bilirubin Total: 0.4 mg/dL (ref 0.0–1.2)
CO2: 27 mmol/L (ref 20–29)
Calcium: 9.9 mg/dL (ref 8.7–10.3)
Chloride: 100 mmol/L (ref 96–106)
Creatinine, Ser: 0.82 mg/dL (ref 0.57–1.00)
Globulin, Total: 1.9 g/dL (ref 1.5–4.5)
Glucose: 94 mg/dL (ref 70–99)
Potassium: 4.5 mmol/L (ref 3.5–5.2)
Sodium: 140 mmol/L (ref 134–144)
Total Protein: 6.9 g/dL (ref 6.0–8.5)
eGFR: 81 mL/min/{1.73_m2} (ref >59)

## 2022-04-29 LAB — LIPID PANEL
Chol/HDL Ratio: 2.4 ratio (ref 0.0–4.4)
Cholesterol, Total: 180 mg/dL (ref 100–199)
HDL: 75 mg/dL (ref >39)
LDL Chol Calc (NIH): 85 mg/dL (ref 0–99)
Triglycerides: 116 mg/dL (ref 0–149)
VLDL Cholesterol Cal: 20 mg/dL (ref 5–40)

## 2022-04-29 MED ORDER — LORAZEPAM 1 MG PO TABS
ORAL_TABLET | ORAL | 0 refills | Status: DC
  Filled 2022-05-12: qty 30, 30d supply, fill #0

## 2022-05-01 ENCOUNTER — Other Ambulatory Visit (HOSPITAL_COMMUNITY): Payer: Self-pay

## 2022-05-01 ENCOUNTER — Telehealth (HOSPITAL_BASED_OUTPATIENT_CLINIC_OR_DEPARTMENT_OTHER): Payer: Self-pay

## 2022-05-01 DIAGNOSIS — E782 Mixed hyperlipidemia: Secondary | ICD-10-CM

## 2022-05-01 MED ORDER — EZETIMIBE 10 MG PO TABS
10.0000 mg | ORAL_TABLET | Freq: Every day | ORAL | 3 refills | Status: DC
  Filled 2022-05-01: qty 90, 90d supply, fill #0
  Filled 2022-08-27: qty 90, 90d supply, fill #1
  Filled 2022-12-09 (×2): qty 90, 90d supply, fill #2
  Filled 2023-03-10: qty 90, 90d supply, fill #3

## 2022-05-01 NOTE — Telephone Encounter (Addendum)
Results called to patient who verbalizes understanding!       ----- Message from Loel Dubonnet, NP sent at 04/29/2022  4:55 PM EDT ----- LDL (bad cholesterol) 85 with goal <70. Add Zetia '10mg'$  daily. FLP/LFT in 2 months. Normal kidneys, electrolytes. One of two liver enzymes mildly elevated, not of concern.   Loel Dubonnet, NP  Gerald Stabs, RN No significant carotid plaque. Great result!

## 2022-05-12 ENCOUNTER — Other Ambulatory Visit (HOSPITAL_COMMUNITY): Payer: Self-pay

## 2022-05-18 ENCOUNTER — Encounter: Payer: Self-pay | Admitting: Adult Health

## 2022-05-19 NOTE — Telephone Encounter (Signed)
That is correct. Initial eval with PCP first and if needed, will need referral from PCP for this to be further evaluated in our office with one of the MDs.  Thank you.

## 2022-05-20 ENCOUNTER — Ambulatory Visit (INDEPENDENT_AMBULATORY_CARE_PROVIDER_SITE_OTHER): Payer: 59 | Admitting: Family Medicine

## 2022-05-20 ENCOUNTER — Encounter: Payer: Self-pay | Admitting: Family Medicine

## 2022-05-20 ENCOUNTER — Encounter: Payer: Self-pay | Admitting: Internal Medicine

## 2022-05-20 VITALS — BP 110/70 | HR 64 | Temp 99.0°F | Wt 124.6 lb

## 2022-05-20 DIAGNOSIS — M65341 Trigger finger, right ring finger: Secondary | ICD-10-CM

## 2022-05-20 DIAGNOSIS — Z23 Encounter for immunization: Secondary | ICD-10-CM | POA: Diagnosis not present

## 2022-05-20 DIAGNOSIS — R251 Tremor, unspecified: Secondary | ICD-10-CM | POA: Diagnosis not present

## 2022-05-20 DIAGNOSIS — E039 Hypothyroidism, unspecified: Secondary | ICD-10-CM

## 2022-05-20 NOTE — Progress Notes (Signed)
   Subjective:    Patient ID: [place], female    DOB: 03-25-1961, 61 y.o.   MRN: [id number]  HPI She complains of a 4-week history of difficulty with hand tremor.  She is on thyroid and has been on that for several years given to her by her gynecologist.  She is now on 88 mcg.  She states it is the same manufacturer.  No other medications are new.  She has had no skin or hair changes or heat intolerance.  She recently started taking Zetia however her symptoms started before that. She also is having difficulty with right trigger finger.  She has had that done on the opposite side.  Review of Systems     Objective:   Physical Exam Alert and in no distress.  No thyromegaly is noted.  Skin appears normal.  DTRs are 3to 4+       Assessment & Plan:  Hypothyroidism, unspecified type - Plan: Thyroid Panel With TSH  Tremor - Plan: Thyroid Panel With TSH  Need for shingles vaccine - Plan: Varicella-zoster vaccine IM  Trigger ring finger of right hand Her symptoms certainly sound like hyperthyroid but no good reason to have that .  I will wait for the results.  Might need to look at her thyroid gland pending blood results.  She will set up her own appointment to see orthopedics.

## 2022-05-21 ENCOUNTER — Encounter: Payer: Self-pay | Admitting: Family Medicine

## 2022-05-21 ENCOUNTER — Other Ambulatory Visit: Payer: Self-pay

## 2022-05-21 DIAGNOSIS — Z8639 Personal history of other endocrine, nutritional and metabolic disease: Secondary | ICD-10-CM

## 2022-05-21 DIAGNOSIS — R251 Tremor, unspecified: Secondary | ICD-10-CM

## 2022-05-21 LAB — THYROID PANEL WITH TSH
Free Thyroxine Index: 1.8 (ref 1.2–4.9)
T3 Uptake Ratio: 24 % (ref 24–39)
T4, Total: 7.6 ug/dL (ref 4.5–12.0)
TSH: 2.18 u[IU]/mL (ref 0.450–4.500)

## 2022-05-21 NOTE — Addendum Note (Signed)
Addended by: Denita Lung on: 05/21/2022 08:17 AM   Modules accepted: Orders

## 2022-05-22 ENCOUNTER — Other Ambulatory Visit: Payer: 59

## 2022-05-22 ENCOUNTER — Other Ambulatory Visit (HOSPITAL_COMMUNITY): Payer: Self-pay

## 2022-05-22 DIAGNOSIS — R251 Tremor, unspecified: Secondary | ICD-10-CM | POA: Diagnosis not present

## 2022-05-22 DIAGNOSIS — Z8639 Personal history of other endocrine, nutritional and metabolic disease: Secondary | ICD-10-CM

## 2022-05-22 MED ORDER — SERTRALINE HCL 50 MG PO TABS
ORAL_TABLET | ORAL | 0 refills | Status: DC
  Filled 2022-05-22: qty 90, 90d supply, fill #0

## 2022-05-23 LAB — VITAMIN B12: Vitamin B-12: 527 pg/mL (ref 232–1245)

## 2022-05-25 ENCOUNTER — Other Ambulatory Visit: Payer: 59

## 2022-05-27 ENCOUNTER — Other Ambulatory Visit (HOSPITAL_COMMUNITY): Payer: Self-pay

## 2022-05-27 DIAGNOSIS — F902 Attention-deficit hyperactivity disorder, combined type: Secondary | ICD-10-CM | POA: Diagnosis not present

## 2022-05-27 DIAGNOSIS — F419 Anxiety disorder, unspecified: Secondary | ICD-10-CM | POA: Diagnosis not present

## 2022-05-27 DIAGNOSIS — G4700 Insomnia, unspecified: Secondary | ICD-10-CM | POA: Diagnosis not present

## 2022-05-27 MED ORDER — LORAZEPAM 1 MG PO TABS
ORAL_TABLET | ORAL | 0 refills | Status: DC
  Filled 2022-06-10: qty 30, 30d supply, fill #0

## 2022-05-27 MED ORDER — SERTRALINE HCL 100 MG PO TABS
100.0000 mg | ORAL_TABLET | Freq: Every morning | ORAL | 1 refills | Status: DC
  Filled 2022-05-27: qty 30, 30d supply, fill #0

## 2022-05-27 MED ORDER — BUPROPION HCL ER (XL) 150 MG PO TB24
150.0000 mg | ORAL_TABLET | Freq: Every morning | ORAL | 1 refills | Status: DC
  Filled 2022-05-27: qty 30, 30d supply, fill #0
  Filled 2022-06-28: qty 30, 30d supply, fill #1

## 2022-05-27 MED ORDER — GUANFACINE HCL 2 MG PO TABS
2.0000 mg | ORAL_TABLET | Freq: Two times a day (BID) | ORAL | 1 refills | Status: DC
  Filled 2022-05-27 – 2022-07-16 (×2): qty 180, 90d supply, fill #0
  Filled 2022-11-10: qty 180, 90d supply, fill #1

## 2022-05-27 MED ORDER — AZSTARYS 26.1-5.2 MG PO CAPS
ORAL_CAPSULE | ORAL | 0 refills | Status: DC
  Filled 2022-05-27: qty 30, 30d supply, fill #0

## 2022-06-04 ENCOUNTER — Other Ambulatory Visit (HOSPITAL_COMMUNITY): Payer: Self-pay

## 2022-06-08 ENCOUNTER — Other Ambulatory Visit (HOSPITAL_COMMUNITY): Payer: Self-pay

## 2022-06-10 ENCOUNTER — Other Ambulatory Visit (HOSPITAL_COMMUNITY): Payer: Self-pay

## 2022-06-11 ENCOUNTER — Ambulatory Visit (INDEPENDENT_AMBULATORY_CARE_PROVIDER_SITE_OTHER): Payer: 59 | Admitting: Behavioral Health

## 2022-06-11 ENCOUNTER — Other Ambulatory Visit (HOSPITAL_COMMUNITY): Payer: Self-pay

## 2022-06-11 DIAGNOSIS — Z789 Other specified health status: Secondary | ICD-10-CM | POA: Diagnosis not present

## 2022-06-11 DIAGNOSIS — F4322 Adjustment disorder with anxiety: Secondary | ICD-10-CM | POA: Diagnosis not present

## 2022-06-11 NOTE — Progress Notes (Signed)
[facility] Counselor Initial Adult Exam  Name: Dorothy Wood Date: 06/11/2022 MRN: [id number] DOB: 02-Jun-1961 PCP: Denita Lung, MD  Time spent: 60 min In Person visit @ [facility]:  Self    Paperwork requested: No   Reason for Visit /Presenting Problem: Elevated anx/dep in adjustment to health status changes invl'g Pt & her Husb who has struggled w/cancer.  Mental Status Exam: Appearance:   Neat     Behavior:  Appropriate and Sharing  Motor:  Tremor bilaterally in both hands; otherwise Pt is fine  Speech/Language:   Rushed  Affect:  Appropriate  Mood:  anxious  Thought process:  normal  Thought content:    WNL  Sensory/Perceptual disturbances:    WNL  Orientation:  oriented to person, place, and time/date  Attention:  Good  Concentration:  Good  Memory:  WNL  Fund of knowledge:   Good  Insight:    Good  Judgment:   Good  Impulse Control:  Good    Risk Assessment: Danger to Self:  No Self-injurious Behavior: No Danger to Others: No Duty to Warn:no Physical Aggression / Violence:No  Access to Firearms a concern: No  Gang Involvement:No  Patient / guardian was educated about steps to take if suicide or homicide risk level increases between visits: yes; as appropriate to Pt circumstances  While future psychiatric events cannot be accurately predicted, the patient does not currently require acute inpatient psychiatric care and does not currently meet [facility] involuntary commitment criteria.  Substance Abuse History: Current substance abuse: No     Past Psychiatric History:   No previous psychological problems have been observed Outpatient Providers: Dr. Jill Alexanders, MD & Jobie Quaker, PA-C History of Psych Hospitalization: No  Psychological Testing:  NA    Abuse History:  Victim of: No.,  NA    Report needed: No. Victim of Neglect:No. Perpetrator of  NA   Witness / Exposure to Domestic Violence: No   Protective  Services Involvement: No  Witness to [employer] Violence:  No   Family History:  Family History  Problem Relation Age of Onset   Mental illness Mother    Hypertension Mother    Heart failure Mother    CAD Father        CABG at age 72   Mental illness Sister    Alcoholism Sister    Mental illness Brother    Alcoholism Brother    Mental illness Maternal Uncle    Mental illness Maternal Grandmother    Cancer Paternal Grandmother     Living situation: the patient lives with their spouse  Sexual Orientation: Straight  Relationship Status: married  Name of spouse: Dorothy Wood If a parent, number of children / ages: 108yo Dtr Dorothy Wood who lives locally working for [employer] Dtr Dorothy Wood who has lived in [place] since graduation from [place]: spouse friends Dtrs who care about her   Financial Stress:  No   Income/Employment/Disability: 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    Recreation/Hobbies: Pt is caregiver for Husb & also cares for her Mother locally who lives alone. [place]  Dtr is independent but, "needy" & just moved out of the Family home. Pt reports she currently has little time for herself to just be alone. Husb is Ret'd from [employer] on [address] & tends to his & her healthcare needs constantly. Pt recently exp'd a 5 wk bout of vomiting that hosp'd her 3 times.   Stressors: Health problems   Loss of good health status for her Husb & her recent illness; Pt lost 20#. Pt is concerned for Retirement in 2024 & would like work to go to PT status of 20 hr/wk.    Strengths: Supportive Relationships, Family, Friends, Chief Financial Officer, and Able to Communicate Effectively  Barriers:  None noted   Legal History: Pending legal issue / charges: The patient has no significant history of legal issues. History of legal issue / charges:  NA  Medical History/Surgical History: reviewed Past Medical History:  Diagnosis Date   Arthritis    Cancer ([place])    OVARIAN   Colonic polyp    Depression    Gastritis    Hyperlipidemia    Hypertension    Hypothyroidism    Sjogren's disease ([place])    Stroke ([place])    Thyroid disease    HYPOTHYROIDISM   TIA (transient ischemic attack) 04/06/2021    Past Surgical History:  Procedure Laterality Date   ABDOMINAL HYSTERECTOMY     Ovarian cancer   BIOPSY  02/04/2021   Procedure: BIOPSY;  Surgeon: Ronald Lobo, MD;  Location: [facility];  Service: Endoscopy;;   BREAST CYST ASPIRATION     CESAREAN SECTION     x2   CHOLECYSTECTOMY N/A 01/23/2022   Procedure: LAPAROSCOPIC CHOLECYSTECTOMY WITH INTRAOPERATIVE CHOLANGIOGRAM;  Surgeon: Armandina Gemma, MD;  Location: [facility];  Service: General;  Laterality: N/A;   COLONOSCOPY  10-06   Dr. Earnest Bailey   ESOPHAGOGASTRODUODENOSCOPY N/A 02/04/2021   Procedure: ESOPHAGOGASTRODUODENOSCOPY (EGD);  Surgeon: Ronald Lobo, MD;  Location: [facility];  Service: Endoscopy;  Laterality: N/A;   TONSILLECTOMY      Medications: Current Outpatient Medications  Medication Sig Dispense Refill   aspirin EC 81 MG tablet Take 81 mg by mouth every morning. Swallow whole.     atorvastatin (LIPITOR) 80 MG tablet Take 1 tablet (80 mg total) by mouth daily. 90 tablet 0   buPROPion (WELLBUTRIN XL) 150 MG 24 hr tablet Take 1 tablet (150 mg total) by mouth in the morning. 30 tablet 1   buPROPion (WELLBUTRIN XL) 300 MG 24 hr tablet Take 1 tablet (300 mg total) by mouth every morning. 90 tablet 1   ezetimibe (ZETIA) 10 MG tablet Take 1 tablet by mouth daily. 90 tablet 3   guanFACINE (TENEX) 2 MG tablet Take 1 tablet by mouth every morning and 1 tablet at  bedtime 180 tablet 1   guanFACINE (TENEX) 2 MG tablet Take 1 tablet (2 mg total) by mouth in the morning and at bedtime. 180 tablet 1   levothyroxine (EUTHYROX) 88 MCG tablet TAKE 1 TABLET BY MOUTH ONCE A DAY 90 tablet 3   LORazepam (ATIVAN) 1 MG tablet Take 1 tablet by mouth daily as needed for anxiety (30 day supply) (Patient taking differently: Take 1 mg by mouth at bedtime as needed for sleep or anxiety.) 30 tablet 1   LORazepam (ATIVAN) 1 MG tablet Take 1 tablet by mouth daily as needed for anxiety 30 tablet 0   LORazepam (ATIVAN) 1 MG tablet Take 1 tablet by mouth daily as needed for anxiety (30 day supply) 30  tablet 0   methylphenidate (RITALIN) 20 MG tablet Take 1 tablet by mouth 3 times daily (Patient taking differently: Take 20 mg by mouth See admin instructions. Take 20 mg by mouth two to three times a day) 270 tablet 0   pilocarpine (SALAGEN) 5 MG tablet TAKE 1 TABLET BY MOUTH 4 TIMES DAILY 360 tablet 1   polyethylene glycol (MIRALAX / GLYCOLAX) 17 g packet Take 8.5 g by mouth daily as needed (constipation.).     Serdexmethylphen-Dexmethylphen (AZSTARYS) 26.1-5.2 MG CAPS Take 1 tablet by mouth every morning 30 capsule 0   sertraline (ZOLOFT) 100 MG tablet Take 1 tablet (100 mg total) by mouth in the morning. 30 tablet 1   sertraline (ZOLOFT) 50 MG tablet Take 1/2 tablet by mouth every morning for 1 week then take 1 tablet every morning (Patient taking differently: Take 50 mg by mouth daily.) 30 tablet 1   sertraline (ZOLOFT) 50 MG tablet Take 1 tablet by mouth every morning. 90 tablet 0   No current facility-administered medications for this visit.    Allergies  Allergen Reactions   Reglan [Metoclopramide] Shortness Of Breath    Diagnoses:  Adjustment disorder with anxious mood  Problems influencing health status  Plan of Care: Kaysi reports her Dtr Jordan Hawks is concerned that Fr Dorothy Wood is not helping Mom in the home, & his temperament is sometimes mean. Provide psychoedu re:  cancer & Family adjustment to illness.  Target Date: 08/11/2022 Progress: 0 Frequency: Twice monthly Modality: Arty Baumgartner sts she needs time to herself for her mental well being. Promote ways Pt can create time for herself to relax & be free from caregiving duties.  Target Date: 08/11/2022  Progress: 0  Frequency: Twice monthly  Modality: Ralene Gasparyan is worried for her hand tremors & wants answers from the Neurologist. Pt will report in session about results of visit next [facility] & discuss her concerns for improved adjustment.  Target Date: 07/02/2022  Progress: 0  Frequency: Twice monthly  Modality: [name], LMFT

## 2022-06-11 NOTE — Progress Notes (Signed)
                Jamarkus Lisbon L Cora Stetson, LMFT 

## 2022-06-16 ENCOUNTER — Encounter: Payer: Self-pay | Admitting: Neurology

## 2022-06-18 ENCOUNTER — Telehealth: Payer: Self-pay | Admitting: Neurology

## 2022-06-18 ENCOUNTER — Other Ambulatory Visit (HOSPITAL_COMMUNITY): Payer: Self-pay

## 2022-06-18 ENCOUNTER — Ambulatory Visit (INDEPENDENT_AMBULATORY_CARE_PROVIDER_SITE_OTHER): Payer: 59 | Admitting: Neurology

## 2022-06-18 ENCOUNTER — Encounter: Payer: Self-pay | Admitting: Neurology

## 2022-06-18 VITALS — BP 131/74 | HR 89 | Ht 65.0 in | Wt 125.0 lb

## 2022-06-18 DIAGNOSIS — R251 Tremor, unspecified: Secondary | ICD-10-CM

## 2022-06-18 DIAGNOSIS — Z8673 Personal history of transient ischemic attack (TIA), and cerebral infarction without residual deficits: Secondary | ICD-10-CM

## 2022-06-18 DIAGNOSIS — F418 Other specified anxiety disorders: Secondary | ICD-10-CM | POA: Diagnosis not present

## 2022-06-18 MED ORDER — PROPRANOLOL HCL ER 60 MG PO CP24
60.0000 mg | ORAL_CAPSULE | Freq: Every day | ORAL | 3 refills | Status: DC
  Filled 2022-06-18: qty 30, 30d supply, fill #0
  Filled 2022-07-16: qty 30, 30d supply, fill #1
  Filled 2022-08-27: qty 30, 30d supply, fill #2
  Filled 2022-09-29: qty 30, 30d supply, fill #3

## 2022-06-18 NOTE — Telephone Encounter (Signed)
Pt scheduled for MRI brain w/wo contrast at [place] on 06/24/2022 at 8:15am  [facility] NPR

## 2022-06-18 NOTE — Progress Notes (Signed)
[name] [address] [place]. [place] [address] [phone]       OFFICE CONSULT NOTE  [name] Date of Birth:  1960/09/29 Medical Record Number:  [id number]   Referring MD:  Jill Alexanders  Reason for Referral:  Tremors  HPI: Dorothy Wood is a pleasant 61 year old Caucasian lady seen today for office consultation visit for tremors.  She is accompanied by her daughter.  History is obtained from them and review of electronic medical records and I have personally reviewed pertinent available imaging films in PACS.  She has past medical history significant for hypertension, hyperlipidemia, depression, thyroid disease, TIA.  Patient states for the last 2 months she has noticed tremors involving both upper extremities.Symptoms mostly noticeable when she is using her hands.  Both hands are affected perhaps left more than right.  Patient states tremor started after she was started on Zoloft for depression.  She is going through a lot of stress as her husband was diagnosed with pancreatic cancer and she is taking care of him at home.  He does not drink alcohol.  He denies any drooling of saliva, stooped posture, difficulty with walking or balance.  He has history of TIA on 11/25/2020 he developed sudden onset of right arm and leg and facial heaviness and.  MRI scan of the brain and MRI of the brain unremarkable.  Echo with normal ejection fraction.  LDL cholesterol 87 mg percent.  Hemoglobin of 5.7.  Started on Lipitor and Zetia.  He states he has no further TIA or stroke.  He recently underwent lab work done by primary care physician and thyroid function test from 9 6/23 were normal/8/23 was normal.  LDL cholesterol on 04/29/2022 was 85 mg percent.  He denies any family history of tremors and has not noticed any effect of alcohol on her tremors.  ROS:   14 system review of systems is positive for hand tremors, anxiety, stress, depression hand all other systems negative  PMH:   Past Medical History:  Diagnosis Date   Arthritis    Cancer ([place])    OVARIAN   Colonic polyp    Depression    Gastritis    Hyperlipidemia    Hypertension    Hypothyroidism    Sjogren's disease ([place])    Stroke ([place])    Thyroid disease    HYPOTHYROIDISM   TIA (transient ischemic attack) 04/06/2021    Social History:  Social History   Socioeconomic History   Marital status: Married    Spouse name: Not on file   Number of children: 2   Years of education: Not on file   Highest education level: Not on file  Occupational History   Not on file  Tobacco Use   Smoking status: Never   Smokeless tobacco: Never  Vaping Use   Vaping Use: Never used  Substance and Sexual Activity   Alcohol use: Yes    Alcohol/week: 4.0 standard drinks of alcohol    Types: 4 Glasses of wine per week    Comment: 05/21/21 1 glasses wine 3 x week   Drug use: No   Sexual activity: Not Currently  Other Topics Concern   Not on file  Social History Narrative   Lives with spouse   Social Determinants of Health   Financial Resource Strain: Not on file  Food Insecurity: No Food Insecurity (01/27/2022)   Hunger Vital Sign    Worried About Running Out of Food in the Last Year: Never true  Ran Out of Food in the Last Year: Never true  Transportation Needs: No Transportation Needs (01/27/2022)   PRAPARE - Hydrologist (Medical): No    Lack of Transportation (Non-Medical): No  Physical Activity: Not on file  Stress: Not on file  Social Connections: Not on file  Intimate Partner Violence: Not on file    Medications:   Current Outpatient Medications on File Prior to Visit  Medication Sig Dispense Refill   aspirin EC 81 MG tablet Take 81 mg by mouth every morning. Swallow whole.     atorvastatin (LIPITOR) 80 MG tablet Take 1 tablet (80 mg total) by mouth daily. 90 tablet 0   buPROPion (WELLBUTRIN XL) 150 MG 24 hr tablet Take 1 tablet (150 mg total) by mouth in the  morning. 30 tablet 1   ezetimibe (ZETIA) 10 MG tablet Take 1 tablet by mouth daily. 90 tablet 3   guanFACINE (TENEX) 2 MG tablet Take 1 tablet (2 mg total) by mouth in the morning and at bedtime. 180 tablet 1   levothyroxine (EUTHYROX) 88 MCG tablet TAKE 1 TABLET BY MOUTH ONCE A DAY 90 tablet 3   LORazepam (ATIVAN) 1 MG tablet Take 1 tablet by mouth daily as needed for anxiety (30 day supply) 30 tablet 0   pilocarpine (SALAGEN) 5 MG tablet TAKE 1 TABLET BY MOUTH 4 TIMES DAILY 360 tablet 1   polyethylene glycol (MIRALAX / GLYCOLAX) 17 g packet Take 8.5 g by mouth daily as needed (constipation.).     Serdexmethylphen-Dexmethylphen (AZSTARYS) 26.1-5.2 MG CAPS Take 1 tablet by mouth every morning 30 capsule 0   sertraline (ZOLOFT) 100 MG tablet Take 1 tablet (100 mg total) by mouth in the morning. 30 tablet 1   [DISCONTINUED] estradiol (ESTRACE) 0.5 MG tablet TAKE 1 TABLET BY MOUTH ONCE A DAY (Patient not taking: No sig reported) 30 tablet 2   No current facility-administered medications on file prior to visit.    Allergies:   Allergies  Allergen Reactions   Reglan [Metoclopramide] Shortness Of Breath    Physical Exam General: well developed, well nourished pleasant [name], seated, in no evident distress Head: head normocephalic and atraumatic.   Neck: supple with no carotid or supraclavicular bruits Cardiovascular: regular rate and rhythm, no murmurs Musculoskeletal: no deformity Skin:  no rash/petichiae Vascular:  Normal pulses all extremities  Neurologic Exam Mental Status: Awake and fully alert. Oriented to place and time. Recent and remote memory intact. Attention span, concentration and fund of knowledge appropriate. Mood and affect appropriate.  Glabellar tap is absent.  Good facial expression Cranial Nerves: Fundoscopic exam reveals sharp disc margins. Pupils equal, briskly reactive to light. Extraocular movements full without nystagmus. Visual fields full to  confrontation. Hearing intact. Facial sensation intact. Face, tongue, palate moves normally and symmetrically.  Motor: Normal bulk and tone. Normal strength in all tested extremity muscles.  No cogwheel rigidity.  No resting tremor.  Fine action tremor of both outstretched upper extremities which improves with distraction.  No head neck or voice tremor. Sensory.: intact to touch , pinprick , position and vibratory sensation.  Coordination: Rapid alternating movements normal in all extremities. Finger-to-nose and heel-to-shin performed accurately bilaterally. Gait and Station: Arises from chair without difficulty. Stance is normal. Gait demonstrates normal stride length and balance . Able to heel, toe and tandem walk without difficulty.  No stooped posture or gait festination.  Good postural balance to threat Reflexes: 1+ and symmetric. Toes downgoing.  ASSESSMENT: 61 year old Caucasian lady with history of bilateral upper extremity fine tremors of unclear etiology possibly situational anxiety.versus  Medication effect.  Episode of transient right-sided numbness and heaviness March 2022.  TIA versus anxiety     PLAN:I had a long discussion with the patient and her daughter regarding her new complaints of upper extremity tremors which may be related to situational anxiety versus medication effect of Zoloft.  I recommend she try Inderal LA 60 mg daily and discussed with her psychiatrist changing Zoloft to alternative medication.  I also encouraged her to increase participation in regular activities for stress relaxation meditation, yoga exercises.  Check MRI scan of the brain to rule out any structural lesion.  She will return to follow-up in the future only as needed.  Greater than 50% time during this 45-minute consultation visit was spent in counseling and coordination of care about her care medicine patient anxiety discussion about differential diagnosis  Antony Contras, MD Note: This document  was prepared with digital dictation and possible smart phrase technology. Any transcriptional errors that result from this process are unintentional.

## 2022-06-18 NOTE — Patient Instructions (Signed)
I had a long discussion with the patient and her daughter regarding her new complaints of upper extremity tremors which may be related to situational anxiety versus medication effect of Zoloft.  I recommend she try Inderal LA 60 mg daily and discussed with her psychiatrist changing Zoloft to alternative medication.  I also encouraged her to increase participation in regular activities for stress relaxation meditation, yoga exercises.  Check MRI scan of the brain to rule out any structural lesion.  She will return to follow-up in the future only as needed.

## 2022-06-19 ENCOUNTER — Encounter: Payer: Self-pay | Admitting: Neurology

## 2022-06-23 ENCOUNTER — Encounter: Payer: Self-pay | Admitting: Internal Medicine

## 2022-06-23 ENCOUNTER — Encounter: Payer: Self-pay | Admitting: Family Medicine

## 2022-06-23 ENCOUNTER — Telehealth: Payer: Self-pay

## 2022-06-23 NOTE — Telephone Encounter (Signed)
LVMT pt requesting call back about PREP class starting on 06/30/22

## 2022-06-24 ENCOUNTER — Ambulatory Visit (INDEPENDENT_AMBULATORY_CARE_PROVIDER_SITE_OTHER): Payer: 59

## 2022-06-24 DIAGNOSIS — R251 Tremor, unspecified: Secondary | ICD-10-CM

## 2022-06-24 MED ORDER — GADOBENATE DIMEGLUMINE 529 MG/ML IV SOLN
10.0000 mL | Freq: Once | INTRAVENOUS | Status: AC | PRN
  Administered 2022-06-24: 10 mL via INTRAVENOUS

## 2022-06-26 NOTE — Progress Notes (Signed)
Kindly inform the patient that MRI scan of the brain study was normal

## 2022-06-29 ENCOUNTER — Other Ambulatory Visit (HOSPITAL_COMMUNITY): Payer: Self-pay

## 2022-06-30 ENCOUNTER — Telehealth: Payer: Self-pay

## 2022-06-30 NOTE — Telephone Encounter (Signed)
-----   Message from Garvin Fila, MD sent at 06/26/2022  8:08 PM EDT ----- Dorothy Wood inform the patient that MRI scan of the brain study was normal

## 2022-06-30 NOTE — Telephone Encounter (Signed)
I spoke with the patient and informed her of the results. She stated her blood pressure has been well controlled with propranolol HCl 60 mg. Blood pressure ranges in the 110/50s. She verbalized understanding of the findings and expressed appreciation for the call.

## 2022-06-30 NOTE — Progress Notes (Signed)
[facility] PREP Evaluation  Patient Details  Name: JULIAHNA WISWELL MRN: [id number] Date of Birth: [birth year] Age: 61 y.o. PCP: Denita Lung, MD  Vitals:   06/29/22 1830  BP: 117/60  Pulse: (!) 54  SpO2: 99%  Weight: 129 lb 3.2 oz (58.6 kg)     [facility] Eval - 06/30/22 1600       [facility] "PREP" Location   [facility] "PREP" Location [facility]      Referral    Referring Provider Walker    Reason for referral Hypertension;Inactivity   high stress   Program Start Date 06/30/22   T/TH 6p-715p x 12wks     Measurement   Waist Circumference 32.5 inches    Hip Circumference 35 inches    Body fat 27.5 percent      Information for Trainer   Goals Get stronger, get healthier    Current Exercise none    Orthopedic Concerns none    Pertinent Medical History HTN history, OSA, GERD, hypothroid    Current Barriers none    Restrictions/Precautions --   none   Medications that affect exercise Medication causing dizziness/drowsiness      Timed Up and Go (TUGS)   Timed Up and Go Low risk <9 seconds      Mobility and Daily Activities   I find it easy to walk up or down two or more flights of stairs. 4    I have no trouble taking out the trash. 4    I do housework such as vacuuming and dusting on my own without difficulty. 4    I can easily lift a gallon of milk (8lbs). 4    I can easily walk a mile. 4    I have no trouble reaching into high cupboards or reaching down to pick up something from the floor. 4    I do not have trouble doing out-door work such as Armed forces logistics/support/administrative officer, raking leaves, or gardening. 4      Mobility and Daily Activities   I feel younger than my age. 1    I feel independent. 4    I feel energetic. 2    I live an active life.  2    I feel strong. 1    I feel healthy. 2    I feel active as other people my age. 3      How fit and strong are you.   Fit and Strong Total Score 43            Past Medical History:  Diagnosis Date   Arthritis    Cancer ([place])     OVARIAN   Colonic polyp    Depression    Gastritis    Hyperlipidemia    Hypertension    Hypothyroidism    Sjogren's disease ([place])    Stroke ([place])    Thyroid disease    HYPOTHYROIDISM   TIA (transient ischemic attack) 04/06/2021   Past Surgical History:  Procedure Laterality Date   ABDOMINAL HYSTERECTOMY     Ovarian cancer   BIOPSY  02/04/2021   Procedure: BIOPSY;  Surgeon: Ronald Lobo, MD;  [facility];  Service: Endoscopy;;   BREAST CYST ASPIRATION     CESAREAN SECTION     x2   CHOLECYSTECTOMY N/A 01/23/2022   Procedure: LAPAROSCOPIC CHOLECYSTECTOMY WITH INTRAOPERATIVE CHOLANGIOGRAM;  Surgeon: Armandina Gemma, MD;  [facility];  Service: General;  Laterality: N/A;   COLONOSCOPY  10-06   Dr. Earnest Bailey  ESOPHAGOGASTRODUODENOSCOPY N/A 02/04/2021   Procedure: ESOPHAGOGASTRODUODENOSCOPY (EGD);  Surgeon: Ronald Lobo, MD;  Location: [facility];  Service: Endoscopy;  Laterality: N/A;   TONSILLECTOMY     Social History   Tobacco Use  Smoking Status Never  Smokeless Tobacco Never    Barnett Hatter 06/30/2022, 4:33 PM

## 2022-07-02 ENCOUNTER — Ambulatory Visit (INDEPENDENT_AMBULATORY_CARE_PROVIDER_SITE_OTHER): Payer: 59 | Admitting: Behavioral Health

## 2022-07-02 DIAGNOSIS — Z789 Other specified health status: Secondary | ICD-10-CM | POA: Diagnosis not present

## 2022-07-02 DIAGNOSIS — F4322 Adjustment disorder with anxiety: Secondary | ICD-10-CM | POA: Diagnosis not present

## 2022-07-02 NOTE — Progress Notes (Signed)
[facility] Counselor/Therapist Progress Note  Patient ID: Dorothy Wood, MRN: [id number],    Date: 07/02/2022  Time Spent: 105 min Caregility video; Pt is home in private & Provider working remote @ [facility] [employer]   Treatment Type: Individual Therapy  Reported Symptoms: Elevated anx/dep due to [facility] Caregiving duties & taking care of her Dtr who has needed Mom excessively.  Mental Status Exam: Appearance:  Casual     Behavior: Appropriate and Sharing  Motor: Normal  Speech/Language:  Clear and Coherent  Affect: Appropriate  Mood: normal  Thought process: normal  Thought content:   WNL  Sensory/Perceptual disturbances:   WNL  Orientation: oriented to person, place, and time/date  Attention: Good  Concentration: Good  Memory: WNL  Fund of knowledge:  Good  Insight:   Good  Judgment:  Good  Impulse Control: Good   Risk Assessment: Danger to Self:  No Self-injurious Behavior: No Danger to Others: No Duty to Warn:no Physical Aggression / Violence:No  Access to Firearms a concern: No  Gang Involvement:No   Subjective: Dorothy Wood is managing the medical & mental health care needs of multiple Family members. She is stressed & has addressed the need to change her medications w/her PCP & this is helping her. Today she needs to decompress & process her concerns for Husb [name] who is undergoing Tx protocol for cancer & taking more care of himself. She is also encouraging her younger Dtr to be more independent in all ways.   Interventions: Psycho-education/Bibliotherapy and Family Systems  Diagnosis:Adjustment disorder with anxious mood  Problems influencing health status  Plan: Dorothy Wood has consulted her PCP & changed her medications. This has assisted her to move away from a depressed state. She is cont'g to monitor her s/e & has exp'd minimal so far. She feels positive about the changes.  Pt will take more active steps to put self-care practices in place using  suggestions offered today.  Target Date: 08/02/2022  Progress: 3  Frequency: Twice monthly  Modality: [name], LMFT

## 2022-07-02 NOTE — Progress Notes (Signed)
                Elianie Hubers L Allien Melberg, LMFT 

## 2022-07-06 ENCOUNTER — Encounter: Payer: Self-pay | Admitting: Internal Medicine

## 2022-07-07 ENCOUNTER — Other Ambulatory Visit (HOSPITAL_COMMUNITY): Payer: Self-pay

## 2022-07-08 ENCOUNTER — Other Ambulatory Visit (HOSPITAL_COMMUNITY): Payer: Self-pay

## 2022-07-08 MED ORDER — LORAZEPAM 1 MG PO TABS
1.0000 mg | ORAL_TABLET | Freq: Every day | ORAL | 0 refills | Status: DC
  Filled 2022-07-08: qty 30, 30d supply, fill #0

## 2022-07-10 ENCOUNTER — Other Ambulatory Visit (HOSPITAL_COMMUNITY): Payer: Self-pay

## 2022-07-15 ENCOUNTER — Other Ambulatory Visit (HOSPITAL_COMMUNITY): Payer: Self-pay

## 2022-07-15 MED ORDER — AZSTARYS 26.1-5.2 MG PO CAPS
1.0000 | ORAL_CAPSULE | Freq: Every morning | ORAL | 0 refills | Status: DC
  Filled 2022-07-15: qty 30, 30d supply, fill #0

## 2022-07-16 ENCOUNTER — Other Ambulatory Visit (HOSPITAL_COMMUNITY): Payer: Self-pay

## 2022-07-16 ENCOUNTER — Ambulatory Visit: Payer: 59 | Admitting: Neurology

## 2022-07-20 ENCOUNTER — Other Ambulatory Visit (HOSPITAL_COMMUNITY): Payer: Self-pay

## 2022-07-22 ENCOUNTER — Emergency Department (HOSPITAL_COMMUNITY)
Admission: EM | Admit: 2022-07-22 | Discharge: 2022-07-23 | Disposition: A | Discharge status: Home / Self Care | Payer: 59 | Attending: Emergency Medicine | Admitting: Emergency Medicine

## 2022-07-22 ENCOUNTER — Emergency Department (HOSPITAL_COMMUNITY): Payer: 59

## 2022-07-22 ENCOUNTER — Encounter (HOSPITAL_COMMUNITY): Payer: Self-pay | Admitting: *Deleted

## 2022-07-22 DIAGNOSIS — K219 Gastro-esophageal reflux disease without esophagitis: Secondary | ICD-10-CM | POA: Insufficient documentation

## 2022-07-22 DIAGNOSIS — F419 Anxiety disorder, unspecified: Secondary | ICD-10-CM | POA: Diagnosis not present

## 2022-07-22 DIAGNOSIS — I10 Essential (primary) hypertension: Secondary | ICD-10-CM | POA: Diagnosis not present

## 2022-07-22 DIAGNOSIS — R109 Unspecified abdominal pain: Secondary | ICD-10-CM | POA: Diagnosis not present

## 2022-07-22 DIAGNOSIS — R110 Nausea: Secondary | ICD-10-CM

## 2022-07-22 DIAGNOSIS — Z7982 Long term (current) use of aspirin: Secondary | ICD-10-CM | POA: Insufficient documentation

## 2022-07-22 DIAGNOSIS — R1110 Vomiting, unspecified: Secondary | ICD-10-CM | POA: Diagnosis not present

## 2022-07-22 DIAGNOSIS — R112 Nausea with vomiting, unspecified: Secondary | ICD-10-CM | POA: Diagnosis present

## 2022-07-22 DIAGNOSIS — K760 Fatty (change of) liver, not elsewhere classified: Secondary | ICD-10-CM | POA: Diagnosis not present

## 2022-07-22 DIAGNOSIS — R000 Tachycardia, unspecified: Secondary | ICD-10-CM | POA: Insufficient documentation

## 2022-07-22 LAB — CBC WITH DIFFERENTIAL/PLATELET
Abs Immature Granulocytes: 0.07 10*3/uL (ref 0.00–0.07)
Basophils Absolute: 0.1 10*3/uL (ref 0.0–0.1)
Basophils Relative: 0 %
Eosinophils Absolute: 0.1 10*3/uL (ref 0.0–0.5)
Eosinophils Relative: 1 %
HCT: 41.6 % (ref 36.0–46.0)
Hemoglobin: 14.4 g/dL (ref 12.0–15.0)
Immature Granulocytes: 1 %
Lymphocytes Relative: 29 %
Lymphs Abs: 4.3 10*3/uL — ABNORMAL HIGH (ref 0.7–4.0)
MCH: 29.9 pg (ref 26.0–34.0)
MCHC: 34.6 g/dL (ref 30.0–36.0)
MCV: 86.3 fL (ref 80.0–100.0)
Monocytes Absolute: 1.4 10*3/uL — ABNORMAL HIGH (ref 0.1–1.0)
Monocytes Relative: 9 %
Neutro Abs: 9.1 10*3/uL — ABNORMAL HIGH (ref 1.7–7.7)
Neutrophils Relative %: 60 %
Platelets: 300 10*3/uL (ref 150–400)
RBC: 4.82 MIL/uL (ref 3.87–5.11)
RDW: 12.1 % (ref 11.5–15.5)
WBC: 14.9 10*3/uL — ABNORMAL HIGH (ref 4.0–10.5)
nRBC: 0 % (ref 0.0–0.2)

## 2022-07-22 LAB — COMPREHENSIVE METABOLIC PANEL
ALT: 50 U/L — ABNORMAL HIGH (ref 0–44)
AST: 45 U/L — ABNORMAL HIGH (ref 15–41)
Albumin: 4.9 g/dL (ref 3.5–5.0)
Alkaline Phosphatase: 94 U/L (ref 38–126)
Anion gap: 14 (ref 5–15)
BUN: 15 mg/dL (ref 8–23)
CO2: 19 mmol/L — ABNORMAL LOW (ref 22–32)
Calcium: 10.1 mg/dL (ref 8.9–10.3)
Chloride: 100 mmol/L (ref 98–111)
Creatinine, Ser: 0.96 mg/dL (ref 0.44–1.00)
GFR, Estimated: >60 mL/min (ref >60)
Glucose, Bld: 156 mg/dL — ABNORMAL HIGH (ref 70–99)
Potassium: 3.9 mmol/L (ref 3.5–5.1)
Sodium: 133 mmol/L — ABNORMAL LOW (ref 135–145)
Total Bilirubin: 0.7 mg/dL (ref 0.3–1.2)
Total Protein: 8 g/dL (ref 6.5–8.1)

## 2022-07-22 LAB — URINALYSIS, ROUTINE W REFLEX MICROSCOPIC
Bacteria, UA: NONE SEEN
Bilirubin Urine: NEGATIVE
Glucose, UA: NEGATIVE mg/dL
Hgb urine dipstick: NEGATIVE
Ketones, ur: 20 mg/dL — AB
Nitrite: NEGATIVE
Protein, ur: 30 mg/dL — AB
Specific Gravity, Urine: 1.045 — ABNORMAL HIGH (ref 1.005–1.030)
pH: 9 — ABNORMAL HIGH (ref 5.0–8.0)

## 2022-07-22 LAB — LIPASE, BLOOD: Lipase: 32 U/L (ref 11–51)

## 2022-07-22 LAB — TROPONIN I (HIGH SENSITIVITY): Troponin I (High Sensitivity): 3 ng/L (ref <18)

## 2022-07-22 LAB — MAGNESIUM: Magnesium: 1.7 mg/dL (ref 1.7–2.4)

## 2022-07-22 MED ORDER — LORAZEPAM 2 MG/ML IJ SOLN
1.0000 mg | Freq: Once | INTRAMUSCULAR | Status: AC
  Administered 2022-07-22: 1 mg via INTRAVENOUS
  Filled 2022-07-22: qty 1

## 2022-07-22 MED ORDER — SODIUM CHLORIDE 0.9 % IV BOLUS
1000.0000 mL | Freq: Once | INTRAVENOUS | Status: AC
  Administered 2022-07-22: 1000 mL via INTRAVENOUS

## 2022-07-22 MED ORDER — FAMOTIDINE 20 MG PO TABS
20.0000 mg | ORAL_TABLET | Freq: Two times a day (BID) | ORAL | 0 refills | Status: DC
  Filled 2022-07-22: qty 30, 15d supply, fill #0

## 2022-07-22 MED ORDER — IOHEXOL 300 MG/ML  SOLN
100.0000 mL | Freq: Once | INTRAMUSCULAR | Status: AC | PRN
  Administered 2022-07-22: 100 mL via INTRAVENOUS

## 2022-07-22 MED ORDER — TRIMETHOBENZAMIDE HCL 100 MG/ML IM SOLN
200.0000 mg | Freq: Once | INTRAMUSCULAR | Status: AC
  Administered 2022-07-22: 200 mg via INTRAMUSCULAR
  Filled 2022-07-22: qty 2

## 2022-07-22 MED ORDER — ALUM & MAG HYDROXIDE-SIMETH 200-200-20 MG/5ML PO SUSP
15.0000 mL | Freq: Once | ORAL | Status: AC
  Administered 2022-07-22: 15 mL via ORAL
  Filled 2022-07-22: qty 30

## 2022-07-22 MED ORDER — LORAZEPAM 0.5 MG PO TABS
0.5000 mg | ORAL_TABLET | Freq: Once | ORAL | Status: AC
  Administered 2022-07-22: 0.5 mg via ORAL
  Filled 2022-07-22: qty 1

## 2022-07-22 MED ORDER — OMEPRAZOLE 20 MG PO CPDR
20.0000 mg | DELAYED_RELEASE_CAPSULE | Freq: Two times a day (BID) | ORAL | 0 refills | Status: DC
  Filled 2022-07-22: qty 28, 14d supply, fill #0

## 2022-07-22 MED ORDER — FAMOTIDINE IN NACL 20-0.9 MG/50ML-% IV SOLN
20.0000 mg | Freq: Once | INTRAVENOUS | Status: AC
  Administered 2022-07-22: 20 mg via INTRAVENOUS
  Filled 2022-07-22: qty 50

## 2022-07-22 NOTE — ED Triage Notes (Signed)
Pt reporting sudden onset of nasuea, vomiting, dizziness, some upper abdominal. Took zofran 8 mg about 1945.

## 2022-07-22 NOTE — ED Provider Triage Note (Signed)
Emergency Medicine Provider Triage Evaluation Note  Dorothy Wood , a 61 y.o. female  was evaluated in triage.  Pt complains of sudden onset nausea and vomiting. The patient reports that this has happened 4 times bofre in the past with the last time being in May and she had her glalbladder removed. She denies any abdominal pain however, .Reports that she has dealt with prolonged QT in the psat but took zofran '8mg'$  earlier.  Review of Systems  Positive:  Negative:   Physical Exam  BP (!) 107/92   Temp 97.7 F (36.5 C)   Resp (!) 26   SpO2 99%  Gen:   Awake,hollering, dry heaving in a bag Resp:  Normal effort  MSK:   Moves extremities without difficulty  Other:    Medical Decision Making  Medically screening exam initiated at 8:46 PM.  Appropriate orders placed.  Dorothy Wood was informed that the remainder of the evaluation will be completed by another provider, this initial triage assessment does not replace that evaluation, and the importance of remaining in the ED until their evaluation is complete.  Labs ordered. Patient is being roomed to [address]   Sherrell Puller, [place] 07/22/22 2048

## 2022-07-22 NOTE — ED Notes (Signed)
Unsuccessful PIV in R AC and R Hand, will have RN attempt.

## 2022-07-22 NOTE — ED Provider Notes (Signed)
[facility] DEPT Provider Note   CSN: [id number] Arrival date & time: 07/22/22  2026     History  Chief Complaint  Patient presents with   Nausea    Dorothy Wood is a 61 y.o. female.  Patient is a 61 year old female presenting for nausea. Patient is hyperventilating in triage with a respiratory rate above 30 breaths/min.  Her daughter states she began having nausea without vomiting that began at 1 PM today.  No vomiting.  No diarrhea.  No history of fevers.  States her mom has had symptoms like this in the past led to a cholecystectomy in May 2023.  She also states that her father is currently admitted to the hospital upstairs for pancreatic cancer and had multiple complications this week including a GI bleed.  She is concerned that her mom is more so having anxiety and a panic attack.  The history is provided by the patient. No language interpreter was used.       Home Medications Prior to Admission medications   Medication Sig Start Date End Date Taking? Authorizing Provider  famotidine (PEPCID) 20 MG tablet Take 1 tablet (20 mg total) by mouth 2 (two) times daily. 36/1/44  Yes Campbell Stall P, DO  omeprazole (PRILOSEC) 20 MG capsule Take 1 capsule (20 mg total) by mouth 2 (two) times daily before a meal for 14 days. 07/22/22 31/54/00 Yes Campbell Stall P, DO  aspirin EC 81 MG tablet Take 81 mg by mouth every morning. Swallow whole.    [provider]  atorvastatin (LIPITOR) 80 MG tablet Take 1 tablet (80 mg total) by mouth daily. 04/22/22 04/22/23  Denita Lung, MD  buPROPion (WELLBUTRIN XL) 150 MG 24 hr tablet Take 1 tablet (150 mg total) by mouth in the morning. 05/27/22     ezetimibe (ZETIA) 10 MG tablet Take 1 tablet by mouth daily. 05/01/22 04/26/23  Loel Dubonnet, NP  guanFACINE (TENEX) 2 MG tablet Take 1 tablet (2 mg total) by mouth in the morning and at bedtime. 05/27/22     levothyroxine (EUTHYROX) 88 MCG tablet TAKE 1 TABLET BY  MOUTH ONCE A DAY 12/23/21     LORazepam (ATIVAN) 1 MG tablet Take 1 tablet by mouth daily as needed for anxiety (30 day supply) 05/27/22     LORazepam (ATIVAN) 1 MG tablet Take 1 tablet (1 mg total) by mouth daily as needed for anxiety (30 day supply). 07/07/22     pilocarpine (SALAGEN) 5 MG tablet TAKE 1 TABLET BY MOUTH 4 TIMES DAILY 03/26/22 03/26/23  Denita Lung, MD  polyethylene glycol (MIRALAX / GLYCOLAX) 17 g packet Take 8.5 g by mouth daily as needed (constipation.).    [provider]  propranolol ER (INDERAL LA) 60 MG 24 hr capsule Take 1 capsule (60 mg total) by mouth daily. 06/18/22   Garvin Fila, MD  Serdexmethylphen-Dexmethylphen (AZSTARYS) 26.1-5.2 MG CAPS Take 1 tablet by mouth in the morning. 07/15/22     sertraline (ZOLOFT) 100 MG tablet Take 1 tablet (100 mg total) by mouth in the morning. 05/27/22     estradiol (ESTRACE) 0.5 MG tablet TAKE 1 TABLET BY MOUTH ONCE A DAY Patient not taking: No sig reported 06/03/20 04/07/21  Vania Rea, MD      Allergies    Reglan [metoclopramide]    Review of Systems   Review of Systems  Constitutional:  Negative for chills and fever.  HENT:  Negative for ear pain and sore throat.  Eyes:  Negative for pain and visual disturbance.  Respiratory:  Negative for cough and shortness of breath.   Cardiovascular:  Negative for chest pain and palpitations.  Gastrointestinal:  Positive for nausea. Negative for abdominal pain and vomiting.  Genitourinary:  Negative for dysuria and hematuria.  Musculoskeletal:  Negative for arthralgias and back pain.  Skin:  Negative for color change and rash.  Neurological:  Negative for seizures and syncope.  All other systems reviewed and are negative.   Physical Exam Updated Vital Signs BP 136/68   Pulse 83   Temp 97.7 F (36.5 C)   Resp (!) 21   SpO2 100%  Physical Exam Vitals and nursing note reviewed.  Constitutional:      General: She is not in acute distress.    Appearance: She is  well-developed.  HENT:     Head: Normocephalic and atraumatic.  Eyes:     Conjunctiva/sclera: Conjunctivae normal.  Cardiovascular:     Rate and Rhythm: Normal rate and regular rhythm.     Heart sounds: No murmur heard. Pulmonary:     Effort: Pulmonary effort is normal. Tachypnea present. No respiratory distress.     Breath sounds: Normal breath sounds.  Abdominal:     Palpations: Abdomen is soft.     Tenderness: There is no abdominal tenderness.  Musculoskeletal:        General: No swelling.     Cervical back: Neck supple.  Skin:    General: Skin is warm and dry.     Capillary Refill: Capillary refill takes less than 2 seconds.  Neurological:     Mental Status: She is alert.  Psychiatric:        Mood and Affect: Mood is anxious.     ED Results / Procedures / Treatments   Labs (all labs ordered are listed, but only abnormal results are displayed) Labs Reviewed  CBC WITH DIFFERENTIAL/PLATELET - Abnormal; Notable for the following components:      Result Value   WBC 14.9 (*)    Neutro Abs 9.1 (*)    Lymphs Abs 4.3 (*)    Monocytes Absolute 1.4 (*)    All other components within normal limits  COMPREHENSIVE METABOLIC PANEL - Abnormal; Notable for the following components:   Sodium 133 (*)    CO2 19 (*)    Glucose, Bld 156 (*)    AST 45 (*)    ALT 50 (*)    All other components within normal limits  MAGNESIUM  LIPASE, BLOOD  URINALYSIS, ROUTINE W REFLEX MICROSCOPIC  TROPONIN I (HIGH SENSITIVITY)  TROPONIN I (HIGH SENSITIVITY)    EKG None  Radiology CT ABDOMEN PELVIS W CONTRAST  Result Date: 07/22/2022 CLINICAL DATA:  Sudden onset nausea vomiting and abdominal pain EXAM: CT ABDOMEN AND PELVIS WITH CONTRAST TECHNIQUE: Multidetector CT imaging of the abdomen and pelvis was performed using the standard protocol following bolus administration of intravenous contrast. RADIATION DOSE REDUCTION: This exam was performed according to the departmental dose-optimization  program which includes automated exposure control, adjustment of the mA and/or kV according to patient size and/or use of iterative reconstruction technique. CONTRAST:  181m OMNIPAQUE IOHEXOL 300 MG/ML  SOLN COMPARISON:  CT abdomen and pelvis 01/06/2022 FINDINGS: Lower chest: No acute abnormality. Similar moderate thickening at the gastroesophageal junction. Hepatobiliary: Hepatic steatosis. No focal liver lesion. Cholecystectomy. No biliary dilation. Pancreas: Unremarkable. Spleen: Unremarkable. Adrenals/Urinary Tract: Unremarkable adrenal glands. No urinary calculi or hydronephrosis. Stomach/Bowel: Normal caliber large and small bowel. Colonic diverticulosis without  diverticulitis. Unremarkable stomach. Normal appendix. Vascular/Lymphatic: No significant vascular findings are present. No enlarged abdominal or pelvic lymph nodes. Reproductive: Status post hysterectomy. No adnexal masses. Other: No free intraperitoneal fluid or air. Musculoskeletal: No acute or significant osseous findings. IMPRESSION: No acute abnormality. Wall thickening at the gastroesophageal junction suggesting reflux/esophagitis. Hepatic steatosis. Colonic diverticulosis without diverticulitis. Electronically Signed   By: Placido Sou M.D.   On: 07/22/2022 22:36    Procedures Procedures    Medications Ordered in ED Medications  famotidine (PEPCID) IVPB 20 mg premix (20 mg Intravenous New Bag/Given 07/22/22 2312)  LORazepam (ATIVAN) tablet 0.5 mg (0.5 mg Oral Given 07/22/22 2056)  sodium chloride 0.9 % bolus 1,000 mL (1,000 mLs Intravenous New Bag/Given 07/22/22 2122)  LORazepam (ATIVAN) injection 1 mg (1 mg Intravenous Given 07/22/22 2123)  trimethobenzamide (TIGAN) injection 200 mg (200 mg Intramuscular Given 07/22/22 2124)  iohexol (OMNIPAQUE) 300 MG/ML solution 100 mL (100 mLs Intravenous Contrast Given 07/22/22 2217)  alum & mag hydroxide-simeth (MAALOX/MYLANTA) 200-200-20 MG/5ML suspension 15 mL (15 mLs Oral Given 07/22/22  2313)    ED Course/ Medical Decision Making/ A&P                           Medical Decision Making Amount and/or Complexity of Data Reviewed Radiology: ordered.  Risk OTC drugs. Prescription drug management.   Patient is a 61 year old female presenting for panic attack and nausea.  Patient states she began having nausea at the house at approximately 1 PM today without vomiting.  Patient presented to emergency department hyperventilating and tachycardic.  Patient daughter states that patient's husband is currently admitted to the hospital for pancreatic cancer and patient is having frequent anxiety attacks similar to today's.  Patient given Ativan and had improvement of hyperventilation.  Patient admits to nausea without vomiting.  Symptoms similar in the past which resulted in a cholecystectomy.  Patient's abdomen is soft and nontender at this time.  Laboratory studies demonstrate some leukocytosis which may be secondary to demargination from vomiting.  CT abdomen and pelvis with IV contrast demonstrates possible esophagitis only.  Pepcid and omeprazole given and sent to pharmacy.  Patient also received IV fluids and Tigan on arrival secondary to history of prolonged Qtc.  On reevaluation patient admits to resolution of symptoms.  Stable for discharge at this time.  Recommend close follow-up with GI specialist.  Patient in no distress and overall condition improved here in the ED. Detailed discussions were had with the patient regarding current findings, and need for close f/u with PCP or on call doctor. The patient has been instructed to return immediately if the symptoms worsen in any way for re-evaluation. Patient verbalized understanding and is in agreement with current care plan. All questions answered prior to discharge.         Final Clinical Impression(s) / ED Diagnoses Final diagnoses:  Nausea  Gastroesophageal reflux disease, unspecified whether esophagitis present    Rx /  DC Orders ED Discharge Orders          Ordered    famotidine (PEPCID) 20 MG tablet  2 times daily        07/22/22 2326    omeprazole (PRILOSEC) 20 MG capsule  2 times daily before meals        07/22/22 2326              Lianne Cure, DO 67/12/45 2328

## 2022-07-23 ENCOUNTER — Other Ambulatory Visit: Payer: Self-pay

## 2022-07-23 ENCOUNTER — Other Ambulatory Visit (HOSPITAL_COMMUNITY): Payer: Self-pay

## 2022-07-23 ENCOUNTER — Ambulatory Visit: Payer: 59 | Admitting: Behavioral Health

## 2022-07-23 ENCOUNTER — Ambulatory Visit: Payer: 59 | Admitting: Orthopaedic Surgery

## 2022-07-23 ENCOUNTER — Encounter (HOSPITAL_COMMUNITY): Payer: Self-pay | Admitting: Emergency Medicine

## 2022-07-23 DIAGNOSIS — K219 Gastro-esophageal reflux disease without esophagitis: Secondary | ICD-10-CM | POA: Diagnosis not present

## 2022-07-23 DIAGNOSIS — F419 Anxiety disorder, unspecified: Secondary | ICD-10-CM | POA: Diagnosis not present

## 2022-07-23 DIAGNOSIS — R000 Tachycardia, unspecified: Secondary | ICD-10-CM | POA: Diagnosis not present

## 2022-07-23 DIAGNOSIS — Z7982 Long term (current) use of aspirin: Secondary | ICD-10-CM | POA: Diagnosis not present

## 2022-07-23 MED ORDER — TRIMETHOBENZAMIDE HCL 100 MG/ML IM SOLN
200.0000 mg | Freq: Once | INTRAMUSCULAR | Status: AC
  Administered 2022-07-23: 200 mg via INTRAMUSCULAR
  Filled 2022-07-23: qty 2

## 2022-07-23 NOTE — Progress Notes (Unsigned)
                Roderic Lammert L Minnette Merida, LMFT 

## 2022-07-24 ENCOUNTER — Ambulatory Visit
Admission: RE | Admit: 2022-07-24 | Discharge: 2022-07-24 | Disposition: A | Discharge status: Home / Self Care | Payer: 59 | Source: Ambulatory Visit | Attending: Medical | Admitting: Medical

## 2022-07-24 ENCOUNTER — Other Ambulatory Visit: Payer: Self-pay | Admitting: Medical

## 2022-07-24 ENCOUNTER — Other Ambulatory Visit: Payer: 59

## 2022-07-24 ENCOUNTER — Telehealth: Payer: 59 | Admitting: Medical

## 2022-07-24 ENCOUNTER — Encounter: Payer: Self-pay | Admitting: Medical

## 2022-07-24 ENCOUNTER — Other Ambulatory Visit (HOSPITAL_COMMUNITY): Payer: Self-pay

## 2022-07-24 VITALS — BP 140/100 | HR 84 | Temp 99.1°F | Resp 16 | Wt 125.0 lb

## 2022-07-24 DIAGNOSIS — F419 Anxiety disorder, unspecified: Secondary | ICD-10-CM | POA: Diagnosis not present

## 2022-07-24 DIAGNOSIS — D72829 Elevated white blood cell count, unspecified: Secondary | ICD-10-CM | POA: Diagnosis not present

## 2022-07-24 DIAGNOSIS — R8290 Unspecified abnormal findings in urine: Secondary | ICD-10-CM | POA: Diagnosis not present

## 2022-07-24 DIAGNOSIS — R0602 Shortness of breath: Secondary | ICD-10-CM

## 2022-07-24 DIAGNOSIS — R112 Nausea with vomiting, unspecified: Secondary | ICD-10-CM

## 2022-07-24 LAB — CBC WITH DIFFERENTIAL/PLATELET
Basophils Absolute: 0 10*3/uL (ref 0.0–0.2)
Basos: 0 %
EOS (ABSOLUTE): 0 10*3/uL (ref 0.0–0.4)
Eos: 0 %
Hematocrit: 41.8 % (ref 34.0–46.6)
Hemoglobin: 14.1 g/dL (ref 11.1–15.9)
Immature Grans (Abs): 0 10*3/uL (ref 0.0–0.1)
Immature Granulocytes: 0 %
Lymphocytes Absolute: 2 10*3/uL (ref 0.7–3.1)
Lymphs: 17 %
MCH: 29.4 pg (ref 26.6–33.0)
MCHC: 33.7 g/dL (ref 31.5–35.7)
MCV: 87 fL (ref 79–97)
Monocytes Absolute: 1 10*3/uL — ABNORMAL HIGH (ref 0.1–0.9)
Monocytes: 9 %
Neutrophils Absolute: 8.7 10*3/uL — ABNORMAL HIGH (ref 1.4–7.0)
Neutrophils: 74 %
Platelets: 267 10*3/uL (ref 150–450)
RBC: 4.79 x10E6/uL (ref 3.77–5.28)
RDW: 12.7 % (ref 11.7–15.4)
WBC: 11.8 10*3/uL — ABNORMAL HIGH (ref 3.4–10.8)

## 2022-07-24 LAB — POCT URINALYSIS DIP (PROADVANTAGE DEVICE)
Glucose, UA: NEGATIVE mg/dL
Leukocytes, UA: NEGATIVE
Nitrite, UA: NEGATIVE
Protein Ur, POC: 100 mg/dL — AB
Specific Gravity, Urine: 1.03
Urobilinogen, Ur: NEGATIVE
pH, UA: 6 (ref 5.0–8.0)

## 2022-07-24 MED ORDER — ONDANSETRON 4 MG PO TBDP
4.0000 mg | ORAL_TABLET | Freq: Three times a day (TID) | ORAL | 0 refills | Status: DC | PRN
  Filled 2022-07-24: qty 30, 10d supply, fill #0

## 2022-07-24 MED ORDER — AMOXICILLIN 875 MG PO TABS: 875.0000 mg | ORAL_TABLET | Freq: Two times a day (BID) | ORAL | 0 refills | Status: DC

## 2022-07-24 NOTE — Patient Instructions (Signed)
Please go to [facility] for your chest xray.   Their hours are 8am - 4:30 pm Monday - Friday.  Take your insurance card with you. ° °[facility] °[phone] ° °[address], [address] °[place], [place] [address] ° °[address] °[place], [place] [address] ° ° °

## 2022-07-24 NOTE — Progress Notes (Signed)
Subjective:     Patient ID: Dorothy Wood, female   DOB: [birth year], 61 y.o.   MRN: [id number]  This visit type was conducted due to national recommendations for restrictions regarding the COVID-19 Pandemic (e.g. social distancing) in an effort to limit this patient's exposure and mitigate transmission in our community.  Due to their co-morbid illnesses, this patient is at least at moderate risk for complications without adequate follow up.  This format is felt to be most appropriate for this patient at this time.    Documentation for virtual audio and video telecommunications through [place] encounter:  The patient was located at home. The provider was located in the office. The patient did consent to this visit and is aware of possible charges through their insurance for this visit.  The other persons participating in this telemedicine service were daughter Time spent on call was 20 minutes and in review of previous records 20 minutes total.  This virtual service is not related to other E/M service within previous 7 days.   HPI Chief Complaint  Patient presents with   extreme nausea    Nausea 2 nights ago. Started suddenly, went to ER 2 nights ago and was giving fluids. Still vomits when eating or drinking or eating. Prolong QT when taking anitnausea, Stomach pains some, very SOB   Virtual consult.    Been feeling bad since Wednesday night 2 days ago.  Started getting sick with nausea, legs felt weak, nausea, vomiting.  Has felt SOB.  Had some chest pain and SOB while at the emergency dept.    Went to the emergency dept, [facility] same night she started feeling sick.   No fever.  Has had body aches, chills.  No cough, no runny nose ,no sneezing, no congestion until today.  She has developed some cough and congestion today.   No back pain.   No urine changes.  Not urinating a lot since not drinking a lot but no burning, no frequency, no urgency.   No diarrhea.   No recent  travel.  No recent covid test.  No UTI in a long time, over a year ago.   Used Zofran last night from her husband's medication.  Also used an ativan.  Husband in in the hospital with pancreatic cancer and GI bleed.   No recent problems with bowel movements.  She did not take some of her medicines yesterday or today due to feeling well and throwing up.  But prior to that she was can consistent with her medications  Non-smoker, no recent alcohol.  No other aggravating or relieving factors. No other complaint.   Past Medical History:  Diagnosis Date   Arthritis    Cancer ([place])    OVARIAN   Colonic polyp    Depression    Gastritis    Hyperlipidemia    Hypertension    Hypothyroidism    Sjogren's disease ([place])    Stroke ([place])    Thyroid disease    HYPOTHYROIDISM   TIA (transient ischemic attack) 04/06/2021   Current Outpatient Medications on File Prior to Visit  Medication Sig Dispense Refill   aspirin EC 81 MG tablet Take 81 mg by mouth every morning. Swallow whole.     atorvastatin (LIPITOR) 80 MG tablet Take 1 tablet (80 mg total) by mouth daily. 90 tablet 0   buPROPion (WELLBUTRIN XL) 150 MG 24 hr tablet Take 1 tablet (150 mg total) by mouth in the morning. 30 tablet 1  ezetimibe (ZETIA) 10 MG tablet Take 1 tablet by mouth daily. 90 tablet 3   guanFACINE (TENEX) 2 MG tablet Take 1 tablet (2 mg total) by mouth in the morning and at bedtime. 180 tablet 1   levothyroxine (EUTHYROX) 88 MCG tablet TAKE 1 TABLET BY MOUTH ONCE A DAY 90 tablet 3   LORazepam (ATIVAN) 1 MG tablet Take 1 tablet by mouth daily as needed for anxiety (30 day supply) 30 tablet 0   omeprazole (PRILOSEC) 20 MG capsule Take 1 capsule (20 mg total) by mouth 2 (two) times daily before a meal for 14 days. 28 capsule 0   pilocarpine (SALAGEN) 5 MG tablet TAKE 1 TABLET BY MOUTH 4 TIMES DAILY 360 tablet 1   polyethylene glycol (MIRALAX / GLYCOLAX) 17 g packet Take 8.5 g by mouth daily as needed (constipation.).      propranolol ER (INDERAL LA) 60 MG 24 hr capsule Take 1 capsule (60 mg total) by mouth daily. 30 capsule 3   sertraline (ZOLOFT) 100 MG tablet Take 1 tablet (100 mg total) by mouth in the morning. 30 tablet 1   [DISCONTINUED] estradiol (ESTRACE) 0.5 MG tablet TAKE 1 TABLET BY MOUTH ONCE A DAY (Patient not taking: No sig reported) 30 tablet 2   No current facility-administered medications on file prior to visit.   Past Surgical History:  Procedure Laterality Date   ABDOMINAL HYSTERECTOMY     Ovarian cancer   BIOPSY  02/04/2021   Procedure: BIOPSY;  Surgeon: Ronald Lobo, MD;  Location: [facility];  Service: Endoscopy;;   BREAST CYST ASPIRATION     CESAREAN SECTION     x2   CHOLECYSTECTOMY N/A 01/23/2022   Procedure: LAPAROSCOPIC CHOLECYSTECTOMY WITH INTRAOPERATIVE CHOLANGIOGRAM;  Surgeon: Armandina Gemma, MD;  Location: [facility];  Service: General;  Laterality: N/A;   COLONOSCOPY  10-06   Dr. Earnest Bailey   ESOPHAGOGASTRODUODENOSCOPY N/A 02/04/2021   Procedure: ESOPHAGOGASTRODUODENOSCOPY (EGD);  Surgeon: Ronald Lobo, MD;  Location: [facility];  Service: Endoscopy;  Laterality: N/A;   TONSILLECTOMY      Review of Systems As in subjective      Objective:   Physical Exam Due to coronavirus pandemic stay at home measures, patient visit was virtual and they were not examined in person.   BP (!) 140/100   Pulse 84   Temp 99.1 F (37.3 C)   Resp 16   Wt 125 lb (56.7 kg)   SpO2 99%   BMI 20.80 kg/m   Wt Readings from Last 3 Encounters:  07/24/22 125 lb (56.7 kg)  06/29/22 129 lb 3.2 oz (58.6 kg)  06/18/22 125 lb (56.7 kg)   BP Readings from Last 3 Encounters:  07/24/22 (!) 140/100  07/23/22 139/68  06/29/22 117/60    Gen: wd, wn, lying in bed Seems a little tachypnea, anxious Answers questions appropriately though   In person exam later today: General appearence: alert well-developed well-nourished, lying on exam table somewhat ill-appearing HEENT: normocephalic,  sclerae anicteric, TMs pearly, nares patent, no discharge or erythema, pharynx normal Oral cavity: MMM, no lesions Neck: supple, no lymphadenopathy, no thyromegaly, no masses Heart: RRR, normal S1, S2, no murmurs Lungs: CTA bilaterally, no wheezes, rhonchi, or rales Abdomen: +bs, soft, mild generalized discomfort on palpation, otherwise non tender, non distended, no masses, no hepatomegaly, no splenomegaly Back nontender Pulses: 2+ symmetric, upper and lower extremities, normal cap refill      Assessment:     Encounter Diagnoses  Name Primary?   Nausea and vomiting,  unspecified vomiting type Yes   SOB (shortness of breath)    Anxiety    Leukocytosis, unspecified type    Abnormal urinalysis        Plan:     We discussed limitations of virtual consult.  I reviewed her recent emergency department notes from 07/22/2022.  She had labs including comprehensive metabolic panel, CBC with differential, magnesium, lipase, troponin, urinalysis, EKG, CT abdomen pelvis.  Notable findings with slightly elevated white count, urinalysis showing high specific gravity and a pH of 9 although she looking back through her records has maintained a pH of 9 on a regular basis for some reason  The hospital notes suggested that etiology was that she was having more of an anxiety issue given that her husband is currently not doing well in the hospital with pancreatic cancer and a new GI bleed.  Advise she do a COVID test either having her daughter go get one or possibly coming to our back parking lot if possible  Advised it would be helpful to see her in person and examine if able to in a couple hours.  Consider covid test, chest xray, repeat UA and CBC.    For now she will do a Zofran, wait 30 minutes and then try to get some fluids on board.  She is dehydrated.  She also using Ativan to help calm her nerves.   She came in person later in the afternoon.  Negative COVID test in the parking lot. After  examination, advised urine still abnormal, we will send for urine culture, she will go for xray.  CBC stat sent.  At this point advised if any worse of the weekend particularly if uncontrollable vomiting, lack of hydration, worse pain or fever or not feeling well to go to emergency department again.  She was given 1 bag of IV fluids in the emergency department 2 days ago.  Continue Zofran ODT every 4-6 hours as needed, continue efforts to try to hydrate.    Charish was seen today for extreme nausea.  Diagnoses and all orders for this visit:  Nausea and vomiting, unspecified vomiting type -     CBC with Differential/Platelet -     DG Chest 2 View; Future  SOB (shortness of breath) -     CBC with Differential/Platelet -     DG Chest 2 View; Future  Anxiety  Leukocytosis, unspecified type -     CBC with Differential/Platelet -     DG Chest 2 View; Future -     Urine Culture -     POCT Urinalysis DIP (Proadvantage Device)  Abnormal urinalysis -     Urine Culture -     POCT Urinalysis DIP (Proadvantage Device)  Other orders -     ondansetron (ZOFRAN-ODT) 4 MG disintegrating tablet; Take 1 tablet (4 mg total) by mouth every 8 (eight) hours as needed for nausea or vomiting.  F/u pending labs, chest x-ray

## 2022-07-27 ENCOUNTER — Other Ambulatory Visit (HOSPITAL_COMMUNITY): Payer: Self-pay

## 2022-07-27 DIAGNOSIS — F4323 Adjustment disorder with mixed anxiety and depressed mood: Secondary | ICD-10-CM | POA: Diagnosis not present

## 2022-07-27 DIAGNOSIS — F902 Attention-deficit hyperactivity disorder, combined type: Secondary | ICD-10-CM | POA: Diagnosis not present

## 2022-07-27 DIAGNOSIS — F419 Anxiety disorder, unspecified: Secondary | ICD-10-CM | POA: Diagnosis not present

## 2022-07-27 DIAGNOSIS — G4700 Insomnia, unspecified: Secondary | ICD-10-CM | POA: Diagnosis not present

## 2022-07-27 LAB — URINE CULTURE

## 2022-07-27 MED ORDER — BUPROPION HCL ER (XL) 150 MG PO TB24
150.0000 mg | ORAL_TABLET | Freq: Every morning | ORAL | 1 refills | Status: DC
  Filled 2022-07-27: qty 90, 90d supply, fill #0
  Filled 2022-11-02: qty 90, 90d supply, fill #1

## 2022-07-27 MED ORDER — AZSTARYS 26.1-5.2 MG PO CAPS
ORAL_CAPSULE | Freq: Every day | ORAL | 0 refills | Status: DC
  Filled 2022-08-01: qty 30, 30d supply, fill #0

## 2022-07-27 MED ORDER — AZSTARYS 26.1-5.2 MG PO CAPS: ORAL_CAPSULE | ORAL | 0 refills | Status: DC

## 2022-07-27 MED ORDER — LORAZEPAM 1 MG PO TABS
ORAL_TABLET | ORAL | 2 refills | Status: DC
  Filled 2022-07-27: qty 30, 30d supply, fill #0
  Filled 2022-07-29: qty 30, fill #0
  Filled 2022-08-03: qty 30, 30d supply, fill #0
  Filled 2022-08-27 – 2022-09-02 (×2): qty 30, 30d supply, fill #1
  Filled 2022-10-01: qty 30, 30d supply, fill #2

## 2022-07-27 MED ORDER — GUANFACINE HCL 2 MG PO TABS
2.0000 mg | ORAL_TABLET | ORAL | 1 refills | Status: AC
  Filled 2022-07-27 – 2023-01-19 (×3): qty 180, 90d supply, fill #0
  Filled 2023-02-22: qty 60, 30d supply, fill #0

## 2022-07-27 MED ORDER — SERTRALINE HCL 100 MG PO TABS
100.0000 mg | ORAL_TABLET | Freq: Every morning | ORAL | 1 refills | Status: DC
  Filled 2022-07-27: qty 90, 90d supply, fill #0

## 2022-07-28 ENCOUNTER — Other Ambulatory Visit (HOSPITAL_COMMUNITY): Payer: Self-pay

## 2022-07-28 ENCOUNTER — Other Ambulatory Visit: Payer: Self-pay | Admitting: Medical

## 2022-07-28 ENCOUNTER — Telehealth: Payer: Self-pay

## 2022-07-28 MED ORDER — BACLOFEN 10 MG PO TABS
ORAL_TABLET | ORAL | 0 refills | Status: DC
  Filled 2022-07-28: qty 20, 10d supply, fill #0

## 2022-07-28 NOTE — Telephone Encounter (Signed)
I spoke with the pt. Yesterday regarding her lab work and your recommendations. She stated she would increase her omeprazole and would take any recommendations as far as her nausea and hiccups went. She would try any medicine for that you thought would be ok for her to take.

## 2022-07-29 ENCOUNTER — Other Ambulatory Visit: Payer: Self-pay | Admitting: Family Medicine

## 2022-07-29 ENCOUNTER — Other Ambulatory Visit (HOSPITAL_COMMUNITY): Payer: Self-pay

## 2022-07-29 MED ORDER — ATORVASTATIN CALCIUM 80 MG PO TABS
80.0000 mg | ORAL_TABLET | Freq: Every day | ORAL | 0 refills | Status: DC
  Filled 2022-07-29: qty 90, 90d supply, fill #0

## 2022-07-30 ENCOUNTER — Ambulatory Visit (INDEPENDENT_AMBULATORY_CARE_PROVIDER_SITE_OTHER): Payer: 59 | Admitting: Medical

## 2022-07-30 ENCOUNTER — Ambulatory Visit: Payer: 59 | Admitting: Behavioral Health

## 2022-07-30 ENCOUNTER — Encounter: Payer: Self-pay | Admitting: Medical

## 2022-07-30 VITALS — Temp 98.0°F | Wt 121.6 lb

## 2022-07-30 DIAGNOSIS — R6883 Chills (without fever): Secondary | ICD-10-CM | POA: Diagnosis not present

## 2022-07-30 DIAGNOSIS — E860 Dehydration: Secondary | ICD-10-CM

## 2022-07-30 DIAGNOSIS — R112 Nausea with vomiting, unspecified: Secondary | ICD-10-CM | POA: Diagnosis not present

## 2022-07-30 LAB — COMPREHENSIVE METABOLIC PANEL
ALT: 27 IU/L (ref 0–32)
AST: 20 IU/L (ref 0–40)
Albumin/Globulin Ratio: 2.4 — ABNORMAL HIGH (ref 1.2–2.2)
Albumin: 4.5 g/dL (ref 3.9–4.9)
Alkaline Phosphatase: 92 IU/L (ref 44–121)
BUN/Creatinine Ratio: 18 (ref 12–28)
BUN: 13 mg/dL (ref 8–27)
Bilirubin Total: 0.5 mg/dL (ref 0.0–1.2)
CO2: 25 mmol/L (ref 20–29)
Calcium: 9.9 mg/dL (ref 8.7–10.3)
Chloride: 102 mmol/L (ref 96–106)
Creatinine, Ser: 0.72 mg/dL (ref 0.57–1.00)
Globulin, Total: 1.9 g/dL (ref 1.5–4.5)
Glucose: 115 mg/dL — ABNORMAL HIGH (ref 70–99)
Potassium: 3.9 mmol/L (ref 3.5–5.2)
Sodium: 139 mmol/L (ref 134–144)
Total Protein: 6.4 g/dL (ref 6.0–8.5)
eGFR: 95 mL/min/{1.73_m2} (ref >59)

## 2022-07-30 LAB — POCT URINALYSIS DIP (CLINITEK)
Blood, UA: NEGATIVE
Glucose, UA: NEGATIVE mg/dL
Leukocytes, UA: NEGATIVE
Nitrite, UA: NEGATIVE
Spec Grav, UA: 1.025 (ref 1.010–1.025)
Urobilinogen, UA: 0.2 E.U./dL
pH, UA: 6 (ref 5.0–8.0)

## 2022-07-30 LAB — CBC WITH DIFFERENTIAL/PLATELET
Basophils Absolute: 0 10*3/uL (ref 0.0–0.2)
Basos: 0 %
EOS (ABSOLUTE): 0 10*3/uL (ref 0.0–0.4)
Eos: 0 %
Hematocrit: 43.3 % (ref 34.0–46.6)
Hemoglobin: 14.8 g/dL (ref 11.1–15.9)
Immature Grans (Abs): 0 10*3/uL (ref 0.0–0.1)
Immature Granulocytes: 0 %
Lymphocytes Absolute: 1.6 10*3/uL (ref 0.7–3.1)
Lymphs: 21 %
MCH: 30.3 pg (ref 26.6–33.0)
MCHC: 34.2 g/dL (ref 31.5–35.7)
MCV: 89 fL (ref 79–97)
Monocytes Absolute: 0.4 10*3/uL (ref 0.1–0.9)
Monocytes: 5 %
Neutrophils Absolute: 5.3 10*3/uL (ref 1.4–7.0)
Neutrophils: 74 %
Platelets: 273 10*3/uL (ref 150–450)
RBC: 4.89 x10E6/uL (ref 3.77–5.28)
RDW: 12.5 % (ref 11.7–15.4)
WBC: 7.4 10*3/uL (ref 3.4–10.8)

## 2022-07-30 NOTE — Progress Notes (Signed)
Subjective:     Patient ID: Dorothy Wood, female   DOB: [birth year], 61 y.o.   MRN: [id number]  HPI Chief Complaint  Patient presents with   other    Still sick and vomiting, this morning was feeling better than about an hour after talking to [place] here this am started throwing up again, no diarrhea, nausea, vomiting a not gutt hurts, freezing, a little cough, Pediasure and Pedialyte, water, ate tangerine and they came right up, has not had a bowel movement in 2-3 days,    Here for recheck if not feeling well.  Initially did a consult 07/24/2022 here with me.  She was seen prior to that on 07/22/2022 at the emergency department.  In the last few days she will have periods where she feels pretty good but then will have nausea and vomiting and feels bad again.  She is not keeping a whole lot down.  She has been trying to do Pedialyte and fluids.  No bowel movement in 2 to 3 days.  She feels cold with chills.  She is afraid to use nausea medication due to history of long QT concerns, so only using ativan periodically, not zofran.  Symptoms began on 07/22/2022 with nausea, feeling weak, vomiting, a little short of breath.  She has been shortness of breath and chest pain in the hospital emergency department.  When she was seen in the emergency department at that time she had not had any fever but did have body aches, chills.  Initially no cough, no runny nose ,no sneezing, no congestion until today.  By the time she came back to our office 2 days later she had developed some cough and congestion.  At that time she denied back pain, no urinary changes, no urinary frequency or urgency or blood in the urine.  No diarrhea.  No recent travel.  No UTI in a long time, over a year ago.     Her husband is in the hospital with pancreatic cancer and a GI bleed and not being well.  She was definitely having some anxiety about this and has been using some Ativan.  Non-smoker, no recent alcohol.  No other aggravating or  relieving factors. No other complaint.   Past Medical History:  Diagnosis Date   Arthritis    Cancer ([place])    OVARIAN   Colonic polyp    Depression    Gastritis    Hyperlipidemia    Hypertension    Hypothyroidism    Sjogren's disease ([place])    Stroke ([place])    Thyroid disease    HYPOTHYROIDISM   TIA (transient ischemic attack) 04/06/2021   Current Outpatient Medications on File Prior to Visit  Medication Sig Dispense Refill   amoxicillin (AMOXIL) 875 MG tablet Take 1 tablet (875 mg total) by mouth 2 (two) times daily. 14 tablet 0   aspirin EC 81 MG tablet Take 81 mg by mouth every morning. Swallow whole.     atorvastatin (LIPITOR) 80 MG tablet Take 1 tablet (80 mg total) by mouth daily. 90 tablet 0   baclofen (LIORESAL) 10 MG tablet Take 1 tablet 1 - 2 times daily for hiccups 20 tablet 0   buPROPion (WELLBUTRIN XL) 150 MG 24 hr tablet Take 1 tablet (150 mg total) by mouth in the morning. 90 tablet 1   ezetimibe (ZETIA) 10 MG tablet Take 1 tablet by mouth daily. 90 tablet 3   guanFACINE (TENEX) 2 MG tablet Take 1 tablet (2 mg  total) by mouth in the morning and at bedtime. 180 tablet 1   guanFACINE (TENEX) 2 MG tablet Take 1 tablet (2 mg total) by mouth 2 (two) times daily in the morning and at bedtime. 180 tablet 1   levothyroxine (EUTHYROX) 88 MCG tablet TAKE 1 TABLET BY MOUTH ONCE A DAY 90 tablet 3   LORazepam (ATIVAN) 1 MG tablet Take 1 tablet by mouth daily as needed for anxiety (30 day supply) 30 tablet 0   LORazepam (ATIVAN) 1 MG tablet Take 1 tablet by mouth daily as needed for anxiety (30 day supply) 30 tablet 2   omeprazole (PRILOSEC) 20 MG capsule Take 1 capsule (20 mg total) by mouth 2 (two) times daily before a meal for 14 days. 28 capsule 0   ondansetron (ZOFRAN-ODT) 4 MG disintegrating tablet Take 1 tablet (4 mg total) by mouth every 8 (eight) hours as needed for nausea or vomiting. 30 tablet 0   pilocarpine (SALAGEN) 5 MG tablet TAKE 1 TABLET BY MOUTH 4 TIMES DAILY 360  tablet 1   polyethylene glycol (MIRALAX / GLYCOLAX) 17 g packet Take 8.5 g by mouth daily as needed (constipation.).     propranolol ER (INDERAL LA) 60 MG 24 hr capsule Take 1 capsule (60 mg total) by mouth daily. 30 capsule 3   [START ON 08/26/2022] Serdexmethylphen-Dexmethylphen (AZSTARYS) 26.1-5.2 MG CAPS Take 1 capsule by mouth every morning. 30 capsule 0   Serdexmethylphen-Dexmethylphen (AZSTARYS) 26.1-5.2 MG CAPS Take 1 capsule by mouth every morning. 30 capsule 0   sertraline (ZOLOFT) 100 MG tablet Take 1 tablet (100 mg total) by mouth in the morning. 30 tablet 1   sertraline (ZOLOFT) 100 MG tablet Take 1 tablet (100 mg total) by mouth in the morning. 90 tablet 1   [DISCONTINUED] estradiol (ESTRACE) 0.5 MG tablet TAKE 1 TABLET BY MOUTH ONCE A DAY (Patient not taking: No sig reported) 30 tablet 2   No current facility-administered medications on file prior to visit.   Past Surgical History:  Procedure Laterality Date   ABDOMINAL HYSTERECTOMY     Ovarian cancer   BIOPSY  02/04/2021   Procedure: BIOPSY;  Surgeon: Ronald Lobo, MD;  Location: [facility];  Service: Endoscopy;;   BREAST CYST ASPIRATION     CESAREAN SECTION     x2   CHOLECYSTECTOMY N/A 01/23/2022   Procedure: LAPAROSCOPIC CHOLECYSTECTOMY WITH INTRAOPERATIVE CHOLANGIOGRAM;  Surgeon: Armandina Gemma, MD;  Location: [facility];  Service: General;  Laterality: N/A;   COLONOSCOPY  10-06   Dr. Earnest Bailey   ESOPHAGOGASTRODUODENOSCOPY N/A 02/04/2021   Procedure: ESOPHAGOGASTRODUODENOSCOPY (EGD);  Surgeon: Ronald Lobo, MD;  Location: [facility];  Service: Endoscopy;  Laterality: N/A;   TONSILLECTOMY      Review of Systems As in subjective      Objective:   Physical Exam Temp 98 F (36.7 C)   Wt 121 lb 9.6 oz (55.2 kg)   SpO2 97%   BMI 20.24 kg/m   Wt Readings from Last 3 Encounters:  07/30/22 121 lb 9.6 oz (55.2 kg)  07/24/22 125 lb (56.7 kg)  06/29/22 129 lb 3.2 oz (58.6 kg)   BP Readings from Last 3 Encounters:   07/24/22 (!) 140/100  07/23/22 139/68  06/29/22 117/60   General appearence: ill appearing, alert, well-developed well-nourished, lying on exam table  HEENT: normocephalic, sclerae anicteric, pharynx normal Oral cavity: dry MM, no lesions Neck: supple, no lymphadenopathy, no thyromegaly, no masses Heart: RRR, normal S1, S2, no murmurs Lungs: CTA bilaterally, no wheezes, rhonchi,  or rales Abdomen: +bs, soft, mild generalized discomfort on palpation, otherwise non tender, non distended, no masses, no hepatomegaly, no splenomegaly Back nontender Pulses: 2+ symmetric, upper and lower extremities, normal cap refill      Assessment:     Encounter Diagnoses  Name Primary?   Nausea and vomiting, unspecified vomiting type Yes   Dehydration    Chills       Plan:     We discussed her symptoms and exam findings.  Also discussed the case with supervising physician Dr. Tomi Bamberger.  We again reviewed her recent emergency department notes from 07/22/2022.  She had labs including comprehensive metabolic panel, CBC with differential, magnesium, lipase, troponin, urinalysis, EKG, CT abdomen pelvis.  Notable findings with slightly elevated white count, urinalysis showing high specific gravity and a pH of 9 although she looking back through her records has maintained a pH of 9 on a regular basis for some reason  The hospital notes suggested that etiology was that she was having gastritis and more of an anxiety issue given that her husband is currently not doing well in the hospital with pancreatic cancer and a new GI bleed.   She was increased dose of omeprazole and added Pepcid.     07/24/22 -  Negative COVID test in the parking lot.  UA was abnormal, urine culture came back with mixed flora less than 10,000 colonies/ml,  and Chest xray was normal 07/24/22  At this point she still appears dehydrated still doing with nausea and vomiting.  Surprisingly she is not using the Zofran.  Other than dehydration  her urinalysis did actually look better.  She is not orthostatic today.  I advise she go home and do an 8 mg Zofran ODT now, work on getting fluids throughout the next several days to hydrate up, can use Zofran every 4-6 hours.  Reassured her not to be afraid to use this.  We have avoided promethazine and Compazine given the long QT concern that we advised to go ahead and use the Zofran  Continue with omeprazole 40 mg daily and then Pepcid twice daily.  Rest and keep working on hydration.  Stat labs today  I advised if not able to keep anything down or continue to feel this bad in the next 24 hours go to the hospital for IV fluids and evaluation.    Brita was seen today for other.  Diagnoses and all orders for this visit:  Nausea and vomiting, unspecified vomiting type -     Comprehensive metabolic panel -     CBC with Differential/Platelet  Dehydration -     Comprehensive metabolic panel -     CBC with Differential/Platelet  Chills -     Comprehensive metabolic panel -     CBC with Differential/Platelet   F/u pending labs

## 2022-07-30 NOTE — Progress Notes (Unsigned)
                Dorothy Wood [name], LMFT 

## 2022-07-30 NOTE — Patient Instructions (Signed)
You are dehydrated, still vomiting but not orthostatic, and urine actually improved looking today  I am checking some STAT labs today for liver, kidney, electrolytes, blood counts  Recommendations At any point in the next 24 hours if you are just not able to keep anything down then go to the emergency department for evaluation and IV fluids  Don't be afraid to take Zofran Use the '8mg'$  Zofran you have when you get home After that dose, you can use either '4mg'$  or '8mg'$  Zofran every 4-6 hours Hydrate a little bit at a time until the nausea is settling down Continue water, Pedialyte or other clear fluids Continue Omeprazole '40mg'$  daily and Pepcid twice daily REST

## 2022-08-01 ENCOUNTER — Other Ambulatory Visit (HOSPITAL_COMMUNITY): Payer: Self-pay

## 2022-08-03 ENCOUNTER — Other Ambulatory Visit (HOSPITAL_COMMUNITY): Payer: Self-pay

## 2022-08-03 ENCOUNTER — Other Ambulatory Visit: Payer: Self-pay | Admitting: Family Medicine

## 2022-08-03 MED ORDER — METHYLPHENIDATE HCL ER (OSM) 36 MG PO TBCR
36.0000 mg | EXTENDED_RELEASE_TABLET | Freq: Every morning | ORAL | 0 refills | Status: DC
  Filled 2022-08-03: qty 30, 30d supply, fill #0

## 2022-08-04 ENCOUNTER — Other Ambulatory Visit (HOSPITAL_COMMUNITY): Payer: Self-pay

## 2022-08-05 ENCOUNTER — Other Ambulatory Visit (HOSPITAL_COMMUNITY): Payer: Self-pay

## 2022-08-10 ENCOUNTER — Other Ambulatory Visit (HOSPITAL_COMMUNITY): Payer: Self-pay

## 2022-08-10 DIAGNOSIS — K2100 Gastro-esophageal reflux disease with esophagitis, without bleeding: Secondary | ICD-10-CM | POA: Diagnosis not present

## 2022-08-10 DIAGNOSIS — R112 Nausea with vomiting, unspecified: Secondary | ICD-10-CM | POA: Diagnosis not present

## 2022-08-10 MED ORDER — ONDANSETRON HCL 4 MG PO TABS
4.0000 mg | ORAL_TABLET | Freq: Four times a day (QID) | ORAL | 1 refills | Status: DC | PRN
  Filled 2022-08-10: qty 90, 23d supply, fill #0

## 2022-08-10 MED ORDER — PANTOPRAZOLE SODIUM 40 MG PO TBEC
40.0000 mg | DELAYED_RELEASE_TABLET | Freq: Two times a day (BID) | ORAL | 3 refills | Status: DC
  Filled 2022-08-10: qty 60, 30d supply, fill #0
  Filled 2022-09-04: qty 60, 30d supply, fill #1
  Filled 2022-11-18: qty 60, 30d supply, fill #2
  Filled 2023-01-14: qty 60, 30d supply, fill #3

## 2022-08-10 NOTE — Progress Notes (Deleted)
HPI: FU hypertension and family h/o CAD.  Calcium score February 2021 0.  Echocardiogram August 2021 showed normal LV function.  Brain MRI/MRA August 2021 normal.  Echocardiogram July 2022 showed normal LV function with no significant valvular abnormality.  Carotid Dopplers August 2023 near normal bilaterally.  Since last seen,   Current Outpatient Medications  Medication Sig Dispense Refill   amoxicillin (AMOXIL) 875 MG tablet Take 1 tablet (875 mg total) by mouth 2 (two) times daily. 14 tablet 0   aspirin EC 81 MG tablet Take 81 mg by mouth every morning. Swallow whole.     atorvastatin (LIPITOR) 80 MG tablet Take 1 tablet (80 mg total) by mouth daily. 90 tablet 0   baclofen (LIORESAL) 10 MG tablet Take 1 tablet 1 - 2 times daily for hiccups 20 tablet 0   buPROPion (WELLBUTRIN XL) 150 MG 24 hr tablet Take 1 tablet (150 mg total) by mouth in the morning. 90 tablet 1   ezetimibe (ZETIA) 10 MG tablet Take 1 tablet by mouth daily. 90 tablet 3   guanFACINE (TENEX) 2 MG tablet Take 1 tablet (2 mg total) by mouth in the morning and at bedtime. 180 tablet 1   guanFACINE (TENEX) 2 MG tablet Take 1 tablet (2 mg total) by mouth 2 (two) times daily in the morning and at bedtime. 180 tablet 1   levothyroxine (EUTHYROX) 88 MCG tablet TAKE 1 TABLET BY MOUTH ONCE A DAY 90 tablet 3   LORazepam (ATIVAN) 1 MG tablet Take 1 tablet by mouth daily as needed for anxiety (30 day supply) 30 tablet 0   LORazepam (ATIVAN) 1 MG tablet Take 1 tablet by mouth daily as needed for anxiety (30 day supply) 30 tablet 2   methylphenidate 36 MG PO CR tablet Take 1 tablet (36 mg total) by mouth in the morning. 30 tablet 0   omeprazole (PRILOSEC) 20 MG capsule Take 1 capsule (20 mg total) by mouth 2 (two) times daily before a meal for 14 days. 28 capsule 0   ondansetron (ZOFRAN-ODT) 4 MG disintegrating tablet Take 1 tablet (4 mg total) by mouth every 8 (eight) hours as needed for nausea or vomiting. 30 tablet 0   pilocarpine  (SALAGEN) 5 MG tablet TAKE 1 TABLET BY MOUTH 4 TIMES DAILY 360 tablet 1   polyethylene glycol (MIRALAX / GLYCOLAX) 17 g packet Take 8.5 g by mouth daily as needed (constipation.).     propranolol ER (INDERAL LA) 60 MG 24 hr capsule Take 1 capsule (60 mg total) by mouth daily. 30 capsule 3   [START ON 08/26/2022] Serdexmethylphen-Dexmethylphen (AZSTARYS) 26.1-5.2 MG CAPS Take 1 capsule by mouth every morning. 30 capsule 0   Serdexmethylphen-Dexmethylphen (AZSTARYS) 26.1-5.2 MG CAPS Take 1 capsule by mouth every morning. 30 capsule 0   sertraline (ZOLOFT) 100 MG tablet Take 1 tablet (100 mg total) by mouth in the morning. 30 tablet 1   sertraline (ZOLOFT) 100 MG tablet Take 1 tablet (100 mg total) by mouth in the morning. 90 tablet 1   No current facility-administered medications for this visit.     Past Medical History:  Diagnosis Date   Arthritis    Cancer ([place])    OVARIAN   Colonic polyp    Depression    Gastritis    Hyperlipidemia    Hypertension    Hypothyroidism    Sjogren's disease ([place])    Stroke [facility])    Thyroid disease    HYPOTHYROIDISM   TIA (transient  ischemic attack) 04/06/2021    Past Surgical History:  Procedure Laterality Date   ABDOMINAL HYSTERECTOMY     Ovarian cancer   BIOPSY  02/04/2021   Procedure: BIOPSY;  Surgeon: Ronald Lobo, MD;  [facility];  Service: Endoscopy;;   BREAST CYST ASPIRATION     CESAREAN SECTION     x2   CHOLECYSTECTOMY N/A 01/23/2022   Procedure: LAPAROSCOPIC CHOLECYSTECTOMY WITH INTRAOPERATIVE CHOLANGIOGRAM;  Surgeon: Armandina Gemma, MD;  Location: [facility];  Service: General;  Laterality: N/A;   COLONOSCOPY  10-06   Dr. Earnest Bailey   ESOPHAGOGASTRODUODENOSCOPY N/A 02/04/2021   Procedure: ESOPHAGOGASTRODUODENOSCOPY (EGD);  Surgeon: Ronald Lobo, MD;  Location: [facility];  Service: Endoscopy;  Laterality: N/A;   TONSILLECTOMY      Social History   Socioeconomic History   Marital status: Married    Spouse name: Not on  file   Number of children: 2   Years of education: Not on file   Highest education level: Not on file  Occupational History   Not on file  Tobacco Use   Smoking status: Never   Smokeless tobacco: Never  Vaping Use   Vaping Use: Never used  Substance and Sexual Activity   Alcohol use: Yes    Alcohol/week: 4.0 standard drinks of alcohol    Types: 4 Glasses of wine per week    Comment: 05/21/21 1 glasses wine 3 x week   Drug use: No   Sexual activity: Not Currently  Other Topics Concern   Not on file  Social History Narrative   Lives with spouse   Social Determinants of Health   Financial Resource Strain: Not on file  Food Insecurity: No Food Insecurity (01/27/2022)   Hunger Vital Sign    Worried About Running Out of Food in the Last Year: Never true    Ran Out of Food in the Last Year: Never true  Transportation Needs: No Transportation Needs (01/27/2022)   PRAPARE - Hydrologist (Medical): No    Lack of Transportation (Non-Medical): No  Physical Activity: Not on file  Stress: Not on file  Social Connections: Not on file  Intimate Partner Violence: Not on file    Family History  Problem Relation Age of Onset   Mental illness Mother    Hypertension Mother    Heart failure Mother    CAD Father        CABG at age 56   Mental illness Sister    Alcoholism Sister    Mental illness Brother    Alcoholism Brother    Mental illness Maternal Uncle    Mental illness Maternal Grandmother    Cancer Paternal Grandmother     ROS: no fevers or chills, productive cough, hemoptysis, dysphasia, odynophagia, melena, hematochezia, dysuria, hematuria, rash, seizure activity, orthopnea, PND, pedal edema, claudication. Remaining systems are negative.  Physical Exam: Well-developed well-nourished in no acute distress.  Skin is warm and dry.  HEENT is normal.  Neck is supple.  Chest is clear to auscultation with normal expansion.  Cardiovascular exam is  regular rate and rhythm.  Abdominal exam nontender or distended. No masses palpated. Extremities show no edema. neuro grossly intact  ECG- personally reviewed  A/P  1 hypertension-patient's blood pressure is controlled today.  Continue present medications.  2 hyperlipidemia-continue statin.  3 family history of coronary artery disease-patient noted to have calcium score of 0 previously.  Continue risk factor modification.  Kirk Ruths, MD

## 2022-08-11 ENCOUNTER — Encounter: Payer: Self-pay | Admitting: Cardiology

## 2022-08-11 ENCOUNTER — Other Ambulatory Visit (HOSPITAL_COMMUNITY): Payer: Self-pay

## 2022-08-12 ENCOUNTER — Encounter: Payer: Self-pay | Admitting: Orthopaedic Surgery

## 2022-08-12 ENCOUNTER — Ambulatory Visit (INDEPENDENT_AMBULATORY_CARE_PROVIDER_SITE_OTHER): Payer: 59 | Admitting: Orthopaedic Surgery

## 2022-08-12 ENCOUNTER — Other Ambulatory Visit (HOSPITAL_COMMUNITY): Payer: Self-pay | Admitting: Physician Assistant

## 2022-08-12 DIAGNOSIS — R112 Nausea with vomiting, unspecified: Secondary | ICD-10-CM

## 2022-08-12 DIAGNOSIS — M65341 Trigger finger, right ring finger: Secondary | ICD-10-CM

## 2022-08-12 NOTE — Progress Notes (Signed)
HPI: Mrs. Harmsen returns today earlier right ring finger that is triggering.  She was seen in the past for left triggering ring finger.  She has had no injuries.  She states the ring finger is much better today than it was couple of weeks ago whenever she scheduled her appointment.  She has been taking some oral ibuprofen.  She is nondiabetic.  She states that her left finger bothers her at times but is much better since undergoing the injection.  She is wondering if she should have an injection of the right finger not at this point time.  She is dealing with caring for her husband who has stage IV pancreatic cancer.  Review of systems: See HPI otherwise negative  Physical exam: General well-developed well-nourished female no acute distress mood affect appropriate.  Bilateral hands she is able to flex and extend all the fingers of both hands.  There is no rashes skin lesions ulcerations or erythema of either hand.  Right hand she has slight triggering of the ring finger and a palpable nodule that is tender at the A1 pulley of the right ring finger.  Left hand no triggering.  Impression: Trigger finger right ring finger  Plan: Offered injection today she defers.  There is where she will apply Voltaren gel 2 g up to 4 times daily to the area of the A1 pulley of the right ring finger.  She will also take ibuprofen daily for 7 days.  Follow-up with [place] if pain persist or becomes worse.  Questions were encouraged and answered.  Surgical intervention was also discussed but again she is caring for her husband and not interested in surgery at this point time.

## 2022-08-13 NOTE — Progress Notes (Signed)
Text from pt. Unable to continue PREP at this time due to illness and caring for husband.  Asked pt to contact me when she feels better and can participate.

## 2022-08-14 ENCOUNTER — Ambulatory Visit: Payer: 59 | Admitting: Behavioral Health

## 2022-08-14 NOTE — Progress Notes (Unsigned)
                Krishang Reading L Babyboy Loya, LMFT 

## 2022-08-20 ENCOUNTER — Encounter: Payer: Self-pay | Admitting: Cardiology

## 2022-08-20 ENCOUNTER — Ambulatory Visit: Payer: 59 | Attending: Cardiology | Admitting: Cardiology

## 2022-08-24 ENCOUNTER — Encounter (HOSPITAL_COMMUNITY)
Admission: RE | Admit: 2022-08-24 | Discharge: 2022-08-24 | Disposition: A | Discharge status: Home / Self Care | Payer: 59 | Source: Ambulatory Visit | Attending: Physician Assistant | Admitting: Physician Assistant

## 2022-08-24 ENCOUNTER — Encounter: Payer: Self-pay | Admitting: Cardiology

## 2022-08-24 DIAGNOSIS — Z0389 Encounter for observation for other suspected diseases and conditions ruled out: Secondary | ICD-10-CM | POA: Diagnosis not present

## 2022-08-24 DIAGNOSIS — R112 Nausea with vomiting, unspecified: Secondary | ICD-10-CM | POA: Insufficient documentation

## 2022-08-24 MED ORDER — TECHNETIUM TC 99M SULFUR COLLOID
1.9700 | Freq: Once | INTRAVENOUS | Status: AC | PRN
  Administered 2022-08-24: 1.97 via ORAL

## 2022-08-25 ENCOUNTER — Encounter: Payer: Self-pay | Admitting: Family Medicine

## 2022-08-25 DIAGNOSIS — Z8543 Personal history of malignant neoplasm of ovary: Secondary | ICD-10-CM

## 2022-08-27 ENCOUNTER — Other Ambulatory Visit (HOSPITAL_COMMUNITY): Payer: Self-pay

## 2022-08-28 ENCOUNTER — Ambulatory Visit (INDEPENDENT_AMBULATORY_CARE_PROVIDER_SITE_OTHER): Payer: 59 | Admitting: Behavioral Health

## 2022-08-28 ENCOUNTER — Other Ambulatory Visit (HOSPITAL_COMMUNITY): Payer: Self-pay

## 2022-08-28 ENCOUNTER — Other Ambulatory Visit: Payer: Self-pay

## 2022-08-28 DIAGNOSIS — F902 Attention-deficit hyperactivity disorder, combined type: Secondary | ICD-10-CM | POA: Diagnosis not present

## 2022-08-28 DIAGNOSIS — F4322 Adjustment disorder with anxiety: Secondary | ICD-10-CM

## 2022-08-28 DIAGNOSIS — G4700 Insomnia, unspecified: Secondary | ICD-10-CM | POA: Diagnosis not present

## 2022-08-28 DIAGNOSIS — Z789 Other specified health status: Secondary | ICD-10-CM | POA: Diagnosis not present

## 2022-08-28 DIAGNOSIS — F419 Anxiety disorder, unspecified: Secondary | ICD-10-CM | POA: Diagnosis not present

## 2022-08-28 DIAGNOSIS — F4323 Adjustment disorder with mixed anxiety and depressed mood: Secondary | ICD-10-CM | POA: Diagnosis not present

## 2022-08-28 MED ORDER — AZSTARYS 26.1-5.2 MG PO CAPS
1.0000 | ORAL_CAPSULE | Freq: Every morning | ORAL | 0 refills | Status: DC
  Filled 2022-08-28: qty 30, 30d supply, fill #0

## 2022-08-28 MED ORDER — PROPRANOLOL HCL ER 60 MG PO CP24
60.0000 mg | ORAL_CAPSULE | Freq: Every morning | ORAL | 1 refills | Status: DC
  Filled 2022-08-28 – 2022-11-10 (×2): qty 30, 30d supply, fill #0
  Filled 2022-12-09 (×2): qty 30, 30d supply, fill #1

## 2022-08-28 NOTE — Progress Notes (Signed)
                Dorothy Wood Dorothy Aryahna Spagna, LMFT 

## 2022-08-28 NOTE — Progress Notes (Signed)
[facility] Counselor/Therapist Progress Note  Patient ID: Dorothy Wood, MRN: [id number],    Date: 08/28/2022  Time Spent: 43 min Caregility video; Pt is home in private & Provider is in Home Office   Treatment Type: Individual Therapy  Reported Symptoms: Inc in anx/dep due to Husb's prognosis of 1-3 wks terminality  Mental Status Exam: Appearance:  Casual     Behavior: Appropriate and Sharing  Motor: Normal  Speech/Language:  Clear and Coherent and Normal Rate  Affect: Appropriate  Mood: normal  Thought process: normal  Thought content:   WNL  Sensory/Perceptual disturbances:   WNL  Orientation: oriented to person, place, and time/date  Attention: Good  Concentration: Good  Memory: WNL  Fund of knowledge:  Good  Insight:   Good  Judgment:  Good  Impulse Control: Good   Risk Assessment: Danger to Self:  No Self-injurious Behavior: No Danger to Others: No Duty to Warn:no Physical Aggression / Violence:No  Access to Firearms a concern: No  Gang Involvement:No   Subjective: Pt is home w/her Husb today caring for him in the final stages of pancreatic cancer. She is conflicted whether he wants to die in the home or move to [facility] under Hospice care. Her Dtrs want Dad to be cared for by Hospice. Pt is unsure what wishes her Husb holds for his final days.   Pt is worried she cannot accept the prognosis & is being a caregiver treating her Husb like a Pt. She wants to be the Wife now. Pt is concerned for these issues & the impact Husb's decisions for care will have on his children.  Interventions:  SFBT  Diagnosis:Adjustment disorder with anxious mood  Problems influencing health status  Plan: Dorothy Wood is in the midst of caring for a terminally ill Husb who is near death. She wants to provide love, care, & attn to his needs as his time becomes closer. Her Dtr in [place] will be visiting next week & her Dtr who lives locally will also be nearby for the  [place]. She expressed her concerns about the dying process & what her children have shared about Dad's care. Dorothy Wood will focus on her Husb & try to respect her children while also placing a priority on her Husb's decisions. We will speak weekly during these difficult weeks ahead.   Target Date: 09/03/2022 & 09/10/2022  Progress: 4  Frequency: Weekly  Modality: Dorothy [name], LMFT

## 2022-08-31 ENCOUNTER — Other Ambulatory Visit (HOSPITAL_COMMUNITY): Payer: Self-pay

## 2022-09-02 ENCOUNTER — Other Ambulatory Visit (HOSPITAL_COMMUNITY): Payer: Self-pay

## 2022-09-03 ENCOUNTER — Ambulatory Visit (INDEPENDENT_AMBULATORY_CARE_PROVIDER_SITE_OTHER): Payer: 59 | Admitting: Behavioral Health

## 2022-09-03 ENCOUNTER — Other Ambulatory Visit: Payer: Self-pay

## 2022-09-03 DIAGNOSIS — Z789 Other specified health status: Secondary | ICD-10-CM | POA: Diagnosis not present

## 2022-09-03 DIAGNOSIS — F4323 Adjustment disorder with mixed anxiety and depressed mood: Secondary | ICD-10-CM

## 2022-09-03 DIAGNOSIS — F4321 Adjustment disorder with depressed mood: Secondary | ICD-10-CM

## 2022-09-03 DIAGNOSIS — F4322 Adjustment disorder with anxiety: Secondary | ICD-10-CM

## 2022-09-03 NOTE — Progress Notes (Signed)
[facility] Counselor/Therapist Progress Note  Patient ID: Dorothy Wood, MRN: [id number],    Date: 09/03/2022  Time Spent: 78 min Caregility video; Pt is home in private & Provider is working remote in [employer]   Treatment Type: Individual Therapy  Reported Symptoms: Elevated anx/dep due to Husb's terminal condition & caring for him in the home  Mental Status Exam: Appearance:  Casual     Behavior: Appropriate and Sharing  Motor: Normal  Speech/Language:  Clear and Coherent  Affect: Appropriate  Mood: normal  Thought process: normal  Thought content:   WNL  Sensory/Perceptual disturbances:   WNL  Orientation: oriented to person, place, and time/date  Attention: Good  Concentration: Good  Memory: WNL  Fund of knowledge:  Good  Insight:   Good  Judgment:  Good  Impulse Control: Good   Risk Assessment: Danger to Self:  No Self-injurious Behavior: No Danger to Others: No Duty to Warn:no Physical Aggression / Violence:No  Access to Firearms a concern: No  Gang Involvement:No   Subjective: Dorothy Wood is honoring her Husb's wishes & caring for him in the home. He hopes to see all his friends on Fri @ the Restaurant they have gone to for years in [place].   Husb has decided to transfer to [facility] w/Hospice. Pt is waiting for their visit on Friday to assess & observe for his needs. Husb is trying to make it through Christmas.  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 It is a journey she is traveling w/her own Spouse. They will also be completing HCPOA ppw on Friday. Dorothy Wood will stay in the moment & make them all count. She will try to make memories w/his Dtrs this wknd.  Target Date: Next week  Progress: 7  Frequency: Weekly  Modality:  Dorothy [name], LMFT

## 2022-09-03 NOTE — Progress Notes (Signed)
                Jayvien Rowlette L Albie Bazin, LMFT 

## 2022-09-08 ENCOUNTER — Other Ambulatory Visit (HOSPITAL_COMMUNITY): Payer: Self-pay

## 2022-09-10 ENCOUNTER — Ambulatory Visit (INDEPENDENT_AMBULATORY_CARE_PROVIDER_SITE_OTHER): Payer: 59 | Admitting: Behavioral Health

## 2022-09-10 DIAGNOSIS — F4321 Adjustment disorder with depressed mood: Secondary | ICD-10-CM

## 2022-09-10 DIAGNOSIS — F4322 Adjustment disorder with anxiety: Secondary | ICD-10-CM | POA: Diagnosis not present

## 2022-09-10 NOTE — Progress Notes (Signed)
[facility] Counselor/Therapist Progress Note  Patient ID: Dorothy Wood, MRN: [id number],    Date: 09/10/2022  Time Spent: 31 min Caregility video; Pt is @ her home in private & Provider working remote from [employer]   Treatment Type: Individual Therapy  Reported Symptoms: Elevated anx/dep due to Husb's terminal condition & caregiving w/the assistance of Hospice Staff  Mental Status Exam: Appearance:  Casual     Behavior: Appropriate and Sharing  Motor: Normal  Speech/Language:  Clear and Coherent  Affect: Appropriate  Mood: normal  Thought process: normal  Thought content:   WNL  Sensory/Perceptual disturbances:   WNL  Orientation: oriented to person, place, and time/date  Attention: Good  Concentration: Good  Memory: WNL  Fund of knowledge:  Good  Insight:   Good  Judgment:  Good  Impulse Control: Good   Risk Assessment: Danger to Self:  No Self-injurious Behavior: No Danger to Others: No Duty to Warn:no Physical Aggression / Violence:No  Access to Firearms a concern: No  Gang Involvement:No   Subjective: Pt is trying to deal w/her Husb's needs for end-of-life & trying to manage Family w/their own needs to control the situation. This has been hard on Pt.   Pt feels close to her younger Dtr who is local & married Dtr w/Baby & Husb who disagree w/her about meals & finances. Older Dtr is giving Mom instructions about everything.   Interventions: Grief Therapy  Diagnosis:Adjustment disorder with anxious mood  Grief reaction  Plan: [place] is worried for end-of-life issues w/her Husb & Dtrs who want to be w/him @ the time of death. Husb has tried to wrap things up & care for loose ends. The [employer] went well w/his friends. [place] has been trying to deal w/her Family & the friends he holds close.  Target Date: 11/11/2022  Progress: 5  Frequency: Twice monthly  Modality: [name], LMFT

## 2022-09-10 NOTE — Progress Notes (Signed)
                Katharina Jehle L Jahleel Stroschein, LMFT 

## 2022-09-15 ENCOUNTER — Other Ambulatory Visit: Payer: Self-pay

## 2022-09-15 ENCOUNTER — Other Ambulatory Visit (HOSPITAL_COMMUNITY): Payer: Self-pay

## 2022-09-15 DIAGNOSIS — K3184 Gastroparesis: Secondary | ICD-10-CM | POA: Diagnosis not present

## 2022-09-15 DIAGNOSIS — K2100 Gastro-esophageal reflux disease with esophagitis, without bleeding: Secondary | ICD-10-CM | POA: Diagnosis not present

## 2022-09-15 DIAGNOSIS — Z86010 Personal history of colonic polyps: Secondary | ICD-10-CM | POA: Diagnosis not present

## 2022-09-15 MED ORDER — DEXMETHYLPHENIDATE HCL ER 25 MG PO CP24
1.0000 | ORAL_CAPSULE | Freq: Every morning | ORAL | 0 refills | Status: DC
  Filled 2022-09-15: qty 30, 30d supply, fill #0

## 2022-09-16 ENCOUNTER — Other Ambulatory Visit (HOSPITAL_COMMUNITY): Payer: Self-pay

## 2022-09-16 ENCOUNTER — Other Ambulatory Visit: Payer: Self-pay

## 2022-09-17 ENCOUNTER — Encounter: Payer: Self-pay | Admitting: Family Medicine

## 2022-09-17 ENCOUNTER — Encounter: Payer: Self-pay | Admitting: Orthopaedic Surgery

## 2022-09-18 ENCOUNTER — Telehealth: Payer: Commercial Managed Care - PPO | Admitting: Nurse Practitioner

## 2022-09-18 ENCOUNTER — Encounter: Payer: Self-pay | Admitting: Nurse Practitioner

## 2022-09-18 ENCOUNTER — Other Ambulatory Visit (HOSPITAL_COMMUNITY): Payer: Self-pay

## 2022-09-18 DIAGNOSIS — M62830 Muscle spasm of back: Secondary | ICD-10-CM | POA: Diagnosis not present

## 2022-09-18 MED ORDER — CYCLOBENZAPRINE HCL 10 MG PO TABS
5.0000 mg | ORAL_TABLET | Freq: Three times a day (TID) | ORAL | 2 refills | Status: DC | PRN
  Filled 2022-09-18 (×2): qty 60, 20d supply, fill #0

## 2022-09-18 NOTE — Patient Instructions (Addendum)
I have sent in flexeril for you to use. Try 1/2 tab first as this can make you very sleepy.  You can also use ibuprofen (600-'800mg'$  every 8 hours) as needed and at the same time as flexeril to see if this helps.   Keep using heat on the back, if this helps. You may also try ice to see if this helps with inflammation that may be present.   I have attached some exercises for you to try. Don't push this too hard or too fast, but gently ease into these.   If you are not improving over the next several days, please let us know.

## 2022-09-18 NOTE — Assessment & Plan Note (Addendum)
Symptoms and presentation consistent with lumbar strain resulting in intermittent spasms of the lumbar spinal muscles. I recommend she continue to use heat and will send in flexeril to help reduce the spasms and aid in pain relief. I also recommend ibuprofen 600-'800mg'$  up to every 8 hours as needed to help reduce inflammation. Lumbar exercises provided on AVS for her to start slowly.We will hold off on steroids at this time as she does not tolerate this well.  If there is no improvement of her symptoms or if her symptoms change, she will let us know. Symptoms may take up to 2 weeks to resolve completely, but should progressively improve with time and stretching.

## 2022-09-18 NOTE — Progress Notes (Signed)
Virtual Visit Encounter mychart visit.   I connected with  Dorothy Wood on 09/18/22 at  9:15 AM EST by secure video and audio telemedicine application. I verified that I am speaking with the correct person using two identifiers.   I introduced myself as a Designer, jewellery with the practice. The limitations of evaluation and management by telemedicine discussed with the patient and the availability of in person appointments. The patient expressed verbal understanding and consent to proceed.  Participating parties in this visit include: Myself and patient  The patient is: Patient Location: Home I am: Provider Location: Office/Clinic Subjective:    CC and HPI: Dorothy Wood is a 62 y.o. year old female presenting for new evaluation and treatment of back (lumbar) spasms. Patient reports the following: Dorothy Wood tells me she is currently caring for her husband who is home with hospice for pancreatic cancer. Last week she had to help get him up off of the floor and was alone. She feels she strained a muscle while lifting as since that time she has had progressive muscle spasms in the lumbar spine intermittently. She tells me that the pain is localized to the mid lumbar region without radiation. She has no saddle symptoms present. She has been using heat, which helps. The worst symptom is the spasms, which can be intense and take her breath, but only last a short period at a time.    Past medical history, Surgical history, Family history not pertinant except as noted below, Social history, Allergies, and medications have been entered into the medical record, reviewed, and corrections made.   Review of Systems:  All review of systems negative except what is listed in the HPI  Objective:    Alert and oriented x 4 Speaking in clear sentences with no shortness of breath. No distress.  Impression and Recommendations:    Problem List Items Addressed This Visit     Spasm of muscle of lower  back - Primary    Symptoms and presentation consistent with lumbar strain resulting in intermittent spasms of the lumbar spinal muscles. I recommend she continue to use heat and will send in flexeril to help reduce the spasms and aid in pain relief. I also recommend ibuprofen 600-'800mg'$  up to every 8 hours as needed to help reduce inflammation. Lumbar exercises provided on AVS for her to start slowly.We will hold off on steroids at this time as she does not tolerate this well.  If there is no improvement of her symptoms or if her symptoms change, she will let us know. Symptoms may take up to 2 weeks to resolve completely, but should progressively improve with time and stretching.       Relevant Medications   cyclobenzaprine (FLEXERIL) 10 MG tablet    orders and follow up as documented in EMR I discussed the assessment and treatment plan with the patient. The patient was provided an opportunity to ask questions and all were answered. The patient agreed with the plan and demonstrated an understanding of the instructions.   The patient was advised to call back or seek an in-person evaluation if the symptoms worsen or if the condition fails to improve as anticipated.  Follow-Up: prn  I provided 18 minutes of non-face-to-face interaction with this non face-to-face encounter including intake, same-day documentation, and chart review.   Orma Render, NP , DNP, AGNP-c [facility]

## 2022-09-21 ENCOUNTER — Other Ambulatory Visit (HOSPITAL_BASED_OUTPATIENT_CLINIC_OR_DEPARTMENT_OTHER): Payer: Self-pay

## 2022-09-21 ENCOUNTER — Other Ambulatory Visit (HOSPITAL_COMMUNITY): Payer: Self-pay

## 2022-09-23 ENCOUNTER — Encounter: Payer: Self-pay | Admitting: Internal Medicine

## 2022-09-23 NOTE — Progress Notes (Unsigned)
                Dorothy Wood [name], LMFT 

## 2022-09-24 ENCOUNTER — Encounter: Payer: Self-pay | Admitting: Orthopaedic Surgery

## 2022-09-24 ENCOUNTER — Ambulatory Visit (INDEPENDENT_AMBULATORY_CARE_PROVIDER_SITE_OTHER): Payer: Commercial Managed Care - PPO | Admitting: Orthopaedic Surgery

## 2022-09-24 ENCOUNTER — Ambulatory Visit: Payer: Commercial Managed Care - PPO | Admitting: Behavioral Health

## 2022-09-24 DIAGNOSIS — F4323 Adjustment disorder with mixed anxiety and depressed mood: Secondary | ICD-10-CM

## 2022-09-24 DIAGNOSIS — F4321 Adjustment disorder with depressed mood: Secondary | ICD-10-CM

## 2022-09-24 DIAGNOSIS — M65342 Trigger finger, left ring finger: Secondary | ICD-10-CM | POA: Diagnosis not present

## 2022-09-24 DIAGNOSIS — M65341 Trigger finger, right ring finger: Secondary | ICD-10-CM

## 2022-09-24 DIAGNOSIS — F4322 Adjustment disorder with anxiety: Secondary | ICD-10-CM

## 2022-09-24 MED ORDER — METHYLPREDNISOLONE ACETATE 40 MG/ML IJ SUSP
40.0000 mg | INTRAMUSCULAR | Status: AC | PRN
  Administered 2022-09-24: 40 mg

## 2022-09-24 MED ORDER — LIDOCAINE HCL 1 % IJ SOLN
0.5000 mL | INTRAMUSCULAR | Status: AC | PRN
  Administered 2022-09-24: .5 mL

## 2022-09-24 NOTE — Progress Notes (Signed)
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 She has the support of Family & friends, but benefits from the support of Therapy.  Mental Status Exam: Appearance:  Casual     Behavior: Appropriate and Sharing  Motor: Normal  Speech/Language:  Clear and Coherent and Normal Rate  Affect: Appropriate  Mood: sad  Thought process: normal  Thought content:   WNL  Sensory/Perceptual disturbances:   WNL  Orientation: oriented to person, place, and time/date  Attention: Good  Concentration: Good  Memory: WNL  Fund of knowledge:  Good  Insight:   Good  Judgment:  Good  Impulse Control: Good   Risk Assessment: Danger to Self:  No Self-injurious Behavior: No Danger to Others: No Duty to Warn:no Physical Aggression / Violence:No  Access to Firearms a concern: No  Gang Involvement:No   Subjective: Pt is managing the end-of-life issues related to her Husb's health status w/pancreatic cancer. There is a plan for a car show via his close friends this Sunday.    Interventions: Grief Therapy and Family Systems  Diagnosis:Adjustment disorder with anxious mood  Grief reaction  Plan: Dorothy Wood is managing her mental health needs well. She wants her Husb to live through this wknd's plans for a Car Show in her backyard. She wants him to enjoy this effort by his friends. She remains open to her Husb's own plans for his death.   Target Date: 2022-10-02  Progress: 7  Frequency: Weekly  Modality: Dorothy Wood is trying to open up the opportunity for her & Husb to have sacred conversations in these last few days-weeks. She is trying to trust things are happening  the way they are supposed to happen for her Husb.  Target Date: Oct 02, 2022  Progress: 7  Frequency: Weekly  Modality: Dorothy [name], LMFT

## 2022-09-24 NOTE — Progress Notes (Signed)
The patient has bilateral triggering of her ring fingers.  She has had injections at least once and each A1 pulley.  She is a Marine scientist.  This is been bothering her again in terms of triggering of both sides.  She does have pain over the A1 pulley of both ring fingers with active triggering.  I did successfully place a steroid injection in both hands over the A1 pulley.  If she has recurrence, the next recommendation will be surgery.  She is going to continue Voltaren gel as well.  Follow-up is as needed.  She does know to call us if it does not work or comes back.     Procedure Note  Patient: Dorothy Wood             Date of Birth: September 05, 1961           MRN: [id number]             Visit Date: 09/24/2022  Procedures: Visit Diagnoses:  1. Trigger finger, right ring finger   2. Trigger finger, left ring finger     Hand/UE Inj: R ring A1 for trigger finger on 09/24/2022 1:42 PM Medications: 0.5 mL lidocaine 1 %; 40 mg methylPREDNISolone acetate 40 MG/ML   Hand/UE Inj: L ring A1 for trigger finger on 09/24/2022 1:43 PM Medications: 0.5 mL lidocaine 1 %; 40 mg methylPREDNISolone acetate 40 MG/ML

## 2022-09-29 ENCOUNTER — Other Ambulatory Visit (HOSPITAL_COMMUNITY): Payer: Self-pay

## 2022-10-01 ENCOUNTER — Ambulatory Visit (INDEPENDENT_AMBULATORY_CARE_PROVIDER_SITE_OTHER): Payer: Commercial Managed Care - PPO | Admitting: Behavioral Health

## 2022-10-01 ENCOUNTER — Other Ambulatory Visit: Payer: Self-pay

## 2022-10-01 ENCOUNTER — Other Ambulatory Visit (HOSPITAL_COMMUNITY): Payer: Self-pay

## 2022-10-01 DIAGNOSIS — F4321 Adjustment disorder with depressed mood: Secondary | ICD-10-CM

## 2022-10-01 DIAGNOSIS — F4322 Adjustment disorder with anxiety: Secondary | ICD-10-CM

## 2022-10-01 NOTE — Progress Notes (Signed)
[facility] Counselor/Therapist Progress Note  Patient ID: Dorothy Wood, MRN: [id number],    Date: 10/01/2022  Time Spent: 1 min Caregility video; Pt is home in private & Provider is @ [facility]   Treatment Type: Individual Therapy  Reported Symptoms: Elevated anx/dep & stress due to end-of-life Caregiving for Husb in terminal stages of pancreatic cancer  Mental Status Exam: Appearance:  Casual     Behavior: Appropriate and Sharing  Motor: Normal  Speech/Language:  Clear and Coherent and Normal Rate  Affect: Appropriate  Mood: normal  Thought process: normal  Thought content:   WNL  Sensory/Perceptual disturbances:   WNL  Orientation: oriented to person, place, and time/date  Attention: Good  Concentration: Good  Memory: WNL  Fund of knowledge:  Good  Insight:   Good  Judgment:  Good  Impulse Control: Good   Risk Assessment: Danger to Self:  No Self-injurious Behavior: No Danger to Others: No Duty to Warn:no Physical Aggression / Violence:No  Access to Firearms a concern: No  Gang Involvement:No   Subjective: Pt is on new XR Methylphenidate & it is helping her w/the Caregiving duties she does for her Husb. She is on a 24/7 schedule & she feels more rested taking the new medication. Pt has been amazed how hardy her Husb has been in the past few wks & reports that Hospice is also amazed. Husb is giving things away & also discussing the future-this is confusing, but she knows his process is unique to him.  Pt is also working on finances.  Interventions: Grief Therapy  Diagnosis:Adjustment disorder with anxious mood  Grief reaction  Plan: Dorothy Wood will cont to attend her Caregility sessions & reach out prn for support.  Target Date: 11/01/2022  Progress: 7  Frequency: Twice monthly  Modality: [name], LMFT

## 2022-10-01 NOTE — Progress Notes (Signed)
                Jadyn Barge L Cherylann Hobday, LMFT 

## 2022-10-08 ENCOUNTER — Ambulatory Visit: Payer: Commercial Managed Care - PPO | Admitting: Behavioral Health

## 2022-10-08 NOTE — Progress Notes (Unsigned)
                Dorothy Wood L [name], LMFT 

## 2022-10-12 ENCOUNTER — Encounter: Payer: Self-pay | Admitting: Family Medicine

## 2022-10-22 ENCOUNTER — Other Ambulatory Visit (HOSPITAL_COMMUNITY): Payer: Self-pay

## 2022-10-23 ENCOUNTER — Other Ambulatory Visit (HOSPITAL_COMMUNITY): Payer: Self-pay

## 2022-11-02 ENCOUNTER — Other Ambulatory Visit: Payer: Self-pay

## 2022-11-02 ENCOUNTER — Other Ambulatory Visit (HOSPITAL_COMMUNITY): Payer: Self-pay

## 2022-11-02 ENCOUNTER — Other Ambulatory Visit: Payer: Self-pay | Admitting: Neurology

## 2022-11-02 ENCOUNTER — Other Ambulatory Visit: Payer: Self-pay | Admitting: Family Medicine

## 2022-11-03 ENCOUNTER — Other Ambulatory Visit (HOSPITAL_COMMUNITY): Payer: Self-pay

## 2022-11-03 ENCOUNTER — Other Ambulatory Visit: Payer: Self-pay

## 2022-11-03 MED ORDER — ATORVASTATIN CALCIUM 80 MG PO TABS
80.0000 mg | ORAL_TABLET | Freq: Every day | ORAL | 1 refills | Status: DC
  Filled 2022-11-03: qty 90, 90d supply, fill #0
  Filled 2023-02-08: qty 90, 90d supply, fill #1

## 2022-11-04 ENCOUNTER — Ambulatory Visit (INDEPENDENT_AMBULATORY_CARE_PROVIDER_SITE_OTHER): Payer: Commercial Managed Care - PPO | Admitting: Behavioral Health

## 2022-11-04 ENCOUNTER — Other Ambulatory Visit (HOSPITAL_COMMUNITY): Payer: Self-pay

## 2022-11-04 DIAGNOSIS — F4321 Adjustment disorder with depressed mood: Secondary | ICD-10-CM | POA: Diagnosis not present

## 2022-11-04 DIAGNOSIS — F4322 Adjustment disorder with anxiety: Secondary | ICD-10-CM

## 2022-11-04 NOTE — Progress Notes (Signed)
                Jillienne Egner L Cela Newcom, LMFT 

## 2022-11-04 NOTE — Progress Notes (Signed)
[facility] Counselor/Therapist Progress Note  Patient ID: Dorothy Wood, MRN: [id number],    Date: 11/04/2022  Time Spent: 32 min In Person @ [facility]   Treatment Type: Individual Therapy  Reported Symptoms: Pt is positive today & also in disbelief over the recent death of her Husb Dorothy Wood while in [place] in [place]. Pt is concerned if her rxn is normal.  Mental Status Exam: Appearance:  Casual     Behavior: Appropriate and Sharing  Motor: Restlestness  Speech/Language:  Clear and Coherent  Affect: Appropriate  Mood: anxious  Thought process: normal  Thought content:   WNL  Sensory/Perceptual disturbances:   WNL  Orientation: oriented to person, place, and time/date  Attention: Good  Concentration: Good  Memory: WNL  Fund of knowledge:  Good  Insight:   Good  Judgment:  Good  Impulse Control: Good   Risk Assessment: Danger to Self:  No Self-injurious Behavior: No Danger to Others: No Duty to Warn:no Physical Aggression / Violence:No  Access to Firearms a concern: No  Gang Involvement:No   Subjective: Pt is content to be present in session physically. She has been homebound since Dec 2023 caring for Husb who has been dying. Today, Pt is recounting all the practical things she is doing & her concern for her Dtrs arguing. She is trying to surmount a heavy discussion w/them & wants to avoid conflict.   Interventions: Grief Therapy and Family Systems  Diagnosis:Grief reaction  Adjustment disorder with anxious mood  Plan: Malaun is concerned she is not grieving correctly. Reassured Pt she is doing only what she needs to do & this is her process. Pay attn to your unique process & realize no 2 exp's are alike. Pt is travelling over the wknd to see a Str of her late Husb in [place]. She is open to this trip exp & wants to antique w/her Dtr. She will let the wknd unfold & take in every exp.  Target Date: 12/02/2022  Progress: 4  Frequency: Twice  monthly after upcoming travels as Pt indicates  Modality: [name], LMFT

## 2022-11-09 ENCOUNTER — Encounter: Payer: Self-pay | Admitting: Family Medicine

## 2022-11-10 ENCOUNTER — Other Ambulatory Visit: Payer: Self-pay

## 2022-11-10 ENCOUNTER — Encounter (HOSPITAL_COMMUNITY): Payer: Self-pay

## 2022-11-10 ENCOUNTER — Other Ambulatory Visit (HOSPITAL_COMMUNITY): Payer: Self-pay

## 2022-11-10 NOTE — Telephone Encounter (Signed)
It looks like an order was placed for genetic counseling on 08/25/2022. Our referral coordinator Clois Comber says that she doesn't see a referral for this. A referral order needs to be placed.  I called patient to ask what the genetic counseling was for per Dr. Lanice Shirts request. Patient says that her husband had 5 different types of cancer and died. Her daughters had genetic testing done, and their provider recommended that she have it done also. Patient is going to speak with her daughters to see where they had the testing done at. Once she finds out, she will let us know so that we can send over a referral or order the testing.

## 2022-11-12 DIAGNOSIS — F330 Major depressive disorder, recurrent, mild: Secondary | ICD-10-CM | POA: Diagnosis not present

## 2022-11-12 DIAGNOSIS — F4325 Adjustment disorder with mixed disturbance of emotions and conduct: Secondary | ICD-10-CM | POA: Diagnosis not present

## 2022-11-12 DIAGNOSIS — F419 Anxiety disorder, unspecified: Secondary | ICD-10-CM | POA: Diagnosis not present

## 2022-11-13 ENCOUNTER — Other Ambulatory Visit (HOSPITAL_COMMUNITY): Payer: Self-pay

## 2022-11-13 ENCOUNTER — Other Ambulatory Visit: Payer: Self-pay

## 2022-11-13 MED ORDER — SERTRALINE HCL 100 MG PO TABS
100.0000 mg | ORAL_TABLET | Freq: Every day | ORAL | 1 refills | Status: DC
  Filled 2022-11-13: qty 90, 90d supply, fill #0
  Filled 2023-02-22 (×2): qty 90, 90d supply, fill #1

## 2022-11-13 MED ORDER — BUPROPION HCL ER (XL) 150 MG PO TB24
150.0000 mg | ORAL_TABLET | Freq: Every morning | ORAL | 1 refills | Status: DC
  Filled 2022-11-13 – 2023-01-19 (×2): qty 90, 90d supply, fill #0

## 2022-11-13 MED ORDER — LORAZEPAM 1 MG PO TABS
1.0000 mg | ORAL_TABLET | Freq: Every day | ORAL | 2 refills | Status: DC
  Filled 2022-11-13: qty 30, 30d supply, fill #0
  Filled 2022-12-13: qty 30, 30d supply, fill #1
  Filled 2023-01-13: qty 30, 30d supply, fill #2
  Filled 2023-01-14: qty 2, 2d supply, fill #2
  Filled 2023-01-14: qty 28, 28d supply, fill #2

## 2022-11-14 ENCOUNTER — Other Ambulatory Visit (HOSPITAL_COMMUNITY): Payer: Self-pay

## 2022-11-14 MED ORDER — COTEMPLA XR-ODT 17.3 MG PO TBED
17.3000 mg | EXTENDED_RELEASE_TABLET | ORAL | 0 refills | Status: DC
  Filled 2022-11-14 – 2022-11-30 (×3): qty 30, 30d supply, fill #0

## 2022-11-16 ENCOUNTER — Other Ambulatory Visit: Payer: Self-pay | Admitting: Obstetrics & Gynecology

## 2022-11-16 ENCOUNTER — Other Ambulatory Visit (HOSPITAL_COMMUNITY): Payer: Self-pay

## 2022-11-16 DIAGNOSIS — E2839 Other primary ovarian failure: Secondary | ICD-10-CM

## 2022-11-16 MED ORDER — PEG 3350-KCL-NA BICARB-NACL 420 G PO SOLR
ORAL | 0 refills | Status: DC
  Filled 2022-11-16: qty 4000, 1d supply, fill #0

## 2022-11-18 ENCOUNTER — Other Ambulatory Visit (HOSPITAL_COMMUNITY): Payer: Self-pay

## 2022-11-18 ENCOUNTER — Encounter: Payer: Self-pay | Admitting: Neurology

## 2022-11-19 ENCOUNTER — Other Ambulatory Visit: Payer: Self-pay

## 2022-11-19 DIAGNOSIS — D369 Benign neoplasm, unspecified site: Secondary | ICD-10-CM | POA: Insufficient documentation

## 2022-11-19 DIAGNOSIS — Z86010 Personal history of colonic polyps: Secondary | ICD-10-CM | POA: Diagnosis not present

## 2022-11-19 DIAGNOSIS — K5730 Diverticulosis of large intestine without perforation or abscess without bleeding: Secondary | ICD-10-CM | POA: Diagnosis not present

## 2022-11-19 DIAGNOSIS — Z09 Encounter for follow-up examination after completed treatment for conditions other than malignant neoplasm: Secondary | ICD-10-CM | POA: Diagnosis not present

## 2022-11-19 DIAGNOSIS — D122 Benign neoplasm of ascending colon: Secondary | ICD-10-CM | POA: Diagnosis not present

## 2022-11-19 LAB — HM COLONOSCOPY

## 2022-11-20 ENCOUNTER — Encounter: Payer: Self-pay | Admitting: Adult Health

## 2022-11-20 ENCOUNTER — Ambulatory Visit
Admission: RE | Admit: 2022-11-20 | Discharge: 2022-11-20 | Disposition: A | Discharge status: Home / Self Care | Payer: Commercial Managed Care - PPO | Source: Ambulatory Visit | Attending: Obstetrics & Gynecology | Admitting: Obstetrics & Gynecology

## 2022-11-20 DIAGNOSIS — Z780 Asymptomatic menopausal state: Secondary | ICD-10-CM | POA: Diagnosis not present

## 2022-11-20 DIAGNOSIS — E2839 Other primary ovarian failure: Secondary | ICD-10-CM

## 2022-11-20 LAB — HM DEXA SCAN: HM Dexa Scan: NORMAL

## 2022-11-30 ENCOUNTER — Encounter: Payer: Self-pay | Admitting: Family Medicine

## 2022-11-30 ENCOUNTER — Other Ambulatory Visit (HOSPITAL_COMMUNITY): Payer: Self-pay

## 2022-12-01 ENCOUNTER — Other Ambulatory Visit: Payer: Self-pay

## 2022-12-01 ENCOUNTER — Other Ambulatory Visit (HOSPITAL_COMMUNITY): Payer: Self-pay

## 2022-12-01 ENCOUNTER — Ambulatory Visit: Payer: Commercial Managed Care - PPO | Admitting: Medical

## 2022-12-01 VITALS — BP 122/80 | HR 65 | Wt 132.0 lb

## 2022-12-01 DIAGNOSIS — S8002XA Contusion of left knee, initial encounter: Secondary | ICD-10-CM | POA: Diagnosis not present

## 2022-12-01 DIAGNOSIS — Z23 Encounter for immunization: Secondary | ICD-10-CM

## 2022-12-01 DIAGNOSIS — M25562 Pain in left knee: Secondary | ICD-10-CM

## 2022-12-01 NOTE — Progress Notes (Signed)
Subjective: Chief Complaint  Patient presents with   fell and hurt knee    [address] and hurt knee yesterday morning, hurts to bend knee    Here for hurt knee. Wilburn Mylar was in [place] when she was walking on a slanted board yesterday, and slid.  Missed the grips to prevent slipping.  Right knee extended out but she landed on left knee on the ground.  Was able to get up and ambulate.  Started hurting right then.  Hurts on outside and feels somewhat swollen.   Moving helps, sitting still makes it worse.  Using nothing for it, has not iced.   No giving out.   No other aggravating or relieving factors. No other complaint.  ROS as in subjective    Objective: BP 122/80   Pulse 65   Wt 132 lb (59.9 kg)   BMI 21.97 kg/m   Gen: wd, wn, nad Left anterior leg just below the knee with a small 1 cm diameter abrasion, she is tender over the inferior patella and patellar tendon and in the inferior region below the patella with slight faint coloration suggesting some early bruising, otherwise no swelling or bruising No laxity of the knee joint, no obvious swelling, no pain with McMurray's test, no posterior pain, rest of leg unremarkable Legs neurovascularly intact   Assessment: Encounter Diagnoses  Name Primary?   Acute pain of left knee Yes   Need for shingles vaccine    Contusion of left knee, initial encounter      Plan: We discussed symptoms and concerns.  Fortunately looks to be more of a knee contusion and not worse injury.  We discussed the following recommendations  Patient Instructions  Recommendations: You have a knee contusion/bruising, no obvious fracture or tear based on exam and symptoms I recommend ice water therapy, 15-20 minutes at a time along with elevating leg and rest.  Do this for the next 3-4 days Consider using a knee sleeve or ACE wrap for the next week. This provide compression Consider using Aleve over the counter 1-2 tablets up to twice daily for the next week for  pain and inflammation Be active since that helps but avoid excess activity for a few days When seated, try and elevated leg up on pillow the next few days while ice therapy  If worse in the next week or swelling or giving out sensation, then recheck    Counseled on the Shingrix vaccine.  Vaccine information sheet given.    Krystn was seen today for fell and hurt knee.  Diagnoses and all orders for this visit:  Acute pain of left knee  Need for shingles vaccine -     Varicella-zoster vaccine IM  Contusion of left knee, initial encounter   F/u prn

## 2022-12-01 NOTE — Patient Instructions (Signed)
Recommendations: You have a knee contusion/bruising, no obvious fracture or tear based on exam and symptoms I recommend ice water therapy, 15-20 minutes at a time along with elevating leg and rest.  Do this for the next 3-4 days Consider using a knee sleeve or ACE wrap for the next week. This provide compression Consider using Aleve over the counter 1-2 tablets up to twice daily for the next week for pain and inflammation Be active since that helps but avoid excess activity for a few days When seated, try and elevated leg up on pillow the next few days while ice therapy  If worse in the next week or swelling or giving out sensation, then recheck

## 2022-12-01 NOTE — Addendum Note (Signed)
Addended by: Carlena Hurl on: 12/01/2022 04:29 PM   Modules accepted: Level of Service

## 2022-12-02 ENCOUNTER — Ambulatory Visit: Payer: Commercial Managed Care - PPO | Admitting: Behavioral Health

## 2022-12-04 ENCOUNTER — Other Ambulatory Visit: Payer: Self-pay

## 2022-12-04 ENCOUNTER — Other Ambulatory Visit (HOSPITAL_COMMUNITY): Payer: Self-pay

## 2022-12-08 ENCOUNTER — Other Ambulatory Visit (HOSPITAL_COMMUNITY): Payer: Self-pay

## 2022-12-08 ENCOUNTER — Other Ambulatory Visit (HOSPITAL_BASED_OUTPATIENT_CLINIC_OR_DEPARTMENT_OTHER): Payer: Self-pay

## 2022-12-08 MED ORDER — COTEMPLA XR-ODT 17.3 MG PO TBED
17.3000 mg | EXTENDED_RELEASE_TABLET | Freq: Every morning | ORAL | 0 refills | Status: DC
  Filled 2022-12-08: qty 30, 30d supply, fill #0

## 2022-12-09 ENCOUNTER — Telehealth (INDEPENDENT_AMBULATORY_CARE_PROVIDER_SITE_OTHER): Payer: Commercial Managed Care - PPO | Admitting: Family Medicine

## 2022-12-09 ENCOUNTER — Other Ambulatory Visit: Payer: Self-pay

## 2022-12-09 ENCOUNTER — Other Ambulatory Visit (HOSPITAL_COMMUNITY): Payer: Self-pay

## 2022-12-09 ENCOUNTER — Encounter: Payer: Self-pay | Admitting: Family Medicine

## 2022-12-09 VITALS — BP 128/70 | HR 70 | Temp 98.1°F | Wt 127.0 lb

## 2022-12-09 DIAGNOSIS — F4321 Adjustment disorder with depressed mood: Secondary | ICD-10-CM

## 2022-12-09 DIAGNOSIS — G2581 Restless legs syndrome: Secondary | ICD-10-CM | POA: Diagnosis not present

## 2022-12-09 MED ORDER — PRAMIPEXOLE DIHYDROCHLORIDE 0.125 MG PO TABS
0.1250 mg | ORAL_TABLET | Freq: Every evening | ORAL | 5 refills | Status: DC | PRN
  Filled 2022-12-09: qty 30, 30d supply, fill #0
  Filled 2023-01-09: qty 90, 90d supply, fill #1
  Filled 2023-03-10: qty 60, 60d supply, fill #2

## 2022-12-09 NOTE — Progress Notes (Signed)
   Subjective:    Patient ID: Dorothy Wood, female    DOB: [birth year], 62 y.o.   MRN: [id number]  HPI Documentation for virtual audio and video telecommunications through [place] encounter: The patient was located at home. 2 patient identifiers used.  The provider was located in the office. The patient did consent to this visit and is aware of possible charges through their insurance for this visit. The other persons participating in this telemedicine service were none. Time spent on call was 5 minutes and in review of previous records >20 minutes total for counseling and coordination of care. This virtual service is not related to other E/M service within previous 7 days.  Her husband died recently after a long illness.  She has been his primary caregiver especially in the last several months when he was in the dying process.  She seems to have handled this fairly well but now she does note that she is having difficulty with word searching.  She has not had any other memory issues.  She did have a recent MRI for other issues and it was negative.  She also complains of restless legs that has been going on for several years but is never thought to mention it to me in the past.  It starts at night and even before she gets in bed can cause trouble.  Review of Systems     Objective:   Physical Exam Alert and in no distress otherwise not examined       Assessment & Plan:  Grieving  RLS (restless legs syndrome) I explained that I was not overly worried about the word searching and this could easily be you psychological based on the recent death of her husband and trying to get all the affairs in order.  We will continue to monitor this and she plans to get involved in bereavement counseling through hospice. I will place her on Mirapex.  She is to call me to let me know how she is doing on the medication and we can increase this as needed.

## 2022-12-12 ENCOUNTER — Other Ambulatory Visit (HOSPITAL_COMMUNITY): Payer: Self-pay

## 2022-12-13 ENCOUNTER — Other Ambulatory Visit (HOSPITAL_COMMUNITY): Payer: Self-pay

## 2022-12-14 ENCOUNTER — Other Ambulatory Visit: Payer: Self-pay

## 2022-12-18 ENCOUNTER — Encounter: Payer: Self-pay | Admitting: Family Medicine

## 2023-01-05 ENCOUNTER — Other Ambulatory Visit: Payer: Self-pay | Admitting: Obstetrics & Gynecology

## 2023-01-05 DIAGNOSIS — Z1231 Encounter for screening mammogram for malignant neoplasm of breast: Secondary | ICD-10-CM

## 2023-01-07 NOTE — Progress Notes (Signed)
HPI: FU hypertension and family h/o CAD.  Calcium score February 2021 0.  Brain MRI/MRA August 2021 normal.  Echocardiogram July 2022 showed normal LV function.  Carotid Dopplers August 2023 near normal bilaterally.  Since last seen, the patient denies any dyspnea on exertion, orthopnea, PND, pedal edema, palpitations, syncope or chest pain.  Recently lost her husband to pancreatic cancer and appropriately grieving.   Current Outpatient Medications  Medication Sig Dispense Refill   Methylphenidate (COTEMPLA XR-ODT) 17.3 MG TBED Take 1 tablet (17.3 mg total) by mouth every morning. 30 tablet 0   pantoprazole (PROTONIX) 40 MG tablet Take 1 tablet (40 mg total) by mouth 2 (two) times daily. 60 tablet 3   aspirin EC 81 MG tablet Take 81 mg by mouth every morning. Swallow whole.     atorvastatin (LIPITOR) 80 MG tablet Take 1 tablet (80 mg total) by mouth daily. 90 tablet 1   buPROPion (WELLBUTRIN XL) 150 MG 24 hr tablet Take 1 tablet (150 mg total) by mouth every morning. 90 tablet 1   ezetimibe (ZETIA) 10 MG tablet Take 1 tablet by mouth daily. 90 tablet 3   guanFACINE (TENEX) 2 MG tablet Take 1 tablet (2 mg total) by mouth 2 (two) times daily in the morning and at bedtime. 180 tablet 1   levothyroxine (EUTHYROX) 88 MCG tablet TAKE 1 TABLET BY MOUTH ONCE A DAY 90 tablet 3   LORazepam (ATIVAN) 1 MG tablet Take 1 tablet (1 mg total) by mouth daily. 30 tablet 2   pilocarpine (SALAGEN) 5 MG tablet TAKE 1 TABLET BY MOUTH 4 TIMES DAILY 360 tablet 1   pramipexole (MIRAPEX) 0.125 MG tablet Take 1 tablet (0.125 mg total) by mouth at bedtime as needed. 30 tablet 5   propranolol ER (INDERAL LA) 60 MG 24 hr capsule Take 1 capsule (60 mg total) by mouth in the morning. 30 capsule 1   sertraline (ZOLOFT) 100 MG tablet Take 1 tablet (100 mg total) by mouth daily. 90 tablet 1   No current facility-administered medications for this visit.     Past Medical History:  Diagnosis Date   Arthritis    Cancer  (HCC)    OVARIAN   Colonic polyp    Depression    Gastritis    Hyperlipidemia    Hypertension    Hypothyroidism    Sjogren's disease (HCC)    Stroke (HCC)    Thyroid disease    HYPOTHYROIDISM   TIA (transient ischemic attack) 04/06/2021    Past Surgical History:  Procedure Laterality Date   ABDOMINAL HYSTERECTOMY     Ovarian cancer   BIOPSY  02/04/2021   Procedure: BIOPSY;  Surgeon: Bernette Redbird, MD;  Location: [facility];  Service: Endoscopy;;   BREAST CYST ASPIRATION     CESAREAN SECTION     x2   CHOLECYSTECTOMY N/A 01/23/2022   Procedure: LAPAROSCOPIC CHOLECYSTECTOMY WITH INTRAOPERATIVE CHOLANGIOGRAM;  Surgeon: Darnell Level, MD;  Location: [facility];  Service: General;  Laterality: N/A;   COLONOSCOPY  10-06   Dr. Louie Bun   ESOPHAGOGASTRODUODENOSCOPY N/A 02/04/2021   Procedure: ESOPHAGOGASTRODUODENOSCOPY (EGD);  Surgeon: Bernette Redbird, MD;  Location: [facility];  Service: Endoscopy;  Laterality: N/A;   TONSILLECTOMY      Social History   Socioeconomic History   Marital status: Married    Spouse name: Not on file   Number of children: 2   Years of education: Not on file   Highest education level: Not on file  Occupational  History   Not on file  Tobacco Use   Smoking status: Never   Smokeless tobacco: Never  Vaping Use   Vaping Use: Never used  Substance and Sexual Activity   Alcohol use: Yes    Alcohol/week: 4.0 standard drinks of alcohol    Types: 4 Glasses of wine per week    Comment: 05/21/21 1 glasses wine 3 x week   Drug use: No   Sexual activity: Not Currently  Other Topics Concern   Not on file  Social History Narrative   Lives with spouse   Social Determinants of Health   Financial Resource Strain: Not on file  Food Insecurity: No Food Insecurity (01/27/2022)   Hunger Vital Sign    Worried About Running Out of Food in the Last Year: Never true    Ran Out of Food in the Last Year: Never true  Transportation Needs: No Transportation Needs  (01/27/2022)   PRAPARE - Administrator, Civil Service (Medical): No    Lack of Transportation (Non-Medical): No  Physical Activity: Not on file  Stress: Not on file  Social Connections: Not on file  Intimate Partner Violence: Not on file    Family History  Problem Relation Age of Onset   Mental illness Mother    Hypertension Mother    Heart failure Mother    CAD Father        CABG at age 14   Mental illness Sister    Alcoholism Sister    Mental illness Brother    Alcoholism Brother    Mental illness Maternal Uncle    Mental illness Maternal Grandmother    Cancer Paternal Grandmother     ROS: no fevers or chills, productive cough, hemoptysis, dysphasia, odynophagia, melena, hematochezia, dysuria, hematuria, rash, seizure activity, orthopnea, PND, pedal edema, claudication. Remaining systems are negative.  Physical Exam: Well-developed well-nourished in no acute distress.  Skin is warm and dry.  HEENT is normal.  Neck is supple.  Chest is clear to auscultation with normal expansion.  Cardiovascular exam is regular rate and rhythm.  Abdominal exam nontender or distended. No masses palpated. Extremities show no edema. neuro grossly intact   A/P  1 hypertension-patient's blood pressure is controlled today.  Continue present medical regimen.  2 hyperlipidemia-continue statin.  3 family history of coronary disease-patient denies chest pain.  Previous calcium score 0.  Olga Millers, MD

## 2023-01-09 ENCOUNTER — Other Ambulatory Visit (HOSPITAL_COMMUNITY): Payer: Self-pay

## 2023-01-13 ENCOUNTER — Other Ambulatory Visit (HOSPITAL_COMMUNITY): Payer: Self-pay

## 2023-01-14 ENCOUNTER — Other Ambulatory Visit (HOSPITAL_COMMUNITY): Payer: Self-pay

## 2023-01-14 ENCOUNTER — Encounter: Payer: Self-pay | Admitting: Cardiology

## 2023-01-14 ENCOUNTER — Other Ambulatory Visit: Payer: Self-pay | Admitting: Family Medicine

## 2023-01-14 ENCOUNTER — Ambulatory Visit: Payer: BC Managed Care – PPO | Attending: Cardiology | Admitting: Cardiology

## 2023-01-14 VITALS — BP 130/76 | HR 68 | Ht 65.0 in | Wt 132.0 lb

## 2023-01-14 DIAGNOSIS — I10 Essential (primary) hypertension: Secondary | ICD-10-CM

## 2023-01-14 DIAGNOSIS — E782 Mixed hyperlipidemia: Secondary | ICD-10-CM | POA: Diagnosis not present

## 2023-01-14 NOTE — Patient Instructions (Signed)
    Follow-Up: At [facility], you and your health needs are our priority.  As part of our continuing mission to provide you with exceptional heart care, we have created designated Provider Care Teams.  These Care Teams include your primary Cardiologist (physician) and Advanced Practice Providers (APPs -  Physician Assistants and Nurse Practitioners) who all work together to provide you with the care you need, when you need it.  We recommend signing up for the patient portal called "MyChart".  Sign up information is provided on this After Visit Summary.  MyChart is used to connect with patients for Virtual Visits (Telemedicine).  Patients are able to view lab/test results, encounter notes, upcoming appointments, etc.  Non-urgent messages can be sent to your provider as well.   To learn more about what you can do with MyChart, go to [email or web address].    Your next appointment:   12 month(s)  Provider:   Brian Crenshaw, MD     

## 2023-01-15 ENCOUNTER — Other Ambulatory Visit (HOSPITAL_COMMUNITY): Payer: Self-pay

## 2023-01-16 ENCOUNTER — Other Ambulatory Visit (HOSPITAL_COMMUNITY): Payer: Self-pay

## 2023-01-16 MED ORDER — PROPRANOLOL HCL ER 60 MG PO CP24
60.0000 mg | ORAL_CAPSULE | Freq: Every morning | ORAL | 1 refills | Status: DC
  Filled 2023-01-16: qty 30, 30d supply, fill #0
  Filled 2023-02-22 (×2): qty 30, 30d supply, fill #1

## 2023-01-19 ENCOUNTER — Other Ambulatory Visit (HOSPITAL_COMMUNITY): Payer: Self-pay

## 2023-01-19 ENCOUNTER — Other Ambulatory Visit: Payer: Self-pay

## 2023-01-22 ENCOUNTER — Other Ambulatory Visit (HOSPITAL_COMMUNITY): Payer: Self-pay

## 2023-01-25 ENCOUNTER — Other Ambulatory Visit (HOSPITAL_COMMUNITY): Payer: Self-pay

## 2023-01-25 MED ORDER — COTEMPLA XR-ODT 17.3 MG PO TBED
17.3000 mg | EXTENDED_RELEASE_TABLET | Freq: Every morning | ORAL | 0 refills | Status: DC
  Filled 2023-01-25: qty 30, 30d supply, fill #0

## 2023-01-28 ENCOUNTER — Other Ambulatory Visit: Payer: Self-pay

## 2023-01-28 MED ORDER — DEXMETHYLPHENIDATE HCL ER 20 MG PO CP24
ORAL_CAPSULE | ORAL | 0 refills | Status: DC
  Filled 2023-01-28: qty 30, 30d supply, fill #0

## 2023-01-29 ENCOUNTER — Other Ambulatory Visit (HOSPITAL_COMMUNITY): Payer: Self-pay

## 2023-01-29 ENCOUNTER — Other Ambulatory Visit: Payer: Self-pay

## 2023-02-01 ENCOUNTER — Ambulatory Visit
Admission: RE | Admit: 2023-02-01 | Discharge: 2023-02-01 | Disposition: A | Discharge status: Home / Self Care | Payer: BC Managed Care – PPO | Source: Ambulatory Visit | Attending: Obstetrics & Gynecology | Admitting: Obstetrics & Gynecology

## 2023-02-01 DIAGNOSIS — Z1231 Encounter for screening mammogram for malignant neoplasm of breast: Secondary | ICD-10-CM

## 2023-02-01 LAB — HM MAMMOGRAPHY

## 2023-02-03 ENCOUNTER — Ambulatory Visit: Payer: BC Managed Care – PPO | Admitting: Behavioral Health

## 2023-02-03 NOTE — Progress Notes (Unsigned)
                Denissa Cozart L Bralynn Velador, LMFT 

## 2023-02-04 ENCOUNTER — Other Ambulatory Visit (HOSPITAL_COMMUNITY): Payer: Self-pay

## 2023-02-08 ENCOUNTER — Other Ambulatory Visit (HOSPITAL_COMMUNITY): Payer: Self-pay

## 2023-02-09 ENCOUNTER — Other Ambulatory Visit (HOSPITAL_COMMUNITY): Payer: Self-pay

## 2023-02-12 ENCOUNTER — Encounter: Payer: Self-pay | Admitting: Internal Medicine

## 2023-02-12 ENCOUNTER — Other Ambulatory Visit (HOSPITAL_COMMUNITY): Payer: Self-pay

## 2023-02-15 DIAGNOSIS — H903 Sensorineural hearing loss, bilateral: Secondary | ICD-10-CM | POA: Diagnosis not present

## 2023-02-22 ENCOUNTER — Other Ambulatory Visit (HOSPITAL_COMMUNITY): Payer: Self-pay

## 2023-02-22 ENCOUNTER — Other Ambulatory Visit: Payer: Self-pay

## 2023-02-22 MED ORDER — LEVOTHYROXINE SODIUM 88 MCG PO TABS
88.0000 ug | ORAL_TABLET | Freq: Every day | ORAL | 3 refills | Status: DC
  Filled 2023-02-22: qty 90, 90d supply, fill #0
  Filled 2023-07-13: qty 90, 90d supply, fill #1
  Filled 2023-10-25: qty 90, 90d supply, fill #2
  Filled 2024-02-02: qty 90, 90d supply, fill #3

## 2023-03-02 ENCOUNTER — Other Ambulatory Visit: Payer: Self-pay | Admitting: Family Medicine

## 2023-03-02 ENCOUNTER — Other Ambulatory Visit (HOSPITAL_COMMUNITY): Payer: Self-pay

## 2023-03-02 ENCOUNTER — Other Ambulatory Visit: Payer: Self-pay

## 2023-03-02 DIAGNOSIS — M3500 Sicca syndrome, unspecified: Secondary | ICD-10-CM

## 2023-03-02 MED ORDER — DEXMETHYLPHENIDATE HCL ER 20 MG PO CP24
20.0000 mg | ORAL_CAPSULE | Freq: Every day | ORAL | 0 refills | Status: DC
  Filled 2023-03-02: qty 30, 30d supply, fill #0

## 2023-03-02 MED ORDER — PILOCARPINE HCL 5 MG PO TABS
5.0000 mg | ORAL_TABLET | Freq: Four times a day (QID) | ORAL | 1 refills | Status: DC
  Filled 2023-03-02 – 2023-03-10 (×2): qty 360, 90d supply, fill #0
  Filled 2023-08-01: qty 360, 90d supply, fill #1

## 2023-03-02 NOTE — Telephone Encounter (Signed)
Last refill: 03/26/2022

## 2023-03-03 ENCOUNTER — Other Ambulatory Visit (HOSPITAL_COMMUNITY): Payer: Self-pay

## 2023-03-03 ENCOUNTER — Other Ambulatory Visit: Payer: Self-pay

## 2023-03-04 ENCOUNTER — Other Ambulatory Visit (HOSPITAL_COMMUNITY): Payer: Self-pay

## 2023-03-08 ENCOUNTER — Encounter: Payer: Self-pay | Admitting: Sports Medicine

## 2023-03-08 ENCOUNTER — Ambulatory Visit (INDEPENDENT_AMBULATORY_CARE_PROVIDER_SITE_OTHER): Payer: BC Managed Care – PPO | Admitting: Sports Medicine

## 2023-03-08 DIAGNOSIS — M65342 Trigger finger, left ring finger: Secondary | ICD-10-CM | POA: Diagnosis not present

## 2023-03-08 DIAGNOSIS — M65341 Trigger finger, right ring finger: Secondary | ICD-10-CM

## 2023-03-08 MED ORDER — LIDOCAINE HCL 1 % IJ SOLN
1.0000 mL | INTRAMUSCULAR | Status: AC | PRN
  Administered 2023-03-08: 1 mL

## 2023-03-08 MED ORDER — BETAMETHASONE SOD PHOS & ACET 6 (3-3) MG/ML IJ SUSP
6.0000 mg | INTRAMUSCULAR | Status: AC | PRN
  Administered 2023-03-08: 6 mg via INTRA_ARTICULAR

## 2023-03-08 NOTE — Progress Notes (Signed)
Bilateral hand; ring finger trigger fingers Has had 2 injections in past for this; lasted about a month  Unsure if injections this time will help; or should she consider surgery  Right is worse than left

## 2023-03-08 NOTE — Progress Notes (Signed)
Dorothy Wood - 62 y.o. female MRN [id number]  Date of birth: 05/14/1961  Office Visit Note: Visit Date: 03/08/2023 PCP: Ronnald Nian, MD Referred by: Ronnald Nian, MD  Subjective: Chief Complaint  Patient presents with   Left Hand - Pain   Right Hand - Pain   HPI: Dorothy Wood is a pleasant 62 y.o. female who presents today for bilateral ring trigger fingers.  Had these in the past, performed by Dr. Magnus Ivan.  At the last year that worked for a few months and then recently last injected on 09/24/22 - helped for about one month. R > L. 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  This is causing her extreme pain, she is getting triggering episodes daily.  Will have to pry open the finger passively.  She is right-hand dominant.  Lab Results  Component Value Date   HGBA1C 5.6 04/07/2021   Pertinent ROS were reviewed with the patient and found to be negative unless otherwise specified above in HPI.   Assessment & Plan: Visit Diagnoses:  1. Trigger finger, right ring finger   2. Trigger finger, left ring finger    Plan: Discussed with Juliette Alcide treatment options for her bilateral trigger fingers.  She has had a total of 2 injections in the past which gave her relief for a period of time.  These are now bothersome again for her, the right worse than the left.  We discussed all treatment options such as repeat injection with Band-Aid splint, referral to our hand surgeon for surgical release, custom splinting.  Through shared decision making elected to proceed with bilateral trigger finger injections, she did have associated pain with this but tolerated.  We will place her in the Band-Aid splints for the next 72 hours to attempt to stop repetitive activity.  I would like to see her back in 1 month, if she has 100% relief of her triggering episodes we will let her continue this, however if she is having any recurrence of  her triggering episodes we will proceed with 1 additional trigger finger corticosteroid injection.  She does use her hands a lot for work, could always consider trigger finger splinting.  If she does in fact fail additional injections, would hold off on future ones and likely refer her to our hand specialist for surgical release. F/u in 1 month.  Follow-up: Return in about 1 month (around 04/07/2023) for bilateral trigger finger (possible injections).   Meds & Orders: No orders of the defined types were placed in this encounter.   Orders Placed This Encounter  Procedures   Hand/UE Inj: R ring A1   Hand/UE Inj: L long A1     Procedures: Hand/UE Inj: R ring A1 for trigger finger on 03/08/2023 3:51 PM Indications: pain and tendon swelling Details: 25 G needle, volar approach Medications: 1 mL lidocaine 1 %; 6 mg betamethasone acetate-betamethasone sodium phosphate 6 (3-3) MG/ML Outcome: tolerated well, no immediate complications Procedure, treatment alternatives, risks and benefits explained, specific risks discussed. Consent was given by the patient. Immediately prior to procedure a time out was called to verify the correct patient, procedure, equipment, support staff and site/side marked as required. Patient was prepped and draped in the usual sterile fashion.    Hand/UE Inj: L ring A1 for trigger finger on 03/08/2023 3:51 PM Indications: pain and tendon swelling Details: 25 G needle, volar approach Medications: 1  mL lidocaine 1 %; 6 mg betamethasone acetate-betamethasone sodium phosphate 6 (3-3) MG/ML Outcome: tolerated well, no immediate complications Procedure, treatment alternatives, risks and benefits explained, specific risks discussed. Consent was given by the patient. Immediately prior to procedure a time out was called to verify the correct patient, procedure, equipment, support staff and site/side marked as required. Patient was prepped and draped in the usual sterile fashion.           Clinical History: No specialty comments available.  She reports that she has never smoked. She has never used smokeless tobacco. No results for input(s): "HGBA1C", "LABURIC" in the last 8760 hours.  Objective:   Vital Signs: There were no vitals taken for this visit.  Physical Exam  Gen: Well-appearing, in no acute distress; non-toxic CV: Well-perfused. Warm.  Resp: Breathing unlabored on room air; no wheezing. Psych: Fluid speech in conversation; appropriate affect; normal thought process Neuro: Sensation intact throughout. No gross coordination deficits.   Ortho Exam -Positive TTP over the A1 pulley of bilateral ring fingers, right greater than left.  There is a small degree of palmar skin thickening just distal to the A1 pulley bilaterally.  There is reproducible clicking with clenched fist and open fist.  Neurovascular intact distally.  Imaging: Narrative & Impression  CLINICAL DATA:  62 year old female with left ring finger pain for approximately a month. No known injury.   EXAM: LEFT HAND - COMPLETE 3+ VIEW   COMPARISON:  None.   FINDINGS: Bone mineralization is within normal limits for age. Distal radius and ulna appear intact. Carpal bones appear intact and aligned. Metacarpals appear intact. MCP joint spaces are normal for age. IP joint spaces also appear normal for age. Phalanges appear intact. No acute osseous abnormality identified. No discrete soft tissue abnormality.   IMPRESSION: Negative.     Electronically Signed   By: Odessa Fleming M.D.   On: 12/12/2021 08:27    Past Medical/Family/Surgical/Social History: Medications & Allergies reviewed per EMR, new medications updated. Patient Active Problem List   Diagnosis Date Noted   Tubular adenoma 11/19/2022   Spasm of muscle of lower back 09/18/2022   Biliary dyskinesia 01/21/2022   History of TIA (transient ischemic attack) 01/09/2022   GERD (gastroesophageal reflux disease) 01/07/2022    Cholelithiasis without cholecystitis 01/07/2022   Pyuria 01/06/2022   Cervical spondylosis without myelopathy 01/05/2022   Constipation by delayed colonic transit 01/05/2022   Cervical radiculopathy 01/05/2022   Oropharyngeal dysphagia 01/05/2022   Degeneration of lumbar intervertebral disc 01/05/2022   Hypomagnesemia 01/05/2022   Glucose intolerance (impaired glucose tolerance) 01/05/2022   Hyperlipidemia, unspecified 12/06/2021   Major depressive disorder, single episode, unspecified 12/06/2021   Right arm weakness    Paresthesia    Gastritis 02/04/2021   Intractable nausea and vomiting 02/02/2021   Prolonged QT interval 02/02/2021   High anion gap metabolic acidosis 02/02/2021   OSA (obstructive sleep apnea) 09/10/2020   TIA (transient ischemic attack) 04/29/2020   Essential hypertension 04/29/2020   Hormone replacement therapy (HRT) 09/09/2016   Hypothyroidism 07/16/2014   Sjogren's syndrome (HCC) 04/11/2013   History of ovarian cancer 04/11/2013   Personal history of colonic polyps 04/11/2013   ADD (attention deficit disorder) 07/06/2012   Dysthymia 07/06/2012   Past Medical History:  Diagnosis Date   Arthritis    Cancer (HCC)    OVARIAN   Colonic polyp    Depression    Gastritis    Hyperlipidemia    Hypertension    Hypothyroidism  Sjogren's disease (HCC)    Stroke [facility])    Thyroid disease    HYPOTHYROIDISM   TIA (transient ischemic attack) 04/06/2021   Family History  Problem Relation Age of Onset   Mental illness Mother    Hypertension Mother    Heart failure Mother    CAD Father        CABG at age 84   Mental illness Sister    Alcoholism Sister    Mental illness Brother    Alcoholism Brother    Mental illness Maternal Uncle    Mental illness Maternal Grandmother    Cancer Paternal Grandmother    Past Surgical History:  Procedure Laterality Date   ABDOMINAL HYSTERECTOMY     Ovarian cancer   BIOPSY  02/04/2021   Procedure: BIOPSY;  Surgeon:  Bernette Redbird, MD;  Location: [facility];  Service: Endoscopy;;   BREAST CYST ASPIRATION     CESAREAN SECTION     x2   CHOLECYSTECTOMY N/A 01/23/2022   Procedure: LAPAROSCOPIC CHOLECYSTECTOMY WITH INTRAOPERATIVE CHOLANGIOGRAM;  Surgeon: Darnell Level, MD;  Location: [facility];  Service: General;  Laterality: N/A;   COLONOSCOPY  10-06   Dr. Louie Bun   ESOPHAGOGASTRODUODENOSCOPY N/A 02/04/2021   Procedure: ESOPHAGOGASTRODUODENOSCOPY (EGD);  Surgeon: Bernette Redbird, MD;  Location: [facility];  Service: Endoscopy;  Laterality: N/A;   TONSILLECTOMY     Social History   Occupational History   Not on file  Tobacco Use   Smoking status: Never   Smokeless tobacco: Never  Vaping Use   Vaping Use: Never used  Substance and Sexual Activity   Alcohol use: Yes    Alcohol/week: 4.0 standard drinks of alcohol    Types: 4 Glasses of wine per week    Comment: 05/21/21 1 glasses wine 3 x week   Drug use: No   Sexual activity: Not Currently

## 2023-03-10 ENCOUNTER — Other Ambulatory Visit (HOSPITAL_COMMUNITY): Payer: Self-pay

## 2023-03-10 ENCOUNTER — Other Ambulatory Visit: Payer: Self-pay

## 2023-03-11 ENCOUNTER — Encounter: Payer: Self-pay | Admitting: Family Medicine

## 2023-03-11 ENCOUNTER — Other Ambulatory Visit: Payer: Self-pay

## 2023-03-11 ENCOUNTER — Encounter: Payer: Self-pay | Admitting: Medical

## 2023-03-11 ENCOUNTER — Ambulatory Visit
Admission: RE | Admit: 2023-03-11 | Discharge: 2023-03-11 | Disposition: A | Discharge status: Home / Self Care | Payer: BC Managed Care – PPO | Source: Ambulatory Visit | Attending: Medical | Admitting: Medical

## 2023-03-11 ENCOUNTER — Other Ambulatory Visit (HOSPITAL_COMMUNITY): Payer: Self-pay

## 2023-03-11 ENCOUNTER — Ambulatory Visit: Payer: BC Managed Care – PPO | Admitting: Medical

## 2023-03-11 VITALS — BP 122/70 | HR 80 | Ht 65.0 in | Wt 136.2 lb

## 2023-03-11 DIAGNOSIS — K219 Gastro-esophageal reflux disease without esophagitis: Secondary | ICD-10-CM

## 2023-03-11 DIAGNOSIS — G8929 Other chronic pain: Secondary | ICD-10-CM | POA: Diagnosis not present

## 2023-03-11 DIAGNOSIS — M25562 Pain in left knee: Secondary | ICD-10-CM

## 2023-03-11 DIAGNOSIS — M25462 Effusion, left knee: Secondary | ICD-10-CM | POA: Diagnosis not present

## 2023-03-11 DIAGNOSIS — G2581 Restless legs syndrome: Secondary | ICD-10-CM

## 2023-03-11 MED ORDER — FAMOTIDINE 40 MG PO TABS
40.0000 mg | ORAL_TABLET | Freq: Every day | ORAL | 1 refills | Status: DC
  Filled 2023-03-11: qty 90, 90d supply, fill #0
  Filled 2023-08-26: qty 90, 90d supply, fill #1

## 2023-03-11 MED ORDER — PRAMIPEXOLE DIHYDROCHLORIDE 0.25 MG PO TABS
0.2500 mg | ORAL_TABLET | Freq: Three times a day (TID) | ORAL | 0 refills | Status: DC
  Filled 2023-03-11: qty 90, 30d supply, fill #0

## 2023-03-11 NOTE — Patient Instructions (Signed)
Please go to [facility] for your left knee xray.   Their hours are 8am - 4:30 pm Monday - Friday.  Take your insurance card with you.  [facility] [phone]   [address] [place], [place] [address]

## 2023-03-11 NOTE — Progress Notes (Signed)
Subjective: Chief Complaint  Patient presents with   Follow-up    Recheck on her left knee. Still burns from time to time. Wants to know if you will order pantoprazole for her, previously ordered by GI. Also needs refill on mirapex.   Here for recheck on left knee.   back in march had fallen and injured knee, it would even burn to wear paints or press on knee.  Still having some pains with knee, burning sensation below the kneecap.  No particular activity makes it worse just hurts on and off.  No swelling.  She would like a refill on Protonix.  She was prescribed this by gastroenterology but need to go back for other reasons that GI.  Would like [place] to start doing this.  She does get heartburn intermittently.  Dr. Susann Givens had her on a medicine for restless legs.  She ended up finding it 2 tablets worked better than just 1.  She like a refill on this as it is working.  ROS as in subjective   Past Medical History:  Diagnosis Date   Arthritis    Cancer (HCC)    OVARIAN   Colonic polyp    Depression    Gastritis    Hyperlipidemia    Hypertension    Hypothyroidism    Sjogren's disease (HCC)    Stroke (HCC)    Thyroid disease    HYPOTHYROIDISM   TIA (transient ischemic attack) 04/06/2021   Current Outpatient Medications on File Prior to Visit  Medication Sig Dispense Refill   aspirin EC 81 MG tablet Take 81 mg by mouth every morning. Swallow whole.     atorvastatin (LIPITOR) 80 MG tablet Take 1 tablet (80 mg total) by mouth daily. 90 tablet 1   buPROPion (WELLBUTRIN XL) 150 MG 24 hr tablet Take 1 tablet (150 mg total) by mouth every morning. 90 tablet 1   dexmethylphenidate (FOCALIN XR) 20 MG 24 hr capsule Take 1 oral capsule once a day 30 capsule 0   ezetimibe (ZETIA) 10 MG tablet Take 1 tablet by mouth daily. 90 tablet 3   guanFACINE (TENEX) 2 MG tablet Take 1 tablet (2 mg total) by mouth 2 (two) times daily in the morning and at bedtime. 180 tablet 1   levothyroxine (EUTHYROX) 88  MCG tablet Take 1 tablet (88 mcg total) by mouth daily. 90 tablet 3   pilocarpine (SALAGEN) 5 MG tablet Take 1 tablet (5 mg total) by mouth 4 (four) times daily. 360 tablet 1   propranolol ER (INDERAL LA) 60 MG 24 hr capsule Take 1 capsule (60 mg total) by mouth in the morning. 30 capsule 1   sertraline (ZOLOFT) 100 MG tablet Take 1 tablet (100 mg total) by mouth daily. 90 tablet 1   No current facility-administered medications on file prior to visit.     Objective: BP 122/70   Pulse 80   Ht 5\' 5"  (1.651 m)   Wt 136 lb 3.2 oz (61.8 kg)   BMI 22.66 kg/m   Gen: wd, wn, nad Mainly tender over patellar tendon, otherwise nontender, no other swelling or deformity, no laxity, knee ROM normal Legs neurovascularly intact     Assessment: Encounter Diagnoses  Name Primary?   Chronic pain of left knee Yes   Gastroesophageal reflux disease, unspecified whether esophagitis present    RLS (restless legs syndrome)       Plan: Chronic knee pain since fall 11/2022 - go for xray, discussed possible next steps which could  include PT vs ortho referral, chopat trial, stretching  GERD - discussed risks/benefits of medications such as Famotidine, Protonix.  Given long term risks, she will hold off on PPI and change to H2 famotidine, avoid GERD triggers  RLS - doing fine on current therapy, refills sent   Patient Instructions  Please go to [facility] for your left knee xray.   Their hours are 8am - 4:30 pm Monday - Friday.  Take your insurance card with you.  [facility] [phone]   [address] [place], [place] [address]   Dorothy Wood was seen today for follow-up.  Diagnoses and all orders for this visit:  Chronic pain of left knee -     DG Knee Complete 4 Views Left; Future  Gastroesophageal reflux disease, unspecified whether esophagitis present  RLS (restless legs syndrome)  Other orders -     famotidine (PEPCID) 40 MG tablet; Take 1 tablet (40 mg total) by  mouth daily. -     pramipexole (MIRAPEX) 0.25 MG tablet; Take 1 tablet (0.25 mg total) by mouth 3 (three) times daily.    F/u pending xray

## 2023-03-14 NOTE — Progress Notes (Signed)
There is some swelling in the joint.  No fracture.     I recommend trial of physical therapy or referral to orthopedics for further evaluation, either would be acceptable next step

## 2023-03-16 ENCOUNTER — Other Ambulatory Visit (HOSPITAL_COMMUNITY): Payer: Self-pay

## 2023-03-17 ENCOUNTER — Other Ambulatory Visit (HOSPITAL_COMMUNITY): Payer: Self-pay

## 2023-03-19 ENCOUNTER — Other Ambulatory Visit (HOSPITAL_COMMUNITY): Payer: Self-pay

## 2023-03-19 ENCOUNTER — Telehealth: Payer: BC Managed Care – PPO | Admitting: Nurse Practitioner

## 2023-03-19 ENCOUNTER — Telehealth: Payer: BC Managed Care – PPO | Admitting: Emergency Medicine

## 2023-03-19 ENCOUNTER — Other Ambulatory Visit: Payer: Self-pay

## 2023-03-19 DIAGNOSIS — K146 Glossodynia: Secondary | ICD-10-CM

## 2023-03-19 DIAGNOSIS — R3989 Other symptoms and signs involving the genitourinary system: Secondary | ICD-10-CM

## 2023-03-19 MED ORDER — NYSTATIN 100000 UNIT/ML MT SUSP
5.0000 mL | Freq: Four times a day (QID) | OROMUCOSAL | 0 refills | Status: AC
  Filled 2023-03-19 (×2): qty 150, 8d supply, fill #0

## 2023-03-19 MED ORDER — CEPHALEXIN 500 MG PO CAPS
500.0000 mg | ORAL_CAPSULE | Freq: Two times a day (BID) | ORAL | 0 refills | Status: AC
  Filled 2023-03-19: qty 14, 7d supply, fill #0

## 2023-03-19 NOTE — Progress Notes (Signed)
Virtual Visit Consent   Dorothy Wood, you are scheduled for a virtual visit with a [facility] provider today. Just as with appointments in the office, your consent must be obtained to participate. Your consent will be active for this visit and any virtual visit you may have with one of our providers in the next 365 days. If you have a MyChart account, a copy of this consent can be sent to you electronically.  As this is a virtual visit, video technology does not allow for your provider to perform a traditional examination. This may limit your provider's ability to fully assess your condition. If your provider identifies any concerns that need to be evaluated in person or the need to arrange testing (such as labs, EKG, etc.), we will make arrangements to do so. Although advances in technology are sophisticated, we cannot ensure that it will always work on either your end or our end. If the connection with a video visit is poor, the visit may have to be switched to a telephone visit. With either a video or telephone visit, we are not always able to ensure that we have a secure connection.  By engaging in this virtual visit, you consent to the provision of healthcare and authorize for your insurance to be billed (if applicable) for the services provided during this visit. Depending on your insurance coverage, you may receive a charge related to this service.  I need to obtain your verbal consent now. Are you willing to proceed with your visit today? LEXINGTON ELDRED has provided verbal consent on 03/19/2023 for a virtual visit (video or telephone). Roxy Horseman, PA-C  Date: 03/19/2023 11:16 AM  Virtual Visit via Video Note   I, Roxy Horseman, connected with  Dorothy Wood  ([id number], Sep 13, 1961) on 03/19/23 at 11:15 AM EDT by a video-enabled telemedicine application and verified that I am speaking with the correct person using two identifiers.  Location: Patient: Virtual Visit Location  Patient: Home Provider: Virtual Visit Location Provider: Home   I discussed the limitations of evaluation and management by telemedicine and the availability of in person appointments. The patient expressed understanding and agreed to proceed.    History of Present Illness: Dorothy Wood is a 62 y.o. who identifies as a female who was assigned female at birth, and is being seen today for sore tongue.  States that symptoms have been going on for about a week.  States that she has some white coating on it. Denies using an inhaler. Reports soreness all over.  Denies any throat pain.  HPI: HPI  Problems:  Patient Active Problem List   Diagnosis Date Noted   Tubular adenoma 11/19/2022   Spasm of muscle of lower back 09/18/2022   Biliary dyskinesia 01/21/2022   History of TIA (transient ischemic attack) 01/09/2022   GERD (gastroesophageal reflux disease) 01/07/2022   Cholelithiasis without cholecystitis 01/07/2022   Pyuria 01/06/2022   Cervical spondylosis without myelopathy 01/05/2022   Constipation by delayed colonic transit 01/05/2022   Cervical radiculopathy 01/05/2022   Oropharyngeal dysphagia 01/05/2022   Degeneration of lumbar intervertebral disc 01/05/2022   Hypomagnesemia 01/05/2022   Glucose intolerance (impaired glucose tolerance) 01/05/2022   Hyperlipidemia, unspecified 12/06/2021   Major depressive disorder, single episode, unspecified 12/06/2021   Right arm weakness    Paresthesia    Gastritis 02/04/2021   Intractable nausea and vomiting 02/02/2021   Prolonged QT interval 02/02/2021   High anion gap metabolic acidosis 02/02/2021   OSA (obstructive sleep  apnea) 09/10/2020   TIA (transient ischemic attack) 04/29/2020   Essential hypertension 04/29/2020   Hormone replacement therapy (HRT) 09/09/2016   Hypothyroidism 07/16/2014   Sjogren's syndrome (HCC) 04/11/2013   History of ovarian cancer 04/11/2013   Personal history of colonic polyps 04/11/2013   ADD (attention  deficit disorder) 07/06/2012   Dysthymia 07/06/2012    Allergies:  Allergies  Allergen Reactions   Reglan [Metoclopramide] Shortness Of Breath   Medications:  Current Outpatient Medications:    nystatin (MYCOSTATIN) 100000 UNIT/ML suspension, Take 5 mLs (500,000 Units total) by mouth 4 (four) times daily for 7 days., Disp: 150 mL, Rfl: 0   aspirin EC 81 MG tablet, Take 81 mg by mouth every morning. Swallow whole., Disp: , Rfl:    atorvastatin (LIPITOR) 80 MG tablet, Take 1 tablet (80 mg total) by mouth daily., Disp: 90 tablet, Rfl: 1   buPROPion (WELLBUTRIN XL) 150 MG 24 hr tablet, Take 1 tablet (150 mg total) by mouth every morning., Disp: 90 tablet, Rfl: 1   dexmethylphenidate (FOCALIN XR) 20 MG 24 hr capsule, Take 1 oral capsule once a day, Disp: 30 capsule, Rfl: 0   ezetimibe (ZETIA) 10 MG tablet, Take 1 tablet by mouth daily., Disp: 90 tablet, Rfl: 3   famotidine (PEPCID) 40 MG tablet, Take 1 tablet (40 mg total) by mouth daily., Disp: 90 tablet, Rfl: 1   guanFACINE (TENEX) 2 MG tablet, Take 1 tablet (2 mg total) by mouth 2 (two) times daily in the morning and at bedtime., Disp: 180 tablet, Rfl: 1   levothyroxine (EUTHYROX) 88 MCG tablet, Take 1 tablet (88 mcg total) by mouth daily., Disp: 90 tablet, Rfl: 3   pilocarpine (SALAGEN) 5 MG tablet, Take 1 tablet (5 mg total) by mouth 4 (four) times daily., Disp: 360 tablet, Rfl: 1   pramipexole (MIRAPEX) 0.25 MG tablet, Take 1 tablet (0.25 mg total) by mouth 3 (three) times daily., Disp: 90 tablet, Rfl: 0   propranolol ER (INDERAL LA) 60 MG 24 hr capsule, Take 1 capsule (60 mg total) by mouth in the morning., Disp: 30 capsule, Rfl: 1   sertraline (ZOLOFT) 100 MG tablet, Take 1 tablet (100 mg total) by mouth daily., Disp: 90 tablet, Rfl: 1  Observations/Objective: Patient is well-developed, well-nourished in no acute distress.  Resting comfortably at home.  Head is normocephalic, atraumatic.  Mild white patches seen on tongue No labored  breathing.  Speech is clear and coherent with logical content.  Patient is alert and oriented at baseline.    Assessment and Plan: 1. Tongue pain   Meds ordered this encounter  Medications   nystatin (MYCOSTATIN) 100000 UNIT/ML suspension    Sig: Take 5 mLs (500,000 Units total) by mouth 4 (four) times daily for 7 days.    Dispense:  150 mL    Refill:  0    Order Specific Question:   Supervising Provider    Answer:   Merrilee Jansky [id number]   - with white patches and tongue pain, concern for oral candidiasis.  Will treat with nystatin rinse. - Follow-up if not improving.  Follow Up Instructions: I discussed the assessment and treatment plan with the patient. The patient was provided an opportunity to ask questions and all were answered. The patient agreed with the plan and demonstrated an understanding of the instructions.  A copy of instructions were sent to the patient via MyChart unless otherwise noted below.     The patient was advised to call back or seek  an in-person evaluation if the symptoms worsen or if the condition fails to improve as anticipated.  Time:  I spent 10 minutes with the patient via telehealth technology discussing the above problems/concerns.    Roxy Horseman, PA-C

## 2023-03-19 NOTE — Progress Notes (Signed)
E-Visit for Urinary Problems  We are sorry that you are not feeling well.  Here is how we plan to help!  Based on what you shared with me it looks like you most likely have a simple urinary tract infection.  A UTI (Urinary Tract Infection) is a bacterial infection of the bladder.  Most cases of urinary tract infections are simple to treat but a key part of your care is to encourage you to drink plenty of fluids and watch your symptoms carefully.  I have prescribed Keflex 500 mg twice a day for 7 days.  Your symptoms should gradually improve. Call us if the burning in your urine worsens, you develop worsening fever, back pain or pelvic pain or if your symptoms do not resolve after completing the antibiotic.  Urinary tract infections can be prevented by drinking plenty of water to keep your body hydrated.  Also be sure when you wipe, wipe from front to back and don't hold it in!  If possible, empty your bladder every 4 hours.  HOME CARE Drink plenty of fluids Compete the full course of the antibiotics even if the symptoms resolve Remember, when you need to go.go. Holding in your urine can increase the likelihood of getting a UTI! GET HELP RIGHT AWAY IF: You cannot urinate You get a high fever Worsening back pain occurs You see blood in your urine You feel sick to your stomach or throw up You feel like you are going to pass out  MAKE SURE YOU  Understand these instructions. Will watch your condition. Will get help right away if you are not doing well or get worse.   Thank you for choosing an e-visit.  Your e-visit answers were reviewed by a board certified advanced clinical practitioner to complete your personal care plan. Depending upon the condition, your plan could have included both over the counter or prescription medications.  Please review your pharmacy choice. Make sure the pharmacy is open so you can pick up prescription now. If there is a problem, you may contact your  provider through MyChart messaging and have the prescription routed to another pharmacy.  Your safety is important to us. If you have drug allergies check your prescription carefully.   For the next 24 hours you can use MyChart to ask questions about today's visit, request a non-urgent call back, or ask for a work or school excuse. You will get an email in the next two days asking about your experience. I hope that your e-visit has been valuable and will speed your recovery.   Meds ordered this encounter  Medications   cephALEXin (KEFLEX) 500 MG capsule    Sig: Take 1 capsule (500 mg total) by mouth 2 (two) times daily for 7 days.    Dispense:  14 capsule    Refill:  0    I spent approximately 5 minutes reviewing the patient's history, current symptoms and coordinating their care today.   

## 2023-03-19 NOTE — Patient Instructions (Signed)
[name], thank you for joining Roxy Horseman, PA-C for today's virtual visit.  While this provider is not your primary care provider (PCP), if your PCP is located in our provider database this encounter information will be shared with them immediately following your visit.   A [facility] MyChart account gives you access to today's visit and all your visits, tests, and labs performed at [facility] " click here if you don't have a [facility] MyChart account or go to [email or web address]  Consent: (Patient) Dorothy Wood provided verbal consent for this virtual visit at the beginning of the encounter.  Current Medications:  Current Outpatient Medications:    nystatin (MYCOSTATIN) 100000 UNIT/ML suspension, Take 5 mLs (500,000 Units total) by mouth 4 (four) times daily for 7 days., Disp: 150 mL, Rfl: 0   aspirin EC 81 MG tablet, Take 81 mg by mouth every morning. Swallow whole., Disp: , Rfl:    atorvastatin (LIPITOR) 80 MG tablet, Take 1 tablet (80 mg total) by mouth daily., Disp: 90 tablet, Rfl: 1   buPROPion (WELLBUTRIN XL) 150 MG 24 hr tablet, Take 1 tablet (150 mg total) by mouth every morning., Disp: 90 tablet, Rfl: 1   dexmethylphenidate (FOCALIN XR) 20 MG 24 hr capsule, Take 1 oral capsule once a day, Disp: 30 capsule, Rfl: 0   ezetimibe (ZETIA) 10 MG tablet, Take 1 tablet by mouth daily., Disp: 90 tablet, Rfl: 3   famotidine (PEPCID) 40 MG tablet, Take 1 tablet (40 mg total) by mouth daily., Disp: 90 tablet, Rfl: 1   guanFACINE (TENEX) 2 MG tablet, Take 1 tablet (2 mg total) by mouth 2 (two) times daily in the morning and at bedtime., Disp: 180 tablet, Rfl: 1   levothyroxine (EUTHYROX) 88 MCG tablet, Take 1 tablet (88 mcg total) by mouth daily., Disp: 90 tablet, Rfl: 3   pilocarpine (SALAGEN) 5 MG tablet, Take 1 tablet (5 mg total) by mouth 4 (four) times daily., Disp: 360 tablet, Rfl: 1   pramipexole (MIRAPEX) 0.25 MG tablet, Take 1 tablet (0.25 mg total)  by mouth 3 (three) times daily., Disp: 90 tablet, Rfl: 0   propranolol ER (INDERAL LA) 60 MG 24 hr capsule, Take 1 capsule (60 mg total) by mouth in the morning., Disp: 30 capsule, Rfl: 1   sertraline (ZOLOFT) 100 MG tablet, Take 1 tablet (100 mg total) by mouth daily., Disp: 90 tablet, Rfl: 1   Medications ordered in this encounter:  Meds ordered this encounter  Medications   nystatin (MYCOSTATIN) 100000 UNIT/ML suspension    Sig: Take 5 mLs (500,000 Units total) by mouth 4 (four) times daily for 7 days.    Dispense:  150 mL    Refill:  0    Order Specific Question:   Supervising Provider    Answer:   Merrilee Jansky [[id number]]     *If you need refills on other medications prior to your next appointment, please contact your pharmacy*  Follow-Up: Call back or seek an in-person evaluation if the symptoms worsen or if the condition fails to improve as anticipated.  [facility] [phone]  Other Instructions    If you have been instructed to have an in-person evaluation today at a local Urgent Care facility, please use the link below. It will take you to a list of all of our available [facility], including address, phone number and hours of operation. Please do not delay care.  [facility]  If you or a family member do not have a primary care provider, use the link below to schedule a visit and establish care. When you choose a [facility] primary care physician or advanced practice provider, you gain a long-term partner in health. Find a Primary Care Provider  Learn more about [facility]'s in-office and virtual care options: [facility] - Get Care Now

## 2023-03-22 ENCOUNTER — Encounter: Payer: Self-pay | Admitting: Neurology

## 2023-03-22 ENCOUNTER — Encounter: Payer: Self-pay | Admitting: Orthopaedic Surgery

## 2023-03-24 ENCOUNTER — Other Ambulatory Visit (HOSPITAL_COMMUNITY): Payer: Self-pay

## 2023-03-29 ENCOUNTER — Other Ambulatory Visit (HOSPITAL_COMMUNITY): Payer: Self-pay

## 2023-03-29 ENCOUNTER — Other Ambulatory Visit: Payer: Self-pay | Admitting: Family Medicine

## 2023-03-29 MED ORDER — PROPRANOLOL HCL ER 60 MG PO CP24
60.0000 mg | ORAL_CAPSULE | Freq: Every morning | ORAL | 1 refills | Status: DC
  Filled 2023-03-29: qty 30, 30d supply, fill #0
  Filled 2023-05-17: qty 30, 30d supply, fill #1

## 2023-03-31 ENCOUNTER — Ambulatory Visit: Payer: BC Managed Care – PPO | Admitting: Behavioral Health

## 2023-03-31 DIAGNOSIS — Z789 Other specified health status: Secondary | ICD-10-CM | POA: Diagnosis not present

## 2023-03-31 DIAGNOSIS — F4322 Adjustment disorder with anxiety: Secondary | ICD-10-CM | POA: Diagnosis not present

## 2023-03-31 DIAGNOSIS — F4321 Adjustment disorder with depressed mood: Secondary | ICD-10-CM | POA: Diagnosis not present

## 2023-03-31 NOTE — Progress Notes (Deleted)
                Victoria L Winstead, LMFT 

## 2023-03-31 NOTE — Progress Notes (Signed)
                Victoria L Winstead, LMFT 

## 2023-04-02 ENCOUNTER — Other Ambulatory Visit (HOSPITAL_COMMUNITY): Payer: Self-pay

## 2023-04-05 ENCOUNTER — Other Ambulatory Visit (HOSPITAL_COMMUNITY): Payer: Self-pay

## 2023-04-05 DIAGNOSIS — F902 Attention-deficit hyperactivity disorder, combined type: Secondary | ICD-10-CM | POA: Diagnosis not present

## 2023-04-05 DIAGNOSIS — F330 Major depressive disorder, recurrent, mild: Secondary | ICD-10-CM | POA: Diagnosis not present

## 2023-04-05 DIAGNOSIS — G4700 Insomnia, unspecified: Secondary | ICD-10-CM | POA: Diagnosis not present

## 2023-04-05 DIAGNOSIS — F419 Anxiety disorder, unspecified: Secondary | ICD-10-CM | POA: Diagnosis not present

## 2023-04-05 MED ORDER — COTEMPLA XR-ODT 17.3 MG PO TBED: 17.3000 mg | EXTENDED_RELEASE_TABLET | Freq: Every morning | ORAL | 0 refills | Status: DC

## 2023-04-05 MED ORDER — BUPROPION HCL ER (XL) 150 MG PO TB24
150.0000 mg | ORAL_TABLET | Freq: Every morning | ORAL | 1 refills | Status: DC
  Filled 2023-04-05: qty 90, 90d supply, fill #0

## 2023-04-05 MED ORDER — COTEMPLA XR-ODT 17.3 MG PO TBED
17.3000 mg | EXTENDED_RELEASE_TABLET | Freq: Every morning | ORAL | 0 refills | Status: DC
  Filled 2023-04-05 – 2023-05-11 (×3): qty 30, 30d supply, fill #0

## 2023-04-05 MED ORDER — SERTRALINE HCL 100 MG PO TABS
100.0000 mg | ORAL_TABLET | Freq: Every day | ORAL | 1 refills | Status: AC
  Filled 2023-04-05 – 2023-07-13 (×2): qty 90, 90d supply, fill #0
  Filled 2024-02-04: qty 90, 90d supply, fill #1

## 2023-04-07 ENCOUNTER — Encounter: Payer: Self-pay | Admitting: Sports Medicine

## 2023-04-07 ENCOUNTER — Ambulatory Visit: Payer: BC Managed Care – PPO | Admitting: Sports Medicine

## 2023-04-07 DIAGNOSIS — M65342 Trigger finger, left ring finger: Secondary | ICD-10-CM

## 2023-04-07 DIAGNOSIS — M65341 Trigger finger, right ring finger: Secondary | ICD-10-CM

## 2023-04-07 NOTE — Progress Notes (Signed)
Doing really well Injection helped a lot Has not had any triggering episodes since last visit

## 2023-04-07 NOTE — Progress Notes (Signed)
Dorothy Wood - 62 y.o. female MRN [id number]  Date of birth: 1961/05/17  Office Visit Note: Visit Date: 04/07/2023 PCP: Ronnald Nian, MD Referred by: Ronnald Nian, MD  Subjective: Chief Complaint  Patient presents with   Left Hand - Follow-up   Right Hand - Follow-up   HPI: Dorothy Wood is a pleasant 62 y.o. female who presents today for follow-up of bilateral ring trigger fingers.  History of this in past, has had a total of two injections over the years. Last was with me on 03/08/23. Since these injections she has not had any recurrence of her triggering since last visit.  Took a few days of taking the pain has been doing great.  No pain, no triggering.  Pertinent ROS were reviewed with the patient and found to be negative unless otherwise specified above in HPI.   Assessment & Plan: Visit Diagnoses:  1. Trigger finger, right ring finger   2. Trigger finger, left ring finger    Plan: Dorothy Wood has had resolution of her trigger fingers of bilateral ring finger after corticosteroid injection 1 month ago.  Has not had any recurrence of her symptoms.  Will forego future injection.  Discussed prevention such as adequate rest, warm water, and active stretching of the fingers.  If she has any reoccurrence, we did talk about surgical options versus repeat injection therapy.   Did discuss with Dorothy Wood that she has early symptoms of triggering to call/message me and consider sending him a short course of prednisone or anti-inflammatory medication to prevent triggering from progressing.  Follow-up: Return if symptoms worsen or fail to improve.   Meds & Orders: No orders of the defined types were placed in this encounter.  No orders of the defined types were placed in this encounter.    Procedures: No procedures performed      Clinical History: No specialty comments available.  She reports that she has never smoked. She has never used smokeless tobacco. No results for  input(s): "HGBA1C", "LABURIC" in the last 8760 hours.  Objective:   Vital Signs: There were no vitals taken for this visit.  Physical Exam  Gen: Well-appearing, in no acute distress; non-toxic CV: Well-perfused. Warm.  Resp: Breathing unlabored on room air; no wheezing. Psych: Fluid speech in conversation; appropriate affect; normal thought process Neuro: Sensation intact throughout. No gross coordination deficits.   Ortho Exam - Bilateral hands: Able to perform open and close fist without pain or triggering.  No tenderness over the nodules of the ring fingers.  No active or passive triggering episodes noted.  Cap refill less than 2 seconds.  Imaging: No results found.  Past Medical/Family/Surgical/Social History: Medications & Allergies reviewed per EMR, new medications updated. Patient Active Problem List   Diagnosis Date Noted   Tubular adenoma 11/19/2022   Spasm of muscle of lower back 09/18/2022   Biliary dyskinesia 01/21/2022   History of TIA (transient ischemic attack) 01/09/2022   GERD (gastroesophageal reflux disease) 01/07/2022   Cholelithiasis without cholecystitis 01/07/2022   Pyuria 01/06/2022   Cervical spondylosis without myelopathy 01/05/2022   Constipation by delayed colonic transit 01/05/2022   Cervical radiculopathy 01/05/2022   Oropharyngeal dysphagia 01/05/2022   Degeneration of lumbar intervertebral disc 01/05/2022   Hypomagnesemia 01/05/2022   Glucose intolerance (impaired glucose tolerance) 01/05/2022   Hyperlipidemia, unspecified 12/06/2021   Major depressive disorder, single episode, unspecified 12/06/2021   Right arm weakness    Paresthesia    Gastritis 02/04/2021   Intractable nausea  and vomiting 02/02/2021   Prolonged QT interval 02/02/2021   High anion gap metabolic acidosis 02/02/2021   OSA (obstructive sleep apnea) 09/10/2020   TIA (transient ischemic attack) 04/29/2020   Essential hypertension 04/29/2020   Hormone replacement therapy  (HRT) 09/09/2016   Hypothyroidism 07/16/2014   Sjogren's syndrome (HCC) 04/11/2013   History of ovarian cancer 04/11/2013   Personal history of colonic polyps 04/11/2013   ADD (attention deficit disorder) 07/06/2012   Dysthymia 07/06/2012   Past Medical History:  Diagnosis Date   Arthritis    Cancer (HCC)    OVARIAN   Colonic polyp    Depression    Gastritis    Hyperlipidemia    Hypertension    Hypothyroidism    Sjogren's disease (HCC)    Stroke (HCC)    Thyroid disease    HYPOTHYROIDISM   TIA (transient ischemic attack) 04/06/2021   Family History  Problem Relation Age of Onset   Mental illness Mother    Hypertension Mother    Heart failure Mother    CAD Father        CABG at age 73   Mental illness Sister    Alcoholism Sister    Mental illness Brother    Alcoholism Brother    Mental illness Maternal Uncle    Mental illness Maternal Grandmother    Cancer Paternal Grandmother    Past Surgical History:  Procedure Laterality Date   ABDOMINAL HYSTERECTOMY     Ovarian cancer   BIOPSY  02/04/2021   Procedure: BIOPSY;  Surgeon: Bernette Redbird, MD;  Location: [facility];  Service: Endoscopy;;   BREAST CYST ASPIRATION     CESAREAN SECTION     x2   CHOLECYSTECTOMY N/A 01/23/2022   Procedure: LAPAROSCOPIC CHOLECYSTECTOMY WITH INTRAOPERATIVE CHOLANGIOGRAM;  Surgeon: Darnell Level, MD;  Location: [facility];  Service: General;  Laterality: N/A;   COLONOSCOPY  10-06   Dr. Louie Bun   ESOPHAGOGASTRODUODENOSCOPY N/A 02/04/2021   Procedure: ESOPHAGOGASTRODUODENOSCOPY (EGD);  Surgeon: Bernette Redbird, MD;  Location: [facility];  Service: Endoscopy;  Laterality: N/A;   TONSILLECTOMY     Social History   Occupational History   Not on file  Tobacco Use   Smoking status: Never   Smokeless tobacco: Never  Vaping Use   Vaping status: Never Used  Substance and Sexual Activity   Alcohol use: Yes    Alcohol/week: 4.0 standard drinks of alcohol    Types: 4 Glasses of wine per  week    Comment: 05/21/21 1 glasses wine 3 x week   Drug use: No   Sexual activity: Not Currently

## 2023-04-07 NOTE — Progress Notes (Signed)
[facility] Counselor/Therapist Progress Note  Patient ID: Dorothy Wood, MRN: [id number],    Date: 04/07/2023  Time Spent: 55 min In Person @ [facility] Office Time In: 9:00am Time Out: 9:55am  Treatment Type: Individual Therapy  Reported Symptoms: Reduced anx/dep & stress since she has been handling her Husb's belongings since his death in 10/27/2022. Her Dtr & best friend Sue Lush are helping her to make the headway she feels energy for @ this time. She is recalling her past Hx today & putting her life & direction in perspective.   Mental Status Exam: Appearance:  Casual     Behavior: Appropriate and Sharing  Motor: Normal w/some restlessness  Speech/Language:  Clear and Coherent  Affect: Appropriate  Mood: anxious  Thought process: normal & goal-directed  Thought content:   WNL; starting to grieve her Husb's loss & also put her Hx w/him in perspective  Sensory/Perceptual disturbances:   WNL  Orientation: oriented to person, place, time & situation  Attention: Good  Concentration: Good  Memory: Pt has concerns since her Family has a Mat Hx of brain issues  Fund of knowledge:  Good  Insight:   Good  Judgment:  Good  Impulse Control: Good   Risk Assessment: Danger to Self:  No Self-injurious Behavior: No Danger to Others: No Duty to Warn:no Physical Aggression / Violence:No  Access to Firearms a concern: No  Gang Involvement:No   Subjective: Tandra is looking @ her marriage in a realistic light w/respect for her Husb & some, "delayed grieving". She feels rested & sts her, "sleep is caught up". She is exercising, sleeping 8 hrs/day. She is doing STARTS w/Hospice. She is dealing w/the trigger finger pain she is exp'g in both hands-shots help & wearing a splint. Her Dtr who is local is helpful, but also annoying her a bit! She will visit her Dtr up [place] soon & looks forward to the change of location.   Her good friend from childhood checks in daily & they are  moving things out of the home slowly. She feels this is progress. Nighttime is the hardest. Her worry at times is consuming & she is trying to manage her stress.   Interventions: Grief Therapy and Family Systems, normalization/validation of grief rxn & the timing is common  Diagnosis:Grief reaction  Adjustment disorder with anxious mood  Problems influencing health status  Plan: Avelynn wonders if her grief process is normal. Discussed this @ length. She is trying to be patient w/herself as she moves into her life w/o her Husb Brett Canales. She is finding it hard to cry. She has busy plans for the Summer. Requested Neli spend adequate alone time & to self-nurture as she moves forward. Rely on your Notebook more to process & track your progress.   Target Date: 04/29/2023  Progress: 6  Frequency: Once every 3-4 wks  Modality: Claretta Fraise, LMFT

## 2023-04-16 ENCOUNTER — Other Ambulatory Visit: Payer: Self-pay

## 2023-04-19 ENCOUNTER — Ambulatory Visit: Payer: BC Managed Care – PPO | Admitting: Behavioral Health

## 2023-04-23 ENCOUNTER — Other Ambulatory Visit (HOSPITAL_COMMUNITY): Payer: Self-pay

## 2023-05-04 ENCOUNTER — Other Ambulatory Visit (HOSPITAL_COMMUNITY): Payer: Self-pay

## 2023-05-07 ENCOUNTER — Other Ambulatory Visit (HOSPITAL_COMMUNITY): Payer: Self-pay

## 2023-05-10 NOTE — Telephone Encounter (Signed)
Called and scheduled pt. in February. Pt has been added to the wait list as well.

## 2023-05-11 ENCOUNTER — Other Ambulatory Visit (HOSPITAL_COMMUNITY): Payer: Self-pay

## 2023-05-13 ENCOUNTER — Other Ambulatory Visit (HOSPITAL_COMMUNITY): Payer: Self-pay

## 2023-05-13 DIAGNOSIS — G4700 Insomnia, unspecified: Secondary | ICD-10-CM | POA: Diagnosis not present

## 2023-05-13 DIAGNOSIS — F902 Attention-deficit hyperactivity disorder, combined type: Secondary | ICD-10-CM | POA: Diagnosis not present

## 2023-05-13 DIAGNOSIS — F419 Anxiety disorder, unspecified: Secondary | ICD-10-CM | POA: Diagnosis not present

## 2023-05-13 DIAGNOSIS — F330 Major depressive disorder, recurrent, mild: Secondary | ICD-10-CM | POA: Diagnosis not present

## 2023-05-13 MED ORDER — METHYLPHENIDATE HCL ER (OSM) 36 MG PO TBCR
36.0000 mg | EXTENDED_RELEASE_TABLET | Freq: Every morning | ORAL | 0 refills | Status: DC
  Filled 2023-05-13: qty 30, 30d supply, fill #0

## 2023-05-17 ENCOUNTER — Other Ambulatory Visit: Payer: Self-pay | Admitting: Family Medicine

## 2023-05-17 ENCOUNTER — Other Ambulatory Visit (HOSPITAL_COMMUNITY): Payer: Self-pay

## 2023-05-18 ENCOUNTER — Other Ambulatory Visit: Payer: Self-pay

## 2023-05-18 ENCOUNTER — Other Ambulatory Visit (HOSPITAL_COMMUNITY): Payer: Self-pay

## 2023-05-18 MED ORDER — ATORVASTATIN CALCIUM 80 MG PO TABS
80.0000 mg | ORAL_TABLET | Freq: Every day | ORAL | 0 refills | Status: DC
  Filled 2023-05-18: qty 90, 90d supply, fill #0

## 2023-05-23 ENCOUNTER — Other Ambulatory Visit: Payer: Self-pay | Admitting: Medical

## 2023-05-23 ENCOUNTER — Other Ambulatory Visit: Payer: Self-pay | Admitting: Family Medicine

## 2023-05-24 ENCOUNTER — Other Ambulatory Visit (HOSPITAL_COMMUNITY): Payer: Self-pay

## 2023-05-24 MED ORDER — PRAMIPEXOLE DIHYDROCHLORIDE 0.25 MG PO TABS
0.2500 mg | ORAL_TABLET | Freq: Three times a day (TID) | ORAL | 0 refills | Status: DC
  Filled 2023-05-24: qty 90, 30d supply, fill #0

## 2023-05-25 ENCOUNTER — Other Ambulatory Visit: Payer: Self-pay

## 2023-05-25 ENCOUNTER — Other Ambulatory Visit (HOSPITAL_COMMUNITY): Payer: Self-pay

## 2023-05-26 ENCOUNTER — Other Ambulatory Visit: Payer: Self-pay

## 2023-06-04 ENCOUNTER — Emergency Department (HOSPITAL_COMMUNITY)
Admission: EM | Admit: 2023-06-04 | Discharge: 2023-06-04 | Disposition: A | Discharge status: Home / Self Care | Payer: BC Managed Care – PPO | Attending: Emergency Medicine | Admitting: Emergency Medicine

## 2023-06-04 ENCOUNTER — Emergency Department (HOSPITAL_COMMUNITY): Payer: BC Managed Care – PPO

## 2023-06-04 ENCOUNTER — Other Ambulatory Visit: Payer: Self-pay

## 2023-06-04 DIAGNOSIS — K3184 Gastroparesis: Secondary | ICD-10-CM | POA: Diagnosis not present

## 2023-06-04 DIAGNOSIS — R109 Unspecified abdominal pain: Secondary | ICD-10-CM | POA: Diagnosis not present

## 2023-06-04 DIAGNOSIS — I4581 Long QT syndrome: Secondary | ICD-10-CM | POA: Insufficient documentation

## 2023-06-04 DIAGNOSIS — Z7982 Long term (current) use of aspirin: Secondary | ICD-10-CM | POA: Insufficient documentation

## 2023-06-04 DIAGNOSIS — R9431 Abnormal electrocardiogram [ECG] [EKG]: Secondary | ICD-10-CM

## 2023-06-04 DIAGNOSIS — R1013 Epigastric pain: Secondary | ICD-10-CM | POA: Diagnosis not present

## 2023-06-04 DIAGNOSIS — R112 Nausea with vomiting, unspecified: Secondary | ICD-10-CM | POA: Diagnosis not present

## 2023-06-04 DIAGNOSIS — R110 Nausea: Secondary | ICD-10-CM | POA: Diagnosis not present

## 2023-06-04 DIAGNOSIS — R1110 Vomiting, unspecified: Secondary | ICD-10-CM | POA: Diagnosis not present

## 2023-06-04 DIAGNOSIS — I10 Essential (primary) hypertension: Secondary | ICD-10-CM | POA: Diagnosis not present

## 2023-06-04 LAB — CBC
HCT: 41.7 % (ref 36.0–46.0)
Hemoglobin: 14 g/dL (ref 12.0–15.0)
MCH: 29.7 pg (ref 26.0–34.0)
MCHC: 33.6 g/dL (ref 30.0–36.0)
MCV: 88.3 fL (ref 80.0–100.0)
Platelets: 308 10*3/uL (ref 150–400)
RBC: 4.72 MIL/uL (ref 3.87–5.11)
RDW: 12.7 % (ref 11.5–15.5)
WBC: 12.9 10*3/uL — ABNORMAL HIGH (ref 4.0–10.5)
nRBC: 0 % (ref 0.0–0.2)

## 2023-06-04 LAB — BASIC METABOLIC PANEL
Anion gap: 10 (ref 5–15)
BUN: 10 mg/dL (ref 8–23)
CO2: 21 mmol/L — ABNORMAL LOW (ref 22–32)
Calcium: 8.7 mg/dL — ABNORMAL LOW (ref 8.9–10.3)
Chloride: 109 mmol/L (ref 98–111)
Creatinine, Ser: 0.72 mg/dL (ref 0.44–1.00)
GFR, Estimated: >60 mL/min (ref >60)
Glucose, Bld: 132 mg/dL — ABNORMAL HIGH (ref 70–99)
Potassium: 3.4 mmol/L — ABNORMAL LOW (ref 3.5–5.1)
Sodium: 140 mmol/L (ref 135–145)

## 2023-06-04 LAB — LIPASE, BLOOD: Lipase: 25 U/L (ref 11–51)

## 2023-06-04 LAB — URINALYSIS, ROUTINE W REFLEX MICROSCOPIC
Bilirubin Urine: NEGATIVE
Glucose, UA: NEGATIVE mg/dL
Hgb urine dipstick: NEGATIVE
Ketones, ur: 20 mg/dL — AB
Leukocytes,Ua: NEGATIVE
Nitrite: NEGATIVE
Protein, ur: NEGATIVE mg/dL
Specific Gravity, Urine: 1.011 (ref 1.005–1.030)
pH: 8 (ref 5.0–8.0)

## 2023-06-04 LAB — COMPREHENSIVE METABOLIC PANEL
ALT: 46 U/L — ABNORMAL HIGH (ref 0–44)
AST: 44 U/L — ABNORMAL HIGH (ref 15–41)
Albumin: 4.5 g/dL (ref 3.5–5.0)
Alkaline Phosphatase: 104 U/L (ref 38–126)
Anion gap: 18 — ABNORMAL HIGH (ref 5–15)
BUN: 11 mg/dL (ref 8–23)
CO2: 15 mmol/L — ABNORMAL LOW (ref 22–32)
Calcium: 10.3 mg/dL (ref 8.9–10.3)
Chloride: 105 mmol/L (ref 98–111)
Creatinine, Ser: 0.82 mg/dL (ref 0.44–1.00)
GFR, Estimated: >60 mL/min (ref >60)
Glucose, Bld: 192 mg/dL — ABNORMAL HIGH (ref 70–99)
Potassium: 3.2 mmol/L — ABNORMAL LOW (ref 3.5–5.1)
Sodium: 138 mmol/L (ref 135–145)
Total Bilirubin: 0.9 mg/dL (ref 0.3–1.2)
Total Protein: 7.6 g/dL (ref 6.5–8.1)

## 2023-06-04 LAB — BLOOD GAS, VENOUS
Acid-base deficit: 2.8 mmol/L — ABNORMAL HIGH (ref 0.0–2.0)
Bicarbonate: 16.5 mmol/L — ABNORMAL LOW (ref 20.0–28.0)
O2 Saturation: 55.2 %
Patient temperature: 37
pCO2, Ven: 18 mmHg — CL (ref 44–60)
pH, Ven: 7.57 — ABNORMAL HIGH (ref 7.25–7.43)
pO2, Ven: <31 mmHg — CL (ref 32–45)

## 2023-06-04 LAB — TROPONIN I (HIGH SENSITIVITY)
Troponin I (High Sensitivity): 3 ng/L (ref <18)
Troponin I (High Sensitivity): 3 ng/L (ref <18)

## 2023-06-04 MED ORDER — SODIUM CHLORIDE 0.9 % IV SOLN: INTRAVENOUS | Status: DC

## 2023-06-04 MED ORDER — LORAZEPAM 2 MG/ML IJ SOLN
1.0000 mg | Freq: Once | INTRAMUSCULAR | Status: AC
  Administered 2023-06-04: 1 mg via INTRAVENOUS
  Filled 2023-06-04: qty 1

## 2023-06-04 MED ORDER — ONDANSETRON HCL 4 MG/2ML IJ SOLN
4.0000 mg | Freq: Once | INTRAMUSCULAR | Status: AC
  Administered 2023-06-04: 4 mg via INTRAVENOUS
  Filled 2023-06-04: qty 2

## 2023-06-04 MED ORDER — HYDROMORPHONE HCL 1 MG/ML IJ SOLN
0.5000 mg | Freq: Once | INTRAMUSCULAR | Status: AC
  Administered 2023-06-04: 0.5 mg via INTRAVENOUS
  Filled 2023-06-04: qty 1

## 2023-06-04 MED ORDER — SODIUM CHLORIDE 0.9 % IV BOLUS
1000.0000 mL | Freq: Once | INTRAVENOUS | Status: AC
  Administered 2023-06-04: 1000 mL via INTRAVENOUS

## 2023-06-04 MED ORDER — PANTOPRAZOLE SODIUM 40 MG IV SOLR
40.0000 mg | Freq: Once | INTRAVENOUS | Status: AC
  Administered 2023-06-04: 40 mg via INTRAVENOUS
  Filled 2023-06-04: qty 10

## 2023-06-04 NOTE — ED Triage Notes (Signed)
Pt arrived via POV. C/o abd pain, N/V since midnight. Pt has hx of gastroparesis and feels like this might be a flareup. Home zofran was not effective

## 2023-06-04 NOTE — ED Notes (Signed)
Patient said she is not nauseous right now

## 2023-06-04 NOTE — ED Provider Notes (Signed)
[place] EMERGENCY DEPARTMENT AT [facility] Provider Note   CSN: [id number] Arrival date & time: 06/04/23  6237     History  Chief Complaint  Patient presents with   Nausea   Emesis   Abdominal Pain    Dorothy Wood is a 62 y.o. female.   Emesis Associated symptoms: abdominal pain   Abdominal Pain Associated symptoms: vomiting      Pt states she has been vomiting since midnight last night.  SHe has noted green yellow emesis.  SHe is having some burning in her chest as well as pain in the upper abdomen and mid chest.  Pt has history of gastroparesis and this feels similar.  Pt has had prior gallbladder surgery. No prior bowel obstruction.  No alcohol or marijuana use.   Pt sees [facility] for gastroparesis.  SHe takes an antacid  Home Medications Prior to Admission medications   Medication Sig Start Date End Date Taking? Authorizing Provider  aspirin EC 81 MG tablet Take 81 mg by mouth every morning. Swallow whole.    [provider]  atorvastatin (LIPITOR) 80 MG tablet Take 1 tablet (80 mg total) by mouth daily. 05/18/23   Ronnald Nian, MD  buPROPion (WELLBUTRIN XL) 150 MG 24 hr tablet Take 1 tablet (150 mg total) by mouth every morning. 11/12/22     buPROPion (WELLBUTRIN XL) 150 MG 24 hr tablet Take 1 tablet (150 mg total) by mouth every morning. 04/05/23     dexmethylphenidate (FOCALIN XR) 20 MG 24 hr capsule Take 1 oral capsule once a day 01/27/23     ezetimibe (ZETIA) 10 MG tablet Take 1 tablet by mouth daily. 05/01/22 06/08/23  Alver Sorrow, NP  famotidine (PEPCID) 40 MG tablet Take 1 tablet (40 mg total) by mouth daily. 03/11/23   Tysinger, Kermit Balo, PA-C  guanFACINE (TENEX) 2 MG tablet Take 1 tablet (2 mg total) by mouth 2 (two) times daily in the morning and at bedtime. 07/27/22     levothyroxine (EUTHYROX) 88 MCG tablet Take 1 tablet (88 mcg total) by mouth daily. 02/22/23     Methylphenidate (COTEMPLA XR-ODT) 17.3 MG TBED Take 1 tablet (17.3 mg  total) by mouth every morning. 04/05/23     Methylphenidate (COTEMPLA XR-ODT) 17.3 MG TBED Take 1 tablet (17.3 mg total) by mouth every morning. 05/03/23 05/03/23     methylphenidate 36 MG PO CR tablet Take 1 tablet (36 mg total) by mouth every morning. 05/13/23     pilocarpine (SALAGEN) 5 MG tablet Take 1 tablet (5 mg total) by mouth 4 (four) times daily. 03/02/23 03/01/24  Ronnald Nian, MD  pramipexole (MIRAPEX) 0.25 MG tablet Take 1 tablet (0.25 mg total) by mouth 3 (three) times daily. 05/24/23   Ronnald Nian, MD  propranolol ER (INDERAL LA) 60 MG 24 hr capsule Take 1 capsule (60 mg) by mouth in the morning. 03/29/23   Ronnald Nian, MD  sertraline (ZOLOFT) 100 MG tablet Take 1 tablet (100 mg total) by mouth daily. 04/05/23         Allergies    Reglan [metoclopramide]    Review of Systems   Review of Systems  Gastrointestinal:  Positive for abdominal pain and vomiting.    Physical Exam Updated Vital Signs BP (!) 140/65 (BP Location: Left Arm)   Pulse 85   Temp 98 F (36.7 C) (Oral)   Resp 19   Ht 1.651 m (5\' 5" )   Wt 56.7 kg  SpO2 97%   BMI 20.80 kg/m  Physical Exam Vitals and nursing note reviewed.  Constitutional:      General: She is in acute distress.     Appearance: She is well-developed. She is ill-appearing.  HENT:     Head: Normocephalic and atraumatic.     Right Ear: External ear normal.     Left Ear: External ear normal.  Eyes:     General: No scleral icterus.       Right eye: No discharge.        Left eye: No discharge.     Conjunctiva/sclera: Conjunctivae normal.  Neck:     Trachea: No tracheal deviation.  Cardiovascular:     Rate and Rhythm: Normal rate and regular rhythm.  Pulmonary:     Effort: Pulmonary effort is normal. No respiratory distress.     Breath sounds: Normal breath sounds. No stridor. No wheezing or rales.  Abdominal:     General: Bowel sounds are normal. There is no distension.     Palpations: Abdomen is soft.     Tenderness:  There is abdominal tenderness in the epigastric area. There is no guarding or rebound.     Hernia: No hernia is present.  Musculoskeletal:        General: No tenderness or deformity.     Cervical back: Neck supple.  Skin:    General: Skin is warm and dry.     Findings: No rash.  Neurological:     General: No focal deficit present.     Mental Status: She is alert.     Cranial Nerves: No cranial nerve deficit, dysarthria or facial asymmetry.     Sensory: No sensory deficit.     Motor: No abnormal muscle tone or seizure activity.     Coordination: Coordination normal.  Psychiatric:        Mood and Affect: Mood normal.     ED Results / Procedures / Treatments   Labs (all labs ordered are listed, but only abnormal results are displayed) Labs Reviewed  COMPREHENSIVE METABOLIC PANEL - Abnormal; Notable for the following components:      Result Value   Potassium 3.2 (*)    CO2 15 (*)    Glucose, Bld 192 (*)    AST 44 (*)    ALT 46 (*)    Anion gap 18 (*)    All other components within normal limits  CBC - Abnormal; Notable for the following components:   WBC 12.9 (*)    All other components within normal limits  URINALYSIS, ROUTINE W REFLEX MICROSCOPIC - Abnormal; Notable for the following components:   Color, Urine STRAW (*)    Ketones, ur 20 (*)    All other components within normal limits  BLOOD GAS, VENOUS - Abnormal; Notable for the following components:   pH, Ven 7.57 (*)    pCO2, Ven 18 (*)    pO2, Ven <31 (*)    Bicarbonate 16.5 (*)    Acid-base deficit 2.8 (*)    All other components within normal limits  BASIC METABOLIC PANEL - Abnormal; Notable for the following components:   Potassium 3.4 (*)    CO2 21 (*)    Glucose, Bld 132 (*)    Calcium 8.7 (*)    All other components within normal limits  LIPASE, BLOOD  TROPONIN I (HIGH SENSITIVITY)  TROPONIN I (HIGH SENSITIVITY)    EKG EKG Interpretation Date/Time:  Friday June 04 2023 10:56:08  EDT Ventricular Rate:  71 PR Interval:  147 QRS Duration:  106 QT Interval:  507 QTC Calculation: 552 R Axis:   -32  Text Interpretation: Sinus rhythm RSR' in V1 or V2, right VCD or RVH LVH with secondary repolarization abnormality Prolonged QT interval , increased since last tracing Confirmed by Linwood Dibbles [phone]) on 06/04/2023 10:59:16 AM  Radiology DG Abd Acute W/Chest  Result Date: 06/04/2023 CLINICAL DATA:  abd pain, vomiting EXAM: DG ABDOMEN ACUTE WITH 1 VIEW CHEST COMPARISON:  None Available. FINDINGS: Surgical clips in the right upper quadrant. There is no evidence of dilated bowel loops or free intraperitoneal air. No radiopaque calculi or other significant radiographic abnormality is seen. Heart size and mediastinal contours are within normal limits. Both lungs are clear. IMPRESSION: Negative abdominal radiographs.  No acute cardiopulmonary disease. Electronically Signed   By: Lorenza Cambridge M.D.   On: 06/04/2023 11:44    Procedures Procedures    Medications Ordered in ED Medications  sodium chloride 0.9 % bolus 1,000 mL (0 mLs Intravenous Stopped 06/04/23 1220)    And  0.9 %  sodium chloride infusion ( Intravenous New Bag/Given 06/04/23 1107)  HYDROmorphone (DILAUDID) injection 0.5 mg (0.5 mg Intravenous Given 06/04/23 1058)  ondansetron (ZOFRAN) injection 4 mg (4 mg Intravenous Given 06/04/23 1058)  pantoprazole (PROTONIX) injection 40 mg (40 mg Intravenous Given 06/04/23 1057)  LORazepam (ATIVAN) injection 1 mg (1 mg Intravenous Given 06/04/23 1221)    ED Course/ Medical Decision Making/ A&P Clinical Course as of 06/04/23 1433  Fri Jun 04, 2023  1046 Family states patient has a history of prolonged QT.  Will obtain baseline EKG.  Discussed all antiemetics can affect the QT but we will monitor closely [JK]  1131 Blood gas, venous (at [facility] and [facility])(!!) Venous pH elevated at 7.57 pCO2 decreased at 18 consistent with hyperventilation [JK]  1315 Metabolic panel does show decreased  bicarb with an elevated anion gap. [JK]  1316 X-ray does not show any acute abnormalities [JK]  1321 DG Abd Acute W/Chest No acute findings noted on aas [JK]  1329 Patient states she is feeling better at this time.  No longer hyperventilating.  Will recheck metabolic panel [JK]  1422 Basic metabolic panel(!) Repeat metabolic panel shows improved bicarb.  Anion gap reduced [JK]    Clinical Course User Index [JK] Linwood Dibbles, MD                                 Medical Decision Making Differential diagnosis includes but not limited to gastroparesis hepatitis, pancreatitis, bowel obstruction  Problems Addressed: Gastroparesis: acute illness or injury that poses a threat to life or bodily functions QT prolongation: undiagnosed new problem with uncertain prognosis  Amount and/or Complexity of Data Reviewed Labs: ordered. Decision-making details documented in ED Course. Radiology: ordered. Decision-making details documented in ED Course.  Risk Prescription drug management.   To the ED for evaluation of nausea vomiting.  Patient has history of gastroparesis.  On arrival patient appeared ill dehydrated.  She was treated with IV fluids and antiemetics.  Patient does have baseline QT prolongation.  1 dose of antiemetics was provided but fortunately no cardiac dysrhythmia and she tolerated this well.  Patient's venous blood gas showed a component of hyperventilation.  She was treated with a dose of Ativan for her nausea and her hyperventilation.  Patient improved with treatment in the ED.  She was treated with IV fluids.  She has  not had any recurrent vomiting.  Her abdominal discomfort has resolved.  I did check a repeat metabolic panel and her anion gap acidosis has resolved.  Suspect the symptoms are related to her gastroparesis.  She is feeling better and stable for discharge at this time       Final Clinical Impression(s) / ED Diagnoses Final diagnoses:  QT prolongation  Gastroparesis     Rx / DC Orders ED Discharge Orders     None         Linwood Dibbles, MD 06/04/23 1433

## 2023-06-04 NOTE — ED Notes (Signed)
Lab adding to previous lab

## 2023-06-04 NOTE — Discharge Instructions (Signed)
Continue your current medications.  Follow-up with your primary care doctor and GI doctor to be rechecked.

## 2023-06-08 ENCOUNTER — Other Ambulatory Visit (HOSPITAL_COMMUNITY): Payer: Self-pay

## 2023-06-08 DIAGNOSIS — G4700 Insomnia, unspecified: Secondary | ICD-10-CM | POA: Diagnosis not present

## 2023-06-08 DIAGNOSIS — F419 Anxiety disorder, unspecified: Secondary | ICD-10-CM | POA: Diagnosis not present

## 2023-06-08 DIAGNOSIS — F330 Major depressive disorder, recurrent, mild: Secondary | ICD-10-CM | POA: Diagnosis not present

## 2023-06-08 DIAGNOSIS — F902 Attention-deficit hyperactivity disorder, combined type: Secondary | ICD-10-CM | POA: Diagnosis not present

## 2023-06-08 MED ORDER — METHYLPHENIDATE HCL ER (OSM) 36 MG PO TBCR
36.0000 mg | EXTENDED_RELEASE_TABLET | Freq: Two times a day (BID) | ORAL | 0 refills | Status: AC
Start: 2023-06-08 — End: ?
  Filled 2023-06-17: qty 60, 30d supply, fill #0

## 2023-06-08 MED ORDER — METHYLPHENIDATE HCL ER (OSM) 36 MG PO TBCR
36.0000 mg | EXTENDED_RELEASE_TABLET | Freq: Two times a day (BID) | ORAL | 0 refills | Status: DC
  Filled 2023-07-29: qty 60, 30d supply, fill #0

## 2023-06-08 MED ORDER — METHYLPHENIDATE HCL ER (OSM) 36 MG PO TBCR: 36.0000 mg | EXTENDED_RELEASE_TABLET | Freq: Two times a day (BID) | ORAL | 0 refills | Status: DC

## 2023-06-09 ENCOUNTER — Telehealth: Payer: Self-pay | Admitting: Gastroenterology

## 2023-06-09 NOTE — Telephone Encounter (Signed)
Inbound call from patient requesting to transfer her care. Patient previously a patient with [facility]. States her doctor retired and was transferred over to [place] and she has also left so she has not had anymore to see in 10 months. Patient states she also would like to transfer due to wanting her care all under [facility]. Advised patient we would need previous records.

## 2023-06-10 ENCOUNTER — Other Ambulatory Visit (HOSPITAL_COMMUNITY): Payer: Self-pay

## 2023-06-11 ENCOUNTER — Encounter: Payer: Self-pay | Admitting: Cardiology

## 2023-06-14 NOTE — Telephone Encounter (Signed)
Patient scheduled for this week with NP for ED F/U

## 2023-06-17 ENCOUNTER — Ambulatory Visit: Payer: BC Managed Care – PPO | Attending: Nurse Practitioner | Admitting: Nurse Practitioner

## 2023-06-17 ENCOUNTER — Other Ambulatory Visit (HOSPITAL_COMMUNITY): Payer: Self-pay

## 2023-06-17 ENCOUNTER — Encounter: Payer: Self-pay | Admitting: Nurse Practitioner

## 2023-06-17 VITALS — BP 118/72 | HR 80 | Ht 65.0 in | Wt 134.0 lb

## 2023-06-17 DIAGNOSIS — R0789 Other chest pain: Secondary | ICD-10-CM | POA: Diagnosis not present

## 2023-06-17 DIAGNOSIS — E785 Hyperlipidemia, unspecified: Secondary | ICD-10-CM

## 2023-06-17 DIAGNOSIS — E039 Hypothyroidism, unspecified: Secondary | ICD-10-CM | POA: Diagnosis not present

## 2023-06-17 DIAGNOSIS — Z8673 Personal history of transient ischemic attack (TIA), and cerebral infarction without residual deficits: Secondary | ICD-10-CM

## 2023-06-17 DIAGNOSIS — I10 Essential (primary) hypertension: Secondary | ICD-10-CM | POA: Diagnosis not present

## 2023-06-17 NOTE — Patient Instructions (Signed)
Medication Instructions:  Your physician recommends that you continue on your current medications as directed. Please refer to the Current Medication list given to you today.  *If you need a refill on your cardiac medications before your next appointment, please call your pharmacy*   Lab Work: Fasting lipid panel, cmet, tsh at your convenience.   Testing/Procedures: NONE ordered at this time of appointment     Follow-Up: At [facility], you and your health needs are our priority.  As part of our continuing mission to provide you with exceptional heart care, we have created designated Provider Care Teams.  These Care Teams include your primary Cardiologist (physician) and Advanced Practice Providers (APPs -  Physician Assistants and Nurse Practitioners) who all work together to provide you with the care you need, when you need it.  We recommend signing up for the patient portal called "MyChart".  Sign up information is provided on this After Visit Summary.  MyChart is used to connect with patients for Virtual Visits (Telemedicine).  Patients are able to view lab/test results, encounter notes, upcoming appointments, etc.  Non-urgent messages can be sent to your provider as well.   To learn more about what you can do with MyChart, go to [email or web address].    Your next appointment:   6 month(s)  Provider:   Olga Millers, MD     Other Instructions

## 2023-06-17 NOTE — Progress Notes (Signed)
Office Visit    Patient Name: Dorothy Wood Date of Encounter: 06/17/2023  Primary Care Provider:  Ronnald Nian, MD Primary Cardiologist:  Olga Millers, MD  Chief Complaint    62 year old female with a family history of coronary artery disease (coronary calcium score of 0), TIA, hypertension, hyperlipidemia, hypothyroidism, Sjogren's syndrome,  and ovarian cancer who presents for follow-up related to hypertension.  Past Medical History    Past Medical History:  Diagnosis Date   Arthritis    Cancer (HCC)    OVARIAN   Colonic polyp    Depression    Gastritis    Hyperlipidemia    Hypertension    Hypothyroidism    Sjogren's disease (HCC)    Stroke (HCC)    Thyroid disease    HYPOTHYROIDISM   TIA (transient ischemic attack) 04/06/2021   Past Surgical History:  Procedure Laterality Date   ABDOMINAL HYSTERECTOMY     Ovarian cancer   BIOPSY  02/04/2021   Procedure: BIOPSY;  Surgeon: Bernette Redbird, MD;  Location: [facility];  Service: Endoscopy;;   BREAST CYST ASPIRATION     CESAREAN SECTION     x2   CHOLECYSTECTOMY N/A 01/23/2022   Procedure: LAPAROSCOPIC CHOLECYSTECTOMY WITH INTRAOPERATIVE CHOLANGIOGRAM;  Surgeon: Darnell Level, MD;  Location: [facility];  Service: General;  Laterality: N/A;   COLONOSCOPY  10-06   Dr. Louie Bun   ESOPHAGOGASTRODUODENOSCOPY N/A 02/04/2021   Procedure: ESOPHAGOGASTRODUODENOSCOPY (EGD);  Surgeon: Bernette Redbird, MD;  Location: [facility];  Service: Endoscopy;  Laterality: N/A;   TONSILLECTOMY      Allergies  Allergies  Allergen Reactions   Reglan [Metoclopramide] Shortness Of Breath     Labs/Other Studies Reviewed    The following studies were reviewed today:  Cardiac Studies & Procedures       ECHOCARDIOGRAM  ECHOCARDIOGRAM COMPLETE BUBBLE STUDY 04/07/2021  Narrative ECHOCARDIOGRAM REPORT    Patient Name:   Dorothy Wood Date of Exam: 04/07/2021 Medical Rec #:  [id number]        Height:       65.0 in Accession  #:    [id number]       Weight:       133.2 lb Date of Birth:  November 21, 1960         BSA:          1.664 m Patient Age:    60 years         BP:           111/59 mmHg Patient Gender: F                HR:           77 bpm. Exam Location:  Inpatient  Procedure: 2D Echo, Cardiac Doppler, Color Doppler and Saline Contrast Bubble Study  Indications:    TIA G45.9  History:        Patient has prior history of Echocardiogram examinations, most recent 04/30/2020. Risk Factors:Hypertension and Dyslipidemia. Cancer. Thyroid Disease.  Sonographer:    Elmarie Shiley Dance Referring Phys: [id number] ANASTASSIA DOUTOVA  IMPRESSIONS   1. Left ventricular ejection fraction, by estimation, is 55 to 60%. The left ventricle has normal function. The left ventricle has no regional wall motion abnormalities. Left ventricular diastolic parameters were normal. 2. Right ventricular systolic function is normal. The right ventricular size is normal. Tricuspid regurgitation signal is inadequate for assessing PA pressure. 3. The mitral valve is normal in structure. No evidence of mitral valve regurgitation. No evidence of mitral  stenosis. 4. The aortic valve is tricuspid. Aortic valve regurgitation is not visualized. No aortic stenosis is present. 5. The inferior vena cava is normal in size with greater than 50% respiratory variability, suggesting right atrial pressure of 3 mmHg.  FINDINGS Left Ventricle: Left ventricular ejection fraction, by estimation, is 55 to 60%. The left ventricle has normal function. The left ventricle has no regional wall motion abnormalities. The left ventricular internal cavity size was normal in size. There is no left ventricular hypertrophy. Left ventricular diastolic parameters were normal.  Right Ventricle: The right ventricular size is normal. No increase in right ventricular wall thickness. Right ventricular systolic function is normal. Tricuspid regurgitation signal is inadequate for assessing PA  pressure.  Left Atrium: Left atrial size was normal in size.  Right Atrium: Right atrial size was normal in size.  Pericardium: There is no evidence of pericardial effusion.  Mitral Valve: The mitral valve is normal in structure. No evidence of mitral valve regurgitation. No evidence of mitral valve stenosis.  Tricuspid Valve: The tricuspid valve is normal in structure. Tricuspid valve regurgitation is not demonstrated.  Aortic Valve: The aortic valve is tricuspid. Aortic valve regurgitation is not visualized. No aortic stenosis is present.  Pulmonic Valve: The pulmonic valve was normal in structure. Pulmonic valve regurgitation is not visualized.  Aorta: The aortic root is normal in size and structure.  Venous: The inferior vena cava is normal in size with greater than 50% respiratory variability, suggesting right atrial pressure of 3 mmHg.  IAS/Shunts: No atrial level shunt detected by color flow Doppler. Agitated saline contrast was given intravenously to evaluate for intracardiac shunting.   LEFT VENTRICLE PLAX 2D LVIDd:         3.90 cm  Diastology LVIDs:         2.60 cm  LV e' medial:    11.00 cm/s LV PW:         0.80 cm  LV E/e' medial:  7.5 LV IVS:        0.90 cm  LV e' lateral:   12.80 cm/s LVOT diam:     1.60 cm  LV E/e' lateral: 6.5 LV SV:         39 LV SV Index:   23 LVOT Area:     2.01 cm   RIGHT VENTRICLE            IVC RV Basal diam:  2.40 cm    IVC diam: 1.50 cm RV S prime:     9.57 cm/s TAPSE (M-mode): 1.8 cm  LEFT ATRIUM             Index       RIGHT ATRIUM          Index LA diam:        3.00 cm 1.80 cm/m  RA Area:     8.71 cm LA Vol (A2C):   22.2 ml 13.34 ml/m RA Volume:   16.00 ml 9.61 ml/m LA Vol (A4C):   15.2 ml 9.13 ml/m LA Biplane Vol: 18.8 ml 11.30 ml/m AORTIC VALVE LVOT Vmax:   85.00 cm/s LVOT Vmean:  61.500 cm/s LVOT VTI:    0.192 m  AORTA Ao Root diam: 3.30 cm Ao Asc diam:  3.10 cm  MITRAL VALVE MV Area (PHT): 3.65 cm     SHUNTS MV Decel Time: 208 msec    Systemic VTI:  0.19 m MV E velocity: 82.60 cm/s  Systemic Diam: 1.60 cm MV A velocity: 71.70 cm/s  MV E/A ratio:  1.15  Marca Ancona MD Electronically signed by Marca Ancona MD Signature Date/Time: 04/07/2021/4:47:48 PM    Final     CT SCANS  CT CARDIAC SCORING (SELF PAY ONLY) 10/27/2019  Addendum 10/27/2019  9:06 PM ADDENDUM REPORT: 10/27/2019 21:04  CLINICAL DATA:  Risk stratification  EXAM: Coronary Calcium Score  TECHNIQUE: The patient was scanned on a [employer] scanner. Axial non-contrast 3 mm slices were carried out through the heart. The data set was analyzed on a dedicated work station and scored using the Agatson method.  FINDINGS: Non-cardiac: See separate report from [facility].  Ascending Aorta: Normal size  Pericardium: Normal  Coronary arteries: Calcium score 0  IMPRESSION: Coronary calcium score of 0.   Electronically Signed By: Epifanio Lesches MD On: 10/27/2019 21:04  Narrative EXAM: OVER-READ INTERPRETATION  CT CHEST  The following report is an over-read performed by radiologist Dr. Richarda Overlie of [facility] on 10/27/2019. This over-read does not include interpretation of cardiac or coronary anatomy or pathology. The coronary calcium score/coronary CTA interpretation by the cardiologist is attached.  COMPARISON:  None.  FINDINGS: Vascular: Ascending thoracic aorta measures 3.3 cm. Descending thoracic aorta measures 2.3 cm. Heart size is normal. Minimal pericardial fluid.  Mediastinum/Nodes: Visualized mediastinal structures are unremarkable.  Lungs/Pleura: Small peripheral or pleural based density in the posterior right lower lobe measures 3 mm on sequence 3, image 40. No large pleural effusions. Mild pleural thickening along the anterior left upper lobe on sequence 3, image 10. Small amount of focal pleural-based thickening near the anterior aspect of the right  minor fissure on sequence 3, image 12. Slightly prominent interstitial densities in the medial left upper lobe on sequence 3, image 8. Mild pleural thickening in left lower lobe near the left major fissure on sequence 3, image 25.  Upper Abdomen: Images of the upper abdomen are unremarkable.  Musculoskeletal: No acute bone abnormality.  IMPRESSION: Scattered areas of pleural thickening and questionable scarring in the lungs. Findings are nonspecific. There is at least 1 peripheral or pleural based nodule measuring up to 3 mm that is indeterminate. No follow-up needed if patient is low-risk. Non-contrast chest CT can be considered in 12 months if patient is high-risk. This recommendation follows the consensus statement: Guidelines for Management of Incidental Pulmonary Nodules Detected on CT Images: From the Fleischner Society 2017; Radiology 2017; 284:228-243.  Electronically Signed: By: Richarda Overlie M.D. On: 10/27/2019 16:10         Recent Labs: 07/22/2022: Magnesium 1.7 06/04/2023: ALT 46; BUN 10; Creatinine, Ser 0.72; Hemoglobin 14.0; Platelets 308; Potassium 3.4; Sodium 140  Recent Lipid Panel    Component Value Date/Time   CHOL 180 04/29/2022 0839   TRIG 116 04/29/2022 0839   HDL 75 04/29/2022 0839   CHOLHDL 2.4 04/29/2022 0839   CHOLHDL 2.8 04/07/2021 0255   VLDL 39 04/07/2021 0255   LDLCALC 85 04/29/2022 0839   LDLDIRECT 122.2 (H) 04/06/2021 1909    History of Present Illness    62 year old female with the above past medical history including family history of coronary artery disease (coronary calcium score of 0), TIA, hypertension, hyperlipidemia, hypothyroidism, Sjogren's syndrome, and ovarian cancer.  Calcium score in 2021 was 0.  She has a reported history of TIA.  Brain MRI/MRI in August 2021 was normal.  Echocardiogram in July 2022 showed normal LV function.  Carotid Dopplers in August 2023 were essentially normal.  She was last seen in the office on 01/14/2023 and  was stable from a cardiac standpoint.  She was appropriately grieving the loss of her husband who had recently died of pancreatic cancer.  She presented to the ED on 06/04/2023 in the setting of nausea, vomiting, and abdominal pain.  She also noted  a burning in her chest.  Troponin was negative.  It was felt that her symptoms were likely secondary to gastroparesis.  She presents today for follow-up.  Since her last visit and since her recent ED visit She has done well from a cardiac standpoint.  She denies any further chest pain, denied palpitations, dyspnea, edema, PND, orthopnea, weight gain. BP has been well-controlled.  She is certain that her recent chest discomfort was related to gastroparesis in the setting of dietary indiscretion.  Her symptoms have resolved with improved diet.  Overall, she reports feeling well.    Home Medications    Current Outpatient Medications  Medication Sig Dispense Refill   aspirin EC 81 MG tablet Take 81 mg by mouth every morning. Swallow whole.     atorvastatin (LIPITOR) 80 MG tablet Take 1 tablet (80 mg total) by mouth daily. 90 tablet 0   buPROPion (WELLBUTRIN XL) 150 MG 24 hr tablet Take 1 tablet (150 mg total) by mouth every morning. 90 tablet 1   dexmethylphenidate (FOCALIN XR) 20 MG 24 hr capsule Take 1 oral capsule once a day 30 capsule 0   famotidine (PEPCID) 40 MG tablet Take 1 tablet (40 mg total) by mouth daily. 90 tablet 1   guanFACINE (TENEX) 2 MG tablet Take 1 tablet (2 mg total) by mouth 2 (two) times daily in the morning and at bedtime. 180 tablet 1   levothyroxine (EUTHYROX) 88 MCG tablet Take 1 tablet (88 mcg total) by mouth daily. 90 tablet 3   Methylphenidate (COTEMPLA XR-ODT) 17.3 MG TBED Take 1 tablet (17.3 mg total) by mouth every morning. 05/03/23 30 tablet 0   [START ON 08/03/2023] methylphenidate 36 MG PO CR tablet Take 1 tablet (36 mg total) by mouth 2 (two) times daily, morning and afternoon. 60 tablet 0   [START ON 07/06/2023]  methylphenidate 36 MG PO CR tablet Take 1 tablet (36 mg total) by mouth 2 (two) times daily, morning and afternoon 60 tablet 0   pilocarpine (SALAGEN) 5 MG tablet Take 1 tablet (5 mg total) by mouth 4 (four) times daily. 360 tablet 1   pramipexole (MIRAPEX) 0.25 MG tablet Take 1 tablet (0.25 mg total) by mouth 3 (three) times daily. 90 tablet 0   propranolol ER (INDERAL LA) 60 MG 24 hr capsule Take 1 capsule (60 mg) by mouth in the morning. 30 capsule 1   sertraline (ZOLOFT) 100 MG tablet Take 1 tablet (100 mg total) by mouth daily. 90 tablet 1   buPROPion (WELLBUTRIN XL) 150 MG 24 hr tablet Take 1 tablet (150 mg total) by mouth every morning. 90 tablet 1   ezetimibe (ZETIA) 10 MG tablet Take 1 tablet by mouth daily. 90 tablet 3   Methylphenidate (COTEMPLA XR-ODT) 17.3 MG TBED Take 1 tablet (17.3 mg total) by mouth every morning. (Patient not taking: Reported on 06/17/2023) 30 tablet 0   No current facility-administered medications for this visit.     Review of Systems    She denies chest pain, palpitations, dyspnea, pnd, orthopnea, n, v, dizziness, syncope, edema, weight gain, or early satiety. All other systems reviewed and are otherwise negative except as noted above.   Physical Exam    VS:  BP 118/72 (BP  Location: Left Arm, Patient Position: Sitting, Cuff Size: Normal)   Pulse 80   Ht 5\' 5"  (1.651 m)   Wt 134 lb (60.8 kg)   SpO2 98%   BMI 22.30 kg/m   GEN: Well nourished, well developed, in no acute distress. HEENT: normal. Neck: Supple, no JVD, carotid bruits, or masses. Cardiac: RRR, no murmurs, rubs, or gallops. No clubbing, cyanosis, edema.  Radials/DP/PT 2+ and equal bilaterally.  Respiratory:  Respirations regular and unlabored, clear to auscultation bilaterally. GI: Soft, nontender, nondistended, BS + x 4. MS: no deformity or atrophy. Skin: warm and dry, no rash. Neuro:  Strength and sensation are intact. Psych: Normal affect.  Accessory Clinical Findings    ECG  personally reviewed by me today -    - no EKG in office today.    Lab Results  Component Value Date   WBC 12.9 (H) 06/04/2023   HGB 14.0 06/04/2023   HCT 41.7 06/04/2023   MCV 88.3 06/04/2023   PLT 308 06/04/2023   Lab Results  Component Value Date   CREATININE 0.72 06/04/2023   BUN 10 06/04/2023   NA 140 06/04/2023   K 3.4 (L) 06/04/2023   CL 109 06/04/2023   CO2 21 (L) 06/04/2023   Lab Results  Component Value Date   ALT 46 (H) 06/04/2023   AST 44 (H) 06/04/2023   ALKPHOS 104 06/04/2023   BILITOT 0.9 06/04/2023   Lab Results  Component Value Date   CHOL 180 04/29/2022   HDL 75 04/29/2022   LDLCALC 85 04/29/2022   LDLDIRECT 122.2 (H) 04/06/2021   TRIG 116 04/29/2022   CHOLHDL 2.4 04/29/2022    Lab Results  Component Value Date   HGBA1C 5.6 04/07/2021    Assessment & Plan    1. Chest pain: Prior coronary calcium score of 0. Recent chest discomfort that occurred in the setting of gastroparesis.  Recent ED workup was unremarkable.  She denies any further symptoms concerning for angina.  No indication for ischemic evaluation.  Work-up to date reassuring.   2. Hypertension: BP well controlled. Continue current antihypertensive regimen.   3. Hyperlipidemia: LDL was 85 in 04/2022.  She is overdue for follow-up with her PCP.  Will update fasting lipids, CMET (potassium was low during recent ED visit).  Continue Lipitor, Zetia.  4. History of TIA: Reported history of TIA.  MRI in 2021 was unremarkable.  No recurrence.  Continue current ASA, Lipitor, Zetia.  5. Hypothyroidism: TSH was 2.18 in 05/2022. Again, she is overdue for follow-up with her PCP.  Will update TSH.  Continue levothyroxine and follow-up with PCP.  6. Disposition: Follow-up in 6 months with Dr. Jens Som, sooner if needed.      Joylene Grapes, NP 06/17/2023, 7:10 PM

## 2023-06-18 ENCOUNTER — Other Ambulatory Visit (HOSPITAL_COMMUNITY): Payer: Self-pay

## 2023-06-18 ENCOUNTER — Emergency Department (HOSPITAL_COMMUNITY)
Admission: EM | Admit: 2023-06-18 | Discharge: 2023-06-18 | Disposition: A | Discharge status: Home / Self Care | Payer: BC Managed Care – PPO | Attending: Emergency Medicine | Admitting: Emergency Medicine

## 2023-06-18 ENCOUNTER — Other Ambulatory Visit: Payer: Self-pay

## 2023-06-18 ENCOUNTER — Encounter (HOSPITAL_COMMUNITY): Payer: Self-pay | Admitting: Emergency Medicine

## 2023-06-18 DIAGNOSIS — I10 Essential (primary) hypertension: Secondary | ICD-10-CM | POA: Diagnosis not present

## 2023-06-18 DIAGNOSIS — R1084 Generalized abdominal pain: Secondary | ICD-10-CM | POA: Diagnosis not present

## 2023-06-18 DIAGNOSIS — R109 Unspecified abdominal pain: Secondary | ICD-10-CM | POA: Diagnosis not present

## 2023-06-18 DIAGNOSIS — Z7982 Long term (current) use of aspirin: Secondary | ICD-10-CM | POA: Diagnosis not present

## 2023-06-18 DIAGNOSIS — R252 Cramp and spasm: Secondary | ICD-10-CM | POA: Diagnosis not present

## 2023-06-18 DIAGNOSIS — R112 Nausea with vomiting, unspecified: Secondary | ICD-10-CM | POA: Diagnosis not present

## 2023-06-18 LAB — COMPREHENSIVE METABOLIC PANEL
ALT: 24 U/L (ref 0–44)
AST: 30 U/L (ref 15–41)
Albumin: 4.6 g/dL (ref 3.5–5.0)
Alkaline Phosphatase: 86 U/L (ref 38–126)
Anion gap: 17 — ABNORMAL HIGH (ref 5–15)
BUN: 8 mg/dL (ref 8–23)
CO2: 20 mmol/L — ABNORMAL LOW (ref 22–32)
Calcium: 10.1 mg/dL (ref 8.9–10.3)
Chloride: 105 mmol/L (ref 98–111)
Creatinine, Ser: 0.82 mg/dL (ref 0.44–1.00)
GFR, Estimated: >60 mL/min (ref >60)
Glucose, Bld: 149 mg/dL — ABNORMAL HIGH (ref 70–99)
Potassium: 3.7 mmol/L (ref 3.5–5.1)
Sodium: 142 mmol/L (ref 135–145)
Total Bilirubin: 0.8 mg/dL (ref 0.3–1.2)
Total Protein: 7.6 g/dL (ref 6.5–8.1)

## 2023-06-18 LAB — CBC
HCT: 42.1 % (ref 36.0–46.0)
Hemoglobin: 14.1 g/dL (ref 12.0–15.0)
MCH: 29.5 pg (ref 26.0–34.0)
MCHC: 33.5 g/dL (ref 30.0–36.0)
MCV: 88.1 fL (ref 80.0–100.0)
Platelets: 305 10*3/uL (ref 150–400)
RBC: 4.78 MIL/uL (ref 3.87–5.11)
RDW: 12.6 % (ref 11.5–15.5)
WBC: 14.7 10*3/uL — ABNORMAL HIGH (ref 4.0–10.5)
nRBC: 0 % (ref 0.0–0.2)

## 2023-06-18 LAB — MAGNESIUM: Magnesium: 1.7 mg/dL (ref 1.7–2.4)

## 2023-06-18 LAB — URINALYSIS, ROUTINE W REFLEX MICROSCOPIC
Bilirubin Urine: NEGATIVE
Glucose, UA: NEGATIVE mg/dL
Hgb urine dipstick: NEGATIVE
Ketones, ur: 5 mg/dL — AB
Leukocytes,Ua: NEGATIVE
Nitrite: NEGATIVE
Protein, ur: NEGATIVE mg/dL
Specific Gravity, Urine: 1.009 (ref 1.005–1.030)
pH: 8 (ref 5.0–8.0)

## 2023-06-18 LAB — LIPASE, BLOOD: Lipase: 30 U/L (ref 11–51)

## 2023-06-18 MED ORDER — HYDROMORPHONE HCL 1 MG/ML IJ SOLN
0.5000 mg | Freq: Once | INTRAMUSCULAR | Status: AC
  Administered 2023-06-18: 0.5 mg via INTRAVENOUS
  Filled 2023-06-18: qty 1

## 2023-06-18 MED ORDER — SODIUM CHLORIDE 0.9 % IV BOLUS
1000.0000 mL | Freq: Once | INTRAVENOUS | Status: AC
  Administered 2023-06-18: 1000 mL via INTRAVENOUS

## 2023-06-18 MED ORDER — ONDANSETRON HCL 4 MG/2ML IJ SOLN
4.0000 mg | Freq: Once | INTRAMUSCULAR | Status: AC
  Administered 2023-06-18: 4 mg via INTRAVENOUS
  Filled 2023-06-18: qty 2

## 2023-06-18 MED ORDER — METHYLPHENIDATE HCL ER (OSM) 36 MG PO TBCR
36.0000 mg | EXTENDED_RELEASE_TABLET | Freq: Two times a day (BID) | ORAL | 0 refills | Status: DC
  Filled 2023-06-18 – 2023-06-23 (×3): qty 60, 30d supply, fill #0

## 2023-06-18 MED ORDER — LORAZEPAM 2 MG/ML IJ SOLN
1.0000 mg | Freq: Once | INTRAMUSCULAR | Status: AC
  Administered 2023-06-18: 1 mg via INTRAVENOUS
  Filled 2023-06-18: qty 1

## 2023-06-18 NOTE — Discharge Instructions (Addendum)
Please read and follow all provided instructions.  Your diagnoses today include:  1. Generalized abdominal pain   2. Nausea and vomiting, unspecified vomiting type     Tests performed today include: Blood cell counts and platelets: white blood cell count was high again Kidney and liver function tests: kidney function test was normal, potassium and magnesium were normal Pancreas function test (called lipase) Urine test to look for infection Vital signs. See below for your results today.   Medications prescribed:  None  Take any prescribed medications only as directed.  Home care instructions:  Follow any educational materials contained in this packet.  Follow-up instructions: Please follow-up with your primary care provider in the next 3 days for further evaluation of your symptoms.  Please follow-up with gastroenterology when able  Return instructions:  SEEK IMMEDIATE MEDICAL ATTENTION IF: The pain does not go away or becomes severe  A temperature above 101F develops  Repeated vomiting occurs (multiple episodes)  The pain becomes localized to portions of the abdomen. The right side could possibly be appendicitis. In an adult, the left lower portion of the abdomen could be colitis or diverticulitis.  Blood is being passed in stools or vomit (bright red or black tarry stools)  You develop chest pain, difficulty breathing, dizziness or fainting, or become confused, poorly responsive, or inconsolable (young children) If you have any other emergent concerns regarding your health  Additional Information: Abdominal (belly) pain can be caused by many things. Your caregiver performed an examination and possibly ordered blood/urine tests and imaging (CT scan, x-rays, ultrasound). Many cases can be observed and treated at home after initial evaluation in the emergency department. Even though you are being discharged home, abdominal pain can be unpredictable. Therefore, you need a repeated  exam if your pain does not resolve, returns, or worsens. Most patients with abdominal pain don't have to be admitted to the hospital or have surgery, but serious problems like appendicitis and gallbladder attacks can start out as nonspecific pain. Many abdominal conditions cannot be diagnosed in one visit, so follow-up evaluations are very important.  Your vital signs today were: BP (!) 167/89 (BP Location: Right Arm)   Pulse 83   Temp 98.6 F (37 C) (Oral)   Resp 20   Ht 5\' 5"  (1.651 m)   Wt 60.8 kg   SpO2 95%   BMI 22.30 kg/m  If your blood pressure (bp) was elevated above 135/85 this visit, please have this repeated by your doctor within one month. --------------

## 2023-06-18 NOTE — ED Provider Notes (Cosign Needed Addendum)
[facility]MENT AT [facility] Provider Note   CSN: [id number] Arrival date & time: 06/18/23  1132     History  Chief Complaint  Patient presents with   Abdominal Pain   Emesis    Dorothy Wood is a 62 y.o. female.  Patient with history of gastroparesis, history of cholecystectomy, hysterectomy and cesarean sections --presents to the emergency department for acute onset of abdominal pain, lower extremity cramps, vomiting.  Vomiting has been bilious per family report.  She has generalized cramping in her stomach.  She feels like it is difficult for her to move her legs and arms due to severe cramps.  No known triggers.  Patient was seen in the emergency department about 2 weeks ago for similar symptoms, treated and released.  Patient took a Zofran last night per family member report.  No other treatments today.  Patient states that she was due to have her labs redrawn today.  No urinary symptoms.  No chest pains or shortness of breath. Labs on 06/04/2023, blood gas consistent with a low bicarb at 15 with an anion gap of 18, venous blood gas respiratory alkalosis, mildly elevated transaminases, slightly low potassium at 3.2     Home Medications Prior to Admission medications   Medication Sig Start Date End Date Taking? Authorizing Provider  aspirin EC 81 MG tablet Take 81 mg by mouth every morning. Swallow whole.    [provider]  atorvastatin (LIPITOR) 80 MG tablet Take 1 tablet (80 mg total) by mouth daily. 05/18/23   Ronnald Nian, MD  buPROPion (WELLBUTRIN XL) 150 MG 24 hr tablet Take 1 tablet (150 mg total) by mouth every morning. 11/12/22     buPROPion (WELLBUTRIN XL) 150 MG 24 hr tablet Take 1 tablet (150 mg total) by mouth every morning. 04/05/23     dexmethylphenidate (FOCALIN XR) 20 MG 24 hr capsule Take 1 oral capsule once a day 01/27/23     ezetimibe (ZETIA) 10 MG tablet Take 1 tablet by mouth daily. 05/01/22 06/08/23  Alver Sorrow, NP   famotidine (PEPCID) 40 MG tablet Take 1 tablet (40 mg total) by mouth daily. 03/11/23   Tysinger, Kermit Balo, PA-C  guanFACINE (TENEX) 2 MG tablet Take 1 tablet (2 mg total) by mouth 2 (two) times daily in the morning and at bedtime. 07/27/22     levothyroxine (EUTHYROX) 88 MCG tablet Take 1 tablet (88 mcg total) by mouth daily. 02/22/23     Methylphenidate (COTEMPLA XR-ODT) 17.3 MG TBED Take 1 tablet (17.3 mg total) by mouth every morning. Patient not taking: Reported on 06/17/2023 04/05/23     Methylphenidate (COTEMPLA XR-ODT) 17.3 MG TBED Take 1 tablet (17.3 mg total) by mouth every morning. 05/03/23 05/03/23     methylphenidate 36 MG PO CR tablet Take 1 tablet (36 mg total) by mouth 2 (two) times daily, morning and afternoon. 08/03/23     methylphenidate 36 MG PO CR tablet Take 1 tablet (36 mg total) by mouth 2 (two) times daily, morning and afternoon 07/06/23     pilocarpine (SALAGEN) 5 MG tablet Take 1 tablet (5 mg total) by mouth 4 (four) times daily. 03/02/23 03/01/24  Ronnald Nian, MD  pramipexole (MIRAPEX) 0.25 MG tablet Take 1 tablet (0.25 mg total) by mouth 3 (three) times daily. 05/24/23   Ronnald Nian, MD  propranolol ER (INDERAL LA) 60 MG 24 hr capsule Take 1 capsule (60 mg) by mouth in the morning. 03/29/23  Ronnald Nian, MD  sertraline (ZOLOFT) 100 MG tablet Take 1 tablet (100 mg total) by mouth daily. 04/05/23         Allergies    Reglan [metoclopramide]    Review of Systems   Review of Systems  Physical Exam Updated Vital Signs BP (!) 181/84 (BP Location: Left Arm)   Pulse 91   Temp 98.3 F (36.8 C) (Oral)   Resp 18   SpO2 97%  Physical Exam Vitals and nursing note reviewed.  Constitutional:      General: She is in acute distress (Appears uncomfortable, hyperventilating).     Appearance: She is well-developed.  HENT:     Head: Normocephalic and atraumatic.     Right Ear: External ear normal.     Left Ear: External ear normal.     Nose: Nose normal.  Eyes:      Conjunctiva/sclera: Conjunctivae normal.  Cardiovascular:     Rate and Rhythm: Normal rate and regular rhythm.     Heart sounds: No murmur heard. Pulmonary:     Effort: No respiratory distress.     Breath sounds: No wheezing, rhonchi or rales.     Comments: Patient hyperventilating Abdominal:     Palpations: Abdomen is soft.     Tenderness: There is abdominal tenderness. There is no guarding or rebound.     Comments: Mild generalized tenderness without rebound or guarding  Musculoskeletal:     Cervical back: Normal range of motion and neck supple.     Right lower leg: No edema.     Left lower leg: No edema.  Skin:    General: Skin is warm and dry.     Findings: No rash.  Neurological:     General: No focal deficit present.     Mental Status: She is alert. Mental status is at baseline.     Motor: No weakness.  Psychiatric:        Mood and Affect: Mood normal.    ED Results / Procedures / Treatments   Labs (all labs ordered are listed, but only abnormal results are displayed) Labs Reviewed  COMPREHENSIVE METABOLIC PANEL - Abnormal; Notable for the following components:      Result Value   CO2 20 (*)    Glucose, Bld 149 (*)    Anion gap 17 (*)    All other components within normal limits  CBC - Abnormal; Notable for the following components:   WBC 14.7 (*)    All other components within normal limits  URINALYSIS, ROUTINE W REFLEX MICROSCOPIC - Abnormal; Notable for the following components:   Color, Urine STRAW (*)    Ketones, ur 5 (*)    All other components within normal limits  LIPASE, BLOOD  MAGNESIUM    EKG EKG Interpretation Date/Time:  Friday June 18 2023 12:14:55 EDT Ventricular Rate:  94 PR Interval:  144 QRS Duration:  90 QT Interval:  399 QTC Calculation: 499 R Axis:   -45  Text Interpretation: Sinus rhythm Probable left atrial enlargement Left anterior fascicular block Borderline prolonged QT interval Baseline wander in lead(s) III Confirmed by  Vivi Barrack [id number]) on 06/18/2023 2:19:34 PM  Radiology No results found.  Procedures Procedures    Medications Ordered in ED Medications  sodium chloride 0.9 % bolus 1,000 mL (1,000 mLs Intravenous New Bag/Given 06/18/23 1225)  HYDROmorphone (DILAUDID) injection 0.5 mg (0.5 mg Intravenous Given 06/18/23 1221)  LORazepam (ATIVAN) injection 1 mg (1 mg Intravenous Given 06/18/23 1217)  ondansetron (ZOFRAN)  injection 4 mg (4 mg Intravenous Given 06/18/23 1610)    ED Course/ Medical Decision Making/ A&P    Patient seen and examined. History obtained directly from patient.   Labs/EKG: Ordered CBC, CMP, lipase, UA, magnesium.  Imaging: None ordered  Medications/Fluids: Ordered: IV fluid bolus, 0.5 mg Dilaudid, 1 mg Ativan.   Most recent vital signs reviewed and are as follows: BP (!) 181/84 (BP Location: Left Arm)   Pulse 91   Temp 98.3 F (36.8 C) (Oral)   Resp 18   SpO2 97%   Initial impression: Vomiting and abdominal pain, history of gastroparesis, hyperventilation  1:20 PM Reassessment performed. Patient appears sleepy now, but much more comfortable.  Oxygen saturation 88-92% after being given medications.  Patient encouraged to continue deep breathing.  Will place on small amount of supplemental O2.  Will continue to monitor.  Will p.o. challenge.  Labs personally reviewed and interpreted including: CBC with elevated white blood cell count of 14.7, hemoglobin normal at 14.1 otherwise unremarkable; CMP with bicarb of 20, glucose 149 with mildly elevated anion gap at 17, normal liver function tests, normal electrolytes --these results are similar to presentation during previous ED visit 2 weeks ago; lipase normal at 30; magnesium normal.  Reviewed pertinent lab work and imaging with patient at bedside. Questions answered.   Most current vital signs reviewed and are as follows: BP (!) 163/70   Pulse 84   Temp 98.3 F (36.8 C) (Oral)   Resp 18   SpO2 100%   Plan: Patient  clinically improving with no focal tenderness.  Will continue to monitor.  Will p.o. challenge.  5:17 PM Reassessment performed. Patient appears stable on a couple of rechecks.  She was given a dose of IV Zofran and has not had any further vomiting.  She has kept down a few sips of fluid.  She still feels nauseous.  Offered continued fluids and monitoring versus discharge to home.  Patient states that she feels well enough to go home at this point.  Family member at bedside will help ensure that she gets her antiemetics.  Discussed clear liquid diet for the next 24 to 48 hours and then for progression slowly to bland diet after that time.  Reviewed pertinent lab work and imaging with patient at bedside. Questions answered.   Most current vital signs reviewed and are as follows: BP (!) 167/89 (BP Location: Right Arm)   Pulse 83   Temp 98.6 F (37 C) (Oral)   Resp 20   Ht 5\' 5"  (1.651 m)   Wt 60.8 kg   SpO2 95%   BMI 22.30 kg/m   Plan: Discharge to home.   Prescriptions written for: None  Other home care instructions discussed: Clear liquid diet, bland diet, scheduled use of antiemetics and PPI.  Patient currently taking Zofran twice a day due to history of prolonged QTc.  ED return instructions discussed: The patient was urged to return to the Emergency Department immediately with worsening of current symptoms, worsening abdominal pain, persistent vomiting, blood noted in stools, fever, or any other concerns. The patient verbalized understanding.   Follow-up instructions discussed: Patient encouraged to follow-up with their PCP in 3 days.                                 Medical Decision Making Amount and/or Complexity of Data Reviewed Labs: ordered.  Risk Prescription drug management.   For this  patient's complaint of abdominal pain, the following conditions were considered on the differential diagnosis: gastritis/PUD, enteritis/duodenitis, appendicitis,  cholelithiasis/cholecystitis, cholangitis, pancreatitis, ruptured viscus, colitis, diverticulitis, small/large bowel obstruction, proctitis, cystitis, pyelonephritis, ureteral colic, aortic dissection, aortic aneurysm. In women, ectopic pregnancy, pelvic inflammatory disease, ovarian cysts, and tubo-ovarian abscess were also considered. Atypical chest etiologies were also considered including ACS, PE, and pneumonia.  Symptoms today are consistent with her history of gastroparesis and consistent with lab workup 2 weeks ago.  No focal abdominal pain.  Imaging not performed today.  Clinically symptoms have improved with pain medication, Ativan, Zofran and IV fluids.  Patient would like to go home and try to manage her symptoms there like she did last time.  The patient's vital signs, pertinent lab work and imaging were reviewed and interpreted as discussed in the ED course. Hospitalization was considered for further testing, treatments, or serial exams/observation. However as patient is well-appearing, has a stable exam, and reassuring studies today, I do not feel that they warrant admission at this time. This plan was discussed with the patient who verbalizes agreement and comfort with this plan and seems reliable and able to return to the Emergency Department with worsening or changing symptoms.          Final Clinical Impression(s) / ED Diagnoses Final diagnoses:  Generalized abdominal pain  Nausea and vomiting, unspecified vomiting type    Rx / DC Orders ED Discharge Orders     None           Renne Crigler, PA-C 06/18/23 1805    Loetta Rough, MD 06/21/23 [phone]

## 2023-06-18 NOTE — ED Triage Notes (Signed)
Patient presents due to gastroparesis. She has been vomiting bile since last night. She was in the hospital about a week ago with this same issue.

## 2023-06-18 NOTE — ED Notes (Signed)
Gave pt ginger ale for PO attempt

## 2023-06-21 ENCOUNTER — Other Ambulatory Visit (HOSPITAL_COMMUNITY): Payer: Self-pay

## 2023-06-21 ENCOUNTER — Encounter: Payer: Self-pay | Admitting: Family Medicine

## 2023-06-21 ENCOUNTER — Ambulatory Visit: Payer: BC Managed Care – PPO | Admitting: Family Medicine

## 2023-06-21 VITALS — BP 150/100 | HR 88 | Temp 99.9°F | Ht 65.0 in | Wt 125.0 lb

## 2023-06-21 DIAGNOSIS — E039 Hypothyroidism, unspecified: Secondary | ICD-10-CM

## 2023-06-21 DIAGNOSIS — K219 Gastro-esophageal reflux disease without esophagitis: Secondary | ICD-10-CM

## 2023-06-21 DIAGNOSIS — R110 Nausea: Secondary | ICD-10-CM

## 2023-06-21 DIAGNOSIS — K3184 Gastroparesis: Secondary | ICD-10-CM

## 2023-06-21 DIAGNOSIS — R509 Fever, unspecified: Secondary | ICD-10-CM

## 2023-06-21 DIAGNOSIS — K5790 Diverticulosis of intestine, part unspecified, without perforation or abscess without bleeding: Secondary | ICD-10-CM | POA: Diagnosis not present

## 2023-06-21 DIAGNOSIS — D72829 Elevated white blood cell count, unspecified: Secondary | ICD-10-CM | POA: Diagnosis not present

## 2023-06-21 DIAGNOSIS — E785 Hyperlipidemia, unspecified: Secondary | ICD-10-CM

## 2023-06-21 LAB — CBC WITH DIFFERENTIAL/PLATELET
Basophils Absolute: 0 10*3/uL (ref 0.0–0.2)
Basos: 0 %
EOS (ABSOLUTE): 0 10*3/uL (ref 0.0–0.4)
Eos: 0 %
Hematocrit: 43.3 % (ref 34.0–46.6)
Hemoglobin: 14.8 g/dL (ref 11.1–15.9)
Lymphocytes Absolute: 2.4 10*3/uL (ref 0.7–3.1)
Lymphs: 28 %
MCH: 29.9 pg (ref 26.6–33.0)
MCHC: 34.2 g/dL (ref 31.5–35.7)
MCV: 88 fL (ref 79–97)
Monocytes Absolute: 1 10*3/uL — ABNORMAL HIGH (ref 0.1–0.9)
Monocytes: 12 %
Neutrophils Absolute: 5 10*3/uL (ref 1.4–7.0)
Neutrophils: 60 %
Platelets: 306 10*3/uL (ref 150–450)
RBC: 4.95 x10E6/uL (ref 3.77–5.28)
RDW: 13.6 % (ref 11.7–15.4)
WBC: 8.5 10*3/uL (ref 3.4–10.8)

## 2023-06-21 NOTE — Progress Notes (Signed)
Chief Complaint  Patient presents with   gastric paresis    Has been vomiting since Thursday night, better today. Still nauseated, has been passing some gas. Had a lfew sips of Sprite and half a cracker. Ate some vegetables that were not cooked enough.     Patient presents for ER follow-up. She was seen in the ER on 9/20 and 10/4, for issues related to gastroparesis. She is accompanied by her daughter today. She is noting some R ear discomfort, which started last night.  Denies other allergy or URI symptoms.  Today she reports she has been taking sips of fluids, starting to get hungry. Last emesis was last night around 6-7pm. Had BM in the hospital, reports it was yellow, like bile. No bowel movements since, but last night has noticed more bowel sounds, and has been passing gas. Having low grade fever since home from the hospital. No fever at the hospital.  Per 10/4 visit: went to ER for acute onset of abdominal pain/cramping, vomiting, lower extremity cramps, bilious vomiting. Vegetables with skin, if not cooked enough, tend to trigger her gastroparesis, and she recalls eating this prior to onset of pain/vomiting. BP was elevated, she was uncomfortable, in pain, hyperventilating. Treated with dilaudid, zofran, lorazepam and IV fluids. Labs notable for elevated WBC, normal lipase, Mg. No imaging was performed. She had normal acute abdominal series at ER visit 9/20. Patient was also seen in the emergency department on 9/20 for similar symptoms, treated and released.   Lab Results  Component Value Date   WBC 14.7 (H) 06/18/2023   HGB 14.1 06/18/2023   HCT 42.1 06/18/2023   MCV 88.1 06/18/2023   PLT 305 06/18/2023     Chemistry      Component Value Date/Time   NA 142 06/18/2023 1210   NA 139 07/30/2022 1553   K 3.7 06/18/2023 1210   CL 105 06/18/2023 1210   CO2 20 (L) 06/18/2023 1210   BUN 8 06/18/2023 1210   BUN 13 07/30/2022 1553   CREATININE 0.82 06/18/2023 1210   CREATININE  0.81 09/09/2016 1121      Component Value Date/Time   CALCIUM 10.1 06/18/2023 1210   ALKPHOS 86 06/18/2023 1210   AST 30 06/18/2023 1210   ALT 24 06/18/2023 1210   BILITOT 0.8 06/18/2023 1210   BILITOT 0.5 07/30/2022 1553      She has known history of gastroparesis.   Gastric emptying study done 08/2022: IMPRESSION: Scintigraphic findings compatible with delayed gastric emptying.  Abd CT done 07/2022: IMPRESSION: No acute abnormality. Wall thickening at the gastroesophageal junction suggesting reflux/esophagitis. Hepatic steatosis. Colonic diverticulosis without diverticulitis.  She is in the process of trying to switch her GI from [facility] to [facility], who needs prior records. The MD and mid-level that she had seen at [facility] both left.  She saw cardiology 10/3 (for f/u chest pain from ER visit).  Her CP had resolved, related to gastroparesis.  It was noted that she was past due to see her PCP, and they had ordered c-met (to f/u from ER), lipids and TSH. Last lipids were 04/2022, last TSH 05/2022.    PMH, PSH, SH reviewed  Outpatient Encounter Medications as of 06/21/2023  Medication Sig Note   aspirin EC 81 MG tablet Take 81 mg by mouth every morning. Swallow whole. (Patient not taking: Reported on 06/21/2023) 06/21/2023: Did not today, last dose Friday   atorvastatin (LIPITOR) 80 MG tablet Take 1 tablet (80 mg total) by mouth daily. (Patient not  taking: Reported on 06/21/2023) 06/21/2023: Did not today, last dose Friday    buPROPion (WELLBUTRIN XL) 150 MG 24 hr tablet Take 1 tablet (150 mg total) by mouth every morning. (Patient not taking: Reported on 06/21/2023) 06/21/2023: Did not today, last dose Friday    ezetimibe (ZETIA) 10 MG tablet Take 1 tablet by mouth daily. (Patient not taking: Reported on 06/21/2023) 06/21/2023: Did not today, last dose Friday    famotidine (PEPCID) 40 MG tablet Take 1 tablet (40 mg total) by mouth daily. (Patient not taking: Reported on 06/21/2023)  06/21/2023: Did not today, last dose Friday    guanFACINE (TENEX) 2 MG tablet Take 1 tablet (2 mg total) by mouth 2 (two) times daily in the morning and at bedtime. (Patient not taking: Reported on 06/21/2023) 06/21/2023: Did not today, last dose Friday    levothyroxine (EUTHYROX) 88 MCG tablet Take 1 tablet (88 mcg total) by mouth daily. (Patient not taking: Reported on 06/21/2023) 06/21/2023: Did not today, last dose Friday    [START ON 08/03/2023] methylphenidate 36 MG PO CR tablet Take 1 tablet (36 mg total) by mouth 2 (two) times daily, morning and afternoon. (Patient not taking: Reported on 06/21/2023) 06/21/2023: Did not today, last dose Friday    [START ON 07/06/2023] methylphenidate 36 MG PO CR tablet Take 1 tablet (36 mg total) by mouth 2 (two) times daily, morning and afternoon (Patient not taking: Reported on 06/21/2023) 06/21/2023: Did not today, last dose Friday    methylphenidate 36 MG PO CR tablet Take 1 tablet (36 mg total) by mouth 2 (two) times daily (morning and afternoon) (Patient not taking: Reported on 06/21/2023) 06/21/2023: Did not today, last dose Friday    pilocarpine (SALAGEN) 5 MG tablet Take 1 tablet (5 mg total) by mouth 4 (four) times daily. (Patient not taking: Reported on 06/21/2023) 06/21/2023: Did not today, last dose Friday    pramipexole (MIRAPEX) 0.25 MG tablet Take 1 tablet (0.25 mg total) by mouth 3 (three) times daily. (Patient not taking: Reported on 06/21/2023) 06/21/2023: Did not today, last dose Friday    propranolol ER (INDERAL LA) 60 MG 24 hr capsule Take 1 capsule (60 mg) by mouth in the morning. (Patient not taking: Reported on 06/21/2023) 06/21/2023: Did not today, last dose Friday    sertraline (ZOLOFT) 100 MG tablet Take 1 tablet (100 mg total) by mouth daily. (Patient not taking: Reported on 06/21/2023) 06/21/2023: Did not today, last dose Friday    [DISCONTINUED] buPROPion (WELLBUTRIN XL) 150 MG 24 hr tablet Take 1 tablet (150 mg total) by mouth every morning.     [DISCONTINUED] dexmethylphenidate (FOCALIN XR) 20 MG 24 hr capsule Take 1 oral capsule once a day (Patient not taking: Reported on 06/21/2023)    [DISCONTINUED] Methylphenidate (COTEMPLA XR-ODT) 17.3 MG TBED Take 1 tablet (17.3 mg total) by mouth every morning. (Patient not taking: Reported on 06/17/2023)    [DISCONTINUED] Methylphenidate (COTEMPLA XR-ODT) 17.3 MG TBED Take 1 tablet (17.3 mg total) by mouth every morning. 05/03/23    No facility-administered encounter medications on file as of 06/21/2023.   Allergies  Allergen Reactions   Reglan [Metoclopramide] Shortness Of Breath    ROS: LG fever per HPI.  Very slightly lightheaded. R ear discomfort today. No URI symptoms. No hematemesis, just bilious. Nausea improving. Currently denies abdominal pain. No bleeding, bruising, rashes. No headaches, chest pain, shortness of breath. Bloody nose 10/4 (vomiting had started the night before). Hasn't taken any of her meds since Friday.   PHYSICAL  EXAM:  BP (!) 150/100   Pulse 88   Temp 99.9 F (37.7 C) (Tympanic)   Ht 5\' 5"  (1.651 m)   Wt 125 lb (56.7 kg)   BMI 20.80 kg/m   Wt Readings from Last 3 Encounters:  06/21/23 125 lb (56.7 kg)  06/18/23 134 lb (60.8 kg)  06/17/23 134 lb (60.8 kg)   Daughter present-annoyed by patient moving her feet a lot while laying on exam table. Patient is well-appearing, nontoxic. She is alert and oriented.  Minimal hyperactivity (just moving feet position some while laying down). HEENT: conjunctiva and sclera are clear, EOMI. TM's and EAC's normal. OP clear. No nasal purulence or erythema, sinuses nontender.  Neck: no lymphadenopathy, thyromegaly or mass Heart: regular rate and rhythm Lungs: clear bilaterally Back: no spinal or CVA tenderness Abdomen: normal bowel sounds, soft, nontender, no organomegaly or mass Extremities: no edema Neuro: alert and oriented, cranial nerves grossly intact, normal gait Psych: normal mood, hygiene and  grooming    ASSESSMENT/PLAN:  Gastroparesis - Nausea/vomiting improved. To avoid triggering foods - Plan: CBC with Differential/Platelet  Leukocytosis, unspecified type - WBC high in ER, with LG fever today. No e/o infection noted on today's exam. Repeat CBC today - Plan: CBC with Differential/Platelet  Fever, unspecified fever cause - low grade, unclear etiology. no evidence of infection noted on exam today - Plan: CBC with Differential/Platelet  Diverticulosis - encouraged to follow high fiber diet.  Consider CT is increasing WBC count, though abdominal exam was benign today  Gastroesophageal reflux disease, unspecified whether esophagitis present - encouraged to restart famotidine,and follow reflux precautions/diet.  Nausea - improving.  N/V related to gastroparesis. She may also have nausea d/t not taking her routine meds x3d. Resume regular meds  Hypothyroidism, unspecified type - due for TSH. To schedule med check or CPE soon with Dr Linna Caprice  Hyperlipidemia, unspecified hyperlipidemia type - past due for labs and CPE/follow-up with PCP. She isn't fasting today. Will ensure she schedules fasting visit with PCP  To schedule CPE with [facility] (if not available in the near future, can have fasting med check sooner). Had elevated glucose in ER, reports she hadn't eaten, unsure if she had any sugar in IV fluids.  Consider A1c to screen for diabetes. Will  need c-met, lipids, TSH. Checking stat CBC today--if WBC higher, may need further eval of her n/v (consider CT abdomen).  Addendum: CBC improved/normal.  Lab Results  Component Value Date   WBC 8.5 06/21/2023   HGB 14.8 06/21/2023   HCT 43.3 06/21/2023   MCV 88 06/21/2023   PLT 306 06/21/2023    I spent 55 minutes dedicated to the care of this patient, including pre-visit review of records, face to face time, post-visit ordering of testing and documentation.    Please restart your medications. Propanolol will lower your  blood pressure.  Do not take any leftover hydrochlorothiazide that you may have at home. Be sure to restart your famotidine.

## 2023-06-21 NOTE — Patient Instructions (Signed)
  Please restart your medications. Propanolol will lower your blood pressure.  Do not take any leftover hydrochlorothiazide that you may have at home. Be sure to restart your famotidine.

## 2023-06-23 ENCOUNTER — Other Ambulatory Visit: Payer: Self-pay

## 2023-06-23 ENCOUNTER — Other Ambulatory Visit (HOSPITAL_COMMUNITY): Payer: Self-pay

## 2023-06-23 DIAGNOSIS — K2960 Other gastritis without bleeding: Secondary | ICD-10-CM | POA: Diagnosis not present

## 2023-06-23 DIAGNOSIS — K30 Functional dyspepsia: Secondary | ICD-10-CM | POA: Diagnosis not present

## 2023-06-23 MED ORDER — COLESTIPOL HCL 1 G PO TABS
2.0000 g | ORAL_TABLET | Freq: Every day | ORAL | 0 refills | Status: DC
  Filled 2023-06-23: qty 60, 30d supply, fill #0

## 2023-06-24 ENCOUNTER — Other Ambulatory Visit (HOSPITAL_COMMUNITY): Payer: Self-pay

## 2023-07-06 ENCOUNTER — Encounter: Payer: Self-pay | Admitting: Family Medicine

## 2023-07-06 ENCOUNTER — Ambulatory Visit: Payer: BC Managed Care – PPO | Admitting: Family Medicine

## 2023-07-06 VITALS — BP 110/70 | HR 61 | Ht 65.0 in | Wt 130.3 lb

## 2023-07-06 DIAGNOSIS — M3500 Sicca syndrome, unspecified: Secondary | ICD-10-CM

## 2023-07-06 DIAGNOSIS — I10 Essential (primary) hypertension: Secondary | ICD-10-CM

## 2023-07-06 DIAGNOSIS — R9431 Abnormal electrocardiogram [ECG] [EKG]: Secondary | ICD-10-CM | POA: Diagnosis not present

## 2023-07-06 DIAGNOSIS — K3184 Gastroparesis: Secondary | ICD-10-CM | POA: Insufficient documentation

## 2023-07-06 DIAGNOSIS — K2100 Gastro-esophageal reflux disease with esophagitis, without bleeding: Secondary | ICD-10-CM | POA: Diagnosis not present

## 2023-07-06 DIAGNOSIS — Z8543 Personal history of malignant neoplasm of ovary: Secondary | ICD-10-CM

## 2023-07-06 DIAGNOSIS — E782 Mixed hyperlipidemia: Secondary | ICD-10-CM

## 2023-07-06 DIAGNOSIS — G4733 Obstructive sleep apnea (adult) (pediatric): Secondary | ICD-10-CM

## 2023-07-06 DIAGNOSIS — R7302 Impaired glucose tolerance (oral): Secondary | ICD-10-CM

## 2023-07-06 DIAGNOSIS — K8020 Calculus of gallbladder without cholecystitis without obstruction: Secondary | ICD-10-CM

## 2023-07-06 DIAGNOSIS — M5412 Radiculopathy, cervical region: Secondary | ICD-10-CM

## 2023-07-06 DIAGNOSIS — F324 Major depressive disorder, single episode, in partial remission: Secondary | ICD-10-CM

## 2023-07-06 DIAGNOSIS — D369 Benign neoplasm, unspecified site: Secondary | ICD-10-CM

## 2023-07-06 DIAGNOSIS — F909 Attention-deficit hyperactivity disorder, unspecified type: Secondary | ICD-10-CM

## 2023-07-06 DIAGNOSIS — E038 Other specified hypothyroidism: Secondary | ICD-10-CM

## 2023-07-06 DIAGNOSIS — Z79890 Hormone replacement therapy (postmenopausal): Secondary | ICD-10-CM

## 2023-07-06 DIAGNOSIS — G459 Transient cerebral ischemic attack, unspecified: Secondary | ICD-10-CM

## 2023-07-06 DIAGNOSIS — F341 Dysthymic disorder: Secondary | ICD-10-CM

## 2023-07-06 DIAGNOSIS — Z8673 Personal history of transient ischemic attack (TIA), and cerebral infarction without residual deficits: Secondary | ICD-10-CM

## 2023-07-06 NOTE — Progress Notes (Signed)
Subjective:    Patient ID: [name], female    DOB: 05-08-1961, 62 y.o.   MRN: [id number]  HPI She is here for recheck.  She is being presently treated with Colestid to help with gastroparesis.  Apparently in the past she has tried Reglan with poor results due to side effects.  She also is scheduled to see cardiology because she has prolonged QT which certainly interferes with a lot of potential medications that could be used.  Otherwise she has no concerns or complaints.   Review of Systems     Objective:    Physical Exam  Alert and in no distress otherwise not examined      Assessment & Plan:   Problem List Items Addressed This Visit     Gastroparesis   GERD (gastroesophageal reflux disease) - Primary   Prolonged QT interval  Discussed the possibility of using erythromycin to help with her gastroparesis and she will discuss this with her gastroenterologist otherwise I will see her in a month or 2.

## 2023-07-08 DIAGNOSIS — E785 Hyperlipidemia, unspecified: Secondary | ICD-10-CM | POA: Diagnosis not present

## 2023-07-08 DIAGNOSIS — I10 Essential (primary) hypertension: Secondary | ICD-10-CM | POA: Diagnosis not present

## 2023-07-08 DIAGNOSIS — Z8673 Personal history of transient ischemic attack (TIA), and cerebral infarction without residual deficits: Secondary | ICD-10-CM | POA: Diagnosis not present

## 2023-07-08 DIAGNOSIS — R0789 Other chest pain: Secondary | ICD-10-CM | POA: Diagnosis not present

## 2023-07-08 DIAGNOSIS — E039 Hypothyroidism, unspecified: Secondary | ICD-10-CM | POA: Diagnosis not present

## 2023-07-08 LAB — COMPREHENSIVE METABOLIC PANEL
ALT: 16 [IU]/L (ref 0–32)
AST: 15 [IU]/L (ref 0–40)
Albumin: 4.4 g/dL (ref 3.9–4.9)
Alkaline Phosphatase: 101 [IU]/L (ref 44–121)
BUN/Creatinine Ratio: 9 — ABNORMAL LOW (ref 12–28)
BUN: 8 mg/dL (ref 8–27)
Bilirubin Total: 0.3 mg/dL (ref 0.0–1.2)
CO2: 27 mmol/L (ref 20–29)
Calcium: 10 mg/dL (ref 8.7–10.3)
Chloride: 100 mmol/L (ref 96–106)
Creatinine, Ser: 0.88 mg/dL (ref 0.57–1.00)
Globulin, Total: 2 g/dL (ref 1.5–4.5)
Glucose: 91 mg/dL (ref 70–99)
Potassium: 4.7 mmol/L (ref 3.5–5.2)
Sodium: 139 mmol/L (ref 134–144)
Total Protein: 6.4 g/dL (ref 6.0–8.5)
eGFR: 74 mL/min/{1.73_m2} (ref >59)

## 2023-07-08 LAB — LIPID PANEL
Chol/HDL Ratio: 2.3 {ratio} (ref 0.0–4.4)
Cholesterol, Total: 119 mg/dL (ref 100–199)
HDL: 52 mg/dL (ref >39)
LDL Chol Calc (NIH): 49 mg/dL (ref 0–99)
Triglycerides: 96 mg/dL (ref 0–149)
VLDL Cholesterol Cal: 18 mg/dL (ref 5–40)

## 2023-07-08 LAB — TSH: TSH: 4.1 u[IU]/mL (ref 0.450–4.500)

## 2023-07-09 ENCOUNTER — Encounter: Payer: Self-pay | Admitting: Neurology

## 2023-07-13 ENCOUNTER — Other Ambulatory Visit (HOSPITAL_COMMUNITY): Payer: Self-pay

## 2023-07-13 ENCOUNTER — Other Ambulatory Visit: Payer: Self-pay

## 2023-07-15 ENCOUNTER — Ambulatory Visit: Payer: BC Managed Care – PPO | Admitting: Neurology

## 2023-07-15 ENCOUNTER — Encounter: Payer: Self-pay | Admitting: Neurology

## 2023-07-15 VITALS — BP 134/93 | HR 68 | Ht 65.0 in | Wt 128.0 lb

## 2023-07-15 DIAGNOSIS — R41840 Attention and concentration deficit: Secondary | ICD-10-CM | POA: Diagnosis not present

## 2023-07-15 DIAGNOSIS — G250 Essential tremor: Secondary | ICD-10-CM

## 2023-07-15 NOTE — Patient Instructions (Signed)
I had a long discussion with the patient regarding her upper extremity action tremors which seem to have responded quite well to low-dose Inderal and I recommend she continue taking Inderal LA 60 mg daily.  She is also having some mild cognitive difficulties which are likely related to her attention deficit and I recommend she follow-up with her primary care physician for adjustment of her attention deficit medications.  She will return for follow-up with me in the future only as needed and no scheduled appointment was made.

## 2023-07-15 NOTE — Progress Notes (Signed)
[name] [address] [place]. [place] [address] [phone]       OFFICE FOLLOW-UP VISIT NOTE  Dorothy Wood Date of Birth:  1960/11/02 Medical Record Number:  [id number]   Referring MD:  Sharlot Gowda  Reason for Referral:  Tremors  HPI:  Initial visit 06/18/2022  Dorothy Wood is a pleasant 62 year old Caucasian lady seen today for office consultation visit for tremors.  She is accompanied by her daughter.  History is obtained from them and review of electronic medical records and I have personally reviewed pertinent available imaging films in PACS.  She has past medical history significant for hypertension, hyperlipidemia, depression, thyroid disease, TIA.  Patient states for the last 2 months she has noticed tremors involving both upper extremities.Symptoms mostly noticeable when she is using her hands.  Both hands are affected perhaps left more than right.  Patient states tremor started after she was started on Zoloft for depression.  She is going through a lot of stress as her husband was diagnosed with pancreatic cancer and she is taking care of him at home.  He does not drink alcohol.  He denies any drooling of saliva, stooped posture, difficulty with walking or balance.  He has history of TIA on 11/25/2020 he developed sudden onset of right arm and leg and facial heaviness and.  MRI scan of the brain and MRI of the brain unremarkable.  Echo with normal ejection fraction.  LDL cholesterol 87 mg percent.  Hemoglobin of 5.7.  Started on Lipitor and Zetia.  He states he has no further TIA or stroke.  He recently underwent lab work done by primary care physician and thyroid function test from 9 6/23 were normal/8/23 was normal.  LDL cholesterol on 04/29/2022 was 85 mg percent.  He denies any family history of tremors and has not noticed any effect of alcohol on her tremors. Update 07/15/2023 : She returns for follow-up after last visit a year ago.  She states she is  noting significant improvement in her upper extremity action tremor since starting Inderal.  She is taking 60 mg daily and related to her quite well.  She in fact manage to discontinue all other blood pressure medication.  She complains of mild cognitive difficulties mostly in the form of decreased attention and trouble with multitasking.  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  She does plan to discuss this with her primary physician.  On cognitive testing today she scored 30/30 on Mini-Mental and/floor on clock drawing and was able to name 12 animals which can walk on 4 legs. ROS:   14 system review of systems is positive for hand tremors, anxiety, stress, depression hand all other systems negative  PMH:  Past Medical History:  Diagnosis Date   Arthritis    Cancer (HCC)    OVARIAN   Colonic polyp    Depression    Gastritis    Hyperlipidemia    Hypertension    Hypothyroidism    Sjogren's disease (HCC)    Stroke (HCC)    Thyroid disease    HYPOTHYROIDISM   TIA (transient ischemic attack) 04/06/2021    Social History:  Social History   Socioeconomic History   Marital status: Widowed    Spouse name: Not on file   Number of children: 2   Years of education: Not on file  Highest education level: Not on file  Occupational History   Not on file  Tobacco Use   Smoking status: Never   Smokeless tobacco: Never  Vaping Use   Vaping status: Never Used  Substance and Sexual Activity   Alcohol use: Yes    Alcohol/week: 4.0 standard drinks of alcohol    Types: 4 Glasses of wine per week    Comment: 05/21/21 1 glasses wine 3 x week   Drug use: No   Sexual activity: Not Currently  Other Topics Concern   Not on file  Social History Narrative   Lives with spouse   Social Determinants of Health   Financial Resource Strain: Not on file  Food Insecurity: No  Food Insecurity (01/27/2022)   Hunger Vital Sign    Worried About Running Out of Food in the Last Year: Never true    Ran Out of Food in the Last Year: Never true  Transportation Needs: No Transportation Needs (01/27/2022)   PRAPARE - Administrator, Civil Service (Medical): No    Lack of Transportation (Non-Medical): No  Physical Activity: Not on file  Stress: Not on file  Social Connections: Not on file  Intimate Partner Violence: Not on file    Medications:   Current Outpatient Medications on File Prior to Visit  Medication Sig Dispense Refill   aspirin EC 81 MG tablet Take 81 mg by mouth every morning. Swallow whole.     atorvastatin (LIPITOR) 80 MG tablet Take 1 tablet (80 mg total) by mouth daily. 90 tablet 0   buPROPion (WELLBUTRIN XL) 150 MG 24 hr tablet Take 1 tablet (150 mg total) by mouth every morning. 90 tablet 1   colestipol (COLESTID) 1 g tablet Take 2 tablets (2 g total) by mouth daily. 60 tablet 0   ezetimibe (ZETIA) 10 MG tablet Take 1 tablet by mouth daily. 90 tablet 3   famotidine (PEPCID) 40 MG tablet Take 1 tablet (40 mg total) by mouth daily. 90 tablet 1   guanFACINE (TENEX) 2 MG tablet Take 1 tablet (2 mg total) by mouth 2 (two) times daily in the morning and at bedtime. 180 tablet 1   levothyroxine (EUTHYROX) 88 MCG tablet Take 1 tablet (88 mcg total) by mouth daily. 90 tablet 3   [START ON 08/03/2023] methylphenidate 36 MG PO CR tablet Take 1 tablet (36 mg total) by mouth 2 (two) times daily, morning and afternoon. 60 tablet 0   methylphenidate 36 MG PO CR tablet Take 1 tablet (36 mg total) by mouth 2 (two) times daily, morning and afternoon 60 tablet 0   methylphenidate 36 MG PO CR tablet Take 1 tablet (36 mg total) by mouth 2 (two) times daily (morning and afternoon) 60 tablet 0   pilocarpine (SALAGEN) 5 MG tablet Take 1 tablet (5 mg total) by mouth 4 (four) times daily. 360 tablet 1   pramipexole (MIRAPEX) 0.25 MG tablet Take 1 tablet (0.25 mg total)  by mouth 3 (three) times daily. 90 tablet 0   propranolol ER (INDERAL LA) 60 MG 24 hr capsule Take 1 capsule (60 mg) by mouth in the morning. 30 capsule 1   sertraline (ZOLOFT) 100 MG tablet Take 1 tablet (100 mg total) by mouth daily. 90 tablet 1   No current facility-administered medications on file prior to visit.    Allergies:   Allergies  Allergen Reactions   Reglan [Metoclopramide] Shortness Of Breath    Physical Exam General: well developed, well nourished pleasant  [name], seated, in no evident distress Head: head normocephalic and atraumatic.   Neck: supple with no carotid or supraclavicular bruits Cardiovascular: regular rate and rhythm, no murmurs Musculoskeletal: no deformity Skin:  no rash/petichiae Vascular:  Normal pulses all extremities  Neurologic Exam Mental Status: Awake and fully alert. Oriented to place and time. Recent and remote memory intact. Attention span, concentration and fund of knowledge appropriate. Mood and affect appropriate.  Glabellar tap is absent.  Good facial expression.  MMSE 30/30 no deficits.  Able to name 12 animals which can walk on 4 legs.  Clock drawing 4/4. Cranial Nerves: Fundoscopic exam reveals sharp disc margins. Pupils equal, briskly reactive to light. Extraocular movements full without nystagmus. Visual fields full to confrontation. Hearing intact. Facial sensation intact. Face, tongue, palate moves normally and symmetrically.  Motor: Normal bulk and tone. Normal strength in all tested extremity muscles.  No cogwheel rigidity.  No resting or action tremor.   No head neck or voice tremor. Sensory.: intact to touch , pinprick , position and vibratory sensation.  Coordination: Rapid alternating movements normal in all extremities. Finger-to-nose and heel-to-shin performed accurately bilaterally. Gait and Station: Arises from chair without difficulty. Stance is normal. Gait demonstrates normal stride length and balance .  Able to heel, toe and tandem walk without difficulty.  No stooped posture or gait festination.  Good postural balance to threat Reflexes: 1+ and symmetric. Toes downgoing.       07/15/2023    1:31 PM  MMSE - Mini Mental State Exam  Orientation to time 5  Orientation to Place 5  Registration 3  Attention/ Calculation 5  Recall 3  Language- name 2 objects 2  Language- repeat 1  Language- follow 3 step command 3  Language- read & follow direction 1  Write a sentence 1  Copy design 1  Total score 30     ASSESSMENT: 62 year old Caucasian lady with history of bilateral upper extremity fine tremors of unclear etiology possibly situational anxiety.versus  Medication effect.  Episode of transient right-sided numbness and heaviness March 2022.  TIA versus anxiety     PLANI had a long discussion with the patient regarding her upper extremity action tremors which seem to have responded quite well to low-dose Inderal and I recommend she continue taking Inderal LA 60 mg daily.  She is also having some mild cognitive difficulties which are likely related to her attention deficit and I recommend she follow-up with her primary care physician for adjustment of her attention deficit medications.  She will return for follow-up with me in the future only as needed and no scheduled appointment was made. Greater than 50% time during this 35-minute  visit was spent in counseling and coordination of care about her care medicine patient anxiety discussion about differential diagnosis  Delia Heady, MD Note: This document was prepared with digital dictation and possible smart phrase technology. Any transcriptional errors that result from this process are unintentional.

## 2023-07-19 ENCOUNTER — Other Ambulatory Visit: Payer: Self-pay

## 2023-07-19 ENCOUNTER — Other Ambulatory Visit (HOSPITAL_COMMUNITY): Payer: Self-pay

## 2023-07-19 MED ORDER — COLESTIPOL HCL 1 G PO TABS
2.0000 g | ORAL_TABLET | Freq: Every day | ORAL | 3 refills | Status: DC
  Filled 2023-07-19: qty 60, 30d supply, fill #0
  Filled 2023-08-26: qty 60, 30d supply, fill #1
  Filled 2023-10-05: qty 60, 30d supply, fill #2
  Filled 2023-11-09 (×2): qty 60, 30d supply, fill #3

## 2023-07-22 ENCOUNTER — Encounter: Payer: Self-pay | Admitting: Family Medicine

## 2023-07-22 DIAGNOSIS — K2960 Other gastritis without bleeding: Secondary | ICD-10-CM | POA: Diagnosis not present

## 2023-07-22 DIAGNOSIS — K9089 Other intestinal malabsorption: Secondary | ICD-10-CM | POA: Diagnosis not present

## 2023-07-22 DIAGNOSIS — K30 Functional dyspepsia: Secondary | ICD-10-CM | POA: Diagnosis not present

## 2023-07-29 ENCOUNTER — Other Ambulatory Visit (HOSPITAL_BASED_OUTPATIENT_CLINIC_OR_DEPARTMENT_OTHER): Payer: Self-pay

## 2023-07-29 ENCOUNTER — Encounter: Payer: Self-pay | Admitting: Pharmacist

## 2023-07-29 ENCOUNTER — Other Ambulatory Visit (HOSPITAL_COMMUNITY): Payer: Self-pay

## 2023-07-29 ENCOUNTER — Other Ambulatory Visit: Payer: Self-pay

## 2023-07-29 MED ORDER — METHYLPHENIDATE HCL ER (OSM) 54 MG PO TBCR
54.0000 mg | EXTENDED_RELEASE_TABLET | Freq: Two times a day (BID) | ORAL | 0 refills | Status: DC
  Filled 2023-07-29 – 2023-08-01 (×2): qty 60, 30d supply, fill #0

## 2023-07-30 ENCOUNTER — Other Ambulatory Visit (HOSPITAL_COMMUNITY): Payer: Self-pay

## 2023-07-31 ENCOUNTER — Other Ambulatory Visit (HOSPITAL_COMMUNITY): Payer: Self-pay

## 2023-08-01 ENCOUNTER — Other Ambulatory Visit: Payer: Self-pay | Admitting: Family Medicine

## 2023-08-02 ENCOUNTER — Other Ambulatory Visit: Payer: Self-pay

## 2023-08-02 ENCOUNTER — Other Ambulatory Visit (HOSPITAL_COMMUNITY): Payer: Self-pay

## 2023-08-02 MED ORDER — PROPRANOLOL HCL ER 60 MG PO CP24
60.0000 mg | ORAL_CAPSULE | Freq: Every morning | ORAL | 1 refills | Status: DC
  Filled 2023-08-02: qty 30, 30d supply, fill #0
  Filled 2023-09-02 (×2): qty 30, 30d supply, fill #1

## 2023-08-03 ENCOUNTER — Other Ambulatory Visit (HOSPITAL_COMMUNITY): Payer: Self-pay

## 2023-08-03 ENCOUNTER — Other Ambulatory Visit: Payer: Self-pay

## 2023-08-03 MED ORDER — PRAMIPEXOLE DIHYDROCHLORIDE 0.25 MG PO TABS
0.2500 mg | ORAL_TABLET | Freq: Three times a day (TID) | ORAL | 3 refills | Status: DC
  Filled 2023-08-03: qty 90, 30d supply, fill #0
  Filled 2023-11-19: qty 90, 30d supply, fill #1
  Filled 2024-02-02: qty 90, 30d supply, fill #2
  Filled 2024-03-21: qty 90, 30d supply, fill #3

## 2023-08-04 ENCOUNTER — Other Ambulatory Visit: Payer: Self-pay

## 2023-08-06 DIAGNOSIS — K5901 Slow transit constipation: Secondary | ICD-10-CM | POA: Diagnosis not present

## 2023-08-06 DIAGNOSIS — K3184 Gastroparesis: Secondary | ICD-10-CM | POA: Diagnosis not present

## 2023-08-06 DIAGNOSIS — Z713 Dietary counseling and surveillance: Secondary | ICD-10-CM | POA: Diagnosis not present

## 2023-08-12 ENCOUNTER — Other Ambulatory Visit (HOSPITAL_BASED_OUTPATIENT_CLINIC_OR_DEPARTMENT_OTHER): Payer: Self-pay | Admitting: Family

## 2023-08-17 ENCOUNTER — Other Ambulatory Visit: Payer: Self-pay

## 2023-08-17 MED ORDER — EZETIMIBE 10 MG PO TABS
10.0000 mg | ORAL_TABLET | Freq: Every day | ORAL | 3 refills | Status: DC
  Filled 2023-08-17: qty 90, 90d supply, fill #0
  Filled 2023-11-19: qty 90, 90d supply, fill #1
  Filled 2024-04-26 (×2): qty 90, 90d supply, fill #2
  Filled 2024-05-31: qty 90, 90d supply, fill #3

## 2023-08-18 ENCOUNTER — Other Ambulatory Visit: Payer: Self-pay

## 2023-08-18 ENCOUNTER — Other Ambulatory Visit (HOSPITAL_COMMUNITY): Payer: Self-pay

## 2023-08-18 DIAGNOSIS — F330 Major depressive disorder, recurrent, mild: Secondary | ICD-10-CM | POA: Diagnosis not present

## 2023-08-18 DIAGNOSIS — F419 Anxiety disorder, unspecified: Secondary | ICD-10-CM | POA: Diagnosis not present

## 2023-08-18 DIAGNOSIS — F902 Attention-deficit hyperactivity disorder, combined type: Secondary | ICD-10-CM | POA: Diagnosis not present

## 2023-08-18 MED ORDER — METHYLPHENIDATE HCL ER (OSM) 54 MG PO TBCR
54.0000 mg | EXTENDED_RELEASE_TABLET | Freq: Two times a day (BID) | ORAL | 0 refills | Status: DC
  Filled 2023-10-05: qty 60, 30d supply, fill #0

## 2023-08-18 MED ORDER — SERTRALINE HCL 100 MG PO TABS
100.0000 mg | ORAL_TABLET | Freq: Every day | ORAL | 1 refills | Status: AC
  Filled 2023-10-25: qty 90, 90d supply, fill #0
  Filled 2024-05-09: qty 90, 90d supply, fill #1

## 2023-08-18 MED ORDER — METHYLPHENIDATE HCL ER (OSM) 54 MG PO TBCR
54.0000 mg | EXTENDED_RELEASE_TABLET | Freq: Two times a day (BID) | ORAL | 0 refills | Status: DC
  Filled 2023-11-09: qty 60, 30d supply, fill #0

## 2023-08-18 MED ORDER — BUPROPION HCL ER (XL) 150 MG PO TB24
150.0000 mg | ORAL_TABLET | Freq: Every morning | ORAL | 1 refills | Status: DC
  Filled 2023-08-18: qty 90, 90d supply, fill #0
  Filled 2023-10-25: qty 90, 90d supply, fill #1

## 2023-08-18 MED ORDER — METHYLPHENIDATE HCL ER (OSM) 54 MG PO TBCR
54.0000 mg | EXTENDED_RELEASE_TABLET | Freq: Two times a day (BID) | ORAL | 0 refills | Status: DC
  Filled 2023-09-02: qty 60, 30d supply, fill #0

## 2023-08-19 ENCOUNTER — Ambulatory Visit: Payer: BC Managed Care – PPO | Admitting: Behavioral Health

## 2023-08-26 ENCOUNTER — Other Ambulatory Visit: Payer: Self-pay | Admitting: Family Medicine

## 2023-08-26 ENCOUNTER — Other Ambulatory Visit (HOSPITAL_COMMUNITY): Payer: Self-pay

## 2023-08-26 MED ORDER — ATORVASTATIN CALCIUM 80 MG PO TABS
80.0000 mg | ORAL_TABLET | Freq: Every day | ORAL | 0 refills | Status: DC
  Filled 2023-08-26: qty 90, 90d supply, fill #0

## 2023-08-31 DIAGNOSIS — F419 Anxiety disorder, unspecified: Secondary | ICD-10-CM | POA: Diagnosis not present

## 2023-08-31 DIAGNOSIS — F330 Major depressive disorder, recurrent, mild: Secondary | ICD-10-CM | POA: Diagnosis not present

## 2023-08-31 DIAGNOSIS — F902 Attention-deficit hyperactivity disorder, combined type: Secondary | ICD-10-CM | POA: Diagnosis not present

## 2023-08-31 DIAGNOSIS — F4325 Adjustment disorder with mixed disturbance of emotions and conduct: Secondary | ICD-10-CM | POA: Diagnosis not present

## 2023-09-02 ENCOUNTER — Other Ambulatory Visit (HOSPITAL_COMMUNITY): Payer: Self-pay

## 2023-09-06 ENCOUNTER — Other Ambulatory Visit (HOSPITAL_COMMUNITY): Payer: Self-pay

## 2023-09-26 ENCOUNTER — Encounter: Payer: Self-pay | Admitting: Family Medicine

## 2023-09-29 ENCOUNTER — Ambulatory Visit: Payer: BC Managed Care – PPO | Admitting: Family Medicine

## 2023-09-29 ENCOUNTER — Encounter: Payer: Self-pay | Admitting: Family Medicine

## 2023-09-29 VITALS — BP 132/88 | HR 99 | Temp 97.8°F | Wt 128.2 lb

## 2023-09-29 DIAGNOSIS — S7001XA Contusion of right hip, initial encounter: Secondary | ICD-10-CM | POA: Diagnosis not present

## 2023-09-29 NOTE — Progress Notes (Signed)
   Subjective:    Patient ID: Dorothy Wood, female    DOB: 1960/12/10, 63 y.o.   MRN: [id number]  HPI She states that she fell yesterday while taking the garbage out landing on her right hip.  She was able to walk but was uncomfortable.  She now has some bruising has concerns about it.   Review of Systems     Objective:    Physical Exam Alert and in no distress.  Motion of the hip was normal.  Does have an ecchymotic area to the right posterolateral hip area with minimal induration.  No evidence of hematoma.       Assessment & Plan:  Contusion of right hip, initial encounter I explained that at this point I see no evidence of anything other than a contusion.  Recommend ice for 20 minutes 3 times per day for the next several days and then switching over to heat.  Tylenol  or NSAID of choice is fine for the discomfort.  She was comfortable with that.

## 2023-10-05 ENCOUNTER — Other Ambulatory Visit (HOSPITAL_COMMUNITY): Payer: Self-pay

## 2023-10-12 ENCOUNTER — Other Ambulatory Visit: Payer: Self-pay

## 2023-10-25 ENCOUNTER — Other Ambulatory Visit: Payer: Self-pay | Admitting: Family Medicine

## 2023-10-25 ENCOUNTER — Other Ambulatory Visit (HOSPITAL_BASED_OUTPATIENT_CLINIC_OR_DEPARTMENT_OTHER): Payer: Self-pay

## 2023-10-25 ENCOUNTER — Other Ambulatory Visit: Payer: Self-pay

## 2023-10-25 ENCOUNTER — Other Ambulatory Visit (HOSPITAL_COMMUNITY): Payer: Self-pay

## 2023-10-25 MED ORDER — PROPRANOLOL HCL ER 60 MG PO CP24
60.0000 mg | ORAL_CAPSULE | Freq: Every morning | ORAL | 2 refills | Status: DC
  Filled 2023-10-25 (×2): qty 30, 30d supply, fill #0
  Filled 2023-11-19: qty 30, 30d supply, fill #1
  Filled 2023-12-03 – 2024-02-02 (×2): qty 30, 30d supply, fill #2

## 2023-11-02 ENCOUNTER — Ambulatory Visit: Payer: BC Managed Care – PPO | Admitting: Neurology

## 2023-11-09 ENCOUNTER — Encounter: Payer: Self-pay | Admitting: Internal Medicine

## 2023-11-09 ENCOUNTER — Other Ambulatory Visit (HOSPITAL_COMMUNITY): Payer: Self-pay

## 2023-11-19 ENCOUNTER — Encounter (HOSPITAL_COMMUNITY): Payer: Self-pay

## 2023-11-19 ENCOUNTER — Other Ambulatory Visit (HOSPITAL_COMMUNITY): Payer: Self-pay

## 2023-11-19 ENCOUNTER — Other Ambulatory Visit: Payer: Self-pay | Admitting: Family Medicine

## 2023-11-19 ENCOUNTER — Other Ambulatory Visit: Payer: Self-pay

## 2023-11-19 DIAGNOSIS — M3500 Sicca syndrome, unspecified: Secondary | ICD-10-CM

## 2023-11-19 MED ORDER — ATORVASTATIN CALCIUM 80 MG PO TABS
80.0000 mg | ORAL_TABLET | Freq: Every day | ORAL | 1 refills | Status: DC
  Filled 2023-11-19: qty 90, 90d supply, fill #0
  Filled 2024-05-31: qty 90, 90d supply, fill #1

## 2023-11-19 MED ORDER — PILOCARPINE HCL 5 MG PO TABS
5.0000 mg | ORAL_TABLET | Freq: Four times a day (QID) | ORAL | 1 refills | Status: AC
  Filled 2023-11-19: qty 360, 90d supply, fill #0
  Filled 2024-08-01 – 2024-08-08 (×2): qty 360, 90d supply, fill #1

## 2023-11-20 ENCOUNTER — Other Ambulatory Visit (HOSPITAL_BASED_OUTPATIENT_CLINIC_OR_DEPARTMENT_OTHER): Payer: Self-pay

## 2023-11-20 ENCOUNTER — Other Ambulatory Visit (HOSPITAL_COMMUNITY): Payer: Self-pay

## 2023-11-22 ENCOUNTER — Other Ambulatory Visit: Payer: Self-pay

## 2023-11-25 ENCOUNTER — Other Ambulatory Visit: Payer: Self-pay

## 2023-11-25 ENCOUNTER — Other Ambulatory Visit (HOSPITAL_COMMUNITY): Payer: Self-pay

## 2023-11-25 DIAGNOSIS — F4325 Adjustment disorder with mixed disturbance of emotions and conduct: Secondary | ICD-10-CM | POA: Diagnosis not present

## 2023-11-25 DIAGNOSIS — K2960 Other gastritis without bleeding: Secondary | ICD-10-CM | POA: Diagnosis not present

## 2023-11-25 DIAGNOSIS — F902 Attention-deficit hyperactivity disorder, combined type: Secondary | ICD-10-CM | POA: Diagnosis not present

## 2023-11-25 DIAGNOSIS — F419 Anxiety disorder, unspecified: Secondary | ICD-10-CM | POA: Diagnosis not present

## 2023-11-25 DIAGNOSIS — F330 Major depressive disorder, recurrent, mild: Secondary | ICD-10-CM | POA: Diagnosis not present

## 2023-11-25 DIAGNOSIS — D126 Benign neoplasm of colon, unspecified: Secondary | ICD-10-CM | POA: Diagnosis not present

## 2023-11-25 DIAGNOSIS — K9089 Other intestinal malabsorption: Secondary | ICD-10-CM | POA: Diagnosis not present

## 2023-11-25 DIAGNOSIS — K3184 Gastroparesis: Secondary | ICD-10-CM | POA: Diagnosis not present

## 2023-11-25 MED ORDER — BUPROPION HCL ER (XL) 300 MG PO TB24
300.0000 mg | ORAL_TABLET | Freq: Every morning | ORAL | 1 refills | Status: DC
  Filled 2023-11-25: qty 90, 90d supply, fill #0
  Filled 2024-03-21: qty 90, 90d supply, fill #1

## 2023-11-25 MED ORDER — GUANFACINE HCL 2 MG PO TABS
2.0000 mg | ORAL_TABLET | Freq: Two times a day (BID) | ORAL | 1 refills | Status: AC
  Filled 2023-11-25: qty 180, 90d supply, fill #0

## 2023-11-25 MED ORDER — METHYLPHENIDATE HCL 20 MG PO TABS: 20.0000 mg | ORAL_TABLET | Freq: Two times a day (BID) | ORAL | 0 refills | Status: DC

## 2023-11-25 MED ORDER — SERTRALINE HCL 100 MG PO TABS
100.0000 mg | ORAL_TABLET | Freq: Every day | ORAL | 1 refills | Status: DC
  Filled 2023-11-25 – 2024-02-04 (×2): qty 90, 90d supply, fill #0

## 2023-11-25 MED ORDER — METHYLPHENIDATE HCL 20 MG PO TABS
20.0000 mg | ORAL_TABLET | Freq: Two times a day (BID) | ORAL | 0 refills | Status: DC
  Filled 2023-11-25: qty 60, 30d supply, fill #0

## 2023-11-26 ENCOUNTER — Other Ambulatory Visit (HOSPITAL_COMMUNITY): Payer: Self-pay

## 2023-12-03 ENCOUNTER — Other Ambulatory Visit (HOSPITAL_COMMUNITY): Payer: Self-pay

## 2023-12-03 MED ORDER — COLESTIPOL HCL 1 G PO TABS
2.0000 g | ORAL_TABLET | Freq: Every day | ORAL | 11 refills | Status: AC
  Filled 2023-12-03: qty 60, 30d supply, fill #0
  Filled 2024-01-15: qty 60, 30d supply, fill #1
  Filled 2024-02-09: qty 60, 30d supply, fill #2
  Filled 2024-03-21: qty 60, 30d supply, fill #3
  Filled 2024-04-26: qty 60, 30d supply, fill #4
  Filled 2024-05-31: qty 60, 30d supply, fill #5
  Filled 2024-07-01: qty 60, 30d supply, fill #6
  Filled 2024-08-08: qty 60, 30d supply, fill #7
  Filled 2024-09-15: qty 60, 30d supply, fill #8

## 2023-12-08 ENCOUNTER — Other Ambulatory Visit (HOSPITAL_COMMUNITY): Payer: Self-pay

## 2023-12-08 ENCOUNTER — Encounter (HOSPITAL_COMMUNITY): Payer: Self-pay

## 2023-12-08 ENCOUNTER — Emergency Department (HOSPITAL_COMMUNITY)
Admission: EM | Admit: 2023-12-08 | Discharge: 2023-12-08 | Disposition: A | Discharge status: Home / Self Care | Attending: Emergency Medicine | Admitting: Emergency Medicine

## 2023-12-08 ENCOUNTER — Other Ambulatory Visit: Payer: Self-pay

## 2023-12-08 DIAGNOSIS — Z7982 Long term (current) use of aspirin: Secondary | ICD-10-CM | POA: Diagnosis not present

## 2023-12-08 DIAGNOSIS — Z8673 Personal history of transient ischemic attack (TIA), and cerebral infarction without residual deficits: Secondary | ICD-10-CM | POA: Diagnosis not present

## 2023-12-08 DIAGNOSIS — R112 Nausea with vomiting, unspecified: Secondary | ICD-10-CM | POA: Diagnosis not present

## 2023-12-08 DIAGNOSIS — I10 Essential (primary) hypertension: Secondary | ICD-10-CM | POA: Insufficient documentation

## 2023-12-08 DIAGNOSIS — E039 Hypothyroidism, unspecified: Secondary | ICD-10-CM | POA: Insufficient documentation

## 2023-12-08 DIAGNOSIS — Z79899 Other long term (current) drug therapy: Secondary | ICD-10-CM | POA: Insufficient documentation

## 2023-12-08 DIAGNOSIS — Z8543 Personal history of malignant neoplasm of ovary: Secondary | ICD-10-CM | POA: Insufficient documentation

## 2023-12-08 DIAGNOSIS — R197 Diarrhea, unspecified: Secondary | ICD-10-CM | POA: Diagnosis not present

## 2023-12-08 LAB — LIPASE, BLOOD: Lipase: 28 U/L (ref 11–51)

## 2023-12-08 LAB — COMPREHENSIVE METABOLIC PANEL WITH GFR
ALT: 30 U/L (ref 0–44)
AST: 42 U/L — ABNORMAL HIGH (ref 15–41)
Albumin: 4.6 g/dL (ref 3.5–5.0)
Alkaline Phosphatase: 83 U/L (ref 38–126)
Anion gap: 18 — ABNORMAL HIGH (ref 5–15)
BUN: 12 mg/dL (ref 8–23)
CO2: 18 mmol/L — ABNORMAL LOW (ref 22–32)
Calcium: 10.1 mg/dL (ref 8.9–10.3)
Chloride: 105 mmol/L (ref 98–111)
Creatinine, Ser: 0.76 mg/dL (ref 0.44–1.00)
GFR, Estimated: >60 mL/min
Glucose, Bld: 173 mg/dL — ABNORMAL HIGH (ref 70–99)
Potassium: 3.7 mmol/L (ref 3.5–5.1)
Sodium: 141 mmol/L (ref 135–145)
Total Bilirubin: 0.4 mg/dL (ref 0.0–1.2)
Total Protein: 7.5 g/dL (ref 6.5–8.1)

## 2023-12-08 LAB — URINALYSIS, ROUTINE W REFLEX MICROSCOPIC
Bacteria, UA: NONE SEEN
Bilirubin Urine: NEGATIVE
Glucose, UA: NEGATIVE mg/dL
Hgb urine dipstick: NEGATIVE
Ketones, ur: 20 mg/dL — AB
Leukocytes,Ua: NEGATIVE
Nitrite: NEGATIVE
Protein, ur: 100 mg/dL — AB
Specific Gravity, Urine: 1.016 (ref 1.005–1.030)
pH: 8 (ref 5.0–8.0)

## 2023-12-08 LAB — CBC
HCT: 44.2 % (ref 36.0–46.0)
Hemoglobin: 14.3 g/dL (ref 12.0–15.0)
MCH: 28.6 pg (ref 26.0–34.0)
MCHC: 32.4 g/dL (ref 30.0–36.0)
MCV: 88.4 fL (ref 80.0–100.0)
Platelets: 314 K/uL (ref 150–400)
RBC: 5 MIL/uL (ref 3.87–5.11)
RDW: 12.7 % (ref 11.5–15.5)
WBC: 10.5 K/uL (ref 4.0–10.5)
nRBC: 0 % (ref 0.0–0.2)

## 2023-12-08 LAB — MAGNESIUM: Magnesium: 1.8 mg/dL (ref 1.7–2.4)

## 2023-12-08 MED ORDER — LACTATED RINGERS IV BOLUS
1000.0000 mL | Freq: Once | INTRAVENOUS | Status: AC
  Administered 2023-12-08: 1000 mL via INTRAVENOUS

## 2023-12-08 MED ORDER — COLESTIPOL HCL 1 G PO TABS
2.0000 g | ORAL_TABLET | Freq: Once | ORAL | Status: AC
  Administered 2023-12-08: 2 g via ORAL
  Filled 2023-12-08: qty 2

## 2023-12-08 MED ORDER — LORAZEPAM 2 MG/ML IJ SOLN
1.0000 mg | Freq: Once | INTRAMUSCULAR | Status: AC
Start: 2023-12-08 — End: 2023-12-08
  Administered 2023-12-08: 1 mg via INTRAVENOUS
  Filled 2023-12-08: qty 1

## 2023-12-08 MED ORDER — PANTOPRAZOLE SODIUM 40 MG IV SOLR
40.0000 mg | Freq: Once | INTRAVENOUS | Status: AC
  Administered 2023-12-08: 40 mg via INTRAVENOUS
  Filled 2023-12-08: qty 10

## 2023-12-08 MED ORDER — LORAZEPAM 1 MG PO TABS
1.0000 mg | ORAL_TABLET | Freq: Three times a day (TID) | ORAL | 0 refills | Status: DC | PRN
  Filled 2023-12-08 (×2): qty 6, 2d supply, fill #0

## 2023-12-08 NOTE — ED Triage Notes (Signed)
 Pt reports nausea/vomiting since Sunday, similar to previous gastroparesis flares but is having diarrhea with bile this time which is unusual.

## 2023-12-08 NOTE — ED Notes (Signed)
 Pt made aware of UA sample. Unable to void att. Pt states "will let you know when I go".

## 2023-12-08 NOTE — ED Provider Notes (Signed)
 [place] EMERGENCY DEPARTMENT AT [facility] Provider Note   CSN: [id number] Arrival date & time: 12/08/23  7829     History  Chief Complaint  Patient presents with   Emesis    Dorothy Wood is a 63 y.o. female.  HPI     63 year old female with a history of gastroparesis, QTc prolongation, history of cholecystectomy, history of sigmoid stricture, delayed gastric emptying, esophagitis, hypertension, hyperlipidemia, ovarian cancer, Sjogren's, hypothyroidism who presents with concern for nausea, vomiting and diarrhea.  Reports symptoms began around 3 AM last night.  She is had 10 episodes of vomiting and about 10 episodes of diarrhea.  Denies any black or bloody stools or hematemesis.  Denies any dysuria, fever, abdominal pain.  Denies any recent travel, antibiotics, known sick contacts.  The nausea vomiting is typical of her gastroparesis, however she does not typically have diarrhea.  She feels weak.  No other cough, congestion or other concerning symptoms.    Past Medical History:  Diagnosis Date   Arthritis    Cancer (HCC)    OVARIAN   Colonic polyp    Depression    Gastritis    History of colonic polyps 04/11/2013   IMO SNOMED Dx Update Oct 2024     Hyperlipidemia    Hypertension    Hypothyroidism    Sjogren's disease (HCC)    Stroke [facility])    Thyroid disease    HYPOTHYROIDISM   TIA (transient ischemic attack) 04/06/2021     Home Medications Prior to Admission medications   Medication Sig Start Date End Date Taking? Authorizing Provider  LORazepam (ATIVAN) 1 MG tablet Take 1 tablet (1 mg total) by mouth 3 (three) times daily as needed for anxiety. 12/08/23  Yes Alvira Monday, MD  aspirin EC 81 MG tablet Take 81 mg by mouth every morning. Swallow whole.    [provider]  atorvastatin (LIPITOR) 80 MG tablet Take 1 tablet (80 mg total) by mouth daily. 11/19/23   Ronnald Nian, MD  buPROPion (WELLBUTRIN XL) 150 MG 24 hr tablet Take 1  tablet (150 mg total) by mouth every morning. 11/12/22     buPROPion (WELLBUTRIN XL) 150 MG 24 hr tablet Take 1 tablet (150 mg total) by mouth every morning. 08/18/23     buPROPion (WELLBUTRIN XL) 300 MG 24 hr tablet Take 1 tablet (300 mg total) by mouth every morning. 11/25/23     colestipol (COLESTID) 1 g tablet Take 2 tablets (2 g total) by mouth daily. 12/03/23     ezetimibe (ZETIA) 10 MG tablet Take 1 tablet by mouth daily. 08/17/23 08/11/24  Alver Sorrow, NP  famotidine (PEPCID) 40 MG tablet Take 1 tablet (40 mg total) by mouth daily. 03/11/23   Tysinger, Kermit Balo, PA-C  guanFACINE (TENEX) 2 MG tablet Take 1 tablet (2 mg total) by mouth 2 (two) times daily in the morning and at bedtime. 07/27/22     guanFACINE (TENEX) 2 MG tablet Take 1 tablet (2 mg total) by mouth 2 (two) times daily. 11/25/23     levothyroxine (EUTHYROX) 88 MCG tablet Take 1 tablet (88 mcg total) by mouth daily. 02/22/23     methylphenidate (RITALIN) 20 MG tablet Take 1 tablet (20 mg total) by mouth 2 (two) times daily. 11/25/23     methylphenidate (RITALIN) 20 MG tablet Take 1 tablet (20 mg total) by mouth 2 (two) times daily. 12/23/23     methylphenidate 54 MG PO CR tablet Take 1 tablet (54  mg total) by mouth 2 (two) times daily, morning and afternoon. 09/15/23     methylphenidate 54 MG PO CR tablet Take 1 tablet (54 mg total) by mouth 2 (two) times daily, morning and afternoon. 10/13/23     methylphenidate 54 MG PO CR tablet Take 1 tablet (54 mg total) by mouth 2 (two) times daily, morning and afternoon. 08/18/23     pilocarpine (SALAGEN) 5 MG tablet Take 1 tablet (5 mg total) by mouth 4 (four) times daily. 11/19/23 11/18/24  Ronnald Nian, MD  pramipexole (MIRAPEX) 0.25 MG tablet Take 1 tablet (0.25 mg total) by mouth 3 (three) times daily. 08/03/23   Ronnald Nian, MD  propranolol ER (INDERAL LA) 60 MG 24 hr capsule Take 1 capsule (60 mg) by mouth in the morning. 10/25/23   Ronnald Nian, MD  sertraline (ZOLOFT) 100 MG tablet  Take 1 tablet (100 mg total) by mouth daily. 04/05/23     sertraline (ZOLOFT) 100 MG tablet Take 1 tablet (100 mg total) by mouth daily. 08/18/23     sertraline (ZOLOFT) 100 MG tablet Take 1 tablet (100 mg total) by mouth daily. 11/25/23         Allergies    Reglan [metoclopramide], Prochlorperazine, and Promethazine hcl    Review of Systems   Review of Systems  Physical Exam Updated Vital Signs BP (!) 144/80   Pulse 67   Temp 98.9 F (37.2 C) (Oral)   Resp (!) 22   SpO2 100%  Physical Exam Vitals and nursing note reviewed.  Constitutional:      General: She is not in acute distress.    Appearance: She is well-developed. She is ill-appearing (nauseas). She is not diaphoretic.  HENT:     Head: Normocephalic and atraumatic.  Eyes:     Conjunctiva/sclera: Conjunctivae normal.  Cardiovascular:     Rate and Rhythm: Normal rate and regular rhythm.     Pulses: Normal pulses.  Pulmonary:     Effort: Pulmonary effort is normal. No respiratory distress.  Abdominal:     General: There is no distension.     Palpations: Abdomen is soft.     Tenderness: There is no abdominal tenderness. There is no guarding.  Musculoskeletal:        General: No tenderness.     Cervical back: Normal range of motion.  Skin:    General: Skin is warm and dry.     Findings: No erythema or rash.  Neurological:     Mental Status: She is alert and oriented to person, place, and time.     ED Results / Procedures / Treatments   Labs (all labs ordered are listed, but only abnormal results are displayed) Labs Reviewed  COMPREHENSIVE METABOLIC PANEL - Abnormal; Notable for the following components:      Result Value   CO2 18 (*)    Glucose, Bld 173 (*)    AST 42 (*)    Anion gap 18 (*)    All other components within normal limits  URINALYSIS, ROUTINE W REFLEX MICROSCOPIC - Abnormal; Notable for the following components:   Ketones, ur 20 (*)    Protein, ur 100 (*)    All other components within normal  limits  GASTROINTESTINAL PANEL BY PCR, STOOL (REPLACES STOOL CULTURE)  LIPASE, BLOOD  CBC  MAGNESIUM    EKG EKG Interpretation Date/Time:  Wednesday December 08 2023 08:46:15 EDT Ventricular Rate:  73 PR Interval:  153 QRS Duration:  98 QT Interval:  468 QTC Calculation: 516 R Axis:   -24  Text Interpretation: Sinus rhythm Borderline left axis deviation Abnormal R-wave progression, early transition Nonspecific T abnormalities, inferior leads Prolonged QT interval No significant change since last tracing Confirmed by Alvira Monday ([id number]) on 12/08/2023 9:57:37 AM  Radiology No results found.  Procedures Procedures    Medications Ordered in ED Medications  LORazepam (ATIVAN) injection 1 mg (1 mg Intravenous Given 12/08/23 0928)  lactated ringers bolus 1,000 mL (0 mLs Intravenous Stopped 12/08/23 1100)  colestipol (COLESTID) tablet 2 g (2 g Oral Given 12/08/23 1054)  lactated ringers bolus 1,000 mL (0 mLs Intravenous Stopped 12/08/23 1519)  pantoprazole (PROTONIX) injection 40 mg (40 mg Intravenous Given 12/08/23 1346)    ED Course/ Medical Decision Making/ A&P                                    63 year old female with a history of gastroparesis, QTc prolongation, history of cholecystectomy, history of sigmoid stricture, delayed gastric emptying, esophagitis, hypertension, hyperlipidemia, ovarian cancer, Sjogren's, hypothyroidism who presents with concern for nausea, vomiting and diarrhea.  Abdominal exam benign, have low suspicion clinically for appendicitis, choledocholithiasis, small bowel obstruction, perforation, intra-abdominal abscess or diverticulitis.  Suspect symptoms may be secondary to her underlying gastroparesis, or likely infectious gastroenteritis.  Ordered GI pathogen panel and C. difficile to evaluate for etiology of her symptoms.  Labs completed and personally evaluated by me show normal magnesium, no sign of pancreatitis, mildly decreased bicarb and  increased anion gap. Suspect related to dehydration/possible starvation ketosis. Does not have DM hx and glucose 173. CBC without leukocytosis, no anemia.  UA with small ketones, no evidence of infection.  EKG completed and personally by interpreted by me shows no acute changes from previous, does show prolonged QTc.  Given IV fluids, Ativan for nausea in the setting of prolonged QT.  Feels improved, however some continued nausea. Is tolerating some fluid in the ED po in comparison to what she was able to tolerate at home. Has had similar episodes in the past. Given additional IV fluids.  Feels stable for outpatient follow up and return for worsening. Given rx for ativan after review in [place] drug database. Patient discharged in stable condition with understanding of reasons to return.           Final Clinical Impression(s) / ED Diagnoses Final diagnoses:  Nausea vomiting and diarrhea    Rx / DC Orders ED Discharge Orders          Ordered    LORazepam (ATIVAN) 1 MG tablet  3 times daily PRN        12/08/23 1511              Alvira Monday, MD 12/08/23 2225

## 2023-12-09 ENCOUNTER — Other Ambulatory Visit (HOSPITAL_COMMUNITY): Payer: Self-pay

## 2023-12-13 ENCOUNTER — Other Ambulatory Visit (HOSPITAL_COMMUNITY): Payer: Self-pay

## 2023-12-20 ENCOUNTER — Other Ambulatory Visit (HOSPITAL_COMMUNITY): Payer: Self-pay

## 2024-01-15 ENCOUNTER — Other Ambulatory Visit (HOSPITAL_COMMUNITY): Payer: Self-pay

## 2024-01-24 ENCOUNTER — Other Ambulatory Visit (HOSPITAL_COMMUNITY): Payer: Self-pay

## 2024-01-26 ENCOUNTER — Other Ambulatory Visit (HOSPITAL_COMMUNITY): Payer: Self-pay

## 2024-01-26 ENCOUNTER — Other Ambulatory Visit: Payer: Self-pay | Admitting: Obstetrics & Gynecology

## 2024-01-26 DIAGNOSIS — Z1231 Encounter for screening mammogram for malignant neoplasm of breast: Secondary | ICD-10-CM

## 2024-01-26 MED ORDER — COTEMPLA XR-ODT 17.3 MG PO TBED
17.3000 mg | EXTENDED_RELEASE_TABLET | Freq: Every morning | ORAL | 0 refills | Status: DC
  Filled 2024-01-26: qty 30, 30d supply, fill #0

## 2024-01-27 ENCOUNTER — Other Ambulatory Visit (HOSPITAL_COMMUNITY): Payer: Self-pay

## 2024-02-02 ENCOUNTER — Other Ambulatory Visit (HOSPITAL_COMMUNITY): Payer: Self-pay

## 2024-02-03 ENCOUNTER — Other Ambulatory Visit (HOSPITAL_COMMUNITY): Payer: Self-pay

## 2024-02-04 ENCOUNTER — Other Ambulatory Visit: Payer: Self-pay | Admitting: Medical

## 2024-02-04 ENCOUNTER — Other Ambulatory Visit (HOSPITAL_COMMUNITY): Payer: Self-pay

## 2024-02-04 ENCOUNTER — Other Ambulatory Visit: Payer: Self-pay

## 2024-02-04 MED ORDER — FAMOTIDINE 40 MG PO TABS
40.0000 mg | ORAL_TABLET | Freq: Every day | ORAL | 1 refills | Status: AC
  Filled 2024-02-04: qty 90, 90d supply, fill #0

## 2024-02-10 ENCOUNTER — Other Ambulatory Visit (HOSPITAL_COMMUNITY): Payer: Self-pay

## 2024-02-10 ENCOUNTER — Ambulatory Visit
Admission: RE | Admit: 2024-02-10 | Discharge: 2024-02-10 | Disposition: A | Discharge status: Home / Self Care | Source: Ambulatory Visit | Attending: Obstetrics & Gynecology | Admitting: Obstetrics & Gynecology

## 2024-02-10 DIAGNOSIS — Z1231 Encounter for screening mammogram for malignant neoplasm of breast: Secondary | ICD-10-CM | POA: Diagnosis not present

## 2024-02-10 HISTORY — DX: Encounter for nonprocreative screening for genetic disease carrier status: Z13.71

## 2024-02-10 LAB — HM MAMMOGRAPHY

## 2024-02-14 ENCOUNTER — Encounter: Payer: Self-pay | Admitting: Obstetrics & Gynecology

## 2024-02-14 ENCOUNTER — Other Ambulatory Visit (HOSPITAL_COMMUNITY): Payer: Self-pay

## 2024-02-16 ENCOUNTER — Other Ambulatory Visit (HOSPITAL_COMMUNITY): Payer: Self-pay

## 2024-02-21 ENCOUNTER — Encounter: Payer: Self-pay | Admitting: Cardiology

## 2024-02-21 ENCOUNTER — Encounter: Payer: Self-pay | Admitting: Family Medicine

## 2024-02-21 NOTE — Telephone Encounter (Signed)
 Please review and advise.

## 2024-02-24 ENCOUNTER — Other Ambulatory Visit (HOSPITAL_COMMUNITY): Payer: Self-pay

## 2024-02-24 ENCOUNTER — Other Ambulatory Visit: Payer: Self-pay

## 2024-02-24 DIAGNOSIS — F419 Anxiety disorder, unspecified: Secondary | ICD-10-CM | POA: Diagnosis not present

## 2024-02-24 DIAGNOSIS — F902 Attention-deficit hyperactivity disorder, combined type: Secondary | ICD-10-CM | POA: Diagnosis not present

## 2024-02-24 DIAGNOSIS — F4325 Adjustment disorder with mixed disturbance of emotions and conduct: Secondary | ICD-10-CM | POA: Diagnosis not present

## 2024-02-24 DIAGNOSIS — F330 Major depressive disorder, recurrent, mild: Secondary | ICD-10-CM | POA: Diagnosis not present

## 2024-02-24 MED ORDER — ATOMOXETINE HCL 25 MG PO CAPS
ORAL_CAPSULE | ORAL | 1 refills | Status: DC
  Filled 2024-02-24: qty 60, 30d supply, fill #0
  Filled 2024-03-21: qty 60, 30d supply, fill #1

## 2024-02-25 ENCOUNTER — Other Ambulatory Visit: Payer: Self-pay

## 2024-02-26 ENCOUNTER — Other Ambulatory Visit (HOSPITAL_COMMUNITY): Payer: Self-pay

## 2024-02-28 ENCOUNTER — Other Ambulatory Visit (HOSPITAL_COMMUNITY): Payer: Self-pay

## 2024-03-21 ENCOUNTER — Other Ambulatory Visit: Payer: Self-pay | Admitting: Family Medicine

## 2024-03-22 ENCOUNTER — Other Ambulatory Visit (HOSPITAL_BASED_OUTPATIENT_CLINIC_OR_DEPARTMENT_OTHER): Payer: Self-pay

## 2024-03-22 ENCOUNTER — Other Ambulatory Visit: Payer: Self-pay

## 2024-03-22 ENCOUNTER — Other Ambulatory Visit (HOSPITAL_COMMUNITY): Payer: Self-pay

## 2024-03-22 DIAGNOSIS — F330 Major depressive disorder, recurrent, mild: Secondary | ICD-10-CM | POA: Diagnosis not present

## 2024-03-22 DIAGNOSIS — F902 Attention-deficit hyperactivity disorder, combined type: Secondary | ICD-10-CM | POA: Diagnosis not present

## 2024-03-22 DIAGNOSIS — F419 Anxiety disorder, unspecified: Secondary | ICD-10-CM | POA: Diagnosis not present

## 2024-03-22 DIAGNOSIS — F4325 Adjustment disorder with mixed disturbance of emotions and conduct: Secondary | ICD-10-CM | POA: Diagnosis not present

## 2024-03-22 MED ORDER — PROPRANOLOL HCL ER 60 MG PO CP24
60.0000 mg | ORAL_CAPSULE | Freq: Every morning | ORAL | 2 refills | Status: DC
  Filled 2024-03-22: qty 30, 30d supply, fill #0
  Filled 2024-04-26 (×2): qty 30, 30d supply, fill #1
  Filled 2024-05-31: qty 30, 30d supply, fill #2

## 2024-03-22 MED ORDER — ATOMOXETINE HCL 60 MG PO CAPS
60.0000 mg | ORAL_CAPSULE | Freq: Every day | ORAL | 2 refills | Status: DC
  Filled 2024-03-22: qty 30, 30d supply, fill #0
  Filled 2024-04-26: qty 30, 30d supply, fill #1
  Filled 2024-05-31: qty 30, 30d supply, fill #2

## 2024-03-22 MED ORDER — SERTRALINE HCL 100 MG PO TABS
100.0000 mg | ORAL_TABLET | Freq: Every day | ORAL | 1 refills | Status: AC
  Filled 2024-09-15: qty 90, 90d supply, fill #0

## 2024-03-22 MED ORDER — BUPROPION HCL ER (XL) 300 MG PO TB24
300.0000 mg | ORAL_TABLET | Freq: Every morning | ORAL | 1 refills | Status: AC
  Filled 2024-07-01: qty 90, 90d supply, fill #0

## 2024-03-22 MED ORDER — ATOMOXETINE HCL 25 MG PO CAPS
ORAL_CAPSULE | ORAL | 0 refills | Status: AC
  Filled 2024-03-22: qty 67, 37d supply, fill #0

## 2024-03-22 MED ORDER — GUANFACINE HCL 2 MG PO TABS
2.0000 mg | ORAL_TABLET | Freq: Two times a day (BID) | ORAL | 1 refills | Status: AC
  Filled 2024-03-22: qty 180, 90d supply, fill #0

## 2024-03-30 ENCOUNTER — Encounter: Payer: Self-pay | Admitting: Family Medicine

## 2024-04-03 ENCOUNTER — Other Ambulatory Visit

## 2024-04-03 DIAGNOSIS — R9431 Abnormal electrocardiogram [ECG] [EKG]: Secondary | ICD-10-CM

## 2024-04-19 DIAGNOSIS — M47812 Spondylosis without myelopathy or radiculopathy, cervical region: Secondary | ICD-10-CM | POA: Diagnosis not present

## 2024-04-19 DIAGNOSIS — M5412 Radiculopathy, cervical region: Secondary | ICD-10-CM | POA: Diagnosis not present

## 2024-04-26 ENCOUNTER — Other Ambulatory Visit (HOSPITAL_COMMUNITY): Payer: Self-pay

## 2024-05-01 ENCOUNTER — Encounter: Payer: Self-pay | Admitting: Cardiology

## 2024-05-02 ENCOUNTER — Other Ambulatory Visit (HOSPITAL_COMMUNITY): Payer: Self-pay | Admitting: Neurological Surgery

## 2024-05-02 DIAGNOSIS — M5412 Radiculopathy, cervical region: Secondary | ICD-10-CM

## 2024-05-02 NOTE — Progress Notes (Signed)
 Cardiology Office Note   Date:  05/08/2024  ID:  Dorothy Wood, DOB Apr 01, 1961, MRN [id number] PCP: Joyce Norleen BROCKS, MD  [facility] Providers Cardiologist:  Redell Shallow, MD   History of Present Illness Dorothy Wood is a 63 y.o. female with a past medical history of  Family history of CAD CT calcium  scoring in 10/2019 showed a coronary calcium  score of 0  History of TIA Echocardiogram in 03/2021 showed EF 55-60%, no regional wall motion abnormalities, normal RV systolic function, no significant valvular abnormalities Carotid Dopplers in 04/2022 showed near normal bilateral ICAs Hypertension Hyperlipidemia Hypothyroidism Sjogren's syndrome Ovarian cancer  Patient was last seen in the cardiology office on 06/17/2023.  At that time, patient denied chest pain, palpitations, dyspnea, edema, PND, orthopnea, weight gain.  BP was well-controlled.  Patient contacted the office on 8/18 reporting shortness of breath.  Was brought in for an appointment for further evaluation  Patient presents today for evaluation of shortness of breath on exertion and tachycardia.  Reports that there have been few times when she is not exerting herself very heavily but will become very short of breath.  Notes 1 episode where she had been walking back from the beach.  She only walked a short distance before becoming short of breath and noticing that her heart rate was elevated.  A similar thing happen when she was just walking across her yard.  She had a pulse ox with her so she checked her oxygen which was normal.  Noted that her heart rate was in the 140s even though she had not exerted herself that hard.  When her heart rate is elevated she does notice some palpitations. she denies having any chest discomfort.  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  Usually tolerates activity well.  Her daughter has noticed that she coughs in the mornings.  Patient does not cough throughout the  day.  She denies orthopnea.  She has a significant family history of coronary artery disease.  Reports that her dad and one of her brothers both had bypass surgery.  Her other brother was getting a cardiac catheterization was found to have an aortic dissection.  She notes that her family did not seek medical care often.  She has done a good job of controlling her blood pressure and cholesterol.    Studies Reviewed Cardiac Studies & Procedures   ______________________________________________________________________________________________     ECHOCARDIOGRAM  ECHOCARDIOGRAM COMPLETE BUBBLE STUDY 04/07/2021  Narrative ECHOCARDIOGRAM REPORT    Patient Name:   Dorothy Wood Date of Exam: 04/07/2021 Medical Rec #:  [id number]        Height:       65.0 in Accession #:    [id number]       Weight:       133.2 lb Date of Birth:  13-Mar-1961         BSA:          1.664 m Patient Age:    63 years         BP:           111/59 mmHg Patient Gender: F                HR:           77 bpm. Exam Location:  Inpatient  Procedure: 2D Echo, Cardiac Doppler, Color Doppler and Saline Contrast Bubble Study  Indications:    TIA G45.9  History:  Patient has prior history of Echocardiogram examinations, most recent 04/30/2020. Risk Factors:Hypertension and Dyslipidemia. Cancer. Thyroid  Disease.  Sonographer:    Annabella Dance Referring Phys: [id number] ANASTASSIA DOUTOVA  IMPRESSIONS   1. Left ventricular ejection fraction, by estimation, is 55 to 60%. The left ventricle has normal function. The left ventricle has no regional wall motion abnormalities. Left ventricular diastolic parameters were normal. 2. Right ventricular systolic function is normal. The right ventricular size is normal. Tricuspid regurgitation signal is inadequate for assessing PA pressure. 3. The mitral valve is normal in structure. No evidence of mitral valve regurgitation. No evidence of mitral stenosis. 4. The aortic valve is  tricuspid. Aortic valve regurgitation is not visualized. No aortic stenosis is present. 5. The inferior vena cava is normal in size with greater than 50% respiratory variability, suggesting right atrial pressure of 3 mmHg.  FINDINGS Left Ventricle: Left ventricular ejection fraction, by estimation, is 55 to 60%. The left ventricle has normal function. The left ventricle has no regional wall motion abnormalities. The left ventricular internal cavity size was normal in size. There is no left ventricular hypertrophy. Left ventricular diastolic parameters were normal.  Right Ventricle: The right ventricular size is normal. No increase in right ventricular wall thickness. Right ventricular systolic function is normal. Tricuspid regurgitation signal is inadequate for assessing PA pressure.  Left Atrium: Left atrial size was normal in size.  Right Atrium: Right atrial size was normal in size.  Pericardium: There is no evidence of pericardial effusion.  Mitral Valve: The mitral valve is normal in structure. No evidence of mitral valve regurgitation. No evidence of mitral valve stenosis.  Tricuspid Valve: The tricuspid valve is normal in structure. Tricuspid valve regurgitation is not demonstrated.  Aortic Valve: The aortic valve is tricuspid. Aortic valve regurgitation is not visualized. No aortic stenosis is present.  Pulmonic Valve: The pulmonic valve was normal in structure. Pulmonic valve regurgitation is not visualized.  Aorta: The aortic root is normal in size and structure.  Venous: The inferior vena cava is normal in size with greater than 50% respiratory variability, suggesting right atrial pressure of 3 mmHg.  IAS/Shunts: No atrial level shunt detected by color flow Doppler. Agitated saline contrast was given intravenously to evaluate for intracardiac shunting.   LEFT VENTRICLE PLAX 2D LVIDd:         3.90 cm  Diastology LVIDs:         2.60 cm  LV e' medial:    11.00 cm/s LV PW:          0.80 cm  LV E/e' medial:  7.5 LV IVS:        0.90 cm  LV e' lateral:   12.80 cm/s LVOT diam:     1.60 cm  LV E/e' lateral: 6.5 LV SV:         39 LV SV Index:   23 LVOT Area:     2.01 cm   RIGHT VENTRICLE            IVC RV Basal diam:  2.40 cm    IVC diam: 1.50 cm RV S prime:     9.57 cm/s TAPSE (M-mode): 1.8 cm  LEFT ATRIUM             Index       RIGHT ATRIUM          Index LA diam:        3.00 cm 1.80 cm/m  RA Area:     8.71 cm LA  Vol (A2C):   22.2 ml 13.34 ml/m RA Volume:   16.00 ml 9.61 ml/m LA Vol (A4C):   15.2 ml 9.13 ml/m LA Biplane Vol: 18.8 ml 11.30 ml/m AORTIC VALVE LVOT Vmax:   85.00 cm/s LVOT Vmean:  61.500 cm/s LVOT VTI:    0.192 m  AORTA Ao Root diam: 3.30 cm Ao Asc diam:  3.10 cm  MITRAL VALVE MV Area (PHT): 3.65 cm    SHUNTS MV Decel Time: 208 msec    Systemic VTI:  0.19 m MV E velocity: 82.60 cm/s  Systemic Diam: 1.60 cm MV A velocity: 71.70 cm/s MV E/A ratio:  1.15  Ezra Shuck MD Electronically signed by Ezra Shuck MD Signature Date/Time: 04/07/2021/4:47:48 PM    Final      CT SCANS  CT CARDIAC SCORING (SELF PAY ONLY) 10/27/2019  Addendum 10/27/2019  9:06 PM ADDENDUM REPORT: 10/27/2019 21:04  CLINICAL DATA:  Risk stratification  EXAM: Coronary Calcium  Score  TECHNIQUE: The patient was scanned on a [employer] scanner. Axial non-contrast 3 mm slices were carried out through the heart. The data set was analyzed on a dedicated work station and scored using the Agatson method.  FINDINGS: Non-cardiac: See separate report from [facility].  Ascending Aorta: Normal size  Pericardium: Normal  Coronary arteries: Calcium  score 0  IMPRESSION: Coronary calcium  score of 0.   Electronically Signed By: Lonni Nanas MD On: 10/27/2019 21:04  Narrative EXAM: OVER-READ INTERPRETATION  CT CHEST  The following report is an over-read performed by radiologist Dr. Juliene Balder of [facility] on  10/27/2019. This over-read does not include interpretation of cardiac or coronary anatomy or pathology. The coronary calcium  score/coronary CTA interpretation by the cardiologist is attached.  COMPARISON:  None.  FINDINGS: Vascular: Ascending thoracic aorta measures 3.3 cm. Descending thoracic aorta measures 2.3 cm. Heart size is normal. Minimal pericardial fluid.  Mediastinum/Nodes: Visualized mediastinal structures are unremarkable.  Lungs/Pleura: Small peripheral or pleural based density in the posterior right lower lobe measures 3 mm on sequence 3, image 40. No large pleural effusions. Mild pleural thickening along the anterior left upper lobe on sequence 3, image 10. Small amount of focal pleural-based thickening near the anterior aspect of the right minor fissure on sequence 3, image 12. Slightly prominent interstitial densities in the medial left upper lobe on sequence 3, image 8. Mild pleural thickening in left lower lobe near the left major fissure on sequence 3, image 25.  Upper Abdomen: Images of the upper abdomen are unremarkable.  Musculoskeletal: No acute bone abnormality.  IMPRESSION: Scattered areas of pleural thickening and questionable scarring in the lungs. Findings are nonspecific. There is at least 1 peripheral or pleural based nodule measuring up to 3 mm that is indeterminate. No follow-up needed if patient is low-risk. Non-contrast chest CT can be considered in 12 months if patient is high-risk. This recommendation follows the consensus statement: Guidelines for Management of Incidental Pulmonary Nodules Detected on CT Images: From the Fleischner Society 2017; Radiology 2017; 284:228-243.  Electronically Signed: By: Juliene Balder M.D. On: 10/27/2019 16:10     ______________________________________________________________________________________________       Risk Assessment/Calculations           Physical Exam VS:  BP 122/80 (BP Location: Left  Arm, Patient Position: Sitting, Cuff Size: Normal)   Pulse 72   Ht 5' 5 (1.651 m)   Wt 133 lb (60.3 kg)   SpO2 98%   BMI 22.13 kg/m  Wt Readings from Last 3 Encounters:  05/08/24 133 lb (60.3 kg)  09/29/23 128 lb 3.2 oz (58.2 kg)  07/15/23 128 lb (58.1 kg)    GEN: Well nourished, well developed in no acute distress.  Sitting comfortably on the exam table NECK: No JVD CARDIAC: RRR, no murmurs, rubs, gallops RESPIRATORY:  Clear to auscultation without rales, wheezing or rhonchi.  Normal work of breathing on room air ABDOMEN: Soft, non-tender, non-distended EXTREMITIES:  No edema; No deformity   ASSESSMENT AND PLAN  Dyspnea on exertion  Palpitations  - Patient reports that she has been having episodes of dyspnea on exertion. Notes that she will get short of breath doing an activity that she normally could tolerate. An example was walking across her yard and feeling like she was struggling to breathe. During this episode, she also noted that her HR was elevated to the 140s. Associated with palpitations  - Patient has noticed that when her HR becomes elevated and she has palpitations, she becomes short of breath - Patient denies orthopnea, ankle edema. She is euvolemic on exam today  - Ordered echocardiogram  - Ordered 14 day zio  - Continue propranolol  60 mg daily   Family history of CAD  - Had coronary calcium  score of 0 in 10/2019 - Echo in 03/2021 with EF 55-60%, no wall motion abnormalities, normal RV systolic function, no significant valvular abnormalities - Patient denies chest pain  - Continue Lipitor 80 mg daily and Zetia  10 mg daily  HTN  - BP well controlled  - Continue propranolol  60 mg daily   HLD  - Lipid panel from 06/2023 with LDL 49 - Continue Lipitor 80 mg daily, Zetia  10 mg daily   Dispo: Follow up with Dr. Pietro in 6 months   Signed, Rollo FABIENE Louder, PA-C

## 2024-05-03 ENCOUNTER — Other Ambulatory Visit: Payer: Self-pay | Admitting: Neurological Surgery

## 2024-05-08 ENCOUNTER — Ambulatory Visit: Attending: Cardiology | Admitting: Cardiology

## 2024-05-08 ENCOUNTER — Ambulatory Visit: Attending: Cardiology

## 2024-05-08 ENCOUNTER — Encounter: Payer: Self-pay | Admitting: Cardiology

## 2024-05-08 VITALS — BP 122/80 | HR 72 | Ht 65.0 in | Wt 133.0 lb

## 2024-05-08 DIAGNOSIS — E785 Hyperlipidemia, unspecified: Secondary | ICD-10-CM

## 2024-05-08 DIAGNOSIS — R002 Palpitations: Secondary | ICD-10-CM | POA: Diagnosis not present

## 2024-05-08 DIAGNOSIS — R0609 Other forms of dyspnea: Secondary | ICD-10-CM

## 2024-05-08 DIAGNOSIS — I10 Essential (primary) hypertension: Secondary | ICD-10-CM

## 2024-05-08 NOTE — Patient Instructions (Addendum)
 Medication Instructions:  No changes *If you need a refill on your cardiac medications before your next appointment, please call your pharmacy*  Lab Work: No labs  Testing/Procedures: Your physician has requested that you have an echocardiogram. Echocardiography is a painless test that uses sound waves to create images of your heart. It provides your doctor with information about the size and shape of your heart and how well your heart's chambers and valves are working. This procedure takes approximately one hour. There are no restrictions for this procedure. Please do NOT wear cologne, perfume, aftershave, or lotions (deodorant is allowed). Please arrive 15 minutes prior to your appointment time.  Please note: We ask at that you not bring children with you during ultrasound (echo/ vascular) testing. Due to room size and safety concerns, children are not allowed in the ultrasound rooms during exams. Our front office staff cannot provide observation of children in our lobby area while testing is being conducted. An adult accompanying a patient to their appointment will only be allowed in the ultrasound room at the discretion of the ultrasound technician under special circumstances. We apologize for any inconvenience.  Follow-Up: At [facility], you and your health needs are our priority.  As part of our continuing mission to provide you with exceptional heart care, our providers are all part of one team.  This team includes your primary Cardiologist (physician) and Advanced Practice Providers or APPs (Physician Assistants and Nurse Practitioners) who all work together to provide you with the care you need, when you need it.  Your next appointment:   6 month(s)  Provider:   Redell Shallow, MD    Other instructions: Dorothy Wood- Long Term Monitor Instructions  Your physician has requested you wear a ZIO patch monitor for 14 days.  This is a single patch monitor. [employer] supplies one  patch monitor per enrollment. Additional stickers are not available. Please do not apply patch if you will be having a Nuclear Stress Test,  Echocardiogram, Cardiac CT, MRI, or Chest Xray during the period you would be wearing the  monitor. The patch cannot be worn during these tests. You cannot remove and re-apply the  ZIO XT patch monitor.  Your ZIO patch monitor will be mailed 3 day USPS to your address on file. It may take 3-5 days  to receive your monitor after you have been enrolled.  Once you have received your monitor, please review the enclosed instructions. Your monitor  has already been registered assigning a specific monitor serial # to you.  Billing and Patient Assistance Program Information  We have supplied [employer] with any of your insurance information on file for billing purposes. [employer] offers a sliding scale Patient Assistance Program for patients that do not have  insurance, or whose insurance does not completely cover the cost of the ZIO monitor.  You must apply for the Patient Assistance Program to qualify for this discounted rate.  To apply, please call [employer] at [phone], select option 4, select option 2, ask to apply for  Patient Assistance Program. Meredeth will ask your household income, and how many people  are in your household. They will quote your out-of-pocket cost based on that information.  [employer] will also be able to set up a 86-month, interest-free payment plan if needed.  Applying the monitor   Shave hair from upper left chest.  Hold abrader disc by orange tab. Rub abrader in 40 strokes over the upper left chest as  indicated in your monitor  instructions.  Clean area with 4 enclosed alcohol pads. Let dry.  Apply patch as indicated in monitor instructions. Patch will be placed under collarbone on left  side of chest with arrow pointing upward.  Rub patch adhesive wings for 2 minutes. Remove white label marked 1. Remove the white  label marked  2. Rub patch adhesive wings for 2 additional minutes.  While looking in a mirror, press and release button in center of patch. A small green light will  flash 3-4 times. This will be your only indicator that the monitor has been turned on.  Do not shower for the first 24 hours. You may shower after the first 24 hours.  Press the button if you feel a symptom. You will hear a small click. Record Date, Time and  Symptom in the Patient Logbook.  When you are ready to remove the patch, follow instructions on the last 2 pages of Patient  Logbook. Stick patch monitor onto the last page of Patient Logbook.  Place Patient Logbook in the blue and white box. Use locking tab on box and tape box closed  securely. The blue and white box has prepaid postage on it. Please place it in the mailbox as  soon as possible. Your physician should have your test results approximately 7 days after the  monitor has been mailed back to [facility].  Call [facility] Customer Care at [phone] if you have questions regarding  your ZIO XT patch monitor. Call them immediately if you see an orange light blinking on your  monitor.  If your monitor falls off in less than 4 days, contact our Monitor department at [phone].  If your monitor becomes loose or falls off after 4 days call [employer] at [phone] for  suggestions on securing your monitor

## 2024-05-08 NOTE — Progress Notes (Unsigned)
Enrolled patient for a 14 day Zio XT monitor to be mailed to patients home  [place] to read

## 2024-05-09 ENCOUNTER — Other Ambulatory Visit (HOSPITAL_COMMUNITY): Payer: Self-pay

## 2024-05-10 ENCOUNTER — Ambulatory Visit (HOSPITAL_COMMUNITY)
Admission: RE | Admit: 2024-05-10 | Discharge: 2024-05-10 | Disposition: A | Discharge status: Home / Self Care | Source: Ambulatory Visit | Attending: Neurological Surgery | Admitting: Neurological Surgery

## 2024-05-10 ENCOUNTER — Other Ambulatory Visit: Payer: Self-pay

## 2024-05-10 DIAGNOSIS — M47812 Spondylosis without myelopathy or radiculopathy, cervical region: Secondary | ICD-10-CM | POA: Diagnosis not present

## 2024-05-10 DIAGNOSIS — M4802 Spinal stenosis, cervical region: Secondary | ICD-10-CM | POA: Diagnosis not present

## 2024-05-10 DIAGNOSIS — Z9889 Other specified postprocedural states: Secondary | ICD-10-CM | POA: Diagnosis not present

## 2024-05-10 DIAGNOSIS — M5412 Radiculopathy, cervical region: Secondary | ICD-10-CM

## 2024-05-10 DIAGNOSIS — M4722 Other spondylosis with radiculopathy, cervical region: Secondary | ICD-10-CM | POA: Diagnosis not present

## 2024-05-10 MED ORDER — IOHEXOL 300 MG/ML  SOLN
10.0000 mL | Freq: Once | INTRAMUSCULAR | Status: AC | PRN
  Administered 2024-05-10: 10 mL via INTRATHECAL

## 2024-05-10 MED ORDER — DIAZEPAM 5 MG PO TABS: 10.0000 mg | ORAL_TABLET | Freq: Once | ORAL | Status: AC

## 2024-05-10 MED ORDER — DIAZEPAM 5 MG PO TABS
ORAL_TABLET | ORAL | Status: AC
  Administered 2024-05-10: 10 mg via ORAL
  Filled 2024-05-10: qty 2

## 2024-05-10 MED ORDER — ACETAMINOPHEN 325 MG PO TABS: 650.0000 mg | ORAL_TABLET | ORAL | Status: DC | PRN

## 2024-05-10 MED ORDER — LIDOCAINE HCL (PF) 1 % IJ SOLN
5.0000 mL | Freq: Once | INTRAMUSCULAR | Status: AC
  Administered 2024-05-10: 5 mL via INTRADERMAL

## 2024-05-10 NOTE — Procedures (Signed)
 Dorothy Wood is a 63 year old individual who has had significant cervical spondylosis in the past having had a previous anterior decompression  with  arthroplasty at C5-6 and at C6-C7.  As she is having additional radicular symptoms either in a C8 or C6 distribution and because of the nature of the arthroplasty substantial artifact is created by the device itself a myelogram and postmyelogram CAT scan is now being performed.  Pre op Dx: Cervical spondylosis with radiculopathy status post arthroplasty C5-6 C6-C7. Post op Dx: Same Procedure: Cervical myelogram Surgeon: Ryleah Miramontes Puncture level: L3-4 Fluid color: Clear colorless Injection: Iohexol  300, 6 cc Findings: Moderate spondylosis at the C4-5 level above arthroplasty no obvious nerve root cutoff further evaluation with CT scanning.

## 2024-05-31 ENCOUNTER — Other Ambulatory Visit (HOSPITAL_COMMUNITY): Payer: Self-pay

## 2024-05-31 ENCOUNTER — Other Ambulatory Visit: Payer: Self-pay | Admitting: Family Medicine

## 2024-05-31 ENCOUNTER — Other Ambulatory Visit: Payer: Self-pay

## 2024-05-31 MED ORDER — PRAMIPEXOLE DIHYDROCHLORIDE 0.25 MG PO TABS
0.2500 mg | ORAL_TABLET | Freq: Three times a day (TID) | ORAL | 0 refills | Status: DC
  Filled 2024-05-31: qty 90, 30d supply, fill #0

## 2024-05-31 MED ORDER — LEVOTHYROXINE SODIUM 88 MCG PO TABS
88.0000 ug | ORAL_TABLET | Freq: Every day | ORAL | 3 refills | Status: AC
  Filled 2024-05-31: qty 90, 90d supply, fill #0

## 2024-06-07 DIAGNOSIS — R002 Palpitations: Secondary | ICD-10-CM | POA: Diagnosis not present

## 2024-06-08 ENCOUNTER — Ambulatory Visit (HOSPITAL_COMMUNITY)
Admission: RE | Admit: 2024-06-08 | Discharge: 2024-06-08 | Disposition: A | Discharge status: Home / Self Care | Source: Ambulatory Visit | Attending: Cardiology | Admitting: Cardiology

## 2024-06-08 ENCOUNTER — Ambulatory Visit: Payer: Self-pay | Admitting: Cardiology

## 2024-06-08 ENCOUNTER — Encounter (HOSPITAL_COMMUNITY): Payer: Self-pay

## 2024-06-08 DIAGNOSIS — I10 Essential (primary) hypertension: Secondary | ICD-10-CM

## 2024-06-08 DIAGNOSIS — R002 Palpitations: Secondary | ICD-10-CM | POA: Diagnosis not present

## 2024-06-08 LAB — ECHOCARDIOGRAM COMPLETE
AR max vel: 2.58 cm2
AV Area VTI: 2.65 cm2
AV Area mean vel: 2.59 cm2
AV Mean grad: 2 mmHg
AV Peak grad: 3.7 mmHg
Ao pk vel: 0.96 m/s
Area-P 1/2: 4.41 cm2
S' Lateral: 2.1 cm

## 2024-06-19 ENCOUNTER — Encounter: Payer: Self-pay | Admitting: Cardiology

## 2024-06-19 DIAGNOSIS — R0609 Other forms of dyspnea: Secondary | ICD-10-CM

## 2024-07-01 ENCOUNTER — Other Ambulatory Visit (HOSPITAL_COMMUNITY): Payer: Self-pay

## 2024-07-01 ENCOUNTER — Other Ambulatory Visit: Payer: Self-pay | Admitting: Family Medicine

## 2024-07-02 ENCOUNTER — Other Ambulatory Visit (HOSPITAL_COMMUNITY): Payer: Self-pay

## 2024-07-03 ENCOUNTER — Other Ambulatory Visit (HOSPITAL_COMMUNITY): Payer: Self-pay

## 2024-07-03 MED ORDER — PROPRANOLOL HCL ER 60 MG PO CP24
60.0000 mg | ORAL_CAPSULE | Freq: Every morning | ORAL | 0 refills | Status: DC
  Filled 2024-07-03: qty 30, 30d supply, fill #0

## 2024-07-03 NOTE — Telephone Encounter (Signed)
 Called Pt to schedule appt, No answer

## 2024-07-06 ENCOUNTER — Other Ambulatory Visit (HOSPITAL_COMMUNITY): Payer: Self-pay

## 2024-07-06 ENCOUNTER — Other Ambulatory Visit: Payer: Self-pay

## 2024-07-06 MED ORDER — ATOMOXETINE HCL 60 MG PO CAPS
60.0000 mg | ORAL_CAPSULE | Freq: Every day | ORAL | 2 refills | Status: AC
  Filled 2024-07-06: qty 30, 30d supply, fill #0

## 2024-07-10 ENCOUNTER — Ambulatory Visit (HOSPITAL_COMMUNITY)
Admission: RE | Admit: 2024-07-10 | Discharge: 2024-07-10 | Disposition: A | Discharge status: Home / Self Care | Source: Ambulatory Visit | Attending: Cardiology | Admitting: Cardiology

## 2024-07-10 DIAGNOSIS — R0609 Other forms of dyspnea: Secondary | ICD-10-CM | POA: Diagnosis not present

## 2024-07-10 LAB — POCT I-STAT CREATININE: Creatinine, Ser: 0.7 mg/dL (ref 0.44–1.00)

## 2024-07-10 MED ORDER — IOHEXOL 350 MG/ML SOLN
75.0000 mL | Freq: Once | INTRAVENOUS | Status: AC | PRN
  Administered 2024-07-10: 75 mL via INTRAVENOUS

## 2024-07-11 ENCOUNTER — Ambulatory Visit: Payer: Self-pay | Admitting: Cardiology

## 2024-07-13 ENCOUNTER — Other Ambulatory Visit (HOSPITAL_COMMUNITY): Payer: Self-pay

## 2024-07-13 ENCOUNTER — Ambulatory Visit: Payer: Self-pay

## 2024-07-13 ENCOUNTER — Other Ambulatory Visit: Payer: Self-pay

## 2024-07-13 ENCOUNTER — Encounter: Payer: Self-pay | Admitting: Family Medicine

## 2024-07-13 DIAGNOSIS — F330 Major depressive disorder, recurrent, mild: Secondary | ICD-10-CM | POA: Diagnosis not present

## 2024-07-13 DIAGNOSIS — F419 Anxiety disorder, unspecified: Secondary | ICD-10-CM | POA: Diagnosis not present

## 2024-07-13 DIAGNOSIS — F4325 Adjustment disorder with mixed disturbance of emotions and conduct: Secondary | ICD-10-CM | POA: Diagnosis not present

## 2024-07-13 DIAGNOSIS — F902 Attention-deficit hyperactivity disorder, combined type: Secondary | ICD-10-CM | POA: Diagnosis not present

## 2024-07-13 MED ORDER — ATOMOXETINE HCL 80 MG PO CAPS
80.0000 mg | ORAL_CAPSULE | Freq: Every day | ORAL | 2 refills | Status: AC
  Filled 2024-07-13 – 2024-08-08 (×2): qty 30, 30d supply, fill #0
  Filled 2024-09-15: qty 30, 30d supply, fill #1

## 2024-07-13 MED ORDER — BUPROPION HCL ER (XL) 300 MG PO TB24
300.0000 mg | ORAL_TABLET | Freq: Every morning | ORAL | 1 refills | Status: AC
  Filled 2024-09-26: qty 90, 90d supply, fill #0

## 2024-07-13 MED ORDER — SERTRALINE HCL 100 MG PO TABS: 100.0000 mg | ORAL_TABLET | Freq: Every day | ORAL | 1 refills | Status: AC

## 2024-07-13 MED ORDER — GUANFACINE HCL 2 MG PO TABS
2.0000 mg | ORAL_TABLET | Freq: Two times a day (BID) | ORAL | 1 refills | Status: AC
  Filled 2024-07-13: qty 180, 90d supply, fill #0

## 2024-07-13 NOTE — Telephone Encounter (Signed)
 FYI Only or Action Required?: FYI only for provider: new patient.  Patient is followed in Pulmonology for SOB, last seen on new patient.  Called Nurse Triage reporting elevated heart rate and Shortness of Breath.  Symptoms began several months ago.  Interventions attempted: Other: rest.  Symptoms are: unchanged.  Triage Disposition: See PCP Within 2 Weeks  Patient/caregiver understands and will follow disposition?: Yes  Patient/caregiver understands and will follow disposition?: Yes  Copied from CRM [id number]. Topic: Clinical - Red Word Triage >> Jul 13, 2024 11:04 AM Nathanel DEL wrote: Red Word that prompted transfer to Nurse Triage: SOB, looking for new pulm, pulm fibrosis runs in family, fast heat rate (140) Reason for Disposition  [1] MILD longstanding difficulty breathing (e.g., minimal/no SOB at rest, SOB with walking, pulse < 100) AND [2] SAME as normal  Answer Assessment - Initial Assessment Questions Patient not experiencing symptoms at time of call Patient's mother has pulmonary fibrosis, concern that she may be experiencing symptoms.  Already had cardiology work up   1. RESPIRATORY STATUS: Describe your breathing? (e.g., wheezing, shortness of breath, unable to speak, severe coughing)      Short of breath with minimal exertion 2. ONSET: When did this breathing problem begin?      About two months 3. PATTERN Does the difficult breathing come and go, or has it been constant since it started?      Only present with mild activity 4. SEVERITY: How bad is your breathing? (e.g., mild, moderate, severe)      Feel short of breath, but resolves after a few minutes of rest 5. RECURRENT SYMPTOM: Have you had difficulty breathing before? If Yes, ask: When was the last time? and What happened that time?      denies 6. CARDIAC HISTORY: Do you have any history of heart disease? (e.g., heart attack, angina, bypass surgery, angioplasty)      denies 7. LUNG HISTORY: Do  you have any history of lung disease?  (e.g., pulmonary embolus, asthma, emphysema)     denies 8. CAUSE: What do you think is causing the breathing problem?      Concern for pulmonary fibrosis, but unsure 9. OTHER SYMPTOMS: Do you have any other symptoms? (e.g., chest pain, cough, dizziness, fever, runny nose)     Elevated HR to 140's 10. O2 SATURATION MONITOR:  Do you use an oxygen saturation monitor (pulse oximeter) at home? If Yes, ask: What is your reading (oxygen level) today? What is your usual oxygen saturation reading? (e.g., 95%)       Always around 99%, even when feeling short of breath  Protocols used: Breathing Difficulty-A-AH

## 2024-07-14 ENCOUNTER — Other Ambulatory Visit (HOSPITAL_COMMUNITY): Payer: Self-pay

## 2024-07-14 ENCOUNTER — Ambulatory Visit (INDEPENDENT_AMBULATORY_CARE_PROVIDER_SITE_OTHER): Admitting: Pulmonary Disease

## 2024-07-14 ENCOUNTER — Other Ambulatory Visit: Payer: Self-pay

## 2024-07-14 ENCOUNTER — Encounter (HOSPITAL_BASED_OUTPATIENT_CLINIC_OR_DEPARTMENT_OTHER): Payer: Self-pay | Admitting: Pulmonary Disease

## 2024-07-14 ENCOUNTER — Encounter: Payer: Self-pay | Admitting: Pharmacist

## 2024-07-14 VITALS — BP 129/78 | HR 85 | Ht 65.0 in | Wt 142.0 lb

## 2024-07-14 DIAGNOSIS — R0609 Other forms of dyspnea: Secondary | ICD-10-CM

## 2024-07-14 DIAGNOSIS — M47812 Spondylosis without myelopathy or radiculopathy, cervical region: Secondary | ICD-10-CM | POA: Diagnosis not present

## 2024-07-14 DIAGNOSIS — I471 Supraventricular tachycardia, unspecified: Secondary | ICD-10-CM | POA: Diagnosis not present

## 2024-07-14 NOTE — Progress Notes (Signed)
 Subjective:    Patient ID: Dorothy Wood, female    DOB: Mar 06, 1961, 63 y.o.   MRN: [id number]  Discussed the use of AI scribe software for clinical note transcription with the patient, who gave verbal consent to proceed.  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 She was referred by Dr. Joyce for evaluation of her shortness of breath.  She experiences shortness of breath primarily during exertion, such as yard work, walking her dog, or carrying groceries, with heart rates reaching up to 145 bpm. Shortness of breath also occurs with minimal exertion, such as bending down or walking to the bathroom at night, with heart rates up to 158 bpm.  A CT scan for neck issues incidentally revealed mild scarring on the upper lobes of her lungs, stable compared to a previous scan from 2022. She is concerned about this finding due to her mother's history of idiopathic pulmonary fibrosis.  Her past medical history includes gastroparesis and Sjogren's syndrome, for which she takes pilocarpine . She also takes Strattera  for ADHD and propranolol  for anxiety. She has discontinued Ritalin  due to concerns about gastroparesis.  A cardiology evaluation, including a 2D echocardiogram and a two-week heart monitor, showed a slightly leaky valve and episodes of elevated heart rate during exertion, with four short runs of SVT.  She denies smoking, asthma, or significant exposure to environmental lung irritants. She reports a recent increase in physical activity, walking three-quarters of a mile twice daily, but had a period of decreased activity while caring for her husband. No chest pain or wheezing. Occasional morning cough.  Reviewed cards eval   Significant tests/ events reviewed  CT chest 06/2024 minimal stable LUL scarring, compared to 2022 CT angio 2022 nml 05/2024 echo >> NMl LV, RV, bubble study neg 03/2021  Past Medical History:   Diagnosis Date   Arthritis    BRCA gene mutation negative    Cancer (HCC)    OVARIAN   Colonic polyp    Depression    Gastritis    History of colonic polyps 04/11/2013   IMO SNOMED Dx Update Oct 2024     Hyperlipidemia    Hypertension    Hypothyroidism    Sjogren's disease    Stroke [facility])    Thyroid  disease    HYPOTHYROIDISM   TIA (transient ischemic attack) 04/06/2021    Past Surgical History:  Procedure Laterality Date   ABDOMINAL HYSTERECTOMY     Ovarian cancer   BIOPSY  02/04/2021   Procedure: BIOPSY;  Surgeon: Donnald Charleston, MD;  Location: [facility];  Service: Endoscopy;;   BREAST CYST ASPIRATION     CESAREAN SECTION     x2   CHOLECYSTECTOMY N/A 01/23/2022   Procedure: LAPAROSCOPIC CHOLECYSTECTOMY WITH INTRAOPERATIVE CHOLANGIOGRAM;  Surgeon: Eletha Boas, MD;  Location: [facility];  Service: General;  Laterality: N/A;   COLONOSCOPY  10-06   Dr. Cecilie   ESOPHAGOGASTRODUODENOSCOPY N/A 02/04/2021   Procedure: ESOPHAGOGASTRODUODENOSCOPY (EGD);  Surgeon: Donnald Charleston, MD;  Location: [facility];  Service: Endoscopy;  Laterality: N/A;   TONSILLECTOMY       Allergies  Allergen Reactions   Reglan  [Metoclopramide ] Shortness Of Breath   Prochlorperazine      Other Reaction(s): Long QT   Promethazine  Hcl     Other Reaction(s): Long QT    Social History   Socioeconomic History   Marital status: Widowed    Spouse name: Not on file  Number of children: 2   Years of education: Not on file   Highest education level: Not on file  Occupational History   Not on file  Tobacco Use   Smoking status: Never   Smokeless tobacco: Never  Vaping Use   Vaping status: Never Used  Substance and Sexual Activity   Alcohol use: Yes    Alcohol/week: 4.0 standard drinks of alcohol    Types: 4 Glasses of wine per week    Comment: 05/21/21 1 glasses wine 3 x week   Drug use: No   Sexual activity: Not Currently  Other Topics Concern   Not on file  Social History Narrative    Lives with spouse   Social Drivers of Health   Financial Resource Strain: Not on file  Food Insecurity: No Food Insecurity (01/27/2022)   Hunger Vital Sign    Worried About Running Out of Food in the Last Year: Never true    Ran Out of Food in the Last Year: Never true  Transportation Needs: No Transportation Needs (01/27/2022)   PRAPARE - Administrator, Civil Service (Medical): No    Lack of Transportation (Non-Medical): No  Physical Activity: Not on file  Stress: Not on file  Social Connections: Not on file  Intimate Partner Violence: Not on file    Family History  Problem Relation Age of Onset   Mental illness Mother    Hypertension Mother    Heart failure Mother    CAD Father        CABG at age 46   Mental illness Sister    Alcoholism Sister    Mental illness Maternal Uncle    Mental illness Maternal Grandmother    Cancer Paternal Grandmother    Mental illness Brother    Alcoholism Brother    Breast cancer Neg Hx    BRCA 1/2 Neg Hx       Review of Systems  neg for any significant sore throat, dysphagia, itching, sneezing, nasal congestion or excess/ purulent secretions, fever, chills, sweats, unintended wt loss, pleuritic or exertional cp, hempoptysis, orthopnea pnd or change in chronic leg swelling. Also denies presyncope, palpitations, heartburn, abdominal pain, nausea, vomiting, diarrhea or change in bowel or urinary habits, dysuria,hematuria, rash, arthralgias, visual complaints, headache, numbness weakness or ataxia.      Objective:   Physical Exam  Gen. Pleasant, well-nourished, in no distress ENT - no thrush, no pallor/icterus,no post nasal drip Neck: No JVD, no thyromegaly, no carotid bruits Lungs: no use of accessory muscles, no dullness to percussion, clear without rales or rhonchi  Cardiovascular: Rhythm regular, heart sounds  normal, no murmurs or gallops, no peripheral edema Musculoskeletal: No deformities, no cyanosis or clubbing         Assessment & Plan:   Assessment and Plan Assessment & Plan Exertional dyspnea and paroxysmal supraventricular tachycardia Reports episodes of exertional dyspnea and elevated heart rate, with heart rates reaching up to 158 bpm during activities such as walking the dog or carrying groceries. No associated drop in oxygen saturation. Echocardiogram and heart monitor results do not indicate significant cardiac pathology. Strattera  may contribute to increased heart rate. Deconditioning due to lifestyle changes is considered. Mild scarring in the upper lobes of the lungs is not contributing to symptoms. - Perform ambulatory saturation test to assess oxygen levels during activity >> HR did not rise significantly - Schedule pulmonary function tests (PFTs) to evaluate lung function. - Initiate a graduated exercise program to improve conditioning. -  Discuss medication regimen with primary care provider to evaluate the impact of noradrenergic drugs on symptoms. - If symptoms persist, proceed with cardiopulmonary exercise testing to differentiate between cardiac and pulmonary causes.

## 2024-07-14 NOTE — Patient Instructions (Addendum)
 X Ambulatory sat  X PFTs   VISIT SUMMARY: Today, we discussed your shortness of breath, which occurs mainly during physical activities like yard work, walking your dog, or carrying groceries. We reviewed your recent CT scan, which showed mild scarring on your lungs, and your cardiology evaluation, which indicated a slightly leaky valve and episodes of elevated heart rate during exertion. We also considered your medical history, including gastroparesis, Sjogren's syndrome, and your current medications.  YOUR PLAN: -EXERTIONAL DYSPNEA AND PAROXYSMAL SUPRAVENTRICULAR TACHYCARDIA: Exertional dyspnea means shortness of breath during physical activity, and paroxysmal supraventricular tachycardia (PSVT) is a condition where the heart suddenly beats much faster than normal. To address these issues, we will perform an ambulatory saturation test to check your oxygen levels during activity and schedule pulmonary function tests to evaluate your lung function. We recommend starting a graduated exercise program to improve your conditioning. Additionally, we will discuss your medication regimen with your primary care provider to see if any of your medications might be contributing to your symptoms. If your symptoms continue, we may proceed with cardiopulmonary exercise testing to determine if the cause is related to your heart or lungs.  INSTRUCTIONS: Please follow up with the ambulatory saturation test and pulmonary function tests as scheduled. Begin the graduated exercise program as recommended. Discuss your current medications with your primary care provider to evaluate their impact on your symptoms. If your symptoms persist, we will consider cardiopulmonary exercise testing to further investigate the cause.                      Contains text generated by [employer]bridge.                                 Contains text generated by Abridge.

## 2024-07-17 ENCOUNTER — Encounter: Payer: Self-pay | Admitting: Family Medicine

## 2024-07-17 ENCOUNTER — Encounter (HOSPITAL_BASED_OUTPATIENT_CLINIC_OR_DEPARTMENT_OTHER): Payer: Self-pay | Admitting: Pulmonary Disease

## 2024-07-17 DIAGNOSIS — G4733 Obstructive sleep apnea (adult) (pediatric): Secondary | ICD-10-CM

## 2024-07-17 NOTE — Telephone Encounter (Signed)
 Do we have a cancellation list for PFT?

## 2024-07-25 ENCOUNTER — Ambulatory Visit: Admitting: Family Medicine

## 2024-07-26 ENCOUNTER — Encounter: Payer: Self-pay | Admitting: Family Medicine

## 2024-07-26 ENCOUNTER — Telehealth (INDEPENDENT_AMBULATORY_CARE_PROVIDER_SITE_OTHER): Admitting: Family Medicine

## 2024-07-26 VITALS — Ht 65.0 in | Wt 142.0 lb

## 2024-07-26 DIAGNOSIS — G4733 Obstructive sleep apnea (adult) (pediatric): Secondary | ICD-10-CM

## 2024-07-26 NOTE — Progress Notes (Signed)
   Subjective:    Patient ID: Dorothy Wood, female    DOB: 12-15-1960, 63 y.o.   MRN: [id number]  HPI Documentation for virtual audio and video telecommunications through [facility] encounter:  The patient was located at home. 2 patient identifiers used.  The provider was located in the office. The patient did consent to this visit and is aware of possible charges through their insurance for this visit.  The other persons participating in this telemedicine service were none. Time spent on call was 5 minutes and in review of previous records >15 minutes total for counseling and coordination of care.  This virtual service is not related to other E/M service within previous 7 days.  Today's consultation revolves around OSA.  She does have a previous history of OSA with an AHI of 8 however she has noted recently witnessed snoring and apneic episodes as well as some shortness of breath and weakness.   She has seen her cardiologist concerning the shortness of breath and apparently the workup was negative for cardiac issues causing this.  Review of Systems     Objective:    Physical Exam Alert and in no distress otherwise not examined       Assessment & Plan:  OSA (obstructive sleep apnea) - Plan: Home sleep test I think it is reasonable to retest her since her symptoms seem to be worse.

## 2024-07-27 ENCOUNTER — Encounter: Payer: Self-pay | Admitting: Pharmacist

## 2024-07-27 ENCOUNTER — Other Ambulatory Visit: Payer: Self-pay

## 2024-07-27 ENCOUNTER — Other Ambulatory Visit (HOSPITAL_COMMUNITY): Payer: Self-pay

## 2024-07-27 ENCOUNTER — Other Ambulatory Visit: Payer: Self-pay | Admitting: Family Medicine

## 2024-07-27 MED ORDER — PRAMIPEXOLE DIHYDROCHLORIDE 0.25 MG PO TABS
0.2500 mg | ORAL_TABLET | Freq: Three times a day (TID) | ORAL | 3 refills | Status: AC
  Filled 2024-07-27 – 2024-08-08 (×2): qty 90, 30d supply, fill #0

## 2024-08-01 ENCOUNTER — Other Ambulatory Visit (HOSPITAL_COMMUNITY): Payer: Self-pay

## 2024-08-01 ENCOUNTER — Other Ambulatory Visit: Payer: Self-pay | Admitting: Family Medicine

## 2024-08-01 ENCOUNTER — Encounter: Payer: Self-pay | Admitting: Family Medicine

## 2024-08-01 ENCOUNTER — Other Ambulatory Visit: Payer: Self-pay

## 2024-08-01 MED ORDER — PROPRANOLOL HCL ER 60 MG PO CP24
60.0000 mg | ORAL_CAPSULE | Freq: Every morning | ORAL | 0 refills | Status: DC
  Filled 2024-08-01: qty 30, 30d supply, fill #0

## 2024-08-02 ENCOUNTER — Other Ambulatory Visit (HOSPITAL_COMMUNITY): Payer: Self-pay

## 2024-08-02 ENCOUNTER — Other Ambulatory Visit: Payer: Self-pay

## 2024-08-02 NOTE — Telephone Encounter (Signed)
 Sent message to referral coordinator to see if she can help.

## 2024-08-03 ENCOUNTER — Ambulatory Visit (INDEPENDENT_AMBULATORY_CARE_PROVIDER_SITE_OTHER): Admitting: Family Medicine

## 2024-08-03 ENCOUNTER — Encounter: Payer: Self-pay | Admitting: Family Medicine

## 2024-08-03 VITALS — BP 110/60 | HR 80 | Ht 65.0 in | Wt 139.2 lb

## 2024-08-03 DIAGNOSIS — Z23 Encounter for immunization: Secondary | ICD-10-CM

## 2024-08-03 DIAGNOSIS — M47812 Spondylosis without myelopathy or radiculopathy, cervical region: Secondary | ICD-10-CM

## 2024-08-03 DIAGNOSIS — G4733 Obstructive sleep apnea (adult) (pediatric): Secondary | ICD-10-CM

## 2024-08-03 DIAGNOSIS — R7302 Impaired glucose tolerance (oral): Secondary | ICD-10-CM | POA: Diagnosis not present

## 2024-08-03 DIAGNOSIS — E038 Other specified hypothyroidism: Secondary | ICD-10-CM | POA: Diagnosis not present

## 2024-08-03 DIAGNOSIS — I10 Essential (primary) hypertension: Secondary | ICD-10-CM

## 2024-08-03 DIAGNOSIS — M3500 Sicca syndrome, unspecified: Secondary | ICD-10-CM | POA: Diagnosis not present

## 2024-08-03 DIAGNOSIS — K219 Gastro-esophageal reflux disease without esophagitis: Secondary | ICD-10-CM

## 2024-08-03 DIAGNOSIS — F341 Dysthymic disorder: Secondary | ICD-10-CM

## 2024-08-03 DIAGNOSIS — Z0000 Encounter for general adult medical examination without abnormal findings: Secondary | ICD-10-CM

## 2024-08-03 DIAGNOSIS — K3184 Gastroparesis: Secondary | ICD-10-CM

## 2024-08-03 DIAGNOSIS — M5412 Radiculopathy, cervical region: Secondary | ICD-10-CM

## 2024-08-03 DIAGNOSIS — F988 Other specified behavioral and emotional disorders with onset usually occurring in childhood and adolescence: Secondary | ICD-10-CM

## 2024-08-03 DIAGNOSIS — Z79890 Hormone replacement therapy (postmenopausal): Secondary | ICD-10-CM

## 2024-08-03 DIAGNOSIS — Z8543 Personal history of malignant neoplasm of ovary: Secondary | ICD-10-CM

## 2024-08-03 DIAGNOSIS — E782 Mixed hyperlipidemia: Secondary | ICD-10-CM

## 2024-08-03 DIAGNOSIS — Z8673 Personal history of transient ischemic attack (TIA), and cerebral infarction without residual deficits: Secondary | ICD-10-CM

## 2024-08-03 DIAGNOSIS — Z860100 Personal history of colon polyps, unspecified: Secondary | ICD-10-CM

## 2024-08-03 LAB — POCT GLYCOSYLATED HEMOGLOBIN (HGB A1C): Hemoglobin A1C: 5.6 % (ref 4.0–5.6)

## 2024-08-03 NOTE — Progress Notes (Signed)
 Complete physical exam  Patient: Dorothy Wood   DOB: 09/20/1960   63 y.o. Female  MRN: [id number]  Subjective:    Chief Complaint  Patient presents with   Annual Exam    Fasting annual exam, no new concerns.     Dorothy Wood is a 63 y.o. female who presents today for a complete physical exam.  She reports consuming a general diet. Home exercise routine includes 1.5 miles daily.   She generally feels well. She reports sleeping well. Discussed the use of AI scribe software for clinical note transcription with the patient, who gave verbal consent to proceed.    She manages her Attention Deficit Disorder (ADD) with Strattera , currently taking 60 mg with plans to increase to 80 mg. She previously used Ritalin , which she liked but discontinued due to its negative impact on her gastroparesis.  Her depression is managed with Wellbutrin  and sertraline , prescribed by her nurse practitioner, Idell Remington. She has been on Wellbutrin  for a long time, and sertraline  was added later. She has a history of dysthymia and depression since age 60.  Sjogren's syndrome is managed with pilocarpine  (Salagen ) taken four times daily, which she has been on for approximately 30 years.  She has a history of ovarian cancer diagnosed at age 23, which was caught early. She also has a history of colon polyps and is on a five-year surveillance cycle for colonoscopy.  Propranolol  is taken for heart rate control, which also manages her blood pressure, eliminating the need for additional blood pressure medication. She was previously on hormone replacement therapy but discontinued it after a TIA.  She has a history of cervical spondylosis and underwent surgery three years ago to have two titanium plates placed in her neck. She now has bone spurs.  Her gastroparesis was diagnosed at age 2, presenting with symptoms of vomiting bile and prolonged recovery. She has tried various treatments, including Reglan , which had  adverse CNS effects.  She has sleep apnea and previously requested a sleep study, which was not approved by her insurance.  She has had a recent mammogram and bone density test, both of which were normal.      Most recent fall risk assessment:    12/09/2022    1:36 PM  Fall Risk   Falls in the past year? 0  Number falls in past yr: 0  Injury with Fall? 0  Risk for fall due to : No Fall Risks  Follow up Falls evaluation completed     Most recent depression screenings:    08/03/2024    8:30 AM 12/09/2022    1:37 PM  PHQ 2/9 Scores  PHQ - 2 Score 0 2  PHQ- 9 Score  8      Data saved with a previous flowsheet row definition    Vision:Within last year    Immunization History  Administered Date(s) Administered   Influenza Split 07/08/2022   Influenza,inj,Quad PF,6+ Mos 05/09/2014, 06/02/2019   Influenza-Unspecified 06/14/2014, 05/16/2015, 05/15/2018, 06/20/2018, 05/25/2020, 06/13/2021   PFIZER Comirnaty(Gray Top)Covid-19 Tri-Sucrose Vaccine 03/25/2021   PFIZER(Purple Top)SARS-COV-2 Vaccination 09/09/2019, 09/29/2019, 05/07/2020   Pfizer Covid-19 Vaccine Bivalent Booster 10yrs & up 07/02/2021   Tdap 05/09/2014, 09/14/2021   Zoster Recombinant(Shingrix ) 05/20/2022, 12/01/2022    Health Maintenance  Topic Date Due   Cervical Cancer Screening (HPV/Pap Cotest)  Never done   Pneumococcal Vaccine: 50+ Years (1 of 1 - PCV) Never done   Influenza Vaccine  04/14/2024   COVID-19 Vaccine (6 -  2025-26 season) 05/15/2024   Mammogram  02/09/2026   DTaP/Tdap/Td (3 - Td or Tdap) 09/15/2031   Colonoscopy  11/18/2032   Hepatitis C Screening  Completed   HIV Screening  Completed   Zoster Vaccines- Shingrix   Completed   Hepatitis B Vaccines 19-59 Average Risk  Aged Out   HPV VACCINES  Aged Out   Meningococcal B Vaccine  Aged Out    Patient Care Team: Joyce Norleen BROCKS, MD as PCP - General (Family Medicine) Pietro Redell RAMAN, MD as PCP - Cardiology (Cardiology) GI: Margarete Manas Optho: [facility]ist:[place] Neuro:Elsner, Rosemarie Ortho:[facility]  Outpatient Medications Prior to Visit  Medication Sig   aspirin  EC 81 MG tablet Take 81 mg by mouth every morning. Swallow whole.   atomoxetine  (STRATTERA ) 25 MG capsule Take 1 capsule (25 mg total) by mouth daily for 7 days, THEN take 2 capsules by mouth  (50 mg total) daily.   atomoxetine  (STRATTERA ) 60 MG capsule Take 1 capsule (60 mg total) by mouth daily.   atorvastatin  (LIPITOR) 80 MG tablet Take 1 tablet (80 mg total) by mouth daily.   buPROPion  (WELLBUTRIN  XL) 300 MG 24 hr tablet Take 1 tablet (300 mg total) by mouth every morning.   buPROPion  (WELLBUTRIN  XL) 300 MG 24 hr tablet Take 1 tablet (300 mg total) by mouth every morning.   colestipol  (COLESTID ) 1 g tablet Take 2 tablets (2 g total) by mouth daily.   ezetimibe  (ZETIA ) 10 MG tablet Take 1 tablet by mouth daily.   guanFACINE  (TENEX ) 2 MG tablet Take 1 tablet (2 mg total) by mouth 2 (two) times daily.   guanFACINE  (TENEX ) 2 MG tablet Take 1 tablet (2 mg total) by mouth 2 (two) times daily.   guanFACINE  (TENEX ) 2 MG tablet Take 1 tablet (2 mg total) by mouth 2 (two) times daily.   levothyroxine  (EUTHYROX ) 88 MCG tablet Take 1 tablet (88 mcg total) by mouth daily.   pilocarpine  (SALAGEN ) 5 MG tablet Take 1 tablet (5 mg total) by mouth 4 (four) times daily.   pramipexole  (MIRAPEX ) 0.25 MG tablet Take 1 tablet (0.25 mg total) by mouth 3 (three) times daily.   propranolol  ER (INDERAL  LA) 60 MG 24 hr capsule Take 1 capsule (60 mg) by mouth in the morning.   sertraline  (ZOLOFT ) 100 MG tablet Take 1 tablet (100 mg total) by mouth daily.   sertraline  (ZOLOFT ) 100 MG tablet Take 1 tablet (100 mg total) by mouth daily.   sertraline  (ZOLOFT ) 100 MG tablet Take 1 tablet (100 mg total) by mouth daily.   sertraline  (ZOLOFT ) 100 MG tablet Take 1 tablet (100 mg total) by mouth daily.   [DISCONTINUED] LORazepam  (ATIVAN ) 1 MG tablet Take 1 tablet (1 mg total) by mouth  3 (three) times daily as needed for anxiety.   atomoxetine  (STRATTERA ) 80 MG capsule Take 1 capsule (80 mg total) by mouth daily. (Patient not taking: Reported on 08/03/2024)   famotidine  (PEPCID ) 40 MG tablet Take 1 tablet (40 mg total) by mouth daily. (Patient not taking: Reported on 08/03/2024)   guanFACINE  (TENEX ) 2 MG tablet Take 1 tablet (2 mg total) by mouth 2 (two) times daily in the morning and at bedtime.   [DISCONTINUED] Methylphenidate  (COTEMPLA  XR-ODT) 17.3 MG TBED Take 1 tablet (17.3 mg total) by mouth in the morning. (Patient not taking: Reported on 07/26/2024)   [DISCONTINUED] methylphenidate  (RITALIN ) 20 MG tablet Take 1 tablet (20 mg total) by mouth 2 (two) times daily. (Patient not taking:  Reported on 07/26/2024)   [DISCONTINUED] methylphenidate  (RITALIN ) 20 MG tablet Take 1 tablet (20 mg total) by mouth 2 (two) times daily. (Patient not taking: Reported on 07/26/2024)   [DISCONTINUED] methylphenidate  54 MG PO CR tablet Take 1 tablet (54 mg total) by mouth 2 (two) times daily, morning and afternoon. (Patient not taking: Reported on 07/26/2024)   [DISCONTINUED] methylphenidate  54 MG PO CR tablet Take 1 tablet (54 mg total) by mouth 2 (two) times daily, morning and afternoon. (Patient not taking: Reported on 07/26/2024)   [DISCONTINUED] methylphenidate  54 MG PO CR tablet Take 1 tablet (54 mg total) by mouth 2 (two) times daily, morning and afternoon. (Patient not taking: Reported on 07/26/2024)   No facility-administered medications prior to visit.    Review of Systems  All other systems reviewed and are negative.   Family and social history as well as health maintenance and immunizations was reviewed.     Objective:    BP 110/60   Pulse 80   Ht 5' 5 (1.651 m)   Wt 139 lb 3.2 oz (63.1 kg)   BMI 23.16 kg/m    Physical Exam  Alert and in no distress. Tympanic membranes and canals are normal. Pharyngeal area is normal. Neck is supple without adenopathy or thyromegaly.  Cardiac exam shows a regular sinus rhythm without murmurs or gallops. Lungs are clear to auscultation. Hemoglobin A1c is 5.7     Assessment & Plan:    Discussed health benefits of physical activity, and encouraged her to engage in regular exercise appropriate for her age and condition.  Routine general medical examination at a health care facility  Attention deficit disorder, unspecified type  Dysthymia  Sjogren's syndrome, with unspecified organ involvement  History of ovarian cancer  History of colonic polyps  Other specified hypothyroidism  Essential hypertension  OSA (obstructive sleep apnea)  Cervical spondylosis without myelopathy  Mixed hyperlipidemia  Glucose intolerance (impaired glucose tolerance) - Plan: POCT glycosylated hemoglobin (Hb A1C)  Gastroesophageal reflux disease without esophagitis  History of TIA (transient ischemic attack)  Gastroparesis  Need for influenza vaccination - Plan: Flu vaccine trivalent PF, 6mos and older(Flulaval,Afluria,Fluarix,Fluzone)  Need for COVID-19 vaccine - Plan: Pfizer Comirnaty Covid-19 Vaccine 64yrs & older  At this point everything right now was status quo and doing well on her present medication regimen as well as follow-up with the appropriate specialist.  She will be seen here on an as-needed basis or in a year. Return in about 1 year (around 08/03/2025).      Norleen Jobs, MD

## 2024-08-09 ENCOUNTER — Other Ambulatory Visit: Payer: Self-pay

## 2024-08-09 ENCOUNTER — Other Ambulatory Visit (HOSPITAL_COMMUNITY): Payer: Self-pay

## 2024-08-12 ENCOUNTER — Other Ambulatory Visit (HOSPITAL_COMMUNITY): Payer: Self-pay

## 2024-09-15 ENCOUNTER — Other Ambulatory Visit: Payer: Self-pay

## 2024-09-15 ENCOUNTER — Other Ambulatory Visit (HOSPITAL_BASED_OUTPATIENT_CLINIC_OR_DEPARTMENT_OTHER): Payer: Self-pay | Admitting: Family

## 2024-09-15 ENCOUNTER — Other Ambulatory Visit (HOSPITAL_COMMUNITY): Payer: Self-pay

## 2024-09-15 ENCOUNTER — Other Ambulatory Visit: Payer: Self-pay | Admitting: Family Medicine

## 2024-09-15 MED ORDER — ATORVASTATIN CALCIUM 80 MG PO TABS
80.0000 mg | ORAL_TABLET | Freq: Every day | ORAL | 1 refills | Status: AC
  Filled 2024-09-15: qty 90, 90d supply, fill #0

## 2024-09-15 MED ORDER — EZETIMIBE 10 MG PO TABS
10.0000 mg | ORAL_TABLET | Freq: Every day | ORAL | 2 refills | Status: AC
  Filled 2024-09-15 – 2024-09-26 (×2): qty 90, 90d supply, fill #0

## 2024-09-18 ENCOUNTER — Other Ambulatory Visit: Payer: Self-pay

## 2024-09-26 ENCOUNTER — Other Ambulatory Visit: Payer: Self-pay | Admitting: Family Medicine

## 2024-09-26 ENCOUNTER — Other Ambulatory Visit: Payer: Self-pay

## 2024-09-26 ENCOUNTER — Other Ambulatory Visit (HOSPITAL_COMMUNITY): Payer: Self-pay

## 2024-09-26 MED ORDER — PROPRANOLOL HCL ER 60 MG PO CP24
60.0000 mg | ORAL_CAPSULE | Freq: Every morning | ORAL | 0 refills | Status: AC
  Filled 2024-09-26: qty 30, 30d supply, fill #0

## 2024-10-02 ENCOUNTER — Encounter (HOSPITAL_BASED_OUTPATIENT_CLINIC_OR_DEPARTMENT_OTHER)

## 2024-10-02 ENCOUNTER — Ambulatory Visit (HOSPITAL_BASED_OUTPATIENT_CLINIC_OR_DEPARTMENT_OTHER): Admitting: Pulmonary Disease

## 2024-10-12 ENCOUNTER — Ambulatory Visit (HOSPITAL_BASED_OUTPATIENT_CLINIC_OR_DEPARTMENT_OTHER): Admitting: Pulmonary Disease

## 2025-08-20 ENCOUNTER — Encounter: Admitting: Family Medicine
